# Patient Record
Sex: Female | Born: 1985 | Race: Black or African American | Hispanic: No | Marital: Married | Smoking: Former smoker
Health system: Southern US, Community
[De-identification: ages and names within clinical notes are randomized; demographics above are authoritative.]

## PROBLEM LIST (undated history)

## (undated) DIAGNOSIS — F32A Depression, unspecified: Secondary | ICD-10-CM

## (undated) DIAGNOSIS — D75A Glucose-6-phosphate dehydrogenase (G6PD) deficiency without anemia: Secondary | ICD-10-CM

## (undated) DIAGNOSIS — R51 Headache: Secondary | ICD-10-CM

## (undated) DIAGNOSIS — F4310 Post-traumatic stress disorder, unspecified: Secondary | ICD-10-CM

## (undated) DIAGNOSIS — F419 Anxiety disorder, unspecified: Secondary | ICD-10-CM

## (undated) DIAGNOSIS — G4730 Sleep apnea, unspecified: Secondary | ICD-10-CM

## (undated) DIAGNOSIS — K5790 Diverticulosis of intestine, part unspecified, without perforation or abscess without bleeding: Secondary | ICD-10-CM

## (undated) DIAGNOSIS — J45909 Unspecified asthma, uncomplicated: Secondary | ICD-10-CM

## (undated) DIAGNOSIS — Z8042 Family history of malignant neoplasm of prostate: Secondary | ICD-10-CM

## (undated) DIAGNOSIS — F329 Major depressive disorder, single episode, unspecified: Secondary | ICD-10-CM

## (undated) DIAGNOSIS — K589 Irritable bowel syndrome without diarrhea: Secondary | ICD-10-CM

## (undated) DIAGNOSIS — J209 Acute bronchitis, unspecified: Secondary | ICD-10-CM

## (undated) DIAGNOSIS — D497 Neoplasm of unspecified behavior of endocrine glands and other parts of nervous system: Secondary | ICD-10-CM

## (undated) DIAGNOSIS — D573 Sickle-cell trait: Secondary | ICD-10-CM

## (undated) DIAGNOSIS — R0602 Shortness of breath: Secondary | ICD-10-CM

## (undated) DIAGNOSIS — D649 Anemia, unspecified: Secondary | ICD-10-CM

## (undated) DIAGNOSIS — Z8489 Family history of other specified conditions: Secondary | ICD-10-CM

## (undated) DIAGNOSIS — D571 Sickle-cell disease without crisis: Secondary | ICD-10-CM

## (undated) DIAGNOSIS — D759 Disease of blood and blood-forming organs, unspecified: Secondary | ICD-10-CM

## (undated) DIAGNOSIS — Z803 Family history of malignant neoplasm of breast: Secondary | ICD-10-CM

## (undated) HISTORY — DX: Family history of malignant neoplasm of breast: Z80.3

## (undated) HISTORY — PX: TUMOR REMOVAL: SHX12

## (undated) HISTORY — DX: Family history of malignant neoplasm of prostate: Z80.42

## (undated) HISTORY — DX: Irritable bowel syndrome, unspecified: K58.9

## (undated) HISTORY — PX: EYE SURGERY: SHX253

## (undated) HISTORY — PX: BREAST SURGERY: SHX581

## (undated) HISTORY — PX: REDUCTION MAMMAPLASTY: SUR839

---

## 2010-08-04 DIAGNOSIS — D75A Glucose-6-phosphate dehydrogenase (G6PD) deficiency without anemia: Secondary | ICD-10-CM | POA: Insufficient documentation

## 2013-10-01 ENCOUNTER — Encounter (HOSPITAL_COMMUNITY): Payer: Self-pay | Admitting: Pharmacy Technician

## 2013-10-02 ENCOUNTER — Encounter (HOSPITAL_COMMUNITY): Payer: Self-pay

## 2013-10-02 ENCOUNTER — Encounter (HOSPITAL_COMMUNITY)
Admission: RE | Admit: 2013-10-02 | Discharge: 2013-10-02 | Disposition: A | Discharge status: Home / Self Care | Payer: Medicaid Other | Source: Ambulatory Visit | Attending: Otolaryngology | Admitting: Otolaryngology

## 2013-10-02 ENCOUNTER — Ambulatory Visit (HOSPITAL_COMMUNITY)
Admission: RE | Admit: 2013-10-02 | Discharge: 2013-10-02 | Disposition: A | Discharge status: Home / Self Care | Payer: Medicaid Other | Source: Ambulatory Visit | Attending: Anesthesiology | Admitting: Anesthesiology

## 2013-10-02 DIAGNOSIS — F411 Generalized anxiety disorder: Secondary | ICD-10-CM | POA: Diagnosis not present

## 2013-10-02 DIAGNOSIS — F329 Major depressive disorder, single episode, unspecified: Secondary | ICD-10-CM | POA: Diagnosis not present

## 2013-10-02 DIAGNOSIS — Z87891 Personal history of nicotine dependence: Secondary | ICD-10-CM | POA: Diagnosis not present

## 2013-10-02 DIAGNOSIS — Z01818 Encounter for other preprocedural examination: Secondary | ICD-10-CM | POA: Diagnosis not present

## 2013-10-02 DIAGNOSIS — J45909 Unspecified asthma, uncomplicated: Secondary | ICD-10-CM | POA: Insufficient documentation

## 2013-10-02 DIAGNOSIS — F3289 Other specified depressive episodes: Secondary | ICD-10-CM | POA: Diagnosis not present

## 2013-10-02 DIAGNOSIS — D571 Sickle-cell disease without crisis: Secondary | ICD-10-CM | POA: Diagnosis not present

## 2013-10-02 DIAGNOSIS — F4310 Post-traumatic stress disorder, unspecified: Secondary | ICD-10-CM | POA: Insufficient documentation

## 2013-10-02 HISTORY — DX: Anxiety disorder, unspecified: F41.9

## 2013-10-02 HISTORY — DX: Depression, unspecified: F32.A

## 2013-10-02 HISTORY — DX: Acute bronchitis, unspecified: J20.9

## 2013-10-02 HISTORY — DX: Diverticulosis of intestine, part unspecified, without perforation or abscess without bleeding: K57.90

## 2013-10-02 HISTORY — DX: Unspecified asthma, uncomplicated: J45.909

## 2013-10-02 HISTORY — DX: Anemia, unspecified: D64.9

## 2013-10-02 HISTORY — DX: Family history of other specified conditions: Z84.89

## 2013-10-02 HISTORY — DX: Major depressive disorder, single episode, unspecified: F32.9

## 2013-10-02 HISTORY — DX: Headache: R51

## 2013-10-02 HISTORY — DX: Sleep apnea, unspecified: G47.30

## 2013-10-02 HISTORY — DX: Post-traumatic stress disorder, unspecified: F43.10

## 2013-10-02 HISTORY — DX: Shortness of breath: R06.02

## 2013-10-02 HISTORY — DX: Disease of blood and blood-forming organs, unspecified: D75.9

## 2013-10-02 LAB — CBC
HCT: 37.1 % (ref 36.0–46.0)
Hemoglobin: 12.7 g/dL (ref 12.0–15.0)
MCH: 29.5 pg (ref 26.0–34.0)
MCHC: 34.2 g/dL (ref 30.0–36.0)
MCV: 86.1 fL (ref 78.0–100.0)
PLATELETS: 278 10*3/uL (ref 150–400)
RBC: 4.31 MIL/uL (ref 3.87–5.11)
RDW: 12.6 % (ref 11.5–15.5)
WBC: 7.1 10*3/uL (ref 4.0–10.5)

## 2013-10-02 LAB — HCG, SERUM, QUALITATIVE: Preg, Serum: NEGATIVE

## 2013-10-02 LAB — BASIC METABOLIC PANEL
Anion gap: 15 (ref 5–15)
BUN: 11 mg/dL (ref 6–23)
CALCIUM: 9.7 mg/dL (ref 8.4–10.5)
CO2: 22 meq/L (ref 19–32)
CREATININE: 0.83 mg/dL (ref 0.50–1.10)
Chloride: 102 mEq/L (ref 96–112)
GFR calc Af Amer: >90 mL/min (ref >90)
GFR calc non Af Amer: >90 mL/min (ref >90)
Glucose, Bld: 94 mg/dL (ref 70–99)
Potassium: 4.2 mEq/L (ref 3.7–5.3)
Sodium: 139 mEq/L (ref 137–147)

## 2013-10-02 IMAGING — CR DG CHEST 2V
2 series · 2 of 2 positions shown · non-contrast
Comparison: None.

CLINICAL DATA: Pre-op for tonsillectomy. History of sickle cell
anemia and asthma.

EXAM:
CHEST  2 VIEW

[w chest pa]
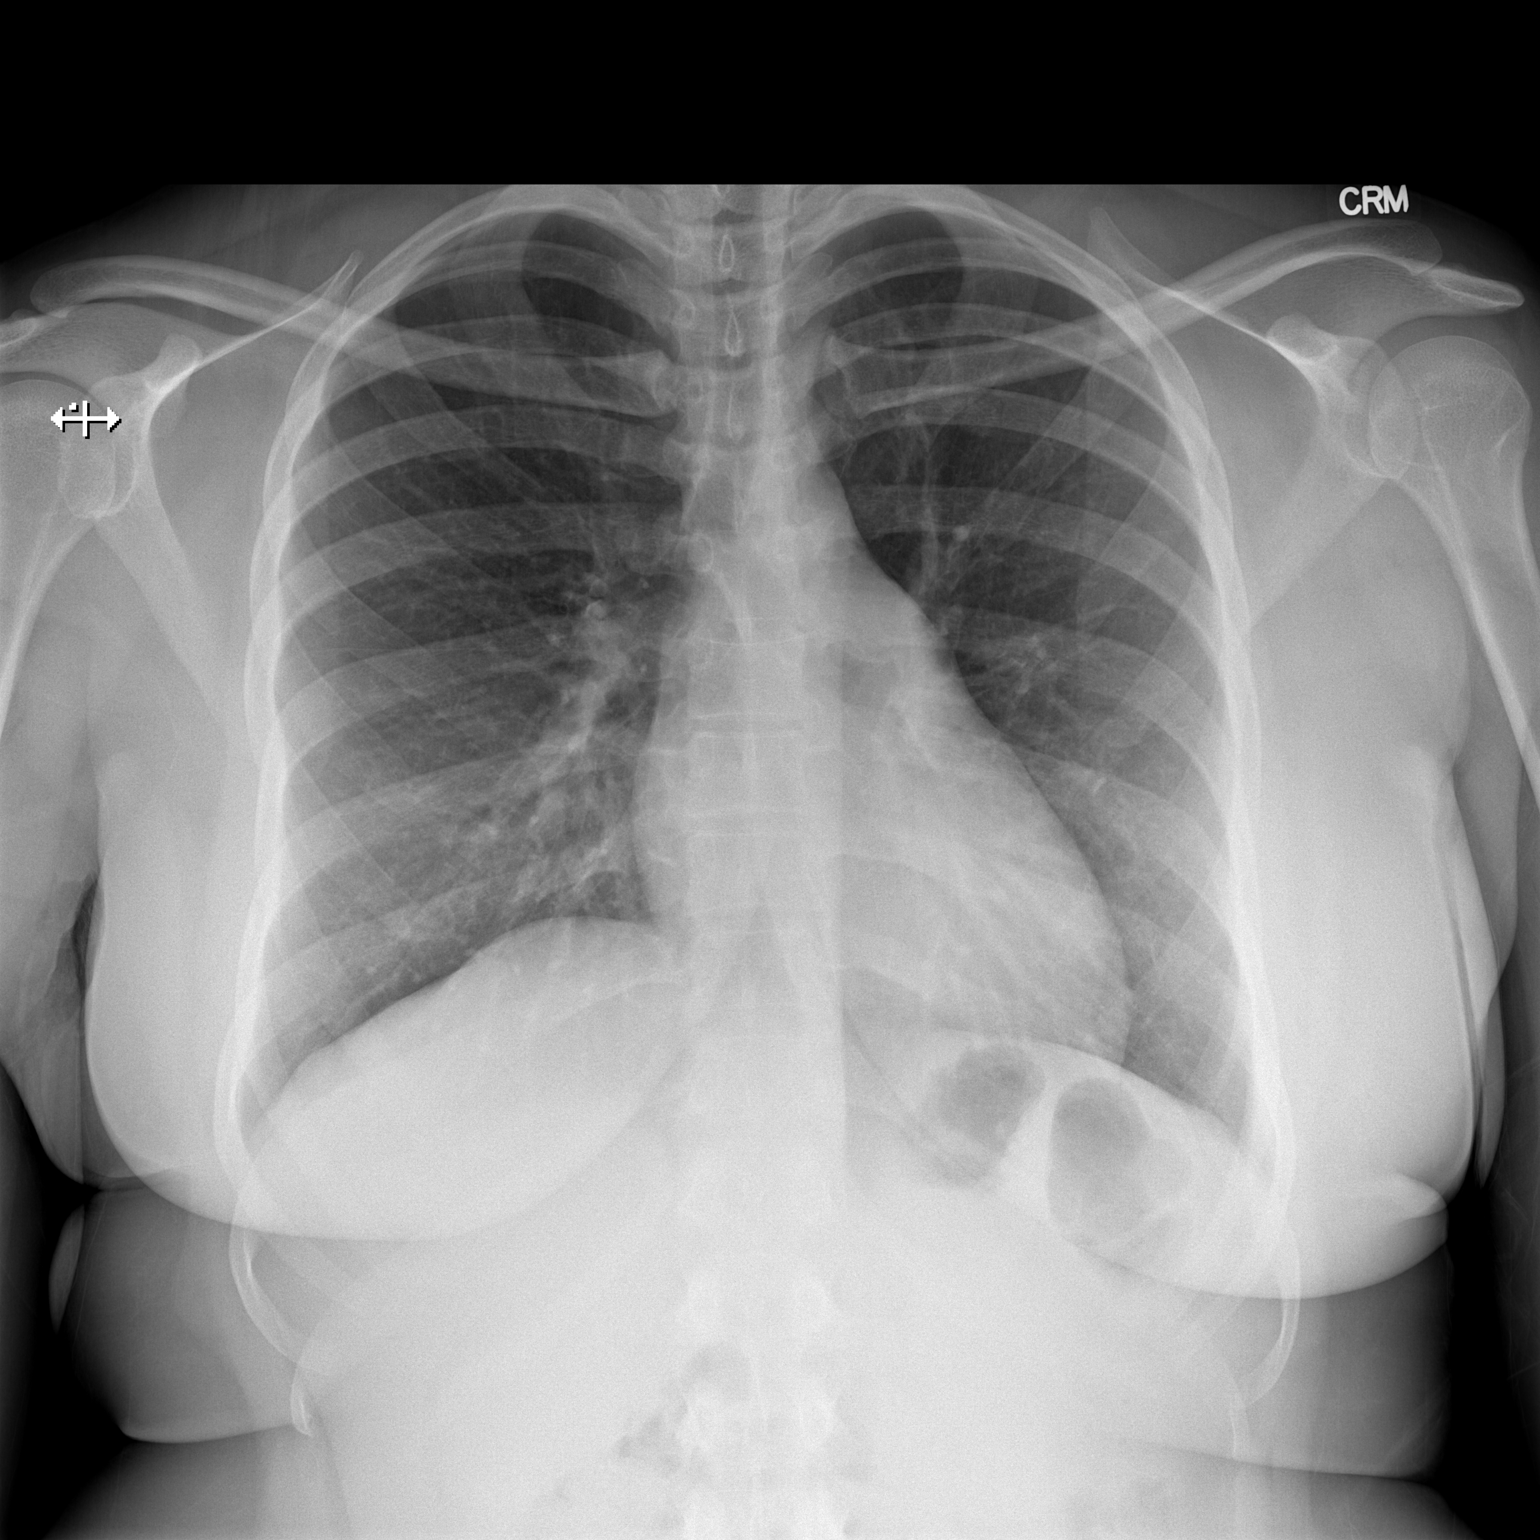

[w chest lat]
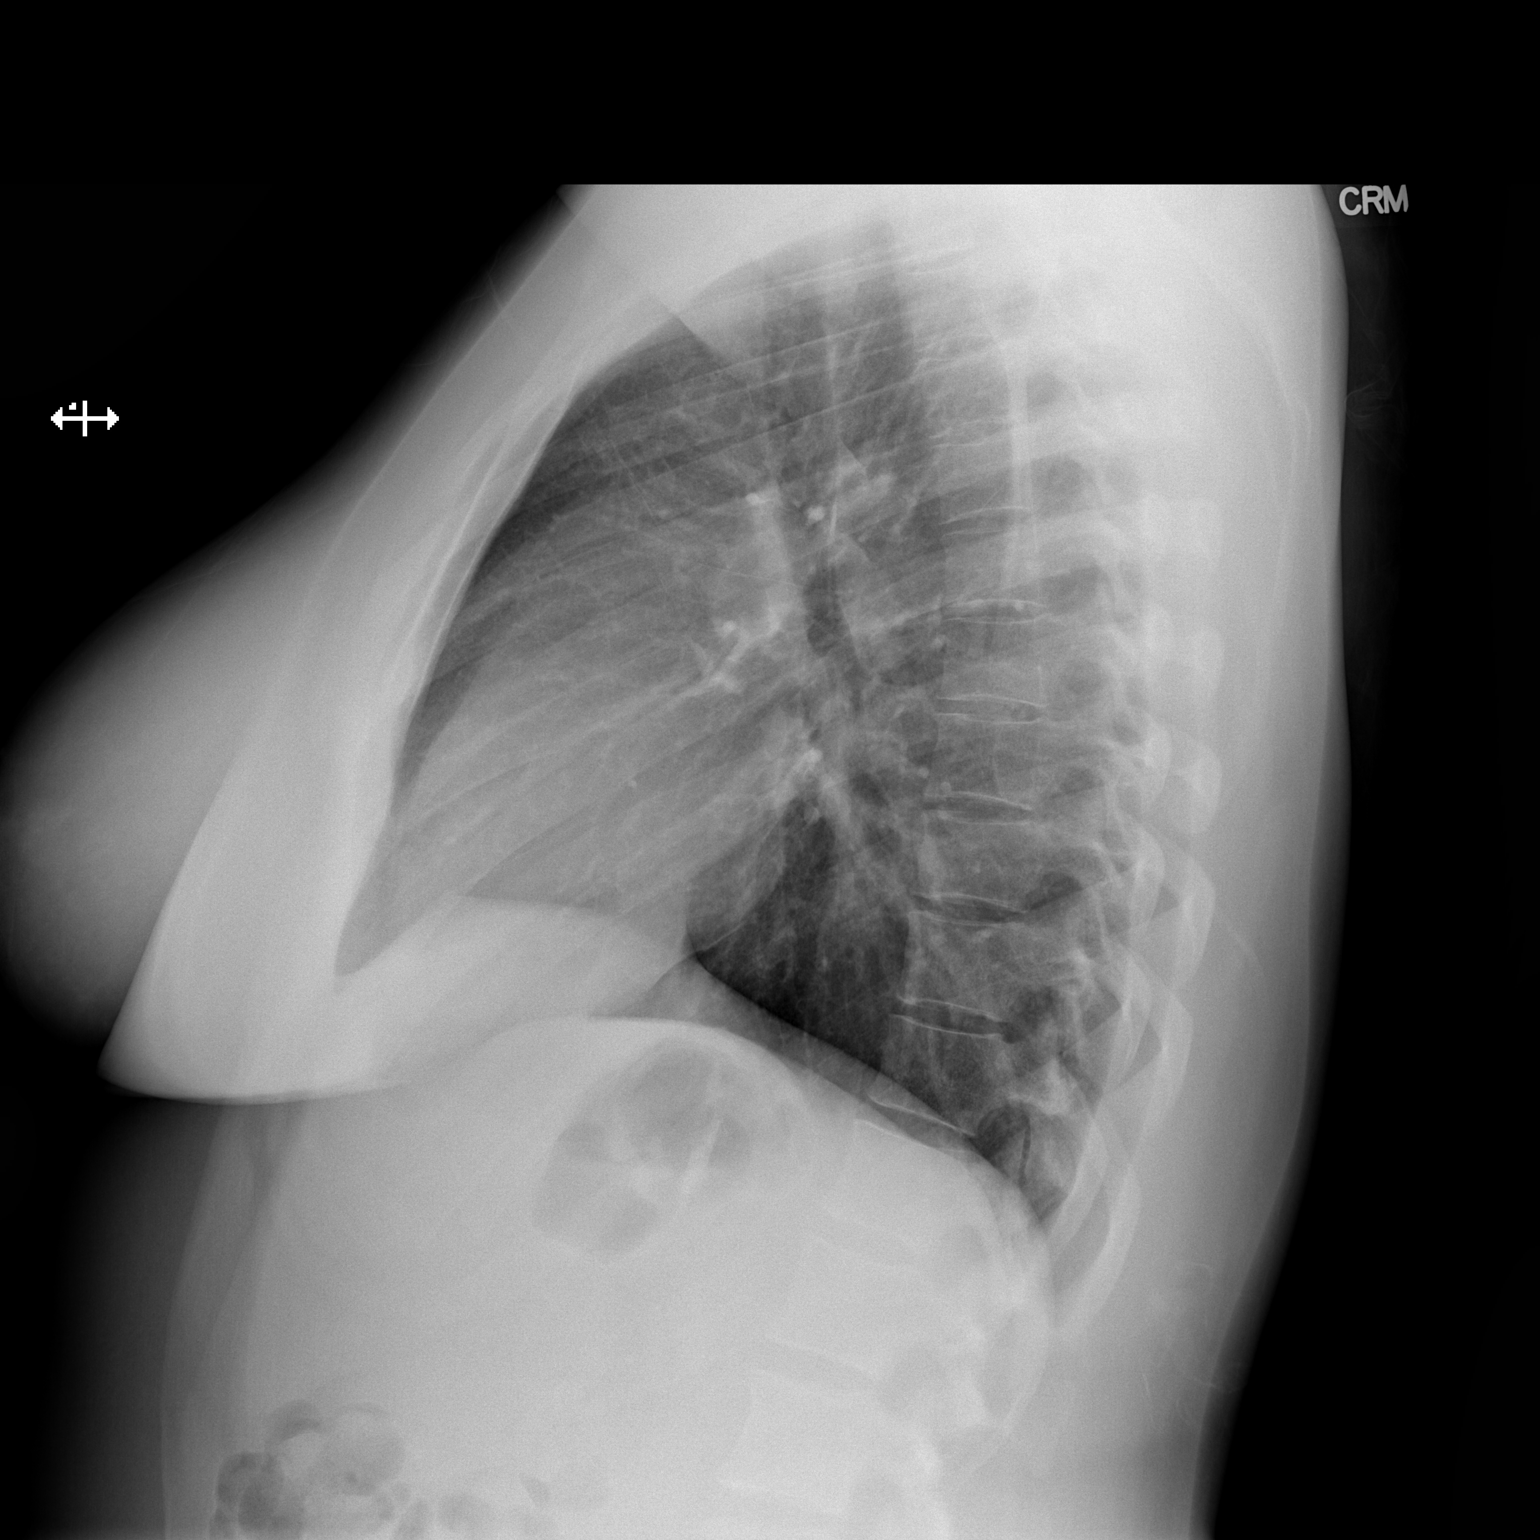

[2 of 2 positions shown; findings below may reference images not displayed]

FINDINGS: The heart size and mediastinal contours are normal. The lungs are
clear. There is no pleural effusion or pneumothorax. No acute
osseous findings are identified.
IMPRESSION: No active cardiopulmonary process.

## 2013-10-02 NOTE — Progress Notes (Signed)
Dr Jenne PaneBates office called for orders to be put in Epic for patient's PAT appointment.

## 2013-10-02 NOTE — Pre-Procedure Instructions (Signed)
Leah Shea  10/02/2013   Your procedure is scheduled on:  Monday August 24 th at 0730AM  Report to [facility] Admitting at 0530 AM.  Call this number if you have problems the morning of surgery: [phone]   Remember:   Do not eat food or drink liquids after midnight.   Take these medicines the morning of surgery with A SIP OF WATER:None. May use inhaler if needed and bring with you day of surgery.   Do not wear jewelry, make-up or nail polish.  Do not wear lotions, powders, or perfumes. You may wear deodorant.  Do not shave 48 hours prior to surgery.   Do not bring valuables to the hospital.  [facility] is not responsible for any belongings or valuables.               Contacts, dentures or bridgework may not be worn into surgery.  Leave suitcase in the car. After surgery it may be brought to your room.  For patients admitted to the hospital, discharge time is determined by your treatment team.               Patients discharged the day of surgery will not be allowed to drive home.    Special Instructions: [place] - Preparing for Surgery  Before surgery, you can play an important role.  Because skin is not sterile, your skin needs to be as free of germs as possible.  You can reduce the number of germs on you skin by washing with CHG (chlorahexidine gluconate) soap before surgery.  CHG is an antiseptic cleaner which kills germs and bonds with the skin to continue killing germs even after washing.  Please DO NOT use if you have an allergy to CHG or antibacterial soaps.  If your skin becomes reddened/irritated stop using the CHG and inform your nurse when you arrive at Short Stay.  Do not shave (including legs and underarms) for at least 48 hours prior to the first CHG shower.  You may shave your face.  Please follow these instructions carefully:   1.  Shower with CHG Soap the night before surgery and the                                morning of Surgery.  2.  If  you choose to wash your hair, wash your hair first as usual with your       normal shampoo.  3.  After you shampoo, rinse your hair and body thoroughly to remove the                      Shampoo.  4.  Use CHG as you would any other liquid soap.  You can apply chg directly       to the skin and wash gently with scrungie or a clean washcloth.  5.  Apply the CHG Soap to your body ONLY FROM THE NECK DOWN.        Do not use on open wounds or open sores.  Avoid contact with your eyes,       ears, mouth and genitals (private parts).  Wash genitals (private parts)       with your normal soap.  6.  Wash thoroughly, paying special attention to the area where your surgery        will be performed.  7.  Thoroughly rinse your  body with warm water from the neck down.  8.  DO NOT shower/wash with your normal soap after using and rinsing off       the CHG Soap.  9.  Pat yourself dry with a clean towel.            10.  Wear clean pajamas.            11.  Place clean sheets on your bed the night of your first shower and do not        sleep with pets.  Day of Surgery  Do not apply any lotions/deoderants the morning of surgery.  Please wear clean clothes to the hospital/surgery center.      Please read over the following fact sheets that you were given: Pain Booklet, Coughing and Deep Breathing and Surgical Site Infection Prevention

## 2013-10-03 NOTE — Progress Notes (Signed)
Anesthesia Chart Review:  Patient is a 28 year old female scheduled for tonsillectomy on 10/07/13 by Dr. Jenne PaneBates.  History includes former smoker (quit 07/02/13), asthma, suspected OSA (waiting to have sleep study [facility]), anxiety, depression, PTSD, Sickle cell trait, anemia, migraine headaches, bilateral breast reductions. She reported her son had anesthesia complications due to his OSA. BMI is consistent with obesity.  EKG on 10/02/13 showed: NSR, cannot rule out anterior infarct (age undetermined). Comparison EKG in care anywhere from 10/28/12 showed normal sinus rhythm with sinus arrhythmia, nonspecific T wave abnormality. Actual tracing requested, but is still pending. No CV symptoms documented from her PAT visit. No reported CAD, CHF, or diabetes history.  Echo on 10/27/12 (Care Everywhere) showed: Interpretation Summary: The left ventricle is normal in size, wall thickness and wall motion with ejection fraction of 55-60%. The left ventricular diastolic function is normal. The left atrium is mildly dilated. There is mild (1+) tricuspid regurgitation. Right ventricular systolic pressure is normal. There is mild (1+) pulmonic valvular regurgitation. There is no pericardial effusion.  Preoperative CXR and labs noted.   She will be further evaluated by her assigned anesthesiologist on the day of surgery, but no acute changes then I would anticipate that she could proceed as planned.  Velna Ochsllison Zelenak, PA-C [facility]/Anesthesiology Phone [phone] 10/03/2013 12:11 PM

## 2013-10-07 ENCOUNTER — Encounter (HOSPITAL_COMMUNITY)
Admission: RE | Disposition: A | Discharge status: Home / Self Care | Payer: Self-pay | Source: Ambulatory Visit | Attending: Otolaryngology

## 2013-10-07 ENCOUNTER — Observation Stay (HOSPITAL_COMMUNITY)
Admission: RE | Admit: 2013-10-07 | Discharge: 2013-10-09 | Disposition: A | Discharge status: Home / Self Care | Payer: Medicaid Other | Source: Ambulatory Visit | Attending: Otolaryngology | Admitting: Otolaryngology

## 2013-10-07 ENCOUNTER — Encounter (HOSPITAL_COMMUNITY): Payer: Medicaid Other | Admitting: Vascular Surgery

## 2013-10-07 ENCOUNTER — Encounter (HOSPITAL_COMMUNITY): Payer: Self-pay | Admitting: Surgery

## 2013-10-07 ENCOUNTER — Ambulatory Visit (HOSPITAL_COMMUNITY): Payer: Medicaid Other | Admitting: Anesthesiology

## 2013-10-07 DIAGNOSIS — F329 Major depressive disorder, single episode, unspecified: Secondary | ICD-10-CM | POA: Diagnosis not present

## 2013-10-07 DIAGNOSIS — Z91018 Allergy to other foods: Secondary | ICD-10-CM | POA: Insufficient documentation

## 2013-10-07 DIAGNOSIS — F3289 Other specified depressive episodes: Secondary | ICD-10-CM | POA: Insufficient documentation

## 2013-10-07 DIAGNOSIS — G4730 Sleep apnea, unspecified: Secondary | ICD-10-CM | POA: Diagnosis not present

## 2013-10-07 DIAGNOSIS — Z888 Allergy status to other drugs, medicaments and biological substances status: Secondary | ICD-10-CM | POA: Insufficient documentation

## 2013-10-07 DIAGNOSIS — J45909 Unspecified asthma, uncomplicated: Secondary | ICD-10-CM | POA: Diagnosis not present

## 2013-10-07 DIAGNOSIS — G43909 Migraine, unspecified, not intractable, without status migrainosus: Secondary | ICD-10-CM | POA: Insufficient documentation

## 2013-10-07 DIAGNOSIS — F411 Generalized anxiety disorder: Secondary | ICD-10-CM | POA: Insufficient documentation

## 2013-10-07 DIAGNOSIS — J3501 Chronic tonsillitis: Secondary | ICD-10-CM | POA: Diagnosis not present

## 2013-10-07 DIAGNOSIS — F4310 Post-traumatic stress disorder, unspecified: Secondary | ICD-10-CM | POA: Insufficient documentation

## 2013-10-07 DIAGNOSIS — J351 Hypertrophy of tonsils: Secondary | ICD-10-CM | POA: Diagnosis present

## 2013-10-07 DIAGNOSIS — D573 Sickle-cell trait: Secondary | ICD-10-CM | POA: Insufficient documentation

## 2013-10-07 HISTORY — PX: TONSILLECTOMY: SHX5217

## 2013-10-07 SURGERY — TONSILLECTOMY
Anesthesia: General | Site: Mouth

## 2013-10-07 MED ORDER — EPHEDRINE SULFATE 50 MG/ML IJ SOLN
INTRAMUSCULAR | Status: AC
  Filled 2013-10-07: qty 1

## 2013-10-07 MED ORDER — PHENOL 1.4 % MT LIQD
1.0000 | OROMUCOSAL | Status: DC | PRN
  Administered 2013-10-07: 1 via OROMUCOSAL
  Filled 2013-10-07: qty 177

## 2013-10-07 MED ORDER — OXYCODONE HCL 5 MG/5ML PO SOLN
ORAL | Status: AC
  Filled 2013-10-07: qty 5

## 2013-10-07 MED ORDER — HYDROCODONE-ACETAMINOPHEN 7.5-325 MG/15ML PO SOLN
ORAL | Status: AC
  Filled 2013-10-07: qty 15

## 2013-10-07 MED ORDER — PROPOFOL 10 MG/ML IV BOLUS
INTRAVENOUS | Status: AC
  Filled 2013-10-07: qty 20

## 2013-10-07 MED ORDER — KCL IN DEXTROSE-NACL 20-5-0.45 MEQ/L-%-% IV SOLN
INTRAVENOUS | Status: DC
  Administered 2013-10-07: 125 mL via INTRAVENOUS
  Administered 2013-10-07 – 2013-10-09 (×5): via INTRAVENOUS
  Filled 2013-10-07 (×10): qty 1000

## 2013-10-07 MED ORDER — ONDANSETRON HCL 4 MG/2ML IJ SOLN
INTRAMUSCULAR | Status: DC | PRN
  Administered 2013-10-07: 4 mg via INTRAVENOUS

## 2013-10-07 MED ORDER — PROMETHAZINE HCL 25 MG RE SUPP
12.5000 mg | Freq: Four times a day (QID) | RECTAL | Status: DC | PRN
  Administered 2013-10-07: 12.5 mg via RECTAL
  Filled 2013-10-07: qty 1

## 2013-10-07 MED ORDER — PROMETHAZINE HCL 25 MG/ML IJ SOLN
12.5000 mg | Freq: Four times a day (QID) | INTRAMUSCULAR | Status: DC | PRN
  Administered 2013-10-07 – 2013-10-09 (×3): 12.5 mg via INTRAVENOUS
  Filled 2013-10-07 (×3): qty 1

## 2013-10-07 MED ORDER — 0.9 % SODIUM CHLORIDE (POUR BTL) OPTIME
TOPICAL | Status: DC | PRN
  Administered 2013-10-07: 1000 mL

## 2013-10-07 MED ORDER — ALBUTEROL SULFATE (2.5 MG/3ML) 0.083% IN NEBU: 3.0000 mL | INHALATION_SOLUTION | Freq: Four times a day (QID) | RESPIRATORY_TRACT | Status: DC | PRN

## 2013-10-07 MED ORDER — SODIUM CHLORIDE 0.9 % IJ SOLN
INTRAMUSCULAR | Status: AC
  Filled 2013-10-07: qty 10

## 2013-10-07 MED ORDER — HYDROMORPHONE HCL PF 1 MG/ML IJ SOLN
INTRAMUSCULAR | Status: AC
  Filled 2013-10-07: qty 1

## 2013-10-07 MED ORDER — MEPERIDINE HCL 25 MG/ML IJ SOLN
INTRAMUSCULAR | Status: AC
  Administered 2013-10-07: 12.5 mg
  Filled 2013-10-07: qty 1

## 2013-10-07 MED ORDER — OXYCODONE HCL 5 MG PO TABS: 5.0000 mg | ORAL_TABLET | Freq: Once | ORAL | Status: AC | PRN

## 2013-10-07 MED ORDER — FENTANYL CITRATE 0.05 MG/ML IJ SOLN
INTRAMUSCULAR | Status: AC
  Filled 2013-10-07: qty 5

## 2013-10-07 MED ORDER — OXYCODONE HCL 5 MG/5ML PO SOLN
5.0000 mg | Freq: Once | ORAL | Status: AC | PRN
  Administered 2013-10-07: 5 mg via ORAL

## 2013-10-07 MED ORDER — LACTATED RINGERS IV SOLN
INTRAVENOUS | Status: DC | PRN
  Administered 2013-10-07: 07:00:00 via INTRAVENOUS

## 2013-10-07 MED ORDER — MORPHINE SULFATE 2 MG/ML IJ SOLN
INTRAMUSCULAR | Status: AC
  Filled 2013-10-07: qty 1

## 2013-10-07 MED ORDER — MORPHINE SULFATE 2 MG/ML IJ SOLN
2.0000 mg | INTRAMUSCULAR | Status: DC | PRN
  Administered 2013-10-07: 4 mg via INTRAVENOUS
  Administered 2013-10-07: 2 mg via INTRAVENOUS
  Administered 2013-10-07: 4 mg via INTRAVENOUS
  Administered 2013-10-07: 2 mg via INTRAVENOUS
  Administered 2013-10-07: 4 mg via INTRAVENOUS
  Administered 2013-10-08 (×3): 2 mg via INTRAVENOUS
  Administered 2013-10-08: 4 mg via INTRAVENOUS
  Administered 2013-10-09 (×3): 2 mg via INTRAVENOUS
  Filled 2013-10-07: qty 2
  Filled 2013-10-07 (×3): qty 1
  Filled 2013-10-07 (×2): qty 2
  Filled 2013-10-07 (×2): qty 1
  Filled 2013-10-07: qty 2
  Filled 2013-10-07 (×2): qty 1

## 2013-10-07 MED ORDER — KCL IN DEXTROSE-NACL 20-5-0.45 MEQ/L-%-% IV SOLN
INTRAVENOUS | Status: AC
  Filled 2013-10-07: qty 1000

## 2013-10-07 MED ORDER — SCOPOLAMINE 1 MG/3DAYS TD PT72
MEDICATED_PATCH | TRANSDERMAL | Status: DC | PRN
  Administered 2013-10-07: 1 via TRANSDERMAL

## 2013-10-07 MED ORDER — OXYCODONE HCL 5 MG/5ML PO SOLN
5.0000 mg | ORAL | Status: DC | PRN
  Administered 2013-10-07 – 2013-10-08 (×3): 5 mg via ORAL
  Filled 2013-10-07 (×3): qty 5

## 2013-10-07 MED ORDER — HYDROCODONE-ACETAMINOPHEN 7.5-325 MG/15ML PO SOLN
10.0000 mL | ORAL | Status: DC | PRN
  Administered 2013-10-07 (×3): 15 mL via ORAL
  Filled 2013-10-07 (×2): qty 15

## 2013-10-07 MED ORDER — MORPHINE SULFATE 2 MG/ML IJ SOLN
INTRAMUSCULAR | Status: AC
  Administered 2013-10-07: 2 mg via INTRAVENOUS
  Filled 2013-10-07: qty 1

## 2013-10-07 MED ORDER — MIDAZOLAM HCL 2 MG/2ML IJ SOLN
INTRAMUSCULAR | Status: AC
  Filled 2013-10-07: qty 2

## 2013-10-07 MED ORDER — PHENYLEPHRINE 40 MCG/ML (10ML) SYRINGE FOR IV PUSH (FOR BLOOD PRESSURE SUPPORT)
PREFILLED_SYRINGE | INTRAVENOUS | Status: AC
  Filled 2013-10-07: qty 10

## 2013-10-07 MED ORDER — DEXAMETHASONE SODIUM PHOSPHATE 10 MG/ML IJ SOLN
INTRAMUSCULAR | Status: DC | PRN
  Administered 2013-10-07: 10 mg via INTRAVENOUS

## 2013-10-07 MED ORDER — PROMETHAZINE HCL 25 MG/ML IJ SOLN
6.2500 mg | INTRAMUSCULAR | Status: DC | PRN
  Administered 2013-10-07: 6.25 mg via INTRAVENOUS

## 2013-10-07 MED ORDER — LORAZEPAM 2 MG/ML IJ SOLN
1.0000 mg | Freq: Four times a day (QID) | INTRAMUSCULAR | Status: DC | PRN
  Administered 2013-10-07 – 2013-10-09 (×7): 1 mg via INTRAVENOUS
  Filled 2013-10-07 (×7): qty 1

## 2013-10-07 MED ORDER — FENTANYL CITRATE 0.05 MG/ML IJ SOLN
INTRAMUSCULAR | Status: DC | PRN
  Administered 2013-10-07: 50 ug via INTRAVENOUS
  Administered 2013-10-07 (×3): 100 ug via INTRAVENOUS
  Administered 2013-10-07: 50 ug via INTRAVENOUS

## 2013-10-07 MED ORDER — LIDOCAINE HCL (CARDIAC) 20 MG/ML IV SOLN
INTRAVENOUS | Status: DC | PRN
  Administered 2013-10-07: 100 mg via INTRAVENOUS

## 2013-10-07 MED ORDER — LIDOCAINE HCL (CARDIAC) 20 MG/ML IV SOLN
INTRAVENOUS | Status: AC
  Filled 2013-10-07: qty 5

## 2013-10-07 MED ORDER — SUCCINYLCHOLINE CHLORIDE 20 MG/ML IJ SOLN
INTRAMUSCULAR | Status: AC
  Filled 2013-10-07: qty 1

## 2013-10-07 MED ORDER — ONDANSETRON HCL 4 MG/2ML IJ SOLN
INTRAMUSCULAR | Status: AC
  Filled 2013-10-07: qty 2

## 2013-10-07 MED ORDER — MIDAZOLAM HCL 5 MG/5ML IJ SOLN
INTRAMUSCULAR | Status: DC | PRN
  Administered 2013-10-07: 2 mg via INTRAVENOUS

## 2013-10-07 MED ORDER — LORAZEPAM 2 MG/ML IJ SOLN: 1.0000 mg | Freq: Four times a day (QID) | INTRAMUSCULAR | Status: DC

## 2013-10-07 MED ORDER — PROMETHAZINE HCL 25 MG/ML IJ SOLN
INTRAMUSCULAR | Status: AC
  Filled 2013-10-07: qty 1

## 2013-10-07 MED ORDER — SUCCINYLCHOLINE CHLORIDE 20 MG/ML IJ SOLN
INTRAMUSCULAR | Status: DC | PRN
  Administered 2013-10-07: 120 mg via INTRAVENOUS

## 2013-10-07 MED ORDER — PROMETHAZINE HCL 25 MG PO TABS
12.5000 mg | ORAL_TABLET | Freq: Four times a day (QID) | ORAL | Status: DC | PRN
  Filled 2013-10-07: qty 1

## 2013-10-07 MED ORDER — PROPOFOL 10 MG/ML IV BOLUS
INTRAVENOUS | Status: DC | PRN
  Administered 2013-10-07: 200 mg via INTRAVENOUS

## 2013-10-07 MED ORDER — HYDROMORPHONE HCL PF 1 MG/ML IJ SOLN
0.2500 mg | INTRAMUSCULAR | Status: DC | PRN
  Administered 2013-10-07 (×4): 0.5 mg via INTRAVENOUS

## 2013-10-07 MED ORDER — ROCURONIUM BROMIDE 50 MG/5ML IV SOLN
INTRAVENOUS | Status: AC
  Filled 2013-10-07: qty 1

## 2013-10-07 SURGICAL SUPPLY — 34 items
CANISTER SUCTION 2500CC (MISCELLANEOUS) ×3 IMPLANT
CATH ROBINSON RED A/P 10FR (CATHETERS) IMPLANT
CLEANER TIP ELECTROSURG 2X2 (MISCELLANEOUS) ×3 IMPLANT
COAGULATOR SUCT 6 FR SWTCH (ELECTROSURGICAL) ×1
COAGULATOR SUCT SWTCH 10FR 6 (ELECTROSURGICAL) ×2 IMPLANT
CRADLE DONUT ADULT HEAD (MISCELLANEOUS) ×3 IMPLANT
ELECT COATED BLADE 2.86 ST (ELECTRODE) ×3 IMPLANT
ELECT REM PT RETURN 9FT ADLT (ELECTROSURGICAL) ×3
ELECT REM PT RETURN 9FT PED (ELECTROSURGICAL)
ELECTRODE REM PT RETRN 9FT PED (ELECTROSURGICAL) IMPLANT
ELECTRODE REM PT RTRN 9FT ADLT (ELECTROSURGICAL) ×1 IMPLANT
GAUZE SPONGE 4X4 16PLY XRAY LF (GAUZE/BANDAGES/DRESSINGS) ×3 IMPLANT
GLOVE BIO SURGEON STRL SZ7.5 (GLOVE) ×3 IMPLANT
GLOVE SURG SS PI 7.0 STRL IVOR (GLOVE) ×3 IMPLANT
GOWN STRL REUS W/ TWL LRG LVL3 (GOWN DISPOSABLE) ×2 IMPLANT
GOWN STRL REUS W/TWL LRG LVL3 (GOWN DISPOSABLE) ×4
KIT BASIN OR (CUSTOM PROCEDURE TRAY) ×3 IMPLANT
KIT ROOM TURNOVER OR (KITS) ×3 IMPLANT
NS IRRIG 1000ML POUR BTL (IV SOLUTION) ×3 IMPLANT
PACK SURGICAL SETUP 50X90 (CUSTOM PROCEDURE TRAY) ×3 IMPLANT
PAD ARMBOARD 7.5X6 YLW CONV (MISCELLANEOUS) ×3 IMPLANT
PENCIL BUTTON HOLSTER BLD 10FT (ELECTRODE) ×3 IMPLANT
SPECIMEN JAR SMALL (MISCELLANEOUS) ×6 IMPLANT
SPONGE TONSIL 1.25 RF SGL STRG (GAUZE/BANDAGES/DRESSINGS) ×3 IMPLANT
SYR BULB 3OZ (MISCELLANEOUS) ×3 IMPLANT
TOWEL OR 17X24 6PK STRL BLUE (TOWEL DISPOSABLE) ×6 IMPLANT
TUBE CONNECTING 12'X1/4 (SUCTIONS) ×1
TUBE CONNECTING 12X1/4 (SUCTIONS) ×2 IMPLANT
TUBE SALEM SUMP 10F W/ARV (TUBING) IMPLANT
TUBE SALEM SUMP 12R W/ARV (TUBING) IMPLANT
TUBE SALEM SUMP 14F W/ARV (TUBING) ×3 IMPLANT
TUBE SALEM SUMP 16 FR W/ARV (TUBING) IMPLANT
WATER STERILE IRR 1000ML POUR (IV SOLUTION) IMPLANT
YANKAUER SUCT BULB TIP NO VENT (SUCTIONS) ×3 IMPLANT

## 2013-10-07 NOTE — H&P (Signed)
Leah Shea is an 28 y.o. female.   Chief Complaint: Tonsillar hypertrophy, chronic tonsillitis HPI: 28 year old female with enlarged tonsils, sleep apnea symptoms, and frequent tonsil infection.  Presents for tonsillectomy.  Past Medical History  Diagnosis Date  . Family history of anesthesia complication     son due sleep apnea  . Sleep apnea     awaiting a sleep study to be done at [place]  . Asthma   . Shortness of breath     with exertion or panic attack  . Bronchitis, acute   . PTSD (post-traumatic stress disorder)   . Anxiety   . Depression   . Diverticular disease   . Headache(784.0)     migraines  . Blood dyscrasia     sickle trait  . Anemia     Past Surgical History  Procedure Laterality Date  . Eye surgery      x 10 as a child  . 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 Cesarean section  2010, 2012,2014    x 3  . Breast surgery Bilateral     reductions    History reviewed. No pertinent family history. Social History:  reports that she quit smoking about 3 months ago. Her smoking use included Cigarettes. She has a .25 pack-year smoking history. She has never used smokeless tobacco. She reports that she drinks alcohol. She reports that she does not use illicit drugs.  Allergies:  Allergies  Allergen Reactions  . Other Anaphylaxis and Hives    Mushrooms  . Nsaids Hives, Swelling and Other (See Comments)    Body burns     Medications Prior to Admission  Medication Sig Dispense Refill  . albuterol (PROVENTIL HFA;VENTOLIN HFA) 108 (90 BASE) MCG/ACT inhaler Inhale 2 puffs into the lungs every 6 (six) hours as needed for wheezing or shortness of breath.        No results found for this or any previous visit (from the past 48 hour(s)). No results found.  Review of Systems  All other systems reviewed and are negative.   Blood pressure 132/65, pulse 81, temperature 98.2 F (36.8 C), temperature source Oral, resp. rate 18, height  (1.676 m), weight  93.781 kg (206 lb 12 oz), last menstrual period 09/21/2013, SpO2 99.00%. Physical Exam  Constitutional: She is oriented to person, place, and time. She appears well-developed and well-nourished. No distress.  HENT:  Head: Normocephalic and atraumatic.  Right Ear: External ear normal.  Left Ear: External ear normal.  Nose: Nose normal.  Mouth/Throat: Oropharynx is clear and moist.  Tonsils 2-3+.  Eyes: Conjunctivae and EOM are normal. Pupils are equal, round, and reactive to light.  Neck: Normal range of motion. Neck supple.  Respiratory: Effort normal.  Musculoskeletal: Normal range of motion.  Neurological: She is alert and oriented to person, place, and time. No cranial nerve deficit.  Skin: Skin is warm and dry.  Psychiatric: She has a normal mood and affect. Her behavior is normal. Judgment and thought content normal.     Assessment/Plan Chronic tonsillitis, tonsillar hypertrophy To OR for tonsillectomy.  Observe overnight due to sleep apnea symptoms and sickle cell anemia.  Leah Shea 10/07/2013, 7:39 AM

## 2013-10-07 NOTE — Progress Notes (Signed)
Taking ice chips well w/o nausea

## 2013-10-07 NOTE — Op Note (Signed)
NAME:  Leah Shea, Leah Shea                ACCOUNT NO.:  [id number]  MEDICAL RECORD NO.:  [id number]  LOCATION:                               FACILITY:  [facility]  PHYSICIAN:  Jaxten Brosh D Maranatha Grossi, MD     DATE OF BIRTH:  08/06/1985  DATE OF PROCEDURE:  10/07/2013 DATE OF DISCHARGE:  10/09/2013                              OPERATIVE REPORT   PREOPERATIVE DIAGNOSIS:  Tonsillar hypertrophy and chronic tonsillitis.  POSTOP DIAGNOSES:  Tonsillar hypertrophy and chronic tonsillitis.  PROCEDURE:  Tonsillectomy.  SURGEON:  Geanine Vandekamp D Electra Paladino, MD  ANESTHESIA:  General endotracheal anesthesia.  COMPLICATIONS:  None.  INDICATION:  The patient is a 27-year-old female with sickle cell anemia, who also has sleep apnea proven by sleep study.  She presents to the operating room for tonsillectomy due to frequent sore throat and sleep apnea symptoms with obstruction related to enlarged tonsils.  She presents to the operating room for surgical management.  FINDINGS:  Tonsils were 2 to 3+.  DESCRIPTION OF PROCEDURE:  The patient was identified in the holding room informed consent having been obtained discussion of risks, benefits, alternatives, the patient was brought to the operative suite, put on the operative table in supine position.  Anesthesia was induced. The patient was intubated by anesthesia team without difficulty.  The patient was given intravenous steroids during the case.  The eyes were taped and closed, and bed was turned 90 degrees from anesthesia.  Head wrap was placed around the patient's head and a Crowe-Davis retractor was inserted in the mouth and opened via the oropharynx.  This was placed in suspension on Mayo stand.  The right tonsil was grasped with a curved Allis and retracted medially while a curvilinear incision was made along the anterior tonsillar pillar using Bovie cautery on a setting of 20.  Dissection continued subcapsular plane until the tonsil was removed.  The same procedure  was then carried out on the opposite side.  Tonsils were passed separately for pathology.  Bleeding was controlled on both sides using suction cautery on a setting of 30. After this was completed, the nose and throat were copiously irrigated with saline and a flexible catheter was passed down the esophagus to suck out the stomach and esophagus. Retractor was taken out of suspension and removed from the patient's mouth.  She was turned back to Anesthesia for wake up, and was extubated, moved to recovery room in stable condition.     Mekel Haverstock D Traves Majchrzak, MD     DDB/MEDQ  D:  10/07/2013  T:  10/07/2013  Job:  [id number] 

## 2013-10-07 NOTE — Transfer of Care (Signed)
Immediate Anesthesia Transfer of Care Note  Patient: Leah Shea  Procedure(s) Performed: Procedure(s): TONSILLECTOMY (N/A)  Patient Location: PACU  Anesthesia Type:General  Level of Consciousness: awake, alert , oriented and patient cooperative  Airway & Oxygen Therapy: Patient Spontanous Breathing  Post-op Assessment: Report given to PACU RN, Post -op Vital signs reviewed and stable and Patient moving all extremities  Post vital signs: Reviewed and stable  Complications: No apparent anesthesia complications

## 2013-10-07 NOTE — Progress Notes (Signed)
Shivering resolved after demerol given

## 2013-10-07 NOTE — Anesthesia Preprocedure Evaluation (Signed)
Anesthesia Evaluation  Patient identified by MRN, date of birth, ID band Patient awake    Reviewed: Allergy & Precautions, H&P , NPO status , Patient's Chart, lab work & pertinent test results  History of Anesthesia Complications (+) Family history of anesthesia reaction  Airway       Dental   Pulmonary shortness of breath and with exertion, asthma , sleep apnea , former smoker,          Cardiovascular negative cardio ROS      Neuro/Psych  Headaches, PSYCHIATRIC DISORDERS Anxiety Depression ptsd    Loud noises a trigger    GI/Hepatic negative GI ROS, Neg liver ROS,   Endo/Other    Renal/GU negative Renal ROS     Musculoskeletal   Abdominal   Peds  Hematology  (+) Sickle cell anemia and anemia ,   Anesthesia Other Findings   Reproductive/Obstetrics                           Anesthesia Physical Anesthesia Plan  ASA: II  Anesthesia Plan: General   Post-op Pain Management:    Induction: Intravenous  Airway Management Planned: Oral ETT  Additional Equipment:   Intra-op Plan:   Post-operative Plan: Extubation in OR  Informed Consent:   Dental advisory given  Plan Discussed with: CRNA, Surgeon and Anesthesiologist  Anesthesia Plan Comments:         Anesthesia Quick Evaluation

## 2013-10-07 NOTE — Progress Notes (Signed)
10/07/2013 6:10 PM  Thressa Sheller, [place] [id number]  Post-Op Check   Temp:  [98.2 F (36.8 C)-98.3 F (36.8 C)] 98.2 F (36.8 C) (08/24 1213) Pulse Rate:  [53-89] 60 (08/24 1213) Resp:  [11-26] 15 (08/24 1213) BP: (119-138)/(65-84) 135/84 mmHg (08/24 1213) SpO2:  [95 %-99 %] 99 % (08/24 1213) Weight:  [206 lb 12 oz (93.781 kg)-210 lb (95.255 kg)] 210 lb (95.255 kg) (08/24 1213),     Intake/Output Summary (Last 24 hours) at 10/07/13 1810 Last data filed at 10/07/13 8295  Gross per 24 hour  Intake    900 ml  Output      0 ml  Net    900 ml   Some early po fluids.  Spont void  No results found for this or any previous visit (from the past 24 hour(s)).  SUBJECTIVE:  Pain medication not working too well.  Breathing OK.  No bleeding.  OBJECTIVE:  Hoarse.  Breathing well.  Fossae large but clean  IMPRESSION:  Satisfactory check  PLAN:  Add back small amt of Oxycodone for pain relief.  Unable to add NSAIDS due to allergy.  Advance po diet and activity.  Anticipate home in AM.  [place], [place]

## 2013-10-07 NOTE — Brief Op Note (Signed)
10/07/2013  8:16 AM  PATIENT:  Leah Shea  28 y.o. female  PRE-OPERATIVE DIAGNOSIS:  tonsillar hypertrophy/chronic tonsillitis  POST-OPERATIVE DIAGNOSIS:  tonsillar hypertrophy/chronic tonsillitis  PROCEDURE:  Procedure(s): TONSILLECTOMY (N/A)  SURGEON:  Surgeon(s) and Role:    * Christia Reading, MD - Primary  PHYSICIAN ASSISTANT:   ASSISTANTS: none   ANESTHESIA:   general  EBL:     BLOOD ADMINISTERED:none  DRAINS: none   LOCAL MEDICATIONS USED:  NONE  SPECIMEN:  Source of Specimen:  right and left tonsils  DISPOSITION OF SPECIMEN:  PATHOLOGY  COUNTS:  YES  TOURNIQUET:  * No tourniquets in log *  DICTATION: .Other Dictation: Dictation Number [id number]  PLAN OF CARE: Admit for overnight observation  PATIENT DISPOSITION:  PACU - hemodynamically stable.   Delay start of Pharmacological VTE agent (>24hrs) due to surgical blood loss or risk of bleeding: yes

## 2013-10-07 NOTE — Anesthesia Postprocedure Evaluation (Signed)
Anesthesia Post Note  Patient: Leah Shea  Procedure(s) Performed: Procedure(s) (LRB): TONSILLECTOMY (N/A)  Anesthesia type: general  Patient location: PACU  Post pain: Pain level controlled  Post assessment: Patient's Cardiovascular Status Stable  Last Vitals:  Filed Vitals:   10/07/13 0900  BP: 126/84  Pulse: 53  Temp:   Resp: 13    Post vital signs: Reviewed and stable  Level of consciousness: sedated  Complications: No apparent anesthesia complications

## 2013-10-07 NOTE — Progress Notes (Signed)
Pt shiving consulted with dr singer med ordered and given

## 2013-10-07 NOTE — Op Note (Deleted)
Leah Shea, Leah Shea NO.:  [id number]  MEDICAL RECORD NO.:  [id number]  LOCATION:                               FACILITY:  [facility]  PHYSICIAN:  Antony Contras, MD     DATE OF BIRTH:  1985-04-17  DATE OF PROCEDURE:  10/07/2013 DATE OF DISCHARGE:  10/09/2013                              OPERATIVE REPORT   PREOPERATIVE DIAGNOSIS:  Tonsillar hypertrophy and chronic tonsillitis.  POSTOP DIAGNOSES:  Tonsillar hypertrophy and chronic tonsillitis.  PROCEDURE:  Tonsillectomy.  SURGEON:  Antony Contras, MD  ANESTHESIA:  General endotracheal anesthesia.  COMPLICATIONS:  None.  INDICATION:  The patient is a 28 year old female with sickle cell anemia, who also has sleep apnea proven by sleep study.  She presents to the operating room for tonsillectomy due to frequent sore throat and sleep apnea symptoms with obstruction related to enlarged tonsils.  She presents to the operating room for surgical management.  FINDINGS:  Tonsils were 2 to 3+.  DESCRIPTION OF PROCEDURE:  The patient was identified in the holding room informed consent having been obtained discussion of risks, benefits, alternatives, the patient was brought to the operative suite, put on the operative table in supine position.  Anesthesia was induced. The patient was intubated by anesthesia team without difficulty.  The patient was given intravenous steroids during the case.  The eyes were taped and closed, and bed was turned 90 degrees from anesthesia.  Head wrap was placed around the patient's head and a Crowe-Davis retractor was inserted in the mouth and opened via the oropharynx.  This was placed in suspension on Mayo stand.  The right tonsil was grasped with a curved Allis and retracted medially while a curvilinear incision was made along the anterior tonsillar pillar using Bovie cautery on a setting of 20.  Dissection continued subcapsular plane until the tonsil was removed.  The same procedure  was then carried out on the opposite side.  Tonsils were passed separately for pathology.  Bleeding was controlled on both sides using suction cautery on a setting of 30. After this was completed, the nose and throat were copiously irrigated with saline and a flexible catheter was passed down the esophagus to suck out the stomach and esophagus. Retractor was taken out of suspension and removed from the patient's mouth.  She was turned back to Anesthesia for wake up, and was extubated, moved to recovery room in stable condition.     Antony Contras, MD     DDB/MEDQ  D:  10/07/2013  T:  10/07/2013  Job:  [id number]

## 2013-10-08 ENCOUNTER — Encounter (HOSPITAL_COMMUNITY): Payer: Self-pay | Admitting: Otolaryngology

## 2013-10-08 DIAGNOSIS — J3501 Chronic tonsillitis: Secondary | ICD-10-CM | POA: Diagnosis not present

## 2013-10-08 MED ORDER — OXYCODONE HCL 5 MG/5ML PO SOLN: 10.0000 mg | ORAL | Status: DC | PRN

## 2013-10-08 MED ORDER — OXYCODONE HCL 5 MG/5ML PO SOLN
10.0000 mg | ORAL | Status: DC | PRN
  Administered 2013-10-08 – 2013-10-09 (×3): 10 mg via ORAL
  Filled 2013-10-08 (×3): qty 10

## 2013-10-08 NOTE — Progress Notes (Signed)
1 Day Post-Op  Subjective: Struggling with throat pain.  Limited po intake.  Objective: Vital signs in last 24 hours: Temp:  [97.9 F (36.6 C)-98.3 F (36.8 C)] 97.9 F (36.6 C) (08/25 0655) Pulse Rate:  [53-89] 88 (08/25 0655) Resp:  [11-26] 16 (08/25 0655) BP: (101-135)/(66-84) 101/73 mmHg (08/25 0655) SpO2:  [95 %-100 %] 100 % (08/25 0655) Weight:  [95.255 kg (210 lb)] 95.255 kg (210 lb) (08/24 1213) Last BM Date: 10/06/13  Intake/Output from previous day: 08/24 0701 - 08/25 0700 In: 900 [I.V.:900] Out: 1200 [Urine:1200] Intake/Output this shift:    General appearance: alert, cooperative and no distress Throat: no bleeding  Lab Results:  No results found for this basename: WBC, HGB, HCT, PLT,  in the last 72 hours BMET No results found for this basename: NA, K, CL, CO2, GLUCOSE, BUN, CREATININE, CALCIUM,  in the last 72 hours PT/INR No results found for this basename: LABPROT, INR,  in the last 72 hours ABG No results found for this basename: PHART, PCO2, PO2, HCO3,  in the last 72 hours  Studies/Results: No results found.  Anti-infectives: Anti-infectives   None      Assessment/Plan: s/p Procedure(s): TONSILLECTOMY (N/A) Not drinking enough yet to be able to go home.  Continue pain control.  She has to be able to maintain hydration by mouth before she can go home, especially in light of her sickle cell anemia.  LOS: 1 day    Leah Shea 10/08/2013

## 2013-10-09 DIAGNOSIS — J3501 Chronic tonsillitis: Secondary | ICD-10-CM | POA: Diagnosis not present

## 2013-10-09 MED ORDER — PROMETHAZINE HCL 12.5 MG RE SUPP: 12.5000 mg | Freq: Four times a day (QID) | RECTAL | Status: DC | PRN

## 2013-10-09 MED ORDER — ALPRAZOLAM 0.5 MG PO TABS: 0.5000 mg | ORAL_TABLET | Freq: Three times a day (TID) | ORAL | Status: DC | PRN

## 2013-10-09 NOTE — Discharge Summary (Signed)
Physician Discharge Summary  Patient ID: Leah Shea MRN: [id number] DOB/AGE: 1985/09/22 28 y.o.  Admit date: 10/07/2013 Discharge date: 10/09/2013  Admission Diagnoses: Tonsillar hypertrophy, chronic tonsillitis  Discharge Diagnoses:  Active Problems:   Tonsillar hypertrophy   Discharged Condition: fair  Hospital Course: 28 year old female with sleep apnea and sickle cell anemia underwent tonsillectomy for obstructive symptoms and chronic tonsillitis.  She was observed overnight after surgery due to sleep apnea and had no breathing problems.  Pain was difficult to control, however, and her pain medicine was increased.  She was observed another night due to poor oral intake.  On POD 2, she feels ready to go home still feeling quite a lot of throat pain that is stimulating anxiety attacks.  She will be discharged with pain, anxiety, and nausea medication.  Consults: None  Significant Diagnostic Studies: None  Treatments: surgery: Tonsillectomy  Discharge Exam: Blood pressure 132/82, pulse 75, temperature 98.8 F (37.1 C), temperature source Oral, resp. rate 14, height  (1.676 m), weight 95.255 kg (210 lb), last menstrual period 09/21/2013, SpO2 99.00%. General appearance: alert, cooperative and no distress Throat: no throat bleeding  Disposition: Final discharge disposition not confirmed  Discharge Instructions   Diet - low sodium heart healthy    Complete by:  As directed      Discharge instructions    Complete by:  As directed   Drink plenty of fluids.  Use pain medicine regularly.  Call if unable to swallow liquids, throat starts bleeding, or high fever.     Increase activity slowly    Complete by:  As directed             Medication List         albuterol 108 (90 BASE) MCG/ACT inhaler  Commonly known as:  PROVENTIL HFA;VENTOLIN HFA  Inhale 2 puffs into the lungs every 6 (six) hours as needed for wheezing or shortness of breath.     ALPRAZolam 0.5 MG tablet   Commonly known as:  XANAX  Take 1 tablet (0.5 mg total) by mouth 3 (three) times daily as needed for anxiety.     oxyCODONE 5 MG/5ML solution  Commonly known as:  ROXICODONE  Take 10 mLs (10 mg total) by mouth every 4 (four) hours as needed for severe pain.     promethazine 12.5 MG suppository  Commonly known as:  PHENERGAN  Place 1 suppository (12.5 mg total) rectally every 6 (six) hours as needed for nausea or vomiting.           Follow-up Information   Follow up with Sophie Tamez, MD. Schedule an appointment as soon as possible for a visit in 4 weeks.   Specialty:  Otolaryngology   Contact information:   [address] [place] [address] [phone]       Signed: Christia Reading 10/09/2013, 8:59 AM

## 2013-10-09 NOTE — Progress Notes (Signed)
Discharge instructions gone over with patient. Home medications gone over. Prescriptions given. Follow up appointment to be made. Signs and symptoms of worsening condition gone over. Diet and activity discussed. Patient verbalized understanding of instructions.

## 2014-03-06 ENCOUNTER — Emergency Department (HOSPITAL_COMMUNITY): Payer: Medicaid Other

## 2014-03-06 ENCOUNTER — Emergency Department (HOSPITAL_COMMUNITY)
Admission: EM | Admit: 2014-03-06 | Discharge: 2014-03-06 | Disposition: A | Discharge status: Home / Self Care | Payer: Medicaid Other | Attending: Emergency Medicine | Admitting: Emergency Medicine

## 2014-03-06 ENCOUNTER — Encounter (HOSPITAL_COMMUNITY): Payer: Self-pay | Admitting: *Deleted

## 2014-03-06 DIAGNOSIS — F419 Anxiety disorder, unspecified: Secondary | ICD-10-CM | POA: Diagnosis not present

## 2014-03-06 DIAGNOSIS — R1084 Generalized abdominal pain: Secondary | ICD-10-CM

## 2014-03-06 DIAGNOSIS — R112 Nausea with vomiting, unspecified: Secondary | ICD-10-CM

## 2014-03-06 DIAGNOSIS — F329 Major depressive disorder, single episode, unspecified: Secondary | ICD-10-CM | POA: Insufficient documentation

## 2014-03-06 DIAGNOSIS — D5700 Hb-SS disease with crisis, unspecified: Secondary | ICD-10-CM

## 2014-03-06 DIAGNOSIS — Z3202 Encounter for pregnancy test, result negative: Secondary | ICD-10-CM | POA: Diagnosis not present

## 2014-03-06 DIAGNOSIS — Z87891 Personal history of nicotine dependence: Secondary | ICD-10-CM | POA: Insufficient documentation

## 2014-03-06 DIAGNOSIS — Z79899 Other long term (current) drug therapy: Secondary | ICD-10-CM | POA: Insufficient documentation

## 2014-03-06 DIAGNOSIS — R079 Chest pain, unspecified: Secondary | ICD-10-CM

## 2014-03-06 DIAGNOSIS — Z8669 Personal history of other diseases of the nervous system and sense organs: Secondary | ICD-10-CM | POA: Insufficient documentation

## 2014-03-06 DIAGNOSIS — Z8719 Personal history of other diseases of the digestive system: Secondary | ICD-10-CM | POA: Diagnosis not present

## 2014-03-06 DIAGNOSIS — M791 Myalgia, unspecified site: Secondary | ICD-10-CM

## 2014-03-06 DIAGNOSIS — J45909 Unspecified asthma, uncomplicated: Secondary | ICD-10-CM | POA: Insufficient documentation

## 2014-03-06 LAB — COMPREHENSIVE METABOLIC PANEL
ALBUMIN: 3.9 g/dL (ref 3.5–5.2)
ALT: 12 U/L (ref 0–35)
AST: 15 U/L (ref 0–37)
Alkaline Phosphatase: 94 U/L (ref 39–117)
Anion gap: 9 (ref 5–15)
BUN: 12 mg/dL (ref 6–23)
CALCIUM: 9.3 mg/dL (ref 8.4–10.5)
CHLORIDE: 107 meq/L (ref 96–112)
CO2: 25 mmol/L (ref 19–32)
Creatinine, Ser: 0.95 mg/dL (ref 0.50–1.10)
GFR, EST NON AFRICAN AMERICAN: 81 mL/min — AB (ref >90)
Glucose, Bld: 100 mg/dL — ABNORMAL HIGH (ref 70–99)
Potassium: 3.8 mmol/L (ref 3.5–5.1)
Sodium: 141 mmol/L (ref 135–145)
Total Bilirubin: 0.4 mg/dL (ref 0.3–1.2)
Total Protein: 7.6 g/dL (ref 6.0–8.3)

## 2014-03-06 LAB — RETICULOCYTES
RBC.: 4.36 MIL/uL (ref 3.87–5.11)
Retic Count, Absolute: 87.2 10*3/uL (ref 19.0–186.0)
Retic Ct Pct: 2 % (ref 0.4–3.1)

## 2014-03-06 LAB — URINALYSIS, ROUTINE W REFLEX MICROSCOPIC
BILIRUBIN URINE: NEGATIVE
GLUCOSE, UA: NEGATIVE mg/dL
Hgb urine dipstick: NEGATIVE
Ketones, ur: NEGATIVE mg/dL
LEUKOCYTES UA: NEGATIVE
Nitrite: NEGATIVE
Protein, ur: NEGATIVE mg/dL
SPECIFIC GRAVITY, URINE: 1.015 (ref 1.005–1.030)
Urobilinogen, UA: 0.2 mg/dL (ref 0.0–1.0)
pH: 5.5 (ref 5.0–8.0)

## 2014-03-06 LAB — CBC WITH DIFFERENTIAL/PLATELET
Basophils Absolute: 0 10*3/uL (ref 0.0–0.1)
Basophils Relative: 1 % (ref 0–1)
Eosinophils Absolute: 0.3 10*3/uL (ref 0.0–0.7)
Eosinophils Relative: 4 % (ref 0–5)
HCT: 36.2 % (ref 36.0–46.0)
Hemoglobin: 12.6 g/dL (ref 12.0–15.0)
Lymphocytes Relative: 42 % (ref 12–46)
Lymphs Abs: 3.2 10*3/uL (ref 0.7–4.0)
MCH: 28.9 pg (ref 26.0–34.0)
MCHC: 34.8 g/dL (ref 30.0–36.0)
MCV: 83 fL (ref 78.0–100.0)
MONO ABS: 0.4 10*3/uL (ref 0.1–1.0)
Monocytes Relative: 5 % (ref 3–12)
Neutro Abs: 3.7 10*3/uL (ref 1.7–7.7)
Neutrophils Relative %: 48 % (ref 43–77)
PLATELETS: 266 10*3/uL (ref 150–400)
RBC: 4.36 MIL/uL (ref 3.87–5.11)
RDW: 12.7 % (ref 11.5–15.5)
WBC: 7.6 10*3/uL (ref 4.0–10.5)

## 2014-03-06 LAB — TROPONIN I: Troponin I: <0.03 ng/mL (ref <0.031)

## 2014-03-06 LAB — PREGNANCY, URINE: Preg Test, Ur: NEGATIVE

## 2014-03-06 LAB — LIPASE, BLOOD: Lipase: 21 U/L (ref 11–59)

## 2014-03-06 IMAGING — CR DG CHEST 2V
2 series · 2 of 2 positions shown · non-contrast
Comparison: [DATE]

CLINICAL DATA: Two day history of chest pain. History of sickle
cell disease

EXAM:
CHEST  2 VIEW

[chest pa]
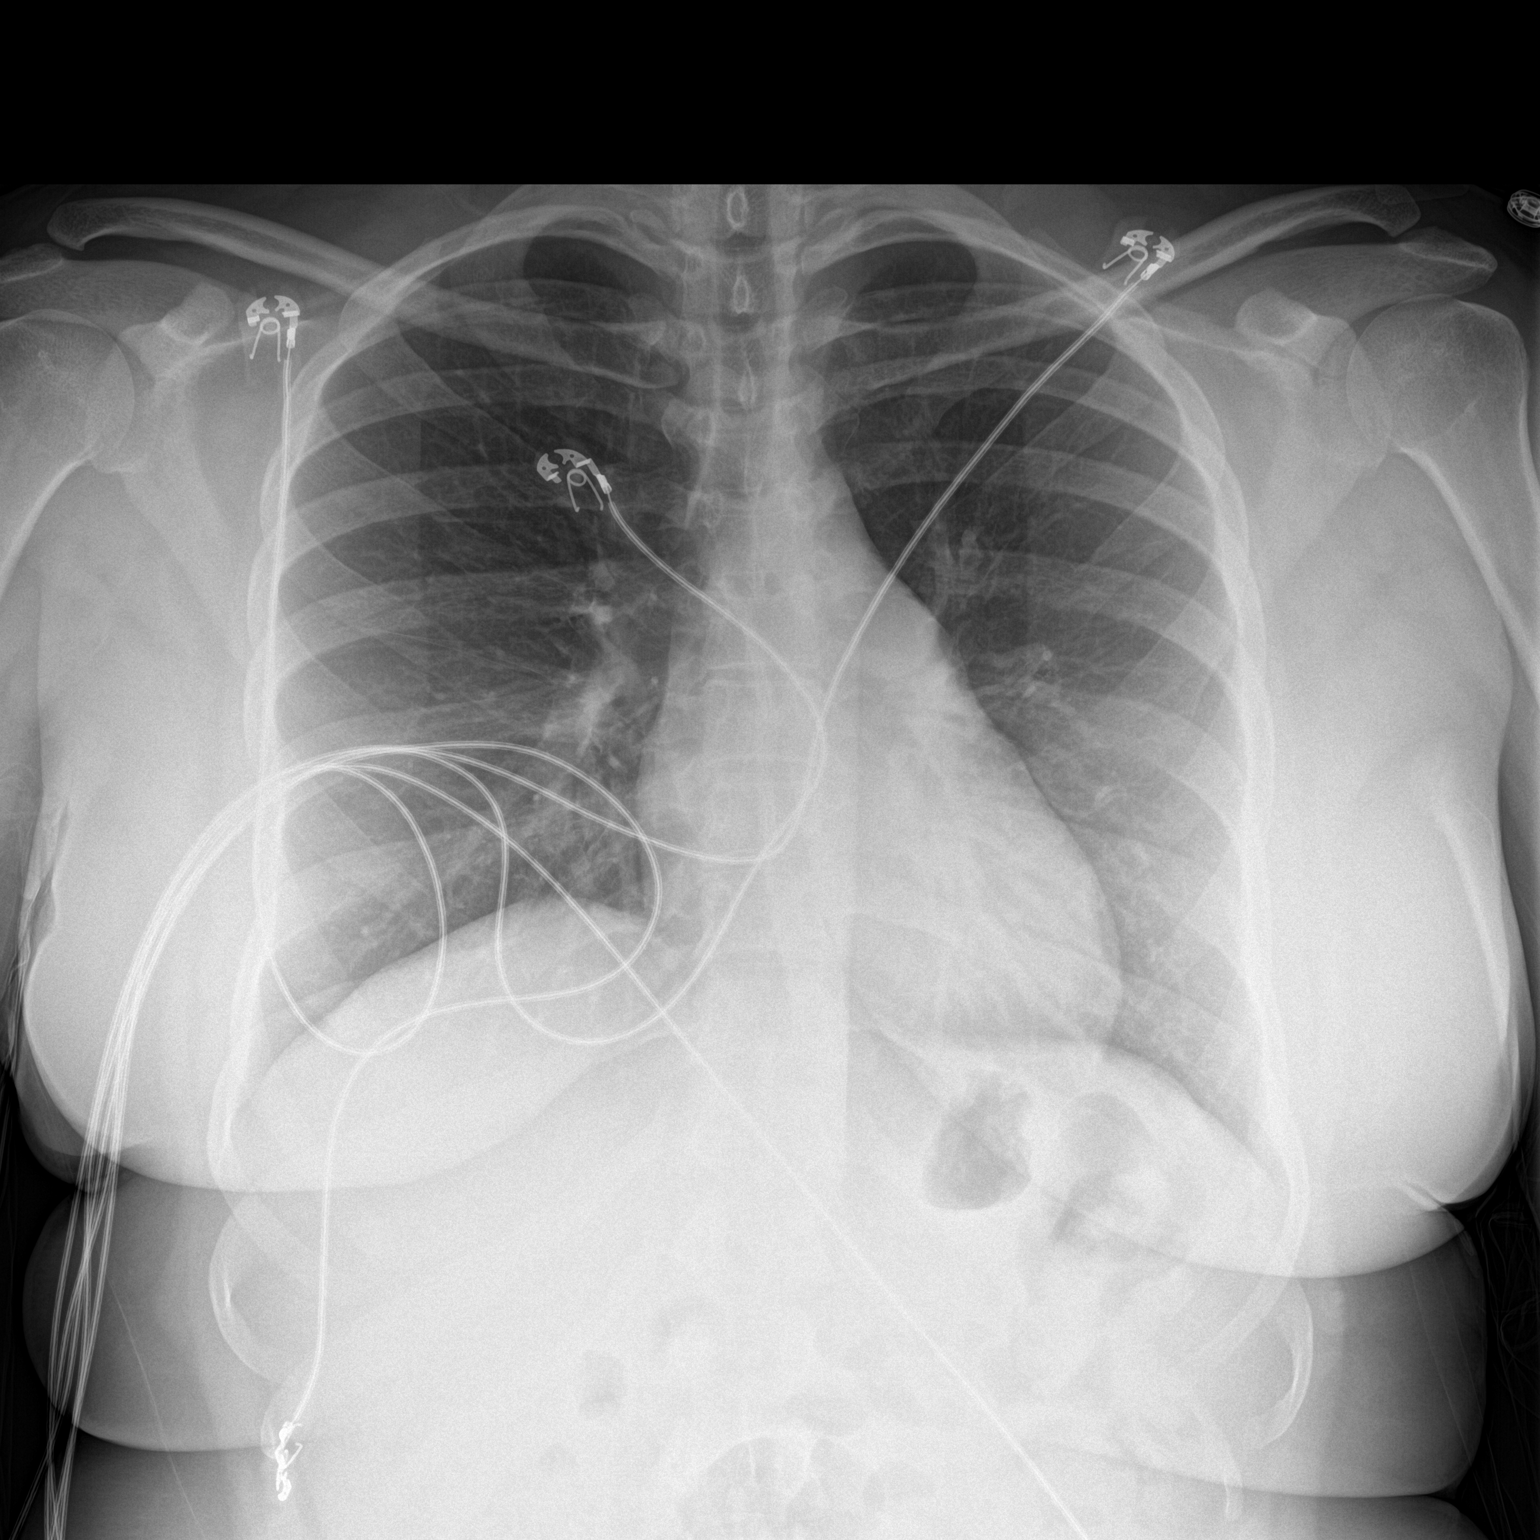

[chest lat]
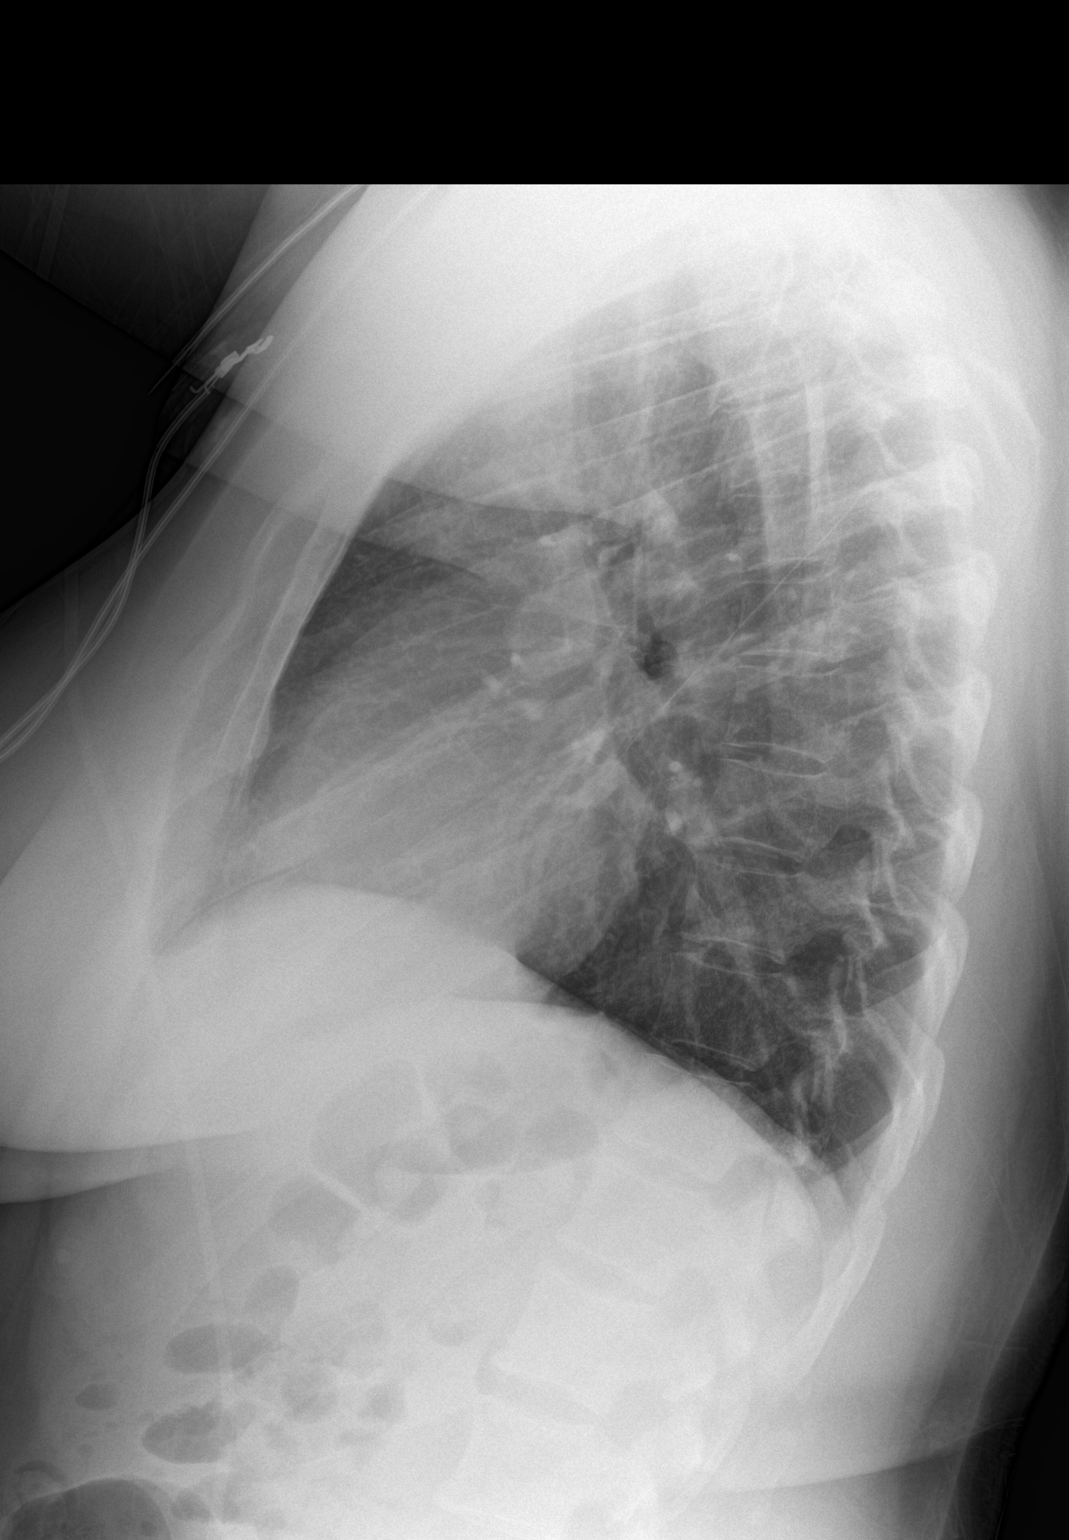

[2 of 2 positions shown; findings below may reference images not displayed]

FINDINGS: Lungs are clear. Heart size and pulmonary vascularity are normal. No
adenopathy. No bone lesions. There are rudimentary cervical ribs
bilaterally.
IMPRESSION: No edema or consolidation.

## 2014-03-06 MED ORDER — PROMETHAZINE HCL 25 MG RE SUPP: 25.0000 mg | Freq: Four times a day (QID) | RECTAL | Status: DC | PRN

## 2014-03-06 MED ORDER — HYDROMORPHONE HCL 1 MG/ML IJ SOLN
1.0000 mg | Freq: Once | INTRAMUSCULAR | Status: AC
  Administered 2014-03-06: 1 mg via INTRAVENOUS
  Filled 2014-03-06: qty 1

## 2014-03-06 MED ORDER — MORPHINE SULFATE 4 MG/ML IJ SOLN
4.0000 mg | Freq: Once | INTRAMUSCULAR | Status: AC
  Administered 2014-03-06: 4 mg via INTRAVENOUS
  Filled 2014-03-06: qty 1

## 2014-03-06 MED ORDER — HYDROMORPHONE HCL 1 MG/ML IJ SOLN
2.0000 mg | Freq: Once | INTRAMUSCULAR | Status: AC
  Administered 2014-03-06: 2 mg via INTRAVENOUS
  Filled 2014-03-06: qty 2

## 2014-03-06 MED ORDER — HYDROCODONE-ACETAMINOPHEN 5-325 MG PO TABS: 2.0000 | ORAL_TABLET | ORAL | Status: DC | PRN

## 2014-03-06 MED ORDER — PROMETHAZINE HCL 25 MG/ML IJ SOLN
25.0000 mg | Freq: Once | INTRAMUSCULAR | Status: AC
  Administered 2014-03-06: 25 mg via INTRAVENOUS
  Filled 2014-03-06: qty 1

## 2014-03-06 MED ORDER — GI COCKTAIL ~~LOC~~
30.0000 mL | Freq: Once | ORAL | Status: AC
  Administered 2014-03-06: 30 mL via ORAL
  Filled 2014-03-06: qty 30

## 2014-03-06 MED ORDER — ONDANSETRON HCL 4 MG/2ML IJ SOLN
4.0000 mg | Freq: Once | INTRAMUSCULAR | Status: AC
  Administered 2014-03-06: 4 mg via INTRAVENOUS
  Filled 2014-03-06: qty 2

## 2014-03-06 MED ORDER — SODIUM CHLORIDE 0.9 % IV BOLUS (SEPSIS)
1000.0000 mL | Freq: Once | INTRAVENOUS | Status: AC
  Administered 2014-03-06: 1000 mL via INTRAVENOUS

## 2014-03-06 NOTE — ED Notes (Signed)
Pt in c/o sickle cell pain crisis x2 days, also n/v, c/o pain to chest, back, abdomen, and bilateral hips and legs, pt tearful during triage

## 2014-03-06 NOTE — ED Provider Notes (Signed)
Medical screening examination/treatment/procedure(s) were conducted as a shared visit with non-physician practitioner(s) and myself.  I personally evaluated the patient during the encounter.   EKG Interpretation   Date/Time:  Thursday March 06 2014 14:16:24 EST Ventricular Rate:  70 PR Interval:  146 QRS Duration: 72 QT Interval:  380 QTC Calculation: 410 R Axis:   59 Text Interpretation:  Sinus rhythm with marked sinus arrhythmia  Nonspecific T wave abnormality Abnormal ECG When compared with ECG of  10/02/2013 No significant change was found Confirmed by [facility]  MD,  Nicholos JohnsKATHLEEN [phone]([id number]) on 03/06/2014 4:20:17 PM      Results for orders placed or performed during the hospital encounter of 03/06/14  CBC WITH DIFFERENTIAL  Result Value Ref Range   WBC 7.6 4.0 - 10.5 K/uL   RBC 4.36 3.87 - 5.11 MIL/uL   Hemoglobin 12.6 12.0 - 15.0 g/dL   HCT 60.436.2 54.036.0 - 98.146.0 %   MCV 83.0 78.0 - 100.0 fL   MCH 28.9 26.0 - 34.0 pg   MCHC 34.8 30.0 - 36.0 g/dL   RDW 19.112.7 47.811.5 - 29.515.5 %   Platelets 266 150 - 400 K/uL   Neutrophils Relative % 48 43 - 77 %   Neutro Abs 3.7 1.7 - 7.7 K/uL   Lymphocytes Relative 42 12 - 46 %   Lymphs Abs 3.2 0.7 - 4.0 K/uL   Monocytes Relative 5 3 - 12 %   Monocytes Absolute 0.4 0.1 - 1.0 K/uL   Eosinophils Relative 4 0 - 5 %   Eosinophils Absolute 0.3 0.0 - 0.7 K/uL   Basophils Relative 1 0 - 1 %   Basophils Absolute 0.0 0.0 - 0.1 K/uL  Comprehensive metabolic panel  Result Value Ref Range   Sodium 141 135 - 145 mmol/L   Potassium 3.8 3.5 - 5.1 mmol/L   Chloride 107 96 - 112 mEq/L   CO2 25 19 - 32 mmol/L   Glucose, Bld 100 (H) 70 - 99 mg/dL   BUN 12 6 - 23 mg/dL   Creatinine, Ser 6.210.95 0.50 - 1.10 mg/dL   Calcium 9.3 8.4 - 30.810.5 mg/dL   Total Protein 7.6 6.0 - 8.3 g/dL   Albumin 3.9 3.5 - 5.2 g/dL   AST 15 0 - 37 U/L   ALT 12 0 - 35 U/L   Alkaline Phosphatase 94 39 - 117 U/L   Total Bilirubin 0.4 0.3 - 1.2 mg/dL   GFR calc non Af Amer 81 (L) >90 mL/min   GFR calc Af Amer >90 >90 mL/min   Anion gap 9 5 - 15  Reticulocytes  Result Value Ref Range   Retic Ct Pct 2.0 0.4 - 3.1 %   RBC. 4.36 3.87 - 5.11 MIL/uL   Retic Count, Manual 87.2 19.0 - 186.0 K/uL  Pregnancy, urine  Result Value Ref Range   Preg Test, Ur NEGATIVE NEGATIVE  Urinalysis, Routine w reflex microscopic  Result Value Ref Range   Color, Urine YELLOW YELLOW   APPearance CLEAR CLEAR   Specific Gravity, Urine 1.015 1.005 - 1.030   pH 5.5 5.0 - 8.0   Glucose, UA NEGATIVE NEGATIVE mg/dL   Hgb urine dipstick NEGATIVE NEGATIVE   Bilirubin Urine NEGATIVE NEGATIVE   Ketones, ur NEGATIVE NEGATIVE mg/dL   Protein, ur NEGATIVE NEGATIVE mg/dL   Urobilinogen, UA 0.2 0.0 - 1.0 mg/dL   Nitrite NEGATIVE NEGATIVE   Leukocytes, UA NEGATIVE NEGATIVE  Lipase, blood  Result Value Ref Range   Lipase 21 11 -  59 U/L  Troponin I  Result Value Ref Range   Troponin I <0.03 <0.031 ng/mL   Dg Chest 2 View  03/06/2014   CLINICAL DATA:  Two day history of chest pain. History of sickle cell disease  EXAM: CHEST  2 VIEW  COMPARISON:  October 02, 2013  FINDINGS: Lungs are clear. Heart size and pulmonary vascularity are normal. No adenopathy. No bone lesions. There are rudimentary cervical ribs bilaterally.  IMPRESSION: No edema or consolidation.   Electronically Signed   By: Bretta Bang M.D.   On: 03/06/2014 16:38    The patient claims that she has sickle cell trait but does get crisis pain. That she's followed at the [facility] has been admitted to the [facility] in the past. Not followed by hematology here. Patient's symptoms started 2 days ago includes chest pain pain in the right hip and into both legs. Patient's lab workup here not suggestive of sickle cell anemia all also there is no reticulocyte cell count elevation. Other labs are normal. Patient's vital signs are not suggestive of severe pain. Patient's had a significant amount of pain medication here. Chest x-ray being negative for  pneumonia and no fevers patient is stable for discharge home we'll treat with hydrocodone at home and [facility] to [facility] locally. Patient does have a history of behavioral health problems which include posttraumatic stress disorder, and G6PD, but there've been no new medicines. Possible it could be behavioral explanation to the pain.  Vanetta Mulders, MD 03/06/14 2010

## 2014-03-06 NOTE — ED Provider Notes (Signed)
CSN: [id number]     Arrival date & time 03/06/14  1411 History   First MD Initiated Contact with Patient 03/06/14 1520     Chief Complaint  Patient presents with  . Sickle Cell Pain Crisis     (Consider location/radiation/quality/duration/timing/severity/associated sxs/prior Treatment) HPI Comments: Leah Shea is a 29 y.o. female with a PMHx of G6PD, sickle cell trait, chronic anemia, asthma, PTSD, anxiety, depression, and diverticular disease, who presents to the ED with complaints of sickle cell crisis 2 days which occurred with the weather change (cold exposure), reporting pain is located in bilateral hips and legs, generalized nonradiating abdomen, and last night she developed gradual onset substernal chest pain which she believes is due to vomiting. Associated symptoms include 5 episodes of nonbloody nonbilious emesis, shortness of breath only with vomiting, and dry cough without wheezing. Also states she has a chronic migraine, gradual onset and the same as her prior migraines. Her pain is all similar to prior sickle cell crises with the exception of her chest pain, and her crises typically occur with cold weather changes. Her leg/hip/abd pain is 9/10 cramping, constant, and nonradiating. Her CP is 5/10 pressure-like intermittent occurring with vomiting, located in the substernal area radiating into the back. Patient has attempted oxycodone, Tylenol, Mobic, and baking soda water with no relief of her symptoms. She denies any fevers, chills, wheezing, hemoptysis, sputum production, diarrhea, constipation, dysuria, hematuria, vaginal bleeding or discharge, vision changes, numbness, tingling, weakness, recent travel/immobilization/surgeries, recent sick contacts, IVDU, smoking, or estrogen use. She has an IUD placed. She has no hx of acute chest or DVT/PE.   Patient is a 29 y.o. female presenting with sickle cell pain. The history is provided by the patient. No language interpreter was used.    Sickle Cell Pain Crisis Location:  Chest, back, abdomen, lower extremity and hip Severity:  Moderate (9/10) Onset quality:  Gradual Duration:  2 days Similar to previous crisis episodes: yes   Timing:  Constant Progression:  Unchanged Chronicity:  Recurrent Sickle cell genotype:  [place] Frequency of attacks:  With weather changes History of pulmonary emboli: no   Context: cold exposure   Relieved by:  Nothing Worsened by:  Activity Ineffective treatments:  Prescription drugs Associated symptoms: chest pain, cough (dry nonproductive), headaches (chronic migraine, unchanged), nausea, shortness of breath (when vomiting) and vomiting (NBNB 5x/day)   Associated symptoms: no congestion, no fever, no leg ulcers, no sore throat, no swelling of legs, no vision change and no wheezing   Chest pain:    Quality:  Pressure   Severity:  Moderate (5/10)   Onset quality:  Gradual   Duration:  1 day   Timing:  Constant   Progression:  Unchanged   Chronicity:  New Risk factors: no history of acute chest syndrome, no recent air travel and not smoking     Past Medical History  Diagnosis Date  . Family history of anesthesia complication     son due sleep apnea  . Sleep apnea     awaiting a sleep study to be done at [place]  . Asthma   . Shortness of breath     with exertion or panic attack  . Bronchitis, acute   . PTSD (post-traumatic stress disorder)   . Anxiety   . Depression   . Diverticular disease   . Headache(784.0)     migraines  . Blood dyscrasia     sickle trait  . Anemia    Past Surgical History  Procedure Laterality  Date  . Eye surgery      x 10 as a child  . 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 Cesarean section  2010, 2012,2014    x 3  . Breast surgery Bilateral     reductions  . Tonsillectomy N/A 10/07/2013    Procedure: TONSILLECTOMY;  Surgeon: Christia Reading, MD;  [facility];  Service: ENT;  Laterality: N/A;   History reviewed. No pertinent family  history. History  Substance Use Topics  . Smoking status: Former Smoker -- 0.25 packs/day for 1 years    Types: Cigarettes    Quit date: 07/02/2013  . Smokeless tobacco: Never Used  . Alcohol Use: Yes     Comment: social   OB History    No data available     Review of Systems  Constitutional: Negative for fever, chills and diaphoresis.  HENT: Negative for congestion and sore throat.   Eyes: Negative for photophobia and visual disturbance.  Respiratory: Positive for cough (dry nonproductive) and shortness of breath (when vomiting). Negative for wheezing.   Cardiovascular: Positive for chest pain. Negative for palpitations and leg swelling.  Gastrointestinal: Positive for nausea, vomiting (NBNB 5x/day) and abdominal pain. Negative for diarrhea, constipation, blood in stool, anal bleeding and rectal pain.  Genitourinary: Negative for dysuria, hematuria, flank pain, vaginal bleeding and vaginal discharge.  Musculoskeletal: Positive for myalgias, back pain and arthralgias (b/l hips and legs). Negative for joint swelling and neck pain.  Skin: Negative for color change and rash.  Neurological: Positive for headaches (chronic migraine, unchanged). Negative for dizziness, syncope, weakness, light-headedness and numbness.  Psychiatric/Behavioral: Negative for confusion.   10 Systems reviewed and are negative for acute change except as noted in the HPI.    Allergies  Other and Nsaids  Home Medications   Prior to Admission medications   Medication Sig Start Date End Date Taking? Authorizing Provider  albuterol (PROVENTIL HFA;VENTOLIN HFA) 108 (90 BASE) MCG/ACT inhaler Inhale 2 puffs into the lungs every 6 (six) hours as needed for wheezing or shortness of breath.    Historical Provider, MD  ALPRAZolam Prudy Feeler) 0.5 MG tablet Take 1 tablet (0.5 mg total) by mouth 3 (three) times daily as needed for anxiety. 10/09/13   Christia Reading, MD  oxyCODONE (ROXICODONE) 5 MG/5ML solution Take 10 mLs (10  mg total) by mouth every 4 (four) hours as needed for severe pain. 10/08/13   Christia Reading, MD  promethazine (PHENERGAN) 12.5 MG suppository Place 1 suppository (12.5 mg total) rectally every 6 (six) hours as needed for nausea or vomiting. 10/09/13   Christia Reading, MD   BP 127/80 mmHg  Pulse 68  Temp(Src) 98.4 F (36.9 C) (Oral)  Resp 16  Ht  (1.676 m)  Wt 200 lb (90.719 kg)  BMI 32.30 kg/m2  SpO2 97% Physical Exam  Constitutional: She is oriented to person, place, and time. Vital signs are normal. She appears well-developed and well-nourished.  Non-toxic appearance. No distress.  Afebrile, nontoxic, NAD, speaking in full sentences, sitting in bed watching TV, VSS  HENT:  Head: Normocephalic and atraumatic.  Mouth/Throat: Oropharynx is clear and moist. Mucous membranes are dry.  Dry mucous membranes  Eyes: Conjunctivae and EOM are normal. Pupils are equal, round, and reactive to light. Right eye exhibits no discharge. Left eye exhibits no discharge.  PERRL, EOMI  Neck: Normal range of motion. Neck supple.  No meningismus  Cardiovascular: Normal rate, regular rhythm, normal heart sounds and intact  distal pulses.  Exam reveals no gallop and no friction rub.   No murmur heard. RRR, nl s1/s2, no m/r/g, distal pulses intact, no pedal edema   Pulmonary/Chest: Effort normal and breath sounds normal. No respiratory distress. She has no decreased breath sounds. She has no wheezes. She has no rhonchi. She has no rales. She exhibits tenderness. She exhibits no crepitus, no deformity and no retraction.    CTAB in all lung fields, no w/r/r Chest wall TTP diffusely anteriorly, no crepitus or deformity No hypoxia or increased WOB, speaking in full sentences with SpO2 97% on RA  Abdominal: Soft. Normal appearance and bowel sounds are normal. She exhibits no distension. There is generalized tenderness. There is no rigidity, no rebound, no guarding, no CVA tenderness, no tenderness at McBurney's  point and negative Murphy's sign.  Soft, ND, +BS throughout, with diffuse mild TTP generalized throughout, no r/g/r, neg murphy's, neg mcburney's, no CVA TTP   Musculoskeletal: Normal range of motion.  MAE x4 No pedal edema Distal pulses intact Strength 5/5 in all extremities, sensation grossly intact in all extremities Diffuse hip/leg TTP, neg homan's sign, gait steady and FROM intact in all joints, no joint line TTP or bony deformity/crepitus in BLEs, no erythema or warmth.  Neurological: She is alert and oriented to person, place, and time. She has normal strength. No cranial nerve deficit or sensory deficit. Gait normal.  CN 2-12 grossly intact A&O x4 GCS 15 Sensation and strength intact Gait nonataxic  No focal neuro deficits  Skin: Skin is warm, dry and intact. No rash noted.  Psychiatric: She has a normal mood and affect.  Nursing note and vitals reviewed.   ED Course  Procedures (including critical care time) Labs Review Labs Reviewed  COMPREHENSIVE METABOLIC PANEL - Abnormal; Notable for the following:    Glucose, Bld 100 (*)    GFR calc non Af Amer 81 (*)    All other components within normal limits  CBC WITH DIFFERENTIAL  RETICULOCYTES  PREGNANCY, URINE  URINALYSIS, ROUTINE W REFLEX MICROSCOPIC  LIPASE, BLOOD  I-STAT TROPOININ, ED    Imaging Review Dg Chest 2 View  03/06/2014   CLINICAL DATA:  Two day history of chest pain. History of sickle cell disease  EXAM: CHEST  2 VIEW  COMPARISON:  October 02, 2013  FINDINGS: Lungs are clear. Heart size and pulmonary vascularity are normal. No adenopathy. No bone lesions. There are rudimentary cervical ribs bilaterally.  IMPRESSION: No edema or consolidation.   Electronically Signed   By: Bretta Bang M.D.   On: 03/06/2014 16:38     EKG Interpretation   Date/Time:  Thursday March 06 2014 14:16:24 EST Ventricular Rate:  70 PR Interval:  146 QRS Duration: 72 QT Interval:  380 QTC Calculation: 410 R Axis:    59 Text Interpretation:  Sinus rhythm with marked sinus arrhythmia  Nonspecific T wave abnormality Abnormal ECG When compared with ECG of  10/02/2013 No significant change was found Confirmed by [facility]  MD,  Nicholos Johns [phone]) on 03/06/2014 4:20:17 PM        MDM   Final diagnoses:  Chest pain  Sickle cell crisis  Non-intractable vomiting with nausea, vomiting of unspecified type  Generalized abdominal pain  Myalgia    29 y.o. female with sickle cell trait here for crisis with b/l hip/leg pain, abd pain, n/v, and chest pain/SOB with vomiting. Also has chronic migraine, nonfocal neuro exam, doubt need for further imaging. Appears dehydrated with dry mucous membranes. No  hypoxia, speaking in full sentences, PERC neg, doubt DVT/PE. CP reproducible, likely musculoskeletal vs GI etiology. EKG showing sinus arrhythmia but unchanged from prior. CBC unremarkable, CMP unremarkable, Retics WNL. Given CP, will give GI cocktail and morphine but pt states she can't take NSAIDs therefore will hold ASA/toradol, and since BP 127/80 will hold on NTG to avoid any hypotension or worsening of HA. CP seems likely to be related to vomiting, but will get CXR to r/o Boerhaave's/mediastinitis/acute chest. Will obtain trop, U/A, Upreg, and lipase. Will give fluids and phenergan. Abd exam with no focal tenderness, nonperitoneal, doubt need for imaging. Will reassess shortly.   4:57 PM Upreg neg, still awaiting U/A and lipase and trop. CXR WNL, doubt mediastinitis or acute chest syndrome. At this time, care signed over [facility] PA-C at shift change, who will follow up with results. If pt's symptoms can be controlled, and she is able to tolerate PO with no ongoing CP, she is safe for discharge, but if pt symptoms unable to be controlled pt could be admitted for sickle cell pain/CP rule out. Please see his dictation for further documentation of care. Will write for phenergan suppositories for home, and instructions to  use home pain meds and see her PCP in the event that she is discharged, but again please see Carmon GinsbergBen Cartner's dictation for further documentation of her dispo/care.  BP 127/80 mmHg  Pulse 68  Temp(Src) 98.4 F (36.9 C) (Oral)  Resp 16  Ht 5\' 6"  (1.676 m)  Wt 200 lb (90.719 kg)  BMI 32.30 kg/m2  SpO2 97%  Meds ordered this encounter  Medications  . sodium chloride 0.9 % bolus 1,000 mL    Sig:   . gi cocktail (Maalox,Lidocaine,Donnatal)    Sig:   . morphine 4 MG/ML injection 4 mg    Sig:   . promethazine (PHENERGAN) injection 25 mg    Sig:   . promethazine (PHENERGAN) 25 MG suppository    Sig: Place 1 suppository (25 mg total) rectally every 6 (six) hours as needed for nausea or vomiting.    Dispense:  12 suppository    Refill:  1    Order Specific Question:  Supervising Provider    Answer:  Eber HongMILLER, BRIAN D [[id number]]     Donnita FallsMercedes Strupp Camprubi-Soms, PA-C 03/06/14 1703  Samuel JesterKathleen McManus, DO 03/09/14 1420

## 2014-03-06 NOTE — Discharge Instructions (Signed)
Use phenergan suppositories as needed for nausea/vomiting. Use maalox as needed for chest pain associated with vomiting. Stay well hydrated with small sips of fluids. Take your home pain medication as needed for pain. See your regular doctor or hematologist in 1 week for ongoing management and recheck of today's symptoms. Return to the ER for changes or worsening symptoms.   Abdominal Pain, Women Abdominal (stomach, pelvic, or belly) pain can be caused by many things. It is important to tell your doctor:  The location of the pain.  Does it come and go or is it present all the time?  Are there things that start the pain (eating certain foods, exercise)?  Are there other symptoms associated with the pain (fever, nausea, vomiting, diarrhea)? All of this is helpful to know when trying to find the cause of the pain. CAUSES   Stomach: virus or bacteria infection, or ulcer.  Intestine: appendicitis (inflamed appendix), regional ileitis (Crohn's disease), ulcerative colitis (inflamed colon), irritable bowel syndrome, diverticulitis (inflamed diverticulum of the colon), or cancer of the stomach or intestine.  Gallbladder disease or stones in the gallbladder.  Kidney disease, kidney stones, or infection.  Pancreas infection or cancer.  Fibromyalgia (pain disorder).  Diseases of the female organs:  Uterus: fibroid (non-cancerous) tumors or infection.  Fallopian tubes: infection or tubal pregnancy.  Ovary: cysts or tumors.  Pelvic adhesions (scar tissue).  Endometriosis (uterus lining tissue growing in the pelvis and on the pelvic organs).  Pelvic congestion syndrome (female organs filling up with blood just before the menstrual period).  Pain with the menstrual period.  Pain with ovulation (producing an egg).  Pain with an IUD (intrauterine device, birth control) in the uterus.  Cancer of the female organs.  Functional pain (pain not caused by a disease, may improve without  treatment).  Psychological pain.  Depression. DIAGNOSIS  Your doctor will decide the seriousness of your pain by doing an examination.  Blood tests.  X-rays.  Ultrasound.  CT scan (computed tomography, special type of X-ray).  MRI (magnetic resonance imaging).  Cultures, for infection.  Barium enema (dye inserted in the large intestine, to better view it with X-rays).  Colonoscopy (looking in intestine with a lighted tube).  Laparoscopy (minor surgery, looking in abdomen with a lighted tube).  Major abdominal exploratory surgery (looking in abdomen with a large incision). TREATMENT  The treatment will depend on the cause of the pain.   Many cases can be observed and treated at home.  Over-the-counter medicines recommended by your caregiver.  Prescription medicine.  Antibiotics, for infection.  Birth control pills, for painful periods or for ovulation pain.  Hormone treatment, for endometriosis.  Nerve blocking injections.  Physical therapy.  Antidepressants.  Counseling with a psychologist or psychiatrist.  Minor or major surgery. HOME CARE INSTRUCTIONS   Do not take laxatives, unless directed by your caregiver.  Take over-the-counter pain medicine only if ordered by your caregiver. Do not take aspirin because it can cause an upset stomach or bleeding.  Try a clear liquid diet (broth or water) as ordered by your caregiver. Slowly move to a bland diet, as tolerated, if the pain is related to the stomach or intestine.  Have a thermometer and take your temperature several times a day, and record it.  Bed rest and sleep, if it helps the pain.  Avoid sexual intercourse, if it causes pain.  Avoid stressful situations.  Keep your follow-up appointments and tests, as your caregiver orders.  If the pain does  not go away with medicine or surgery, you may try:  Acupuncture.  Relaxation exercises (yoga, meditation).  Group therapy.  Counseling. SEEK  MEDICAL CARE IF:   You notice certain foods cause stomach pain.  Your home care treatment is not helping your pain.  You need stronger pain medicine.  You want your IUD removed.  You feel faint or lightheaded.  You develop nausea and vomiting.  You develop a rash.  You are having side effects or an allergy to your medicine. SEEK IMMEDIATE MEDICAL CARE IF:   Your pain does not go away or gets worse.  You have a fever.  Your pain is felt only in portions of the abdomen. The right side could possibly be appendicitis. The left lower portion of the abdomen could be colitis or diverticulitis.  You are passing blood in your stools (bright red or black tarry stools, with or without vomiting).  You have blood in your urine.  You develop chills, with or without a fever.  You pass out. MAKE SURE YOU:   Understand these instructions.  Will watch your condition.  Will get help right away if you are not doing well or get worse. Document Released: 11/28/2006 Document Revised: 06/17/2013 Document Reviewed: 12/18/2008 [facility] Patient Information 2015 [place], [place]. This information is not intended to replace advice given to you by your health care provider. Make sure you discuss any questions you have with your health care provider.  Nausea and Vomiting Nausea is a sick feeling that often comes before throwing up (vomiting). Vomiting is a reflex where stomach contents come out of your mouth. Vomiting can cause severe loss of body fluids (dehydration). Children and elderly adults can become dehydrated quickly, especially if they also have diarrhea. Nausea and vomiting are symptoms of a condition or disease. It is important to find the cause of your symptoms. CAUSES   Direct irritation of the stomach lining. This irritation can result from increased acid production (gastroesophageal reflux disease), infection, food poisoning, taking certain medicines (such as nonsteroidal  anti-inflammatory drugs), alcohol use, or tobacco use.  Signals from the brain.These signals could be caused by a headache, heat exposure, an inner ear disturbance, increased pressure in the brain from injury, infection, a tumor, or a concussion, pain, emotional stimulus, or metabolic problems.  An obstruction in the gastrointestinal tract (bowel obstruction).  Illnesses such as diabetes, hepatitis, gallbladder problems, appendicitis, kidney problems, cancer, sepsis, atypical symptoms of a heart attack, or eating disorders.  Medical treatments such as chemotherapy and radiation.  Receiving medicine that makes you sleep (general anesthetic) during surgery. DIAGNOSIS Your caregiver may ask for tests to be done if the problems do not improve after a few days. Tests may also be done if symptoms are severe or if the reason for the nausea and vomiting is not clear. Tests may include:  Urine tests.  Blood tests.  Stool tests.  Cultures (to look for evidence of infection).  X-rays or other imaging studies. Test results can help your caregiver make decisions about treatment or the need for additional tests. TREATMENT You need to stay well hydrated. Drink frequently but in small amounts.You may wish to drink water, sports drinks, clear broth, or eat frozen ice pops or gelatin dessert to help stay hydrated.When you eat, eating slowly may help prevent nausea.There are also some antinausea medicines that may help prevent nausea. HOME CARE INSTRUCTIONS   Take all medicine as directed by your caregiver.  If you do not have an appetite, do  not force yourself to eat. However, you must continue to drink fluids.  If you have an appetite, eat a normal diet unless your caregiver tells you differently.  Eat a variety of complex carbohydrates (rice, wheat, potatoes, bread), lean meats, yogurt, fruits, and vegetables.  Avoid high-fat foods because they are more difficult to digest.  Drink enough  water and fluids to keep your urine clear or pale yellow.  If you are dehydrated, ask your caregiver for specific rehydration instructions. Signs of dehydration may include:  Severe thirst.  Dry lips and mouth.  Dizziness.  Dark urine.  Decreasing urine frequency and amount.  Confusion.  Rapid breathing or pulse. SEEK IMMEDIATE MEDICAL CARE IF:   You have blood or brown flecks (like coffee grounds) in your vomit.  You have black or bloody stools.  You have a severe headache or stiff neck.  You are confused.  You have severe abdominal pain.  You have chest pain or trouble breathing.  You do not urinate at least once every 8 hours.  You develop cold or clammy skin.  You continue to vomit for longer than 24 to 48 hours.  You have a fever. MAKE SURE YOU:   Understand these instructions.  Will watch your condition.  Will get help right away if you are not doing well or get worse. Document Released: 01/31/2005 Document Revised: 04/25/2011 Document Reviewed: 06/30/2010 [facility] Patient Information 2015 [place], [place]. This information is not intended to replace advice given to you by your health care provider. Make sure you discuss any questions you have with your health care provider.

## 2014-03-06 NOTE — ED Provider Notes (Signed)
Pt signed out to me by France RavensMercedes Camprubi-Soms, PA-C, Pt with sickle cell trait presents with sickle cell pain crisis. Reports chest pain now that is typical of her pain crisis. Denies fever, cough, SOB. Plan is to control pain and if no new objective findings, DC home to f/u with PCP.  States that [place] [facility] [place] and is typically [facility]. There are no objective findings to suggest a sickling crisis. There is no elevated reticulocyte count there is no anemia. Chest x-ray is negative. Vital signs are stable and within normal limits. Discussed patient will need to follow-up with her primary care Discharged with short course pain medicines.  Prior to patient discharge, I discussed and reviewed this case with Dr.Zackowski, who also saw and evaluated the patient.    Meds given in ED:  Medications  sodium chloride 0.9 % bolus 1,000 mL (0 mLs Intravenous Stopped 03/06/14 1821)  gi cocktail (Maalox,Lidocaine,Donnatal) (30 mLs Oral Given 03/06/14 1618)  morphine 4 MG/ML injection 4 mg (4 mg Intravenous Given 03/06/14 1619)  promethazine (PHENERGAN) injection 25 mg (25 mg Intravenous Given 03/06/14 1618)  morphine 4 MG/ML injection 4 mg (4 mg Intravenous Given 03/06/14 1746)  HYDROmorphone (DILAUDID) injection 1 mg (1 mg Intravenous Given 03/06/14 1814)  ondansetron (ZOFRAN) injection 4 mg (4 mg Intravenous Given 03/06/14 1814)  HYDROmorphone (DILAUDID) injection 2 mg (2 mg Intravenous Given 03/06/14 1857)    New Prescriptions   No medications on file   Filed Vitals:   03/06/14 1418 03/06/14 1513  BP: 115/73 127/80  Pulse: 74 68  Temp: 98 F (36.7 C) 98.4 F (36.9 C)  TempSrc: Oral Oral  Resp: 20 16  Height: 5\' 6"  (1.676 m)   Weight: 200 lb (90.719 kg)   SpO2: 100% 97%      Sharlene MottsBenjamin W Reagyn Facemire, PA-C 03/07/14 0022

## 2014-08-01 ENCOUNTER — Encounter (HOSPITAL_COMMUNITY): Payer: Self-pay | Admitting: Vascular Surgery

## 2014-08-01 ENCOUNTER — Emergency Department (HOSPITAL_COMMUNITY)
Admission: EM | Admit: 2014-08-01 | Discharge: 2014-08-01 | Disposition: A | Discharge status: Home / Self Care | Payer: Medicaid Other | Attending: Emergency Medicine | Admitting: Emergency Medicine

## 2014-08-01 DIAGNOSIS — R590 Localized enlarged lymph nodes: Secondary | ICD-10-CM | POA: Diagnosis not present

## 2014-08-01 DIAGNOSIS — R1032 Left lower quadrant pain: Secondary | ICD-10-CM | POA: Insufficient documentation

## 2014-08-01 DIAGNOSIS — Z8659 Personal history of other mental and behavioral disorders: Secondary | ICD-10-CM | POA: Diagnosis not present

## 2014-08-01 DIAGNOSIS — Z3202 Encounter for pregnancy test, result negative: Secondary | ICD-10-CM | POA: Insufficient documentation

## 2014-08-01 DIAGNOSIS — Z862 Personal history of diseases of the blood and blood-forming organs and certain disorders involving the immune mechanism: Secondary | ICD-10-CM | POA: Diagnosis not present

## 2014-08-01 DIAGNOSIS — J45909 Unspecified asthma, uncomplicated: Secondary | ICD-10-CM | POA: Diagnosis not present

## 2014-08-01 DIAGNOSIS — Z8669 Personal history of other diseases of the nervous system and sense organs: Secondary | ICD-10-CM | POA: Diagnosis not present

## 2014-08-01 DIAGNOSIS — J029 Acute pharyngitis, unspecified: Secondary | ICD-10-CM | POA: Diagnosis not present

## 2014-08-01 DIAGNOSIS — M25571 Pain in right ankle and joints of right foot: Secondary | ICD-10-CM | POA: Insufficient documentation

## 2014-08-01 DIAGNOSIS — Z7951 Long term (current) use of inhaled steroids: Secondary | ICD-10-CM | POA: Diagnosis not present

## 2014-08-01 DIAGNOSIS — Z8719 Personal history of other diseases of the digestive system: Secondary | ICD-10-CM | POA: Diagnosis not present

## 2014-08-01 DIAGNOSIS — Z87891 Personal history of nicotine dependence: Secondary | ICD-10-CM | POA: Insufficient documentation

## 2014-08-01 LAB — URINALYSIS, ROUTINE W REFLEX MICROSCOPIC
Bilirubin Urine: NEGATIVE
Glucose, UA: NEGATIVE mg/dL
Hgb urine dipstick: NEGATIVE
KETONES UR: NEGATIVE mg/dL
LEUKOCYTES UA: NEGATIVE
Nitrite: NEGATIVE
PH: 6 (ref 5.0–8.0)
PROTEIN: NEGATIVE mg/dL
Specific Gravity, Urine: 1.015 (ref 1.005–1.030)
Urobilinogen, UA: 1 mg/dL (ref 0.0–1.0)

## 2014-08-01 LAB — POC URINE PREG, ED: PREG TEST UR: NEGATIVE

## 2014-08-01 MED ORDER — OXYCODONE-ACETAMINOPHEN 5-325 MG PO TABS
2.0000 | ORAL_TABLET | Freq: Once | ORAL | Status: AC
  Administered 2014-08-01: 2 via ORAL
  Filled 2014-08-01 (×2): qty 2

## 2014-08-01 MED ORDER — AMOXICILLIN-POT CLAVULANATE 875-125 MG PO TABS: 1.0000 | ORAL_TABLET | Freq: Two times a day (BID) | ORAL | Status: DC

## 2014-08-01 MED ORDER — ONDANSETRON HCL 4 MG/2ML IJ SOLN: 4.0000 mg | Freq: Once | INTRAMUSCULAR | Status: DC

## 2014-08-01 MED ORDER — HYDROCODONE-ACETAMINOPHEN 5-325 MG PO TABS: 2.0000 | ORAL_TABLET | ORAL | Status: DC | PRN

## 2014-08-01 MED ORDER — AMOXICILLIN-POT CLAVULANATE 875-125 MG PO TABS
1.0000 | ORAL_TABLET | Freq: Once | ORAL | Status: AC
Start: 2014-08-01 — End: 2014-08-01
  Administered 2014-08-01: 1 via ORAL
  Filled 2014-08-01: qty 1

## 2014-08-01 MED ORDER — ONDANSETRON 4 MG PO TBDP: 4.0000 mg | ORAL_TABLET | Freq: Three times a day (TID) | ORAL | Status: DC | PRN

## 2014-08-01 MED ORDER — ONDANSETRON 4 MG PO TBDP
4.0000 mg | ORAL_TABLET | Freq: Once | ORAL | Status: AC
  Administered 2014-08-01: 4 mg via ORAL
  Filled 2014-08-01: qty 1

## 2014-08-01 NOTE — ED Provider Notes (Signed)
CSN: [id number]     Arrival date & time 08/01/14  1093 History   First MD Initiated Contact with Patient 08/01/14 [phone]     Chief Complaint  Patient presents with  . Abdominal Pain  . Sore Throat  . Foot Pain     (Consider location/radiation/quality/duration/timing/severity/associated sxs/prior Treatment) HPI Comments: 29 y/o female with hx of asthma, PTSD, diverticulitis, recurrent strep infections of the throat requiring tonsillectomy last year. She presents with 24 hours of worsening sore throat, hoarseness of the voice associated with additional left lower quadrant tenderness and some pain in her right ankle. She did have a visit approximately 6 months ago with multiple pain complaints, no etiology was found, she denies having sickle cell disease though she does state that she has sickle cell trait. Her sore throat is persistent, not associated with difficulty swallowing or speaking other than the change in the quality of her voice. She has no fevers, no vomiting though she does have nausea, she does have some diarrhea and left lower quadrant pain. She denies dysuria, denies vaginal discharge bleeding or pregnancy. No medications given prior to arrival.  Patient is a 29 y.o. female presenting with abdominal pain, pharyngitis, and lower extremity pain. The history is provided by the patient.  Abdominal Pain Sore Throat Associated symptoms include abdominal pain.  Foot Pain Associated symptoms include abdominal pain.    Past Medical History  Diagnosis Date  . Family history of anesthesia complication     son due sleep apnea  . Sleep apnea     awaiting a sleep study to be done at [place]  . Asthma   . Shortness of breath     with exertion or panic attack  . Bronchitis, acute   . PTSD (post-traumatic stress disorder)   . Anxiety   . Depression   . Diverticular disease   . Headache(784.0)     migraines  . Blood dyscrasia     sickle trait  . Anemia    Past Surgical History   Procedure Laterality Date  . Eye surgery      x 10 as a child  . 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 Cesarean section  2010, 2012,2014    x 3  . Breast surgery Bilateral     reductions  . Tonsillectomy N/A 10/07/2013    Procedure: TONSILLECTOMY;  Surgeon: Christia Reading, MD;  Location: [facility] OR;  Service: ENT;  Laterality: N/A;   No family history on file. History  Substance Use Topics  . Smoking status: Former Smoker -- 0.25 packs/day for 1 years    Types: Cigarettes    Quit date: 07/02/2013  . Smokeless tobacco: Never Used  . Alcohol Use: Yes     Comment: social   OB History    No data available     Review of Systems  Gastrointestinal: Positive for abdominal pain.  All other systems reviewed and are negative.     Allergies  Other; Nsaids; and Sulfa antibiotics  Home Medications   Prior to Admission medications   Medication Sig Start Date End Date Taking? Authorizing Provider  albuterol (PROVENTIL HFA;VENTOLIN HFA) 108 (90 BASE) MCG/ACT inhaler Inhale 2 puffs into the lungs every 6 (six) hours as needed for wheezing or shortness of breath.   Yes Historical Provider, MD  amoxicillin-clavulanate (AUGMENTIN) 875-125 MG per tablet Take 1 tablet by mouth every 12 (twelve) hours. 08/01/14   Eber Hong, MD  HYDROcodone-acetaminophen (NORCO/VICODIN) 5-325 MG per  tablet Take 2 tablets by mouth every 4 (four) hours as needed. 08/01/14   Eber Hong, MD  ondansetron (ZOFRAN ODT) 4 MG disintegrating tablet Take 1 tablet (4 mg total) by mouth every 8 (eight) hours as needed for nausea. 08/01/14   Eber Hong, MD   BP 113/71 mmHg  Pulse 88  Temp(Src) 99.1 F (37.3 C) (Oral)  Resp 16  SpO2 98% Physical Exam  Constitutional: She appears well-developed and well-nourished. No distress.  HENT:  Head: Normocephalic and atraumatic.  Mouth/Throat: Oropharynx is clear and moist. No oropharyngeal exudate.  Oropharynx is clear, moist mucous membranes, erythema without exudate  asymmetry or hypertrophy, uvula midline  Eyes: Conjunctivae and EOM are normal. Pupils are equal, round, and reactive to light. Right eye exhibits no discharge. Left eye exhibits no discharge. No scleral icterus.  Neck: Normal range of motion. Neck supple. No JVD present. No thyromegaly present.  Shotty anterior cervical lymphadenopathy on the left  Cardiovascular: Regular rhythm, normal heart sounds and intact distal pulses.  Exam reveals no gallop and no friction rub.   No murmur heard. Pulse of 105, strong pulses at the radial arteries  Pulmonary/Chest: Effort normal and breath sounds normal. No respiratory distress. She has no wheezes. She has no rales.  Abdominal: Soft. Bowel sounds are normal. She exhibits no distension and no mass. There is tenderness ( No right lower quadrant tenderness, no pain in McBurney's point, no Murphy sign, tenderness in the left lower quadrant without guarding).  Musculoskeletal: Normal range of motion. She exhibits tenderness. She exhibits no edema.  Mild tenderness with range of motion of the right ankle, no swelling redness or warmth, normal strength at all major muscle groups of the right and left lower extremities  Lymphadenopathy:    She has no cervical adenopathy.  Neurological: She is alert. Coordination normal.  Skin: Skin is warm and dry. No rash noted. No erythema.  Psychiatric: She has a normal mood and affect. Her behavior is normal.  Nursing note and vitals reviewed.   ED Course  Procedures (including critical care time) Labs Review Labs Reviewed  URINALYSIS, ROUTINE W REFLEX MICROSCOPIC (NOT AT [facility])  POC URINE PREG, ED    Imaging Review No results found.   MDM   Final diagnoses:  Pharyngitis  Left lower quadrant pain    Sore throat, possibly strep, in combination with left lower quadrant and history of diverticulitis will treat with Augmentin, avoid testing, doubt abscess or perforation given overall benign abdominal exam, check  urine for pregnancy, urinary tract infection, anticipate discharge, the patient is in agreement with the plan, especially to avoid imaging and radiation.  Labs normal - not preg, UA without infction - VS normal,   Meds given in ED:  Medications  amoxicillin-clavulanate (AUGMENTIN) 875-125 MG per tablet 1 tablet (1 tablet Oral Given 08/01/14 0921)  ondansetron (ZOFRAN-ODT) disintegrating tablet 4 mg (4 mg Oral Given 08/01/14 0920)  oxyCODONE-acetaminophen (PERCOCET/ROXICET) 5-325 MG per tablet 2 tablet (2 tablets Oral Given 08/01/14 0921)    New Prescriptions   AMOXICILLIN-CLAVULANATE (AUGMENTIN) 875-125 MG PER TABLET    Take 1 tablet by mouth every 12 (twelve) hours.   HYDROCODONE-ACETAMINOPHEN (NORCO/VICODIN) 5-325 MG PER TABLET    Take 2 tablets by mouth every 4 (four) hours as needed.   ONDANSETRON (ZOFRAN ODT) 4 MG DISINTEGRATING TABLET    Take 1 tablet (4 mg total) by mouth every 8 (eight) hours as needed for nausea.      Eber Hong, MD 08/01/14 1101

## 2014-08-01 NOTE — Discharge Instructions (Signed)

## 2014-08-01 NOTE — ED Notes (Signed)
Pt reports to the ED for eval of sore throat, left flank and LLQ abd pain, and right foot pain. She reports she has had the abd pain x 2 weeks but it has gotten worse in the past 3 days. Pt also reports N/V/D. The sore throat started after several episodes of emesis. Throat erythematous. Airway intact. She reports pain in her foot started swelling and is hurting when she is walking. Pt reports hx of C- section. Pt A&Ox4, resp e/u, and skin warm and dry.

## 2014-10-16 ENCOUNTER — Encounter (HOSPITAL_COMMUNITY): Payer: Self-pay

## 2014-10-16 ENCOUNTER — Encounter (HOSPITAL_COMMUNITY): Payer: Self-pay | Admitting: Emergency Medicine

## 2014-10-16 ENCOUNTER — Emergency Department (HOSPITAL_COMMUNITY): Payer: Medicaid Other

## 2014-10-16 ENCOUNTER — Emergency Department (HOSPITAL_COMMUNITY)
Admission: EM | Admit: 2014-10-16 | Discharge: 2014-10-16 | Disposition: A | Discharge status: Home / Self Care | Payer: Medicaid Other | Attending: Emergency Medicine | Admitting: Emergency Medicine

## 2014-10-16 ENCOUNTER — Emergency Department (HOSPITAL_COMMUNITY)
Admission: EM | Admit: 2014-10-16 | Discharge: 2014-10-16 | Disposition: A | Discharge status: Nursing Home (Includes ICF) | Payer: Medicaid Other | Source: Home / Self Care | Attending: Family Medicine | Admitting: Family Medicine

## 2014-10-16 DIAGNOSIS — Z79899 Other long term (current) drug therapy: Secondary | ICD-10-CM | POA: Insufficient documentation

## 2014-10-16 DIAGNOSIS — R1031 Right lower quadrant pain: Secondary | ICD-10-CM | POA: Insufficient documentation

## 2014-10-16 DIAGNOSIS — Z8659 Personal history of other mental and behavioral disorders: Secondary | ICD-10-CM | POA: Diagnosis not present

## 2014-10-16 DIAGNOSIS — J45901 Unspecified asthma with (acute) exacerbation: Secondary | ICD-10-CM | POA: Insufficient documentation

## 2014-10-16 DIAGNOSIS — Z87891 Personal history of nicotine dependence: Secondary | ICD-10-CM | POA: Diagnosis not present

## 2014-10-16 DIAGNOSIS — R112 Nausea with vomiting, unspecified: Secondary | ICD-10-CM

## 2014-10-16 DIAGNOSIS — R1110 Vomiting, unspecified: Secondary | ICD-10-CM

## 2014-10-16 DIAGNOSIS — R109 Unspecified abdominal pain: Secondary | ICD-10-CM

## 2014-10-16 DIAGNOSIS — Z8669 Personal history of other diseases of the nervous system and sense organs: Secondary | ICD-10-CM | POA: Diagnosis not present

## 2014-10-16 DIAGNOSIS — Z8719 Personal history of other diseases of the digestive system: Secondary | ICD-10-CM | POA: Insufficient documentation

## 2014-10-16 DIAGNOSIS — Z862 Personal history of diseases of the blood and blood-forming organs and certain disorders involving the immune mechanism: Secondary | ICD-10-CM | POA: Diagnosis not present

## 2014-10-16 DIAGNOSIS — R197 Diarrhea, unspecified: Secondary | ICD-10-CM | POA: Diagnosis not present

## 2014-10-16 HISTORY — DX: Sickle-cell disease without crisis: D57.1

## 2014-10-16 LAB — COMPREHENSIVE METABOLIC PANEL
ALBUMIN: 3.5 g/dL (ref 3.5–5.0)
ALK PHOS: 89 U/L (ref 38–126)
ALT: 13 U/L — AB (ref 14–54)
ANION GAP: 9 (ref 5–15)
AST: 13 U/L — ABNORMAL LOW (ref 15–41)
BUN: 11 mg/dL (ref 6–20)
CALCIUM: 9.2 mg/dL (ref 8.9–10.3)
CO2: 19 mmol/L — ABNORMAL LOW (ref 22–32)
Chloride: 112 mmol/L — ABNORMAL HIGH (ref 101–111)
Creatinine, Ser: 0.87 mg/dL (ref 0.44–1.00)
GFR calc Af Amer: >60 mL/min (ref >60)
GFR calc non Af Amer: >60 mL/min (ref >60)
GLUCOSE: 89 mg/dL (ref 65–99)
Potassium: 3.4 mmol/L — ABNORMAL LOW (ref 3.5–5.1)
Sodium: 140 mmol/L (ref 135–145)
Total Bilirubin: 0.4 mg/dL (ref 0.3–1.2)
Total Protein: 6.4 g/dL — ABNORMAL LOW (ref 6.5–8.1)

## 2014-10-16 LAB — URINALYSIS, ROUTINE W REFLEX MICROSCOPIC
BILIRUBIN URINE: NEGATIVE
Glucose, UA: NEGATIVE mg/dL
HGB URINE DIPSTICK: NEGATIVE
KETONES UR: 15 mg/dL — AB
Leukocytes, UA: NEGATIVE
NITRITE: NEGATIVE
Protein, ur: NEGATIVE mg/dL
Specific Gravity, Urine: 1.014 (ref 1.005–1.030)
UROBILINOGEN UA: 0.2 mg/dL (ref 0.0–1.0)
pH: 5.5 (ref 5.0–8.0)

## 2014-10-16 LAB — CBC WITH DIFFERENTIAL/PLATELET
BASOS PCT: 0 % (ref 0–1)
Basophils Absolute: 0 10*3/uL (ref 0.0–0.1)
EOS ABS: 0.1 10*3/uL (ref 0.0–0.7)
Eosinophils Relative: 2 % (ref 0–5)
HEMATOCRIT: 32.7 % — AB (ref 36.0–46.0)
HEMOGLOBIN: 11 g/dL — AB (ref 12.0–15.0)
Lymphocytes Relative: 43 % (ref 12–46)
Lymphs Abs: 3.7 10*3/uL (ref 0.7–4.0)
MCH: 27.6 pg (ref 26.0–34.0)
MCHC: 33.6 g/dL (ref 30.0–36.0)
MCV: 82.2 fL (ref 78.0–100.0)
MONOS PCT: 5 % (ref 3–12)
Monocytes Absolute: 0.5 10*3/uL (ref 0.1–1.0)
NEUTROS ABS: 4.1 10*3/uL (ref 1.7–7.7)
NEUTROS PCT: 50 % (ref 43–77)
Platelets: 246 10*3/uL (ref 150–400)
RBC: 3.98 MIL/uL (ref 3.87–5.11)
RDW: 13.3 % (ref 11.5–15.5)
WBC: 8.4 10*3/uL (ref 4.0–10.5)

## 2014-10-16 LAB — POC URINE PREG, ED: PREG TEST UR: NEGATIVE

## 2014-10-16 LAB — LIPASE, BLOOD: Lipase: 12 U/L — ABNORMAL LOW (ref 22–51)

## 2014-10-16 IMAGING — CT CT ABD-PELV W/ CM
2 of 4 series · 17 of 46 positions shown, 19 images · IV contrast (Omni 300)
Comparison: None.

CLINICAL DATA: Right upper and right lower quadrant pain beginning
the morning of [DATE].

EXAM:
CT ABDOMEN AND PELVIS WITH CONTRAST
TECHNIQUE: Multidetector CT imaging of the abdomen and pelvis was performed
using the standard protocol following bolus administration of
intravenous contrast.
CONTRAST:  100 mL OMNIPAQUE IOHEXOL 300 MG/ML  SOLN

[Series 2: abd/ pelvis 5.0 i30f 1 · axial · 0.78mm/px · z∈[+1005,+1425]mm · 14 of 92 slices shown, 16 images]
[im 4/92  soft-tissue]
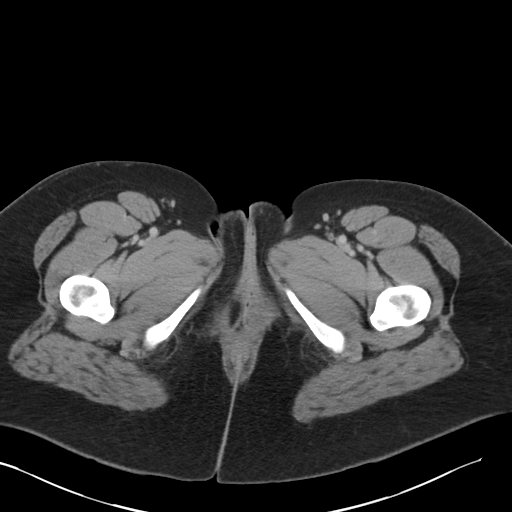
[im 4/92  bone]
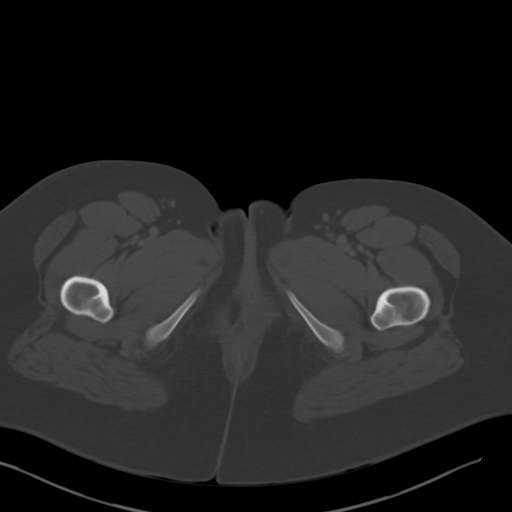
[im 12/92  soft-tissue]
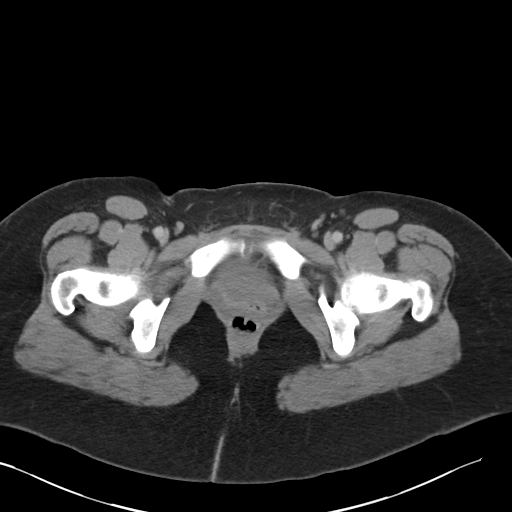
[im 19/92  soft-tissue]
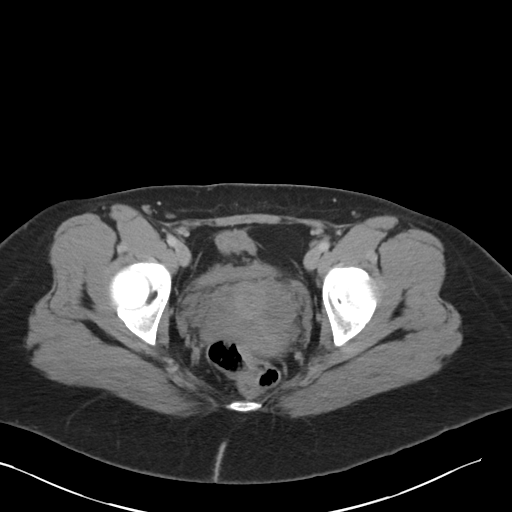
[im 23/92  soft-tissue]
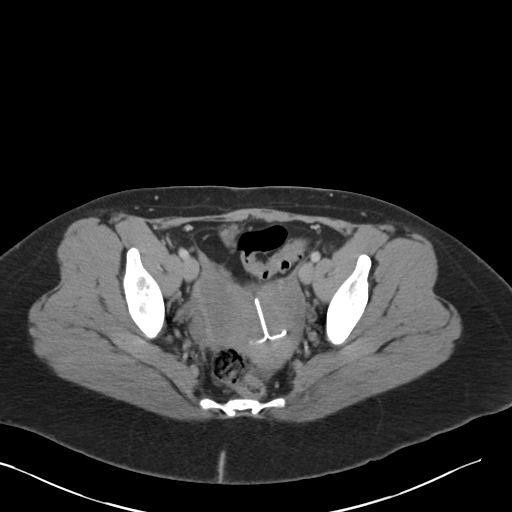
[im 31/92  soft-tissue]
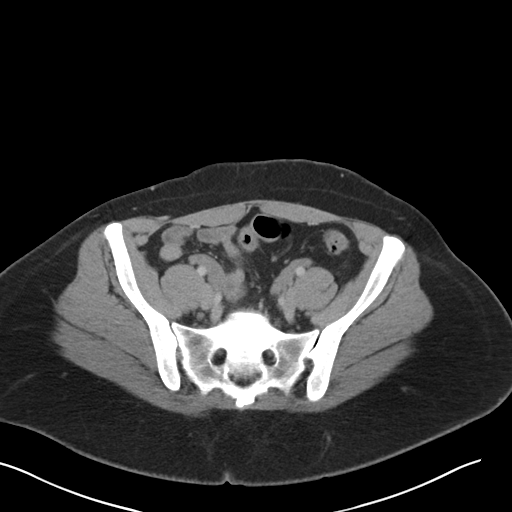
[im 38/92  soft-tissue]
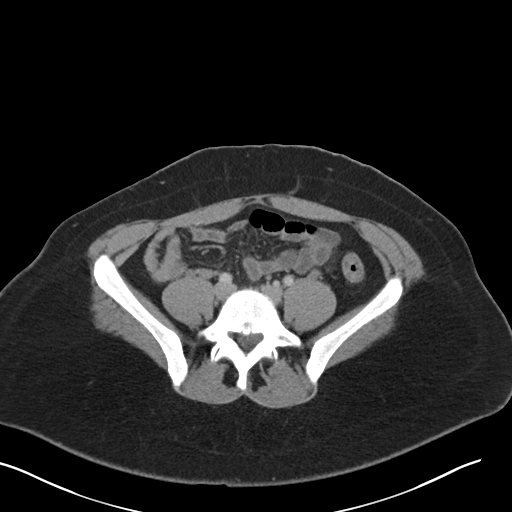
[im 42/92  soft-tissue]
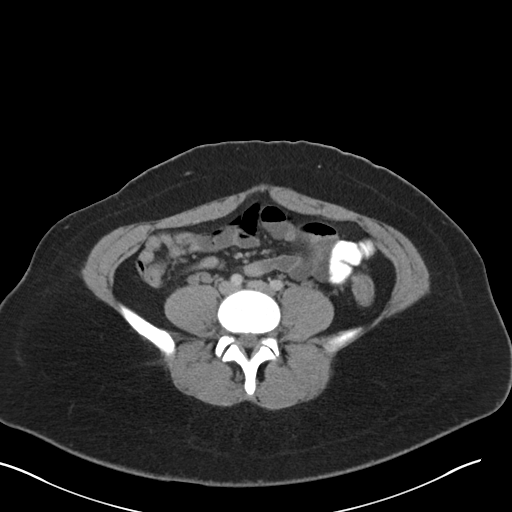
[im 50/92  soft-tissue]
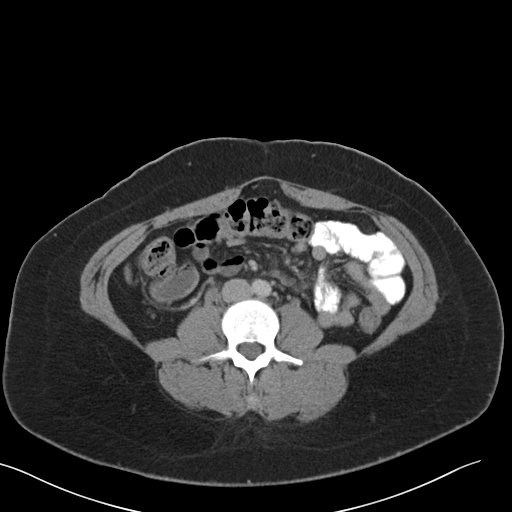
[im 54/92  soft-tissue]
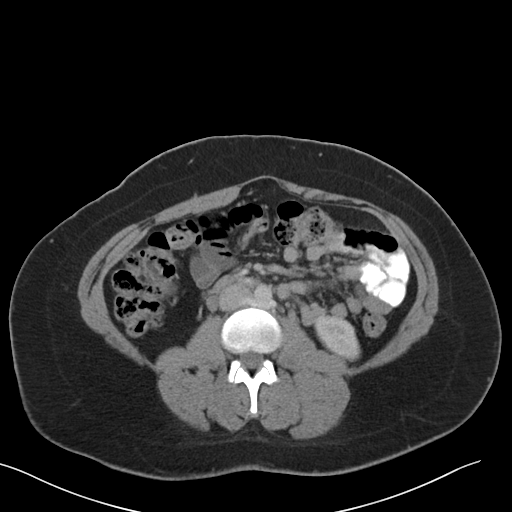
[im 54/92  bone]
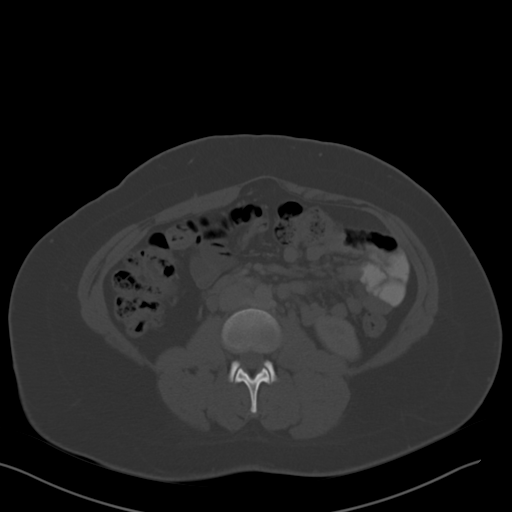
[im 61/92  soft-tissue]
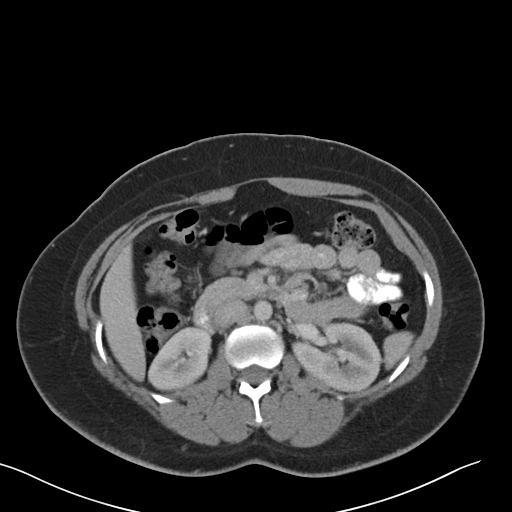
[im 69/92  soft-tissue]
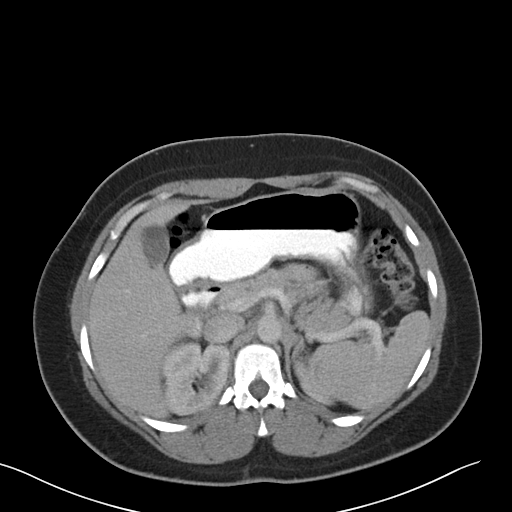
[im 73/92  soft-tissue]
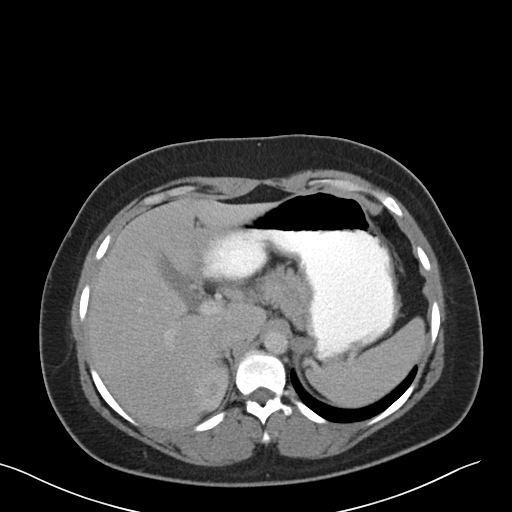
[im 80/92  soft-tissue]
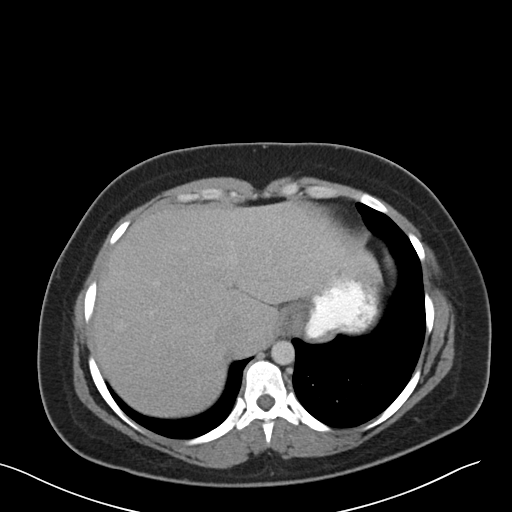
[im 88/92  soft-tissue]
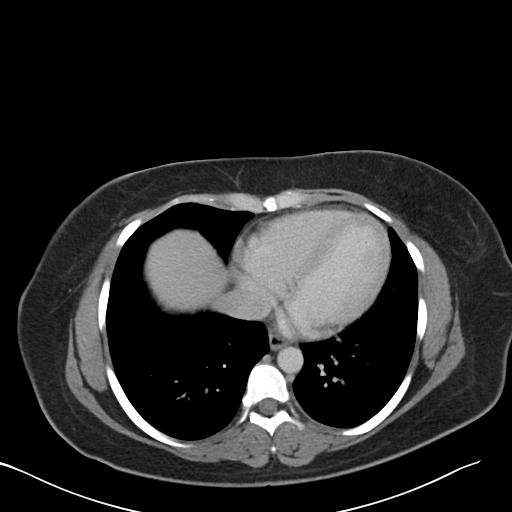

[Series 5: coronals · coronal · 0.76mm/px · 3 of 132 slices shown]
[im 44/132  soft-tissue]
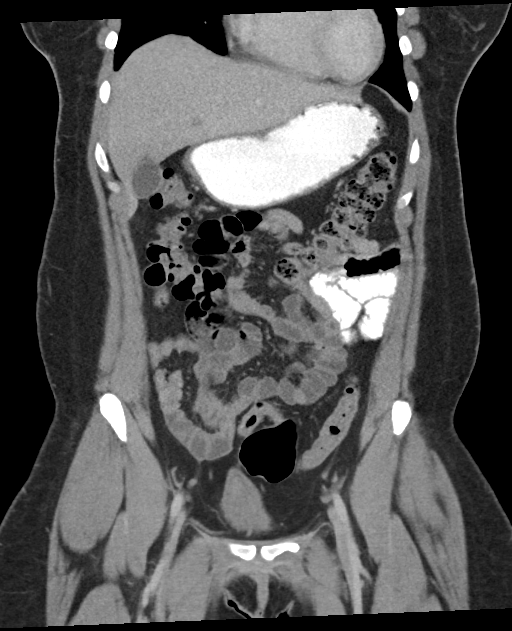
[im 59/132  soft-tissue]
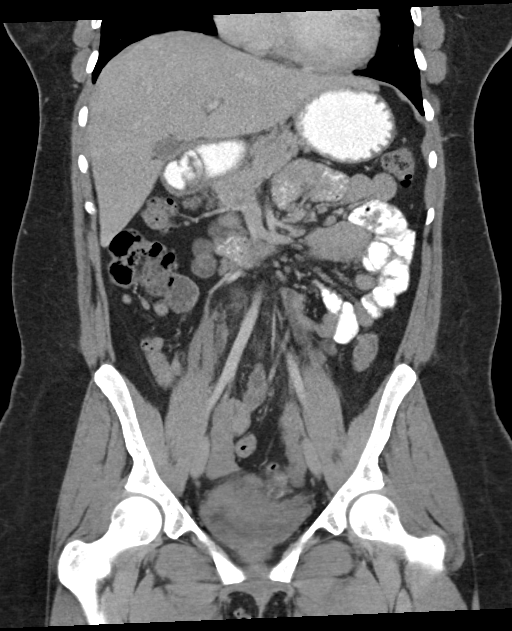
[im 73/132  soft-tissue]
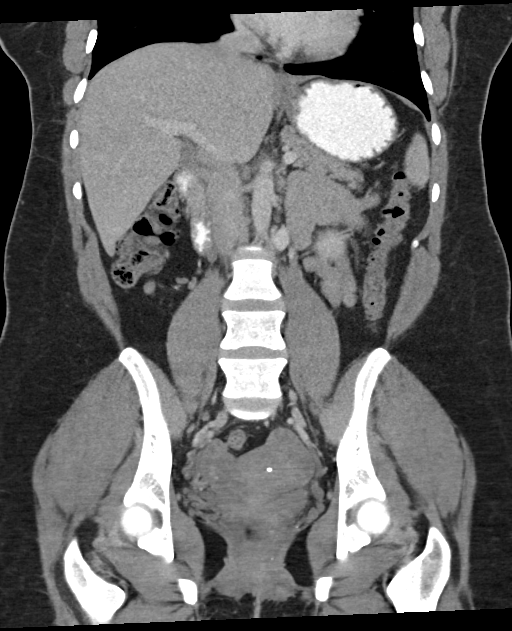

[17 of 46 positions shown; findings below may reference images not displayed]

FINDINGS: The lung bases are clear.  No pleural or pericardial effusion.

The gallbladder, liver, spleen, adrenal glands, pancreas and kidneys
are unremarkable.

IUD is in place in the uterus. Adnexa and urinary bladder are
unremarkable. The stomach, small and large bowel and appendix all
appear normal. Retroaortic left renal vein is incidentally noted.
The patient has a small fat containing umbilical hernia.

No bony abnormality is seen.
IMPRESSION: Negative for appendicitis. No acute abnormality or finding to
explain the patient's symptoms.

Small fat containing umbilical hernia.

## 2014-10-16 MED ORDER — HYDROMORPHONE HCL 1 MG/ML IJ SOLN
1.0000 mg | Freq: Once | INTRAMUSCULAR | Status: AC
  Administered 2014-10-16: 1 mg via INTRAVENOUS
  Filled 2014-10-16: qty 1

## 2014-10-16 MED ORDER — ONDANSETRON HCL 4 MG/2ML IJ SOLN
4.0000 mg | Freq: Once | INTRAMUSCULAR | Status: AC
  Administered 2014-10-16: 4 mg via INTRAVENOUS

## 2014-10-16 MED ORDER — ONDANSETRON HCL 4 MG/2ML IJ SOLN
4.0000 mg | Freq: Once | INTRAMUSCULAR | Status: AC
  Administered 2014-10-16: 4 mg via INTRAVENOUS
  Filled 2014-10-16: qty 2

## 2014-10-16 MED ORDER — ONDANSETRON 8 MG PO TBDP: 8.0000 mg | ORAL_TABLET | Freq: Three times a day (TID) | ORAL | Status: DC | PRN

## 2014-10-16 MED ORDER — IOHEXOL 300 MG/ML  SOLN
25.0000 mL | Freq: Once | INTRAMUSCULAR | Status: AC | PRN
  Administered 2014-10-16: 25 mL via ORAL

## 2014-10-16 MED ORDER — SODIUM CHLORIDE 0.9 % IV SOLN
Freq: Once | INTRAVENOUS | Status: AC
  Administered 2014-10-16: 17:00:00 via INTRAVENOUS

## 2014-10-16 MED ORDER — IOHEXOL 300 MG/ML  SOLN
100.0000 mL | Freq: Once | INTRAMUSCULAR | Status: AC | PRN
  Administered 2014-10-16: 100 mL via INTRAVENOUS

## 2014-10-16 MED ORDER — HYDROCODONE-ACETAMINOPHEN 5-325 MG PO TABS: 1.0000 | ORAL_TABLET | Freq: Four times a day (QID) | ORAL | Status: DC | PRN

## 2014-10-16 MED ORDER — ONDANSETRON HCL 4 MG/2ML IJ SOLN
INTRAMUSCULAR | Status: AC
  Filled 2014-10-16: qty 2

## 2014-10-16 NOTE — ED Provider Notes (Signed)
CSN: [id number]     Arrival date & time 10/16/14  1608 History   First MD Initiated Contact with Patient 10/16/14 1628     Chief Complaint  Patient presents with  . Abdominal Pain  . Emesis   (Consider location/radiation/quality/duration/timing/severity/associated sxs/prior Treatment) HPI Comments: 29 year old obese female complaining of abdominal pain that started yesterday. It radiated into her back. This morning she awoke at 6 AM and experienced nausea vomiting and diarrhea. She had vomiting 3-4 times. And just a few episodes of diarrhea. No vomiting in the past 6 to 7 hours. Denies seeing any blood in stool. No dark stools. She also has a headache. Denies fever, chest pain or shortness of breath. She does have pain in the abdomen upon taking a deep breath. She is also complaining of pain in the right leg that started at the same time her abdominal pain did yesterday. Pain is worse in the right leg with movement, standing and ambulation. Denies any known food or other causes. Her husband is here with similar sx's  The history she gave to me was read back to her as well as the response to exam of the abdomen,  And the pt confirmed as correct.  Patient is a 29 y.o. female presenting with abdominal pain.  Abdominal Pain Pain location:  Epigastric, RUQ and RLQ Pain quality: cramping, fullness and squeezing   Pain radiates to:  Back Pain severity:  Severe Onset quality:  Gradual Duration:  1 day Timing:  Constant Progression:  Worsening Chronicity:  New Relieved by:  Nothing Worsened by:  Coughing, movement, palpation and deep breathing Associated symptoms: diarrhea, nausea and vomiting   Associated symptoms: no chest pain, no constipation, no cough, no dysuria, no fever, no shortness of breath and no sore throat   Risk factors: obesity     Past Medical History  Diagnosis Date  . Family history of anesthesia complication     son due sleep apnea  . Sleep apnea     awaiting a sleep  study to be done at [place]  . Asthma   . Shortness of breath     with exertion or panic attack  . Bronchitis, acute   . PTSD (post-traumatic stress disorder)   . Anxiety   . Depression   . Diverticular disease   . Headache(784.0)     migraines  . Blood dyscrasia     sickle trait  . Anemia    Past Surgical History  Procedure Laterality Date  . Eye surgery      x 10 as a child  . 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 Cesarean section  2010, 2012,2014    x 3  . Breast surgery Bilateral     reductions  . Tonsillectomy N/A 10/07/2013    Procedure: TONSILLECTOMY;  Surgeon: Christia Reading, MD;  Location: [facility] OR;  Service: ENT;  Laterality: N/A;   History reviewed. No pertinent family history. Social History  Substance Use Topics  . Smoking status: Former Smoker -- 0.25 packs/day for 1 years    Types: Cigarettes    Quit date: 07/02/2013  . Smokeless tobacco: Never Used  . Alcohol Use: Yes     Comment: social   OB History    No data available     Review of Systems  Constitutional: Positive for activity change and appetite change. Negative for fever.  HENT: Negative for congestion and sore throat.   Eyes: Negative for visual disturbance.  Respiratory: Negative for cough, chest tightness and shortness of breath.   Cardiovascular: Negative for chest pain and leg swelling.  Gastrointestinal: Positive for nausea, vomiting, abdominal pain and diarrhea. Negative for constipation, blood in stool and abdominal distention.  Genitourinary: Negative.  Negative for dysuria and frequency.  Musculoskeletal: Positive for myalgias, back pain and gait problem. Negative for neck pain and neck stiffness.  Skin: Negative for rash.  Neurological: Positive for weakness and headaches. Negative for facial asymmetry and numbness.  Psychiatric/Behavioral: Negative for behavioral problems and confusion.    Allergies  Other; Nsaids; and Sulfa antibiotics  Home Medications   Prior to Admission  medications   Medication Sig Start Date End Date Taking? Authorizing Provider  ondansetron (ZOFRAN ODT) 4 MG disintegrating tablet Take 1 tablet (4 mg total) by mouth every 8 (eight) hours as needed for nausea. 08/01/14  Yes Eber Hong, MD  albuterol (PROVENTIL HFA;VENTOLIN HFA) 108 (90 BASE) MCG/ACT inhaler Inhale 2 puffs into the lungs every 6 (six) hours as needed for wheezing or shortness of breath.    Historical Provider, MD  amoxicillin-clavulanate (AUGMENTIN) 875-125 MG per tablet Take 1 tablet by mouth every 12 (twelve) hours. 08/01/14   Eber Hong, MD  HYDROcodone-acetaminophen (NORCO/VICODIN) 5-325 MG per tablet Take 2 tablets by mouth every 4 (four) hours as needed. 08/01/14   Eber Hong, MD   Meds Ordered and Administered this Visit   Medications  0.9 %  sodium chloride infusion ( Intravenous New Bag/Given 10/16/14 1726)  ondansetron (ZOFRAN) injection 4 mg (4 mg Intravenous Given 10/16/14 1727)    BP 118/79 mmHg  Pulse 95  Temp(Src) 98.4 F (36.9 C) (Oral)  Resp 20  SpO2 97% No data found.   Physical Exam  Constitutional: She appears well-developed and well-nourished.  Eyes: EOM are normal.  Neck: Normal range of motion.  Cardiovascular: Normal rate, regular rhythm and normal heart sounds.   Pulmonary/Chest: Effort normal and breath sounds normal. No respiratory distress. She has no wheezes. She has no rales.  Patient cries when taking a deep breath due to it exacerbating abdominal pain.  Abdominal: Bowel sounds are normal. She exhibits no distension. There is tenderness. There is guarding.  Percussion to epigastrium and RUQ and RLQ produces "severe pain". Light to moderate palpation reproduces the same pain.  Musculoskeletal: She exhibits no edema.  Lymphadenopathy:    She has no cervical adenopathy.  Neurological: She is alert. No cranial nerve deficit. She exhibits normal muscle tone.  Skin: Skin is warm and dry.  Psychiatric: She has a normal mood and affect.   Nursing note and vitals reviewed.   ED Course  Procedures (including critical care time)  Labs Review Labs Reviewed - No data to display  Imaging Review No results found.   Visual Acuity Review  Right Eye Distance:   Left Eye Distance:   Bilateral Distance:    Right Eye Near:   Left Eye Near:    Bilateral Near:         MDM   1. Non-intractable vomiting with nausea, vomiting of unspecified type   2. Diarrhea   3. Continuous severe abdominal pain    Patient is crying due to abdominal pain. Percussion to the epigastrium and right hemiabdomen produces right hemi-abdominal pain and tenderness. Light palpation produces pain in the epigastrium right upper quadrant right lower quadrant. She is crying with pain associated with the examination. She was advised to be transferred to the emergency department for evaluation of what she terms as  severe pain in the abdomen. She emphatically refuses transfer. Patient received an IV of normal saline as well as Zofran 4 mg IV. Although she is not currently having vomiting or abdominal pain is unchanged. She has decided to be transferred to the emergency department and will go by [facility].  Hayden Rasmussen, NP 10/16/14 1733  Hayden Rasmussen, NP 10/16/14 [phone]

## 2014-10-16 NOTE — ED Notes (Signed)
MD made aware.

## 2014-10-16 NOTE — ED Notes (Signed)
EMS here to pick pt up for transport 

## 2014-10-16 NOTE — Discharge Instructions (Signed)

## 2014-10-16 NOTE — ED Notes (Addendum)
[facility] arrives [facility]. Pt has c/o n/v and RUQ adb pain since yesterday morning. [facility] transferred pt to ED rt concerns of appendicitis. Received 1L of NS

## 2014-10-16 NOTE — ED Provider Notes (Addendum)
CSN: [id number]     Arrival date & time    History   First MD Initiated Contact with Patient 10/16/14 1758     Chief Complaint  Patient presents with  . Abdominal Pain  . Emesis     (Consider location/radiation/quality/duration/timing/severity/associated sxs/prior Treatment) Patient is a 29 y.o. female presenting with abdominal pain and vomiting. The history is provided by the patient.  Abdominal Pain Pain location:  RUQ and RLQ Pain quality: sharp, shooting and stabbing   Pain radiates to:  R flank Pain severity:  Severe Onset quality:  Gradual Duration:  24 hours Timing:  Constant Progression:  Worsening Chronicity:  New Context: sick contacts   Context: not eating and not recent travel   Relieved by:  Nothing Worsened by:  Not moving (walking) Associated symptoms: anorexia, chills, diarrhea, nausea, shortness of breath and vomiting   Associated symptoms: no cough, no dysuria, no vaginal bleeding and no vaginal discharge   Associated symptoms comment:  Pain is worse with walking and taking deep breaths Risk factors: has not had multiple surgeries, no NSAID use and not obese   Emesis Associated symptoms: abdominal pain, chills and diarrhea     Past Medical History  Diagnosis Date  . Family history of anesthesia complication     son due sleep apnea  . Sleep apnea     awaiting a sleep study to be done at [place]  . Asthma   . Shortness of breath     with exertion or panic attack  . Bronchitis, acute   . PTSD (post-traumatic stress disorder)   . Anxiety   . Depression   . Diverticular disease   . Headache(784.0)     migraines  . Blood dyscrasia     sickle trait  . Anemia    Past Surgical History  Procedure Laterality Date  . Eye surgery      x 10 as a child  . 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 Cesarean section  2010, 2012,2014    x 3  . Breast surgery Bilateral     reductions  . Tonsillectomy N/A 10/07/2013    Procedure: TONSILLECTOMY;  Surgeon:  Christia Reading, MD;  Location: [facility] OR;  Service: ENT;  Laterality: N/A;   No family history on file. Social History  Substance Use Topics  . Smoking status: Former Smoker -- 0.25 packs/day for 1 years    Types: Cigarettes    Quit date: 07/02/2013  . Smokeless tobacco: Never Used  . Alcohol Use: Yes     Comment: social   OB History    No data available     Review of Systems  Constitutional: Positive for chills.  Respiratory: Positive for shortness of breath. Negative for cough.   Gastrointestinal: Positive for nausea, vomiting, abdominal pain, diarrhea and anorexia.  Genitourinary: Negative for dysuria, vaginal bleeding and vaginal discharge.  All other systems reviewed and are negative.     Allergies  Other; Nsaids; and Sulfa antibiotics  Home Medications   Prior to Admission medications   Medication Sig Start Date End Date Taking? Authorizing Provider  albuterol (PROVENTIL HFA;VENTOLIN HFA) 108 (90 BASE) MCG/ACT inhaler Inhale 2 puffs into the lungs every 6 (six) hours as needed for wheezing or shortness of breath.   Yes Historical Provider, MD  amoxicillin-clavulanate (AUGMENTIN) 875-125 MG per tablet Take 1 tablet by mouth every 12 (twelve) hours. Patient not taking: Reported on 10/16/2014 08/01/14   Eber Hong, MD  HYDROcodone-acetaminophen (NORCO/VICODIN) 5-325 MG per tablet Take 2 tablets by mouth every 4 (four) hours as needed. Patient not taking: Reported on 10/16/2014 08/01/14   Eber Hong, MD  ondansetron (ZOFRAN ODT) 4 MG disintegrating tablet Take 1 tablet (4 mg total) by mouth every 8 (eight) hours as needed for nausea. Patient not taking: Reported on 10/16/2014 08/01/14   Eber Hong, MD   BP 120/79 mmHg  Pulse 80  Temp(Src) 98.2 F (36.8 C) (Oral)  Resp 20  SpO2 99% Physical Exam  Constitutional: She is oriented to person, place, and time. She appears well-developed and well-nourished. She appears distressed.  Appears in pain  HENT:  Head: Normocephalic and  atraumatic.  Mouth/Throat: Oropharynx is clear and moist.  Eyes: Conjunctivae and EOM are normal. Pupils are equal, round, and reactive to light.  Neck: Normal range of motion. Neck supple.  Cardiovascular: Normal rate, regular rhythm and intact distal pulses.   No murmur heard. Pulmonary/Chest: Effort normal and breath sounds normal. No respiratory distress. She has no wheezes. She has no rales.  Abdominal: Soft. Normal appearance. She exhibits no distension. There is tenderness in the right upper quadrant and right lower quadrant. There is rebound and guarding. There is no CVA tenderness. No hernia. Hernia confirmed negative in the ventral area.  Musculoskeletal: Normal range of motion. She exhibits no edema or tenderness.  Neurological: She is alert and oriented to person, place, and time.  Skin: Skin is warm and dry. No rash noted. No erythema.  Psychiatric: She has a normal mood and affect. Her behavior is normal.  Nursing note and vitals reviewed.   ED Course  Procedures (including critical care time) Labs Review Labs Reviewed  CBC WITH DIFFERENTIAL/PLATELET - Abnormal; Notable for the following:    Hemoglobin 11.0 (*)    HCT 32.7 (*)    All other components within normal limits  COMPREHENSIVE METABOLIC PANEL - Abnormal; Notable for the following:    Potassium 3.4 (*)    Chloride 112 (*)    CO2 19 (*)    Total Protein 6.4 (*)    AST 13 (*)    ALT 13 (*)    All other components within normal limits  LIPASE, BLOOD - Abnormal; Notable for the following:    Lipase 12 (*)    All other components within normal limits  URINALYSIS, ROUTINE W REFLEX MICROSCOPIC (NOT AT [facility]) - Abnormal; Notable for the following:    Ketones, ur 15 (*)    All other components within normal limits  POC URINE PREG, ED    Imaging Review Ct Abdomen Pelvis W Contrast  10/16/2014   CLINICAL DATA:  Right upper and right lower quadrant pain beginning the morning of 10/15/2014.  EXAM: CT ABDOMEN AND  PELVIS WITH CONTRAST  TECHNIQUE: Multidetector CT imaging of the abdomen and pelvis was performed using the standard protocol following bolus administration of intravenous contrast.  CONTRAST:  100 mL OMNIPAQUE IOHEXOL 300 MG/ML  SOLN  COMPARISON:  None.  FINDINGS: The lung bases are clear.  No pleural or pericardial effusion.  The gallbladder, liver, spleen, adrenal glands, pancreas and kidneys are unremarkable.  IUD is in place in the uterus. Adnexa and urinary bladder are unremarkable. The stomach, small and large bowel and appendix all appear normal. Retroaortic left renal vein is incidentally noted. The patient has a small fat containing umbilical hernia.  No bony abnormality is seen.  IMPRESSION: Negative for appendicitis. No acute abnormality or finding to explain the patient's symptoms.  Small fat containing umbilical hernia.   Electronically Signed   By: Drusilla Kanner M.D.   On: 10/16/2014 20:08   I have personally reviewed and evaluated these images and lab results as part of my medical decision-making.   EKG Interpretation None      MDM   Final diagnoses:  Right lower quadrant abdominal pain  Vomiting and diarrhea    Patient is a 29 year old female being transferred for urgent care for severe abdominal pain. Yesterday she developed symptoms that sounded more like a gastroenteritis however she's had severe right-sided upper, lower quadrant pain as well as back pain that is persisting. Afebrile and normal vital signs. Patient is exquisitely tender most notably in the right lower quadrant. UA done at urgent care was within normal limits without any evidence of blood. UPT was negative and patient has an IUD. Concern for possible gallbladder versus appendicitis. CBC, CMP, lipase, CT abdomen and pelvis pending. Patient given pain control.  8:04 PM Labs without acute findings.  CT pending.  8:47 PM CT without acute findings. Labs are all within normal limits. Patient states she has  sickle cell trait but often gets admitted for sickle cell crises. That's most likely the reason for her persistent pain. She was given pain medication and she was discharged home.   Gwyneth Sprout, MD 10/16/14 1610  Gwyneth Sprout, MD 10/16/14 2051

## 2014-10-16 NOTE — ED Notes (Signed)
[facility] called to transport pt to ED for further evaluation for RLQ pain and vomiting.  Report was called to the Charge RN, Todd at the ED.

## 2014-10-16 NOTE — ED Notes (Signed)
Pt has been suffering from abdominal cramping and N&V since yesterday morning.  Her b/f is here with same symptoms that started this morning.

## 2014-10-16 NOTE — ED Notes (Signed)
MD Pickering at the bedside 

## 2015-01-27 ENCOUNTER — Emergency Department (HOSPITAL_COMMUNITY)
Admission: EM | Admit: 2015-01-27 | Discharge: 2015-01-27 | Disposition: A | Discharge status: Home / Self Care | Payer: Medicaid Other | Attending: Emergency Medicine | Admitting: Emergency Medicine

## 2015-01-27 ENCOUNTER — Encounter (HOSPITAL_COMMUNITY): Payer: Self-pay | Admitting: *Deleted

## 2015-01-27 DIAGNOSIS — Z8719 Personal history of other diseases of the digestive system: Secondary | ICD-10-CM | POA: Diagnosis not present

## 2015-01-27 DIAGNOSIS — Z79899 Other long term (current) drug therapy: Secondary | ICD-10-CM | POA: Insufficient documentation

## 2015-01-27 DIAGNOSIS — H5713 Ocular pain, bilateral: Secondary | ICD-10-CM | POA: Diagnosis not present

## 2015-01-27 DIAGNOSIS — Z87891 Personal history of nicotine dependence: Secondary | ICD-10-CM | POA: Insufficient documentation

## 2015-01-27 DIAGNOSIS — Z8659 Personal history of other mental and behavioral disorders: Secondary | ICD-10-CM | POA: Diagnosis not present

## 2015-01-27 DIAGNOSIS — J45909 Unspecified asthma, uncomplicated: Secondary | ICD-10-CM | POA: Insufficient documentation

## 2015-01-27 DIAGNOSIS — H578 Other specified disorders of eye and adnexa: Secondary | ICD-10-CM | POA: Diagnosis present

## 2015-01-27 DIAGNOSIS — Z862 Personal history of diseases of the blood and blood-forming organs and certain disorders involving the immune mechanism: Secondary | ICD-10-CM | POA: Diagnosis not present

## 2015-01-27 DIAGNOSIS — Z8679 Personal history of other diseases of the circulatory system: Secondary | ICD-10-CM | POA: Insufficient documentation

## 2015-01-27 MED ORDER — PROPARACAINE HCL 0.5 % OP SOLN: 1.0000 [drp] | Freq: Once | OPHTHALMIC | Status: DC

## 2015-01-27 MED ORDER — POLYMYXIN B-TRIMETHOPRIM 10000-0.1 UNIT/ML-% OP SOLN: 1.0000 [drp] | OPHTHALMIC | Status: DC

## 2015-01-27 MED ORDER — FLUORESCEIN SODIUM 1 MG OP STRP
2.0000 | ORAL_STRIP | Freq: Once | OPHTHALMIC | Status: DC
  Filled 2015-01-27: qty 2

## 2015-01-27 MED ORDER — TRAMADOL HCL 50 MG PO TABS: 50.0000 mg | ORAL_TABLET | Freq: Four times a day (QID) | ORAL | Status: DC | PRN

## 2015-01-27 NOTE — ED Provider Notes (Signed)
CSN: [id number]     Arrival date & time 01/27/15  1050 History   First MD Initiated Contact with Patient 01/27/15 1117     Chief Complaint  Patient presents with  . Eye Pain  . Eye Drainage     (Consider location/radiation/quality/duration/timing/severity/associated sxs/prior Treatment) HPI Comments: Patient presents with bilateral eye pain, crusting starting in her right eye now spreading to her left eye occurring over the past 2 days. + photophobia. She had a history of eye surgery (unclear details) as a child. She describes the pain is in the globes. She had to take a Vicodin that she had left over from a bout of diverticulitis to help with the pain. She describes blurry vision but no loss of vision. No eye injuries or foreign bodies/exposures. Patient reports fever to nursing, however does not to me. The onset of this condition was acute. The course is constant.   The history is provided by the patient.    Past Medical History  Diagnosis Date  . Family history of anesthesia complication     son due sleep apnea  . Sleep apnea     [place]  . Asthma   . Shortness of breath     with exertion or panic attack  . Bronchitis, acute   . PTSD (post-traumatic stress disorder)   . Anxiety   . Depression   . Diverticular disease   . Headache(784.0)     migraines  . Blood dyscrasia     sickle trait  . Anemia   . Sickle cell anemia (HCC) trait    patient claims asymptomatic   Past Surgical History  Procedure Laterality Date  . Eye surgery      x 10 as a child  . 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 Cesarean section  2010, 2012,2014    x 3  . Breast surgery Bilateral     reductions  . Tonsillectomy N/A 10/07/2013    Procedure: TONSILLECTOMY;  Surgeon: Christia Readingwight Bates, MD;  [facility];  Service: ENT;  Laterality: N/A;   No family history on file. Social History  Substance Use Topics  . Smoking status: Former Smoker -- 0.25 packs/day  for 1 years    Types: Cigarettes    Quit date: 07/02/2013  . Smokeless tobacco: Never Used  . Alcohol Use: Yes     Comment: social   OB History    No data available     Review of Systems  Constitutional: Negative for fever.  HENT: Negative for rhinorrhea and sore throat.   Eyes: Positive for photophobia, pain, discharge, itching and visual disturbance. Negative for redness.  Respiratory: Negative for cough.   Cardiovascular: Negative for chest pain.  Gastrointestinal: Negative for nausea, vomiting, abdominal pain and diarrhea.  Genitourinary: Negative for dysuria.  Musculoskeletal: Negative for myalgias.  Skin: Negative for rash.  Neurological: Negative for headaches.      Allergies  Other; Nsaids; and Sulfa antibiotics  Home Medications   Prior to Admission medications   Medication Sig Start Date End Date Taking? Authorizing Provider  albuterol (PROVENTIL HFA;VENTOLIN HFA) 108 (90 BASE) MCG/ACT inhaler Inhale 2 puffs into the lungs every 6 (six) hours as needed for wheezing or shortness of breath.    Historical Provider, MD  amoxicillin-clavulanate (AUGMENTIN) 875-125 MG per tablet Take 1 tablet by mouth every 12 (twelve) hours. Patient not taking: Reported on 10/16/2014 08/01/14   Eber HongBrian Miller,  MD  HYDROcodone-acetaminophen (NORCO/VICODIN) 5-325 MG per tablet Take 1-2 tablets by mouth every 6 (six) hours as needed. 10/16/14   Gwyneth Sprout, MD  ondansetron (ZOFRAN ODT) 8 MG disintegrating tablet Take 1 tablet (8 mg total) by mouth every 8 (eight) hours as needed for nausea or vomiting. 10/16/14   Gwyneth Sprout, MD   BP 126/90 mmHg  Pulse 78  Temp(Src) 98.6 F (37 C) (Oral)  Resp 18  Wt 95.936 kg  SpO2 100% Physical Exam  Constitutional: She appears well-developed and well-nourished.  HENT:  Head: Normocephalic and atraumatic.  Right Ear: Tympanic membrane, external ear and ear canal normal.  Left Ear: Tympanic membrane, external ear and ear canal normal.  Nose:  Nose normal. No mucosal edema or rhinorrhea.  Mouth/Throat: Uvula is midline, oropharynx is clear and moist and mucous membranes are normal. Mucous membranes are not dry. No oral lesions. No trismus in the jaw. No uvula swelling. No oropharyngeal exudate, posterior oropharyngeal edema, posterior oropharyngeal erythema or tonsillar abscesses.  Eyes: Pupils are equal, round, and reactive to light. Right eye exhibits discharge (slight). Left eye exhibits discharge (slight). Right conjunctiva is injected (mild). Left conjunctiva is injected (mild).  Slit lamp exam:      The right eye shows no corneal abrasion, no corneal flare, no corneal ulcer, no foreign body, no fluorescein uptake and no anterior chamber bulge.       The left eye shows no corneal abrasion, no corneal flare, no corneal ulcer, no foreign body, no fluorescein uptake and no anterior chamber bulge.  Full ROM eyes bilaterally with minimal pain.  Neck: Normal range of motion. Neck supple.  Cardiovascular: Normal rate, regular rhythm and normal heart sounds.   Pulmonary/Chest: Effort normal and breath sounds normal. No respiratory distress. She has no wheezes. She has no rales.  Abdominal: Soft. There is no tenderness.  Lymphadenopathy:    She has no cervical adenopathy.  Neurological: She is alert.  Skin: Skin is warm and dry.  Psychiatric: She has a normal mood and affect.  Nursing note and vitals reviewed.   ED Course  Procedures (including critical care time) Labs Review Labs Reviewed - No data to display  Imaging Review No results found. I have personally reviewed and evaluated these images and lab results as part of my medical decision-making.   EKG Interpretation None       11:57 AM Patient seen and examined. Medications ordered.   Vital signs reviewed and are as follows: BP 126/90 mmHg  Pulse 78  Temp(Src) 98.6 F (37 C) (Oral)  Resp 18  Wt 95.936 kg  SpO2 100%  Two drops of tetracaine instilled into eyes.    Fluorescein strip applied to affected eye. Wood's lamp used to assess for corneal abrasion. No corneal abrasion identified. No foreign bodies noted. No visible hyphema.   Tonometry performed. Right eye pressure: 20 Left eye pressure: 18  Patient tolerated procedure well without immediate complication.     Visual Acuity  Right Eye Distance: 20/50 Left Eye Distance: 20/40 Bilateral Distance: 20/50  Right Eye Near:   Left Eye Near:    Bilateral Near:      Tramadol for pain. Polytrim to cover for infection. Patient will need ophthalmologic referral for further eval. Encouraged to return to the emergency department with worsening symptoms, loss of vision. Urged call ophthalmologist to follow-up in the next 1 day. Patient verbalizes understanding and agrees with plan.    MDM   Final diagnoses:  Eye pain, bilateral  Patient with bilateral eye pain or photophobia. No obvious infection. No foreign bodies noted. No surrounding erythema, swelling, vision changes/loss suspicious for orbital or periorbital cellulitis. No clinical signs or history suggesting iritis. No signs of glaucoma, intraocular pressures normal. No symptoms of retinal detachment. No ophthalmologic emergency suspected. Outpatient referral given.     Renne Crigler, PA-C 01/27/15 1252  Tilden Fossa, MD 01/28/15 [phone]

## 2015-01-27 NOTE — ED Notes (Signed)
Pt was comes in with c/o swelling and redness to right eye with yellow drainage in the morning that is crusted over x 2 days.  Pt says she has had intermittent fever and has blurry vision from eyes.  Pt says she has not had any injury to eye.  Pt with history of eye surgeries as a child.  Pt ambulatory.

## 2015-01-27 NOTE — Discharge Instructions (Signed)
Please read and follow all provided instructions.  Your diagnoses today include:  1. Eye pain, bilateral    Tests performed today include:  Visual acuity testing to check your vision  Fluorescein dye examination to look for scratches on your eye  Tonometry to check the pressure inside of your eye  Vital signs. See below for your results today.   Medications prescribed:   Polytrim (polymyxin B/trimethoprim) - antibiotic eye drops  Use this medication as follows:  Use 1 drop in affected eye every 4 hours while awake for 10 days. Do not exceed 6 doses in 24 hours.   Tramadol - narcotic-like pain medication  DO NOT drive or perform any activities that require you to be awake and alert because this medicine can make you drowsy.   Take any prescribed medications only as directed.  Home care instructions:  Follow any educational materials contained in this packet. If you wear contact lenses, do not use them until your eye caregiver approves. Follow-up care is necessary to be sure the infection is healing if not completely resolved in 2-3 days. See your caregiver or eye specialist as suggested for followup.   If you have an eye infection, wash your hands often as this is very contagious and is easily spread from person to person.   Follow-up instructions: Please follow-up with the opthalmologist listed in the next 1-2 days for further evaluation of your symptoms.  Return instructions:   Please return to the Emergency Department if you experience worsening symptoms.   Please return immediately if you develop severe pain, pus drainage, new change in vision, or fever.  Please return if you have any other emergent concerns.  Additional Information:  Your vital signs today were: BP 126/90 mmHg   Pulse 78   Temp(Src) 98.6 F (37 C) (Oral)   Resp 18   Wt 95.936 kg   SpO2 100% If your blood pressure (BP) was elevated above 135/85 this visit, please have this repeated by your doctor  within one month. ---------------

## 2015-04-05 ENCOUNTER — Encounter (HOSPITAL_COMMUNITY): Payer: Self-pay | Admitting: *Deleted

## 2015-04-05 ENCOUNTER — Emergency Department (HOSPITAL_COMMUNITY)
Admission: EM | Admit: 2015-04-05 | Discharge: 2015-04-05 | Disposition: A | Discharge status: Home / Self Care | Payer: Medicaid Other | Attending: Emergency Medicine | Admitting: Emergency Medicine

## 2015-04-05 ENCOUNTER — Emergency Department (HOSPITAL_COMMUNITY): Payer: Medicaid Other

## 2015-04-05 DIAGNOSIS — R059 Cough, unspecified: Secondary | ICD-10-CM

## 2015-04-05 DIAGNOSIS — Z87891 Personal history of nicotine dependence: Secondary | ICD-10-CM | POA: Diagnosis not present

## 2015-04-05 DIAGNOSIS — J45909 Unspecified asthma, uncomplicated: Secondary | ICD-10-CM | POA: Diagnosis not present

## 2015-04-05 DIAGNOSIS — Z8659 Personal history of other mental and behavioral disorders: Secondary | ICD-10-CM | POA: Diagnosis not present

## 2015-04-05 DIAGNOSIS — Z79899 Other long term (current) drug therapy: Secondary | ICD-10-CM | POA: Insufficient documentation

## 2015-04-05 DIAGNOSIS — R05 Cough: Secondary | ICD-10-CM | POA: Diagnosis not present

## 2015-04-05 DIAGNOSIS — Z8679 Personal history of other diseases of the circulatory system: Secondary | ICD-10-CM | POA: Diagnosis not present

## 2015-04-05 DIAGNOSIS — Z8719 Personal history of other diseases of the digestive system: Secondary | ICD-10-CM | POA: Diagnosis not present

## 2015-04-05 DIAGNOSIS — M791 Myalgia: Secondary | ICD-10-CM | POA: Diagnosis not present

## 2015-04-05 DIAGNOSIS — R197 Diarrhea, unspecified: Secondary | ICD-10-CM | POA: Insufficient documentation

## 2015-04-05 DIAGNOSIS — R112 Nausea with vomiting, unspecified: Secondary | ICD-10-CM | POA: Diagnosis not present

## 2015-04-05 DIAGNOSIS — Z862 Personal history of diseases of the blood and blood-forming organs and certain disorders involving the immune mechanism: Secondary | ICD-10-CM | POA: Insufficient documentation

## 2015-04-05 DIAGNOSIS — R52 Pain, unspecified: Secondary | ICD-10-CM

## 2015-04-05 DIAGNOSIS — Z8669 Personal history of other diseases of the nervous system and sense organs: Secondary | ICD-10-CM | POA: Diagnosis not present

## 2015-04-05 DIAGNOSIS — Z3202 Encounter for pregnancy test, result negative: Secondary | ICD-10-CM | POA: Diagnosis not present

## 2015-04-05 LAB — CBC WITH DIFFERENTIAL/PLATELET
BASOS ABS: 0 10*3/uL (ref 0.0–0.1)
Basophils Relative: 0 %
EOS PCT: 0 %
Eosinophils Absolute: 0 10*3/uL (ref 0.0–0.7)
HEMATOCRIT: 37.1 % (ref 36.0–46.0)
Hemoglobin: 12.4 g/dL (ref 12.0–15.0)
LYMPHS ABS: 1.5 10*3/uL (ref 0.7–4.0)
LYMPHS PCT: 17 %
MCH: 27.7 pg (ref 26.0–34.0)
MCHC: 33.4 g/dL (ref 30.0–36.0)
MCV: 83 fL (ref 78.0–100.0)
MONO ABS: 0.6 10*3/uL (ref 0.1–1.0)
Monocytes Relative: 7 %
NEUTROS ABS: 6.6 10*3/uL (ref 1.7–7.7)
Neutrophils Relative %: 76 %
PLATELETS: 249 10*3/uL (ref 150–400)
RBC: 4.47 MIL/uL (ref 3.87–5.11)
RDW: 13.3 % (ref 11.5–15.5)
WBC: 8.8 10*3/uL (ref 4.0–10.5)

## 2015-04-05 LAB — URINALYSIS, ROUTINE W REFLEX MICROSCOPIC
BILIRUBIN URINE: NEGATIVE
GLUCOSE, UA: NEGATIVE mg/dL
HGB URINE DIPSTICK: NEGATIVE
KETONES UR: NEGATIVE mg/dL
Leukocytes, UA: NEGATIVE
Nitrite: NEGATIVE
PROTEIN: NEGATIVE mg/dL
Specific Gravity, Urine: 1.017 (ref 1.005–1.030)
pH: 6 (ref 5.0–8.0)

## 2015-04-05 LAB — COMPREHENSIVE METABOLIC PANEL
ALT: 15 U/L (ref 14–54)
AST: 15 U/L (ref 15–41)
Albumin: 3.6 g/dL (ref 3.5–5.0)
Alkaline Phosphatase: 89 U/L (ref 38–126)
Anion gap: 14 (ref 5–15)
BILIRUBIN TOTAL: 0.6 mg/dL (ref 0.3–1.2)
BUN: 10 mg/dL (ref 6–20)
CALCIUM: 9.5 mg/dL (ref 8.9–10.3)
CHLORIDE: 104 mmol/L (ref 101–111)
CO2: 22 mmol/L (ref 22–32)
CREATININE: 0.96 mg/dL (ref 0.44–1.00)
Glucose, Bld: 119 mg/dL — ABNORMAL HIGH (ref 65–99)
Potassium: 3.5 mmol/L (ref 3.5–5.1)
Sodium: 140 mmol/L (ref 135–145)
TOTAL PROTEIN: 7.3 g/dL (ref 6.5–8.1)

## 2015-04-05 LAB — I-STAT BETA HCG BLOOD, ED (MC, WL, AP ONLY)

## 2015-04-05 LAB — LIPASE, BLOOD: LIPASE: 17 U/L (ref 11–51)

## 2015-04-05 IMAGING — DX DG CHEST 2V
2 series · 2 of 2 positions shown · non-contrast
Comparison: [DATE]

CLINICAL DATA: Productive cough with low-grade fever since last
night. Shortness of breath with nausea and vomiting.

EXAM:
CHEST  2 VIEW

[chest pa]
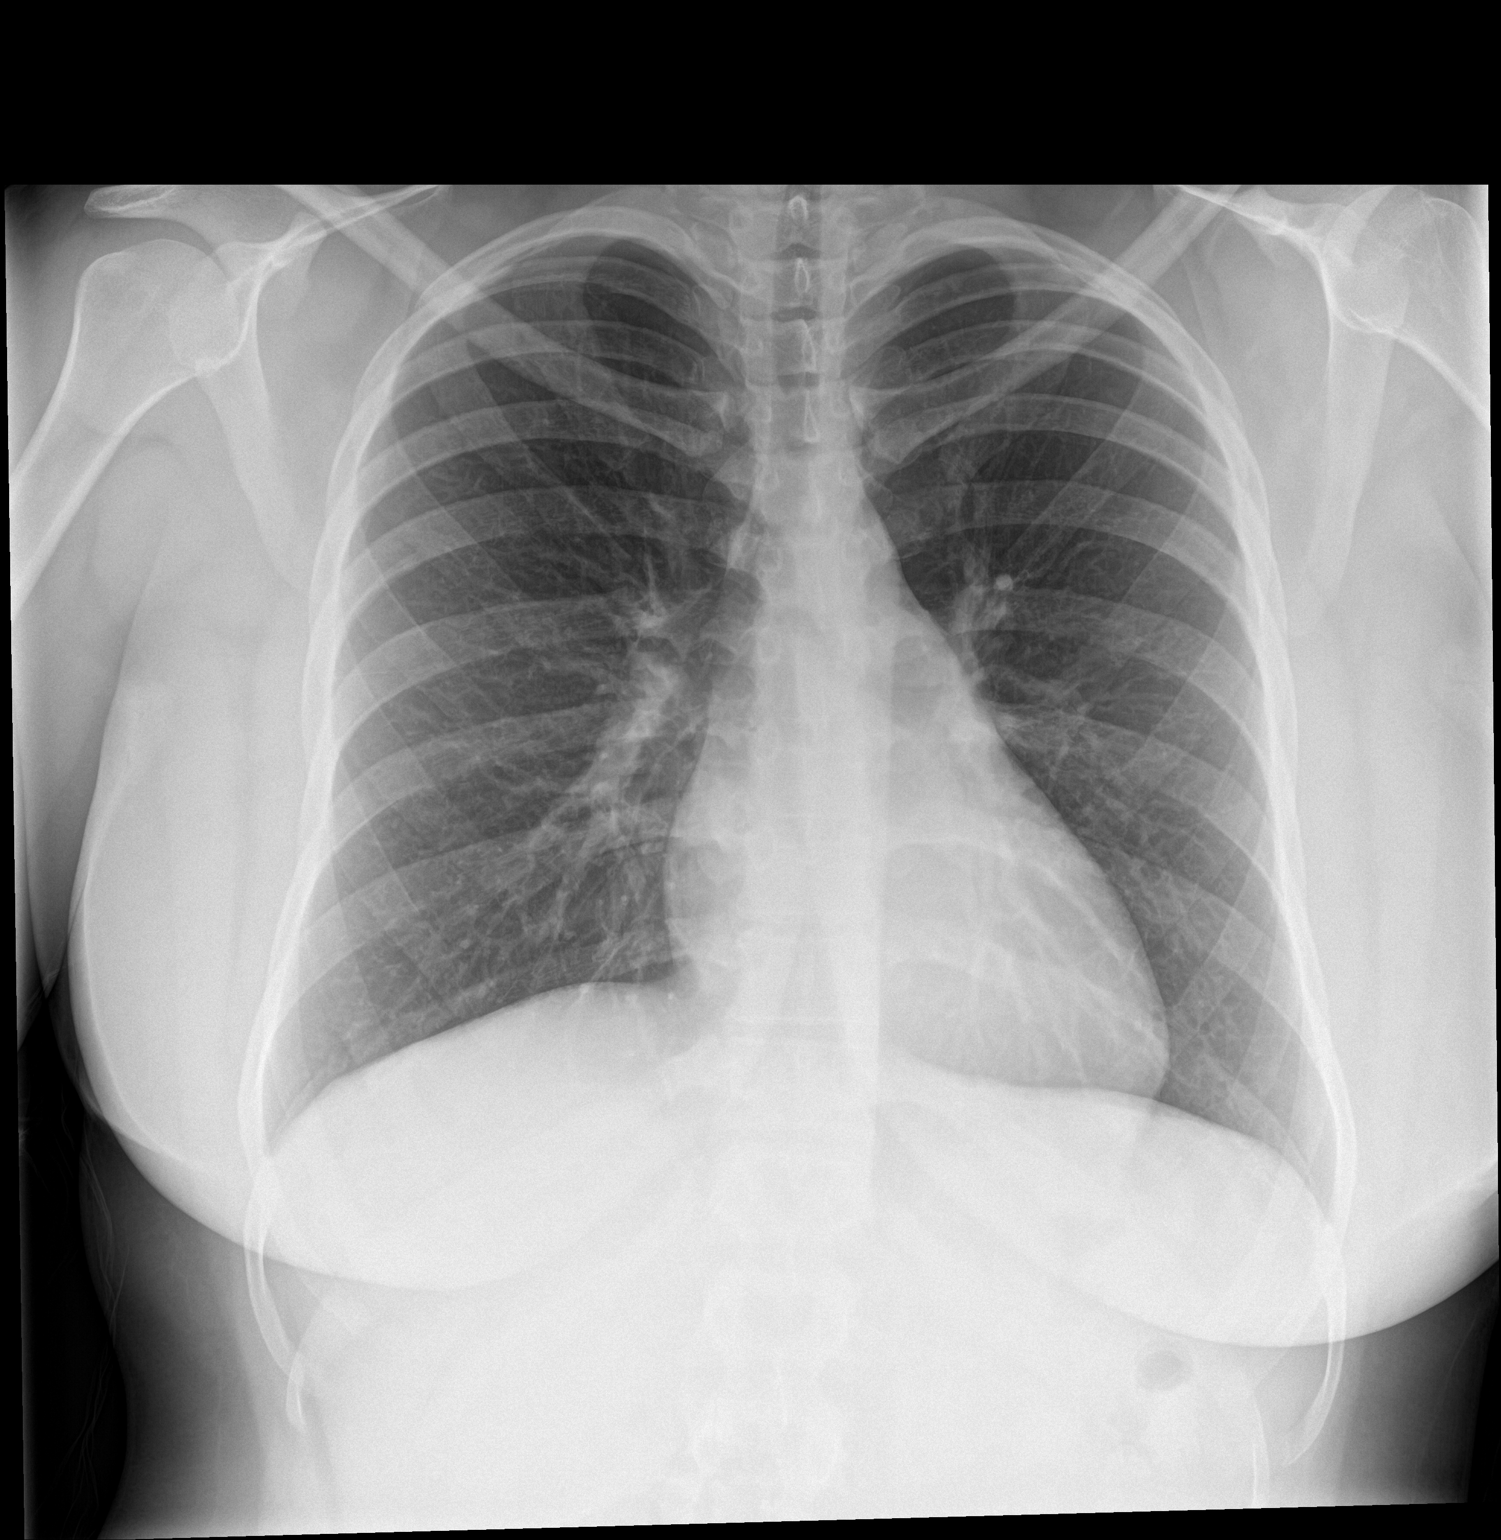

[chest lat]
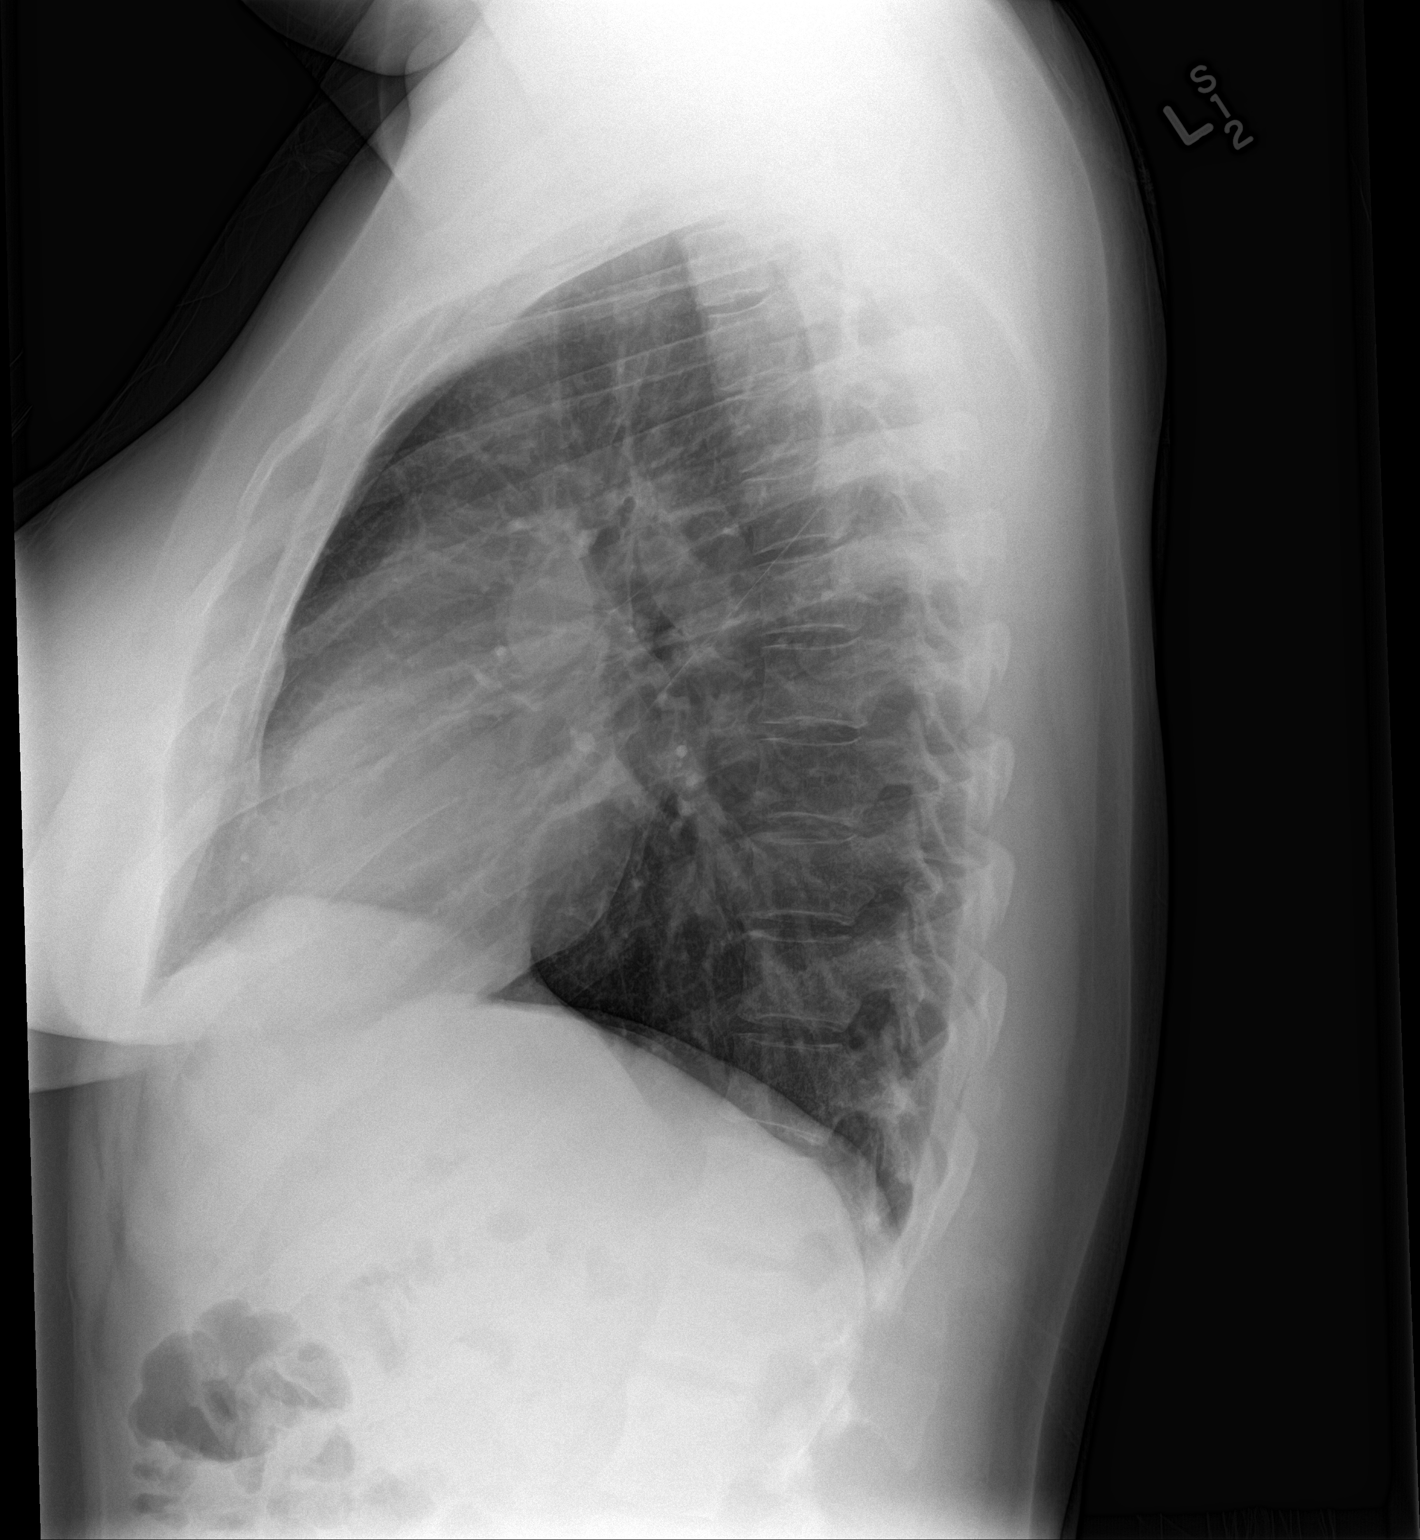

[2 of 2 positions shown; findings below may reference images not displayed]

FINDINGS: Lungs are well inflated without consolidation or effusion.
Cardiomediastinal silhouette, bones and soft tissues are within
normal.
IMPRESSION: No active cardiopulmonary disease.

## 2015-04-05 MED ORDER — ONDANSETRON 4 MG PO TBDP: 4.0000 mg | ORAL_TABLET | Freq: Three times a day (TID) | ORAL | Status: DC | PRN

## 2015-04-05 MED ORDER — ONDANSETRON HCL 4 MG/2ML IJ SOLN
4.0000 mg | Freq: Once | INTRAMUSCULAR | Status: AC
  Administered 2015-04-05: 4 mg via INTRAVENOUS
  Filled 2015-04-05: qty 2

## 2015-04-05 MED ORDER — SODIUM CHLORIDE 0.9 % IV BOLUS (SEPSIS)
1000.0000 mL | Freq: Once | INTRAVENOUS | Status: AC
Start: 2015-04-05 — End: 2015-04-05
  Administered 2015-04-05: 1000 mL via INTRAVENOUS

## 2015-04-05 MED ORDER — DIPHENHYDRAMINE HCL 50 MG/ML IJ SOLN
25.0000 mg | Freq: Once | INTRAMUSCULAR | Status: AC
  Administered 2015-04-05: 25 mg via INTRAVENOUS
  Filled 2015-04-05: qty 1

## 2015-04-05 MED ORDER — METOCLOPRAMIDE HCL 5 MG/ML IJ SOLN
10.0000 mg | Freq: Once | INTRAMUSCULAR | Status: AC
  Administered 2015-04-05: 10 mg via INTRAVENOUS
  Filled 2015-04-05: qty 2

## 2015-04-05 NOTE — ED Provider Notes (Signed)
CSN: [id number]     Arrival date & time 04/05/15  1109 History   First MD Initiated Contact with Patient 04/05/15 1157     Chief Complaint  Patient presents with  . Emesis     (Consider location/radiation/quality/duration/timing/severity/associated sxs/prior Treatment) Patient is a 30 y.o. female presenting with vomiting. The history is provided by the patient and medical records.  Emesis Associated symptoms: abdominal pain, diarrhea and myalgias     30 year old female with history of sleep apnea, asthma, PTSD, anxiety, depression, diverticular disease,  G6PD deficiency, sickle cell trait, presenting to the ED for  Nausea, vomiting, diarrhea, and generalized abdominal cramping which began yesterday.  She states she began having a generalized headache and dry cough this morning at 0600.  States her son has been sick with a cold as well, no GI symptoms.  She denies melena or hematochezia.  No chest pain or SOB.  States she feels absolutely awful and her "whole body hurts".  She did receive a flu vaccination here.   Prior abdominal surgeries include cesarean section 3 .  Vital signs stable. No intervention tried prior to arrival.  Past Medical History  Diagnosis Date  . Family history of anesthesia complication     son due sleep apnea  . Sleep apnea     awaiting a sleep study to be done at [place]  . Asthma   . Shortness of breath     with exertion or panic attack  . Bronchitis, acute   . PTSD (post-traumatic stress disorder)   . Anxiety   . Depression   . Diverticular disease   . Headache(784.0)     migraines  . Blood dyscrasia     sickle trait  . Anemia   . Sickle cell anemia (HCC) trait    patient claims asymptomatic   Past Surgical History  Procedure Laterality Date  . Eye surgery      x 10 as a child  . 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 Cesarean section  2010, 2012,2014    x 3  . Breast surgery Bilateral     reductions  . Tonsillectomy N/A 10/07/2013   Procedure: TONSILLECTOMY;  Surgeon: Christia Reading, MD;  [facility];  Service: ENT;  Laterality: N/A;   No family history on file. Social History  Substance Use Topics  . Smoking status: Former Smoker -- 0.25 packs/day for 1 years    Types: Cigarettes    Quit date: 07/02/2013  . Smokeless tobacco: Never Used  . Alcohol Use: Yes     Comment: social   OB History    No data available     Review of Systems  Respiratory: Positive for cough.   Gastrointestinal: Positive for nausea, vomiting, abdominal pain and diarrhea.  Musculoskeletal: Positive for myalgias.  All other systems reviewed and are negative.     Allergies  Other; Nsaids; and Sulfa antibiotics  Home Medications   Prior to Admission medications   Medication Sig Start Date End Date Taking? Authorizing Provider  albuterol (PROVENTIL HFA;VENTOLIN HFA) 108 (90 BASE) MCG/ACT inhaler Inhale 2 puffs into the lungs every 6 (six) hours as needed for wheezing or shortness of breath.   Yes Historical Provider, MD   BP 137/83 mmHg  Pulse 101  Temp(Src) 98.5 F (36.9 C) (Oral)  Resp 22  Ht  (1.676 m)  Wt 95.255 kg  BMI 33.91 kg/m2  SpO2 100%   Physical Exam  Constitutional: She is  oriented to person, place, and time. She appears well-developed and well-nourished. No distress.  HENT:  Head: Normocephalic and atraumatic.  Mouth/Throat: Uvula is midline, oropharynx is clear and moist and mucous membranes are normal. No oropharyngeal exudate, posterior oropharyngeal edema, posterior oropharyngeal erythema or tonsillar abscesses.   Mildly dry mucous membranes  Eyes: Conjunctivae and EOM are normal. Pupils are equal, round, and reactive to light.  Neck: Normal range of motion. Neck supple.  Cardiovascular: Normal rate, regular rhythm and normal heart sounds.   Pulmonary/Chest: Effort normal and breath sounds normal. No respiratory distress. She has no wheezes. She has no rhonchi. She has no rales.  Abdominal: Soft.  Bowel sounds are normal. There is no tenderness. There is no guarding and no CVA tenderness.  Musculoskeletal: Normal range of motion.  Neurological: She is alert and oriented to person, place, and time.  Skin: Skin is warm and dry. She is not diaphoretic.  Psychiatric: She has a normal mood and affect.  Nursing note and vitals reviewed.   ED Course  Procedures (including critical care time) Labs Review Labs Reviewed  COMPREHENSIVE METABOLIC PANEL - Abnormal; Notable for the following:    Glucose, Bld 119 (*)    All other components within normal limits  CBC WITH DIFFERENTIAL/PLATELET  LIPASE, BLOOD  URINALYSIS, ROUTINE W REFLEX MICROSCOPIC (NOT AT [facility])  I-STAT BETA HCG BLOOD, ED ([facility], [facility], [facility] ONLY)    Imaging Review Dg Chest 2 View  04/05/2015  CLINICAL DATA:  Productive cough with low-grade fever since last night. Shortness of breath with nausea and vomiting. EXAM: CHEST  2 VIEW COMPARISON:  03/06/2014 FINDINGS: Lungs are well inflated without consolidation or effusion. Cardiomediastinal silhouette, bones and soft tissues are within normal. IMPRESSION: No active cardiopulmonary disease. Electronically Signed   By: Elberta Fortis M.D.   On: 04/05/2015 13:57   I have personally reviewed and evaluated these images and lab results as part of my medical decision-making.   EKG Interpretation None      MDM   Final diagnoses:  Nausea vomiting and diarrhea  Cough  Body aches    30 year old female here with nausea, vomiting, diarrhea, headache, and cough. Patient's 31-year-old son is currently being seen for similar symptoms. Patient is afebrile, nontoxic. She is in no acute distress. Her abdominal exam is benign. She does have mildly dry mucous membranes. Exam is overall noninfectious. Given her history of G6PD deficiency , labs were obtained which are overall reassuring-- no leukocytosis, anemia, evidence of AKI or significant dehydration.  U/a without noted blood.  CXR is clear.   Remains without chest pain or SOB.  Suspect this is likely a viral process.  Patient was treated with IVF, reglan, benadaryl, and zofran with improvement of symptoms.  She is tolerating oral fluids well.  She has not had any active vomiting or diarrhea here in the ED.  Will d/c home with supportive care.  Discussed plan with patient, he/she acknowledged understanding and agreed with plan of care.  Return precautions given for new or worsening symptoms.  Garlon Hatchet, PA-C 04/05/15 1517  Laurence Spates, MD 04/06/15 [phone]

## 2015-04-05 NOTE — Discharge Instructions (Signed)
Take the prescribed medication as directed.  You may  Use imodium if needed for any further diarrhea. Recommend gentle diet to start, progress back to normal as tolerated.  Make sure to drink fluids to stay hydrated. Follow-up with your primary care doctor. Return to the ED for new or worsening symptoms.

## 2015-04-05 NOTE — ED Notes (Addendum)
Pt reports N/V/D headache and cough since 6am.

## 2015-04-10 ENCOUNTER — Encounter (HOSPITAL_COMMUNITY): Payer: Self-pay | Admitting: Emergency Medicine

## 2015-04-10 ENCOUNTER — Emergency Department (HOSPITAL_COMMUNITY): Payer: Medicaid Other

## 2015-04-10 ENCOUNTER — Emergency Department (HOSPITAL_COMMUNITY)
Admission: EM | Admit: 2015-04-10 | Discharge: 2015-04-10 | Disposition: A | Discharge status: Home / Self Care | Payer: Medicaid Other | Attending: Emergency Medicine | Admitting: Emergency Medicine

## 2015-04-10 DIAGNOSIS — Z862 Personal history of diseases of the blood and blood-forming organs and certain disorders involving the immune mechanism: Secondary | ICD-10-CM | POA: Diagnosis not present

## 2015-04-10 DIAGNOSIS — F419 Anxiety disorder, unspecified: Secondary | ICD-10-CM | POA: Diagnosis not present

## 2015-04-10 DIAGNOSIS — J45901 Unspecified asthma with (acute) exacerbation: Secondary | ICD-10-CM | POA: Insufficient documentation

## 2015-04-10 DIAGNOSIS — Z79899 Other long term (current) drug therapy: Secondary | ICD-10-CM | POA: Insufficient documentation

## 2015-04-10 DIAGNOSIS — F329 Major depressive disorder, single episode, unspecified: Secondary | ICD-10-CM | POA: Insufficient documentation

## 2015-04-10 DIAGNOSIS — Z8669 Personal history of other diseases of the nervous system and sense organs: Secondary | ICD-10-CM | POA: Diagnosis not present

## 2015-04-10 DIAGNOSIS — Z87891 Personal history of nicotine dependence: Secondary | ICD-10-CM | POA: Diagnosis not present

## 2015-04-10 DIAGNOSIS — R05 Cough: Secondary | ICD-10-CM | POA: Diagnosis present

## 2015-04-10 DIAGNOSIS — J40 Bronchitis, not specified as acute or chronic: Secondary | ICD-10-CM

## 2015-04-10 IMAGING — CR DG CHEST 2V
2 series · 2 of 2 positions shown · non-contrast
Comparison: [DATE]

CLINICAL DATA: Cough and shortness of breath for 2 days, asthma,
former smoker, sickle cell disease

EXAM:
CHEST  2 VIEW

[chest pa]
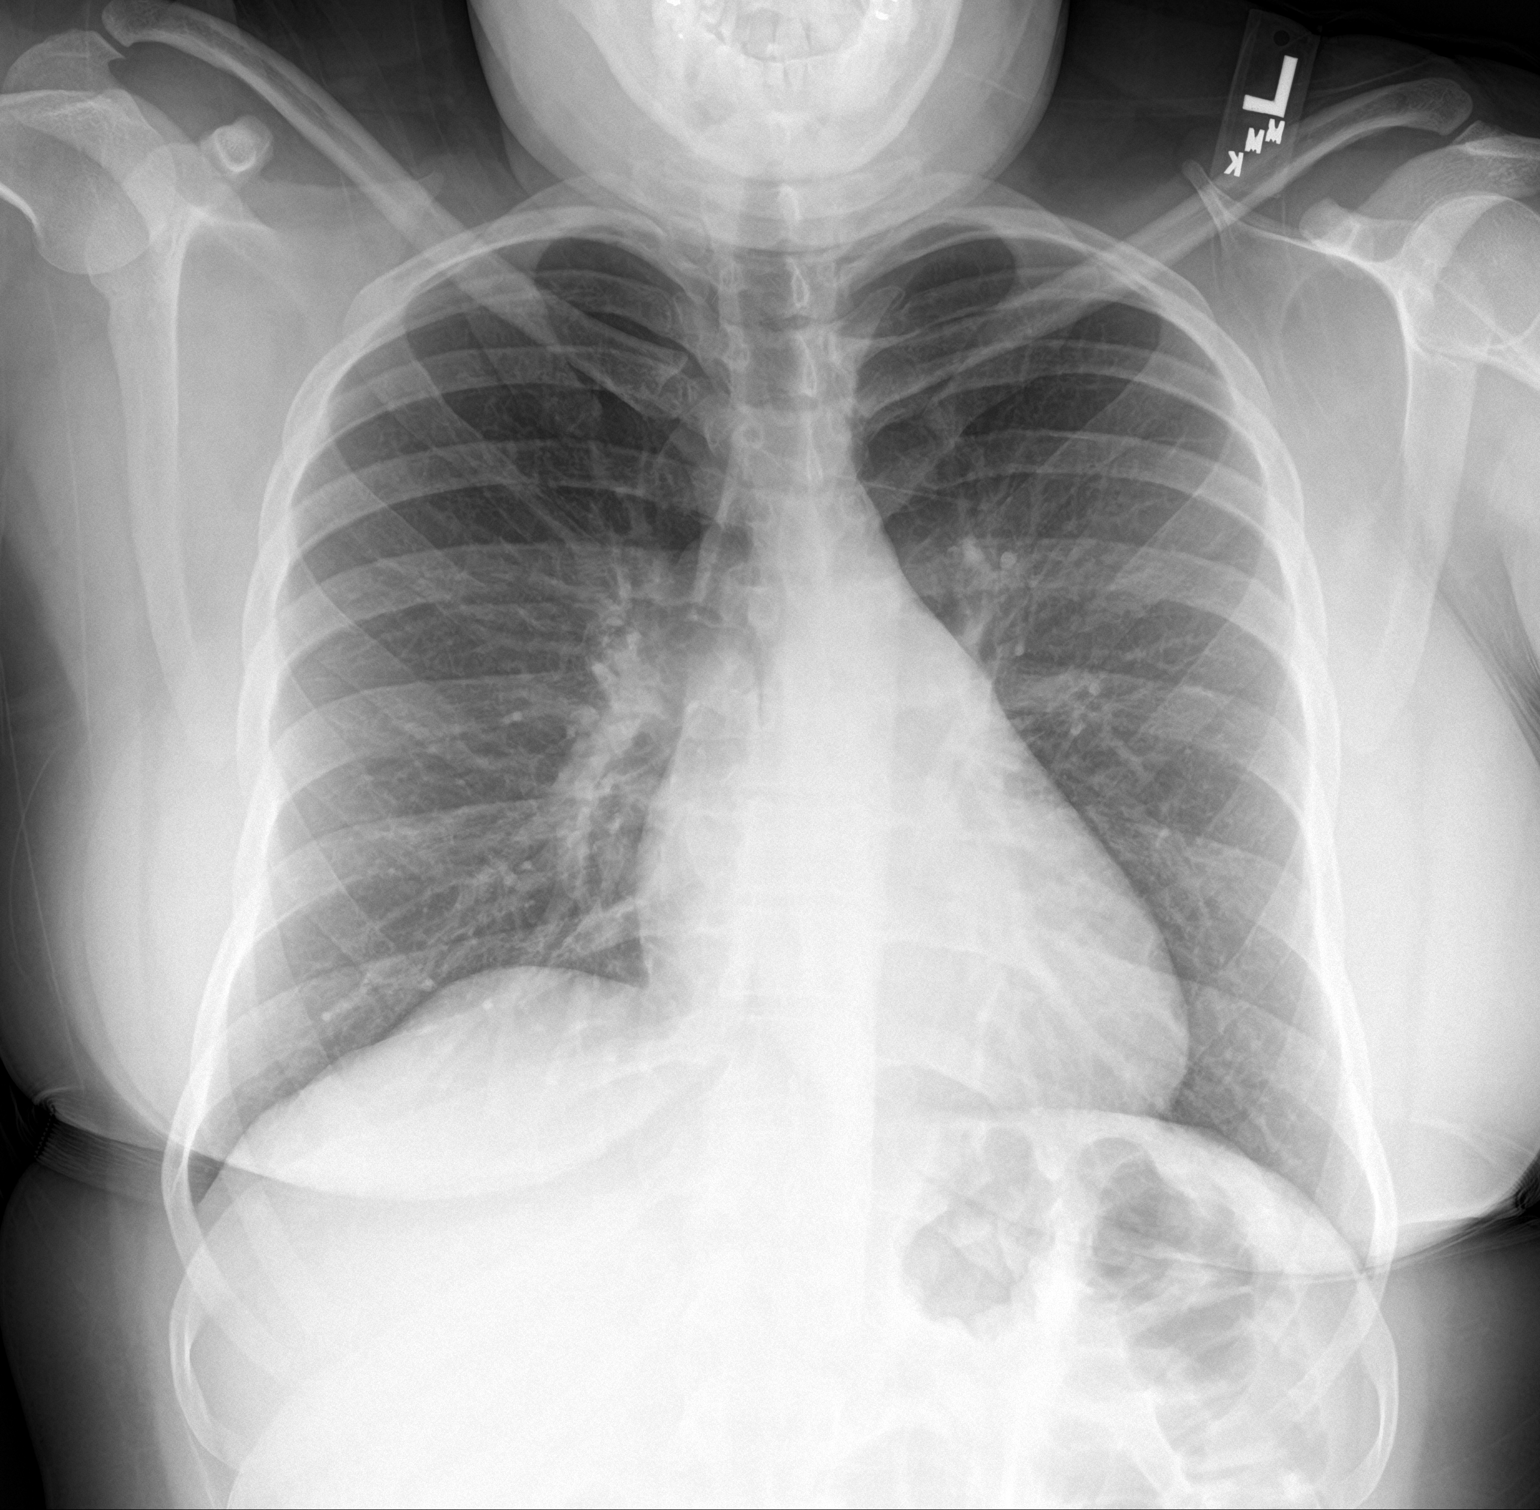

[chest lat]
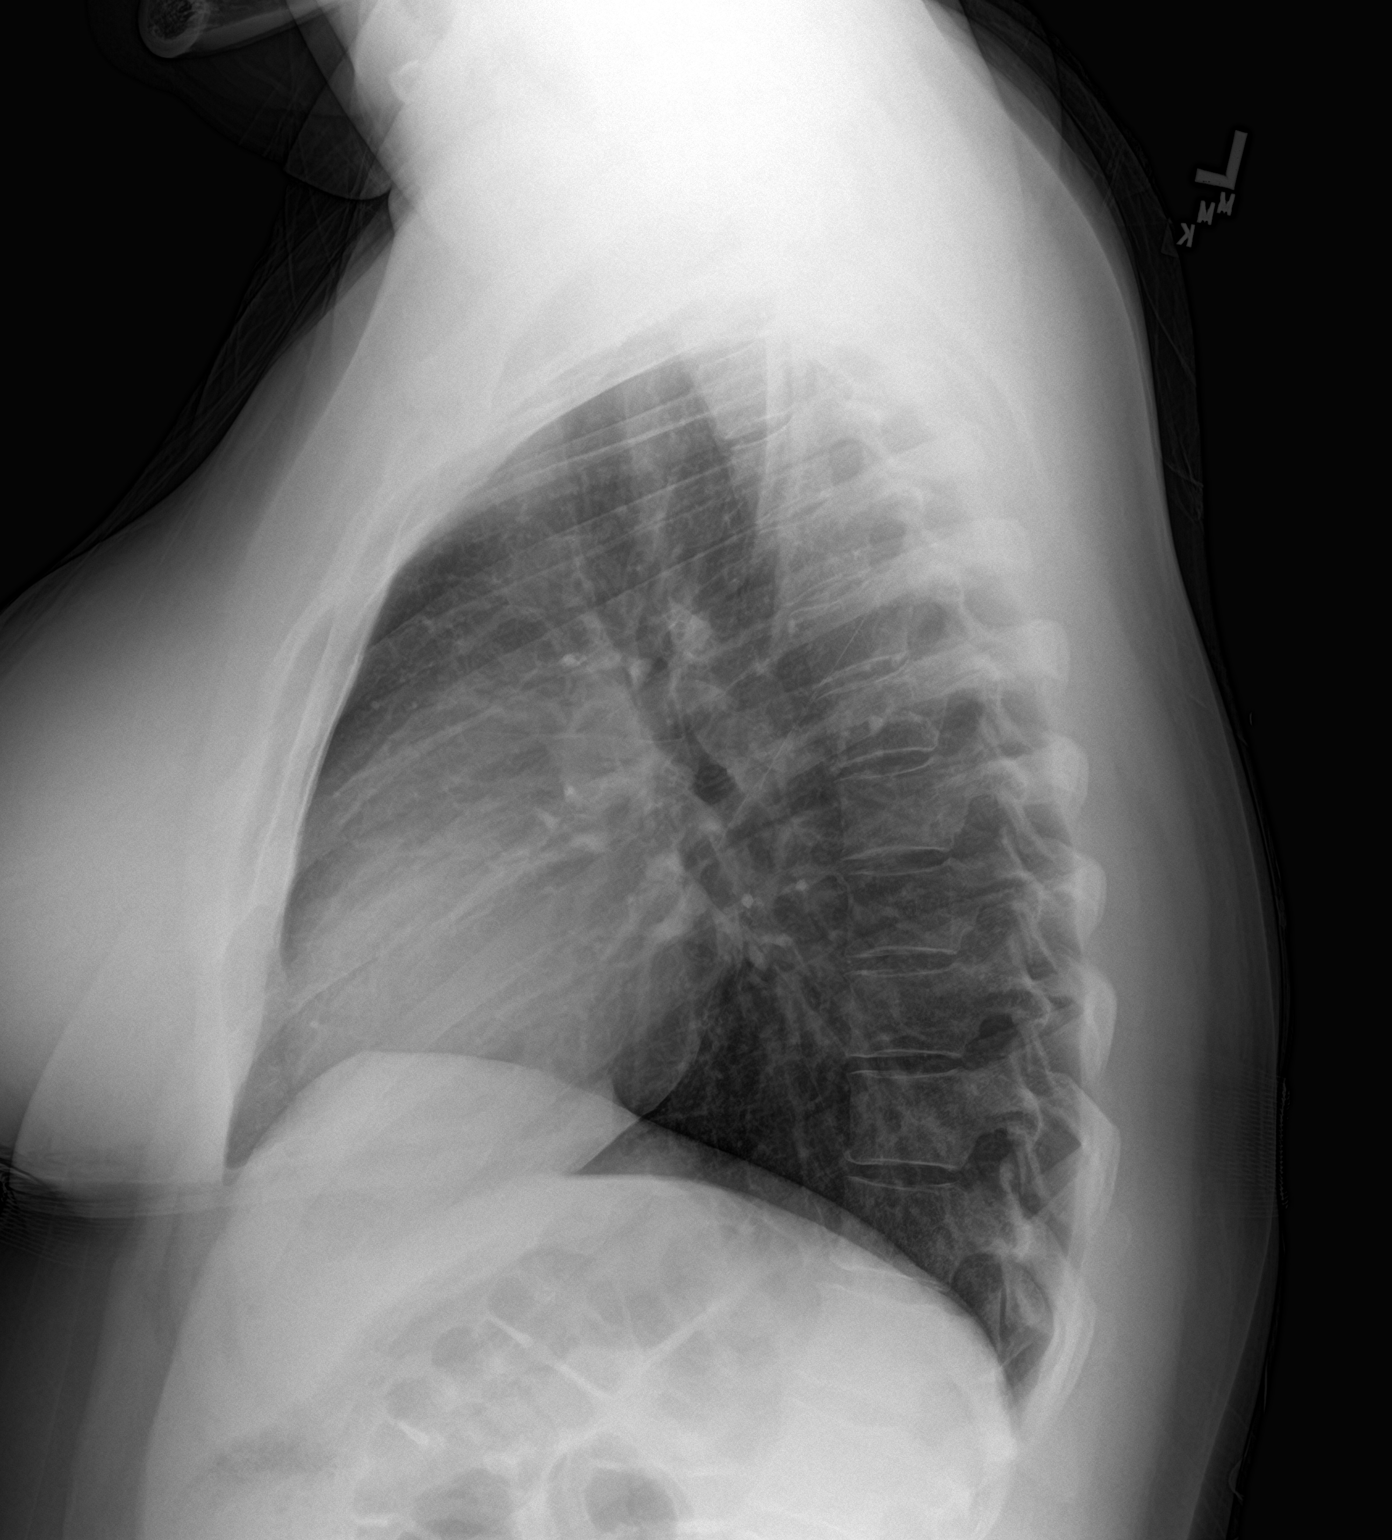

[2 of 2 positions shown; findings below may reference images not displayed]

FINDINGS: Borderline enlargement of cardiac silhouette.

Mediastinal contours and pulmonary vascularity normal.

Lungs clear.

No pleural effusion or pneumothorax.

No acute osseous lesions.
IMPRESSION: No acute abnormalities.

## 2015-04-10 MED ORDER — HYDROCOD POLST-CPM POLST ER 10-8 MG/5ML PO SUER
5.0000 mL | Freq: Every evening | ORAL | Status: DC | PRN
Start: 2015-04-10 — End: 2016-10-24

## 2015-04-10 MED ORDER — LIDOCAINE VISCOUS 2 % MT SOLN: 20.0000 mL | OROMUCOSAL | Status: DC | PRN

## 2015-04-10 MED ORDER — ACETAMINOPHEN 500 MG PO TABS: 500.0000 mg | ORAL_TABLET | Freq: Four times a day (QID) | ORAL | Status: DC | PRN

## 2015-04-10 MED ORDER — BENZONATATE 100 MG PO CAPS
100.0000 mg | ORAL_CAPSULE | Freq: Once | ORAL | Status: AC
  Administered 2015-04-10: 100 mg via ORAL
  Filled 2015-04-10: qty 1

## 2015-04-10 MED ORDER — ALBUTEROL SULFATE HFA 108 (90 BASE) MCG/ACT IN AERS: 2.0000 | INHALATION_SPRAY | RESPIRATORY_TRACT | Status: DC | PRN

## 2015-04-10 MED ORDER — ACETAMINOPHEN 325 MG PO TABS
650.0000 mg | ORAL_TABLET | Freq: Once | ORAL | Status: AC
  Administered 2015-04-10: 650 mg via ORAL
  Filled 2015-04-10: qty 2

## 2015-04-10 MED ORDER — LIDOCAINE HCL (PF) 1 % IJ SOLN: 5.0000 mL | Freq: Once | INTRAMUSCULAR | Status: DC

## 2015-04-10 MED ORDER — LIDOCAINE VISCOUS 2 % MT SOLN
15.0000 mL | Freq: Once | OROMUCOSAL | Status: AC
  Administered 2015-04-10: 15 mL via OROMUCOSAL

## 2015-04-10 NOTE — ED Notes (Signed)
Pt sts cough and congestion x 3 days 

## 2015-04-10 NOTE — ED Notes (Signed)
Pt in peds waiting with son

## 2015-04-10 NOTE — Discharge Instructions (Signed)
Upper Respiratory Infection, Adult Most upper respiratory infections (URIs) are a viral infection of the air passages leading to the lungs. A URI affects the nose, throat, and upper air passages. The most common type of URI is nasopharyngitis and is typically referred to as "the common cold." URIs run their course and usually go away on their own. Most of the time, a URI does not require medical attention, but sometimes a bacterial infection in the upper airways can follow a viral infection. This is called a secondary infection. Sinus and middle ear infections are common types of secondary upper respiratory infections. Bacterial pneumonia can also complicate a URI. A URI can worsen asthma and chronic obstructive pulmonary disease (COPD). Sometimes, these complications can require emergency medical care and may be life threatening.  CAUSES Almost all URIs are caused by viruses. A virus is a type of germ and can spread from one person to another.  RISKS FACTORS You may be at risk for a URI if:   You smoke.   You have chronic heart or lung disease.  You have a weakened defense (immune) system.   You are very young or very old.   You have nasal allergies or asthma.  You work in crowded or poorly ventilated areas.  You work in health care facilities or schools. SIGNS AND SYMPTOMS  Symptoms typically develop 2-3 days after you come in contact with a cold virus. Most viral URIs last 7-10 days. However, viral URIs from the influenza virus (flu virus) can last 14-18 days and are typically more severe. Symptoms may include:   Runny or stuffy (congested) nose.   Sneezing.   Cough.   Sore throat.   Headache.   Fatigue.   Fever.   Loss of appetite.   Pain in your forehead, behind your eyes, and over your cheekbones (sinus pain).  Muscle aches.  DIAGNOSIS  Your health care provider may diagnose a URI by:  Physical exam.  Tests to check that your symptoms are not due to  another condition such as:  Strep throat.  Sinusitis.  Pneumonia.  Asthma. TREATMENT  A URI goes away on its own with time. It cannot be cured with medicines, but medicines may be prescribed or recommended to relieve symptoms. Medicines may help:  Reduce your fever.  Reduce your cough.  Relieve nasal congestion. HOME CARE INSTRUCTIONS   Take medicines only as directed by your health care provider.   Gargle warm saltwater or take cough drops to comfort your throat as directed by your health care provider.  Use a warm mist humidifier or inhale steam from a shower to increase air moisture. This may make it easier to breathe.  Drink enough fluid to keep your urine clear or pale yellow.   Eat soups and other clear broths and maintain good nutrition.   Rest as needed.   Return to work when your temperature has returned to normal or as your health care provider advises. You may need to stay home longer to avoid infecting others. You can also use a face mask and careful hand washing to prevent spread of the virus.  Increase the usage of your inhaler if you have asthma.   Do not use any tobacco products, including cigarettes, chewing tobacco, or electronic cigarettes. If you need help quitting, ask your health care provider. PREVENTION  The best way to protect yourself from getting a cold is to practice good hygiene.   Avoid oral or hand contact with people with cold   symptoms.   Wash your hands often if contact occurs.  There is no clear evidence that vitamin C, vitamin E, echinacea, or exercise reduces the chance of developing a cold. However, it is always recommended to get plenty of rest, exercise, and practice good nutrition.  SEEK MEDICAL CARE IF:   You are getting worse rather than better.   Your symptoms are not controlled by medicine.   You have chills.  You have worsening shortness of breath.  You have brown or red mucus.  You have yellow or brown nasal  discharge.  You have pain in your face, especially when you bend forward.  You have a fever.  You have swollen neck glands.  You have pain while swallowing.  You have white areas in the back of your throat. SEEK IMMEDIATE MEDICAL CARE IF:   You have severe or persistent:  Headache.  Ear pain.  Sinus pain.  Chest pain.  You have chronic lung disease and any of the following:  Wheezing.  Prolonged cough.  Coughing up blood.  A change in your usual mucus.  You have a stiff neck.  You have changes in your:  Vision.  Hearing.  Thinking.  Mood. MAKE SURE YOU:   Understand these instructions.  Will watch your condition.  Will get help right away if you are not doing well or get worse.   This information is not intended to replace advice given to you by your health care provider. Make sure you discuss any questions you have with your health care provider.   Document Released: 07/27/2000 Document Revised: 06/17/2014 Document Reviewed: 05/08/2013 Elsevier Interactive Patient Education 2016 [employer]  

## 2015-04-10 NOTE — ED Provider Notes (Signed)
CSN: [id number]     Arrival date & time 04/10/15  1020 History  By signing my name below, I, Essence Howell, attest that this documentation has been prepared under the direction and in the presence of Cheri Fowler, PA-C Electronically Signed: Charline Bills, ED Scribe 04/10/2015 at 12:34 PM.   Chief Complaint  Patient presents with  . Cough   The history is provided by the patient. No language interpreter was used.   HPI Comments: Leah Shea is a 30 y.o. female, with a h/o asthma, who presents to the Emergency Department complaining of dry cough onset yesterday morning, worsened since last night. Pt reports associated sore throat, mild chest tightness, mild wheezing, mild ear pain. Pain is worsened with lying down. She has tried her albuterol inhaler, Robitussin Dm, Tylenol and tramadol with mild significant relief. She denies fever, rhinorrhea, nausea, vomiting, CP. Pt is a nonsmoker.   Past Medical History  Diagnosis Date  . Family history of anesthesia complication     son due sleep apnea  . Sleep apnea     awaiting a sleep study to be done at [place]  . Asthma   . Shortness of breath     with exertion or panic attack  . Bronchitis, acute   . PTSD (post-traumatic stress disorder)   . Anxiety   . Depression   . Diverticular disease   . Headache(784.0)     migraines  . Blood dyscrasia     sickle trait  . Anemia   . Sickle cell anemia (HCC) trait    patient claims asymptomatic   Past Surgical History  Procedure Laterality Date  . Eye surgery      x 10 as a child  . 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 Cesarean section  2010, 2012,2014    x 3  . Breast surgery Bilateral     reductions  . Tonsillectomy N/A 10/07/2013    Procedure: TONSILLECTOMY;  Surgeon: Christia Reading, MD;  Location: [facility] OR;  Service: ENT;  Laterality: N/A;   History reviewed. No pertinent family history. Social History  Substance Use Topics  . Smoking status: Former Smoker -- 0.25 packs/day for 1  years    Types: Cigarettes    Quit date: 07/02/2013  . Smokeless tobacco: Never Used  . Alcohol Use: Yes     Comment: social   OB History    No data available     Review of Systems  Constitutional: Negative for fever.  HENT: Positive for ear pain and sore throat. Negative for rhinorrhea.   Respiratory: Positive for cough, chest tightness and wheezing.   Gastrointestinal: Negative for nausea and vomiting.  All other systems reviewed and are negative.  Allergies  Other; Nsaids; and Sulfa antibiotics  Home Medications   Prior to Admission medications   Medication Sig Start Date End Date Taking? Authorizing Provider  acetaminophen (TYLENOL) 500 MG tablet Take 1 tablet (500 mg total) by mouth every 6 (six) hours as needed. 04/10/15   Cheri Fowler, PA-C  albuterol (PROVENTIL HFA;VENTOLIN HFA) 108 (90 Base) MCG/ACT inhaler Inhale 2 puffs into the lungs every 4 (four) hours as needed for wheezing or shortness of breath. 04/10/15   Cheri Fowler, PA-C  chlorpheniramine-HYDROcodone (TUSSIONEX PENNKINETIC ER) 10-8 MG/5ML SUER Take 5 mLs by mouth at bedtime as needed for cough. 04/10/15   Cheri Fowler, PA-C  lidocaine (XYLOCAINE) 2 % solution Use as directed 20 mLs in the mouth or throat as needed  for mouth pain. 04/10/15   Jacobb Alen, PA-C  ondansetron (ZOFRAN ODT) 4 MG disintegrating tablet Take 1 tablet (4 mg total) by mouth every 8 (eight) hours as needed for nausea. 04/05/15   Garlon Hatchet, PA-C   BP 145/84 mmHg  Pulse 81  Temp(Src) 98.1 F (36.7 C) (Oral)  Resp 18  SpO2 100% Physical Exam  Constitutional: She is oriented to person, place, and time. She appears well-developed and well-nourished.  Non-toxic appearance. She does not have a sickly appearance. She does not appear ill.  HENT:  Head: Normocephalic and atraumatic.  Right Ear: Tympanic membrane normal.  Left Ear: Tympanic membrane normal.  Nose: Nose normal.  Mouth/Throat: Uvula is midline, oropharynx is clear and moist and mucous  membranes are normal. No oropharyngeal exudate, posterior oropharyngeal edema, posterior oropharyngeal erythema or tonsillar abscesses.  Eyes: Conjunctivae are normal. Pupils are equal, round, and reactive to light.  Neck: Normal range of motion. Neck supple.  Cardiovascular: Normal rate, regular rhythm and normal heart sounds.   No murmur heard. Pulmonary/Chest: Effort normal and breath sounds normal. No accessory muscle usage or stridor. No respiratory distress. She has no wheezes. She has no rhonchi. She has no rales.  Abdominal: Soft. Bowel sounds are normal. She exhibits no distension. There is no tenderness.  Musculoskeletal: Normal range of motion.  Lymphadenopathy:    She has no cervical adenopathy.  Neurological: She is alert and oriented to person, place, and time.  Speech clear without dysarthria.  Skin: Skin is warm and dry.  Psychiatric: She has a normal mood and affect. Her behavior is normal.   ED Course  Procedures (including critical care time) DIAGNOSTIC STUDIES: Oxygen Saturation is 100% on RA, normal by my interpretation.    COORDINATION OF CARE: 11:47 AM-Discussed treatment plan which includes CXR, Tylenol and Tessalon with pt at bedside and pt agreed to plan.   Labs Review Labs Reviewed - No data to display  Imaging Review Dg Chest 2 View  04/10/2015  CLINICAL DATA:  Cough and shortness of breath for 2 days, asthma, former smoker, sickle cell disease EXAM: CHEST  2 VIEW COMPARISON:  04/05/2015 FINDINGS: Borderline enlargement of cardiac silhouette. Mediastinal contours and pulmonary vascularity normal. Lungs clear. No pleural effusion or pneumothorax. No acute osseous lesions. IMPRESSION: No acute abnormalities. Electronically Signed   By: Ulyses Southward M.D.   On: 04/10/2015 12:29   I have personally reviewed and evaluated these images and lab results as part of my medical decision-making.   EKG Interpretation None      MDM   Final diagnoses:  Bronchitis    Likely viral etiology.  VSS, NAD.  Lungs CTAB.  CXR negative.  Plan to discharge home with viscous lidocaine, tussionex, tylenol, and albuterol.  Follow up PCP.  Discussed return precautions.  Patient agrees and acknowledges the above plan for discharge.   I personally performed the services described in this documentation, which was scribed in my presence. The recorded information has been reviewed and is accurate.    Cheri Fowler, PA-C 04/10/15 1304  Pricilla Loveless, MD 04/11/15 1101

## 2015-06-11 ENCOUNTER — Emergency Department (HOSPITAL_COMMUNITY): Payer: Medicaid Other

## 2015-06-11 ENCOUNTER — Encounter (HOSPITAL_COMMUNITY): Payer: Self-pay | Admitting: Emergency Medicine

## 2015-06-11 ENCOUNTER — Other Ambulatory Visit: Payer: Self-pay

## 2015-06-11 DIAGNOSIS — R21 Rash and other nonspecific skin eruption: Secondary | ICD-10-CM | POA: Insufficient documentation

## 2015-06-11 DIAGNOSIS — M545 Low back pain: Secondary | ICD-10-CM | POA: Diagnosis not present

## 2015-06-11 DIAGNOSIS — R079 Chest pain, unspecified: Secondary | ICD-10-CM | POA: Insufficient documentation

## 2015-06-11 DIAGNOSIS — J45909 Unspecified asthma, uncomplicated: Secondary | ICD-10-CM | POA: Diagnosis not present

## 2015-06-11 DIAGNOSIS — F419 Anxiety disorder, unspecified: Secondary | ICD-10-CM | POA: Insufficient documentation

## 2015-06-11 DIAGNOSIS — M25552 Pain in left hip: Secondary | ICD-10-CM | POA: Insufficient documentation

## 2015-06-11 LAB — CBC
HCT: 35.1 % — ABNORMAL LOW (ref 36.0–46.0)
Hemoglobin: 12 g/dL (ref 12.0–15.0)
MCH: 28.4 pg (ref 26.0–34.0)
MCHC: 34.2 g/dL (ref 30.0–36.0)
MCV: 83 fL (ref 78.0–100.0)
PLATELETS: 276 10*3/uL (ref 150–400)
RBC: 4.23 MIL/uL (ref 3.87–5.11)
RDW: 13.4 % (ref 11.5–15.5)
WBC: 9.1 10*3/uL (ref 4.0–10.5)

## 2015-06-11 LAB — BASIC METABOLIC PANEL
Anion gap: 11 (ref 5–15)
BUN: 15 mg/dL (ref 6–20)
CHLORIDE: 108 mmol/L (ref 101–111)
CO2: 20 mmol/L — AB (ref 22–32)
CREATININE: 1.13 mg/dL — AB (ref 0.44–1.00)
Calcium: 9.7 mg/dL (ref 8.9–10.3)
GFR calc non Af Amer: >60 mL/min (ref >60)
Glucose, Bld: 100 mg/dL — ABNORMAL HIGH (ref 65–99)
Potassium: 3.9 mmol/L (ref 3.5–5.1)
Sodium: 139 mmol/L (ref 135–145)

## 2015-06-11 LAB — URINALYSIS, ROUTINE W REFLEX MICROSCOPIC
BILIRUBIN URINE: NEGATIVE
GLUCOSE, UA: NEGATIVE mg/dL
HGB URINE DIPSTICK: NEGATIVE
Ketones, ur: NEGATIVE mg/dL
NITRITE: NEGATIVE
PH: 7.5 (ref 5.0–8.0)
Protein, ur: NEGATIVE mg/dL
SPECIFIC GRAVITY, URINE: 1.011 (ref 1.005–1.030)

## 2015-06-11 LAB — URINE MICROSCOPIC-ADD ON: RBC / HPF: NONE SEEN RBC/hpf (ref 0–5)

## 2015-06-11 LAB — I-STAT TROPONIN, ED: Troponin i, poc: 0 ng/mL (ref 0.00–0.08)

## 2015-06-11 LAB — POC URINE PREG, ED: Preg Test, Ur: NEGATIVE

## 2015-06-11 IMAGING — DX DG CHEST 2V
2 series · 2 of 2 positions shown · non-contrast
Comparison: Chest radiograph [DATE]

CLINICAL DATA: Patient with centralized chest pain and shortness of
breath radiating to the left rib cage, worse with deep inspiration.

EXAM:
CHEST  2 VIEW

[chest pa]
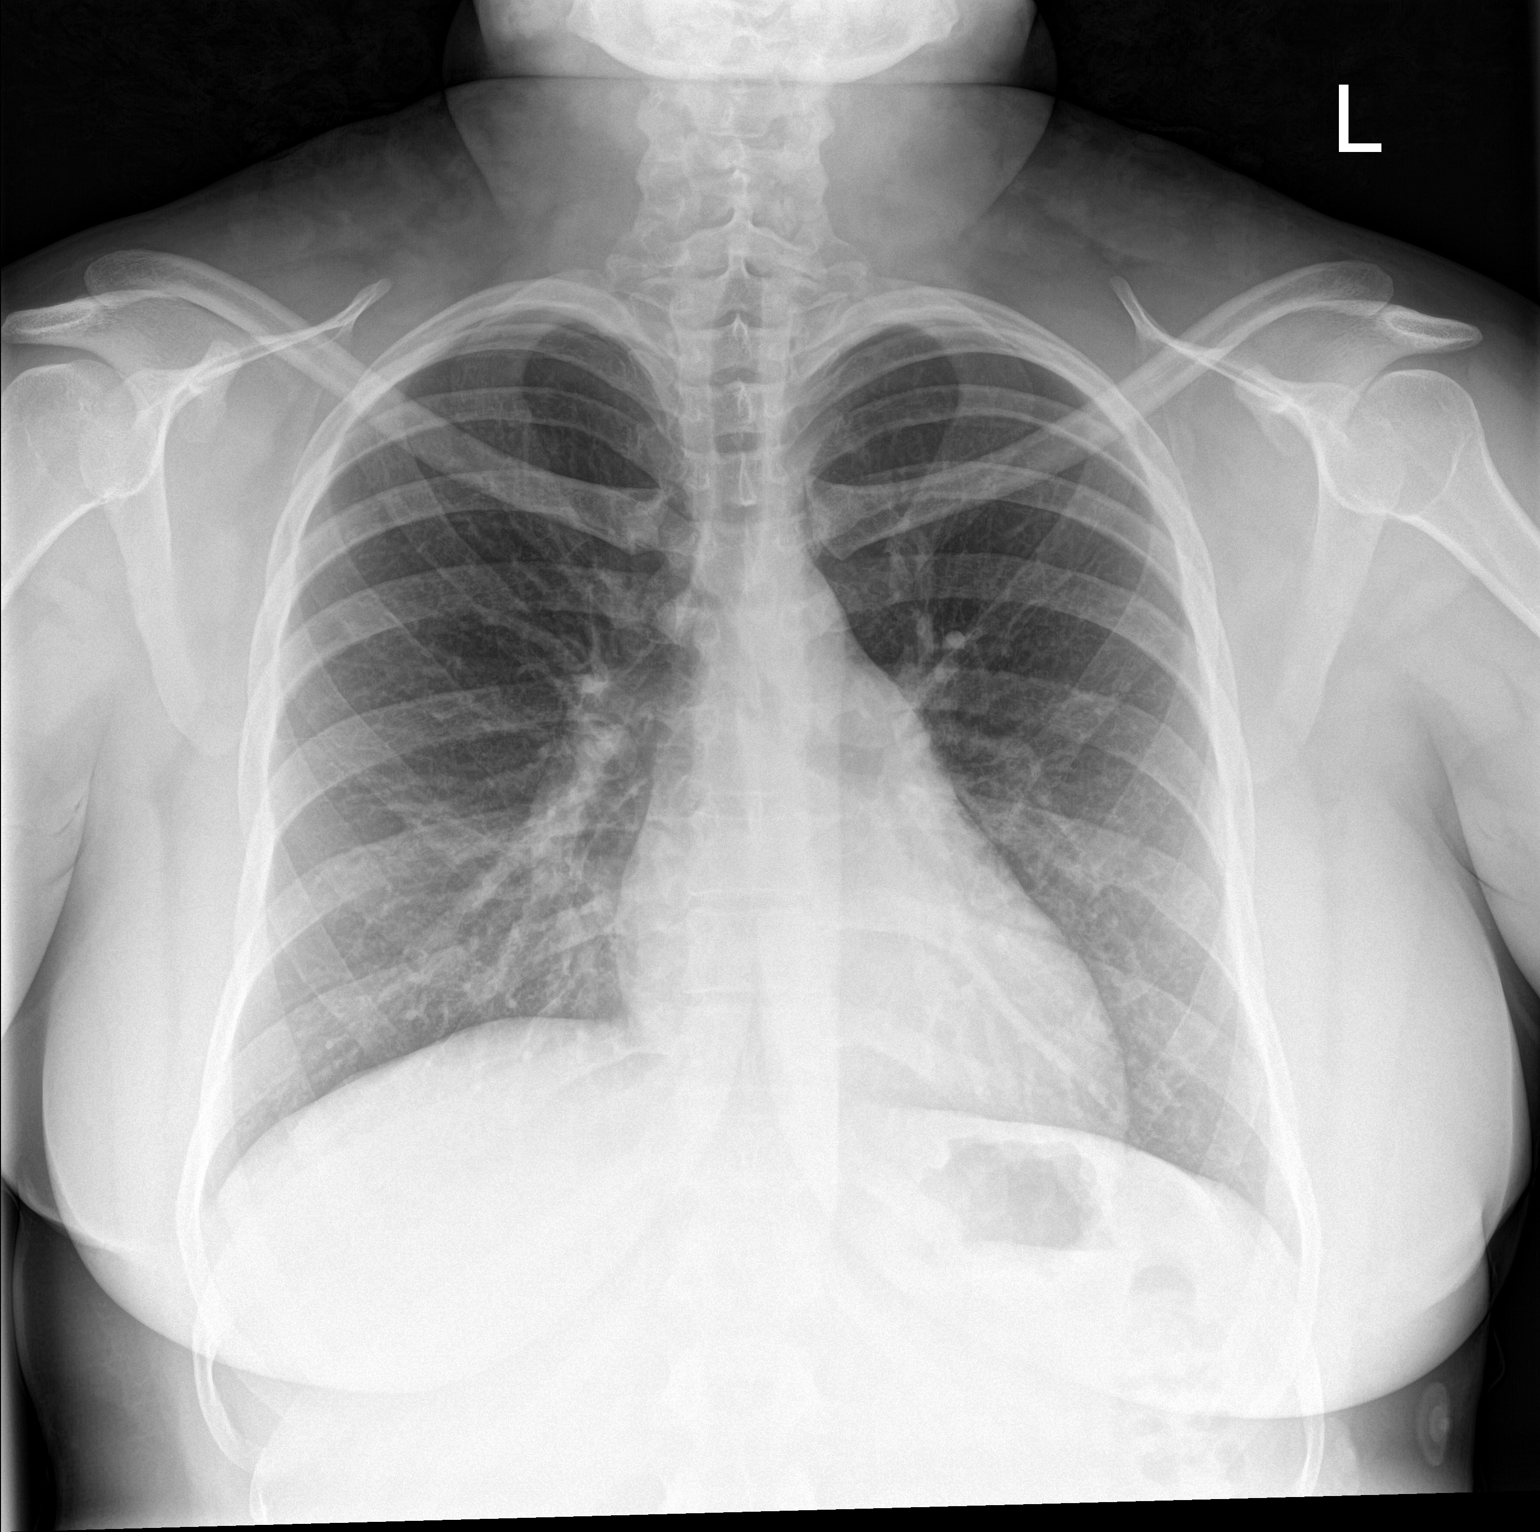

[chest lat]
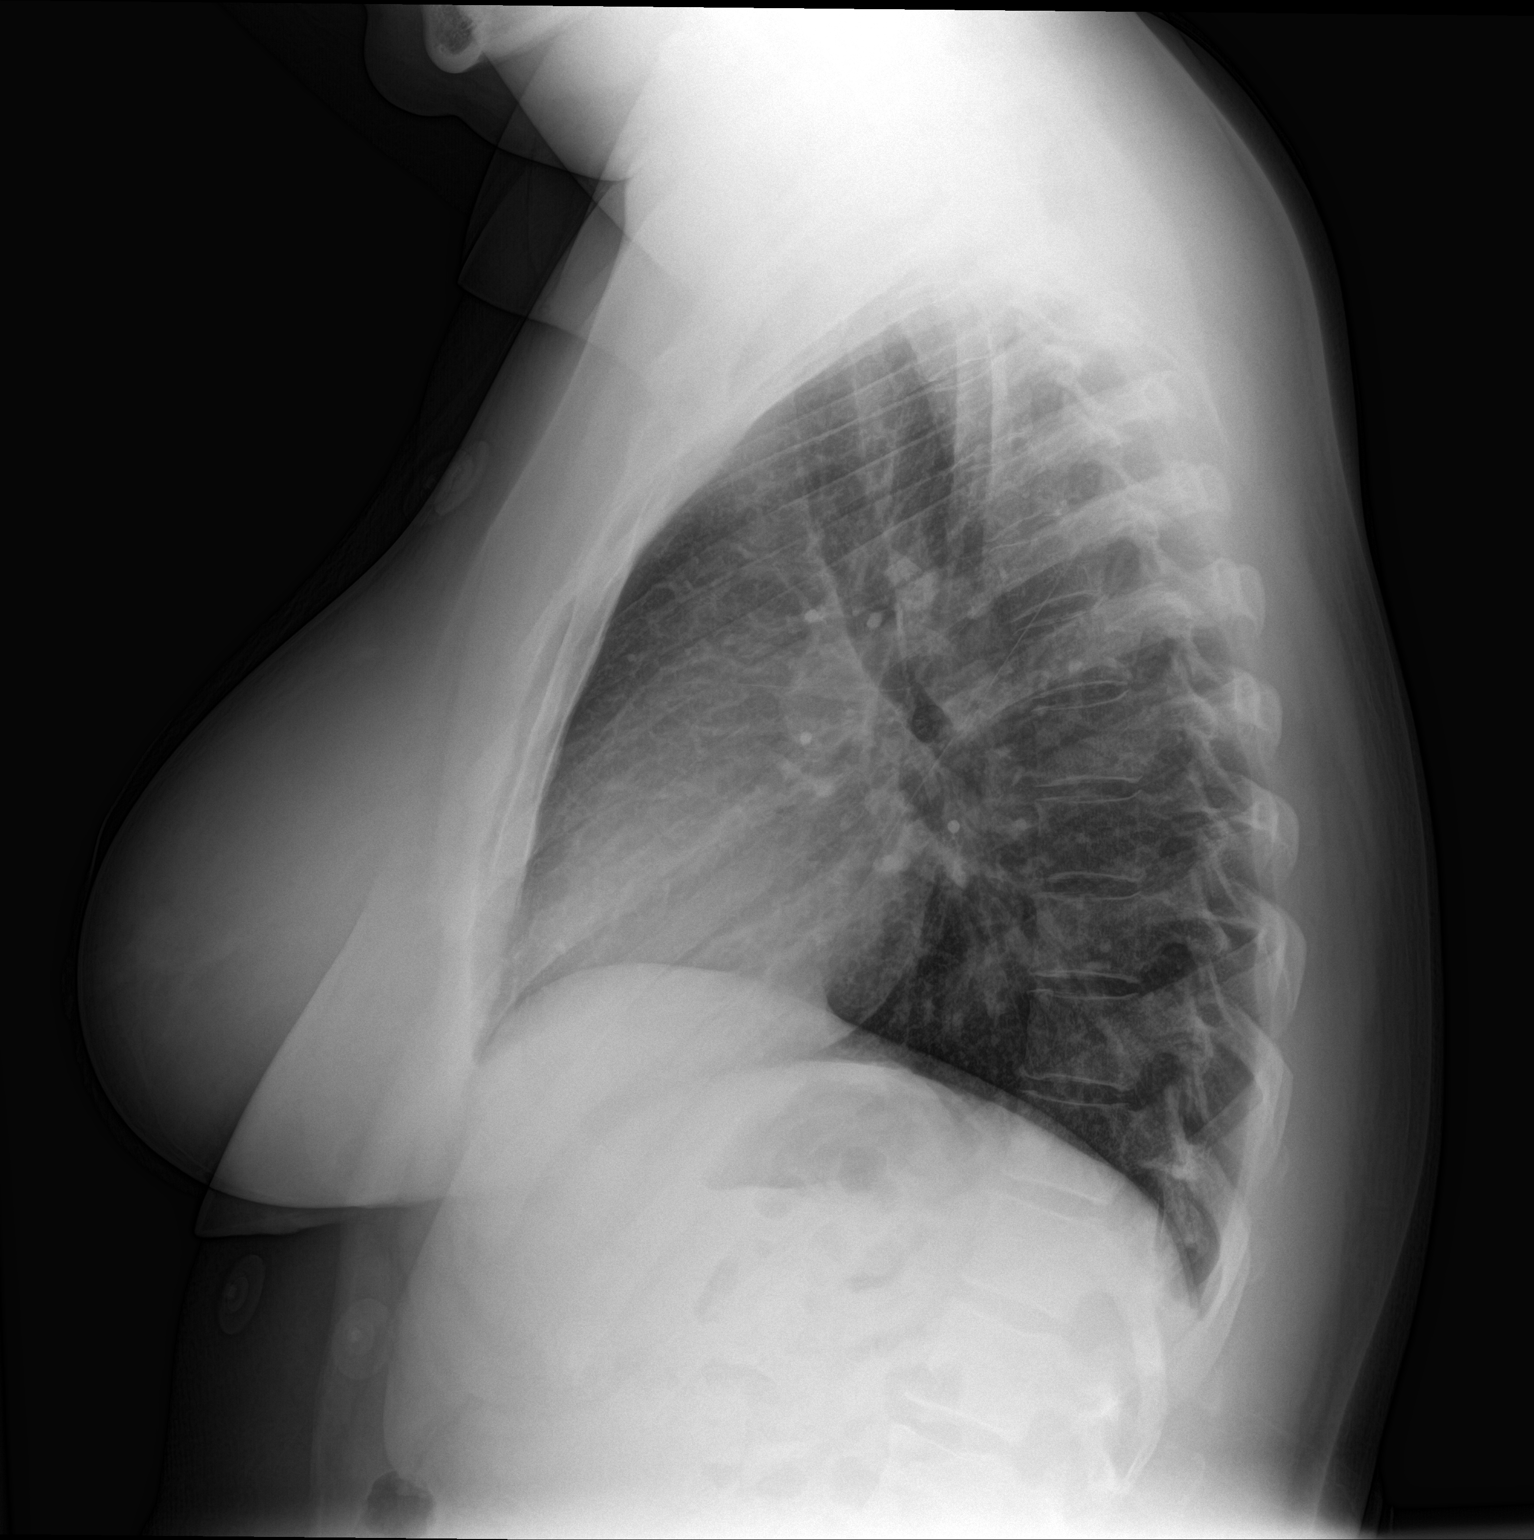

[2 of 2 positions shown; findings below may reference images not displayed]

FINDINGS: The heart size and mediastinal contours are within normal limits.
Both lungs are clear. The visualized skeletal structures are
unremarkable.
IMPRESSION: No active cardiopulmonary disease.

## 2015-06-11 NOTE — ED Notes (Signed)
Pt. presents with multiple complaints : Central chest pain with mild SOB radiating to left ribcage worse with deep inspiration /dry cough , itchy skin rashes at arms /body  For 2 weeks , left hip and low back pain onset last week denies injury  and anxiety attack this evening .

## 2015-06-12 ENCOUNTER — Emergency Department (HOSPITAL_COMMUNITY)
Admission: EM | Admit: 2015-06-12 | Discharge: 2015-06-12 | Disposition: A | Discharge status: Home / Self Care | Payer: Medicaid Other | Attending: Emergency Medicine | Admitting: Emergency Medicine

## 2015-06-12 MED ORDER — ALBUTEROL SULFATE (2.5 MG/3ML) 0.083% IN NEBU
INHALATION_SOLUTION | RESPIRATORY_TRACT | Status: AC
  Filled 2015-06-12: qty 6

## 2015-06-12 MED ORDER — ALBUTEROL SULFATE (2.5 MG/3ML) 0.083% IN NEBU
5.0000 mg | INHALATION_SOLUTION | Freq: Once | RESPIRATORY_TRACT | Status: AC
  Administered 2015-06-12: 5 mg via RESPIRATORY_TRACT

## 2015-06-12 NOTE — ED Notes (Signed)
Pt stated that she was going to urgent care in the morning. Return precautions given. Pt ambulated out of the department

## 2015-06-12 NOTE — ED Notes (Signed)
Nebulizer treatment administered for pt.'s SOB , O2 sat= 98% room air .

## 2015-06-15 ENCOUNTER — Encounter (HOSPITAL_COMMUNITY): Payer: Self-pay | Admitting: *Deleted

## 2015-06-15 ENCOUNTER — Emergency Department (HOSPITAL_COMMUNITY): Payer: Medicaid Other

## 2015-06-15 ENCOUNTER — Emergency Department (HOSPITAL_COMMUNITY)
Admission: EM | Admit: 2015-06-15 | Discharge: 2015-06-15 | Disposition: A | Discharge status: Home / Self Care | Payer: Medicaid Other | Attending: Emergency Medicine | Admitting: Emergency Medicine

## 2015-06-15 DIAGNOSIS — L259 Unspecified contact dermatitis, unspecified cause: Secondary | ICD-10-CM | POA: Diagnosis not present

## 2015-06-15 DIAGNOSIS — Z8659 Personal history of other mental and behavioral disorders: Secondary | ICD-10-CM | POA: Diagnosis not present

## 2015-06-15 DIAGNOSIS — Z3202 Encounter for pregnancy test, result negative: Secondary | ICD-10-CM | POA: Insufficient documentation

## 2015-06-15 DIAGNOSIS — M25552 Pain in left hip: Secondary | ICD-10-CM | POA: Diagnosis not present

## 2015-06-15 DIAGNOSIS — R079 Chest pain, unspecified: Secondary | ICD-10-CM | POA: Insufficient documentation

## 2015-06-15 DIAGNOSIS — R109 Unspecified abdominal pain: Secondary | ICD-10-CM | POA: Insufficient documentation

## 2015-06-15 DIAGNOSIS — Z862 Personal history of diseases of the blood and blood-forming organs and certain disorders involving the immune mechanism: Secondary | ICD-10-CM | POA: Diagnosis not present

## 2015-06-15 DIAGNOSIS — J45909 Unspecified asthma, uncomplicated: Secondary | ICD-10-CM | POA: Insufficient documentation

## 2015-06-15 DIAGNOSIS — R42 Dizziness and giddiness: Secondary | ICD-10-CM | POA: Diagnosis present

## 2015-06-15 DIAGNOSIS — Z87891 Personal history of nicotine dependence: Secondary | ICD-10-CM | POA: Diagnosis not present

## 2015-06-15 DIAGNOSIS — Z79899 Other long term (current) drug therapy: Secondary | ICD-10-CM | POA: Insufficient documentation

## 2015-06-15 DIAGNOSIS — R5383 Other fatigue: Secondary | ICD-10-CM | POA: Insufficient documentation

## 2015-06-15 DIAGNOSIS — M545 Low back pain: Secondary | ICD-10-CM | POA: Insufficient documentation

## 2015-06-15 DIAGNOSIS — L309 Dermatitis, unspecified: Secondary | ICD-10-CM

## 2015-06-15 LAB — BASIC METABOLIC PANEL
ANION GAP: 11 (ref 5–15)
BUN: 11 mg/dL (ref 6–20)
CO2: 20 mmol/L — ABNORMAL LOW (ref 22–32)
Calcium: 9.6 mg/dL (ref 8.9–10.3)
Chloride: 109 mmol/L (ref 101–111)
Creatinine, Ser: 0.86 mg/dL (ref 0.44–1.00)
GFR calc Af Amer: >60 mL/min (ref >60)
Glucose, Bld: 114 mg/dL — ABNORMAL HIGH (ref 65–99)
POTASSIUM: 4 mmol/L (ref 3.5–5.1)
SODIUM: 140 mmol/L (ref 135–145)

## 2015-06-15 LAB — HEPATIC FUNCTION PANEL
ALK PHOS: 77 U/L (ref 38–126)
ALT: 12 U/L — AB (ref 14–54)
AST: 13 U/L — ABNORMAL LOW (ref 15–41)
Albumin: 3.6 g/dL (ref 3.5–5.0)
BILIRUBIN TOTAL: 0.3 mg/dL (ref 0.3–1.2)
Bilirubin, Direct: <0.1 mg/dL — ABNORMAL LOW (ref 0.1–0.5)
TOTAL PROTEIN: 7.3 g/dL (ref 6.5–8.1)

## 2015-06-15 LAB — CBC
HEMATOCRIT: 36.3 % (ref 36.0–46.0)
HEMOGLOBIN: 12.2 g/dL (ref 12.0–15.0)
MCH: 28.3 pg (ref 26.0–34.0)
MCHC: 33.6 g/dL (ref 30.0–36.0)
MCV: 84.2 fL (ref 78.0–100.0)
Platelets: 257 10*3/uL (ref 150–400)
RBC: 4.31 MIL/uL (ref 3.87–5.11)
RDW: 13.5 % (ref 11.5–15.5)
WBC: 6.9 10*3/uL (ref 4.0–10.5)

## 2015-06-15 LAB — I-STAT BETA HCG BLOOD, ED (MC, WL, AP ONLY)

## 2015-06-15 LAB — I-STAT TROPONIN, ED: Troponin i, poc: 0 ng/mL (ref 0.00–0.08)

## 2015-06-15 LAB — SEDIMENTATION RATE: SED RATE: 46 mm/h — AB (ref 0–22)

## 2015-06-15 IMAGING — DX DG HIP (WITH OR WITHOUT PELVIS) 2-3V*L*
3 series · 3 of 3 positions shown · non-contrast
Comparison: CT scan of the abdomen and pelvis dated [DATE]

CLINICAL DATA: Left hip pain since her hip popped 1 week ago.

EXAM:
DG HIP (WITH OR WITHOUT PELVIS) 2-3V LEFT

[pelvis ap]
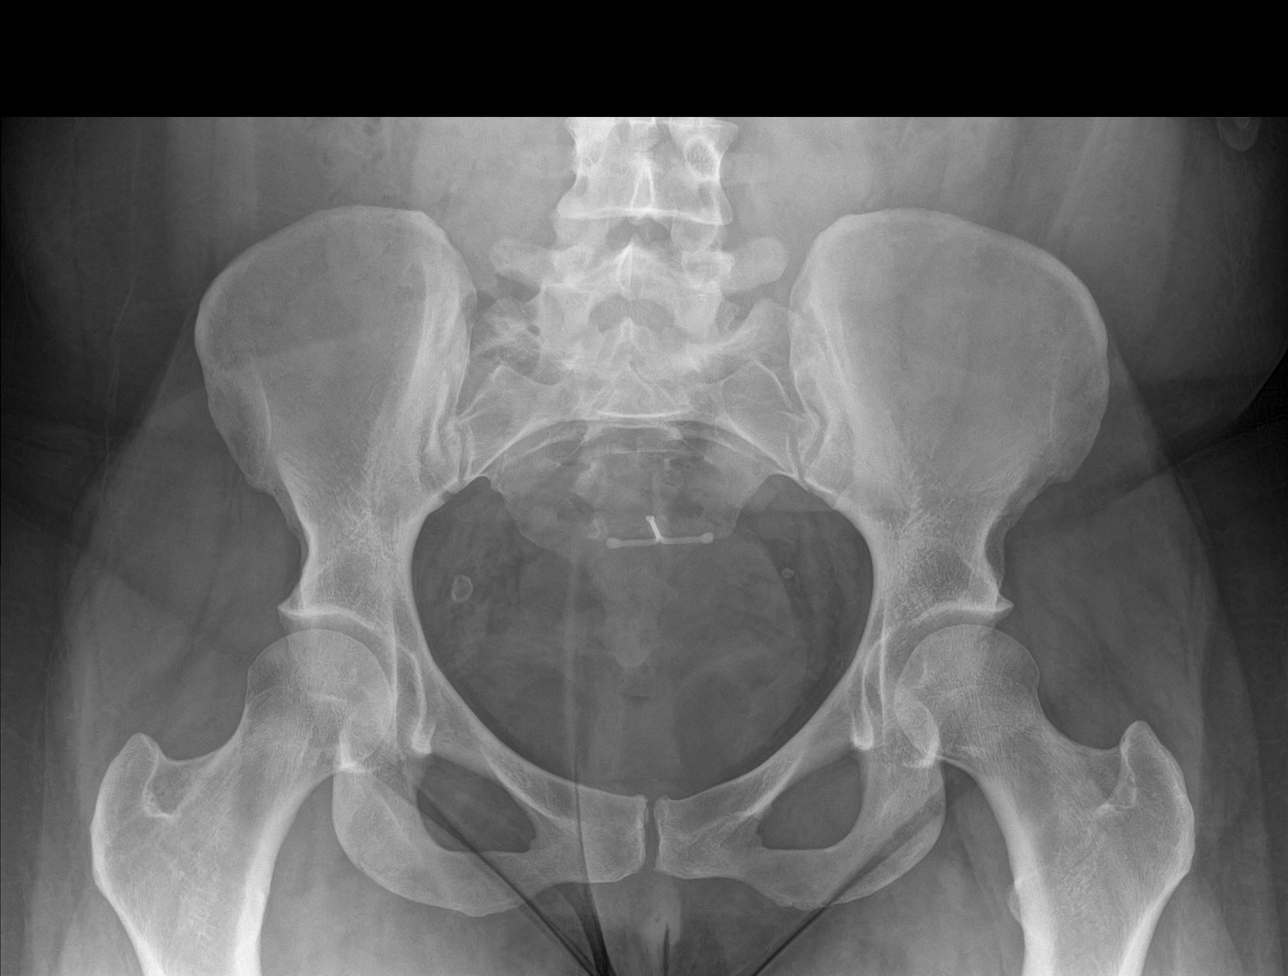

[hip ap]
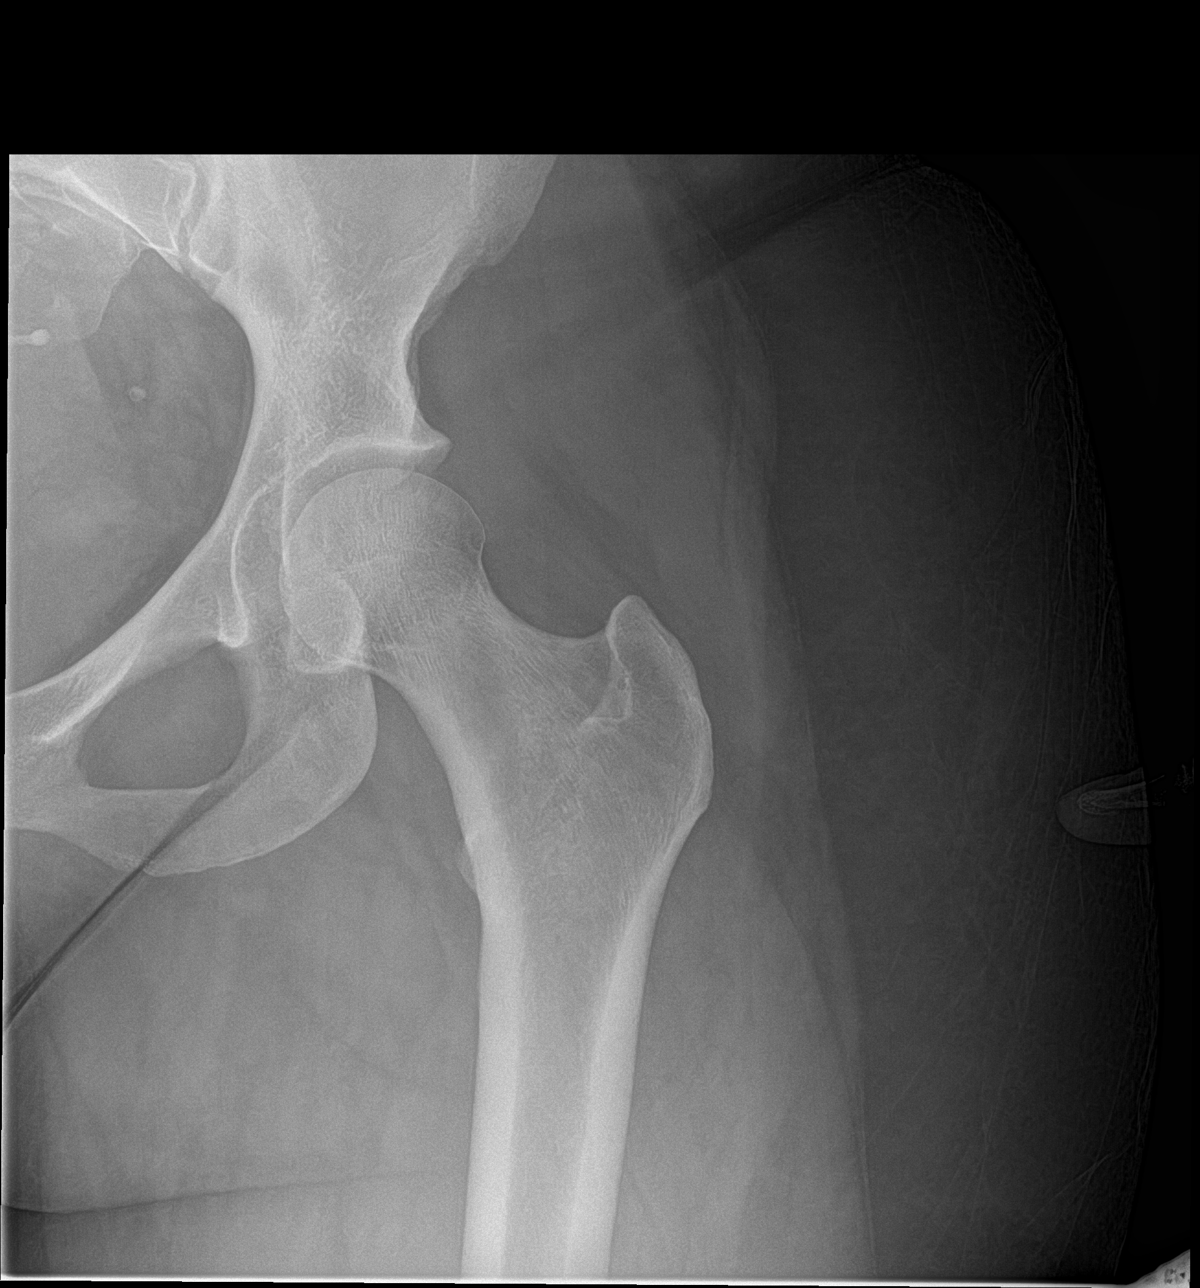

[hip lat]
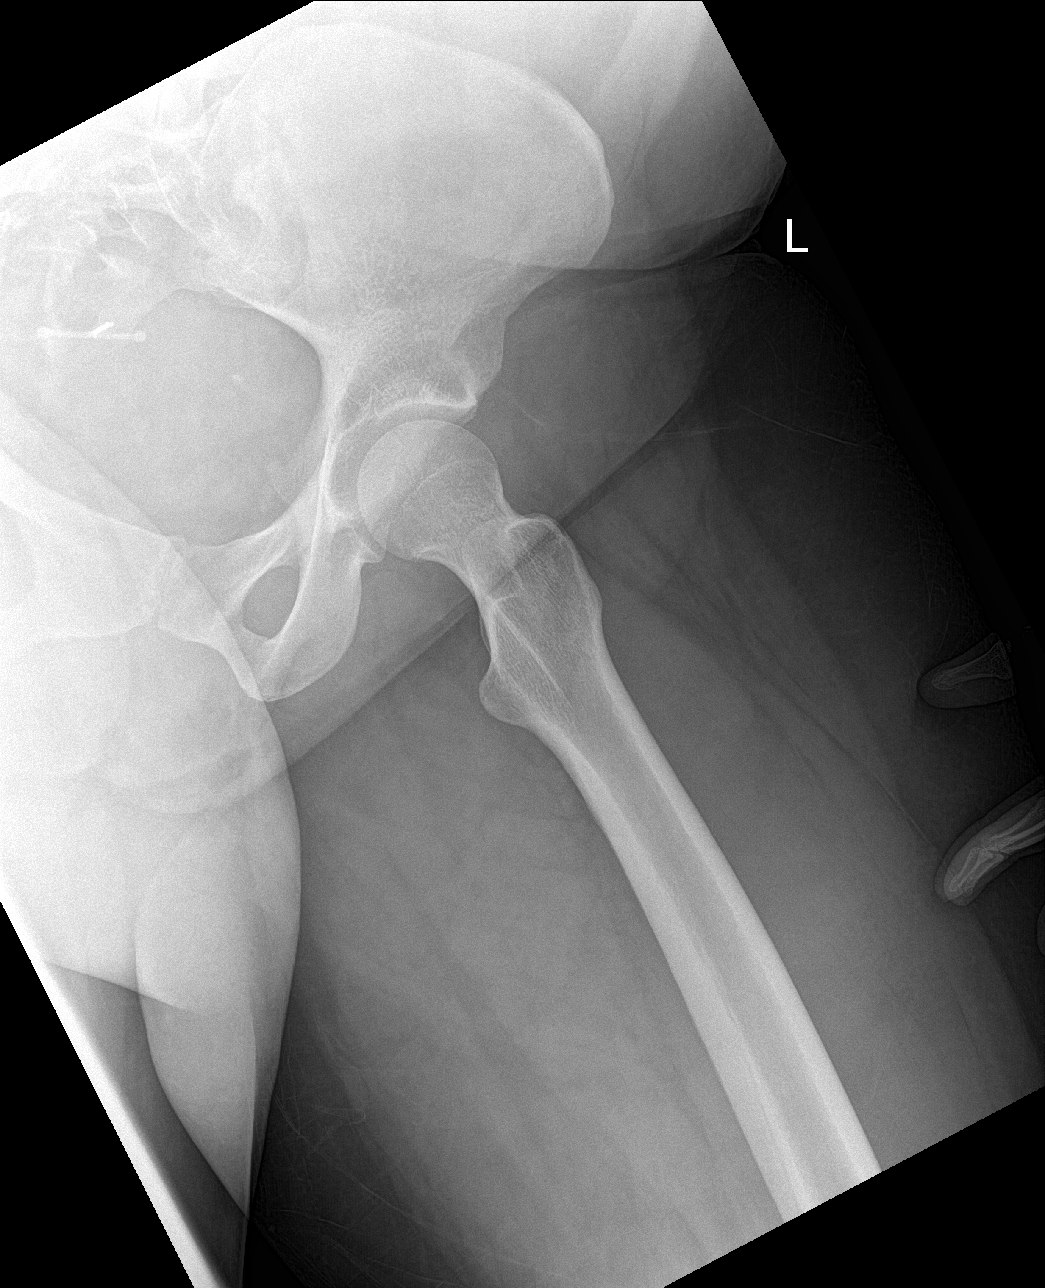

[3 of 3 positions shown; findings below may reference images not displayed]

FINDINGS: There is no evidence of hip fracture or dislocation. There is no
evidence of arthropathy or other focal bone abnormality.
IMPRESSION: Negative.

## 2015-06-15 MED ORDER — ONDANSETRON HCL 4 MG PO TABS: 4.0000 mg | ORAL_TABLET | Freq: Four times a day (QID) | ORAL | Status: DC

## 2015-06-15 MED ORDER — TRIAMCINOLONE ACETONIDE 0.1 % EX CREA: 1.0000 "application " | TOPICAL_CREAM | Freq: Four times a day (QID) | CUTANEOUS | Status: DC | PRN

## 2015-06-15 MED ORDER — ONDANSETRON HCL 4 MG/2ML IJ SOLN
4.0000 mg | Freq: Once | INTRAMUSCULAR | Status: AC
  Administered 2015-06-15: 4 mg via INTRAVENOUS
  Filled 2015-06-15: qty 2

## 2015-06-15 MED ORDER — OXYCODONE HCL 15 MG PO TABS: 15.0000 mg | ORAL_TABLET | ORAL | Status: DC | PRN

## 2015-06-15 MED ORDER — HYDROMORPHONE HCL 1 MG/ML IJ SOLN
1.0000 mg | Freq: Once | INTRAMUSCULAR | Status: AC
  Administered 2015-06-15: 1 mg via INTRAVENOUS
  Filled 2015-06-15: qty 1

## 2015-06-15 MED ORDER — OXYCODONE-ACETAMINOPHEN 5-325 MG PO TABS
2.0000 | ORAL_TABLET | Freq: Once | ORAL | Status: AC
  Administered 2015-06-15: 2 via ORAL
  Filled 2015-06-15: qty 2

## 2015-06-15 MED ORDER — SODIUM CHLORIDE 0.9 % IV BOLUS (SEPSIS)
1000.0000 mL | Freq: Once | INTRAVENOUS | Status: AC
  Administered 2015-06-15: 1000 mL via INTRAVENOUS

## 2015-06-15 NOTE — ED Notes (Signed)
MD at bedside. 

## 2015-06-15 NOTE — Discharge Instructions (Signed)
Eczema Eczema, also called atopic dermatitis, is a skin disorder that causes inflammation of the skin. It causes a red rash and dry, scaly skin. The skin becomes very itchy. Eczema is generally worse during the cooler winter months and often improves with the warmth of summer. Eczema usually starts showing signs in infancy. Some children outgrow eczema, but it may last through adulthood.  CAUSES  The exact cause of eczema is not known, but it appears to run in families. People with eczema often have a family history of eczema, allergies, asthma, or hay fever. Eczema is not contagious. Flare-ups of the condition may be caused by:   Contact with something you are sensitive or allergic to.   Stress. SIGNS AND SYMPTOMS  Dry, scaly skin.   Red, itchy rash.   Itchiness. This may occur before the skin rash and may be very intense.  DIAGNOSIS  The diagnosis of eczema is usually made based on symptoms and medical history. TREATMENT  Eczema cannot be cured, but symptoms usually can be controlled with treatment and other strategies. A treatment plan might include:  Controlling the itching and scratching.   Use over-the-counter antihistamines as directed for itching. This is especially useful at night when the itching tends to be worse.   Use over-the-counter steroid creams as directed for itching.   Avoid scratching. Scratching makes the rash and itching worse. It may also result in a skin infection (impetigo) due to a break in the skin caused by scratching.   Keeping the skin well moisturized with creams every day. This will seal in moisture and help prevent dryness. Lotions that contain alcohol and water should be avoided because they can dry the skin.   Limiting exposure to things that you are sensitive or allergic to (allergens).   Recognizing situations that cause stress.   Developing a plan to manage stress.  HOME CARE INSTRUCTIONS   Only take over-the-counter or  prescription medicines as directed by your health care provider.   Do not use anything on the skin without checking with your health care provider.   Keep baths or showers short (5 minutes) in warm (not hot) water. Use mild cleansers for bathing. These should be unscented. You may add nonperfumed bath oil to the bath water. It is best to avoid soap and bubble bath.   Immediately after a bath or shower, when the skin is still damp, apply a moisturizing ointment to the entire body. This ointment should be a petroleum ointment. This will seal in moisture and help prevent dryness. The thicker the ointment, the better. These should be unscented.   Keep fingernails cut short. Children with eczema may need to wear soft gloves or mittens at night after applying an ointment.   Dress in clothes made of cotton or cotton blends. Dress lightly, because heat increases itching.   A child with eczema should stay away from anyone with fever blisters or cold sores. The virus that causes fever blisters (herpes simplex) can cause a serious skin infection in children with eczema. SEEK MEDICAL CARE IF:   Your itching interferes with sleep.   Your rash gets worse or is not better within 1 week after starting treatment.   You see pus or soft yellow scabs in the rash area.   You have a fever.   You have a rash flare-up after contact with someone who has fever blisters.    This information is not intended to replace advice given to you by your health care   provider. Make sure you discuss any questions you have with your health care provider.   Document Released: 01/29/2000 Document Revised: 11/21/2012 Document Reviewed: 09/03/2012 Elsevier Interactive Patient Education 2016 [employer]  

## 2015-06-15 NOTE — ED Notes (Signed)
Pt is here with chest tightness, left hip pain from where it has gone out, then lower back pain and left breast pain.  Pt has scattered rashed areas that burn and itch

## 2015-06-15 NOTE — ED Provider Notes (Signed)
CSN: [id number]     Arrival date & time 06/15/15  4098 History   First MD Initiated Contact with Patient 06/15/15 1010     Chief Complaint  Patient presents with  . Chest Pain  . Dizziness  . Hip Pain  . Abdominal Pain     (Consider location/radiation/quality/duration/timing/severity/associated sxs/prior Treatment) HPI Patient with history of PTSD, sickle cell trait, anxiety, presents with multiple complaints. She primarily complains of left hip pain that has been ongoing for about a month. She says that it occasionally causes her to walk with a limp. She says it seems to begun after walking down some steps, she felt it "give out" and has been intermittently painful since. She denies any unilateral leg swelling. She says the pain is sharp. She also complains of left breast pain that has been ongoing for 3 days. She denies feeling any lumps. She has no history of breast cancer in her family. She has had no fevers or constitutional symptoms of infection. She also complains of a rash that has been scattered across her body, does have a history of eczema, and has a similar appearance. She reports that she has had abdominal pain intermittently for several weeks, no appetite, nausea, but no vomiting. She has had loose stools, but denies any dark, tarry, or bloody stools.  Past Medical History  Diagnosis Date  . Family history of anesthesia complication     son due sleep apnea  . Sleep apnea     awaiting a sleep study to be done at [place]  . Asthma   . Shortness of breath     with exertion or panic attack  . Bronchitis, acute   . PTSD (post-traumatic stress disorder)   . Anxiety   . Depression   . Diverticular disease   . Headache(784.0)     migraines  . Blood dyscrasia     sickle trait  . Anemia   . Sickle cell anemia (HCC) trait    patient claims asymptomatic  . Anxiety    Past Surgical History  Procedure Laterality Date  . Eye surgery      x 10 as a child  . 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 Cesarean section  2010, 2012,2014    x 3  . Breast surgery Bilateral     reductions  . Tonsillectomy N/A 10/07/2013    Procedure: TONSILLECTOMY;  Surgeon: Christia Reading, MD;  Location: [facility] OR;  Service: ENT;  Laterality: N/A;   No family history on file. Social History  Substance Use Topics  . Smoking status: Former Smoker -- 0.25 packs/day for 1 years    Types: Cigarettes    Quit date: 07/02/2013  . Smokeless tobacco: Never Used  . Alcohol Use: Yes     Comment: social   OB History    No data available     Review of Systems  Constitutional: Positive for activity change, appetite change and fatigue. Negative for fever and chills.  Eyes: Negative for visual disturbance.  Respiratory: Positive for chest tightness.   Cardiovascular: Positive for chest pain.  Gastrointestinal: Positive for nausea and abdominal pain. Negative for anal bleeding.  Genitourinary: Negative for dysuria.  Musculoskeletal: Positive for back pain and arthralgias. Negative for joint swelling.  Psychiatric/Behavioral: Positive for dysphoric mood.  All other systems reviewed and are negative.     Allergies  Other; Nsaids; and Sulfa antibiotics  Home Medications   Prior to Admission medications   Medication Sig  Start Date End Date Taking? Authorizing Provider  acetaminophen (TYLENOL) 500 MG tablet Take 1 tablet (500 mg total) by mouth every 6 (six) hours as needed. 04/10/15  Yes Cheri FowlerKayla Rose, PA-C  albuterol (PROVENTIL HFA;VENTOLIN HFA) 108 (90 Base) MCG/ACT inhaler Inhale 2 puffs into the lungs every 4 (four) hours as needed for wheezing or shortness of breath. 04/10/15   Cheri FowlerKayla Rose, PA-C  chlorpheniramine-HYDROcodone (TUSSIONEX PENNKINETIC ER) 10-8 MG/5ML SUER Take 5 mLs by mouth at bedtime as needed for cough. 04/10/15   Cheri FowlerKayla Rose, PA-C  lidocaine (XYLOCAINE) 2 % solution Use as directed 20 mLs in the mouth or throat as needed for mouth pain. 04/10/15   Kayla Rose, PA-C  ondansetron  (ZOFRAN ODT) 4 MG disintegrating tablet Take 1 tablet (4 mg total) by mouth every 8 (eight) hours as needed for nausea. 04/05/15   Garlon HatchetLisa M Sanders, PA-C  ondansetron (ZOFRAN) 4 MG tablet Take 1 tablet (4 mg total) by mouth every 6 (six) hours. 06/15/15   Erskine Emeryhris Marlaine Arey, MD  oxyCODONE (ROXICODONE) 15 MG immediate release tablet Take 1 tablet (15 mg total) by mouth every 4 (four) hours as needed for pain. 06/15/15   Erskine Emeryhris Dyshon Philbin, MD  triamcinolone cream (KENALOG) 0.1 % Apply 1 application topically 4 (four) times daily as needed. For 5 days 06/15/15   Erskine Emeryhris Dorotha Hirschi, MD   BP 121/68 mmHg  Pulse 69  Temp(Src) 98.1 F (36.7 C) (Oral)  Resp 18  Ht 5\' 6"  (1.676 m)  Wt 92.987 kg  BMI 33.10 kg/m2  SpO2 100% Physical Exam  Constitutional: She is oriented to person, place, and time. She appears well-developed and well-nourished. No distress.  HENT:  Head: Normocephalic and atraumatic.  Cardiovascular: Normal rate, regular rhythm and intact distal pulses.   Pulmonary/Chest: Effort normal and breath sounds normal. No respiratory distress.  Abdominal: There is tenderness. There is no rebound (diffusely tender) and no guarding.  Musculoskeletal: Normal range of motion. She exhibits no edema.       Left hip: She exhibits tenderness. She exhibits no crepitus and no deformity.  Neurological: She is alert and oriented to person, place, and time.  Skin: Skin is warm and dry. No erythema.  Scattered, excematous plaques over the right upper extremity, left lower extremity, and lower back  Vitals reviewed.   ED Course  Procedures (including critical care time) Labs Review Labs Reviewed  BASIC METABOLIC PANEL - Abnormal; Notable for the following:    CO2 20 (*)    Glucose, Bld 114 (*)    All other components within normal limits  SEDIMENTATION RATE - Abnormal; Notable for the following:    Sed Rate 46 (*)    All other components within normal limits  HEPATIC FUNCTION PANEL - Abnormal; Notable for the following:     AST 13 (*)    ALT 12 (*)    Bilirubin, Direct <0.1 (*)    All other components within normal limits  CBC  I-STAT TROPOININ, ED  I-STAT BETA HCG BLOOD, ED ([facility], [facility], [facility] ONLY)    Imaging Review Dg Hip Unilat With Pelvis 2-3 Views Left  06/15/2015  CLINICAL DATA:  Left hip pain since her hip popped 1 week ago. EXAM: DG HIP (WITH OR WITHOUT PELVIS) 2-3V LEFT COMPARISON:  CT scan of the abdomen and pelvis dated 10/16/2014 FINDINGS: There is no evidence of hip fracture or dislocation. There is no evidence of arthropathy or other focal bone abnormality. IMPRESSION: Negative. Electronically Signed   By: Francene BoyersJames  Maxwell  M.D.   On: 06/15/2015 11:31   I have personally reviewed and evaluated these images and lab results as part of my medical decision-making.   EKG Interpretation None      MDM   Final diagnoses:  Eczema  Other fatigue   Patient presenting with multiple complaints. The patient has diffuse excoriated eczematous plaques on on her right upper arm, lower back, and chest consistent consistent with eczema. She has not tried any steroid creams, no mucocutaneous involvement, no systemic signs of infection, will start her on steroid cream.  Planes of abdominal pain, and on review of her records this appears to be a chronic complaint. She has no evidence of peritonitis, no abnormal vital signs, no leukocytosis and unremarkable labs. Doubt appendicitis, cholecystitis, peritonitis.  She also complains of hip pain, x-ray negative for effusion or fracture.  No evidence of acute emergent condition seeding, patient is very tearful, and I believe a significant amount of these complaints are being driven by anxiety and stress. I have urged the importance of PCP follow-up. Return regards discussed.  Erskine Emery, MD 06/16/15 1610  Linwood Dibbles, MD 06/17/15 [phone]

## 2015-06-15 NOTE — ED Notes (Signed)
Patient transported to X-ray 

## 2016-01-26 ENCOUNTER — Ambulatory Visit (HOSPITAL_COMMUNITY)
Admission: EM | Admit: 2016-01-26 | Discharge: 2016-01-26 | Disposition: A | Discharge status: Home / Self Care | Payer: Medicaid Other | Attending: Emergency Medicine | Admitting: Emergency Medicine

## 2016-01-26 ENCOUNTER — Encounter (HOSPITAL_COMMUNITY): Payer: Self-pay | Admitting: Family Medicine

## 2016-01-26 ENCOUNTER — Emergency Department (HOSPITAL_COMMUNITY)
Admission: EM | Admit: 2016-01-26 | Discharge: 2016-01-26 | Disposition: A | Discharge status: Home / Self Care | Payer: Medicaid Other | Attending: Emergency Medicine | Admitting: Emergency Medicine

## 2016-01-26 ENCOUNTER — Emergency Department (HOSPITAL_COMMUNITY): Payer: Medicaid Other

## 2016-01-26 ENCOUNTER — Encounter (HOSPITAL_COMMUNITY): Payer: Self-pay | Admitting: *Deleted

## 2016-01-26 DIAGNOSIS — J45909 Unspecified asthma, uncomplicated: Secondary | ICD-10-CM | POA: Diagnosis not present

## 2016-01-26 DIAGNOSIS — R102 Pelvic and perineal pain: Secondary | ICD-10-CM | POA: Insufficient documentation

## 2016-01-26 DIAGNOSIS — R112 Nausea with vomiting, unspecified: Secondary | ICD-10-CM

## 2016-01-26 DIAGNOSIS — Z79899 Other long term (current) drug therapy: Secondary | ICD-10-CM | POA: Insufficient documentation

## 2016-01-26 DIAGNOSIS — Z87891 Personal history of nicotine dependence: Secondary | ICD-10-CM | POA: Insufficient documentation

## 2016-01-26 DIAGNOSIS — R1031 Right lower quadrant pain: Secondary | ICD-10-CM | POA: Diagnosis not present

## 2016-01-26 DIAGNOSIS — A599 Trichomoniasis, unspecified: Secondary | ICD-10-CM

## 2016-01-26 LAB — CBC
HEMATOCRIT: 34.7 % — AB (ref 36.0–46.0)
HEMOGLOBIN: 12 g/dL (ref 12.0–15.0)
MCH: 28.8 pg (ref 26.0–34.0)
MCHC: 34.6 g/dL (ref 30.0–36.0)
MCV: 83.2 fL (ref 78.0–100.0)
Platelets: 257 10*3/uL (ref 150–400)
RBC: 4.17 MIL/uL (ref 3.87–5.11)
RDW: 13 % (ref 11.5–15.5)
WBC: 7.7 10*3/uL (ref 4.0–10.5)

## 2016-01-26 LAB — LIPASE, BLOOD: LIPASE: 13 U/L (ref 11–51)

## 2016-01-26 LAB — POCT I-STAT, CHEM 8
BUN: 14 mg/dL (ref 6–20)
CREATININE: 0.8 mg/dL (ref 0.44–1.00)
Calcium, Ion: 1.17 mmol/L (ref 1.15–1.40)
Chloride: 105 mmol/L (ref 101–111)
Glucose, Bld: 94 mg/dL (ref 65–99)
HCT: 39 % (ref 36.0–46.0)
HEMOGLOBIN: 13.3 g/dL (ref 12.0–15.0)
Potassium: 4 mmol/L (ref 3.5–5.1)
SODIUM: 140 mmol/L (ref 135–145)
TCO2: 24 mmol/L (ref 0–100)

## 2016-01-26 LAB — COMPREHENSIVE METABOLIC PANEL
ALBUMIN: 3.5 g/dL (ref 3.5–5.0)
ALT: 15 U/L (ref 14–54)
ANION GAP: 6 (ref 5–15)
AST: 13 U/L — ABNORMAL LOW (ref 15–41)
Alkaline Phosphatase: 81 U/L (ref 38–126)
BILIRUBIN TOTAL: 0.5 mg/dL (ref 0.3–1.2)
BUN: 10 mg/dL (ref 6–20)
CHLORIDE: 109 mmol/L (ref 101–111)
CO2: 24 mmol/L (ref 22–32)
Calcium: 9 mg/dL (ref 8.9–10.3)
Creatinine, Ser: 0.82 mg/dL (ref 0.44–1.00)
GFR calc Af Amer: >60 mL/min (ref >60)
GFR calc non Af Amer: >60 mL/min (ref >60)
GLUCOSE: 90 mg/dL (ref 65–99)
POTASSIUM: 3.6 mmol/L (ref 3.5–5.1)
SODIUM: 139 mmol/L (ref 135–145)
TOTAL PROTEIN: 7 g/dL (ref 6.5–8.1)

## 2016-01-26 LAB — WET PREP, GENITAL
CLUE CELLS WET PREP: NONE SEEN
SPERM: NONE SEEN
Yeast Wet Prep HPF POC: NONE SEEN

## 2016-01-26 IMAGING — CT CT ABD-PELV W/ CM
2 of 4 series · 17 of 46 positions shown, 19 images · IV contrast (Omni 300)
Comparison: CT abdomen pelvis [DATE]

CLINICAL DATA: Right lower quadrant abdominal pain.

EXAM:
CT ABDOMEN AND PELVIS WITH CONTRAST
TECHNIQUE: Multidetector CT imaging of the abdomen and pelvis was performed
using the standard protocol following bolus administration of
intravenous contrast.
CONTRAST:  100mL [NU] IOPAMIDOL ([NU]) INJECTION 61%

[Series 2: a/p w/ 5mm · axial · 0.98mm/px · z∈[-641,-236]mm · 14 of 91 slices shown, 16 images]
[im 5/91  soft-tissue]
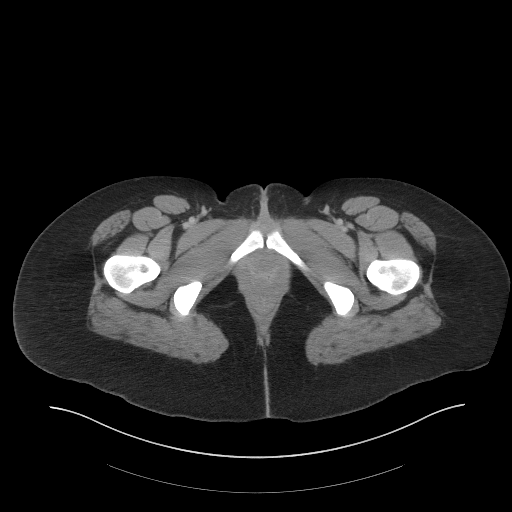
[im 5/91  bone]
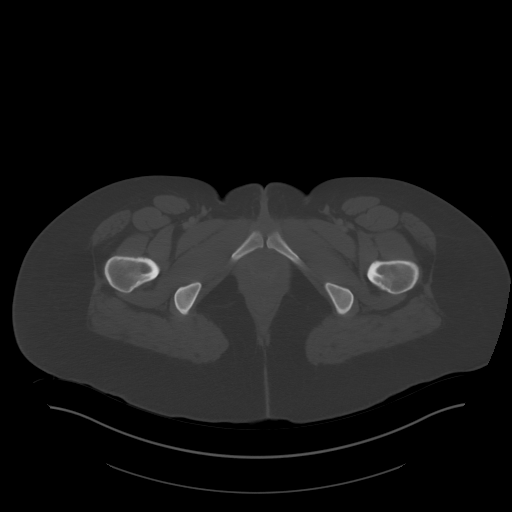
[im 14/91  soft-tissue]
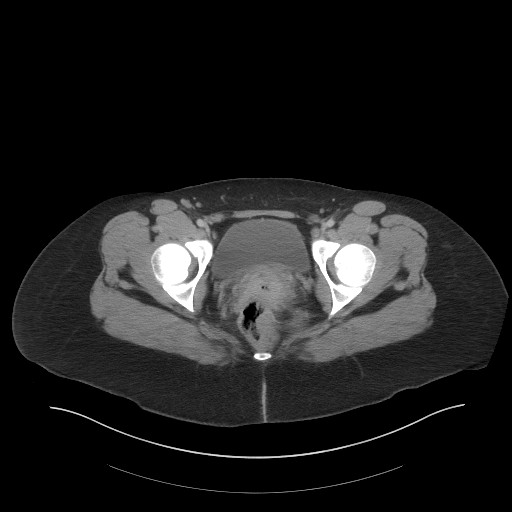
[im 19/91  soft-tissue]
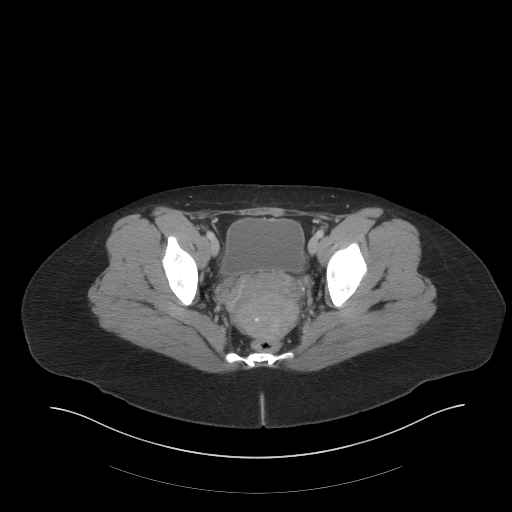
[im 23/91  soft-tissue]
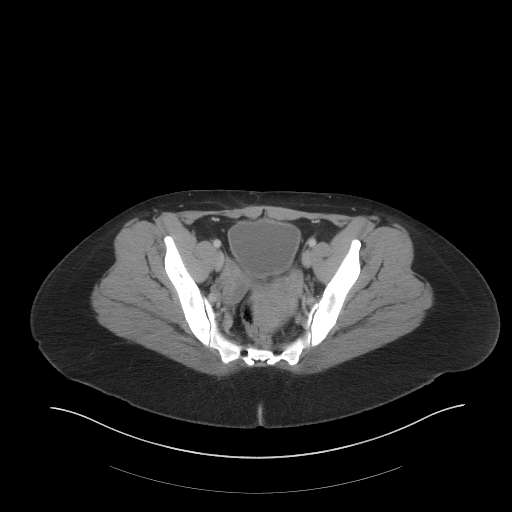
[im 32/91  soft-tissue]
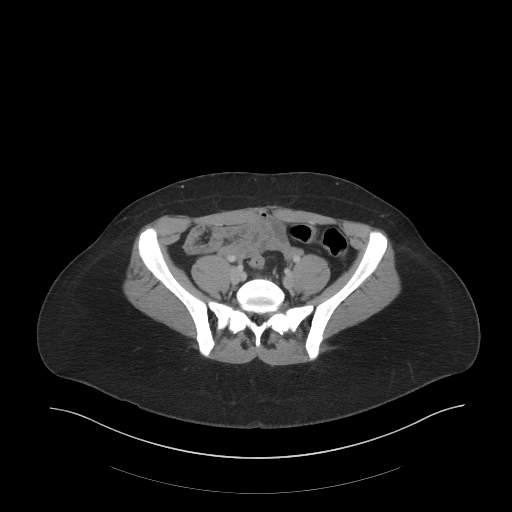
[im 37/91  soft-tissue]
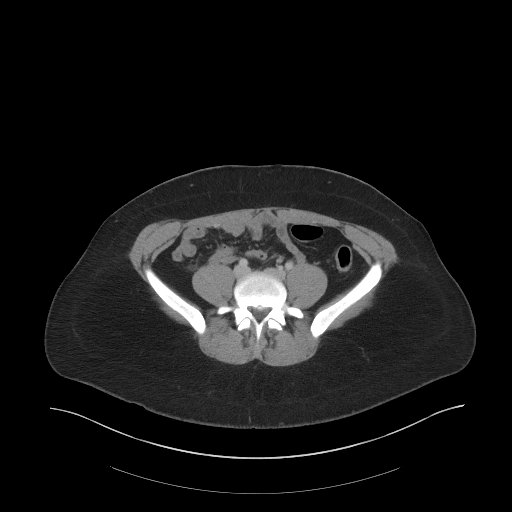
[im 41/91  soft-tissue]
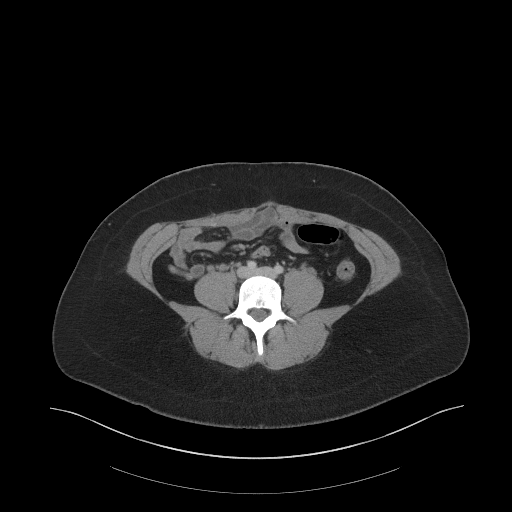
[im 50/91  soft-tissue]
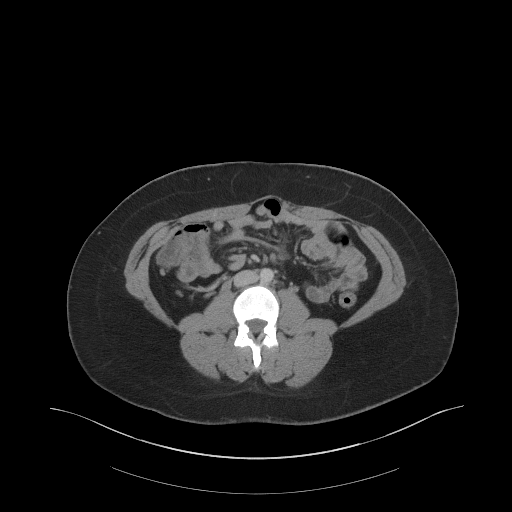
[im 55/91  soft-tissue]
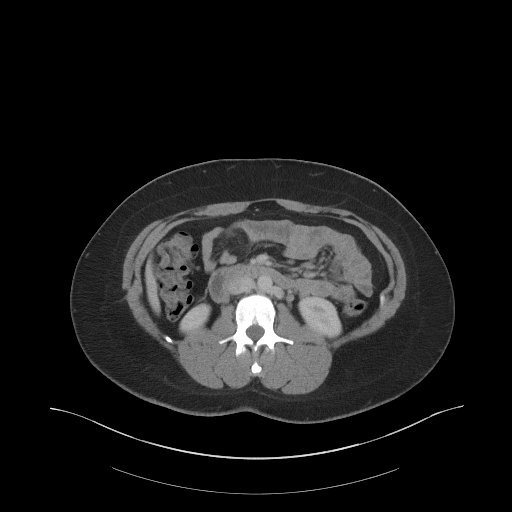
[im 55/91  bone]
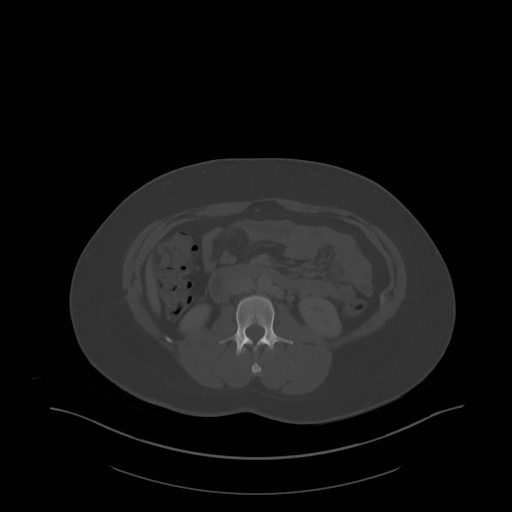
[im 59/91  soft-tissue]
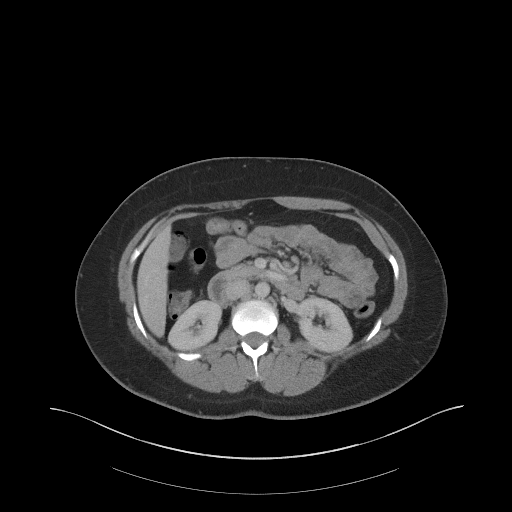
[im 68/91  soft-tissue]
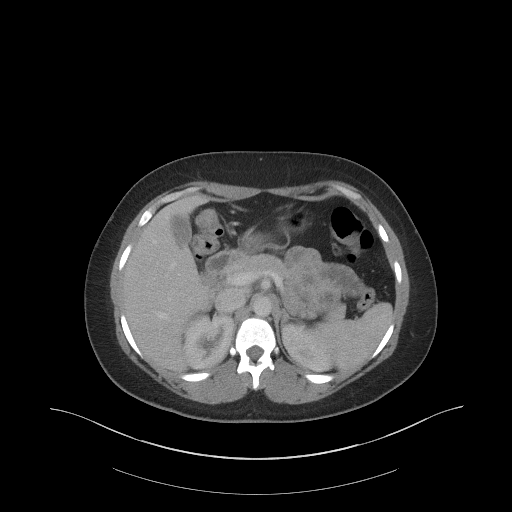
[im 73/91  soft-tissue]
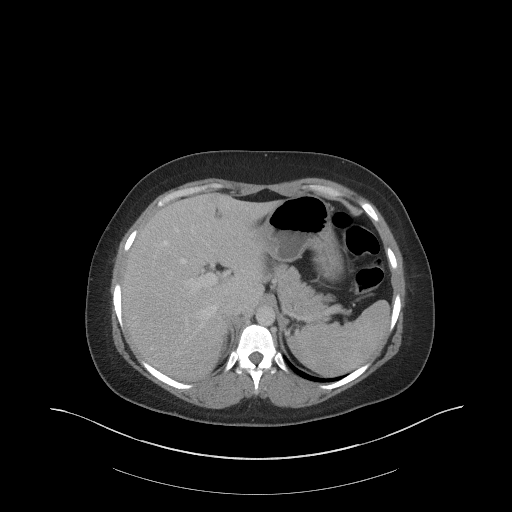
[im 77/91  soft-tissue]
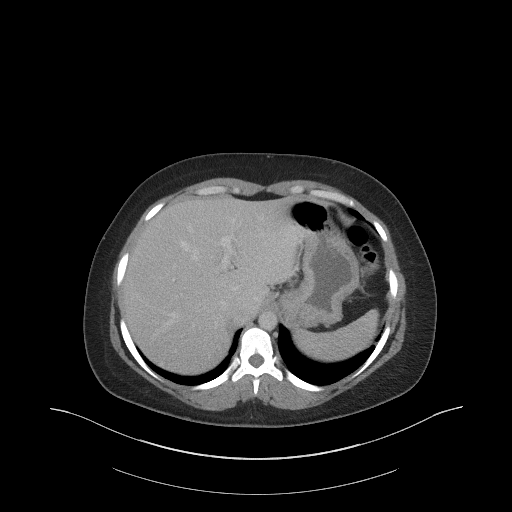
[im 86/91  soft-tissue]
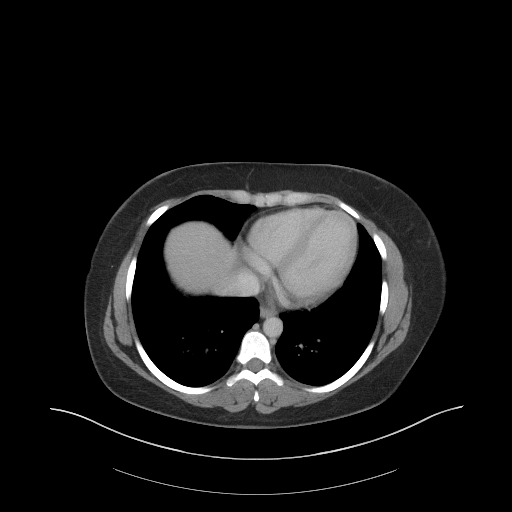

[Series 5: a/p w/ cor · coronal · 0.88mm/px · 3 of 125 slices shown]
[im 42/125  soft-tissue]
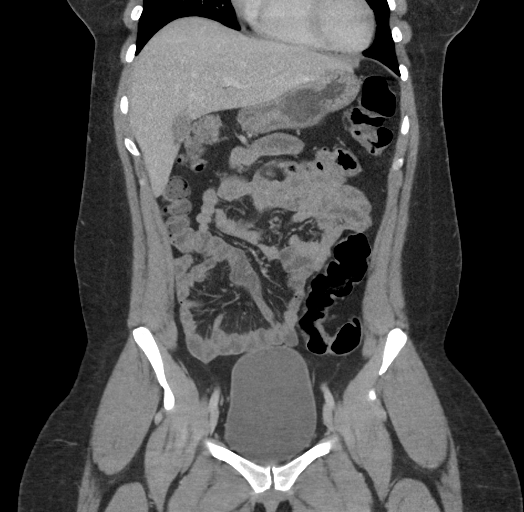
[im 56/125  soft-tissue]
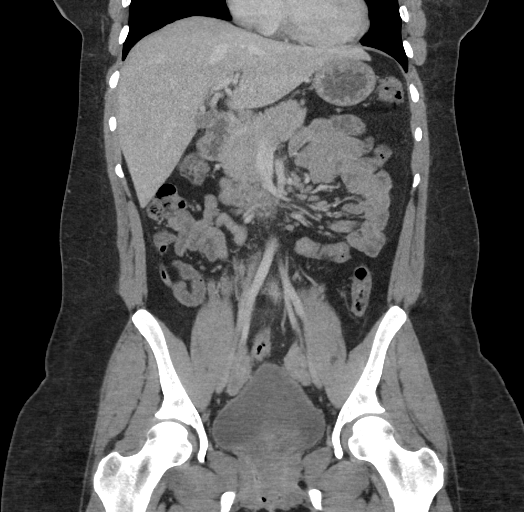
[im 69/125  soft-tissue]
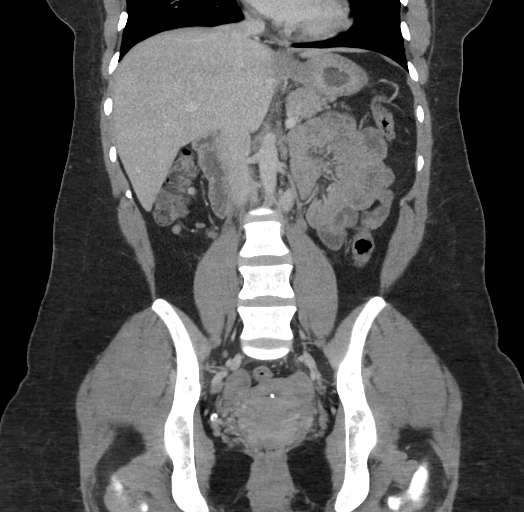

[17 of 46 positions shown; findings below may reference images not displayed]

FINDINGS: Lower chest: No pulmonary nodules. No visible pleural or pericardial
effusion.

Hepatobiliary: Normal hepatic size and contours without focal liver
lesion. No perihepatic ascites. No intra- or extrahepatic biliary
dilatation. Normal gallbladder.

Pancreas: Normal pancreatic contours and enhancement. No
peripancreatic fluid collection or pancreatic ductal dilatation.

Spleen: Normal.

Adrenals/Urinary Tract: Normal adrenal glands. No hydronephrosis or
solid renal mass.

Stomach/Bowel: No abnormal bowel dilatation. No bowel wall
thickening or adjacent fat stranding to indicate acute inflammation.
No abdominal fluid collection. Normal appendix.

Vascular/Lymphatic: Normal course and caliber of the major abdominal
vessels. There is a retroaortic left renal vein. No abdominal or
pelvic adenopathy.

Reproductive: There is a T-shaped contraceptive device within the
retroverted uterus. No adnexal or ovarian mass.

Musculoskeletal: No lytic or blastic osseous lesion. Normal
visualized extrathoracic and extraperitoneal soft tissues.

Other: No contributory non-categorized findings.
IMPRESSION: Normal appendix.  No acute abnormality of the abdomen or pelvis.

## 2016-01-26 MED ORDER — ONDANSETRON HCL 4 MG/2ML IJ SOLN
4.0000 mg | Freq: Once | INTRAMUSCULAR | Status: AC
  Administered 2016-01-26: 4 mg via INTRAVENOUS

## 2016-01-26 MED ORDER — DOXYCYCLINE HYCLATE 100 MG PO CAPS: 100.0000 mg | ORAL_CAPSULE | Freq: Two times a day (BID) | ORAL | 0 refills | Status: DC

## 2016-01-26 MED ORDER — ONDANSETRON HCL 4 MG/2ML IJ SOLN
INTRAMUSCULAR | Status: AC
  Filled 2016-01-26: qty 2

## 2016-01-26 MED ORDER — ONDANSETRON HCL 4 MG/2ML IJ SOLN
4.0000 mg | Freq: Once | INTRAMUSCULAR | Status: AC
  Administered 2016-01-26: 4 mg via INTRAVENOUS
  Filled 2016-01-26: qty 2

## 2016-01-26 MED ORDER — DEXTROSE 5 % IV SOLN
250.0000 mg | Freq: Once | INTRAVENOUS | Status: AC
  Administered 2016-01-26: 250 mg via INTRAVENOUS
  Filled 2016-01-26: qty 250

## 2016-01-26 MED ORDER — AZITHROMYCIN 250 MG PO TABS
1000.0000 mg | ORAL_TABLET | Freq: Once | ORAL | Status: AC
  Administered 2016-01-26: 1000 mg via ORAL
  Filled 2016-01-26: qty 4

## 2016-01-26 MED ORDER — IOPAMIDOL (ISOVUE-300) INJECTION 61%
INTRAVENOUS | Status: AC
  Administered 2016-01-26: 100 mL
  Filled 2016-01-26: qty 100

## 2016-01-26 MED ORDER — METRONIDAZOLE 500 MG PO TABS: 500.0000 mg | ORAL_TABLET | Freq: Two times a day (BID) | ORAL | 0 refills | Status: DC

## 2016-01-26 MED ORDER — MORPHINE SULFATE (PF) 4 MG/ML IV SOLN
4.0000 mg | Freq: Once | INTRAVENOUS | Status: AC
  Administered 2016-01-26: 4 mg via INTRAVENOUS
  Filled 2016-01-26: qty 1

## 2016-01-26 MED ORDER — SODIUM CHLORIDE 0.9 % IV BOLUS (SEPSIS)
1000.0000 mL | Freq: Once | INTRAVENOUS | Status: AC
  Administered 2016-01-26: 1000 mL via INTRAVENOUS

## 2016-01-26 NOTE — ED Notes (Signed)
Pt called out to nurse's station. Pt tearful upon entering room. States pain no any better & rates it 8/10.

## 2016-01-26 NOTE — ED Provider Notes (Signed)
[facility]    CSN: [id number] Arrival date & time: 01/26/16  1409     History   Chief Complaint Chief Complaint  Patient presents with  . Emesis  . Nausea    HPI Leah Shea is a 30 y.o. female.   HPI She is a 30 year old woman here for vomiting and abdominal pain. Her symptoms started last night with vomiting, diarrhea, and abdominal pain. She reports subjective fever with chills and sweats last night. Today, she states she feels very clammy. She has been unable to keep anything down since yesterday evening. She has also been unable to pass gas or have a bowel movement since about 8 PM yesterday. She reports diffuse abdominal pain, but worse in the right side and right lower quadrant. She also reports pain in the right chest and chest tightness. Pain is worse with standing upright. Her kids were sick with the stomach flu last week.   Past Medical History:  Diagnosis Date  . Anemia   . Anxiety   . Anxiety   . Asthma   . Blood dyscrasia    sickle trait  . Bronchitis, acute   . Depression   . Diverticular disease   . Family history of anesthesia complication    son due sleep apnea  . Headache(784.0)    migraines  . PTSD (post-traumatic stress disorder)   . Shortness of breath    with exertion or panic attack  . Sickle cell anemia (HCC) trait   patient claims asymptomatic  . Sleep apnea    [facility]    Patient Active Problem List   Diagnosis Date Noted  . Tonsillar hypertrophy 10/07/2013    Past Surgical History:  Procedure Laterality Date  . BREAST SURGERY Bilateral    reductions  . CESAREAN SECTION  2010, [id number]   x 3  . EYE SURGERY     x 10 as a child  . TONSILLECTOMY N/A 10/07/2013   Procedure: TONSILLECTOMY;  Surgeon: Christia Readingwight Bates, MD;  [facility];  Service: ENT;  Laterality: N/A;  . TUMOR REMOVAL     tumor removed from back    OB History    No data available       Home Medications    Prior  to Admission medications   Medication Sig Start Date End Date Taking? Authorizing Provider  acetaminophen (TYLENOL) 500 MG tablet Take 1 tablet (500 mg total) by mouth every 6 (six) hours as needed. 04/10/15   Cheri FowlerKayla Rose, PA-C  albuterol (PROVENTIL HFA;VENTOLIN HFA) 108 (90 Base) MCG/ACT inhaler Inhale 2 puffs into the lungs every 4 (four) hours as needed for wheezing or shortness of breath. 04/10/15   Cheri FowlerKayla Rose, PA-C  chlorpheniramine-HYDROcodone (TUSSIONEX PENNKINETIC ER) 10-8 MG/5ML SUER Take 5 mLs by mouth at bedtime as needed for cough. 04/10/15   Cheri FowlerKayla Rose, PA-C  lidocaine (XYLOCAINE) 2 % solution Use as directed 20 mLs in the mouth or throat as needed for mouth pain. 04/10/15   Kayla Rose, PA-C  ondansetron (ZOFRAN ODT) 4 MG disintegrating tablet Take 1 tablet (4 mg total) by mouth every 8 (eight) hours as needed for nausea. 04/05/15   Garlon HatchetLisa M Sanders, PA-C  ondansetron (ZOFRAN) 4 MG tablet Take 1 tablet (4 mg total) by mouth every 6 (six) hours. 06/15/15   Erskine Emeryhris Davis, MD  oxyCODONE (ROXICODONE) 15 MG immediate release tablet Take 1 tablet (15 mg total) by mouth every 4 (four) hours as needed for  pain. 06/15/15   Erskine Emeryhris Davis, MD  triamcinolone cream (KENALOG) 0.1 % Apply 1 application topically 4 (four) times daily as needed. For 5 days 06/15/15   Erskine Emeryhris Davis, MD    Family History History reviewed. No pertinent family history.  Social History Social History  Substance Use Topics  . Smoking status: Former Smoker    Packs/day: 0.25    Years: 1.00    Types: Cigarettes    Quit date: 07/02/2013  . Smokeless tobacco: Never Used  . Alcohol use Yes     Comment: social     Allergies   Other; Nsaids; and Sulfa antibiotics   Review of Systems Review of Systems As in history of present illness  Physical Exam Triage Vital Signs ED Triage Vitals  Enc Vitals Group     BP 01/26/16 1439 126/95     Pulse Rate 01/26/16 1439 102     Resp 01/26/16 1439 18     Temp 01/26/16 1439 98.4 F (36.9 C)       Temp src --      SpO2 01/26/16 1439 99 %     Weight --      Height --      Head Circumference --      Peak Flow --      Pain Score 01/26/16 1441 7     Pain Loc --      Pain Edu? --      Excl. in GC? --    No data found.   Updated Vital Signs BP 126/95   Pulse 102   Temp 98.4 F (36.9 C)   Resp 18   SpO2 99%   Visual Acuity Right Eye Distance:   Left Eye Distance:   Bilateral Distance:    Right Eye Near:   Left Eye Near:    Bilateral Near:     Physical Exam  Constitutional: She is oriented to person, place, and time. She appears well-developed and well-nourished. She appears distressed (hunched over in wheelchair).  Neck: Neck supple.  Cardiovascular: Regular rhythm and normal heart sounds.   No murmur heard. Mild tachycardia  Pulmonary/Chest: Effort normal.  Abdominal: Soft. She exhibits no distension. There is tenderness (diffuse but worse in RLQ). There is no rebound and no guarding.  Neurological: She is alert and oriented to person, place, and time.     UC Treatments / Results  Labs (all labs ordered are listed, but only abnormal results are displayed) Labs Reviewed  POCT I-STAT, CHEM 8    EKG  EKG Interpretation None       Radiology No results found.  Procedures Procedures (including critical care time)  Medications Ordered in UC Medications  ondansetron (ZOFRAN) injection 4 mg (not administered)  sodium chloride 0.9 % bolus 1,000 mL (not administered)     Initial Impression / Assessment and Plan / UC Course  I have reviewed the triage vital signs and the nursing notes.  Pertinent labs & imaging results that were available during my care of the patient were reviewed by me and considered in my medical decision making (see chart for details).  Clinical Course     Halfway through saline bolus, patient reports feeling a little bit worse. She continues to be nauseated, despite receiving 4 mg of IV Zofran. We'll finish bolus here,  saline lock IV, and transfer to ED via shuttle for additional workup and evaluation. Right lower quadrant tenderness is concerning for possible appendicitis.  Final Clinical Impressions(s) / UC Diagnoses  Final diagnoses:  Non-intractable vomiting with nausea, unspecified vomiting type  Right lower quadrant abdominal pain    New Prescriptions New Prescriptions   No medications on file     Charm Rings, MD 01/26/16 1526

## 2016-01-26 NOTE — ED Triage Notes (Signed)
Pt here for N,V,D since yesterday. Not keeping any fluids down. sts her kids recently had the stomach virus. sts body aches.

## 2016-01-26 NOTE — Discharge Instructions (Signed)
We will take it to the emergency room for additional evaluation and management. While this is an unusual presentation, appendicitis is possible.

## 2016-01-26 NOTE — ED Notes (Signed)
Mini lab states preg test negative, has not crossed over into epic. CT made aware.

## 2016-01-26 NOTE — ED Triage Notes (Signed)
Pt reports abd pain and n/v/d. Went to [facility] and had iv started and given zofran. Pt reports increase in n/v since being given zofran. Pt reports now feels like she needs to have bm but is unable.

## 2016-01-26 NOTE — ED Provider Notes (Signed)
[facility] Provider Note   CSN: [id number] Arrival date & time: 01/26/16  1600     History   Chief Complaint Chief Complaint  Patient presents with  . Abdominal Pain  . Emesis    HPI Leah Shea is a 30 y.o. female.  30 yo F with a chief complaint of lower abdominal pain. This been going on for the past day. Started yesterday and now has gotten worse. Migrated to the right lower quadrant. Having some nausea and vomiting as well. Denies fevers or chills. Initially started with diarrhea now constipation. Having some subjective fevers and chills.   The history is provided by the patient.  Abdominal Pain   This is a new problem. The current episode started yesterday. The problem occurs constantly. The problem has been gradually worsening. The pain is associated with an unknown factor. The pain is located in the RLQ. The quality of the pain is sharp and shooting. The pain is at a severity of 10/10. The pain is severe. Associated symptoms include nausea and vomiting. Pertinent negatives include fever, dysuria, headaches, arthralgias and myalgias. The symptoms are aggravated by certain positions, activity and palpation. Nothing relieves the symptoms.  Emesis   Associated symptoms include abdominal pain. Pertinent negatives include no arthralgias, no chills, no fever, no headaches and no myalgias.    Past Medical History:  Diagnosis Date  . Anemia   . Anxiety   . Anxiety   . Asthma   . Blood dyscrasia    sickle trait  . Bronchitis, acute   . Depression   . Diverticular disease   . Family history of anesthesia complication    son due sleep apnea  . Headache(784.0)    migraines  . PTSD (post-traumatic stress disorder)   . Shortness of breath    with exertion or panic attack  . Sickle cell anemia (HCC) trait   patient claims asymptomatic  . Sleep apnea    awaiting a sleep study to be done at [facility]    Patient Active Problem List   Diagnosis Date Noted  . Tonsillar  hypertrophy 10/07/2013    Past Surgical History:  Procedure Laterality Date  . BREAST SURGERY Bilateral    reductions  . CESAREAN SECTION  2010, [id number]   x 3  . EYE SURGERY     x 10 as a child  . TONSILLECTOMY N/A 10/07/2013   Procedure: TONSILLECTOMY;  Surgeon: Christia Reading, MD;  Location: [facility];  Service: ENT;  Laterality: N/A;  . TUMOR REMOVAL     tumor removed from back    OB History    No data available       Home Medications    Prior to Admission medications   Medication Sig Start Date End Date Taking? Authorizing Provider  albuterol (PROVENTIL HFA;VENTOLIN HFA) 108 (90 Base) MCG/ACT inhaler Inhale 2 puffs into the lungs every 4 (four) hours as needed for wheezing or shortness of breath. 04/10/15  Yes Kayla Rose, PA-C  ondansetron (ZOFRAN ODT) 4 MG disintegrating tablet Take 1 tablet (4 mg total) by mouth every 8 (eight) hours as needed for nausea. 04/05/15  Yes Garlon Hatchet, PA-C  acetaminophen (TYLENOL) 500 MG tablet Take 1 tablet (500 mg total) by mouth every 6 (six) hours as needed. Patient not taking: Reported on 01/26/2016 04/10/15   Cheri Fowler, PA-C  chlorpheniramine-HYDROcodone [facility] PENNKINETIC ER) 10-8 MG/5ML SUER Take 5 mLs by mouth at bedtime as needed for cough. Patient not taking: Reported on 01/26/2016 04/10/15  Cheri FowlerKayla Rose, PA-C  doxycycline (VIBRAMYCIN) 100 MG capsule Take 1 capsule (100 mg total) by mouth 2 (two) times daily. 01/26/16   Melene Planan Guillermina Shaft, DO  lidocaine (XYLOCAINE) 2 % solution Use as directed 20 mLs in the mouth or throat as needed for mouth pain. Patient not taking: Reported on 01/26/2016 04/10/15   Cheri FowlerKayla Rose, PA-C  metroNIDAZOLE (FLAGYL) 500 MG tablet Take 1 tablet (500 mg total) by mouth 2 (two) times daily. 01/26/16   Melene Planan Kamali Sakata, DO  ondansetron (ZOFRAN) 4 MG tablet Take 1 tablet (4 mg total) by mouth every 6 (six) hours. Patient not taking: Reported on 01/26/2016 06/15/15   Erskine Emeryhris Davis, MD  oxyCODONE (ROXICODONE) 15 MG immediate release  tablet Take 1 tablet (15 mg total) by mouth every 4 (four) hours as needed for pain. Patient not taking: Reported on 01/26/2016 06/15/15   Erskine Emeryhris Davis, MD  triamcinolone cream (KENALOG) 0.1 % Apply 1 application topically 4 (four) times daily as needed. For 5 days Patient not taking: Reported on 01/26/2016 06/15/15   Erskine Emeryhris Davis, MD    Family History History reviewed. No pertinent family history.  Social History Social History  Substance Use Topics  . Smoking status: Former Smoker    Packs/day: 0.25    Years: 1.00    Types: Cigarettes    Quit date: 07/02/2013  . Smokeless tobacco: Never Used  . Alcohol use Yes     Comment: social     Allergies   Other; Nsaids; and Sulfa antibiotics   Review of Systems Review of Systems  Constitutional: Negative for chills and fever.  HENT: Negative for congestion and rhinorrhea.   Eyes: Negative for redness and visual disturbance.  Respiratory: Negative for shortness of breath and wheezing.   Cardiovascular: Negative for chest pain and palpitations.  Gastrointestinal: Positive for abdominal pain, nausea and vomiting.  Genitourinary: Negative for dysuria and urgency.  Musculoskeletal: Negative for arthralgias and myalgias.  Skin: Negative for pallor and wound.  Neurological: Negative for dizziness and headaches.     Physical Exam Updated Vital Signs BP 131/81   Pulse 88   Temp 98.7 F (37.1 C) (Oral)   Resp 15   SpO2 99%   Physical Exam  Constitutional: She is oriented to person, place, and time. She appears well-developed and well-nourished. No distress.  HENT:  Head: Normocephalic and atraumatic.  Eyes: EOM are normal. Pupils are equal, round, and reactive to light.  Neck: Normal range of motion. Neck supple.  Cardiovascular: Normal rate and regular rhythm.  Exam reveals no gallop and no friction rub.   No murmur heard. Pulmonary/Chest: Effort normal. She has no wheezes. She has no rales.  Abdominal: Soft. She exhibits no  distension and no mass. There is tenderness (diffuse, but worst to the RLQ). There is no guarding.  Genitourinary: Cervix exhibits motion tenderness, discharge (purulent) and friability. Right adnexum displays no mass and no tenderness. Left adnexum displays no mass and no tenderness.  Musculoskeletal: She exhibits no edema or tenderness.  Neurological: She is alert and oriented to person, place, and time.  Skin: Skin is warm and dry. She is not diaphoretic.  Psychiatric: She has a normal mood and affect. Her behavior is normal.  Nursing note and vitals reviewed.    ED Treatments / Results  Labs (all labs ordered are listed, but only abnormal results are displayed) Labs Reviewed  WET PREP, GENITAL - Abnormal; Notable for the following:       Result Value   Trich, Wet  Prep PRESENT (*)    WBC, Wet Prep HPF POC MANY (*)    All other components within normal limits  COMPREHENSIVE METABOLIC PANEL - Abnormal; Notable for the following:    AST 13 (*)    All other components within normal limits  CBC - Abnormal; Notable for the following:    HCT 34.7 (*)    All other components within normal limits  LIPASE, BLOOD  URINALYSIS, ROUTINE W REFLEX MICROSCOPIC  RPR  HIV ANTIBODY (ROUTINE TESTING)  I-STAT BETA HCG BLOOD, ED ([facility], [facility], [facility] ONLY)  GC/CHLAMYDIA PROBE AMP ([place]) NOT AT [facility]    EKG  EKG Interpretation None       Radiology Ct Abdomen Pelvis W Contrast  Result Date: 01/26/2016 CLINICAL DATA:  Right lower quadrant abdominal pain. EXAM: CT ABDOMEN AND PELVIS WITH CONTRAST TECHNIQUE: Multidetector CT imaging of the abdomen and pelvis was performed using the standard protocol following bolus administration of intravenous contrast. CONTRAST:  ISOVUE-300 IOPAMIDOL (ISOVUE-300) INJECTION 61% COMPARISON:  CT abdomen pelvis 10/16/2014 FINDINGS: Lower chest: No pulmonary nodules. No visible pleural or pericardial effusion. Hepatobiliary: Normal hepatic size and contours without  focal liver lesion. No perihepatic ascites. No intra- or extrahepatic biliary dilatation. Normal gallbladder. Pancreas: Normal pancreatic contours and enhancement. No peripancreatic fluid collection or pancreatic ductal dilatation. Spleen: Normal. Adrenals/Urinary Tract: Normal adrenal glands. No hydronephrosis or solid renal mass. Stomach/Bowel: No abnormal bowel dilatation. No bowel wall thickening or adjacent fat stranding to indicate acute inflammation. No abdominal fluid collection. Normal appendix. Vascular/Lymphatic: Normal course and caliber of the major abdominal vessels. There is a retroaortic left renal vein. No abdominal or pelvic adenopathy. Reproductive: There is a T-shaped contraceptive device within the retroverted uterus. No adnexal or ovarian mass. Musculoskeletal: No lytic or blastic osseous lesion. Normal visualized extrathoracic and extraperitoneal soft tissues. Other: No contributory non-categorized findings. IMPRESSION: Normal appendix.  No acute abnormality of the abdomen or pelvis. Electronically Signed   By: Deatra Robinson M.D.   On: 01/26/2016 19:57    Procedures Procedures (including critical care time)  Medications Ordered in ED Medications  iopamidol (ISOVUE-300) 61 % injection (100 mLs  Contrast Given 01/26/16 1916)  morphine 4 MG/ML injection 4 mg (4 mg Intravenous Given 01/26/16 2101)  ondansetron (ZOFRAN) injection 4 mg (4 mg Intravenous Given 01/26/16 2101)  sodium chloride 0.9 % bolus 1,000 mL (0 mLs Intravenous Stopped 01/26/16 2255)  cefTRIAXone (ROCEPHIN) 250 mg in dextrose 5 % 50 mL IVPB (0 mg Intravenous Stopped 01/26/16 2236)  azithromycin (ZITHROMAX) tablet 1,000 mg (1,000 mg Oral Given 01/26/16 2205)     Initial Impression / Assessment and Plan / ED Course  I have reviewed the triage vital signs and the nursing notes.  Pertinent labs & imaging results that were available during my care of the patient were reviewed by me and considered in my medical  decision making (see chart for details).  Clinical Course     30 yo With a chief complaint of lower abdominal pain. Patient was seen in triage and had labs and a CT ordered. These were all unremarkable. On my exam patient with significant right lower quadrant tenderness. Pelvic exam concerning for profuse purulent discharge. Cervical motion tenderness. I will treat presumptively for an STD.  Patient is found to have Trichomonas on wet prep. Will treat for PID. PCP follow-up.  11:16 PM:  I have discussed the diagnosis/risks/treatment options with the patient and family and believe the pt to be eligible for discharge home  to follow-up with PCP. We also discussed returning to the ED immediately if new or worsening sx occur. We discussed the sx which are most concerning (e.g., sudden worsening pain, fever, inability to tolerate by mouth) that necessitate immediate return. Medications administered to the patient during their visit and any new prescriptions provided to the patient are listed below.  Medications given during this visit Medications  iopamidol (ISOVUE-300) 61 % injection (100 mLs  Contrast Given 01/26/16 1916)  morphine 4 MG/ML injection 4 mg (4 mg Intravenous Given 01/26/16 2101)  ondansetron (ZOFRAN) injection 4 mg (4 mg Intravenous Given 01/26/16 2101)  sodium chloride 0.9 % bolus 1,000 mL (0 mLs Intravenous Stopped 01/26/16 2255)  cefTRIAXone (ROCEPHIN) 250 mg in dextrose 5 % 50 mL IVPB (0 mg Intravenous Stopped 01/26/16 2236)  azithromycin (ZITHROMAX) tablet 1,000 mg (1,000 mg Oral Given 01/26/16 2205)     The patient appears reasonably screen and/or stabilized for discharge and I doubt any other medical condition or other [facility] requiring further screening, evaluation, or treatment in the ED at this time prior to discharge.    Final Clinical Impressions(s) / ED Diagnoses   Final diagnoses:  Pelvic pain in female  Trichomoniasis    New Prescriptions Discharge Medication  List as of 01/26/2016 10:13 PM    START taking these medications   Details  doxycycline (VIBRAMYCIN) 100 MG capsule Take 1 capsule (100 mg total) by mouth 2 (two) times daily., Starting Tue 01/26/2016, Print    metroNIDAZOLE (FLAGYL) 500 MG tablet Take 1 tablet (500 mg total) by mouth 2 (two) times daily., Starting Tue 01/26/2016, Print         Melene Planan Nattaly Yebra, DO 01/26/16 2316

## 2016-01-27 LAB — RPR: RPR Ser Ql: NONREACTIVE

## 2016-01-27 LAB — I-STAT BETA HCG BLOOD, ED (MC, WL, AP ONLY): I-stat hCG, quantitative: <5 m[IU]/mL (ref <5)

## 2016-01-27 LAB — GC/CHLAMYDIA PROBE AMP (~~LOC~~) NOT AT ARMC
CHLAMYDIA, DNA PROBE: NEGATIVE
NEISSERIA GONORRHEA: NEGATIVE

## 2016-01-27 LAB — HIV ANTIBODY (ROUTINE TESTING W REFLEX): HIV SCREEN 4TH GENERATION: NONREACTIVE

## 2016-03-09 ENCOUNTER — Encounter (HOSPITAL_COMMUNITY): Payer: Self-pay | Admitting: Family Medicine

## 2016-03-09 ENCOUNTER — Emergency Department (HOSPITAL_COMMUNITY)
Admission: EM | Admit: 2016-03-09 | Discharge: 2016-03-09 | Disposition: A | Discharge status: Home / Self Care | Payer: Medicaid Other | Attending: Dermatology | Admitting: Dermatology

## 2016-03-09 DIAGNOSIS — E221 Hyperprolactinemia: Secondary | ICD-10-CM | POA: Diagnosis present

## 2016-03-09 DIAGNOSIS — Z87891 Personal history of nicotine dependence: Secondary | ICD-10-CM | POA: Insufficient documentation

## 2016-03-09 DIAGNOSIS — Z5321 Procedure and treatment not carried out due to patient leaving prior to being seen by health care provider: Secondary | ICD-10-CM | POA: Diagnosis not present

## 2016-03-09 DIAGNOSIS — J45909 Unspecified asthma, uncomplicated: Secondary | ICD-10-CM | POA: Diagnosis not present

## 2016-03-09 NOTE — ED Triage Notes (Signed)
Patient reports in the beginning of January she noticed her left breast swelling, then the right breast swelled a day after. [facility] [place] [facility] [place] Labs were drawn, Amoxacillin and Clindamycin were prescribed. Today, her PROLACTIN came [facility] elevated. [facility] needed [facility]. Also, reports milk production, itching, hot, burning, and skin scabs over both breast. Furthermore, complains of dizziness, blurry vision, and headaches.

## 2016-03-09 NOTE — ED Notes (Signed)
Pt returned her labels to registration. LMBS.

## 2016-10-05 IMAGING — US US ART/VEN ABD/PELV/SCROTUM DOPPLER LTD
1 series · 13 of 25 positions shown · non-contrast
Comparison: CT [DATE]

CLINICAL DATA: Right lower quadrant pain

EXAM:
TRANSABDOMINAL AND TRANSVAGINAL ULTRASOUND OF PELVIS
DOPPLER ULTRASOUND OF OVARIES
TECHNIQUE: Both transabdominal and transvaginal ultrasound examinations of the
pelvis were performed. Transabdominal technique was performed for
global imaging of the pelvis including uterus, ovaries, adnexal
regions, and pelvic cul-de-sac.
It was necessary to proceed with endovaginal exam following the
transabdominal exam to visualize the uterus, endometrium, ovaries
and adnexa . Color and duplex Doppler ultrasound was utilized to
evaluate blood flow to the ovaries.

[Series 1: us art/ven abd/pelv/scrotum doppler ltd · 0.24mm/px · 13 of 64 slices shown]
[im 1/64]
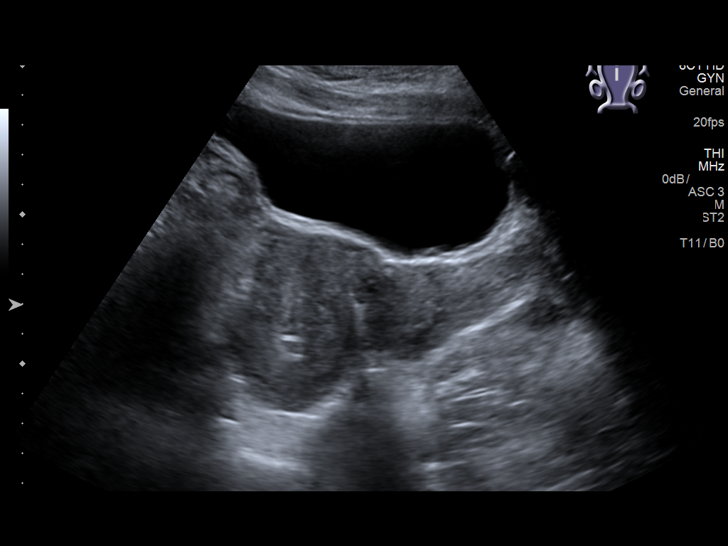
[im 6/64]
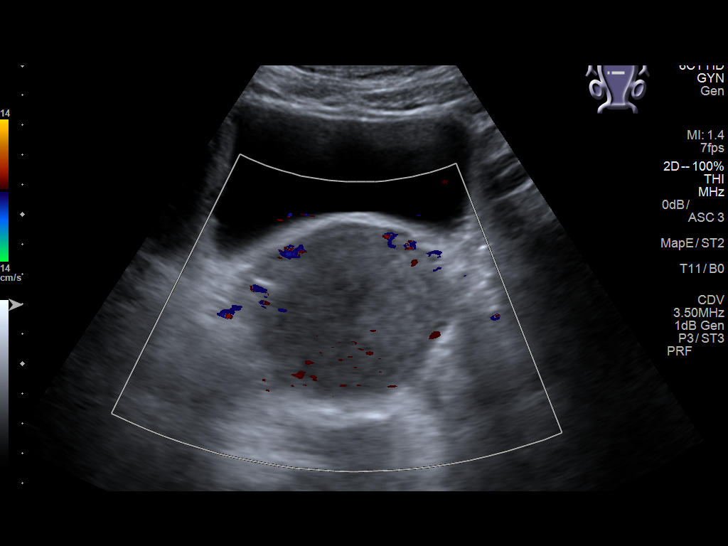
[im 11/64]
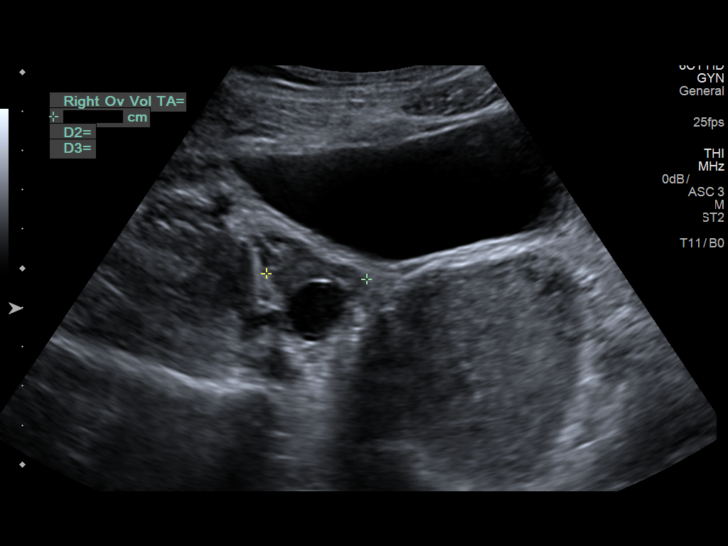
[im 16/64]
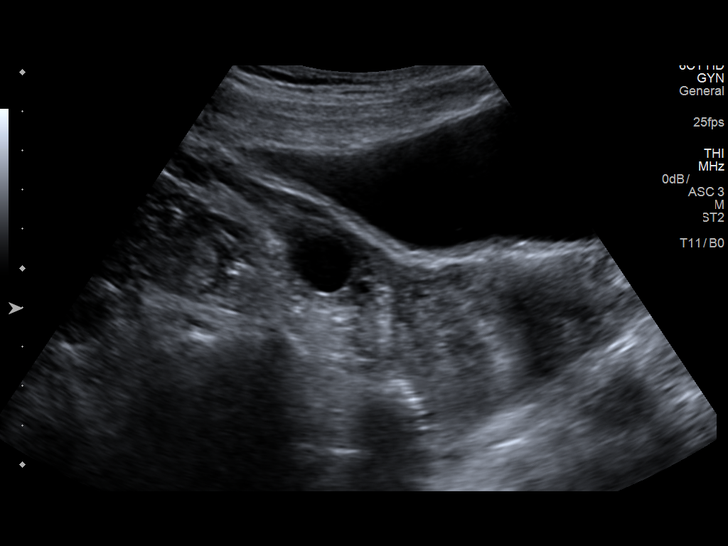
[im 22/64]
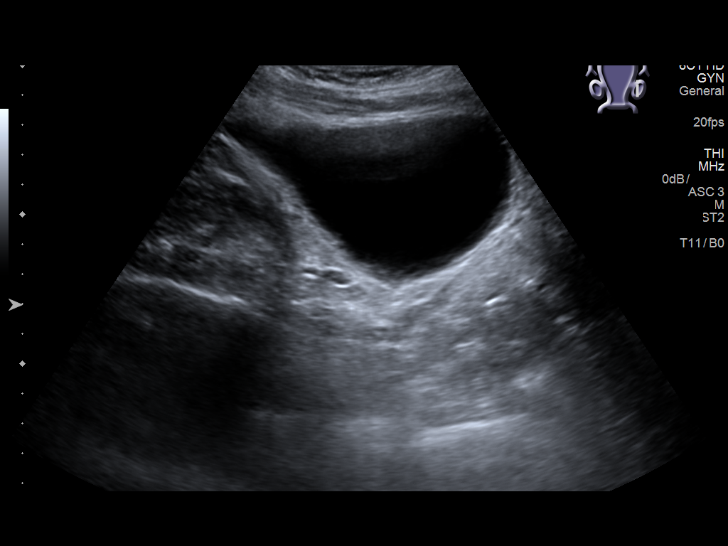
[im 27/64]
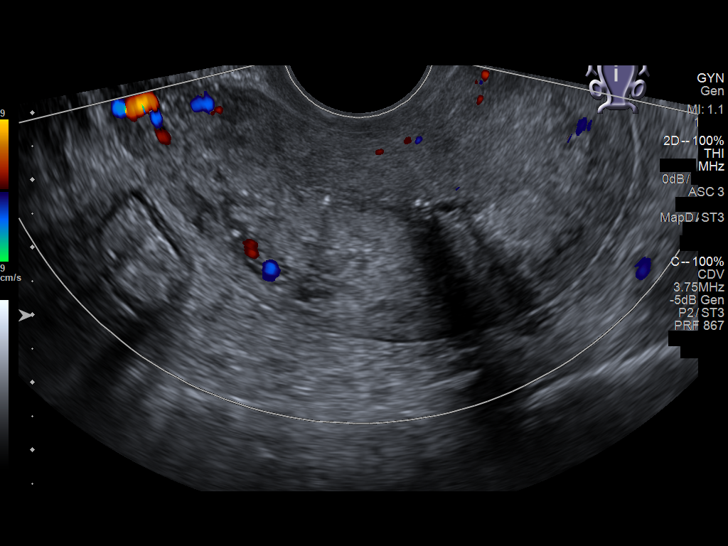
[im 32/64]
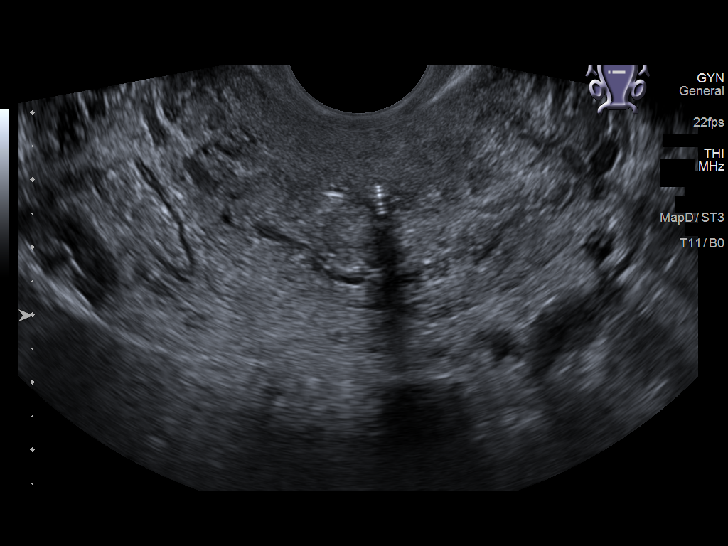
[im 37/64]
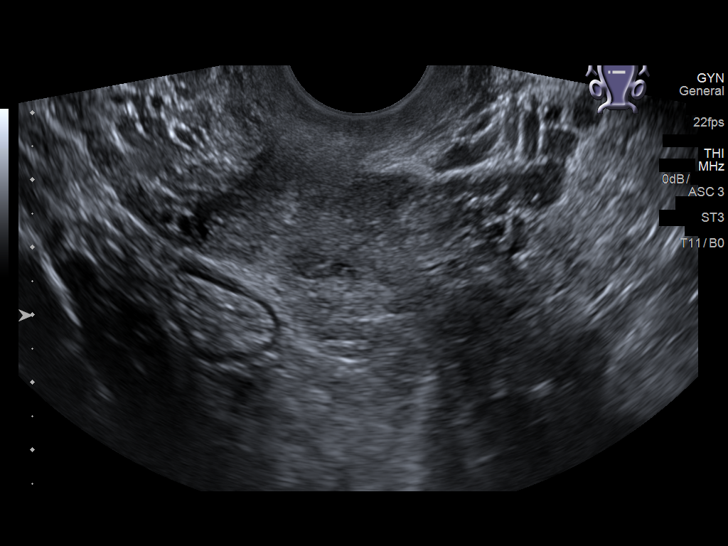
[im 43/64]
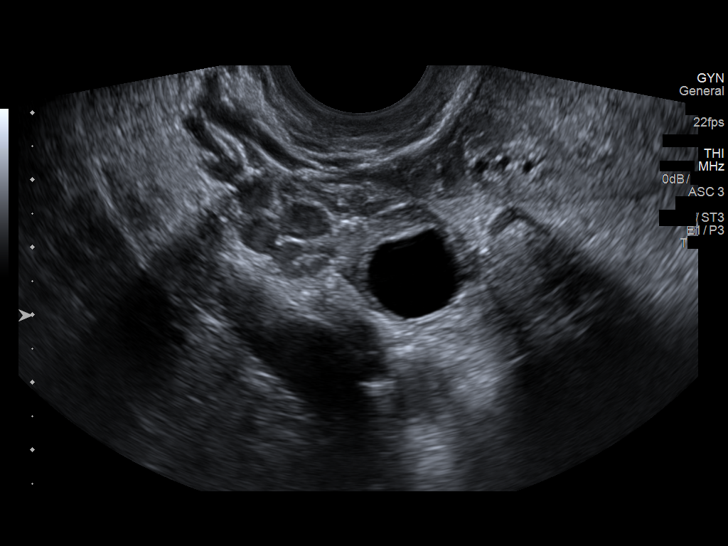
[im 48/64]
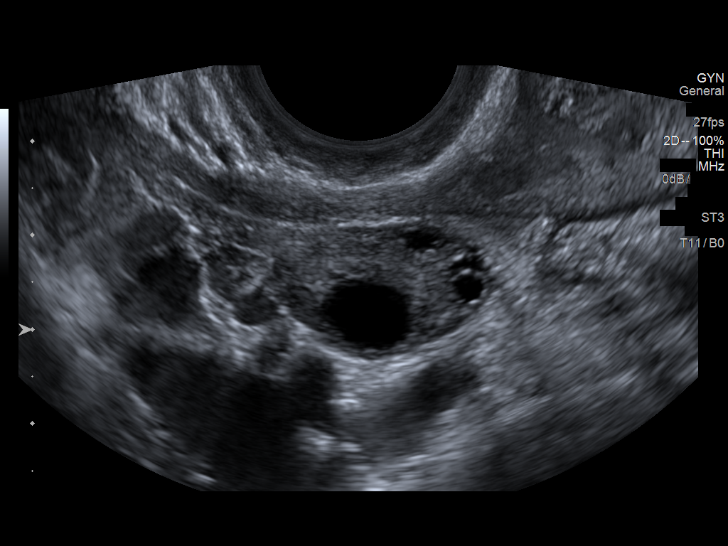
[im 53/64]
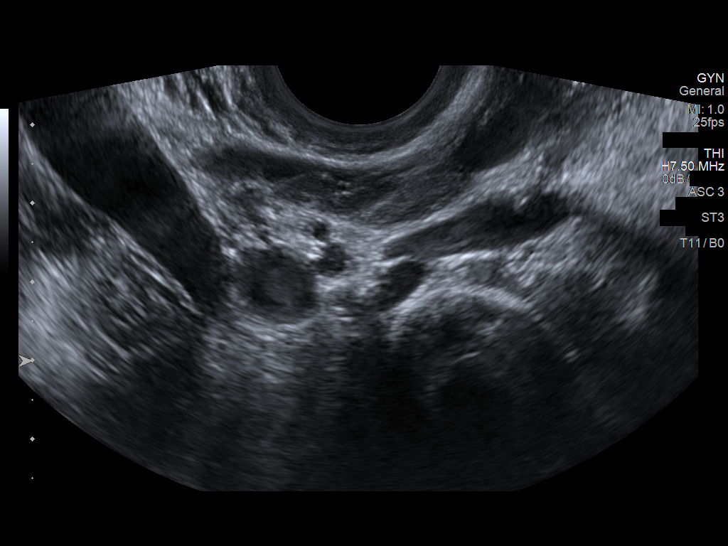
[im 58/64]
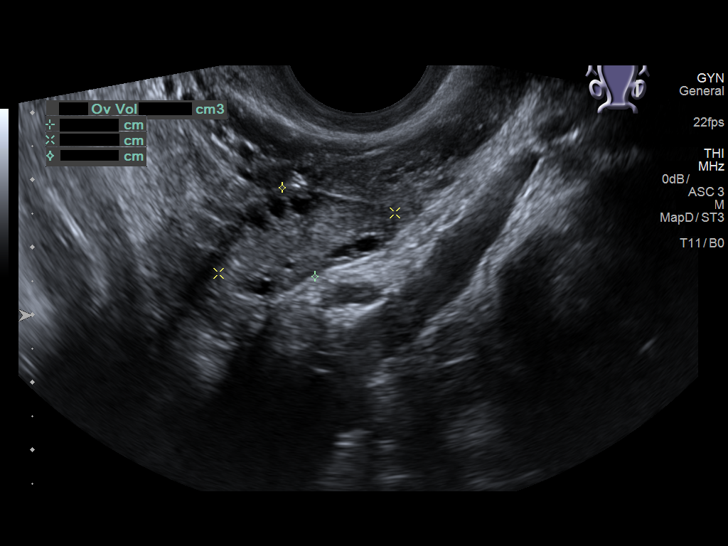
[im 64/64]
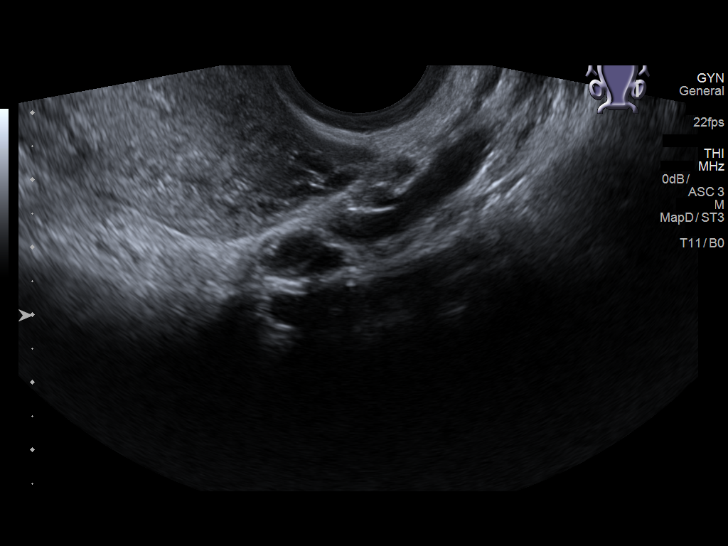

[13 of 25 positions shown; findings below may reference images not displayed]

FINDINGS: Uterus

Measurements: 7.2 x 3.8 x 6.7 cm, retroverted. No fibroids or other
mass visualized.

Endometrium

Thickness: 3 mm in thickness. IUD noted in place in expected
position. No focal abnormality visualized.

Right ovary

Measurements: 2.1 x 2.3 x 1.4 cm. Dominant 1.4 cm follicle.. Normal
appearance/no adnexal mass.

Left ovary

Measurements: 2.8 x 1.4 x 1.9 cm. Normal appearance/no adnexal mass.

Pulsed Doppler evaluation of both ovaries demonstrates normal
low-resistance arterial and venous waveforms.

Other findings

No abnormal free fluid.
IMPRESSION: Unremarkable pelvic ultrasound.  IUD in expected position.

## 2016-10-24 ENCOUNTER — Encounter (HOSPITAL_COMMUNITY): Payer: Self-pay | Admitting: Emergency Medicine

## 2016-10-24 ENCOUNTER — Ambulatory Visit (HOSPITAL_COMMUNITY)
Admission: EM | Admit: 2016-10-24 | Discharge: 2016-10-24 | Disposition: A | Discharge status: Home / Self Care | Payer: Self-pay | Attending: Family Medicine | Admitting: Family Medicine

## 2016-10-24 DIAGNOSIS — R6889 Other general symptoms and signs: Secondary | ICD-10-CM

## 2016-10-24 DIAGNOSIS — R51 Headache: Secondary | ICD-10-CM | POA: Insufficient documentation

## 2016-10-24 DIAGNOSIS — R05 Cough: Secondary | ICD-10-CM | POA: Insufficient documentation

## 2016-10-24 DIAGNOSIS — Z87891 Personal history of nicotine dependence: Secondary | ICD-10-CM | POA: Insufficient documentation

## 2016-10-24 DIAGNOSIS — R6883 Chills (without fever): Secondary | ICD-10-CM | POA: Insufficient documentation

## 2016-10-24 LAB — POCT RAPID STREP A: Streptococcus, Group A Screen (Direct): NEGATIVE

## 2016-10-24 MED ORDER — HYDROCOD POLST-CPM POLST ER 10-8 MG/5ML PO SUER: 5.0000 mL | Freq: Every evening | ORAL | 0 refills | Status: DC | PRN

## 2016-10-24 MED ORDER — FLUTICASONE PROPIONATE 50 MCG/ACT NA SUSP: 2.0000 | Freq: Every day | NASAL | 0 refills | Status: DC

## 2016-10-24 MED ORDER — BENZONATATE 100 MG PO CAPS: 100.0000 mg | ORAL_CAPSULE | Freq: Three times a day (TID) | ORAL | 0 refills | Status: DC

## 2016-10-24 MED ORDER — CETIRIZINE-PSEUDOEPHEDRINE ER 5-120 MG PO TB12: 1.0000 | ORAL_TABLET | Freq: Every day | ORAL | 0 refills | Status: DC

## 2016-10-24 MED ORDER — ONDANSETRON HCL 4 MG PO TABS: 4.0000 mg | ORAL_TABLET | Freq: Four times a day (QID) | ORAL | 0 refills | Status: DC

## 2016-10-24 NOTE — Discharge Instructions (Addendum)
Tessalon for cough and tussionex at night for cough. Start flonase, zyrtec-D for nasal congestion. You can use over the counter nasal saline rinse such as neti pot for nasal congestion. Zofran for nausea and vomiting. Keep hydrated, your urine should be clear to pale yellow in color. Tylenol for fever and pain. Monitor for any worsening of symptoms, chest pain, shortness of breath, wheezing, swelling of the throat, follow up for reevaluation.  If abdominal pain worsens, go to the emergency department for further evaluation.

## 2016-10-24 NOTE — ED Provider Notes (Signed)
[facility]    CSN: [id number] Arrival date & time: 10/24/16  1105     History   Chief Complaint Chief Complaint  Patient presents with  . URI    HPI Leah Shea is a 31 y.o. female.   31 year old female with history of asthma, anxiety, comes in for 2 day history of congestion, cough, migraine, chills. Patient states that she cannot get warm but has chills. She has associated body aches, with chest soreness, exacerbated with cough. 3 episodes of diarrhea and 2 episodes of vomit. She has also had sore throat. She has generalized abdominal pain that is intermittent. Patient with fever with Tmax 102. States she has had some shortness of breath and wheezing, which she used her nebulizer with good relief. She states she has a history of migraine and states that when she has them regularly and is treated through the [place]. She states that when she gets a small headache, it can turn into a migraine, which is what happened this time. She states she is on medications for migraines through the [place], and does not know what it is called. But she has not taken anything for the migraine or for cold symptoms. Positive sick contact.       Past Medical History:  Diagnosis Date  . Anemia   . Anxiety   . Anxiety   . Asthma   . Blood dyscrasia    sickle trait  . Bronchitis, acute   . Depression   . Diverticular disease   . Family history of anesthesia complication    son due sleep apnea  . Headache(784.0)    migraines  . PTSD (post-traumatic stress disorder)   . Shortness of breath    with exertion or panic attack  . Sickle cell anemia (HCC) trait   patient claims asymptomatic  . Sleep apnea    awaiting a sleep study to be done at [facility]    Patient Active Problem List   Diagnosis Date Noted  . Tonsillar hypertrophy 10/07/2013    Past Surgical History:  Procedure Laterality Date  . BREAST SURGERY Bilateral    reductions  . CESAREAN SECTION  2010, [id number]   x 3  . EYE  SURGERY     x 10 as a child  . TONSILLECTOMY N/A 10/07/2013   Procedure: TONSILLECTOMY;  Surgeon: Christia Reading, MD;  Location: [facility] OR;  Service: ENT;  Laterality: N/A;  . TUMOR REMOVAL     tumor removed from back    OB History    No data available       Home Medications    Prior to Admission medications   Medication Sig Start Date End Date Taking? Authorizing Provider  albuterol (PROVENTIL HFA;VENTOLIN HFA) 108 (90 Base) MCG/ACT inhaler Inhale 2 puffs into the lungs every 4 (four) hours as needed for wheezing or shortness of breath. 04/10/15  Yes Cheri Fowler, PA-C  acetaminophen (TYLENOL) 500 MG tablet Take 1 tablet (500 mg total) by mouth every 6 (six) hours as needed. Patient not taking: Reported on 01/26/2016 04/10/15   Cheri Fowler, PA-C  benzonatate (TESSALON) 100 MG capsule Take 1 capsule (100 mg total) by mouth every 8 (eight) hours. 10/24/16   Cathie Hoops, Amy V, PA-C  cetirizine-pseudoephedrine (ZYRTEC-D) 5-120 MG tablet Take 1 tablet by mouth daily. 10/24/16   Cathie Hoops, Amy V, PA-C  chlorpheniramine-HYDROcodone (TUSSIONEX PENNKINETIC ER) 10-8 MG/5ML SUER Take 5 mLs by mouth at bedtime as needed for cough. 10/24/16   Linward Headland  V, PA-C  fluticasone (FLONASE) 50 MCG/ACT nasal spray Place 2 sprays into both nostrils daily. 10/24/16   Cathie HoopsYu, Amy V, PA-C  ondansetron (ZOFRAN) 4 MG tablet Take 1 tablet (4 mg total) by mouth every 6 (six) hours. 10/24/16   Belinda FisherYu, Amy V, PA-C    Family History No family history on file.  Social History Social History  Substance Use Topics  . Smoking status: Former Smoker    Packs/day: 0.25    Years: 1.00    Types: Cigarettes    Quit date: 07/02/2013  . Smokeless tobacco: Never Used  . Alcohol use No     Allergies   Other; Nsaids; and Sulfa antibiotics   Review of Systems Review of Systems  Reason unable to perform ROS: See HPI as above.     Physical Exam Triage Vital Signs ED Triage Vitals  Enc Vitals Group     BP 10/24/16 1123 134/74     Pulse Rate  10/24/16 1123 94     Resp 10/24/16 1123 20     Temp 10/24/16 1123 98.9 F (37.2 C)     Temp Source 10/24/16 1123 Oral     SpO2 10/24/16 1123 99 %     Weight --      Height --      Head Circumference --      Peak Flow --      Pain Score 10/24/16 1120 7     Pain Loc --      Pain Edu? --      Excl. in GC? --    No data found.   Updated Vital Signs BP 134/74 (BP Location: Left Arm)   Pulse 94   Temp 98.9 F (37.2 C) (Oral)   Resp 20   SpO2 99%    Physical Exam  Constitutional: She is oriented to person, place, and time. She appears well-developed and well-nourished. No distress.  HENT:  Head: Normocephalic and atraumatic.  Right Ear: Tympanic membrane, external ear and ear canal normal. Tympanic membrane is not erythematous and not bulging.  Left Ear: Tympanic membrane, external ear and ear canal normal. Tympanic membrane is not erythematous and not bulging.  Nose: Mucosal edema and rhinorrhea present. Right sinus exhibits maxillary sinus tenderness and frontal sinus tenderness. Left sinus exhibits maxillary sinus tenderness and frontal sinus tenderness.  Mouth/Throat: Uvula is midline and mucous membranes are normal. Posterior oropharyngeal erythema present.  Eyes: Pupils are equal, round, and reactive to light. Conjunctivae are normal.  Neck: Normal range of motion. Neck supple.  Cardiovascular: Normal rate, regular rhythm and normal heart sounds.  Exam reveals no gallop and no friction rub.   No murmur heard. Pulmonary/Chest: Effort normal and breath sounds normal. She has no decreased breath sounds. She has no wheezes. She has no rhonchi. She has no rales.  Lymphadenopathy:    She has no cervical adenopathy.  Neurological: She is alert and oriented to person, place, and time.  Skin: Skin is warm and dry.  Psychiatric: She has a normal mood and affect. Her behavior is normal. Judgment normal.     UC Treatments / Results  Labs (all labs ordered are listed, but only  abnormal results are displayed) Labs Reviewed  CULTURE, GROUP A STREP [facility]  POCT RAPID STREP A    EKG  EKG Interpretation None       Radiology No results found.  Procedures Procedures (including critical care time)  Medications Ordered in UC Medications - No data to display  Initial Impression / Assessment and Plan / UC Course  I have reviewed the triage vital signs and the nursing notes.  Pertinent labs & imaging results that were available during my care of the patient were reviewed by me and considered in my medical decision making (see chart for details).    Rapid strep negative. Symptomatic treatment as needed. Return precautions given.    Final Clinical Impressions(s) / UC Diagnoses   Final diagnoses:  Flu-like symptoms    New Prescriptions Discharge Medication List as of 10/24/2016 12:44 PM    START taking these medications   Details  benzonatate (TESSALON) 100 MG capsule Take 1 capsule (100 mg total) by mouth every 8 (eight) hours., Starting Mon 10/24/2016, Normal    cetirizine-pseudoephedrine (ZYRTEC-D) 5-120 MG tablet Take 1 tablet by mouth daily., Starting Mon 10/24/2016, Normal    fluticasone (FLONASE) 50 MCG/ACT nasal spray Place 2 sprays into both nostrils daily., Starting Mon 10/24/2016, Normal           Linward Headland V, PA-C 10/24/16 2106

## 2016-10-24 NOTE — ED Notes (Signed)
Bilateral breath sounds are clear

## 2016-10-24 NOTE — ED Triage Notes (Addendum)
Woke yesterday feeling bad.  Having chills, sinus congestion and headache yesterday.  Woke with chest pain , pain hurts with deep inspiration and coughing.  Patient has diarrhea today-3 episodes today.  Patient reports nausea and vomiting-2 episodes today.    Patient has a child with her that is sniffling and is on antibiotics-unknown diagnosis

## 2016-10-26 ENCOUNTER — Encounter (HOSPITAL_COMMUNITY): Payer: Self-pay

## 2016-10-26 ENCOUNTER — Emergency Department (HOSPITAL_COMMUNITY): Payer: Medicaid Other

## 2016-10-26 ENCOUNTER — Emergency Department (HOSPITAL_COMMUNITY)
Admission: EM | Admit: 2016-10-26 | Discharge: 2016-10-26 | Disposition: A | Discharge status: Home / Self Care | Payer: Medicaid Other | Attending: Emergency Medicine | Admitting: Emergency Medicine

## 2016-10-26 DIAGNOSIS — D573 Sickle-cell trait: Secondary | ICD-10-CM | POA: Insufficient documentation

## 2016-10-26 DIAGNOSIS — Z87891 Personal history of nicotine dependence: Secondary | ICD-10-CM | POA: Insufficient documentation

## 2016-10-26 DIAGNOSIS — R1031 Right lower quadrant pain: Secondary | ICD-10-CM | POA: Diagnosis not present

## 2016-10-26 DIAGNOSIS — J45909 Unspecified asthma, uncomplicated: Secondary | ICD-10-CM | POA: Diagnosis not present

## 2016-10-26 DIAGNOSIS — R197 Diarrhea, unspecified: Secondary | ICD-10-CM

## 2016-10-26 DIAGNOSIS — A599 Trichomoniasis, unspecified: Secondary | ICD-10-CM | POA: Diagnosis not present

## 2016-10-26 DIAGNOSIS — R112 Nausea with vomiting, unspecified: Secondary | ICD-10-CM | POA: Diagnosis present

## 2016-10-26 LAB — CBC
HEMATOCRIT: 37.8 % (ref 36.0–46.0)
HEMOGLOBIN: 12.8 g/dL (ref 12.0–15.0)
MCH: 29.4 pg (ref 26.0–34.0)
MCHC: 33.9 g/dL (ref 30.0–36.0)
MCV: 86.7 fL (ref 78.0–100.0)
Platelets: 239 10*3/uL (ref 150–400)
RBC: 4.36 MIL/uL (ref 3.87–5.11)
RDW: 13.2 % (ref 11.5–15.5)
WBC: 6.7 10*3/uL (ref 4.0–10.5)

## 2016-10-26 LAB — COMPREHENSIVE METABOLIC PANEL
ALBUMIN: 3.8 g/dL (ref 3.5–5.0)
ALT: 12 U/L — ABNORMAL LOW (ref 14–54)
ANION GAP: 9 (ref 5–15)
AST: 13 U/L — ABNORMAL LOW (ref 15–41)
Alkaline Phosphatase: 71 U/L (ref 38–126)
BUN: 12 mg/dL (ref 6–20)
CHLORIDE: 109 mmol/L (ref 101–111)
CO2: 21 mmol/L — AB (ref 22–32)
Calcium: 9.4 mg/dL (ref 8.9–10.3)
Creatinine, Ser: 0.89 mg/dL (ref 0.44–1.00)
GFR calc non Af Amer: >60 mL/min (ref >60)
GLUCOSE: 105 mg/dL — AB (ref 65–99)
POTASSIUM: 3.9 mmol/L (ref 3.5–5.1)
Sodium: 139 mmol/L (ref 135–145)
Total Bilirubin: 0.5 mg/dL (ref 0.3–1.2)
Total Protein: 7.7 g/dL (ref 6.5–8.1)

## 2016-10-26 LAB — CULTURE, GROUP A STREP (THRC)

## 2016-10-26 LAB — LIPASE, BLOOD: LIPASE: 23 U/L (ref 11–51)

## 2016-10-26 LAB — WET PREP, GENITAL
Clue Cells Wet Prep HPF POC: NONE SEEN
Sperm: NONE SEEN
YEAST WET PREP: NONE SEEN

## 2016-10-26 LAB — I-STAT BETA HCG BLOOD, ED (MC, WL, AP ONLY): I-stat hCG, quantitative: <5 m[IU]/mL (ref <5)

## 2016-10-26 MED ORDER — HYDROMORPHONE HCL 1 MG/ML IJ SOLN
0.5000 mg | Freq: Once | INTRAMUSCULAR | Status: AC
  Administered 2016-10-26: 0.5 mg via INTRAVENOUS
  Filled 2016-10-26: qty 1

## 2016-10-26 MED ORDER — HYDROMORPHONE HCL 1 MG/ML IJ SOLN
1.0000 mg | Freq: Once | INTRAMUSCULAR | Status: AC
  Administered 2016-10-26: 1 mg via INTRAVENOUS
  Filled 2016-10-26: qty 1

## 2016-10-26 MED ORDER — METRONIDAZOLE 500 MG PO TABS: 500.0000 mg | ORAL_TABLET | Freq: Two times a day (BID) | ORAL | 0 refills | Status: DC

## 2016-10-26 MED ORDER — HALOPERIDOL LACTATE 5 MG/ML IJ SOLN
2.0000 mg | Freq: Once | INTRAMUSCULAR | Status: AC
  Administered 2016-10-26: 2 mg via INTRAVENOUS
  Filled 2016-10-26: qty 1

## 2016-10-26 MED ORDER — ONDANSETRON 4 MG PO TBDP: 4.0000 mg | ORAL_TABLET | Freq: Three times a day (TID) | ORAL | 0 refills | Status: DC | PRN

## 2016-10-26 NOTE — ED Provider Notes (Signed)
[facility] Provider Note   CSN: [id number] Arrival date & time: 10/26/16  1046  History   Chief Complaint Chief Complaint  Patient presents with  . Influenza   HPI  [name] is a 31yo female with PMH of anxiety, asthma, G6PD deficiency, and ?sickle cell trait who presents with N/V/D and severe RLQ pain that began today. She was seen in urgent care 2 days ago for flu-like symptoms. Given tessalon, zyrtec, and flonase (no antibiotics). She reports she felt better yesterday, but then developed recurrent N/V/D and RLQ pain today. She has not been able to tolerate much PO intake. Has had nonbloody emesis x4 today. Not feeling nauseous now. Multiple nonbloody diarrheal episodes. No sick contacts, no recent travel, no recent changes in diet. Has not tried anything for the abdominal pain. Pain is worse with lying down.  Positive for cough, congestion, chest tightness, shortness of breath, N/V/D, abdominal pain. Negative for dysuria, urinary frequency, urinary urgency, leg swelling.  Denies smoking or alcohol use. Uses marijuana, last used Saturday. States she is not sexually active. Has a mirena. Lives at home with husband and three kids.  Past Medical History:  Diagnosis Date  . Anemia   . Anxiety   . Anxiety   . Asthma   . Blood dyscrasia    sickle trait  . Bronchitis, acute   . Depression   . Diverticular disease   . Family history of anesthesia complication    son due sleep apnea  . Headache(784.0)    migraines  . PTSD (post-traumatic stress disorder)   . Shortness of breath    with exertion or panic attack  . Sickle cell anemia (HCC) trait   patient claims asymptomatic  . Sleep apnea    awaiting a sleep study to be done at [facility]    Patient Active Problem List   Diagnosis Date Noted  . Tonsillar hypertrophy 10/07/2013    Past Surgical History:  Procedure Laterality Date  . BREAST SURGERY Bilateral    reductions  . CESAREAN SECTION  2010, [id number]   x 3  . EYE  SURGERY     x 10 as a child  . TONSILLECTOMY N/A 10/07/2013   Procedure: TONSILLECTOMY;  Surgeon: Christia Reading, MD;  Location: [facility] OR;  Service: ENT;  Laterality: N/A;  . TUMOR REMOVAL     tumor removed from back    OB History    No data available       Home Medications    Prior to Admission medications   Medication Sig Start Date End Date Taking? Authorizing Provider  acetaminophen (TYLENOL) 500 MG tablet Take 1 tablet (500 mg total) by mouth every 6 (six) hours as needed. Patient not taking: Reported on 01/26/2016 04/10/15   Cheri Fowler, PA-C  albuterol (PROVENTIL HFA;VENTOLIN HFA) 108 [phone] Base) MCG/ACT inhaler Inhale 2 puffs into the lungs every 4 (four) hours as needed for wheezing or shortness of breath. 04/10/15   Cheri Fowler, PA-C  benzonatate (TESSALON) 100 MG capsule Take 1 capsule (100 mg total) by mouth every 8 (eight) hours. 10/24/16   Cathie Hoops, Amy V, PA-C  cetirizine-pseudoephedrine (ZYRTEC-D) 5-120 MG tablet Take 1 tablet by mouth daily. 10/24/16   Cathie Hoops, Amy V, PA-C  chlorpheniramine-HYDROcodone (TUSSIONEX PENNKINETIC ER) 10-8 MG/5ML SUER Take 5 mLs by mouth at bedtime as needed for cough. 10/24/16   Cathie Hoops, Amy V, PA-C  fluticasone (FLONASE) 50 MCG/ACT nasal spray Place 2 sprays into both nostrils daily. 10/24/16   Belinda Fisher,  PA-C  ondansetron (ZOFRAN) 4 MG tablet Take 1 tablet (4 mg total) by mouth every 6 (six) hours. 10/24/16   Belinda FisherYu, Amy V, PA-C    Family History No family history on file.  Social History Social History  Substance Use Topics  . Smoking status: Former Smoker    Packs/day: 0.25    Years: 1.00    Types: Cigarettes    Quit date: 07/02/2013  . Smokeless tobacco: Never Used  . Alcohol use No     Allergies   Other; Nsaids; and Sulfa antibiotics   Review of Systems Review of Systems  Unremarkable except as stated above in HPI.  Physical Exam Updated Vital Signs BP 95/68 (BP Location: Right Arm)   Pulse 97   Temp 98.1 F (36.7 C) (Oral)   Resp 20   Ht 5'  6" (1.676 m)   SpO2 99%   Physical Exam GEN: Tearful young female sitting in bed holding her side; has a mask on; appears uncomfortable although speaking in full sentences EYES: Injected conjunctivae RESP: Clear to auscultation bilaterally. No wheezes, rales, or rhonchi. CV: Normal rate and regular rhythm. No murmurs, gallops, or rubs. No LE edema.  ABD: Soft. Severely tender to palpation in suprapubic area and RLQ area. Non-distended. Diminished bowel sounds. Abdomen not hyperresonant. No guarding. EXT: No edema. Warm and well perfused. GU: Some vaginal discharge. Mild cervical motion tenderness. NEURO: Cranial nerves II-XII grossly intact. Able to lift all four extremities against gravity. Speech fluent and appropriate. PSYCH: Patient is very tearful on my interview, stating she has severe abdominal pain.  ED Treatments / Results  Labs (all labs ordered are listed, but only abnormal results are displayed) Labs Reviewed  WET PREP, GENITAL - Abnormal; Notable for the following:       Result Value   Trich, Wet Prep PRESENT (*)    WBC, Wet Prep HPF POC MANY (*)    All other components within normal limits  COMPREHENSIVE METABOLIC PANEL - Abnormal; Notable for the following:    CO2 21 (*)    Glucose, Bld 105 (*)    AST 13 (*)    ALT 12 (*)    All other components within normal limits  LIPASE, BLOOD  CBC  URINALYSIS, ROUTINE W REFLEX MICROSCOPIC  HCG, SERUM, QUALITATIVE  GC/CHLAMYDIA PROBE AMP ([place]) NOT AT [facility]    EKG  EKG Interpretation None       Radiology No results found.  Procedures Procedures (including critical care time)  Medications Ordered in ED Medications  HYDROmorphone (DILAUDID) injection 1 mg (not administered)     Initial Impression / Assessment and Plan / ED Course  I have reviewed the triage vital signs and the nursing notes.  Pertinent labs & imaging results that were available during my care of the patient were reviewed by me and  considered in my medical decision making (see chart for details).  [name] is a 31yo female with PMH of anxiety, asthma, G6PD deficiency, and ?sickle cell trait who presents with N/V/D and severe RLQ pain that started today after being symptomatically treated for flu-like symptoms. Felt better yesterday, but the symptoms returned today. Severe abdominal tenderness on exam. Has had ?sickle cell crises in the past, as well as trichomonas. CBC, CMP, lipase unremarkable and patient is afebrile. HDS. UA ordered. Will give dilaudid for pain control and get her moved into a room. Concern for ovarian torsion vs ruptured ovarian cyst vs PID/TOA vs less likely ectopic. Also less  likely appendicitis or diverticulitis.  4:23pm GC/wet prep ordered. UA and serum hCG pending. Will order pelvic U/S to r/o ovarian torsion.  Patient seen and discussed with Dr. Anitra Lauth. Patient signed out to Dr. Rubin Payor.  Final Clinical Impressions(s) / ED Diagnoses   Final diagnoses:  None    New Prescriptions New Prescriptions   No medications on file     Scherrie Gerlach, MD 10/26/16 1655    Gwyneth Sprout, MD 10/27/16 2127

## 2016-10-26 NOTE — ED Provider Notes (Signed)
  Physical Exam  BP 106/70   Pulse 90   Temp 98.1 F (36.7 C) (Oral)   Resp 18   Ht 5\' 6"  (1.676 m)   SpO2 100%   Physical Exam  ED Course  Procedures  MDM Abdominal pain improved. Ultrasound and lab work reassuring. Feels better after the Haldol although it made her feel weird initially. Has tolerated some orals. Will discharge home. Will also treat for trichomoniasis       Benjiman CorePickering, Elena Davia, MD 10/26/16 2138

## 2016-10-26 NOTE — ED Notes (Signed)
Pt tearful, crying, states pain is 10/10-- "i did not want to bother you because I know you are busy" stressed to patient to use call bell and to make sstaff aware of pain.

## 2016-10-26 NOTE — ED Triage Notes (Signed)
Per Pt, Pt is coming from home with complaints of N/V/D and body aches that started multiple days ago. Pt reports that she was seen at UC two days ago and sent home with medication and reported to have the flu. Denies any antibiotics. Felt better yesterday, but pt has had diarrhea and general body aches that came back significantly today along with right sided abdominal pain.

## 2016-10-26 NOTE — ED Notes (Signed)
Pt to ultrasound,

## 2016-10-27 LAB — GC/CHLAMYDIA PROBE AMP (~~LOC~~) NOT AT ARMC
CHLAMYDIA, DNA PROBE: NEGATIVE
NEISSERIA GONORRHEA: NEGATIVE

## 2017-01-10 ENCOUNTER — Encounter (HOSPITAL_COMMUNITY): Payer: Self-pay | Admitting: Family Medicine

## 2017-01-10 ENCOUNTER — Ambulatory Visit (HOSPITAL_COMMUNITY)
Admission: EM | Admit: 2017-01-10 | Discharge: 2017-01-10 | Disposition: A | Discharge status: Home / Self Care | Payer: Medicaid Other | Attending: Family Medicine | Admitting: Family Medicine

## 2017-01-10 DIAGNOSIS — Z888 Allergy status to other drugs, medicaments and biological substances status: Secondary | ICD-10-CM | POA: Insufficient documentation

## 2017-01-10 DIAGNOSIS — Z87891 Personal history of nicotine dependence: Secondary | ICD-10-CM | POA: Diagnosis not present

## 2017-01-10 DIAGNOSIS — Z882 Allergy status to sulfonamides status: Secondary | ICD-10-CM | POA: Insufficient documentation

## 2017-01-10 DIAGNOSIS — D573 Sickle-cell trait: Secondary | ICD-10-CM | POA: Diagnosis not present

## 2017-01-10 DIAGNOSIS — J029 Acute pharyngitis, unspecified: Secondary | ICD-10-CM | POA: Insufficient documentation

## 2017-01-10 DIAGNOSIS — J45909 Unspecified asthma, uncomplicated: Secondary | ICD-10-CM | POA: Insufficient documentation

## 2017-01-10 DIAGNOSIS — Z91018 Allergy to other foods: Secondary | ICD-10-CM | POA: Insufficient documentation

## 2017-01-10 LAB — POCT RAPID STREP A: Streptococcus, Group A Screen (Direct): NEGATIVE

## 2017-01-10 MED ORDER — AMOXICILLIN 875 MG PO TABS: 875.0000 mg | ORAL_TABLET | Freq: Two times a day (BID) | ORAL | 0 refills | Status: DC

## 2017-01-10 NOTE — ED Provider Notes (Signed)
[facility]   [id number] 01/10/17 Arrival Time: 1000   SUBJECTIVE:  Areyana Thressa ShellerRolle is a 31 y.o. female who presents to the urgent care with complaint of sore throat and tonsillar inflammation for two days.  Some sweats at night. No cough.     Past Medical History:  Diagnosis Date  . Anemia   . Anxiety   . Anxiety   . Asthma   . Blood dyscrasia    sickle trait  . Bronchitis, acute   . Depression   . Diverticular disease   . Family history of anesthesia complication    son due sleep apnea  . Headache(784.0)    migraines  . PTSD (post-traumatic stress disorder)   . Shortness of breath    with exertion or panic attack  . Sickle cell anemia (HCC) trait   patient claims asymptomatic  . Sleep apnea    [facility]   History reviewed. No pertinent family history. Social History   Socioeconomic History  . Marital status: Single    Spouse name: Not on file  . Number of children: Not on file  . Years of education: Not on file  . Highest education level: Not on file  Social Needs  . Financial resource strain: Not on file  . Food insecurity - worry: Not on file  . Food insecurity - inability: Not on file  . Transportation needs - medical: Not on file  . Transportation needs - non-medical: Not on file  Occupational History  . Not on file  Tobacco Use  . Smoking status: Former Smoker    Packs/day: 0.25    Years: 1.00    Pack years: 0.25    Types: Cigarettes    Last attempt to quit: 07/02/2013    Years since quitting: 3.5  . Smokeless tobacco: Never Used  Substance and Sexual Activity  . Alcohol use: No  . Drug use: No  . Sexual activity: Yes    Birth control/protection: IUD  Other Topics Concern  . Not on file  Social History Narrative  . Not on file   No outpatient medications have been marked as taking for the 01/10/17 encounter [facility](Hospital Encounter).   Allergies  Allergen Reactions  . Other Anaphylaxis and Hives   Mushrooms  . Nsaids Hives, Swelling and Other (See Comments)    Body burns   . Sulfa Antibiotics     burns      ROS: As per HPI, remainder of ROS negative.   OBJECTIVE:   Vitals:   01/10/17 1025  BP: 116/77  Pulse: 80  Resp: 18  Temp: 98.6 F (37 C)  SpO2: 99%     General appearance: alert; no distress Eyes: PERRL; EOMI; conjunctiva normal HENT: normocephalic; atraumatic; TMs normal, canal normal, external ears normal without trauma; nasal mucosa normal; oral mucosa shows marked uvular swelling and diffuse posterior pharyngeal erythema. Neck: supple, tender anterior cervical nodes. Lungs: clear to auscultation bilaterally Heart: regular rate and rhythm Abdomen: soft, non-tender; bowel sounds normal; no masses or organomegaly; no guarding or rebound tenderness Back: no CVA tenderness Extremities: no cyanosis or edema; symmetrical with no gross deformities Skin: warm and dry Neurologic: normal gait; grossly normal Psychological: alert and cooperative; normal mood and affect      Labs:  Results for orders placed or performed during the hospital encounter of 01/10/17  POCT rapid strep A [facility])  Result Value Ref Range   Streptococcus, Group A Screen (  Direct) NEGATIVE NEGATIVE    Labs Reviewed  CULTURE, GROUP A STREP [facility])  POCT RAPID STREP A    No results found.     ASSESSMENT & PLAN:  1. Pharyngitis, unspecified etiology     Meds ordered this encounter  Medications  . amoxicillin (AMOXIL) 875 MG tablet    Sig: Take 1 tablet (875 mg total) by mouth 2 (two) times daily.    Dispense:  20 tablet    Refill:  0    Reviewed expectations re: course of current medical issues. Questions answered. Outlined signs and symptoms indicating need for more acute intervention. Patient verbalized understanding. After Visit Summary given.      Elvina SidleLauenstein, Lakelyn Straus, MD 01/10/17 1036

## 2017-01-10 NOTE — Discharge Instructions (Signed)
The initial strep test is negative.  We are running a second test to confirm.  In the meantime, start the antibiotics.  We will call you when the second test is completed and this may take several days.

## 2017-01-10 NOTE — ED Triage Notes (Signed)
Pt here for sore throat and tonsillar inflammation.

## 2017-01-12 LAB — CULTURE, GROUP A STREP (THRC)

## 2017-06-22 ENCOUNTER — Encounter (HOSPITAL_COMMUNITY): Payer: Self-pay | Admitting: Emergency Medicine

## 2017-06-22 ENCOUNTER — Ambulatory Visit (HOSPITAL_COMMUNITY)
Admission: EM | Admit: 2017-06-22 | Discharge: 2017-06-22 | Disposition: A | Discharge status: Home / Self Care | Payer: No Typology Code available for payment source | Attending: Emergency Medicine | Admitting: Emergency Medicine

## 2017-06-22 DIAGNOSIS — R110 Nausea: Secondary | ICD-10-CM | POA: Diagnosis not present

## 2017-06-22 DIAGNOSIS — G43009 Migraine without aura, not intractable, without status migrainosus: Secondary | ICD-10-CM | POA: Diagnosis not present

## 2017-06-22 MED ORDER — DEXAMETHASONE SODIUM PHOSPHATE 10 MG/ML IJ SOLN
10.0000 mg | Freq: Once | INTRAMUSCULAR | Status: AC
  Administered 2017-06-22: 10 mg via INTRAMUSCULAR

## 2017-06-22 MED ORDER — DEXAMETHASONE SODIUM PHOSPHATE 10 MG/ML IJ SOLN
INTRAMUSCULAR | Status: AC
  Filled 2017-06-22: qty 1

## 2017-06-22 MED ORDER — ONDANSETRON 4 MG PO TBDP
ORAL_TABLET | ORAL | Status: AC
  Filled 2017-06-22: qty 1

## 2017-06-22 MED ORDER — METOCLOPRAMIDE HCL 5 MG/ML IJ SOLN
INTRAMUSCULAR | Status: AC
  Filled 2017-06-22: qty 2

## 2017-06-22 MED ORDER — ONDANSETRON 4 MG PO TBDP
4.0000 mg | ORAL_TABLET | Freq: Once | ORAL | Status: AC
  Administered 2017-06-22: 4 mg via ORAL

## 2017-06-22 MED ORDER — ONDANSETRON 4 MG PO TBDP: 4.0000 mg | ORAL_TABLET | Freq: Three times a day (TID) | ORAL | 0 refills | Status: DC | PRN

## 2017-06-22 MED ORDER — METOCLOPRAMIDE HCL 5 MG/ML IJ SOLN
5.0000 mg | Freq: Once | INTRAMUSCULAR | Status: AC
  Administered 2017-06-22: 5 mg via INTRAMUSCULAR

## 2017-06-22 NOTE — ED Triage Notes (Signed)
Pt c/o migraine since last night, has been taking caffeine pills to try and help, woke up with worsening pain.

## 2017-06-22 NOTE — Discharge Instructions (Addendum)
We gave you Decadron and Reglan today to help with your headache.  Please continue to use Tylenol at home as needed for further pain, Zofran for further nausea and vomiting.  Please go to emergency room if developing worsening headache, worsening weakness, changes in vision, headache that is not improving.

## 2017-06-23 NOTE — ED Provider Notes (Signed)
[facility]    CSN: [id number] Arrival date & time: 06/22/17  1754     History   Chief Complaint Chief Complaint  Patient presents with  . Headache    HPI Leah Shea is a 32 y.o. female history of G6PD, sickle cell, asthma presenting today for evaluation of a headache.  Patient states that she had a mild headache yesterday, but turned into a migraine later last night.  She it has been associated with nausea, vomiting, dots and blurring.  She endorses phonophobia and photophobia.  Feels like her voice is going slightly hoarse.  She has taken aspirin, states she cannot tolerate NSAIDs as she gets hives and swelling and because of her G6PD.  States that she is previously been hospitalized for her headaches.  She does endorse some weakness, notes this more on the right side.  Denies chest pain shortness of breath.  HPI  Past Medical History:  Diagnosis Date  . Anemia   . Anxiety   . Anxiety   . Asthma   . Blood dyscrasia    sickle trait  . Bronchitis, acute   . Depression   . Diverticular disease   . Family history of anesthesia complication    son due sleep apnea  . Headache(784.0)    migraines  . PTSD (post-traumatic stress disorder)   . Shortness of breath    with exertion or panic attack  . Sickle cell anemia (HCC) trait   patient claims asymptomatic  . Sleep apnea    awaiting a [facility] to be [facility] at [facility]    Patient Active Problem List   Diagnosis Date Noted  . Tonsillar hypertrophy 10/07/2013    Past Surgical History:  Procedure Laterality Date  . BREAST SURGERY Bilateral    reductions  . CESAREAN SECTION  2010, [id number]   x 3  . EYE SURGERY     x 10 as a child  . TONSILLECTOMY N/A 10/07/2013   Procedure: TONSILLECTOMY;  Surgeon: Christia Reading, MD;  Location: [facility] OR;  Service: ENT;  Laterality: N/A;  . TUMOR REMOVAL     tumor removed from back    OB History   None      Home Medications    Prior to Admission medications   Medication  Sig Start Date End Date Taking? Authorizing Provider  albuterol (PROVENTIL HFA;VENTOLIN HFA) 108 (90 Base) MCG/ACT inhaler Inhale 2 puffs into the lungs every 4 (four) hours as needed for wheezing or shortness of breath. 04/10/15   Cheri Fowler, PA-C  amoxicillin (AMOXIL) 875 MG tablet Take 1 tablet (875 mg total) by mouth 2 (two) times daily. Patient not taking: Reported on 06/22/2017 01/10/17   Elvina Sidle, MD  fluticasone [facility]) 50 MCG/ACT nasal spray Place 2 sprays into both nostrils daily. Patient not taking: Reported on 06/22/2017 10/24/16   Belinda Fisher, PA-C  ondansetron (ZOFRAN ODT) 4 MG disintegrating tablet Take 1 tablet (4 mg total) by mouth every 8 (eight) hours as needed for nausea or vomiting. 06/22/17   Wieters, Junius Creamer, PA-C    Family History No family history on file.  Social History Social History   Tobacco Use  . Smoking status: Former Smoker    Packs/day: 0.25    Years: 1.00    Pack years: 0.25    Types: Cigarettes    Last attempt to quit: 07/02/2013    Years since quitting: 3.9  . Smokeless tobacco: Never Used  Substance Use Topics  . Alcohol use:  No  . Drug use: No     Allergies   Other; Nsaids; and Sulfa antibiotics   Review of Systems Review of Systems  Constitutional: Negative for activity change, appetite change, fatigue and fever.  HENT: Negative for trouble swallowing.   Eyes: Positive for photophobia and visual disturbance. Negative for pain and discharge.  Respiratory: Negative for shortness of breath.   Cardiovascular: Negative for chest pain.  Gastrointestinal: Positive for nausea and vomiting. Negative for abdominal pain, constipation and diarrhea.  Musculoskeletal: Positive for neck pain. Negative for arthralgias, back pain, gait problem, myalgias and neck stiffness.  Skin: Negative for color change and wound.  Neurological: Positive for weakness and headaches. Negative for dizziness, seizures, syncope, light-headedness and numbness.      Physical Exam Triage Vital Signs ED Triage Vitals [06/22/17 1828]  Enc Vitals Group     BP 122/78     Pulse Rate 93     Resp 18     Temp 98.8 F (37.1 C)     Temp src      SpO2 100 %     Weight      Height      Head Circumference      Peak Flow      Pain Score      Pain Loc      Pain Edu?      Excl. in GC?    No data found.  Updated Vital Signs BP 122/78   Pulse 93   Temp 98.8 F (37.1 C)   Resp 18   SpO2 100%   Visual Acuity Right Eye Distance:   Left Eye Distance:   Bilateral Distance:    Right Eye Near:   Left Eye Near:    Bilateral Near:     Physical Exam  Constitutional: She appears well-developed and well-nourished. No distress.  HENT:  Head: Normocephalic and atraumatic.  Eyes: Pupils are equal, round, and reactive to light. Conjunctivae and EOM are normal.  Neck: Neck supple.  Full active range of motion of neck, nontender to palpation  Cardiovascular: Normal rate and regular rhythm.  No murmur heard. Pulmonary/Chest: Effort normal and breath sounds normal. No respiratory distress.  Abdominal: Soft. There is no tenderness.  Musculoskeletal: She exhibits no edema.  Neurological: She is alert.  Patient is alert and oriented, cranial nerves II through XII grossly intact, strength 5/5 and equal bilaterally at shoulders and hips.  Bilateral patellar reflexes 2+.  Normal coordination, no abnormalities with gait.    Skin: Skin is warm and dry.  Psychiatric: She has a normal mood and affect.  Nursing note and vitals reviewed.    UC Treatments / Results  Labs (all labs ordered are listed, but only abnormal results are displayed) Labs Reviewed - No data to display  EKG None  Radiology No results found.  Procedures Procedures (including critical care time)  Medications Ordered in UC Medications  metoCLOPramide (REGLAN) injection 5 mg (5 mg Intramuscular Given 06/22/17 1930)  dexamethasone (DECADRON) injection 10 mg (10 mg Intramuscular  Given 06/22/17 1929)  ondansetron (ZOFRAN-ODT) disintegrating tablet 4 mg (4 mg Oral Given 06/22/17 1930)    Initial Impression / Assessment and Plan / UC Course  I have reviewed the triage vital signs and the nursing notes.  Pertinent labs & imaging results that were available during my care of the patient were reviewed by me and considered in my medical decision making (see chart for details).     Patient with migraine headache,  no focal neuro deficits.  Will avoid Toradol given her allergy to NSAIDs.  Will provide Decadron and Reglan.  As well as Zofran given her nausea and vomiting.  Advised patient to continue to use Tylenol at home, go to emergency room if headache worsening, worsening vision changes, worsening weakness or symptoms not improving. Discussed strict return precautions. Patient verbalized understanding and is agreeable with plan.  Final Clinical Impressions(s) / UC Diagnoses   Final diagnoses:  Migraine without aura and without status migrainosus, not intractable     Discharge Instructions     We gave you Decadron and Reglan today to help with your headache.  Please continue to use Tylenol at home as needed for further pain, Zofran for further nausea and vomiting.  Please go to emergency room if developing worsening headache, worsening weakness, changes in vision, headache that is not improving.   ED Prescriptions    Medication Sig Dispense Auth. Provider   ondansetron (ZOFRAN ODT) 4 MG disintegrating tablet Take 1 tablet (4 mg total) by mouth every 8 (eight) hours as needed for nausea or vomiting. 20 tablet Wieters, [place] C, PA-C     Controlled Substance Prescriptions [place] Controlled Substance Registry consulted? Not Applicable   Lew Dawes, [place] 06/23/17 1610

## 2018-01-10 ENCOUNTER — Telehealth: Payer: Self-pay | Admitting: Licensed Clinical Social Worker

## 2018-01-10 NOTE — Telephone Encounter (Signed)
Lvm for patient appt on 12/30 - [facility] per [facility].

## 2018-02-12 ENCOUNTER — Inpatient Hospital Stay: Payer: No Typology Code available for payment source

## 2018-02-12 ENCOUNTER — Inpatient Hospital Stay
Payer: No Typology Code available for payment source | Attending: Genetic Counselor | Admitting: Licensed Clinical Social Worker

## 2018-02-12 ENCOUNTER — Encounter: Payer: Self-pay | Admitting: Licensed Clinical Social Worker

## 2018-02-12 DIAGNOSIS — Z803 Family history of malignant neoplasm of breast: Secondary | ICD-10-CM | POA: Diagnosis not present

## 2018-02-12 DIAGNOSIS — Z8042 Family history of malignant neoplasm of prostate: Secondary | ICD-10-CM | POA: Diagnosis not present

## 2018-02-12 DIAGNOSIS — Z122 Encounter for screening for malignant neoplasm of respiratory organs: Secondary | ICD-10-CM | POA: Diagnosis not present

## 2018-02-12 NOTE — Progress Notes (Signed)
REFERRING PROVIDER: Carmelia Roller, MD [address] [place], [place] [address]  PRIMARY REASON FOR VISIT:  1. Family history of breast cancer   2. Family history of prostate cancer      HISTORY OF PRESENT ILLNESS:   Leah Shea, a 32 y.o. female, was seen for a [facility] cancer genetics consultation at the request of Leah Shea due to a family history of cancer.  Leah Shea presents to clinic today to discuss the possibility of a hereditary predisposition to cancer, genetic testing, and to further clarify her future cancer risks, as well as potential cancer risks for family members.    Leah Shea is a 32 y.o. female with no personal history of cancer.  Of note, she may have a pituitary tumor. She reports that her prolactin levels have suggested this tumor and she has had several imaging studies but it is unclear whether or not she has a tumor. In addition, she has been having severe breast pain and discharge, and most recently her chest has developed a rash which is itchy.   HORMONAL RISK FACTORS:  Menarche was at age 61.  First live birth at age 37.  OCP use for approximately 2 years.  Ovaries intact: yes.  Hysterectomy: no.  Menopausal status: premenopausal.  HRT use: 0 years. Colonoscopy: yes; normal. Mammogram within the last year: yes. Had breast reduction in 2013.   Past Medical History:  Diagnosis Date  . Anemia   . Anxiety   . Anxiety   . Asthma   . Blood dyscrasia    sickle trait  . Bronchitis, acute   . Depression   . Diverticular disease   . Family history of anesthesia complication    son due sleep apnea  . Family history of breast cancer   . Family history of prostate cancer   . Headache(784.0)    migraines  . PTSD (post-traumatic stress disorder)   . Shortness of breath    with exertion or panic attack  . Sickle cell anemia (HCC) trait   patient claims asymptomatic  . Sleep apnea    awaiting a sleep study to be done at [facility]    Past Surgical History:    Procedure Laterality Date  . BREAST SURGERY Bilateral    reductions  . CESAREAN SECTION  2010, [id number]   x 3  . EYE SURGERY     x 10 as a child  . TONSILLECTOMY N/A 10/07/2013   Procedure: TONSILLECTOMY;  Surgeon: Leah Quitter, MD;  Location: [facility];  Service: ENT;  Laterality: N/A;  . 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 Marital status: Single    Spouse name: Not on file  . Number of children: Not on file  . Years of education: Not on file  . Highest education level: Not on file  Occupational History  . Not on file  Social Needs  . Financial resource strain: Not on file  . Food insecurity:    Worry: Not on file    Inability: Not on file  . Transportation needs:    Medical: Not on file    Non-medical: Not on file  Tobacco Use  . Smoking status: Former Smoker    Packs/day: 0.25    Years: 1.00    Pack years: 0.25    Types: Cigarettes    Last attempt to quit: 07/02/2013    Years since quitting: 4.6  . Smokeless  tobacco: Never Used  Substance and Sexual Activity  . Alcohol use: No  . Drug use: No  . Sexual activity: Yes    Birth control/protection: I.U.D.  Lifestyle  . Physical activity:    Days per week: Not on file    Minutes per session: Not on file  . Stress: Not on file  Relationships  . Social connections:    Talks on phone: Not on file    Gets together: Not on file    Attends religious service: Not on file    Active member of club or organization: Not on file    Attends meetings of clubs or organizations: Not on file    Relationship status: Not on file  Other Topics Concern  . Not on file  Social History Narrative  . Not on file     FAMILY HISTORY:  We obtained a detailed, 4-generation family history.  Significant diagnoses are listed below: Family History  Problem Relation Age of Onset  . Breast cancer Mother   . Breast cancer Maternal Aunt 36       metastatic  . Cancer Maternal Uncle         unk type  . Breast cancer Maternal Grandmother   . Prostate cancer Maternal Grandfather        metastatic  . Cancer Maternal Aunt        either breast or ovarian     Leah Shea has four children: 3 sons, ages 21, 56 and 71 and a daughter age 71. No childhood cancers. She has 6 half brothers, 2 half sisters all through her mom. One of her half brothers had a noncancerous brain tumor.   Leah Shea's mother had breast cancer, but she does not know the age. Leah Shea is not in communication with her mother and therefore has limited information about this side of the family. She knows she had a maternal aunt with breast cancer diagnosed at 1 and is in her 28s currently. Another aunt had either breast or ovarian cancer and had bilateral mastectomies and ovaries removed. An uncle had cancer, unknown type. Her mother had 71 total brothers and sisters. As far as she knows, no cancers in her maternal cousins. Her maternal grandmother had breast cancer and died in her 21s. Her maternal grandfather had metastatic prostate cancer and died at 50.   Leah Shea does not have any information about her father or his side of the family.   Leah Shea is unaware of previous family history of genetic testing for hereditary cancer risks. Patient's maternal ancestors are of [place] descent, and paternal ancestors are of [place] descent. There is no reported Ashkenazi Jewish ancestry. There is no known consanguinity.  GENETIC COUNSELING ASSESSMENT: Leah Shea is a 32 y.o. female with a family history which is somewhat suggestive of a Hereditary Cancer Predisposition Syndrome. We, therefore, discussed and recommended the following at today's visit.   DISCUSSION: We discussed that about 5-10% of breast cancer cases are hereditary with most cases due to BRCA mutations.  Other genes associated with hereditary breast cancer cases include ATM, CHEK2 and PALB2.  We reviewed the characteristics, features and inheritance patterns of  hereditary cancer syndromes. We also discussed genetic testing, including the appropriate family members to test, the process of testing, insurance coverage and turn-around-time for results. We discussed the implications of a negative, positive and/or variant of uncertain significant result. We recommended Leah Shea pursue genetic testing for the [facility] Multi-Cancer Panel.   The Multi-Cancer  Panel offered by [facility] includes sequencing and/or deletion duplication testing of the following 84 genes: AIP, ALK, APC, ATM, AXIN2,BAP1,  BARD1, BLM, BMPR1A, BRCA1, BRCA2, BRIP1, CASR, CDC73, CDH1, CDK4, CDKN1B, CDKN1C, CDKN2A (p14ARF), CDKN2A (p16INK4a), CEBPA, CHEK2, CTNNA1, DICER1, DIS3L2, EGFR (c.2369C>T, p.Thr790Met variant only), EPCAM (Deletion/duplication testing only), FH, FLCN, GATA2, GPC3, GREM1 (Promoter region deletion/duplication testing only), HOXB13 (c.251G>A, p.Gly84Glu), HRAS, KIT, MAX, MEN1, MET, MITF (c.952G>A, p.Glu318Lys variant only), MLH1, MSH2, MSH3, MSH6, MUTYH, NBN, NF1, NF2, NTHL1, PALB2, PDGFRA, PHOX2B, PMS2, POLD1, POLE, POT1, PRKAR1A, PTCH1, PTEN, RAD50, RAD51C, RAD51D, RB1, RECQL4, RET, RUNX1, SDHAF2, SDHA (sequence changes only), SDHB, SDHC, SDHD, SMAD4, SMARCA4, SMARCB1, SMARCE1, STK11, SUFU, TERC, TERT, TMEM127, TP53, TSC1, TSC2, VHL, WRN and WT1.   We discussed that if she is found to have a mutation in one of these genes, it may impact future medical management recommendations such as increased cancer screenings and consideration of risk reducing surgeries.  A positive result could also have implications for the patient's family members.  A Negative result would mean we were unable to identify a hereditary component to her family history of cancer but does not rule out the possibility of a hereditary basis for her family history of cancer.  There could be mutations that are undetectable by current technology, or in genes not yet tested or identified to increase cancer risk.    We  discussed the potential to find a Variant of Uncertain Significance or VUS.  These are variants that have not yet been identified as pathogenic or benign, and it is unknown if this variant is associated with increased cancer risk or if this is a normal finding.  Most VUS's are reclassified to benign or likely benign.   It should not be used to make medical management decisions. With time, we suspect the lab will determine the significance of any VUS's identified if any.   Based on Leah Shea's family history of cancer, she meets NCCN medical criteria for genetic testing. Despite that she meets criteria, she may still have an out of pocket cost. The lab will notify her of an OOP if any.  Based on the patient's personal and family history, the statistical model Harriett Shea was used to estimate her risk of developing breast cancer. This estimates her lifetime risk of developing breast cancer to be approximately 15.2%. This estimation is in the setting of negative test results and may change if she has a positive genetic test result.  The patient's lifetime breast cancer risk is a preliminary estimate based on available information using one of several models endorsed by the [place] (ACS). The ACS recommends consideration of breast MRI screening as an adjunct to mammography for patients at high risk (defined as 20% or greater lifetime risk). A more detailed breast cancer risk assessment can be considered, if clinically indicated.     PLAN: After considering the risks, benefits, and limitations, Leah Shea  provided informed consent to pursue genetic testing and the blood sample was sent to [facility] for analysis of the Multi-Cancer Panel. Results should be available within approximately 2-3 weeks' time, at which point they will be disclosed by telephone to Leah Shea, as will any additional recommendations warranted by these results. Leah Shea will receive a summary of her genetic  counseling visit and a copy of her results once available. This information will also be available in Epic.  Based on Leah Shea's family history, we recommended her maternal relatives have genetic counseling and testing. Ms.  Shea will let us know if we can be of any assistance in coordinating genetic counseling and/or testing for this family member.   Lastly, we encouraged Ms. Lindner to remain in contact with cancer genetics annually so that we can continuously update the family history and inform her of any changes in cancer genetics and testing that may be of benefit for this family.   Ms.  Gebhard's questions were answered to her satisfaction today. Our contact information was provided should additional questions or concerns arise. Thank you for the referral and allowing [place] to share in the care of your patient.   Faith Rogue, MS Genetic Counselor [email or web address] Phone: [phone]  The patient was seen for a total of 35 minutes in face-to-face genetic counseling.

## 2018-02-14 DIAGNOSIS — G40209 Localization-related (focal) (partial) symptomatic epilepsy and epileptic syndromes with complex partial seizures, not intractable, without status epilepticus: Secondary | ICD-10-CM | POA: Insufficient documentation

## 2018-02-14 HISTORY — PX: PILONIDAL CYST EXCISION: SHX744

## 2018-02-14 HISTORY — DX: Localization-related (focal) (partial) symptomatic epilepsy and epileptic syndromes with complex partial seizures, not intractable, without status epilepticus: G40.209

## 2018-02-23 ENCOUNTER — Encounter: Payer: Self-pay | Admitting: Licensed Clinical Social Worker

## 2018-02-23 ENCOUNTER — Ambulatory Visit: Payer: Self-pay | Admitting: Licensed Clinical Social Worker

## 2018-02-23 ENCOUNTER — Telehealth: Payer: Self-pay | Admitting: Licensed Clinical Social Worker

## 2018-02-23 DIAGNOSIS — Z1379 Encounter for other screening for genetic and chromosomal anomalies: Secondary | ICD-10-CM | POA: Insufficient documentation

## 2018-02-23 NOTE — Progress Notes (Signed)
HPI:  Ms. Kubota was previously seen in the [facility] clinic on 02/12/2018 due to a family history of cancer and concerns regarding a hereditary predisposition to cancer. Please refer to our prior cancer genetics clinic note for more information regarding Ms. Elden's medical, social and family histories, and our assessment and recommendations, at the time. Ms. Nicolaisen recent genetic test results were disclosed to her, as well as recommendations warranted by these results. These results and recommendations are discussed in more detail below.   FAMILY HISTORY:  We obtained a detailed, 4-generation family history.  Significant diagnoses are listed below: Family History  Problem Relation Age of Onset  . Breast cancer Mother   . Breast cancer Maternal Aunt 36       metastatic  . Cancer Maternal Uncle        unk type  . Breast cancer Maternal Grandmother   . Prostate cancer Maternal Grandfather        metastatic  . Cancer Maternal Aunt        either breast or ovarian    Ms. Urschel has four children: 3 sons, ages 21, 19 and 84 and a daughter age 66. No childhood cancers. She has 6 half brothers, 2 half sisters all through her mom. One of her half brothers had a noncancerous brain tumor.   Ms. Cercone's mother had breast cancer, but she does not know the age. Ms. Follmer is not in communication with her mother and therefore has limited information about this side of the family. She knows she had a maternal aunt with breast cancer diagnosed at 33 and is in her 42s currently. Another aunt had either breast or ovarian cancer and had bilateral mastectomies and ovaries removed. An uncle had cancer, unknown type. Her mother had 4 total brothers and sisters. As far as she knows, no cancers in her maternal cousins. Her maternal grandmother had breast cancer and died in her 11s. Her maternal grandfather had metastatic prostate cancer and died at 18.   Ms. Jehle does not have any information about her  father or his side of the family.   Ms. Currie is unaware of previous family history of genetic testing for hereditary cancer risks. Patient's maternal ancestors are of [place] descent, and paternal ancestors are of [place] descent. There is no reported Ashkenazi Jewish ancestry. There is no known consanguinity.  GENETIC TEST RESULTS: Genetic testing performed through [facility]'s Multi-Cancer Panel reported out on 02/23/2018 showed no pathogenic mutations. The Multi-Cancer Panel offered by [facility] includes sequencing and/or deletion duplication testing of the following 84 genes: AIP, ALK, APC, ATM, AXIN2,BAP1,  BARD1, BLM, BMPR1A, BRCA1, BRCA2, BRIP1, CASR, CDC73, CDH1, CDK4, CDKN1B, CDKN1C, CDKN2A (p14ARF), CDKN2A (p16INK4a), CEBPA, CHEK2, CTNNA1, DICER1, DIS3L2, EGFR (c.2369C>T, p.Thr790Met variant only), EPCAM (Deletion/duplication testing only), FH, FLCN, GATA2, GPC3, GREM1 (Promoter region deletion/duplication testing only), HOXB13 (c.251G>A, p.Gly84Glu), HRAS, KIT, MAX, MEN1, MET, MITF (c.952G>A, p.Glu318Lys variant only), MLH1, MSH2, MSH3, MSH6, MUTYH, NBN, NF1, NF2, NTHL1, PALB2, PDGFRA, PHOX2B, PMS2, POLD1, POLE, POT1, PRKAR1A, PTCH1, PTEN, RAD50, RAD51C, RAD51D, RB1, RECQL4, RET, RUNX1, SDHAF2, SDHA (sequence changes only), SDHB, SDHC, SDHD, SMAD4, SMARCA4, SMARCB1, SMARCE1, STK11, SUFU, TERC, TERT, TMEM127, TP53, TSC1, TSC2, VHL, WRN and WT1.  A variant of uncertain significance (VUS) in a gene called BRIP1 was also noted.  A variant of uncertain significance (VUS) in a gene called KIT was also noted.  The test report will be scanned into EPIC and will be located under the Molecular Pathology section  of the Results Review tab. A portion of the result report is included below for reference.     We discussed with Ms. Wooden that because current genetic testing is not perfect, it is possible there may be a gene mutation in one of these genes that current testing cannot detect, but that chance is  small.  We also discussed, that there could be another gene that has not yet been discovered, or that we have not yet tested, that is responsible for the cancer diagnoses in the family. It is also possible there is a hereditary cause for the cancer in the family that Ms. Blacksher did not inherit and therefore was not identified in her testing.  Therefore, it is important to remain in touch with cancer genetics in the future so that we can continue to offer Ms. Hostetter the most up to date genetic testing.   Regarding the VUS's in BRIP1 and KIT: At this time, it is unknown if these variants are associated with increased cancer risk or if they are normal findings, but most variants such as these get reclassified to being inconsequential. They should not be used to make medical management decisions. With time, we suspect the lab will determine the significance of these variants, if any. If we do learn more about them, we will try to contact Ms. Maroney to discuss it further. However, it is important to stay in touch with [place] periodically and keep the address and phone number up to date.  ADDITIONAL GENETIC TESTING: We discussed with Ms. Byington that her genetic testing was fairly extensive.  If there are are genes identified to increase cancer risk that can be analyzed in the future, we would be happy to discuss and coordinate this testing at that time.    CANCER SCREENING RECOMMENDATIONS: Ms. Ayyad's test result is considered negative (normal).  This means that we have not identified a hereditary cause for her family history of cancer at this time.   This normal indicates that it is unlikely Ms. Methot has an increased risk of cancer due to a mutation in one of these genes. While reassuring, this does not definitively rule out a hereditary predisposition to cancer. It is still possible that there could be genetic mutations that are undetectable by current technology, or genetic mutations in genes that have not been tested  or identified to increase cancer risk.  Therefore, it is recommended she continue to follow the cancer management and screening guidelines provided by healthcare providers. An individual's cancer risk is not determined by genetic test results alone.  Overall cancer risk assessment includes additional factors such as personal medical history, family history, etc.  These should be used to make a personalized plan for cancer prevention and surveillance.    Based on the patient's personal and family history,the risk model Harriett Rush was used to estimate her risk of developing breast cancer. This estimates her lifetime risk of developing breast cancer to be approximately 15.2%. This estimationis in the setting of negative test results and may change if she has a positive genetic test result.  The patient's lifetime breast cancer risk is a preliminary estimate based on available information using one of several models endorsed by the [place] (ACS). The ACS recommends consideration of breast MRI screening as an adjunct to mammography for patients at high risk (defined as 20% or greater lifetime risk). A more detailed breast cancer risk assessment can be considered, if clinically indicated.  RECOMMENDATIONS FOR FAMILY MEMBERS:  Relatives in this family might be at some increased risk of developing cancer, over the general population risk, simply due to the family history of cancer.  We recommended women in this family have a yearly mammogram beginning at age 88, or 45 years younger than the earliest onset of cancer, an annual clinical breast exam, and perform monthly breast self-exams. Women in this family should also have a gynecological exam as recommended by their primary provider. All family members should have a colonoscopy as directed by their doctors.  All family members should inform their physicians about the family history of cancer so their doctors can make the most appropriate  screening recommendations for them.   It is also possible there is a hereditary cause for the cancer in Ms. Granholm's family that she did not inherit and therefore was not identified in her.  We recommended her maternal relatives have genetic counseling and testing. Ms. Reindel will let us know if we can be of any assistance in coordinating genetic counseling and/or testing for these family members.   FOLLOW-UP: Lastly, we discussed with Ms. Ardila that cancer genetics is a rapidly advancing field and it is possible that new genetic tests will be appropriate for her and/or her family members in the future. We encouraged her to remain in contact with cancer genetics on an annual basis so we can update her personal and family histories and let her know of advances in cancer genetics that may benefit this family.   Our contact number was provided. Ms. Gallo's questions were answered to her satisfaction, and she knows she is welcome to call us at anytime with additional questions or concerns.  Faith Rogue, MS Genetic Counselor [email or web address] Phone: [phone]

## 2018-02-23 NOTE — Telephone Encounter (Signed)
Revealed negative genetic testing.  Revealed that 2 VUS's were identified, one in one of her BRIP1 genes and another in one of her KIT genes. This normal result is reassuring.  It is unlikely that there is an increased risk of cancer due to a mutation in one of these genes.  However, genetic testing is not perfect, and cannot definitively rule out a hereditary cause.  It will be important for her to keep in contact with genetics to learn if any additional testing may be needed in the future.

## 2018-03-21 ENCOUNTER — Telehealth: Payer: Self-pay | Admitting: Licensed Clinical Social Worker

## 2018-03-27 NOTE — Telephone Encounter (Signed)
Received message from the genetic testing lab, [facility], asking for an updated insurance card. The patient believes we already have what is most up to date, but will call back with a card number to confirm.

## 2018-05-14 ENCOUNTER — Telehealth: Payer: Self-pay | Admitting: *Deleted

## 2018-05-14 NOTE — Telephone Encounter (Signed)
Medical records faxed to [facility]) [facility]; [place] [id number]

## 2018-08-10 ENCOUNTER — Ambulatory Visit (HOSPITAL_COMMUNITY)
Admission: EM | Admit: 2018-08-10 | Discharge: 2018-08-10 | Disposition: A | Discharge status: Home / Self Care | Payer: No Typology Code available for payment source | Attending: Internal Medicine | Admitting: Internal Medicine

## 2018-08-10 ENCOUNTER — Other Ambulatory Visit: Payer: Self-pay

## 2018-08-10 ENCOUNTER — Encounter (HOSPITAL_COMMUNITY): Payer: Self-pay

## 2018-08-10 DIAGNOSIS — L0501 Pilonidal cyst with abscess: Secondary | ICD-10-CM | POA: Diagnosis not present

## 2018-08-10 MED ORDER — ACETAMINOPHEN 325 MG PO TABS
975.0000 mg | ORAL_TABLET | Freq: Once | ORAL | Status: AC
  Administered 2018-08-10: 975 mg via ORAL

## 2018-08-10 MED ORDER — ACETAMINOPHEN 325 MG PO TABS
ORAL_TABLET | ORAL | Status: AC
  Filled 2018-08-10: qty 3

## 2018-08-10 MED ORDER — DOXYCYCLINE HYCLATE 100 MG PO CAPS: 100.0000 mg | ORAL_CAPSULE | Freq: Two times a day (BID) | ORAL | 0 refills | Status: DC

## 2018-08-10 MED ORDER — TRAMADOL HCL 50 MG PO TABS: 50.0000 mg | ORAL_TABLET | Freq: Three times a day (TID) | ORAL | 0 refills | Status: DC | PRN

## 2018-08-10 NOTE — ED Triage Notes (Signed)
Patient presents to Urgent Care with complaints of lower back pain near her tailbone since almost a week ago. Patient reports she can feel a knot at the base of her back. Swelling noted, skin WDL, pt endorses new pain that radiates up to her left shoulder from her low back.

## 2018-08-10 NOTE — Discharge Instructions (Signed)
Warm soaks/ compresses to promote further drainage.  Tylenol as needed for pain. Tramadol for breakthrough pain. May cause drowsiness. Please do not take if driving or drinking alcohol.  May cause constipation. Increase fluid intake.  Complete course of antibiotics.  May need to follow up with general surgery if recurrent for removal.  If worsening over the next few days may return for incision and drainage.

## 2018-08-10 NOTE — ED Provider Notes (Signed)
[facility]    CSN: [id number] Arrival date & time: 08/10/18  1720      History   Chief Complaint Chief Complaint  Patient presents with  . Back Pain    HPI Leah Shea is a 33 y.o. female.   Leah Shea presents with complaints of pain to her tailbone region which started approximately 6 days ago and has progressed in size and pain. Has tried ice and epsom salt which haven't helped. No fevers. Pain radiates up back and even to her left shoulder. No injury to the area. Denies any previous similar. No bowel movement in the past week due to pain. Feels a pressure sensation. No loss of bladder or bowel function. No saddle paresthesia. Hx of anxiety, anemia, sickle cell trait, migraines.     ROS per HPI, negative if not otherwise mentioned.      Past Medical History:  Diagnosis Date  . Anemia   . Anxiety   . Anxiety   . Asthma   . Blood dyscrasia    sickle trait  . Bronchitis, acute   . Depression   . Diverticular disease   . Family history of anesthesia complication    son due sleep apnea  . Family history of breast cancer   . Family history of prostate cancer   . Headache(784.0)    migraines  . PTSD (post-traumatic stress disorder)   . Shortness of breath    with exertion or panic attack  . Sickle cell anemia (HCC) trait   patient claims asymptomatic  . Sleep apnea    [facility]    Patient Active Problem List   Diagnosis Date Noted  . Genetic testing 02/23/2018  . Family history of breast cancer   . Family history of prostate cancer   . Tonsillar hypertrophy 10/07/2013    Past Surgical History:  Procedure Laterality Date  . BREAST SURGERY Bilateral    reductions  . CESAREAN SECTION  2010, [id number]   x 3  . EYE SURGERY     x 10 as a child  . TONSILLECTOMY N/A 10/07/2013   Procedure: TONSILLECTOMY;  Surgeon: Christia Readingwight Bates, MD;  [facility];  Service: ENT;  Laterality: N/A;  . TUMOR REMOVAL     tumor  removed from back    OB History   No obstetric history on file.      Home Medications    Prior to Admission medications   Medication Sig Start Date End Date Taking? Authorizing Provider  albuterol (PROVENTIL HFA;VENTOLIN HFA) 108 (90 Base) MCG/ACT inhaler Inhale 2 puffs into the lungs every 4 (four) hours as needed for wheezing or shortness of breath. 04/10/15   Cheri Fowlerose, Kayla, PA-C  doxycycline (VIBRAMYCIN) 100 MG capsule Take 1 capsule (100 mg total) by mouth 2 (two) times daily. 08/10/18   Georgetta HaberBurky, Kidada Ging B, NP  traMADol (ULTRAM) 50 MG tablet Take 1 tablet (50 mg total) by mouth every 8 (eight) hours as needed. 08/10/18   Georgetta HaberBurky, Brennen Gardiner B, NP  fluticasone (FLONASE) 50 MCG/ACT nasal spray Place 2 sprays into both nostrils daily. Patient not taking: Reported on 06/22/2017 10/24/16 08/10/18  Belinda FisherYu, Amy V, PA-C    Family History Family History  Problem Relation Age of Onset  . Breast cancer Mother   . Breast cancer Maternal Aunt 36       metastatic  . Cancer Maternal Uncle        unk type  . Breast cancer  Maternal Grandmother   . Prostate cancer Maternal Grandfather        metastatic  . Cancer Maternal Aunt        either breast or ovarian     Social History Social History   Tobacco Use  . Smoking status: Former Smoker    Packs/day: 0.25    Years: 1.00    Pack years: 0.25    Types: Cigarettes    Quit date: 07/02/2013    Years since quitting: 5.1  . Smokeless tobacco: Never Used  Substance Use Topics  . Alcohol use: No  . Drug use: No     Allergies   Other, Nsaids, and Sulfa antibiotics   Review of Systems Review of Systems   Physical Exam Triage Vital Signs ED Triage Vitals  Enc Vitals Group     BP 08/10/18 1744 (!) 144/104     Pulse Rate 08/10/18 1744 84     Resp 08/10/18 1744 18     Temp 08/10/18 1744 98.7 F (37.1 C)     Temp Source 08/10/18 1744 Oral     SpO2 08/10/18 1744 100 %     Weight --      Height --      Head Circumference --      Peak Flow --       Pain Score 08/10/18 1740 8     Pain Loc --      Pain Edu? --      Excl. in [place]? --    No data found.  Updated Vital Signs BP (!) 144/104 (BP Location: Right Arm)   Pulse 84   Temp 98.7 F (37.1 C) (Oral)   Resp 18   SpO2 100%      Physical Exam Constitutional:      General: She is not in acute distress.    Appearance: She is well-developed.  Cardiovascular:     Rate and Rhythm: Normal rate.  Pulmonary:     Effort: Pulmonary effort is normal.  Skin:    General: Skin is warm and dry.          Comments: Draining abscess to gluteal cleft with tenderness and surrounding redness; purulent drainage   Neurological:     Mental Status: She is alert and oriented to person, place, and time.      UC Treatments / Results  Labs (all labs ordered are listed, but only abnormal results are displayed) Labs Reviewed - No data to display  EKG None  Radiology No results found.  Procedures Procedures (including critical care time)  Medications Ordered in UC Medications  acetaminophen (TYLENOL) tablet 975 mg (975 mg Oral Given 08/10/18 1836)  acetaminophen (TYLENOL) 325 MG tablet (has no administration in time range)    Initial Impression / Assessment and Plan / UC Course  I have reviewed the triage vital signs and the nursing notes.  Pertinent labs & imaging results that were available during my care of the patient were reviewed by me and considered in my medical decision making (see chart for details).     Draining pilonidal abscess. Patient declines further incision and drainage, discussed that this could better aid in healing. Antibiotics and pain management discussed. Follow up with general surgery prn. Return precautions provided. Patient verbalized understanding and agreeable to plan.   Final Clinical Impressions(s) / UC Diagnoses   Final diagnoses:  Pilonidal abscess     Discharge Instructions     Warm soaks/ compresses to promote further drainage.  Tylenol  as needed for pain. Tramadol for breakthrough pain. May cause drowsiness. Please do not take if driving or drinking alcohol.  May cause constipation. Increase fluid intake.  Complete course of antibiotics.  May need to follow up with general surgery if recurrent for removal.  If worsening over the next few days may return for incision and drainage.    ED Prescriptions    Medication Sig Dispense Auth. Provider   doxycycline (VIBRAMYCIN) 100 MG capsule Take 1 capsule (100 mg total) by mouth 2 (two) times daily. 20 capsule Linus MakoBurky, Nevaen Tredway B, NP   traMADol (ULTRAM) 50 MG tablet Take 1 tablet (50 mg total) by mouth every 8 (eight) hours as needed. 10 tablet Georgetta HaberBurky, Jhada Risk B, NP     Controlled Substance Prescriptions Page Controlled Substance Registry consulted? Not Applicable   Georgetta HaberBurky, Ivis Henneman B, NP 08/10/18 [phone]

## 2018-08-20 ENCOUNTER — Ambulatory Visit: Payer: Self-pay | Admitting: Surgery

## 2018-08-20 NOTE — H&P (Signed)
Leah Sheliah MendsM Failla Documented: 08/20/2018 3:18 PM Location: [facility] Patient #: [id number] DOB: July 17, 1985 Married / Language: English / Race: Black or African American Female  History of Present Illness Leah Shea(Leah Dieu C. Amare Bail MD; 08/20/2018 3:43 PM) The patient is a 33 year old female who presents with a pilonidal cyst. Note for "Pilonidal cyst": ` ` ` Patient sent for surgical consultation at the request of Leah Shea Gosai  Chief Complaint: Recurrent pilonidal abscess. ` ` The patient is a pleasant young woman. History of smoking. She's got down a few cigarettes a day. Her mother recall an episode of pain and an abscess between her buttock cheeks on her tailbone when she was in high school. Not as many issues since. However she had another flare of pain and swelling. Very intense. [facility]. Abscess suspected. Placed on antibiotics. He followed up with our office. Had started to burst and spontaneously drained. Recommendation made for follow-up colorectal surgery Leah Shea comes in today for follow-up. She is almost no other antibiotics. The drainage is gone down but never fully gone away. She did get constipated with straining and hasn't irritated hemorrhoids but otherwise no major issues. Had any prior hemorrhoid flares. She is not diabetic. No history of MRSA. No history of hidradenitis. She used to be in [employer] but has since left the [employer].  No personal nor family history of GI/colon cancer, inflammatory bowel disease, irritable bowel syndrome, allergy such as Celiac Sprue, dietary/dairy problems, colitis, ulcers nor gastritis. No recent sick contacts/gastroenteritis. No travel outside the country. No changes in diet. No dysphagia to solids or liquids. No significant heartburn or reflux. No hematochezia, hematemesis, coffee ground emesis. No evidence of prior gastric/peptic ulceration.  (Review of systems as stated in this history (HPI) or in the review of  systems. Otherwise all other 12 point ROS are negative) ` ` `   Allergies Leah Shea(Leah She[place], [place]; 08/20/2018 3:19 PM) NSAIDs Sulfa Antibiotics Allergies Reconciled  Medication History Leah Shea(Leah Shea, CMA; 08/20/2018 3:19 PM) Doxycycline Hyclate (100MG  Capsule, Oral) Active. Medications Reconciled    Vitals Leah Shea(Leah Shea CMA; 08/20/2018 3:18 PM) 08/20/2018 3:18 PM Weight: 205 lb Height: 66.5in Body Surface Area: 2.03 m Body Mass Index: 32.59 kg/m  Temp.: 99.48F  Pulse: 107 (Regular)  BP: 124/80 (Sitting, Left Arm, Standard)        Physical Exam Leah Shea(Leah Toney C. Roshelle Traub MD; 08/20/2018 3:31 PM)  General Mental Status-Alert. General Appearance-Not in acute distress, Not Sickly. Orientation-Oriented X3. Hydration-Well hydrated. Voice-Normal.  Integumentary Global Assessment Upon inspection and palpation of skin surfaces of the - Axillae: non-tender, no inflammation or ulceration, no drainage. and Distribution of scalp and body hair is normal. General Characteristics Temperature - normal warmth is noted.  Head and Neck Head-normocephalic, atraumatic with no lesions or palpable masses. Face Global Assessment - atraumatic, no absence of expression. Neck Global Assessment - no abnormal movements, no bruit auscultated on the right, no bruit auscultated on the left, no decreased range of motion, non-tender. Trachea-midline. Thyroid Gland Characteristics - non-tender.  Eye Eyeball - Left-Extraocular movements intact, No Nystagmus - Left. Eyeball - Right-Extraocular movements intact, No Nystagmus - Right. Cornea - Left-No Hazy - Left. Cornea - Right-No Hazy - Right. Sclera/Conjunctiva - Left-No scleral icterus, No Discharge - Left. Sclera/Conjunctiva - Right-No scleral icterus, No Discharge - Right. Pupil - Left-Direct reaction to light normal. Pupil - Right-Direct reaction to light normal.  ENMT Ears Pinna - Left - no drainage  observed, no generalized tenderness observed. Pinna - Right - no  drainage observed, no generalized tenderness observed. Nose and Sinuses External Inspection of the Nose - no destructive lesion observed. Inspection of the nares - Left - quiet respiration. Inspection of the nares - Right - quiet respiration. Mouth and Throat Lips - Upper Lip - no fissures observed, no pallor noted. Lower Lip - no fissures observed, no pallor noted. Nasopharynx - no discharge present. Oral Cavity/Oropharynx - Tongue - no dryness observed. Oral Mucosa - no cyanosis observed. Hypopharynx - no evidence of airway distress observed.  Chest and Lung Exam Inspection Movements - Normal and Symmetrical. Accessory muscles - No use of accessory muscles in breathing. Palpation Palpation of the chest reveals - Non-tender. Auscultation Breath sounds - Normal and Clear.  Cardiovascular Auscultation Rhythm - Regular. Murmurs & Other Heart Sounds - Auscultation of the heart reveals - No Murmurs and No Systolic Clicks.  Abdomen Inspection Inspection of the abdomen reveals - No Visible peristalsis and No Abnormal pulsations. Umbilicus - No Bleeding, No Urine drainage. Palpation/Percussion Palpation and Percussion of the abdomen reveal - Soft, Non Tender, No Rebound tenderness, No Rigidity (guarding) and No Cutaneous hyperesthesia. Note: Abdomen soft. Not severely distended. No distasis recti. No umbilical or other anterior abdominal wall hernias  Female Genitourinary Sexual Maturity Tanner 5 - Adult hair pattern. Note: No vaginal bleeding nor discharge  Rectal Note: Deep intergluteal cleft with some central moisture 4 x 2 cm region. Some firm nodularity in the right upper intergluteal cleft suspicious for chronic pilonidal disease. No major cyst or pits.  Right posterior mildly inflamed partially prolapsed internal hemorrhoid. No definite abscess or bleeding. Normal sphincter tone. No fissure. No obvious fistula.  Held off on any digital or anoscopic exam.  Peripheral Vascular Upper Extremity Inspection - Left - No Cyanotic nailbeds - Left, Not Ischemic. Inspection - Right - No Cyanotic nailbeds - Right, Not Ischemic.  Neurologic Neurologic evaluation reveals -normal attention span and ability to concentrate, able to name objects and repeat phrases. Appropriate fund of knowledge , normal sensation and normal coordination. Mental Status Affect - not angry, not paranoid. Cranial Nerves-Normal Bilaterally. Gait-Normal.  Neuropsychiatric Mental status exam performed with findings of-able to articulate well with normal speech/language, rate, volume and coherence, thought content normal with ability to perform basic computations and apply abstract reasoning and no evidence of hallucinations, delusions, obsessions or homicidal/suicidal ideation.  Musculoskeletal Global Assessment Spine, Ribs and Pelvis - no instability, subluxation or laxity. Right Upper Extremity - no instability, subluxation or laxity.  Lymphatic Head & Neck  General Head & Neck Lymphatics: Bilateral - Description - No Localized lymphadenopathy. Axillary  General Axillary Region: Bilateral - Description - No Localized lymphadenopathy. Femoral & Inguinal  Generalized Femoral & Inguinal Lymphatics: Left - Description - No Localized lymphadenopathy. Right - Description - No Localized lymphadenopathy.    Assessment & Plan Leah Hector MD; 08/20/2018 3:42 PM)  PILONIDAL CYST WITHOUT INFECTION (L05.91) Impression: Pain and swelling in the left foot drainage suspicious for pilonidal abscess. Spontaneous injury to improve with antibiotics. Similar episodes starting in high school.  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 Trying closed in layers.  I think her operative risks are low, her persistent smoking cigarettes increases the risk of recurrence include infection much higher.  She is down to just a few cigarettes a day. I strongly encouraged her to totally quit.  She would like to get this done as soon as possible.  Current Plans The anatomy of the gluteal and sacral  region was discussed. Pathophysiology of pilonidal disease due to ingrown hairs was discussed. Importance of good hygiene discussed. Importance of keeping hairs trimmed in the intergluteal crease from anus to top of sacrum/tailbone spine noted. Need for good hygiene stressed. Use of electric razor or nose hair trimmers to keep the hairs trimmed discussed.  Risk of worsening progression needing surgical drainage of abscess or perhaps excision of chronic pilonidal disease with skin flap closure over drains discussed. Possible need for her to leave it open with packing to allow the wound close over several weeks/months discussed. Risks benefits alternatives to surgery discussed as well. Risk of recurrent disease discussed. Patient was expressed understanding. Literature given to patient. We will see if we can control this without an operation first.  Pt Education - [facility] Pilonidal Disease (AT) The anatomy of the intragluteal cleft was discussed. Pathophysiology of pilonidal disease was discussed. The importance of keeping hairs trimmed to avoid recurrence was discussed. Discussion of options such as curretage, excision with closure vs leaving open was discussed. Risks of infection with need for incision and drainage & antibiotics were discussed. I noted a good likelihood this will help address the problem.  At this point, I think the patient would best served with considering surgery to excise the diseased tissue. I will make an attempt to close but it may need to be left open to allow it to heal with secondary intention and wound packing. Possible recurrences need reoperation or different techniques were discussed as well. I noted that recurrence is higher with poor compliance on hair removal hygiene &  overall health. The patient's questions were answered. The patient agrees to proceed.  Pt Education - [facility] Pilonidal Surgery HCI - post op instructions: discussed with patient and provided information.  PROLAPSED INTERNAL HEMORRHOIDS, GRADE 3 (K64.2) Impression: Irritated swollen hemorrhoid partially prolapsing and right posterior aspect. No history of other major hemorrhoid issues. She thinks is related to increased straining given the narcotics that she needed to deal with her painful pilonidal abscess.  The anatomy & physiology of the anorectal region was discussed. The pathophysiology of hemorrhoids and differential diagnosis was discussed. Natural history progression was discussed. I stressed the importance of a bowel regimen to have daily soft bowel movements to minimize progression of disease. Goal of one BM / day ideal. Use of wet wipes, warm baths, avoiding straining, etc were emphasized.  Educational handouts further explaining the pathology, treatment options, and bowel regimen were given as well. The patient expressed understanding.  Current Plans Pt Education - [facility] Hemorrhoids (Leah Shea): discussed with patient and provided information.  TOBACCO ABUSE (Z72.0)  Current Plans Pt Education - [facility] STOP SMOKING!  Leah SportsmanSteven C. Nico Syme, MD, FACS, MASCRS Gastrointestinal and Minimally Invasive Surgery    [address] [place] [id number] [phone] Main / Paging [phone] Fax

## 2018-10-31 ENCOUNTER — Other Ambulatory Visit: Payer: Self-pay

## 2018-10-31 ENCOUNTER — Other Ambulatory Visit (HOSPITAL_COMMUNITY)
Admission: RE | Admit: 2018-10-31 | Discharge: 2018-10-31 | Disposition: A | Discharge status: Home / Self Care | Payer: No Typology Code available for payment source | Source: Ambulatory Visit | Attending: Surgery | Admitting: Surgery

## 2018-11-02 ENCOUNTER — Ambulatory Visit (HOSPITAL_BASED_OUTPATIENT_CLINIC_OR_DEPARTMENT_OTHER): Admit: 2018-11-02 | Payer: No Typology Code available for payment source | Admitting: Surgery

## 2018-11-02 ENCOUNTER — Other Ambulatory Visit: Payer: Self-pay | Admitting: Surgery

## 2018-11-02 ENCOUNTER — Encounter (HOSPITAL_BASED_OUTPATIENT_CLINIC_OR_DEPARTMENT_OTHER): Payer: Self-pay

## 2018-11-02 SURGERY — EXCISION, SIMPLE PILONIDAL CYST
Anesthesia: General

## 2018-12-20 ENCOUNTER — Emergency Department (HOSPITAL_COMMUNITY)
Admission: EM | Admit: 2018-12-20 | Discharge: 2018-12-20 | Disposition: A | Discharge status: Home / Self Care | Payer: No Typology Code available for payment source | Attending: Emergency Medicine | Admitting: Emergency Medicine

## 2018-12-20 ENCOUNTER — Encounter (HOSPITAL_COMMUNITY): Payer: Self-pay

## 2018-12-20 DIAGNOSIS — Z5321 Procedure and treatment not carried out due to patient leaving prior to being seen by health care provider: Secondary | ICD-10-CM | POA: Diagnosis not present

## 2018-12-20 DIAGNOSIS — M542 Cervicalgia: Secondary | ICD-10-CM | POA: Diagnosis not present

## 2018-12-20 DIAGNOSIS — M545 Low back pain: Secondary | ICD-10-CM | POA: Diagnosis present
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
## 2018-12-21 ENCOUNTER — Other Ambulatory Visit: Payer: Self-pay

## 2018-12-21 ENCOUNTER — Ambulatory Visit (HOSPITAL_COMMUNITY)
Admission: EM | Admit: 2018-12-21 | Discharge: 2018-12-21 | Disposition: A | Discharge status: Home / Self Care | Payer: Medicaid Other | Attending: Family Medicine | Admitting: Family Medicine

## 2018-12-21 ENCOUNTER — Encounter (HOSPITAL_COMMUNITY): Payer: Self-pay

## 2018-12-21 DIAGNOSIS — S46811A Strain of other muscles, fascia and tendons at shoulder and upper arm level, right arm, initial encounter: Secondary | ICD-10-CM | POA: Diagnosis not present

## 2018-12-21 DIAGNOSIS — M545 Low back pain, unspecified: Secondary | ICD-10-CM

## 2018-12-21 MED ORDER — TRAMADOL HCL 50 MG PO TABS: 50.0000 mg | ORAL_TABLET | Freq: Three times a day (TID) | ORAL | 0 refills | Status: DC | PRN

## 2018-12-21 MED ORDER — CYCLOBENZAPRINE HCL 10 MG PO TABS: 10.0000 mg | ORAL_TABLET | Freq: Three times a day (TID) | ORAL | 1 refills | Status: DC | PRN
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
## 2018-12-24 ENCOUNTER — Other Ambulatory Visit: Payer: Self-pay

## 2018-12-24 ENCOUNTER — Emergency Department (HOSPITAL_COMMUNITY): Payer: No Typology Code available for payment source

## 2018-12-24 ENCOUNTER — Encounter (HOSPITAL_COMMUNITY): Payer: Self-pay

## 2018-12-24 ENCOUNTER — Emergency Department (HOSPITAL_COMMUNITY)
Admission: EM | Admit: 2018-12-24 | Discharge: 2018-12-25 | Disposition: A | Discharge status: Home / Self Care | Payer: No Typology Code available for payment source | Attending: Emergency Medicine | Admitting: Emergency Medicine

## 2018-12-24 DIAGNOSIS — L0591 Pilonidal cyst without abscess: Secondary | ICD-10-CM | POA: Diagnosis not present

## 2018-12-24 DIAGNOSIS — R109 Unspecified abdominal pain: Secondary | ICD-10-CM | POA: Diagnosis not present

## 2018-12-24 DIAGNOSIS — Z87891 Personal history of nicotine dependence: Secondary | ICD-10-CM | POA: Diagnosis not present

## 2018-12-24 DIAGNOSIS — Z79899 Other long term (current) drug therapy: Secondary | ICD-10-CM | POA: Diagnosis not present

## 2018-12-24 DIAGNOSIS — Y92488 Other paved roadways as the place of occurrence of the external cause: Secondary | ICD-10-CM | POA: Diagnosis not present

## 2018-12-24 DIAGNOSIS — Y939 Activity, unspecified: Secondary | ICD-10-CM | POA: Diagnosis not present

## 2018-12-24 DIAGNOSIS — Y999 Unspecified external cause status: Secondary | ICD-10-CM | POA: Insufficient documentation

## 2018-12-24 DIAGNOSIS — M25511 Pain in right shoulder: Secondary | ICD-10-CM | POA: Diagnosis present

## 2018-12-24 LAB — I-STAT CHEM 8, ED
BUN: 18 mg/dL (ref 6–20)
Calcium, Ion: 1.24 mmol/L (ref 1.15–1.40)
Chloride: 107 mmol/L (ref 98–111)
Creatinine, Ser: 0.8 mg/dL (ref 0.44–1.00)
Glucose, Bld: 96 mg/dL (ref 70–99)
HCT: 37 % (ref 36.0–46.0)
Hemoglobin: 12.6 g/dL (ref 12.0–15.0)
Potassium: 4.3 mmol/L (ref 3.5–5.1)
Sodium: 141 mmol/L (ref 135–145)
TCO2: 26 mmol/L (ref 22–32)

## 2018-12-24 LAB — I-STAT BETA HCG BLOOD, ED (MC, WL, AP ONLY): I-stat hCG, quantitative: <5 m[IU]/mL (ref <5)
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
## 2018-12-24 MED ORDER — OXYCODONE-ACETAMINOPHEN 5-325 MG PO TABS
1.0000 | ORAL_TABLET | Freq: Once | ORAL | Status: AC
  Administered 2018-12-24: 16:00:00 1 via ORAL
  Filled 2018-12-24: qty 1

## 2018-12-24 MED ORDER — IOHEXOL 300 MG/ML  SOLN
100.0000 mL | Freq: Once | INTRAMUSCULAR | Status: AC | PRN
  Administered 2018-12-24: 100 mL via INTRAVENOUS

## 2018-12-24 MED ORDER — LIDOCAINE-EPINEPHRINE (PF) 2 %-1:200000 IJ SOLN
10.0000 mL | Freq: Once | INTRAMUSCULAR | Status: AC
  Administered 2018-12-24: 10 mL
  Filled 2018-12-24: qty 20

## 2018-12-24 MED ORDER — MORPHINE SULFATE (PF) 4 MG/ML IV SOLN
4.0000 mg | Freq: Once | INTRAVENOUS | Status: AC
  Administered 2018-12-24: 4 mg via INTRAVENOUS
  Filled 2018-12-24: qty 1

## 2018-12-24 MED ORDER — OXYCODONE-ACETAMINOPHEN 5-325 MG PO TABS: 1.0000 | ORAL_TABLET | Freq: Four times a day (QID) | ORAL | 0 refills | Status: DC | PRN

## 2018-12-24 MED ORDER — HYDROCODONE-ACETAMINOPHEN 5-325 MG PO TABS: 1.0000 | ORAL_TABLET | Freq: Once | ORAL | Status: DC

## 2018-12-24 MED ORDER — DOXYCYCLINE HYCLATE 100 MG PO CAPS: 100.0000 mg | ORAL_CAPSULE | Freq: Two times a day (BID) | ORAL | 0 refills | Status: DC
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
## 2018-12-24 NOTE — ED Notes (Signed)
Pt requesting pain meds.  PA notified. 

## 2019-02-08 ENCOUNTER — Emergency Department (HOSPITAL_COMMUNITY)
Admission: EM | Admit: 2019-02-08 | Discharge: 2019-02-08 | Disposition: A | Discharge status: Home / Self Care | Payer: No Typology Code available for payment source | Attending: Emergency Medicine | Admitting: Emergency Medicine

## 2019-02-08 ENCOUNTER — Other Ambulatory Visit: Payer: Self-pay

## 2019-02-08 ENCOUNTER — Emergency Department (HOSPITAL_COMMUNITY): Payer: No Typology Code available for payment source

## 2019-02-08 ENCOUNTER — Encounter (HOSPITAL_COMMUNITY): Payer: Self-pay | Admitting: Emergency Medicine

## 2019-02-08 DIAGNOSIS — R509 Fever, unspecified: Secondary | ICD-10-CM | POA: Insufficient documentation

## 2019-02-08 DIAGNOSIS — J45909 Unspecified asthma, uncomplicated: Secondary | ICD-10-CM | POA: Insufficient documentation

## 2019-02-08 DIAGNOSIS — D571 Sickle-cell disease without crisis: Secondary | ICD-10-CM | POA: Insufficient documentation

## 2019-02-08 DIAGNOSIS — Z20828 Contact with and (suspected) exposure to other viral communicable diseases: Secondary | ICD-10-CM | POA: Insufficient documentation

## 2019-02-08 DIAGNOSIS — R0789 Other chest pain: Secondary | ICD-10-CM | POA: Insufficient documentation

## 2019-02-08 DIAGNOSIS — R197 Diarrhea, unspecified: Secondary | ICD-10-CM | POA: Diagnosis not present

## 2019-02-08 DIAGNOSIS — Z87891 Personal history of nicotine dependence: Secondary | ICD-10-CM | POA: Insufficient documentation

## 2019-02-08 DIAGNOSIS — R0602 Shortness of breath: Secondary | ICD-10-CM | POA: Insufficient documentation

## 2019-02-08 DIAGNOSIS — B349 Viral infection, unspecified: Secondary | ICD-10-CM

## 2019-02-08 LAB — CBC
HCT: 37.8 % (ref 36.0–46.0)
Hemoglobin: 12.7 g/dL (ref 12.0–15.0)
MCH: 29.9 pg (ref 26.0–34.0)
MCHC: 33.6 g/dL (ref 30.0–36.0)
MCV: 88.9 fL (ref 80.0–100.0)
Platelets: 223 10*3/uL (ref 150–400)
RBC: 4.25 MIL/uL (ref 3.87–5.11)
RDW: 12.8 % (ref 11.5–15.5)
WBC: 6.2 10*3/uL (ref 4.0–10.5)
nRBC: 0 % (ref 0.0–0.2)

## 2019-02-08 LAB — LIPASE, BLOOD: Lipase: 23 U/L (ref 11–51)

## 2019-02-08 LAB — BASIC METABOLIC PANEL
Anion gap: 9 (ref 5–15)
BUN: 11 mg/dL (ref 6–20)
CO2: 21 mmol/L — ABNORMAL LOW (ref 22–32)
Calcium: 9.4 mg/dL (ref 8.9–10.3)
Chloride: 110 mmol/L (ref 98–111)
Creatinine, Ser: 0.91 mg/dL (ref 0.44–1.00)
GFR calc Af Amer: >60 mL/min (ref >60)
GFR calc non Af Amer: >60 mL/min (ref >60)
Glucose, Bld: 118 mg/dL — ABNORMAL HIGH (ref 70–99)
Potassium: 3.8 mmol/L (ref 3.5–5.1)
Sodium: 140 mmol/L (ref 135–145)

## 2019-02-08 LAB — I-STAT BETA HCG BLOOD, ED (MC, WL, AP ONLY): I-stat hCG, quantitative: <5 m[IU]/mL (ref <5)

## 2019-02-08 LAB — TROPONIN I (HIGH SENSITIVITY): Troponin I (High Sensitivity): <2 ng/L (ref <18)

## 2019-02-08 IMAGING — DX DG CHEST 1V PORT
1 series · 1 of 1 positions shown · non-contrast
Comparison: Radiographs [DATE] and [DATE].

CLINICAL DATA: Chest pain with shortness of breath, vomiting,
diarrhea and fever for 2 days.

EXAM:
PORTABLE CHEST 1 VIEW

[chest ap]
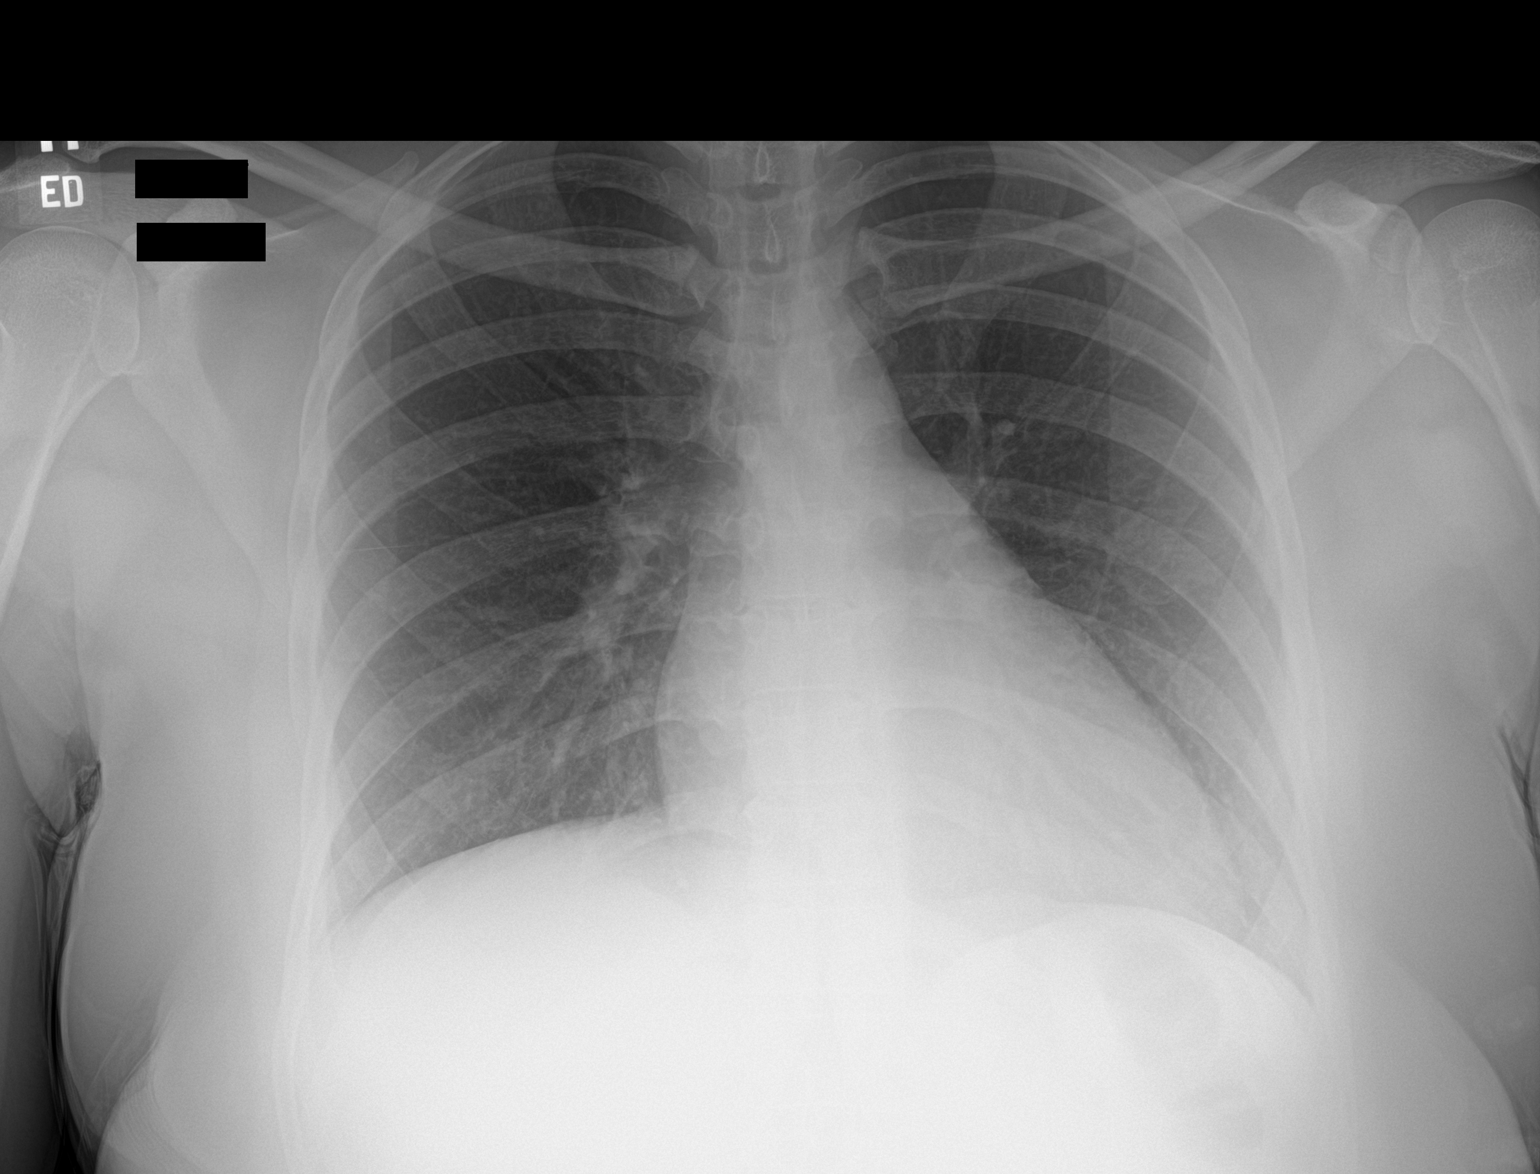

[1 of 1 positions shown; findings below may reference images not displayed]

FINDINGS: [C9] hours. The heart size and mediastinal contours are normal. The
lungs are clear. There is no pleural effusion or pneumothorax. No
acute osseous findings are identified.
IMPRESSION: No active cardiopulmonary process.

## 2019-02-08 MED ORDER — ACETAMINOPHEN 325 MG PO TABS
650.0000 mg | ORAL_TABLET | Freq: Once | ORAL | Status: AC
  Administered 2019-02-08: 650 mg via ORAL
  Filled 2019-02-08: qty 2

## 2019-02-08 MED ORDER — SODIUM CHLORIDE 0.9% FLUSH: 3.0000 mL | Freq: Once | INTRAVENOUS | Status: DC

## 2019-02-08 MED ORDER — ONDANSETRON 8 MG PO TBDP: 8.0000 mg | ORAL_TABLET | Freq: Three times a day (TID) | ORAL | 0 refills | Status: DC | PRN
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
## 2019-02-08 NOTE — ED Notes (Signed)
Patient verbalizes understanding of discharge instructions. Opportunity for questioning and answers were provided. Armband removed by staff, pt discharged from ED ambulatory.   

## 2019-02-08 NOTE — ED Provider Notes (Signed)
[facility] Provider Note   CSN: [id number] Arrival date & time: 02/08/19  1359     History Chief Complaint  Patient presents with  . Shortness of Breath  . Fever  . Emesis  . Diarrhea  . Chest Pain    Leah Shea is a 33 y.o. female.  33 year old female presents with multiple symptoms including sore throat, emesis x3, diarrhea, body aches.  Concerned about possible Covid exposures.  Denies any difference to her taste or smell sensations.  Mild cough without severe dyspnea.  Husband has similar symptoms.  No treatment used prior to arrival nothing makes her symptoms better or worse.  Patient has had an influenza vaccine this season        Past Medical History:  Diagnosis Date  . Anemia   . Anxiety   . Anxiety   . Asthma   . Blood dyscrasia    sickle trait  . Bronchitis, acute   . Depression   . Diverticular disease   . Family history of anesthesia complication    son due sleep apnea  . Family history of breast cancer   . Family history of prostate cancer   . Headache(784.0)    migraines  . PTSD (post-traumatic stress disorder)   . Shortness of breath    with exertion or panic attack  . Sickle cell anemia (HCC) trait   patient claims asymptomatic  . Sleep apnea    [facility]    Patient Active Problem List   Diagnosis Date Noted  . Genetic testing 02/23/2018  . Family history of breast cancer   . Family history of prostate cancer   . Tonsillar hypertrophy 10/07/2013    Past Surgical History:  Procedure Laterality Date  . BREAST SURGERY Bilateral    reductions  . CESAREAN SECTION  2010, [id number]   x 3  . EYE SURGERY     x 10 as a child  . TONSILLECTOMY N/A 10/07/2013   Procedure: TONSILLECTOMY;  Surgeon: Christia Readingwight Bates, MD;  [facility];  Service: ENT;  Laterality: N/A;  . TUMOR REMOVAL     tumor removed from back     OB History   No obstetric history on file.      Family History  Problem Relation Age of Onset  . Breast cancer Mother   . Breast cancer Maternal Aunt 36       metastatic  . Cancer Maternal Uncle        unk type  . Breast cancer Maternal Grandmother   . Prostate cancer Maternal Grandfather        metastatic  . Cancer Maternal Aunt        either breast or ovarian     Social History   Tobacco Use  . Smoking status: Former Smoker    Packs/day: 0.25    Years: 1.00    Pack years: 0.25    Types: Cigarettes    Quit date: 07/02/2013    Years since quitting: 5.6  . Smokeless tobacco: Never Used  Substance Use Topics  . Alcohol use: Yes  . Drug use: Yes    Types: Marijuana    Home Medications Prior to Admission medications   Medication Sig Start Date End Date Taking? Authorizing Provider  albuterol (PROVENTIL HFA;VENTOLIN HFA) 108 (90 Base) MCG/ACT inhaler Inhale 2 puffs into the lungs every 4 (four) hours as needed for wheezing or shortness of breath. 04/10/15  Cheri Fowler, PA-C  cyclobenzaprine (FLEXERIL) 10 MG tablet Take 1 tablet (10 mg total) by mouth 3 (three) times daily as needed for muscle spasms. 12/21/18   Elvina Sidle, MD  doxycycline (VIBRAMYCIN) 100 MG capsule Take 1 capsule (100 mg total) by mouth 2 (two) times daily. 12/24/18   Emi Holes, PA-C  levonorgestrel (MIRENA) 20 MCG/24HR IUD by Intrauterine route. 01/06/13   [provider]  oxyCODONE-acetaminophen (PERCOCET/ROXICET) 5-325 MG tablet Take 1-2 tablets by mouth every 6 (six) hours as needed for severe pain. 12/24/18   Law, Waylan Boga, PA-C  traMADol (ULTRAM) 50 MG tablet Take 1 tablet (50 mg total) by mouth every 8 (eight) hours as needed. 12/21/18   Elvina Sidle, MD  fluticasone (FLONASE) 50 MCG/ACT nasal spray Place 2 sprays into both nostrils daily. Patient not taking: Reported on 06/22/2017 10/24/16 08/10/18  Belinda Fisher, PA-C    Allergies    Other, Nsaids, and Sulfa antibiotics  Review of Systems   Review of Systems  All other  systems reviewed and are negative.   Physical Exam Updated Vital Signs BP (!) 133/95 (BP Location: Right Arm)   Pulse 79   Temp 98.2 F (36.8 C) (Oral)   Resp 16   Ht 1.689 m (5' 6.5")   Wt 95.3 kg   SpO2 95%   BMI 33.39 kg/m   Physical Exam Vitals and nursing note reviewed.  Constitutional:      General: She is not in acute distress.    Appearance: Normal appearance. She is well-developed. She is not toxic-appearing.  HENT:     Head: Normocephalic and atraumatic.  Eyes:     General: Lids are normal.     Conjunctiva/sclera: Conjunctivae normal.     Pupils: Pupils are equal, round, and reactive to light.  Neck:     Thyroid: No thyroid mass.     Trachea: No tracheal deviation.  Cardiovascular:     Rate and Rhythm: Normal rate and regular rhythm.     Heart sounds: Normal heart sounds. No murmur. No gallop.   Pulmonary:     Effort: Pulmonary effort is normal. No respiratory distress.     Breath sounds: Normal breath sounds. No stridor. No decreased breath sounds, wheezing, rhonchi or rales.  Abdominal:     General: Bowel sounds are normal. There is no distension.     Palpations: Abdomen is soft.     Tenderness: There is no abdominal tenderness. There is no rebound.  Musculoskeletal:        General: No tenderness. Normal range of motion.     Cervical back: Normal range of motion and neck supple.  Skin:    General: Skin is warm and dry.     Findings: No abrasion or rash.  Neurological:     Mental Status: She is alert and oriented to person, place, and time.     GCS: GCS eye subscore is 4. GCS verbal subscore is 5. GCS motor subscore is 6.     Cranial Nerves: No cranial nerve deficit.     Sensory: No sensory deficit.  Psychiatric:        Speech: Speech normal.        Behavior: Behavior normal.     ED Results / Procedures / Treatments   Labs (all labs ordered are listed, but only abnormal results are displayed) Labs Reviewed  BASIC METABOLIC PANEL - Abnormal;  Notable for the following components:      Result Value   CO2  21 (*)    Glucose, Bld 118 (*)    All other components within normal limits  SARS CORONAVIRUS 2 (TAT 6-24 HRS)  CBC  LIPASE, BLOOD  URINALYSIS, ROUTINE W REFLEX MICROSCOPIC  I-STAT BETA HCG BLOOD, ED ([facility], [facility], [facility] ONLY)  TROPONIN I (HIGH SENSITIVITY)  TROPONIN I (HIGH SENSITIVITY)    EKG EKG Interpretation  Date/Time:  Friday February 08 2019 14:03:55 EST Ventricular Rate:  94 PR Interval:  154 QRS Duration: 72 QT Interval:  354 QTC Calculation: 442 R Axis:   76 Text Interpretation: Normal sinus rhythm Nonspecific T wave abnormality Abnormal ECG Confirmed by Lacretia Leigh ([id number]) on 02/08/2019 7:58:06 PM   Radiology DG Chest Portable 1 View  Result Date: 02/08/2019 CLINICAL DATA:  Chest pain with shortness of breath, vomiting, diarrhea and fever for 2 days. EXAM: PORTABLE CHEST 1 VIEW COMPARISON:  Radiographs 12/24/2018 and 06/11/2015. FINDINGS: 1450 hours. The heart size and mediastinal contours are normal. The lungs are clear. There is no pleural effusion or pneumothorax. No acute osseous findings are identified. IMPRESSION: No active cardiopulmonary process. Electronically Signed   By: Richardean Sale M.D.   On: 02/08/2019 15:07    Procedures Procedures (including critical care time)  Medications Ordered in ED Medications  sodium chloride flush (NS) 0.9 % injection 3 mL (has no administration in time range)  acetaminophen (TYLENOL) tablet 650 mg (has no administration in time range)    ED Course  I have reviewed the triage vital signs and the nursing notes.  Pertinent labs & imaging results that were available during my care of the patient were reviewed by me and considered in my medical decision making (see chart for details).    MDM Rules/Calculators/A&P                     Patient offered IV fluids and has deferred at this time.  Will send Covid test.  Chest x-ray without acute findings.  Labs  reassuring.  Return precautions given.       Final Clinical Impression(s) / ED Diagnoses Final diagnoses:  None    Rx / DC Orders ED Discharge Orders    None       Lacretia Leigh, MD 02/08/19 2025

## 2019-02-08 NOTE — Discharge Instructions (Signed)
Person Under Monitoring Name: Leah Shea  Location: [place] [address]   Infection Prevention Recommendations for Individuals Confirmed to have, or Being Evaluated for, 2019 Novel Coronavirus (COVID-19) Infection Who Receive Care at Home  Individuals who are confirmed to have, or are being evaluated for, COVID-19 should follow the prevention steps below until a healthcare provider or local or state health department says they can return to normal activities.  Stay home except to get medical care You should restrict activities outside your home, except for getting medical care. Do not go to work, school, or public areas, and do not use public transportation or taxis.  Call ahead before visiting your doctor Before your medical appointment, call the healthcare provider and tell them that you have, or are being evaluated for, COVID-19 infection. This will help the healthcare provider's office take steps to keep other people from getting infected. Ask your healthcare provider to call the local or state health department.  Monitor your symptoms Seek prompt medical attention if your illness is worsening (e.g., difficulty breathing). Before going to your medical appointment, call the healthcare provider and tell them that you have, or are being evaluated for, COVID-19 infection. Ask your healthcare provider to call the local or state health department.  Wear a facemask You should wear a facemask that covers your nose and mouth when you are in the same room with other people and when you visit a healthcare provider. People who live with or visit you should also wear a facemask while they are in the same room with you.  Separate yourself from other people in your home As much as possible, you should stay in a different room from other people in your home. Also, you should use a separate bathroom, if available.  Avoid sharing household items You should not share  dishes, drinking glasses, cups, eating utensils, towels, bedding, or other items with other people in your home. After using these items, you should wash them thoroughly with soap and water.  Cover your coughs and sneezes Cover your mouth and nose with a tissue when you cough or sneeze, or you can cough or sneeze into your sleeve. Throw used tissues in a lined trash can, and immediately wash your hands with soap and water for at least 20 seconds or use an alcohol-based hand rub.  Wash your [facility] your hands often and thoroughly with soap and water for at least 20 seconds. You can use an alcohol-based hand sanitizer if soap and water are not available and if your hands are not visibly dirty. Avoid touching your eyes, nose, and mouth with unwashed hands.   Prevention Steps for Caregivers and Household Members of Individuals Confirmed to have, or Being Evaluated for, COVID-19 Infection Being Cared for in the Home  If you live with, or provide care at home for, a person confirmed to have, or being evaluated for, COVID-19 infection please follow these guidelines to prevent infection:  Follow healthcare provider's instructions Make sure that you understand and can help the patient follow any healthcare provider instructions for all care.  Provide for the patient's basic needs You should help the patient with basic needs in the home and provide support for getting groceries, prescriptions, and other personal needs.  Monitor the patient's symptoms If they are getting sicker, call his or her medical provider and tell them that the patient has, or is being evaluated for, COVID-19 infection. This will help the healthcare provider's office  take steps to keep other people from getting infected. Ask the healthcare provider to call the local or state health department.  Limit the number of people who have contact with the patient If possible, have only one caregiver for the patient. Other  household members should stay in another home or place of residence. If this is not possible, they should stay in another room, or be separated from the patient as much as possible. Use a separate bathroom, if available. Restrict visitors who do not have an essential need to be in the home.  Keep older adults, very young children, and other sick people away from the patient Keep older adults, very young children, and those who have compromised immune systems or chronic health conditions away from the patient. This includes people with chronic heart, lung, or kidney conditions, diabetes, and cancer.  Ensure good ventilation Make sure that shared spaces in the home have good air flow, such as from an air conditioner or an opened window, weather permitting.  Wash your hands often Wash your hands often and thoroughly with soap and water for at least 20 seconds. You can use an alcohol based hand sanitizer if soap and water are not available and if your hands are not visibly dirty. Avoid touching your eyes, nose, and mouth with unwashed hands. Use disposable paper towels to dry your hands. If not available, use dedicated cloth towels and replace them when they become wet.  Wear a facemask and gloves Wear a disposable facemask at all times in the room and gloves when you touch or have contact with the patient's blood, body fluids, and/or secretions or excretions, such as sweat, saliva, sputum, nasal mucus, vomit, urine, or feces.  Ensure the mask fits over your nose and mouth tightly, and do not touch it during use. Throw out disposable facemasks and gloves after using them. Do not reuse. Wash your hands immediately after removing your facemask and gloves. If your personal clothing becomes contaminated, carefully remove clothing and launder. Wash your hands after handling contaminated clothing. Place all used disposable facemasks, gloves, and other waste in a lined container before disposing them with  other household waste. Remove gloves and wash your hands immediately after handling these items.  Do not share dishes, glasses, or other household items with the patient Avoid sharing household items. You should not share dishes, drinking glasses, cups, eating utensils, towels, bedding, or other items with a patient who is confirmed to have, or being evaluated for, COVID-19 infection. After the person uses these items, you should wash them thoroughly with soap and water.  Wash laundry thoroughly Immediately remove and wash clothes or bedding that have blood, body fluids, and/or secretions or excretions, such as sweat, saliva, sputum, nasal mucus, vomit, urine, or feces, on them. Wear gloves when handling laundry from the patient. Read and follow directions on labels of laundry or clothing items and detergent. In general, wash and dry with the warmest temperatures recommended on the label.  Clean all areas the individual has used often Clean all touchable surfaces, such as counters, tabletops, doorknobs, bathroom fixtures, toilets, phones, keyboards, tablets, and bedside tables, every day. Also, clean any surfaces that may have blood, body fluids, and/or secretions or excretions on them. Wear gloves when cleaning surfaces the patient has come in contact with. Use a diluted bleach solution (e.g., dilute bleach with 1 part bleach and 10 parts water) or a household disinfectant with a label that says EPA-registered for coronaviruses. To make a bleach  solution at home, add 1 tablespoon of bleach to 1 quart (4 cups) of water. For a larger supply, add  cup of bleach to 1 gallon (16 cups) of water. Read labels of cleaning products and follow recommendations provided on product labels. Labels contain instructions for safe and effective use of the cleaning product including precautions you should take when applying the product, such as wearing gloves or eye protection and making sure you have good ventilation  during use of the product. Remove gloves and wash hands immediately after cleaning.  Monitor yourself for signs and symptoms of illness Caregivers and household members are considered close contacts, should monitor their health, and will be asked to limit movement outside of the home to the extent possible. Follow the monitoring steps for close contacts listed on the symptom monitoring form.   ? If you have additional questions, contact your local health department or call the epidemiologist on call at [phone] (available 24/7). ? This guidance is subject to change. For the most up-to-date guidance from [facility], please refer to their website: [email or web address]

## 2019-02-09 LAB — SARS CORONAVIRUS 2 (TAT 6-24 HRS): SARS Coronavirus 2: NEGATIVE

## 2019-08-17 ENCOUNTER — Encounter (HOSPITAL_COMMUNITY): Payer: Self-pay

## 2019-08-17 ENCOUNTER — Other Ambulatory Visit: Payer: Self-pay

## 2019-08-17 ENCOUNTER — Ambulatory Visit (HOSPITAL_COMMUNITY)
Admission: EM | Admit: 2019-08-17 | Discharge: 2019-08-17 | Disposition: A | Discharge status: Home / Self Care | Payer: Medicaid Other | Attending: Family Medicine | Admitting: Family Medicine

## 2019-08-17 DIAGNOSIS — T22291A Burn of second degree of multiple sites of right shoulder and upper limb, except wrist and hand, initial encounter: Secondary | ICD-10-CM | POA: Diagnosis not present

## 2019-08-17 MED ORDER — HYDROCODONE-ACETAMINOPHEN 5-325 MG PO TABS: 1.0000 | ORAL_TABLET | Freq: Four times a day (QID) | ORAL | 0 refills | Status: DC | PRN

## 2019-08-17 MED ORDER — CEFDINIR 300 MG PO CAPS: 300.0000 mg | ORAL_CAPSULE | Freq: Two times a day (BID) | ORAL | 0 refills | Status: DC
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
## 2019-08-17 NOTE — Discharge Instructions (Addendum)
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
## 2019-10-25 ENCOUNTER — Ambulatory Visit (HOSPITAL_COMMUNITY)
Admission: EM | Admit: 2019-10-25 | Discharge: 2019-10-25 | Disposition: A | Discharge status: Home / Self Care | Payer: No Typology Code available for payment source | Attending: Physician Assistant | Admitting: Physician Assistant

## 2019-10-25 ENCOUNTER — Other Ambulatory Visit: Payer: Self-pay

## 2019-10-25 ENCOUNTER — Encounter (HOSPITAL_COMMUNITY): Payer: Self-pay

## 2019-10-25 DIAGNOSIS — B349 Viral infection, unspecified: Secondary | ICD-10-CM | POA: Diagnosis not present

## 2019-10-25 DIAGNOSIS — R1110 Vomiting, unspecified: Secondary | ICD-10-CM | POA: Diagnosis not present

## 2019-10-25 DIAGNOSIS — R0602 Shortness of breath: Secondary | ICD-10-CM

## 2019-10-25 DIAGNOSIS — Z20822 Contact with and (suspected) exposure to covid-19: Secondary | ICD-10-CM | POA: Insufficient documentation

## 2019-10-25 LAB — POCT RAPID STREP A, ED / UC: Streptococcus, Group A Screen (Direct): NEGATIVE

## 2019-10-25 LAB — SARS CORONAVIRUS 2 (TAT 6-24 HRS): SARS Coronavirus 2: POSITIVE — AB

## 2019-10-25 MED ORDER — CEPACOL SORE THROAT 5.4 MG MT LOZG: 1.0000 | LOZENGE | OROMUCOSAL | 0 refills | Status: DC | PRN

## 2019-10-25 MED ORDER — ONDANSETRON 4 MG PO TBDP
4.0000 mg | ORAL_TABLET | Freq: Once | ORAL | Status: AC
  Administered 2019-10-25: 4 mg via ORAL

## 2019-10-25 MED ORDER — FLUTICASONE PROPIONATE 50 MCG/ACT NA SUSP: 1.0000 | Freq: Every day | NASAL | 2 refills | Status: DC

## 2019-10-25 MED ORDER — ONDANSETRON HCL 4 MG PO TABS: 4.0000 mg | ORAL_TABLET | Freq: Three times a day (TID) | ORAL | 0 refills | Status: DC | PRN

## 2019-10-25 MED ORDER — ONDANSETRON 4 MG PO TBDP
ORAL_TABLET | ORAL | Status: AC
  Filled 2019-10-25: qty 1

## 2019-10-25 MED ORDER — BENZONATATE 100 MG PO CAPS: 100.0000 mg | ORAL_CAPSULE | Freq: Three times a day (TID) | ORAL | 0 refills | Status: DC

## 2019-10-25 MED ORDER — ACETAMINOPHEN 500 MG PO TABS: 1000.0000 mg | ORAL_TABLET | Freq: Three times a day (TID) | ORAL | 0 refills | Status: DC | PRN

## 2019-10-25 NOTE — Discharge Instructions (Signed)
Take the medications as prescribed -Tylenol for headache, body ache and fever -Tessalon for cough -Flonase for congestion -Cepacol for sore throat -Use the Zofran for nausea and vomiting  Continue use your albuterol as needed  Encourage you to drink fluids to include electrolyte beverages, avoid reds  Consider over-the-counter Imodium if stool is not slowing, 1 to 2 tablets daily only.  If severe symptoms of shortness of breath, unable to tolerate fluids, high fevers, severe abdominal pain or other concerning symptoms go to emergency department  If your Covid-19 test is positive, you will receive a phone call from [facility] regarding your results. Negative test results are not called. Both positive and negative results area always visible on MyChart. If you do not have a MyChart account, sign up instructions are in your discharge papers.   Persons who are directed to care for themselves at home may discontinue isolation under the following conditions:   At least 10 days have passed since symptom onset and  At least 24 hours have passed without running a fever (this means without the use of fever-reducing medications) and  Other symptoms have improved.  Persons infected with COVID-19 who never develop symptoms may discontinue isolation and other precautions 10 days after the date of their first positive COVID-19 test.

## 2019-10-25 NOTE — ED Provider Notes (Signed)
[facility]    CSN: [id number] Arrival date & time: 10/25/19  1004      History   Chief Complaint Chief Complaint  Patient presents with  . Emesis  . Shortness of Breath    HPI Leah Shea is a 34 y.o. female.   Patient reports for Covid symptoms. She endorses cough, sore throat, runny nose, nausea, vomiting and diarrhea. Reports and generalized abdominal discomfort. She also endorses a headache. She has a history of migraines and tried her sumatriptan this morning without much improvement. She reports her husband tested positive for Covid 2 days ago. She reports a history of asthma. She states she has had some chest tightness but feels she is breathing okay. Cough has been nonproductive. She has tried Tylenol without much improvement overall. She has not been able to keep much down besides clear fluids. Denies painful urination, frequency or urgency.     Past Medical History:  Diagnosis Date  . Anemia   . Anxiety   . Anxiety   . Asthma   . Blood dyscrasia    sickle trait  . Bronchitis, acute   . Depression   . Diverticular disease   . Family history of anesthesia complication    son due sleep apnea  . Family history of breast cancer   . Family history of prostate cancer   . Headache(784.0)    migraines  . PTSD (post-traumatic stress disorder)   . Shortness of breath    with exertion or panic attack  . Sickle cell anemia (HCC) trait   patient claims asymptomatic  . Sleep apnea    awaiting a sleep study to be done at [facility]    Patient Active Problem List   Diagnosis Date Noted  . Genetic testing 02/23/2018  . Family history of breast cancer   . Family history of prostate cancer   . Tonsillar hypertrophy 10/07/2013    Past Surgical History:  Procedure Laterality Date  . BREAST SURGERY Bilateral    reductions  . CESAREAN SECTION  2010, [id number]   x 3  . EYE SURGERY     x 10 as a child  . TONSILLECTOMY N/A 10/07/2013   Procedure:  TONSILLECTOMY;  Surgeon: Christia Reading, MD;  Location: [facility] OR;  Service: ENT;  Laterality: N/A;  . TUMOR REMOVAL     tumor removed from back    OB History   No obstetric history on file.      Home Medications    Prior to Admission medications   Medication Sig Start Date End Date Taking? Authorizing Provider  albuterol (PROVENTIL HFA;VENTOLIN HFA) 108 (90 Base) MCG/ACT inhaler Inhale 2 puffs into the lungs every 4 (four) hours as needed for wheezing or shortness of breath. 04/10/15  Yes Cheri Fowler, PA-C  aspirin 81 MG chewable tablet Chew by mouth.   Yes [provider]  escitalopram (LEXAPRO) 20 MG tablet Take by mouth.   Yes [provider]  SUMAtriptan Succinate Refill 6 MG/0.5ML SOCT Inject into the skin.   Yes [provider]  acetaminophen (TYLENOL) 500 MG tablet Take 2 tablets (1,000 mg total) by mouth every 8 (eight) hours as needed for moderate pain or fever. 10/25/19   Shoshannah Faubert, Veryl Speak, PA-C  benzonatate (TESSALON) 100 MG capsule Take 1 capsule (100 mg total) by mouth every 8 (eight) hours. 10/25/19   Severn Goddard, Veryl Speak, PA-C  budesonide-formoterol (SYMBICORT) 160-4.5 MCG/ACT inhaler Inhale into the lungs.    [provider]  cefdinir (  OMNICEF) 300 MG capsule Take 1 capsule (300 mg total) by mouth 2 (two) times daily. 08/17/19   Mardella Layman, MD  fluticasone (FLONASE) 50 MCG/ACT nasal spray Place 1 spray into both nostrils daily. 10/25/19   Daron Breeding, Veryl Speak, PA-C  HYDROcodone-acetaminophen (NORCO/VICODIN) 5-325 MG tablet Take 1 tablet by mouth every 6 (six) hours as needed for moderate pain or severe pain. 08/17/19   Mardella Layman, MD  levonorgestrel (MIRENA) 20 MCG/24HR IUD by Intrauterine route. 01/06/13   [provider]  Menthol (CEPACOL SORE THROAT) 5.4 MG LOZG Use as directed 1 lozenge (5.4 mg total) in the mouth or throat every 2 (two) hours as needed. 10/25/19   Johnathyn Viscomi, Veryl Speak, PA-C  ondansetron (ZOFRAN) 4 MG tablet Take 1 tablet (4 mg total) by  mouth every 8 (eight) hours as needed for nausea or vomiting. 10/25/19   Jalin Erpelding, Veryl Speak, PA-C  SUMAtriptan (IMITREX) 100 MG tablet Take by mouth.    [provider]  SUMAtriptan (IMITREX) 20 MG/ACT nasal spray Place into the nose.    [provider]    Family History Family History  Problem Relation Age of Onset  . Breast cancer Mother   . Breast cancer Maternal Aunt 36       metastatic  . Cancer Maternal Uncle        unk type  . Breast cancer Maternal Grandmother   . Prostate cancer Maternal Grandfather        metastatic  . Cancer Maternal Aunt        either breast or ovarian     Social History Social History   Tobacco Use  . Smoking status: Former Smoker    Packs/day: 0.25    Years: 1.00    Pack years: 0.25    Types: Cigarettes    Quit date: 07/02/2013    Years since quitting: 6.3  . Smokeless tobacco: Never Used  Vaping Use  . Vaping Use: Never used  Substance Use Topics  . Alcohol use: Not Currently  . Drug use: Yes    Types: Marijuana     Allergies   Other, Nsaids, and Sulfa antibiotics   Review of Systems Review of Systems   Physical Exam Triage Vital Signs ED Triage Vitals  Enc Vitals Group     BP 10/25/19 1136 127/87     Pulse Rate 10/25/19 1136 64     Resp 10/25/19 1136 20     Temp 10/25/19 1136 98.1 F (36.7 C)     Temp Source 10/25/19 1136 Oral     SpO2 10/25/19 1136 100 %     Weight --      Height --      Head Circumference --      Peak Flow --      Pain Score 10/25/19 1134 8     Pain Loc --      Pain Edu? --      Excl. in GC? --    No data found.  Updated Vital Signs BP 127/87 (BP Location: Right Arm)   Pulse 64   Temp 98.1 F (36.7 C) (Oral)   Resp 20   SpO2 100%   Visual Acuity Right Eye Distance:   Left Eye Distance:   Bilateral Distance:    Right Eye Near:   Left Eye Near:    Bilateral Near:     Physical Exam Vitals and nursing note reviewed.  Constitutional:      General: She is not in acute  distress.    Appearance: She is well-developed. She is ill-appearing. She is not toxic-appearing.  HENT:     Head: Normocephalic and atraumatic.     Mouth/Throat:     Mouth: Mucous membranes are moist.     Pharynx: Oropharynx is clear. No oropharyngeal exudate.  Eyes:     Extraocular Movements: Extraocular movements intact.     Conjunctiva/sclera: Conjunctivae normal.     Pupils: Pupils are equal, round, and reactive to light.  Cardiovascular:     Rate and Rhythm: Normal rate and regular rhythm.     Heart sounds: No murmur heard.   Pulmonary:     Effort: Pulmonary effort is normal. No tachypnea or respiratory distress.     Breath sounds: Normal breath sounds. No decreased breath sounds, wheezing, rhonchi or rales.  Abdominal:     Palpations: Abdomen is soft.     Tenderness: There is no abdominal tenderness.  Musculoskeletal:     Cervical back: Normal range of motion and neck supple.  Lymphadenopathy:     Cervical: No cervical adenopathy.  Skin:    General: Skin is warm and dry.  Neurological:     Mental Status: She is alert.      UC Treatments / Results  Labs (all labs ordered are listed, but only abnormal results are displayed) Labs Reviewed  SARS CORONAVIRUS 2 (TAT 6-24 HRS) - Abnormal; Notable for the following components:      Result Value   SARS Coronavirus 2 POSITIVE (*)    All other components within normal limits  CULTURE, GROUP A STREP [facility])  POCT RAPID STREP A, ED / UC    EKG   Radiology No results found.  Procedures Procedures (including critical care time)  Medications Ordered in UC Medications  ondansetron (ZOFRAN-ODT) disintegrating tablet 4 mg (4 mg Oral Given 10/25/19 1146)    Initial Impression / Assessment and Plan / UC Course  I have reviewed the triage vital signs and the nursing notes.  Pertinent labs & imaging results that were available during my care of the patient were reviewed by me and considered in my medical decision making  (see chart for details).     #Viral illness #Close exposure to Covid Patient is 34 year old presenting with viral illness suspicious for Covid given close exposure.  She has normal vital signs, afebrile with good saturations.  Overall reassuring exam.  We will treat her symptomatically and encourage p.o. hydration.  Discussed return, follow-up and emergency department precautions.  Patient verbalized agreement understanding plan of care  Covid returned prior to note filing.  Patient was positive Final Clinical Impressions(s) / UC Diagnoses   Final diagnoses:  Viral illness  Close exposure to COVID-19 virus     Discharge Instructions     Take the medications as prescribed -Tylenol for headache, body ache and fever -Tessalon for cough -Flonase for congestion -Cepacol for sore throat -Use the Zofran for nausea and vomiting  Continue use your albuterol as needed  Encourage you to drink fluids to include electrolyte beverages, avoid reds  Consider over-the-counter Imodium if stool is not slowing, 1 to 2 tablets daily only.  If severe symptoms of shortness of breath, unable to tolerate fluids, high fevers, severe abdominal pain or other concerning symptoms go to emergency department  If your Covid-19 test is positive, you will receive a phone call from [facility] regarding your results. Negative test results are not called. Both positive and negative results area always visible on MyChart. If you do  not have a MyChart account, sign up instructions are in your discharge papers.   Persons who are directed to care for themselves at home may discontinue isolation under the following conditions:  . At least 10 days have passed since symptom onset and . At least 24 hours have passed without running a fever (this means without the use of fever-reducing medications) and . Other symptoms have improved.  Persons infected with COVID-19 who never develop symptoms may discontinue isolation  and other precautions 10 days after the date of their first positive COVID-19 test.        ED Prescriptions    Medication Sig Dispense Auth. Provider   ondansetron (ZOFRAN) 4 MG tablet Take 1 tablet (4 mg total) by mouth every 8 (eight) hours as needed for nausea or vomiting. 12 tablet Analisia Kingsford, Veryl SpeakJacob E, PA-C   benzonatate (TESSALON) 100 MG capsule Take 1 capsule (100 mg total) by mouth every 8 (eight) hours. 21 capsule Tanaka Gillen, Veryl SpeakJacob E, PA-C   acetaminophen (TYLENOL) 500 MG tablet Take 2 tablets (1,000 mg total) by mouth every 8 (eight) hours as needed for moderate pain or fever. 30 tablet Sherene Plancarte, Veryl SpeakJacob E, PA-C   fluticasone (FLONASE) 50 MCG/ACT nasal spray Place 1 spray into both nostrils daily. 15.8 mL Raymone Pembroke, Veryl SpeakJacob E, PA-C   Menthol (CEPACOL SORE THROAT) 5.4 MG LOZG Use as directed 1 lozenge (5.4 mg total) in the mouth or throat every 2 (two) hours as needed. 30 lozenge Manley Fason, Veryl SpeakJacob E, PA-C     PDMP not reviewed this encounter.   Hermelinda Medicusarr, Latima Hamza E, PA-C 10/25/19 2332

## 2019-10-25 NOTE — ED Triage Notes (Signed)
Pt c/o SOB, non-productive cough, congestion, runny nose, HA, sore throat x 3 days. N/v/d onset yesterday. Reports fever 101.3 this morning. Pt reports her husband tested positive for COVID on Wednesday. Used sumatriptan injection at 0100 for migraine, but no improvement. Took tylenol at 0930.

## 2019-10-27 LAB — CULTURE, GROUP A STREP (THRC)

## 2019-12-19 ENCOUNTER — Inpatient Hospital Stay (HOSPITAL_COMMUNITY)
Admission: EM | Admit: 2019-12-19 | Discharge: 2019-12-24 | DRG: 152 | Disposition: A | Discharge status: Home / Self Care | Payer: Medicaid Other | Attending: Student in an Organized Health Care Education/Training Program | Admitting: Student in an Organized Health Care Education/Training Program

## 2019-12-19 ENCOUNTER — Encounter (HOSPITAL_COMMUNITY): Payer: Self-pay | Admitting: Student in an Organized Health Care Education/Training Program

## 2019-12-19 ENCOUNTER — Other Ambulatory Visit: Payer: Self-pay

## 2019-12-19 ENCOUNTER — Emergency Department (HOSPITAL_COMMUNITY): Payer: Medicaid Other

## 2019-12-19 DIAGNOSIS — K1379 Other lesions of oral mucosa: Secondary | ICD-10-CM | POA: Diagnosis present

## 2019-12-19 DIAGNOSIS — G40209 Localization-related (focal) (partial) symptomatic epilepsy and epileptic syndromes with complex partial seizures, not intractable, without status epilepticus: Secondary | ICD-10-CM | POA: Diagnosis present

## 2019-12-19 DIAGNOSIS — R079 Chest pain, unspecified: Secondary | ICD-10-CM

## 2019-12-19 DIAGNOSIS — J45909 Unspecified asthma, uncomplicated: Secondary | ICD-10-CM | POA: Diagnosis present

## 2019-12-19 DIAGNOSIS — R091 Pleurisy: Secondary | ICD-10-CM | POA: Diagnosis not present

## 2019-12-19 DIAGNOSIS — R1310 Dysphagia, unspecified: Secondary | ICD-10-CM | POA: Diagnosis not present

## 2019-12-19 DIAGNOSIS — R202 Paresthesia of skin: Secondary | ICD-10-CM | POA: Diagnosis not present

## 2019-12-19 DIAGNOSIS — F419 Anxiety disorder, unspecified: Secondary | ICD-10-CM | POA: Diagnosis present

## 2019-12-19 DIAGNOSIS — J028 Acute pharyngitis due to other specified organisms: Secondary | ICD-10-CM | POA: Diagnosis not present

## 2019-12-19 DIAGNOSIS — Z87891 Personal history of nicotine dependence: Secondary | ICD-10-CM

## 2019-12-19 DIAGNOSIS — J129 Viral pneumonia, unspecified: Secondary | ICD-10-CM | POA: Diagnosis not present

## 2019-12-19 DIAGNOSIS — J029 Acute pharyngitis, unspecified: Secondary | ICD-10-CM

## 2019-12-19 DIAGNOSIS — E669 Obesity, unspecified: Secondary | ICD-10-CM | POA: Diagnosis present

## 2019-12-19 DIAGNOSIS — Z8616 Personal history of COVID-19: Secondary | ICD-10-CM

## 2019-12-19 DIAGNOSIS — Z6830 Body mass index (BMI) 30.0-30.9, adult: Secondary | ICD-10-CM | POA: Diagnosis not present

## 2019-12-19 DIAGNOSIS — R220 Localized swelling, mass and lump, head: Secondary | ICD-10-CM | POA: Diagnosis not present

## 2019-12-19 DIAGNOSIS — R070 Pain in throat: Secondary | ICD-10-CM | POA: Diagnosis present

## 2019-12-19 DIAGNOSIS — D573 Sickle-cell trait: Secondary | ICD-10-CM | POA: Diagnosis present

## 2019-12-19 DIAGNOSIS — M542 Cervicalgia: Secondary | ICD-10-CM | POA: Diagnosis not present

## 2019-12-19 DIAGNOSIS — F4310 Post-traumatic stress disorder, unspecified: Secondary | ICD-10-CM | POA: Diagnosis present

## 2019-12-19 LAB — BASIC METABOLIC PANEL
Anion gap: 11 (ref 5–15)
BUN: 12 mg/dL (ref 6–20)
CO2: 22 mmol/L (ref 22–32)
Calcium: 9.6 mg/dL (ref 8.9–10.3)
Chloride: 106 mmol/L (ref 98–111)
Creatinine, Ser: 1.04 mg/dL — ABNORMAL HIGH (ref 0.44–1.00)
GFR, Estimated: >60 mL/min (ref >60)
Glucose, Bld: 98 mg/dL (ref 70–99)
Potassium: 4 mmol/L (ref 3.5–5.1)
Sodium: 139 mmol/L (ref 135–145)

## 2019-12-19 LAB — CBC WITH DIFFERENTIAL/PLATELET
Abs Immature Granulocytes: 0.01 10*3/uL (ref 0.00–0.07)
Basophils Absolute: 0.1 10*3/uL (ref 0.0–0.1)
Basophils Relative: 1 %
Eosinophils Absolute: 0.4 10*3/uL (ref 0.0–0.5)
Eosinophils Relative: 7 %
HCT: 41.1 % (ref 36.0–46.0)
Hemoglobin: 13.8 g/dL (ref 12.0–15.0)
Immature Granulocytes: 0 %
Lymphocytes Relative: 42 %
Lymphs Abs: 2.6 10*3/uL (ref 0.7–4.0)
MCH: 30.4 pg (ref 26.0–34.0)
MCHC: 33.6 g/dL (ref 30.0–36.0)
MCV: 90.5 fL (ref 80.0–100.0)
Monocytes Absolute: 0.6 10*3/uL (ref 0.1–1.0)
Monocytes Relative: 10 %
Neutro Abs: 2.5 10*3/uL (ref 1.7–7.7)
Neutrophils Relative %: 40 %
Platelets: 236 10*3/uL (ref 150–400)
RBC: 4.54 MIL/uL (ref 3.87–5.11)
RDW: 12.6 % (ref 11.5–15.5)
WBC: 6.2 10*3/uL (ref 4.0–10.5)
nRBC: 0 % (ref 0.0–0.2)

## 2019-12-19 LAB — RESPIRATORY PANEL BY RT PCR (FLU A&B, COVID)
Influenza A by PCR: NEGATIVE
Influenza B by PCR: NEGATIVE
SARS Coronavirus 2 by RT PCR: NEGATIVE

## 2019-12-19 LAB — I-STAT BETA HCG BLOOD, ED (MC, WL, AP ONLY): I-stat hCG, quantitative: <5 m[IU]/mL (ref <5)

## 2019-12-19 LAB — GROUP A STREP BY PCR: Group A Strep by PCR: NOT DETECTED

## 2019-12-19 IMAGING — DX DG CHEST 1V PORT
1 series · 1 of 1 positions shown · non-contrast
Comparison: CT chest [DATE]

CLINICAL DATA: difficulty breathing, throat appears swollen on
exam. woke up like this today. pain in throat and chest. hx
asthma,sickle cell

EXAM:
PORTABLE CHEST 1 VIEW

[chest ap]
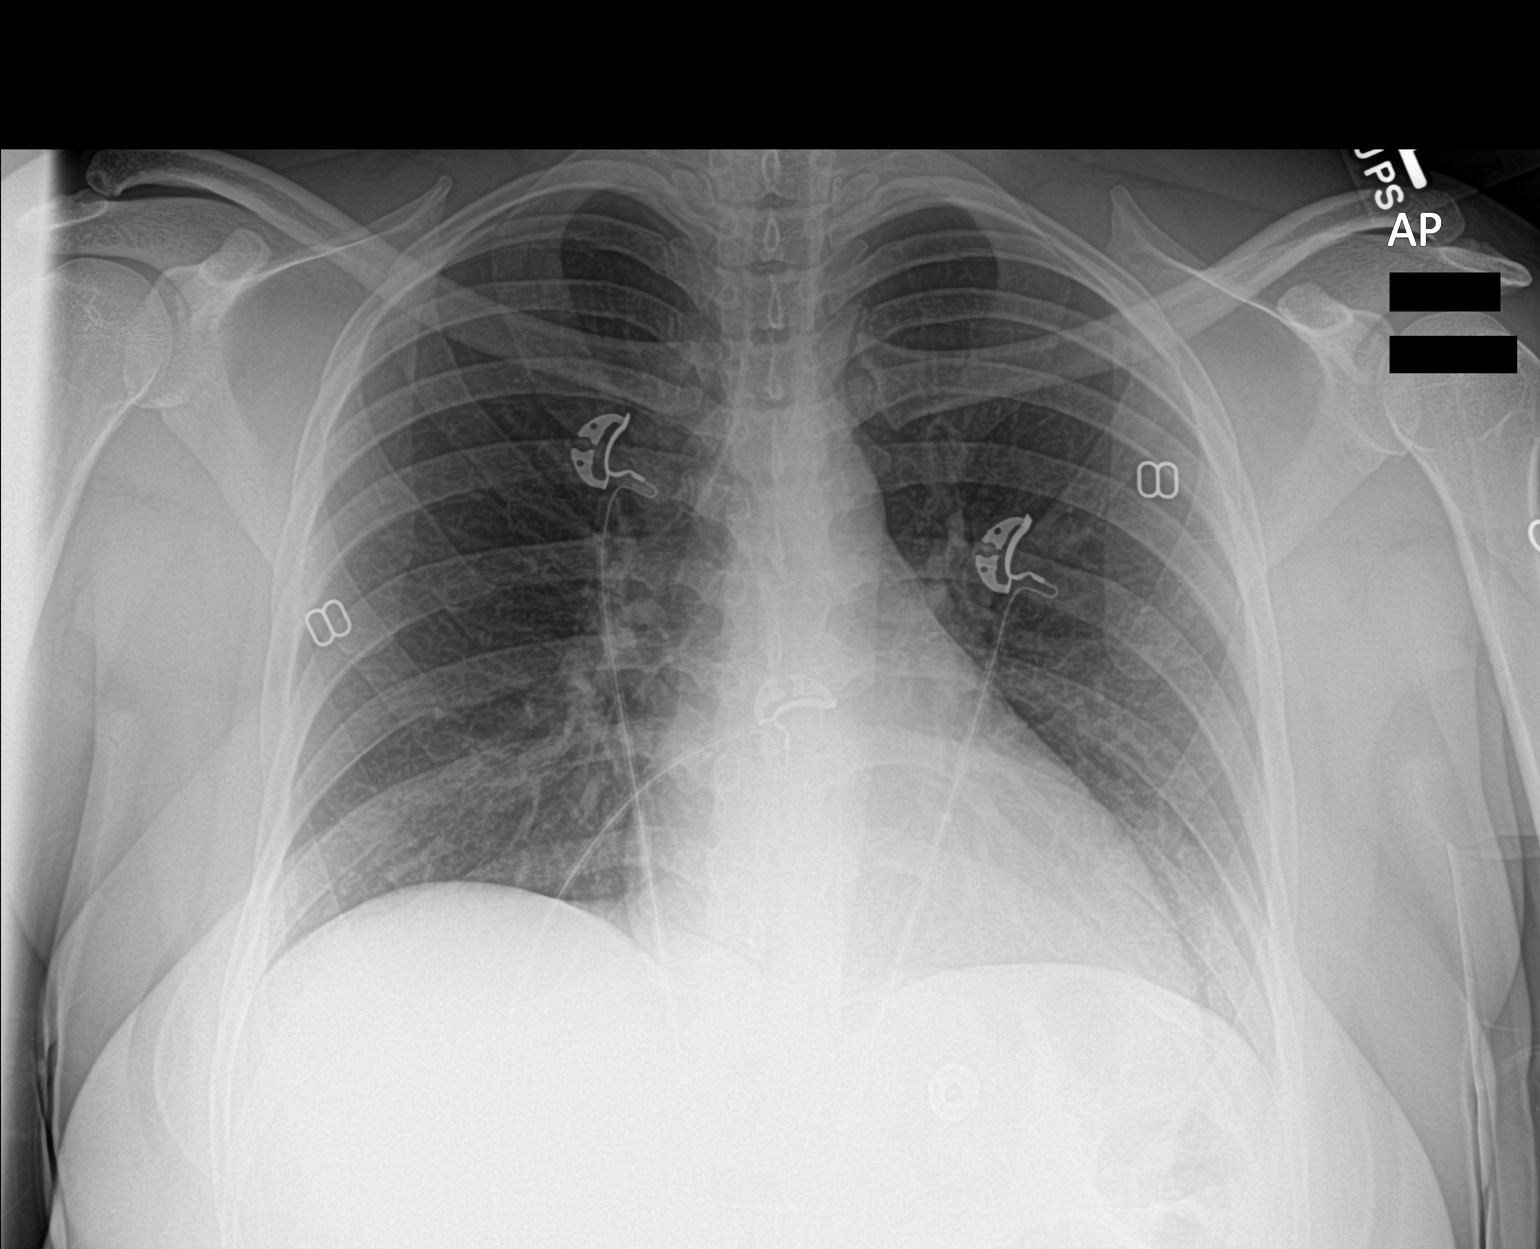

[1 of 1 positions shown; findings below may reference images not displayed]

FINDINGS: The cardiomediastinal contours are within normal limits. The lungs
are clear. No pneumothorax or significant pleural effusion. No acute
finding in the visualized skeleton.
IMPRESSION: No acute cardiopulmonary process.

## 2019-12-19 MED ORDER — MIRTAZAPINE 15 MG PO TABS
30.0000 mg | ORAL_TABLET | Freq: Every day | ORAL | Status: DC
  Administered 2019-12-19 – 2019-12-23 (×5): 30 mg via ORAL
  Filled 2019-12-19: qty 1
  Filled 2019-12-19 (×5): qty 2

## 2019-12-19 MED ORDER — LEVETIRACETAM 500 MG PO TABS
1000.0000 mg | ORAL_TABLET | Freq: Three times a day (TID) | ORAL | Status: DC
  Administered 2019-12-19: 1000 mg via ORAL
  Filled 2019-12-19 (×2): qty 2

## 2019-12-19 MED ORDER — METHYLPREDNISOLONE SODIUM SUCC 125 MG IJ SOLR
125.0000 mg | INTRAMUSCULAR | Status: DC
  Administered 2019-12-20 – 2019-12-21 (×2): 125 mg via INTRAVENOUS
  Filled 2019-12-19 (×2): qty 2

## 2019-12-19 MED ORDER — ONDANSETRON 4 MG PO TBDP
4.0000 mg | ORAL_TABLET | Freq: Three times a day (TID) | ORAL | Status: DC | PRN
  Administered 2019-12-19 – 2019-12-23 (×4): 4 mg via ORAL
  Filled 2019-12-19 (×4): qty 1

## 2019-12-19 MED ORDER — POLYETHYLENE GLYCOL 3350 17 G PO PACK: 17.0000 g | PACK | Freq: Every day | ORAL | Status: DC | PRN

## 2019-12-19 MED ORDER — DIPHENHYDRAMINE HCL 50 MG/ML IJ SOLN
INTRAMUSCULAR | Status: AC
  Administered 2019-12-19: 25 mg via INTRAVENOUS
  Filled 2019-12-19: qty 1

## 2019-12-19 MED ORDER — FAMOTIDINE 20 MG PO TABS
20.0000 mg | ORAL_TABLET | Freq: Once | ORAL | Status: DC
  Filled 2019-12-19: qty 1

## 2019-12-19 MED ORDER — LIDOCAINE VISCOUS HCL 2 % MT SOLN
15.0000 mL | OROMUCOSAL | Status: DC | PRN
  Administered 2019-12-20 – 2019-12-21 (×3): 15 mL via OROMUCOSAL
  Filled 2019-12-19 (×4): qty 15

## 2019-12-19 MED ORDER — CLINDAMYCIN PHOSPHATE 600 MG/50ML IV SOLN
600.0000 mg | Freq: Three times a day (TID) | INTRAVENOUS | Status: DC
  Administered 2019-12-19 – 2019-12-21 (×5): 600 mg via INTRAVENOUS
  Filled 2019-12-19 (×5): qty 50

## 2019-12-19 MED ORDER — EPINEPHRINE 0.3 MG/0.3ML IJ SOAJ
0.3000 mg | Freq: Once | INTRAMUSCULAR | Status: AC
Start: 2019-12-19 — End: 2019-12-19
  Administered 2019-12-19: 0.3 mg via INTRAMUSCULAR
  Filled 2019-12-19: qty 0.3

## 2019-12-19 MED ORDER — ESCITALOPRAM OXALATE 10 MG PO TABS
10.0000 mg | ORAL_TABLET | Freq: Every day | ORAL | Status: DC
  Administered 2019-12-19 – 2019-12-24 (×6): 10 mg via ORAL
  Filled 2019-12-19 (×6): qty 1

## 2019-12-19 MED ORDER — METHYLPREDNISOLONE SODIUM SUCC 125 MG IJ SOLR
125.0000 mg | Freq: Once | INTRAMUSCULAR | Status: AC
  Administered 2019-12-19: 125 mg via INTRAVENOUS
  Filled 2019-12-19: qty 2

## 2019-12-19 MED ORDER — LAMOTRIGINE 100 MG PO TABS
100.0000 mg | ORAL_TABLET | Freq: Every morning | ORAL | Status: DC
  Administered 2019-12-20 – 2019-12-24 (×5): 100 mg via ORAL
  Filled 2019-12-19 (×5): qty 1

## 2019-12-19 MED ORDER — PROCHLORPERAZINE EDISYLATE 10 MG/2ML IJ SOLN
10.0000 mg | Freq: Once | INTRAMUSCULAR | Status: AC
  Administered 2019-12-19: 10 mg via INTRAVENOUS
  Filled 2019-12-19: qty 2

## 2019-12-19 MED ORDER — FAMOTIDINE IN NACL 20-0.9 MG/50ML-% IV SOLN
20.0000 mg | Freq: Once | INTRAVENOUS | Status: AC
  Administered 2019-12-19: 20 mg via INTRAVENOUS
  Filled 2019-12-19: qty 50

## 2019-12-19 MED ORDER — LACTATED RINGERS IV BOLUS
1000.0000 mL | Freq: Once | INTRAVENOUS | Status: AC
  Administered 2019-12-19: 1000 mL via INTRAVENOUS

## 2019-12-19 MED ORDER — CLINDAMYCIN PHOSPHATE 900 MG/50ML IV SOLN
900.0000 mg | Freq: Once | INTRAVENOUS | Status: AC
  Administered 2019-12-19: 900 mg via INTRAVENOUS
  Filled 2019-12-19: qty 50

## 2019-12-19 MED ORDER — ENOXAPARIN SODIUM 40 MG/0.4ML ~~LOC~~ SOLN
40.0000 mg | SUBCUTANEOUS | Status: DC
  Administered 2019-12-19 – 2019-12-23 (×5): 40 mg via SUBCUTANEOUS
  Filled 2019-12-19 (×5): qty 0.4

## 2019-12-19 MED ORDER — ACETAMINOPHEN 650 MG RE SUPP: 650.0000 mg | Freq: Four times a day (QID) | RECTAL | Status: DC | PRN

## 2019-12-19 MED ORDER — LAMOTRIGINE 100 MG PO TABS
200.0000 mg | ORAL_TABLET | Freq: Every evening | ORAL | Status: DC
  Administered 2019-12-20 – 2019-12-23 (×3): 200 mg via ORAL
  Filled 2019-12-19 (×7): qty 2

## 2019-12-19 MED ORDER — DIPHENHYDRAMINE HCL 50 MG/ML IJ SOLN
25.0000 mg | Freq: Once | INTRAMUSCULAR | Status: AC
  Administered 2019-12-19: 25 mg via INTRAVENOUS
  Filled 2019-12-19: qty 1

## 2019-12-19 MED ORDER — DIPHENHYDRAMINE HCL 50 MG/ML IJ SOLN
25.0000 mg | Freq: Once | INTRAMUSCULAR | Status: AC
  Filled 2019-12-19: qty 1

## 2019-12-19 MED ORDER — HYDROXYZINE HCL 10 MG PO TABS
10.0000 mg | ORAL_TABLET | Freq: Two times a day (BID) | ORAL | Status: DC | PRN
  Administered 2019-12-19 – 2019-12-20 (×2): 10 mg via ORAL
  Filled 2019-12-19 (×2): qty 1

## 2019-12-19 MED ORDER — ACETAMINOPHEN 325 MG PO TABS
650.0000 mg | ORAL_TABLET | Freq: Four times a day (QID) | ORAL | Status: DC | PRN
  Administered 2019-12-20 – 2019-12-22 (×4): 650 mg via ORAL
  Filled 2019-12-19 (×4): qty 2

## 2019-12-19 MED ORDER — BUSPIRONE HCL 15 MG PO TABS
15.0000 mg | ORAL_TABLET | Freq: Two times a day (BID) | ORAL | Status: DC
  Administered 2019-12-19 – 2019-12-24 (×10): 15 mg via ORAL
  Filled 2019-12-19 (×11): qty 1

## 2019-12-19 MED ORDER — LEVETIRACETAM IN NACL 1000 MG/100ML IV SOLN
1000.0000 mg | Freq: Three times a day (TID) | INTRAVENOUS | Status: DC
  Administered 2019-12-19 – 2019-12-21 (×5): 1000 mg via INTRAVENOUS
  Filled 2019-12-19 (×5): qty 100

## 2019-12-19 NOTE — ED Provider Notes (Signed)
[facility] EMERGENCY DEPARTMENT Provider Note   CSN: [id number] Arrival date & time: 12/19/19  1148     History Chief Complaint  Patient presents with  . Sore Throat    Adrian Meshon Axelson is a 34 y.o. female.  Patient presents with shortness of breath, swelling in the back of her throat that started about 1 to 2 hours prior.  Positive for Covid about 2 months ago.  Denies any chest pain.  Does have allergies to mushrooms and NSAIDs.  The history is provided by the patient.  Sore Throat This is a new problem. The current episode started 1 to 2 hours ago. The problem occurs constantly. The problem has not changed since onset.Associated symptoms include shortness of breath. Pertinent negatives include no chest pain, no abdominal pain and no headaches. Nothing aggravates the symptoms. Nothing relieves the symptoms. She has tried nothing for the symptoms. The treatment provided no relief.       Past Medical History:  Diagnosis Date  . Anemia   . Anxiety   . Anxiety   . Asthma   . Blood dyscrasia    sickle trait  . Bronchitis, acute   . Depression   . Diverticular disease   . Family history of anesthesia complication    son due sleep apnea  . Family history of breast cancer   . Family history of prostate cancer   . Headache(784.0)    migraines  . PTSD (post-traumatic stress disorder)   . Shortness of breath    with exertion or panic attack  . Sickle cell anemia (HCC) trait   patient claims asymptomatic  . Sleep apnea    awaiting a sleep study to be done at [facility]    Patient Active Problem List   Diagnosis Date Noted  . Uvular swelling   . Genetic testing 02/23/2018  . Family history of breast cancer   . Family history of prostate cancer   . Tonsillar hypertrophy 10/07/2013    Past Surgical History:  Procedure Laterality Date  . BREAST SURGERY Bilateral    reductions  . CESAREAN SECTION  2010, [id number]   x 3  . EYE SURGERY     x 10 as a  child  . TONSILLECTOMY N/A 10/07/2013   Procedure: TONSILLECTOMY;  Surgeon: Christia Reading, MD;  Location: [facility] OR;  Service: ENT;  Laterality: N/A;  . TUMOR REMOVAL     tumor removed from back     OB History   No obstetric history on file.     Family History  Problem Relation Age of Onset  . Breast cancer Mother   . Breast cancer Maternal Aunt 36       metastatic  . Cancer Maternal Uncle        unk type  . Breast cancer Maternal Grandmother   . Prostate cancer Maternal Grandfather        metastatic  . Cancer Maternal Aunt        either breast or ovarian     Social History   Tobacco Use  . Smoking status: Former Smoker    Packs/day: 0.25    Years: 1.00    Pack years: 0.25    Types: Cigarettes    Quit date: 07/02/2013    Years since quitting: 6.4  . Smokeless tobacco: Never Used  Vaping Use  . Vaping Use: Never used  Substance Use Topics  . Alcohol use: Not Currently  . Drug use: Yes    Types:  Marijuana    Home Medications Prior to Admission medications   Medication Sig Start Date End Date Taking? Authorizing Provider  acetaminophen (TYLENOL) 500 MG tablet Take 2 tablets (1,000 mg total) by mouth every 8 (eight) hours as needed for moderate pain or fever. 10/25/19   Darr, Gerilyn Pilgrim, PA-C  albuterol (PROVENTIL HFA;VENTOLIN HFA) 108 (90 Base) MCG/ACT inhaler Inhale 2 puffs into the lungs every 4 (four) hours as needed for wheezing or shortness of breath. 04/10/15   Cheri Fowler, PA-C  aspirin 81 MG chewable tablet Chew by mouth.    [provider]  benzonatate (TESSALON) 100 MG capsule Take 1 capsule (100 mg total) by mouth every 8 (eight) hours. 10/25/19   Darr, Gerilyn Pilgrim, PA-C  budesonide-formoterol (SYMBICORT) 160-4.5 MCG/ACT inhaler Inhale into the lungs.    [provider]  cefdinir (OMNICEF) 300 MG capsule Take 1 capsule (300 mg total) by mouth 2 (two) times daily. 08/17/19   Mardella Layman, MD  escitalopram (LEXAPRO) 20 MG tablet Take by mouth.    [provider]  fluticasone (FLONASE) 50 MCG/ACT nasal spray Place 1 spray into both nostrils daily. 10/25/19   Darr, Gerilyn Pilgrim, PA-C  HYDROcodone-acetaminophen (NORCO/VICODIN) 5-325 MG tablet Take 1 tablet by mouth every 6 (six) hours as needed for moderate pain or severe pain. 08/17/19   Mardella Layman, MD  levonorgestrel (MIRENA) 20 MCG/24HR IUD by Intrauterine route. 01/06/13   [provider]  Menthol (CEPACOL SORE THROAT) 5.4 MG LOZG Use as directed 1 lozenge (5.4 mg total) in the mouth or throat every 2 (two) hours as needed. 10/25/19   Darr, Gerilyn Pilgrim, PA-C  ondansetron (ZOFRAN) 4 MG tablet Take 1 tablet (4 mg total) by mouth every 8 (eight) hours as needed for nausea or vomiting. 10/25/19   Darr, Gerilyn Pilgrim, PA-C  SUMAtriptan (IMITREX) 100 MG tablet Take by mouth.    [provider]  SUMAtriptan (IMITREX) 20 MG/ACT nasal spray Place into the nose.    [provider]  SUMAtriptan Succinate Refill 6 MG/0.5ML SOCT Inject into the skin.    [provider]    Allergies    Other, Nsaids, and Sulfa antibiotics  Review of Systems   Review of Systems  Constitutional: Negative for chills and fever.  HENT: Positive for sore throat, trouble swallowing and voice change. Negative for ear pain.   Eyes: Negative for pain and visual disturbance.  Respiratory: Positive for shortness of breath. Negative for cough.   Cardiovascular: Negative for chest pain and palpitations.  Gastrointestinal: Negative for abdominal pain and vomiting.  Genitourinary: Negative for dysuria and hematuria.  Musculoskeletal: Negative for arthralgias and back pain.  Skin: Negative for color change and rash.  Neurological: Negative for seizures, syncope and headaches.  All other systems reviewed and are negative.   Physical Exam Updated Vital Signs  ED Triage Vitals [12/19/19 1154]  Enc Vitals Group     BP (!) 139/102     Pulse Rate 82     Resp 18     Temp 98.6 F (37 C)     Temp Source Oral      SpO2 99 %     Weight 187 lb (84.8 kg)     Height 5\' 6"  (1.676 m)     Head Circumference      Peak Flow      Pain Score 6     Pain Loc      Pain Edu?      Excl. in GC?  Physical Exam Vitals and nursing note reviewed.  Constitutional:      General: She is not in acute distress.    Appearance: She is well-developed. She is not ill-appearing.  HENT:     Head: Normocephalic and atraumatic.     Right Ear: Tympanic membrane normal.     Left Ear: Tympanic membrane normal.     Mouth/Throat:     Pharynx: Uvula midline. Pharyngeal swelling, posterior oropharyngeal erythema and uvula swelling present.     Tonsils: No tonsillar exudate.     Comments: Swelling of uvula and soft palate, tonsils absent, tongue is on swollen as well as the floor the mouth is soft, lips with no swelling, patient does have some hoarse voice, no trismus, no drooling Eyes:     Conjunctiva/sclera: Conjunctivae normal.     Pupils: Pupils are equal, round, and reactive to light.  Cardiovascular:     Rate and Rhythm: Normal rate and regular rhythm.     Heart sounds: No murmur heard.   Pulmonary:     Effort: Pulmonary effort is normal. No respiratory distress.     Breath sounds: Normal breath sounds.  Abdominal:     Palpations: Abdomen is soft.     Tenderness: There is no abdominal tenderness.  Musculoskeletal:     Cervical back: Normal range of motion and neck supple.  Skin:    General: Skin is warm and dry.     Capillary Refill: Capillary refill takes less than 2 seconds.  Neurological:     General: No focal deficit present.     Mental Status: She is alert.     ED Results / Procedures / Treatments   Labs (all labs ordered are listed, but only abnormal results are displayed) Labs Reviewed  BASIC METABOLIC PANEL - Abnormal; Notable for the following components:      Result Value   Creatinine, Ser 1.04 (*)    All other components within normal limits  RESPIRATORY PANEL BY RT PCR (FLU A&B, COVID)  CBC  WITH DIFFERENTIAL/PLATELET  I-STAT BETA HCG BLOOD, ED ([facility], [facility], [facility] ONLY)    EKG None  Radiology DG Chest Port 1 View  Result Date: 12/19/2019 CLINICAL DATA:  difficulty breathing, throat appears swollen on exam. woke up like this today. pain in throat and chest. hx asthma,sickle cell EXAM: PORTABLE CHEST 1 VIEW COMPARISON:  CT chest 02/08/2019 FINDINGS: The cardiomediastinal contours are within normal limits. The lungs are clear. No pneumothorax or significant pleural effusion. No acute finding in the visualized skeleton. IMPRESSION: No acute cardiopulmonary process. Electronically Signed   By: Emmaline Kluver M.D.   On: 12/19/2019 12:53    Procedures Procedures (including critical care time)  Medications Ordered in ED Medications  clindamycin (CLEOCIN) IVPB 900 mg (has no administration in time range)  EPINEPHrine (EPI-PEN) injection 0.3 mg (0.3 mg Intramuscular Given 12/19/19 1227)  diphenhydrAMINE (BENADRYL) injection 25 mg (25 mg Intravenous Given 12/19/19 1235)  methylPREDNISolone sodium succinate (SOLU-MEDROL) 125 mg/2 mL injection 125 mg (125 mg Intravenous Given 12/19/19 1231)  famotidine (PEPCID) IVPB 20 mg premix (0 mg Intravenous Stopped 12/19/19 1315)    ED Course  I have reviewed the triage vital signs and the nursing notes.  Pertinent labs & imaging results that were available during my care of the patient were reviewed by me and considered in my medical decision making (see chart for details).    MDM Rules/Calculators/A&P  Voncile Meshon Thressa ShellerRolle is a 34 year old female with history of asthma who presents to the ED with trouble breathing.  Normal vitals.  No fever.  Has significant swelling to the uvula.  Has no tonsils.  No signs to suggest ludwigs.  There is no stridor or wheezing.  No hives.  Patient did have Covid diagnosed about 2 months ago.  Has not been vaccinated since.  Talked with Dr. Elijah Birkaldwell with ENT who came down to the ED to evaluate  the patient.  He did a laryngoscopy and overall swelling is focal to the uvula and soft palate.  There is no lower airway involvement and no concern for airway compromise.  Overall suspect infectious process and will start IV clindamycin.  However will continue IV steroids as well in case there is allergic component.  Overall lab work was unremarkable patient to be medicine for further care and evaluation.  Hemodynamically stable throughout my care.  This chart was dictated using voice recognition software.  Despite best efforts to proofread,  errors can occur which can change the documentation meaning.    Final Clinical Impression(s) / ED Diagnoses Final diagnoses:  Uvular swelling  Viral pharyngitis    Rx / DC Orders ED Discharge Orders    None       Virgina NorfolkCuratolo, Merrel Crabbe, DO 12/19/19 1335

## 2019-12-19 NOTE — H&P (Signed)
Date: 12/19/2019               Patient Name:  Leah Shea MRN: [id number]  DOB: [birth year] Age / Sex: 34 y.o., female   [facility]         Medical Service: Internal Medicine Teaching Service         Attending Physician: Dr. Oswaldo Done, Marquita Palms, *    First Contact: Dr. Evlyn Kanner Pager: [phone]  Second Contact: Dr. Eliezer Bottom Pager: [phone]       After Hours (After 5p/  First Contact Pager: [phone]  weekends / holidays): Second Contact Pager: [phone]   Chief Complaint: throat pain and swelling  History of Present Illness: Leah Shea is a 34 year old female with PMHx of PTSD, anxiety, partial complex seizure disorder, and sickle cell trait presenting with one day history of "throat pain and swelling". History obtained by patient and husband at bedside. Patient has had flu-like symptoms of sneezing, cough, congestion and subjective fevers/chills over the past week but noted that this morning, she had throat pain and swelling. She notes that it is difficult to swallow and as a result, she has not had much oral intake. She denies any recent medicine changes, dental procedures, or consumption of shellfish, mushrooms, NSAIDs or sulfa antibiotics. No prior history of similar events.  Of note, she was positive for COVID two months prior. However, did not require hospitalization and improved with symptomatic treatment at home.   ED Course:  Patient noted to have significant swelling to the uvula. Afebrile and without leukocytosis noted on exam. ENT was consulted and patient had laryngoscopy - noted to have focal uvula and soft palate swelling without lower airway involvement. Patient given one dose of IV clindamycin and solumedrol. She was admitted to internal medicine for further evaluation and management.   Meds:  Current Meds  Medication Sig  . acetaminophen (TYLENOL) 500 MG tablet Take 2 tablets (1,000 mg total) by mouth every 8 (eight) hours  as needed for moderate pain or fever.  . busPIRone (BUSPAR) 15 MG tablet Take 15 mg by mouth 2 (two) times daily.  . cyclobenzaprine (FLEXERIL) 10 MG tablet Take 10 mg by mouth at bedtime.  Marland Kitchen escitalopram (LEXAPRO) 10 MG tablet Take 10 mg by mouth daily.  . hydrOXYzine (ATARAX/VISTARIL) 10 MG tablet Take 10 mg by mouth 2 (two) times daily as needed for anxiety.  . lamoTRIgine (LAMICTAL) 200 MG tablet Take 100-200 mg by mouth See admin instructions. 100mg  in the morning 200mg  in the evening  . levETIRAcetam (KEPPRA) 1000 MG tablet Take 1,000 mg by mouth 3 (three) times daily.  . mirtazapine (REMERON) 30 MG tablet Take 30 mg by mouth at bedtime.  . nicotine polacrilex (NICORETTE) 4 MG gum Take 4 mg by mouth as needed for smoking cessation.  . norethindrone (MICRONOR) 0.35 MG tablet Take 1 tablet by mouth daily.  . Prenatal Vit-Fe Fumarate-FA (PRENATAL PO) Take 1 tablet by mouth daily.   Allergies: Allergies as of 12/19/2019 - Review Complete 12/19/2019  Allergen Reaction Noted  . Other Anaphylaxis and Hives 10/07/2013  . Nsaids Hives, Swelling, and Other (See Comments) 10/01/2013  . Sulfa antibiotics  03/06/2014   Past Medical History:  Diagnosis Date  . Anemia   . Anxiety   . Anxiety   . Asthma   . Blood dyscrasia    sickle trait  . Bronchitis, acute   . Depression   . Diverticular disease   .  Family history of anesthesia complication    son due sleep apnea  . Family history of breast cancer   . Family history of prostate cancer   . Headache(784.0)    migraines  . PTSD (post-traumatic stress disorder)   . Shortness of breath    with exertion or panic attack  . Sickle cell anemia (HCC) trait   patient claims asymptomatic  . Sleep apnea    awaiting a [facility] to be [facility]    Family History:  Family History  Problem Relation Age of Onset  . Breast cancer Mother   . Breast cancer Maternal Aunt 36       metastatic  . Cancer Maternal Uncle        unk type  .  Breast cancer Maternal Grandmother   . Prostate cancer Maternal Grandfather        metastatic  . Cancer Maternal Aunt        either breast or ovarian     Surgical History: Past Surgical History:  Procedure Laterality Date  . BREAST SURGERY Bilateral    reductions  . CESAREAN SECTION  2010, [id number]   x 3  . EYE SURGERY     x 10 as a child  . TONSILLECTOMY N/A 10/07/2013   Procedure: TONSILLECTOMY;  Surgeon: Christia Reading, MD;  Location: [facility] OR;  Service: ENT;  Laterality: N/A;  . TUMOR REMOVAL     tumor removed from back   Social History:  Patient is a Cytogeneticist. Lives at home with her husband She does have a tobacco use history but quit 6 ears ago. She reports currently smoking marijuana daily. She endorses occasional alcohol use.   Review of Systems: A complete ROS was negative except as per HPI.   Physical Exam: Blood pressure 134/87, pulse 67, temperature 98.6 F (37 C), temperature source Oral, resp. rate 19, height 5\' 6"  (1.676 m), weight 84.8 kg, SpO2 95 %. Physical Exam  Constitutional: Appears well-developed and well-nourished. No distress.  HENT: Normocephalic and atraumatic, EOMI, conjunctiva normal, moist mucous membranes; significant uvular edema on examination without pooling of saliva; significantly tender cervical and submandibular lymphadenopathy  Cardiovascular: Normal rate, regular rhythm, S1 and S2 present, no murmurs, rubs, gallops.  Distal pulses intact Respiratory: No respiratory distress, no accessory muscle use.  Effort is normal.  Lungs are clear to auscultation bilaterally. Musculoskeletal: Normal bulk and tone.  No peripheral edema noted. Neurological: Is alert and oriented x4, no apparent focal deficits noted. Skin: Warm and dry.  No rash, erythema, lesions noted. Psychiatric: Normal mood and affect. Behavior is normal. Judgment and thought content normal.    EKG: pending  CXR: personally reviewed my interpretation is no active cardiopulmonary  disease.  Assessment & Plan by Problem: Active Problems:   Acute pharyngitis  Leah Shea is a 34 year old female with PMHx of PTSD/anxiety and complex partial seizures presenting with acute pharyngitis and uvular edema.   Acute pharyngitis Uvular edema:  Patient is presenting with one day history of throat pain and swelling, concerning for airway compromise. She endorses a one week history of upper respiratory infection symptoms with recent COVID infection ~2 months ago. Due to concerns for possible airway compromise, ENT was consulted in the ED and patient had laryngoscopy which demonstrated significant lymphoid hyperplasia and erythematous changes of the nasopharynx, suggestive of infection. Soft palate noted to be symmetrically swollen and erythematous. She did have recent COVID infection; however, repeat COVID testing is negative. She is  afebrile and without leukocytosis on exam. Received one dose of IV clindamycin and steroids. She continues to have difficulty with swallowing. At this time, suspect likely infectious etiology such as possible cellulitis vs group A strep infection. No signs of Ludwig angina at this time.  - Group A strep PCR testing  - Continue IV clindamycin 600mg  q8h  - Continue IV solumedrol q24h - Continue to monitor for any signs of airway compromise   Hx of complex partial seizures Patient endorses history of complex partial seizures for which she is on lamictal and keppra.  - Continue lamictal 100mg  qAM and 200mg  PM - Continue Keppra 1000mg  tid   Hx of PTSD/Anxiety Patient is on multiple anxiolytic and anti-anxiety medications.  - Continue buspar 15mg  bid  - Continue lexapro 10mg  daily - Continue hydroxyzine 10mg  bid prn  - Continue remeron 30mg  qHS   Dispo: Admit patient to Inpatient with expected length of stay greater than 2 midnights.  Signed: , MD  [facility] PGY-2 12/19/2019, 5:48 PM  Pager: [phone] After 5pm on weekdays and 1pm on  weekends: On Call pager: [phone]

## 2019-12-19 NOTE — ED Triage Notes (Signed)
Pt arrives with difficulty breathing, throat appears swollen on exam. Speaking very softly. Reports she woke up like this today. Endorses pain in throat and chest.

## 2019-12-19 NOTE — Consult Note (Signed)
Reason for Consult: Throat swelling Referring Physician: ER attending  Leah Shea is an 34 y.o. female.  HPI: Patient is an unvaccinated 34 year old female who presents today with upper airway swelling, changes in voice, and overall malaise.  She was Covid positive just over 2 months ago.  Associated symptoms today include cough.  I was consulted to evaluate her larynx for any evidence of swelling or edema given the swelling noted on oropharyngeal exam involving the palate.  She just got she has pharyngitis  Past Medical History:  Diagnosis Date  . Anemia   . Anxiety   . Anxiety   . Asthma   . Blood dyscrasia    sickle trait  . Bronchitis, acute   . Depression   . Diverticular disease   . Family history of anesthesia complication    son due sleep apnea  . Family history of breast cancer   . Family history of prostate cancer   . Headache(784.0)    migraines  . PTSD (post-traumatic stress disorder)   . Shortness of breath    with exertion or panic attack  . Sickle cell anemia (HCC) trait   patient claims asymptomatic  . Sleep apnea    awaiting a sleep study to be done at [facility]    Past Surgical History:  Procedure Laterality Date  . BREAST SURGERY Bilateral    reductions  . CESAREAN SECTION  2010, [id number]   x 3  . EYE SURGERY     x 10 as a child  . TONSILLECTOMY N/A 10/07/2013   Procedure: TONSILLECTOMY;  Surgeon: Christia Reading, MD;  Location: [facility] OR;  Service: ENT;  Laterality: N/A;  . TUMOR REMOVAL     tumor removed from back    Family History  Problem Relation Age of Onset  . Breast cancer Mother   . Breast cancer Maternal Aunt 36       metastatic  . Cancer Maternal Uncle        unk type  . Breast cancer Maternal Grandmother   . Prostate cancer Maternal Grandfather        metastatic  . Cancer Maternal Aunt        either breast or ovarian     Social History:  reports that she quit smoking about 6 years ago. Her smoking use included cigarettes. She has a  0.25 pack-year smoking history. She has never used smokeless tobacco. She reports previous alcohol use. She reports current drug use. Drug: Marijuana.  Allergies:  Allergies  Allergen Reactions  . Other Anaphylaxis and Hives    Mushrooms  . Nsaids Hives, Swelling and Other (See Comments)    Body burns   . Sulfa Antibiotics     burns    Medications: I have reviewed the patient's current medications.  Results for orders placed or performed during the hospital encounter of 12/19/19 (from the past 48 hour(s))  CBC with Differential     Status: None   Collection Time: 12/19/19 12:29 PM  Result Value Ref Range   WBC 6.2 4.0 - 10.5 K/uL   RBC 4.54 3.87 - 5.11 MIL/uL   Hemoglobin 13.8 12.0 - 15.0 g/dL   HCT 66.0 36 - 46 %   MCV 90.5 80.0 - 100.0 fL   MCH 30.4 26.0 - 34.0 pg   MCHC 33.6 30.0 - 36.0 g/dL   RDW 63.0 16.0 - 10.9 %   Platelets 236 150 - 400 K/uL   nRBC 0.0 0.0 - 0.2 %  Neutrophils Relative % 40 %   Neutro Abs 2.5 1.7 - 7.7 K/uL   Lymphocytes Relative 42 %   Lymphs Abs 2.6 0.7 - 4.0 K/uL   Monocytes Relative 10 %   Monocytes Absolute 0.6 0.1 - 1.0 K/uL   Eosinophils Relative 7 %   Eosinophils Absolute 0.4 0.0 - 0.5 K/uL   Basophils Relative 1 %   Basophils Absolute 0.1 0.0 - 0.1 K/uL   Immature Granulocytes 0 %   Abs Immature Granulocytes 0.01 0.00 - 0.07 K/uL    Comment: Performed at [facility], [address], [place], [place] [address]  Basic metabolic panel     Status: Abnormal   Collection Time: 12/19/19 12:29 PM  Result Value Ref Range   Sodium 139 135 - 145 mmol/L   Potassium 4.0 3.5 - 5.1 mmol/L   Chloride 106 98 - 111 mmol/L   CO2 22 22 - 32 mmol/L   Glucose, Bld 98 70 - 99 mg/dL    Comment: Glucose reference range applies only to samples taken after fasting for at least 8 hours.   BUN 12 6 - 20 mg/dL   Creatinine, Ser 1.63 (H) 0.44 - 1.00 mg/dL   Calcium 9.6 8.9 - 84.6 mg/dL   GFR, Estimated >65 >99 mL/min    Comment: (NOTE) Calculated  using the CKD-EPI Creatinine Equation (2021)    Anion gap 11 5 - 15    Comment: Performed at [facility], [address], [place], [place] [address]    DG Chest Port 1 View  Result Date: 12/19/2019 CLINICAL DATA:  difficulty breathing, throat appears swollen on exam. woke up like this today. pain in throat and chest. hx asthma,sickle cell EXAM: PORTABLE CHEST 1 VIEW COMPARISON:  CT chest 02/08/2019 FINDINGS: The cardiomediastinal contours are within normal limits. The lungs are clear. No pneumothorax or significant pleural effusion. No acute finding in the visualized skeleton. IMPRESSION: No acute cardiopulmonary process. Electronically Signed   By: Emmaline Kluver M.D.   On: 12/19/2019 12:53    Review of Systems  See above  Blood pressure (!) 153/100, pulse 88, temperature 98.6 F (37 C), temperature source Oral, resp. rate 17, height 5\' 6"  (1.676 m), weight 84.8 kg, SpO2 96 %. Physical Exam  Alert and oriented x 3/difficulty carrying on a complete sentence due to shortness of breath Face atraumatic without bony step-offs or sinus tenderness Nose without mucosal change or lesions Pinna normal without mastoid tenderness No facial asymmetry/cranial nerves intact Pupils normal and reactive/gaze conjugate Skin without suspicious lesions or rashes Neck without cervical adenopathy/trachea midline Chest symmetric expansions bilaterally without use of accessory muscles OC/OP -patient exhibits normal tongue mobility in the absence of floor of mouth swelling.  Her lips are normal.  She has bilateral soft palate swelling and erythema consistent with an infectious process.  No exudates and tonsils are absent.  Normal tongue mobility  FFOL -scope was passed to the left naris into the nasopharynx without difficulty.  She was noted to have significant lymphoid hyperplasia and erythematous changes involving the entirety of the nasopharynx suggestive of an infectious process.  The soft palate  was symmetrically swollen and erythematous.  The lateral hypopharyngeal walls exhibited lymphoid hyperplasia.  The airway itself was uninvolved.  The true vocal cords move normally and there was no redness or edema seen.  Supraglottis was normal.  The epiglottis was crisp and normal in appearance.  The piriform sinuses were clear.  The base of tongue was  symmetric.  Assessment/Plan:  Acute pharyngitis with palatal and nasopharyngeal edema  This looks like an infectious process and not inflammatory.  I would treat her as if this is infectious but also cover her with steroids given the possibility of an allergic reaction.  My guess is that this represents a new Covid infection and should be treated accordingly.  There is no evidence of airway compromise and intubation is not necessary at this time.  Rejeana Brock 12/19/2019, 1:26 PM

## 2019-12-20 ENCOUNTER — Inpatient Hospital Stay (HOSPITAL_COMMUNITY): Payer: Medicaid Other

## 2019-12-20 DIAGNOSIS — G40209 Localization-related (focal) (partial) symptomatic epilepsy and epileptic syndromes with complex partial seizures, not intractable, without status epilepticus: Secondary | ICD-10-CM

## 2019-12-20 DIAGNOSIS — F4310 Post-traumatic stress disorder, unspecified: Secondary | ICD-10-CM

## 2019-12-20 DIAGNOSIS — M542 Cervicalgia: Secondary | ICD-10-CM

## 2019-12-20 DIAGNOSIS — R202 Paresthesia of skin: Secondary | ICD-10-CM

## 2019-12-20 DIAGNOSIS — F419 Anxiety disorder, unspecified: Secondary | ICD-10-CM

## 2019-12-20 DIAGNOSIS — R220 Localized swelling, mass and lump, head: Secondary | ICD-10-CM

## 2019-12-20 DIAGNOSIS — Z8616 Personal history of COVID-19: Secondary | ICD-10-CM

## 2019-12-20 DIAGNOSIS — J029 Acute pharyngitis, unspecified: Secondary | ICD-10-CM

## 2019-12-20 LAB — CBC
HCT: 38.4 % (ref 36.0–46.0)
Hemoglobin: 13 g/dL (ref 12.0–15.0)
MCH: 30.4 pg (ref 26.0–34.0)
MCHC: 33.9 g/dL (ref 30.0–36.0)
MCV: 89.9 fL (ref 80.0–100.0)
Platelets: 216 10*3/uL (ref 150–400)
RBC: 4.27 MIL/uL (ref 3.87–5.11)
RDW: 12.5 % (ref 11.5–15.5)
WBC: 7.9 10*3/uL (ref 4.0–10.5)
nRBC: 0 % (ref 0.0–0.2)

## 2019-12-20 LAB — COMPREHENSIVE METABOLIC PANEL
ALT: 11 U/L (ref 0–44)
AST: 11 U/L — ABNORMAL LOW (ref 15–41)
Albumin: 3.4 g/dL — ABNORMAL LOW (ref 3.5–5.0)
Alkaline Phosphatase: 65 U/L (ref 38–126)
Anion gap: 13 (ref 5–15)
BUN: 11 mg/dL (ref 6–20)
CO2: 20 mmol/L — ABNORMAL LOW (ref 22–32)
Calcium: 9.4 mg/dL (ref 8.9–10.3)
Chloride: 106 mmol/L (ref 98–111)
Creatinine, Ser: 0.87 mg/dL (ref 0.44–1.00)
GFR, Estimated: >60 mL/min (ref >60)
Glucose, Bld: 122 mg/dL — ABNORMAL HIGH (ref 70–99)
Potassium: 3.9 mmol/L (ref 3.5–5.1)
Sodium: 139 mmol/L (ref 135–145)
Total Bilirubin: 0.5 mg/dL (ref 0.3–1.2)
Total Protein: 6.4 g/dL — ABNORMAL LOW (ref 6.5–8.1)

## 2019-12-20 LAB — MONONUCLEOSIS SCREEN: Mono Screen: NEGATIVE

## 2019-12-20 LAB — HIV ANTIBODY (ROUTINE TESTING W REFLEX): HIV Screen 4th Generation wRfx: NONREACTIVE

## 2019-12-20 LAB — GLUCOSE, CAPILLARY: Glucose-Capillary: 214 mg/dL — ABNORMAL HIGH (ref 70–99)

## 2019-12-20 IMAGING — CT CT NECK W/ CM
3 of 6 series · 12 of 33 positions shown, 14 images · IV contrast (APPLIED)
Comparison: None.

CLINICAL DATA: Neck cellulitis. Acute pharyngitis with uvular
edema.

EXAM:
CT NECK WITH CONTRAST
TECHNIQUE: Multidetector CT imaging of the neck was performed using the
standard protocol following the bolus administration of intravenous
contrast.
CONTRAST:  75mL OMNIPAQUE IOHEXOL 350 MG/ML SOLN

[Series 5: 1.0 thins · axial · 0.34mm/px · z∈[-300,-168]mm · 4 of 222 slices shown, 5 images]
[im 45/222  soft-tissue]
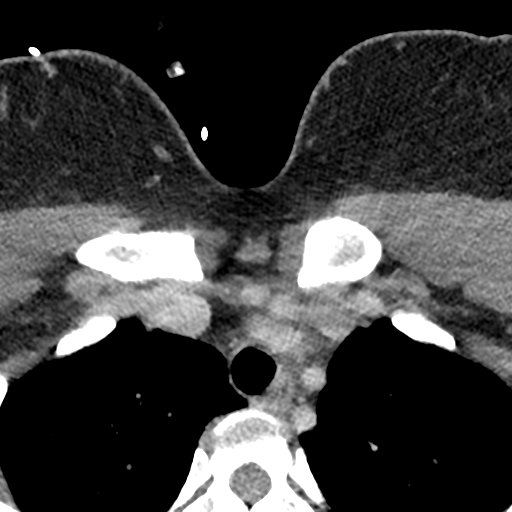
[im 45/222  bone]
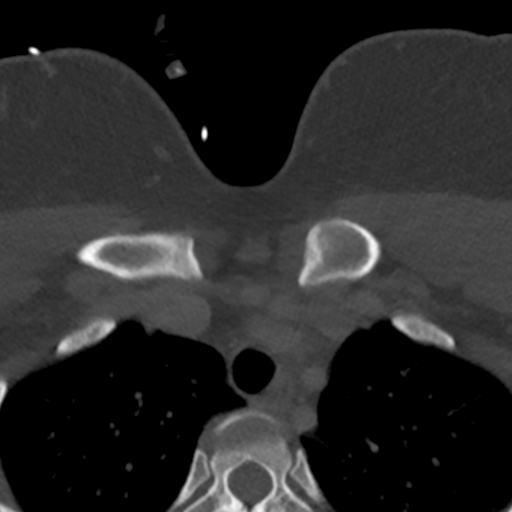
[im 89/222  bone]
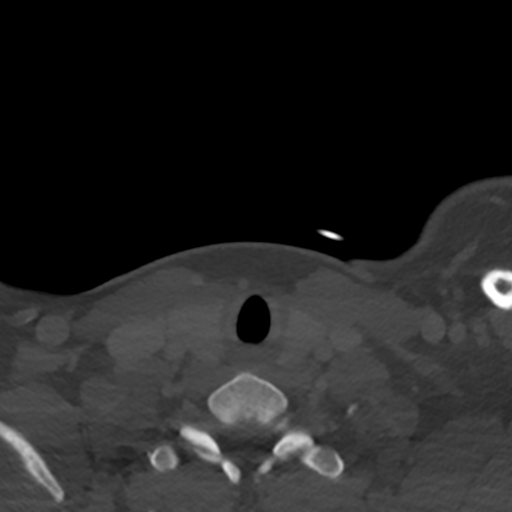
[im 133/222  bone]
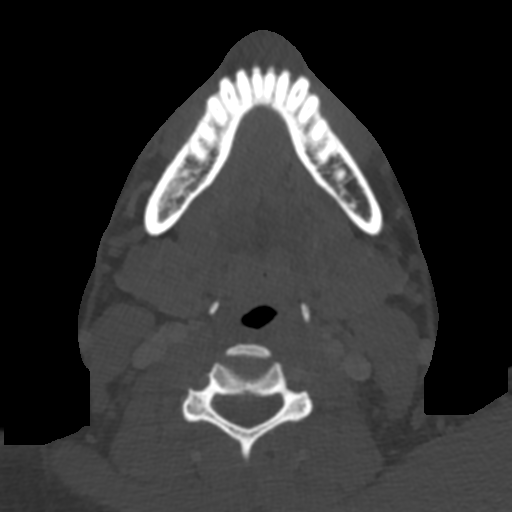
[im 177/222  bone]
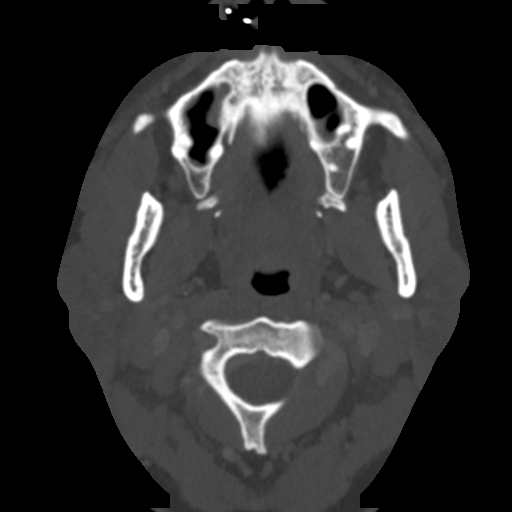

[Series 6: coronal st · coronal · 0.43mm/px · 3 of 101 slices shown]
[im 21/101  bone]
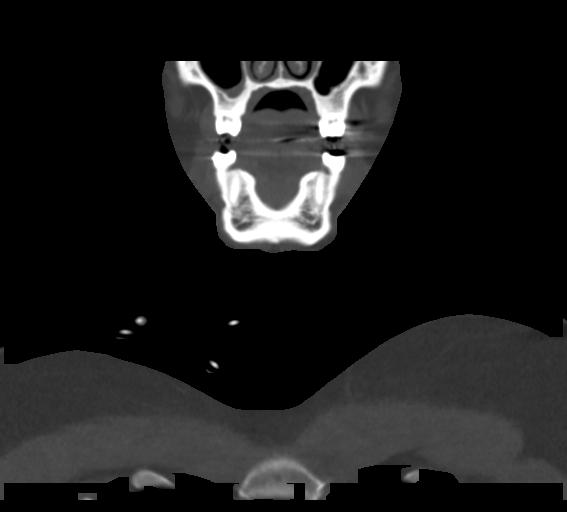
[im 41/101  bone]
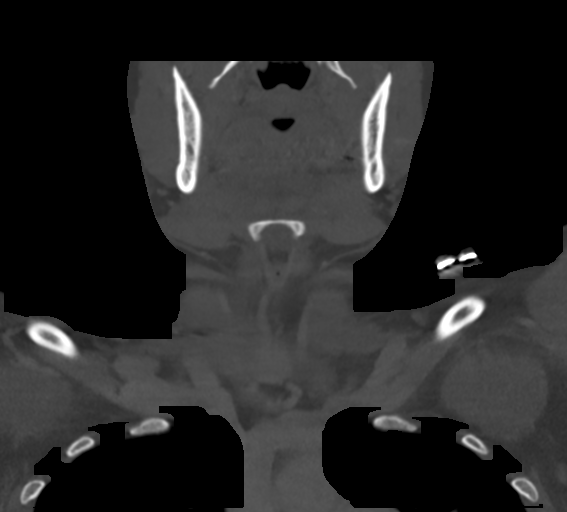
[im 61/101  bone]
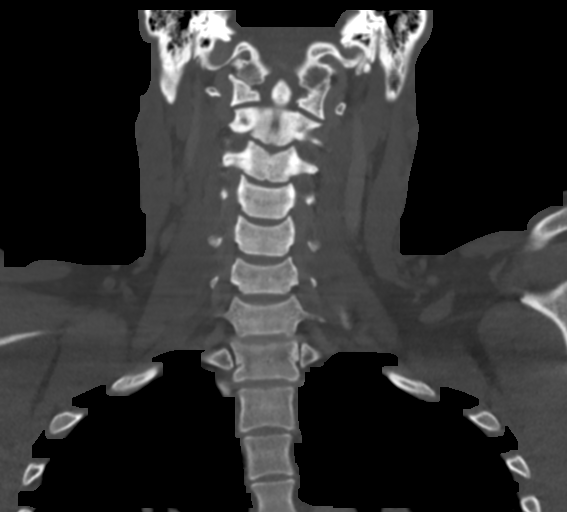

[Series 7: sagittal st · sagittal · 0.43mm/px · 5 of 101 slices shown, 6 images]
[im 34/101  bone]
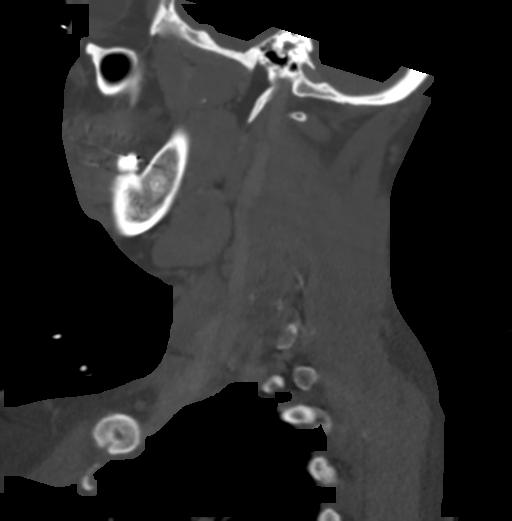
[im 42/101  bone]
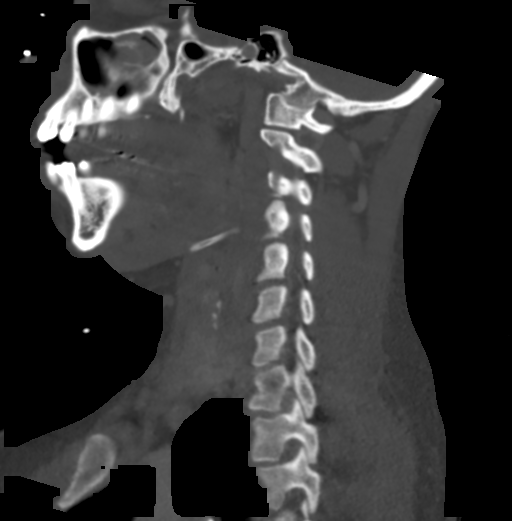
[im 51/101  soft-tissue]
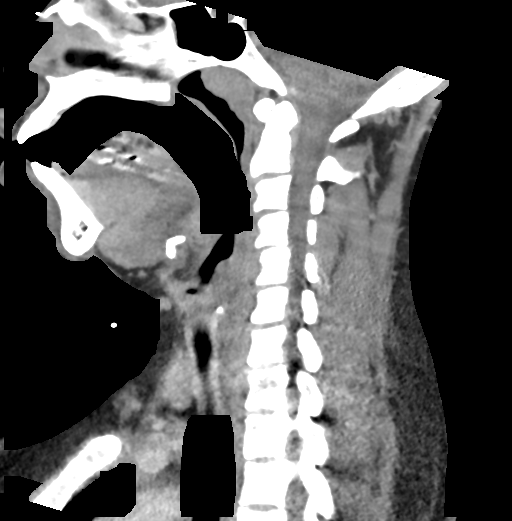
[im 51/101  bone]
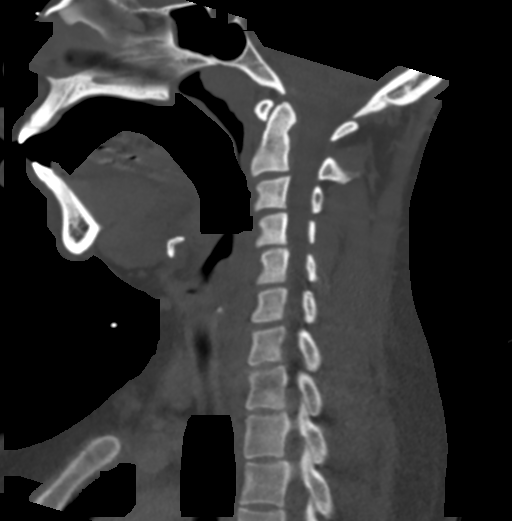
[im 59/101  bone]
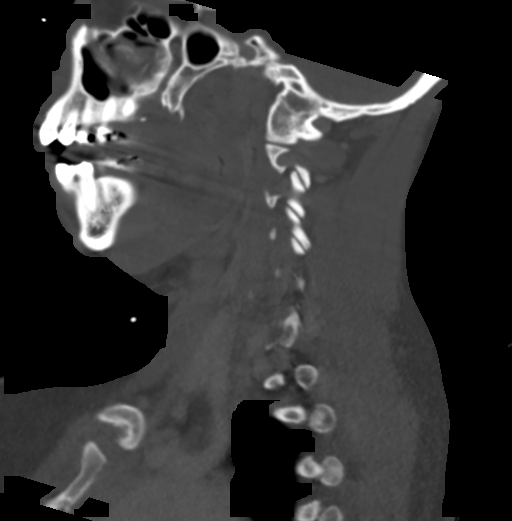
[im 67/101  bone]
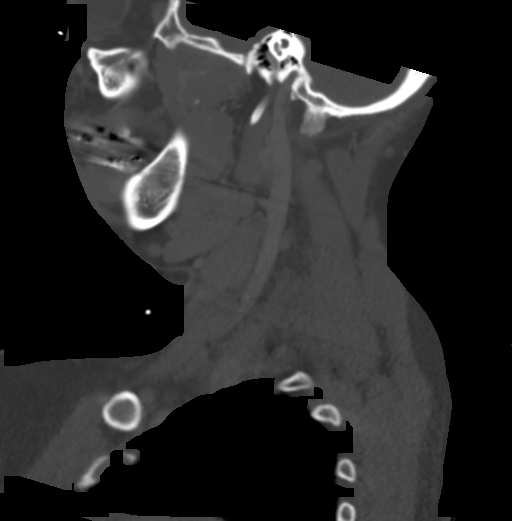

[12 of 33 positions shown; findings below may reference images not displayed]

FINDINGS: Pharynx and larynx: There is moderate symmetric enlargement of the
nasopharyngeal soft tissues, and there is also mild diffuse
prominence of the soft palate and lingual tonsils. The airway is
patent. No fluid collection or significant inflammatory changes are
seen in the parapharyngeal or retropharyngeal spaces.

Salivary glands: No inflammation, mass, or stone.

Thyroid: Unremarkable.

Lymph nodes: Borderline enlarged left level II a lymph node
measuring 10 mm in short axis, likely reactive.

Vascular: Unremarkable.

Limited intracranial: Unremarkable.

Visualized orbits: Unremarkable.

Mastoids and visualized paranasal sinuses: Minimal scattered mucosal
thickening in the paranasal sinuses without fluid. Clear mastoid air
cells.

Skeleton: No acute osseous abnormality or suspicious osseous lesion.

Upper chest: Clear lung apices.

Other: None.
IMPRESSION: Adenoid and lingual tonsillar hyperplasia with soft palate swelling.
No abscess. Patent airway.

## 2019-12-20 MED ORDER — HYDROXYZINE HCL 25 MG PO TABS
25.0000 mg | ORAL_TABLET | Freq: Two times a day (BID) | ORAL | Status: DC | PRN
  Administered 2019-12-21 – 2019-12-24 (×6): 25 mg via ORAL
  Filled 2019-12-20 (×6): qty 1

## 2019-12-20 MED ORDER — DEXTROSE-NACL 5-0.45 % IV SOLN: INTRAVENOUS | Status: DC

## 2019-12-20 MED ORDER — CYCLOBENZAPRINE HCL 10 MG PO TABS
10.0000 mg | ORAL_TABLET | Freq: Every day | ORAL | Status: DC
  Administered 2019-12-20 – 2019-12-23 (×4): 10 mg via ORAL
  Filled 2019-12-20 (×4): qty 1

## 2019-12-20 MED ORDER — IOHEXOL 350 MG/ML SOLN
75.0000 mL | Freq: Once | INTRAVENOUS | Status: AC | PRN
  Administered 2019-12-20: 75 mL via INTRAVENOUS

## 2019-12-20 MED ORDER — BOOST / RESOURCE BREEZE PO LIQD CUSTOM
1.0000 | Freq: Three times a day (TID) | ORAL | Status: DC
  Administered 2019-12-20 – 2019-12-24 (×7): 1 via ORAL
  Filled 2019-12-20 (×9): qty 1

## 2019-12-20 MED ORDER — BUTALBITAL-APAP-CAFFEINE 50-325-40 MG PO TABS
1.0000 | ORAL_TABLET | Freq: Once | ORAL | Status: AC
  Administered 2019-12-20: 1 via ORAL
  Filled 2019-12-20: qty 1

## 2019-12-20 NOTE — Plan of Care (Signed)
  Problem: Education: Goal: Knowledge of General Education information will improve Description: Including pain rating scale, medication(s)/side effects and non-pharmacologic comfort measures Outcome: Progressing   Problem: Pain Managment: Goal: General experience of comfort will improve Outcome: Progressing   

## 2019-12-20 NOTE — Progress Notes (Signed)
Initial Nutrition Assessment  DOCUMENTATION CODES:   Obesity unspecified  INTERVENTION:  Provide Boost Breeze po TID, each supplement provides 250 kcal and 9 grams of protein  Encourage adequate PO intake.    NUTRITION DIAGNOSIS:   Inadequate oral intake related to  (uvular swelling) as evidenced by meal completion < 25%.  GOAL:   Patient will meet greater than or equal to 90% of their needs  MONITOR:   PO intake, Supplement acceptance, Skin, Weight trends, Labs, I & O's, Diet advancement  REASON FOR ASSESSMENT:   Malnutrition Screening Tool    ASSESSMENT:   34 year old female with PMHx of PTSD, anxiety, partial complex seizure disorder, and sickle cell trait presenting with one day history of throat pain and swelling. Pt with likely due to a viral acute pharyngitis.  Pt is currently on a clear liquid diet. Pt only able to take sips of fluids thus far. Pt reports some pain when swallowing. She reports the throat pain and swelling worsened the day of admission. She reports prior to that, she was eating fine at meals. No weight loss per weight records. RD to order nutritional supplements to aid in caloric and protein needs.   NUTRITION - FOCUSED PHYSICAL EXAM:    Most Recent Value  Orbital Region No depletion  Upper Arm Region No depletion  Thoracic and Lumbar Region No depletion  Buccal Region No depletion  Temple Region No depletion  Clavicle Bone Region No depletion  Clavicle and Acromion Bone Region No depletion  Scapular Bone Region No depletion  Dorsal Hand No depletion  Patellar Region No depletion  Anterior Thigh Region No depletion  Posterior Calf Region No depletion  Edema (RD Assessment) None  Hair Reviewed  Eyes Reviewed  Mouth Reviewed  Skin Reviewed  Nails Reviewed     Labs and medications reviewed.   Diet Order:   Diet Order            Diet clear liquid Room service appropriate? Yes; Fluid consistency: Thin  Diet effective now                  EDUCATION NEEDS:   Not appropriate for education at this time  Skin:  Skin Assessment: Reviewed RN Assessment  Last BM:  Unknown  Height:   Ht Readings from Last 1 Encounters:  12/19/19 5\' 6"  (1.676 m)    Weight:   Wt Readings from Last 1 Encounters:  12/20/19 84.7 kg    BMI:  Body mass index is 30.14 kg/m.  Estimated Nutritional Needs:   Kcal:  1900-2100  Protein:  95-105 grams  Fluid:  >/= 1.9 L/day  13/05/21, MS, RD, LDN RD pager number/after hours weekend pager number on Amion.

## 2019-12-20 NOTE — Progress Notes (Addendum)
   Subjective:   Leah Shea was examined and evaluated at bedside this am. She mentions continuing to endorse neck pain and mentions feeling uncomfortable. She mention endorse odynophagia and mentions subjective dyspnea. Denies any dental procedures. Provides additional history with stating this was acute onset with chills and throat pain. Denies any prior hx of pharyngitis with recent COVID infection.   Objective:  Vital signs in last 24 hours: Vitals:   12/19/19 1839 12/19/19 2322 12/20/19 0507 12/20/19 0617  BP:  120/88  117/71  Pulse:  88  (!) 58  Resp:  18  18  Temp:  98.7 F (37.1 C)  98 F (36.7 C)  TempSrc:  Oral  Oral  SpO2: 97% 98%  98%  Weight:   84.7 kg   Height:       Physical Exam: General: Ill-appearing, no acute distress HENT: Anterior and lateral neck tender to palpation. Soft palate erythematous, airway appears patent. No pooling saliva. Dry mucous membranes CV: Regular rate, rhythm. No m/r/g Skin: No erythema or petechiae  Assessment/Plan: Leah Shea is 34yo female with complex partial seizure disorder, PTSD, anxiety, and recent COVID-19 infection admitted 11/4 with acute pharyngitis.  Active Problems:   Acute pharyngitis  #Acute pharyngitis #Uvular swelling Patient presented with one day history of odynophagia and dysphagia s/p one week of flu-like symptoms. ENT consulted in ED, who performed an laryngoscopy, which revealed no involvement of airway or vocal cords. She was started on IV clindamycin and steroids. Group A strep negative. Continues to be afebrile without leukocytosis present. This AM reports minimal improvement of symptoms and has not been able to tolerate po. Mentions her neck feels tight. CT neck obtained, no deep neck space infection. Will continue with antibiotics, steroids. Need to advance diet as tolerated, start with clears today. Likely presentation d/t viral etiology, will continue supportive care. - C/w IV clindamycin, soumedrol - HIV  negative - Checking Mononucleosis test - Will continue to monitor for signs of airway compromise - CBG monitoring q6  DIET: Clear IVF: D5-1/2NS DVT PPX: Lovenox BOWEL: Miralax CODE: FULL  Prior to Admission Living Arrangement: Home Anticipated Discharge Location: Home Barriers to Discharge: tolerating po, medical management Dispo: Anticipated discharge in approximately 1-2 day(s).   Evlyn Kanner, MD 12/20/2019, 6:59 AM Pager: [phone] After 5pm on weekdays and 1pm on weekends: On Call pager [phone]

## 2019-12-21 LAB — CBC
HCT: 36.7 % (ref 36.0–46.0)
Hemoglobin: 12.4 g/dL (ref 12.0–15.0)
MCH: 30.8 pg (ref 26.0–34.0)
MCHC: 33.8 g/dL (ref 30.0–36.0)
MCV: 91.1 fL (ref 80.0–100.0)
Platelets: 189 10*3/uL (ref 150–400)
RBC: 4.03 MIL/uL (ref 3.87–5.11)
RDW: 12.5 % (ref 11.5–15.5)
WBC: 5 10*3/uL (ref 4.0–10.5)
nRBC: 0 % (ref 0.0–0.2)

## 2019-12-21 LAB — BASIC METABOLIC PANEL
Anion gap: 11 (ref 5–15)
BUN: 9 mg/dL (ref 6–20)
CO2: 23 mmol/L (ref 22–32)
Calcium: 8.8 mg/dL — ABNORMAL LOW (ref 8.9–10.3)
Chloride: 105 mmol/L (ref 98–111)
Creatinine, Ser: 0.98 mg/dL (ref 0.44–1.00)
GFR, Estimated: >60 mL/min (ref >60)
Glucose, Bld: 130 mg/dL — ABNORMAL HIGH (ref 70–99)
Potassium: 3.7 mmol/L (ref 3.5–5.1)
Sodium: 139 mmol/L (ref 135–145)

## 2019-12-21 LAB — GLUCOSE, CAPILLARY
Glucose-Capillary: 106 mg/dL — ABNORMAL HIGH (ref 70–99)
Glucose-Capillary: 113 mg/dL — ABNORMAL HIGH (ref 70–99)
Glucose-Capillary: 142 mg/dL — ABNORMAL HIGH (ref 70–99)
Glucose-Capillary: 155 mg/dL — ABNORMAL HIGH (ref 70–99)

## 2019-12-21 MED ORDER — LEVETIRACETAM 100 MG/ML PO SOLN
1000.0000 mg | Freq: Three times a day (TID) | ORAL | Status: DC
  Administered 2019-12-21 – 2019-12-24 (×8): 1000 mg via ORAL
  Filled 2019-12-21 (×11): qty 10

## 2019-12-21 MED ORDER — INSULIN ASPART 100 UNIT/ML ~~LOC~~ SOLN
0.0000 [IU] | Freq: Three times a day (TID) | SUBCUTANEOUS | Status: DC
  Administered 2019-12-22: 3 [IU] via SUBCUTANEOUS
  Administered 2019-12-23: 1 [IU] via SUBCUTANEOUS
  Administered 2019-12-23: 2 [IU] via SUBCUTANEOUS

## 2019-12-21 MED ORDER — LEVETIRACETAM 500 MG PO TABS
1000.0000 mg | ORAL_TABLET | Freq: Three times a day (TID) | ORAL | Status: DC
  Administered 2019-12-21: 1000 mg via ORAL
  Filled 2019-12-21 (×2): qty 2

## 2019-12-21 MED ORDER — PHENOL 1.4 % MT LIQD: 1.0000 | OROMUCOSAL | Status: DC | PRN

## 2019-12-21 MED ORDER — LORAZEPAM 2 MG/ML IJ SOLN
0.5000 mg | Freq: Once | INTRAMUSCULAR | Status: AC
  Administered 2019-12-21: 0.5 mg via INTRAVENOUS
  Filled 2019-12-21: qty 1

## 2019-12-21 NOTE — Progress Notes (Addendum)
MD received page from RN that patient woke up stating that her throat was closing up. She was given the lidocaine viscous solution but patient reported that it did not help. MD went to the bedside to evaluate patient. On arrival, patient was tearful, stating that her throat felt tight and tender. Patient also reported feeling chills and ear pain. On exam, there was no increased uvula swelling, patient was moving air appropriately and there was no wheezing or rhonchi. MD asked nurse to get a set of vitals, which were normal with O2 sats of 98% on RA. Patient was reassured that her throat was patent and it was more likely due to her anxiety. Patient was given Atarax 25 mg po mixed into applesauce to help with anxiety. Patient was given a warm blanket. Chloraseptic mouth spray was ordered as needed for throat irritation/pain.

## 2019-12-21 NOTE — Plan of Care (Signed)
  Problem: Clinical Measurements: Goal: Diagnostic test results will improve Outcome: Progressing   Problem: Activity: Goal: Risk for activity intolerance will decrease Outcome: Progressing   Problem: Coping: Goal: Level of anxiety will decrease Outcome: Not Progressing

## 2019-12-21 NOTE — Progress Notes (Signed)
Staff reports to this rn pt reports cp. This rn at bedside at 1800, pt reports no cp. This rn returned to bedside at 1815, pt appears calm but tearful, reassurance given to pt, placed pt on oxygen 2L [place]. Notified md of pt cp, will get ekg per md. Bruising noted left ribcage.

## 2019-12-21 NOTE — Progress Notes (Signed)
Paged by RN for chest pain, left-sided tingling. Patient evaluated at bedside. She is tearful, describing sharp, mid-sternal chest pain that worsens with deep breaths. Also mentions tingling on her left leg, arm, and chest. States these symptoms started about 30 minutes ago. Says she has not experienced similar symptoms before. Also mentions she is ready to go home.   Vitals stable.  General: Tearful, mild distress CV: Regular rate, rhythm. No m/r/g. Neuro: CN intact. Weakness 5/5 all extremities. Patient says left side "feels different" extremities, face.  MSK: Bruise on left side of chest under breast.  EKG obtained, no ischemic changes.   Plan: -Patient extremely anxious in room, saying she just wants to go home. Symptoms most likely 2/2 anxiety.  -Chest pain is pleuritic, EKG normal.  -Left-sided tingling most likely anxiety-induced. No known risk factors for stroke.  -Will give IV Ativan, can give toradol for pain as needed. -Unclear etiology of PTSD. Can consider lateral XR for possible fractures, although pain acutely started 30 minutes ago. Will try to contact primary care physician for further information.

## 2019-12-21 NOTE — Progress Notes (Signed)
   Subjective:  Patient evaluated at bedside this AM. Patient endorses that she feels like her throat pain is worse today; she is having persistent odynophagia and dysphagia. However, was able to tolerate clear liquids yesterday. She is tearful during examination. Reassurance provided.  Objective:  Vital signs in last 24 hours: Vitals:   12/20/19 1248 12/20/19 1818 12/21/19 0010 12/21/19 0513  BP: 122/81 117/75 114/74 140/84  Pulse: 71 77 (!) 55 (!) 50  Resp: 17 20 16 19   Temp: 98.8 F (37.1 C) 98.6 F (37 C) 98 F (36.7 C) 97.7 F (36.5 C)  TempSrc: Oral Oral Oral Oral  SpO2: 96% 97% 95% 98%  Weight:      Height:       Physical Exam: General: Tearful, no acute distress HENT: Mildly erythematous soft palate, no petechiae, moist mucous membranes Neuro: Awake, alert, oriented x3  Assessment/Plan: Leah Shea is 34yo female with complex partial seizure disorder, PTSD, anxiety, and recent COVID-19 infection admitted 11/4 with acute pharyngitis with likely viral etiology.  Principal Problem:   Acute pharyngitis Active Problems:   Uvular swelling  #Acute viral pharyngitis #Uvular swelling Patient continues to endorse odynophagia, dysphagia. Mentions she feels like she hasn't made much improvement although she was able to tolerate clear liquids yesterday. On exam soft palate appears improved from admission, less erythematous and swollen. Continues to be afebrile without leukocytosis. Group A strep, HIV, mono all negative. Most likely viral etiology, d/c IV clindamycin today as no need for further antibiotics. Will continue with steroids and advance diet as tolerated. - D/c IV clindamycin - C/w IV solumedrol - Monitor for signs of airway compromise - CBG monitoring q6  #Anxiety Overnight patient increasingly anxious over symptoms. This morning patient very tearful, anxious regarding her symptoms and that she is bothering the RNs and MDs. Reassured patient she was not bothering the  staff and encouraged her to let 13/4 know if she needs anything or any change in symptoms. Patient takes hydroxyzine 10mg  BID at home. Will continue to monitor and reassure patient as needed. - Increase hydroxyzine 25mg  BID  DIET: Clears, advance as tolerated IVF: n/a DVT PPX: Lovenox BOWEL: Miaralax CODE: FULL  Prior to Admission Living Arrangement: Home Anticipated Discharge Location: Home Barriers to Discharge: tolerating po, medical mangement Dispo: Anticipated discharge in approximately 1-2 day(s).   [place], MD 12/21/2019, 6:54 AM Pager: [phone] After 5pm on weekdays and 1pm on weekends: On Call pager [phone]

## 2019-12-22 ENCOUNTER — Inpatient Hospital Stay (HOSPITAL_COMMUNITY): Payer: Medicaid Other

## 2019-12-22 LAB — GLUCOSE, CAPILLARY
Glucose-Capillary: 106 mg/dL — ABNORMAL HIGH (ref 70–99)
Glucose-Capillary: 119 mg/dL — ABNORMAL HIGH (ref 70–99)
Glucose-Capillary: 121 mg/dL — ABNORMAL HIGH (ref 70–99)
Glucose-Capillary: 233 mg/dL — ABNORMAL HIGH (ref 70–99)
Glucose-Capillary: 96 mg/dL (ref 70–99)

## 2019-12-22 LAB — C-REACTIVE PROTEIN: CRP: <0.5 mg/dL (ref <1.0)

## 2019-12-22 LAB — BASIC METABOLIC PANEL
Anion gap: 7 (ref 5–15)
BUN: 8 mg/dL (ref 6–20)
CO2: 27 mmol/L (ref 22–32)
Calcium: 8.7 mg/dL — ABNORMAL LOW (ref 8.9–10.3)
Chloride: 107 mmol/L (ref 98–111)
Creatinine, Ser: 0.89 mg/dL (ref 0.44–1.00)
GFR, Estimated: >60 mL/min (ref >60)
Glucose, Bld: 107 mg/dL — ABNORMAL HIGH (ref 70–99)
Potassium: 3.6 mmol/L (ref 3.5–5.1)
Sodium: 141 mmol/L (ref 135–145)

## 2019-12-22 LAB — SEDIMENTATION RATE: Sed Rate: 21 mm/hr (ref 0–22)

## 2019-12-22 LAB — CBC
HCT: 35.3 % — ABNORMAL LOW (ref 36.0–46.0)
Hemoglobin: 11.8 g/dL — ABNORMAL LOW (ref 12.0–15.0)
MCH: 30.4 pg (ref 26.0–34.0)
MCHC: 33.4 g/dL (ref 30.0–36.0)
MCV: 91 fL (ref 80.0–100.0)
Platelets: 174 10*3/uL (ref 150–400)
RBC: 3.88 MIL/uL (ref 3.87–5.11)
RDW: 12.4 % (ref 11.5–15.5)
WBC: 8.4 10*3/uL (ref 4.0–10.5)
nRBC: 0 % (ref 0.0–0.2)

## 2019-12-22 LAB — PREGNANCY, URINE: Preg Test, Ur: NEGATIVE

## 2019-12-22 IMAGING — DX DG HIP (WITH OR WITHOUT PELVIS) 2-3V*L*
3 series · 3 of 3 positions shown · non-contrast
Comparison: [DATE]

CLINICAL DATA: LEFT hip pain, no injury

EXAM:
DG HIP (WITH OR WITHOUT PELVIS) 2-3V LEFT

[pelvis ap]
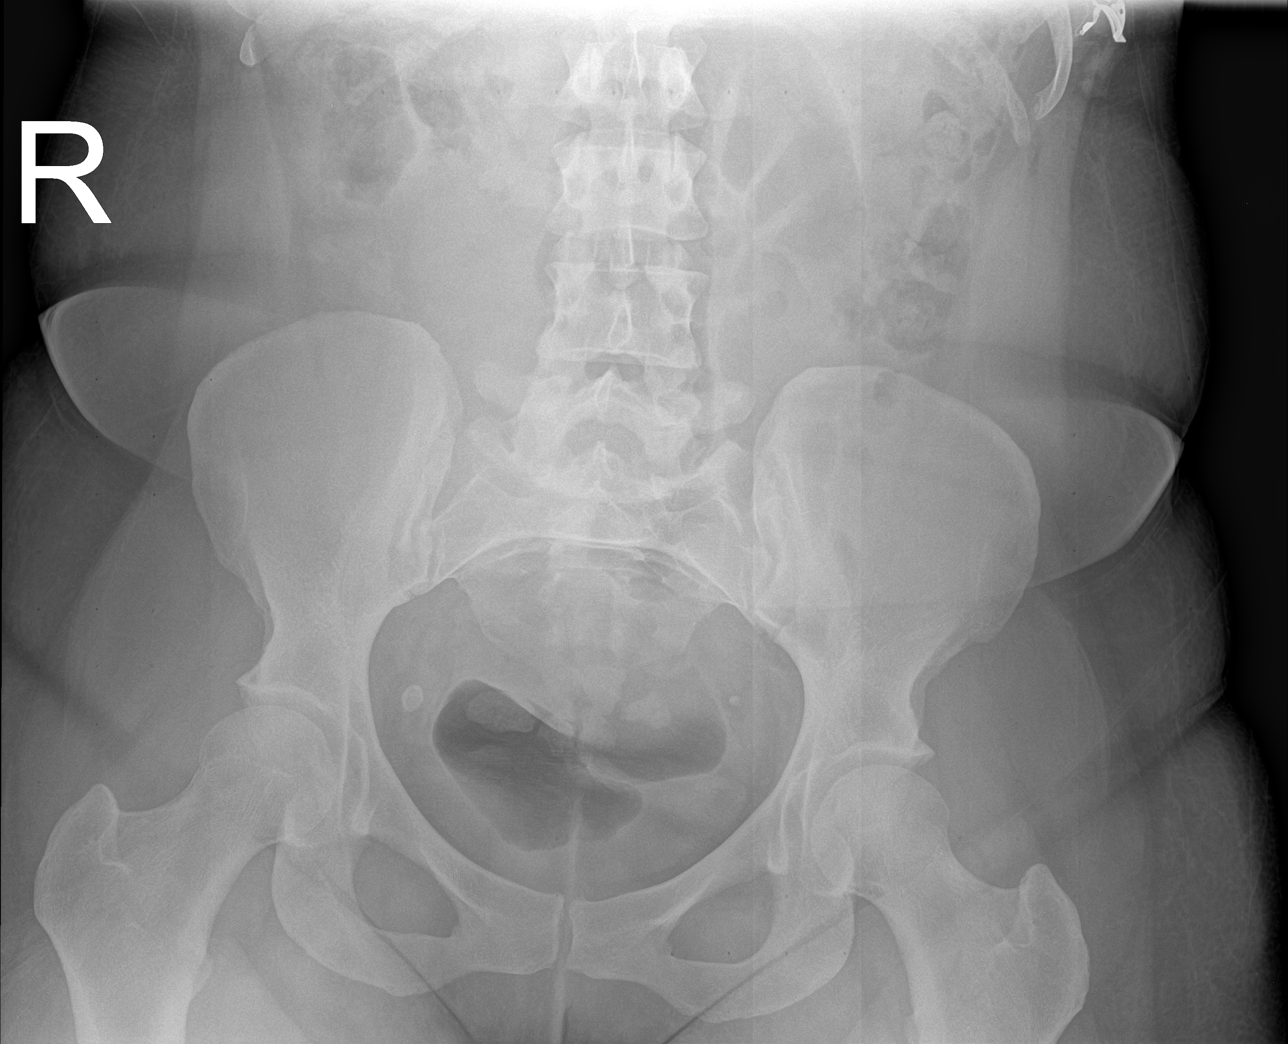

[hip ap]
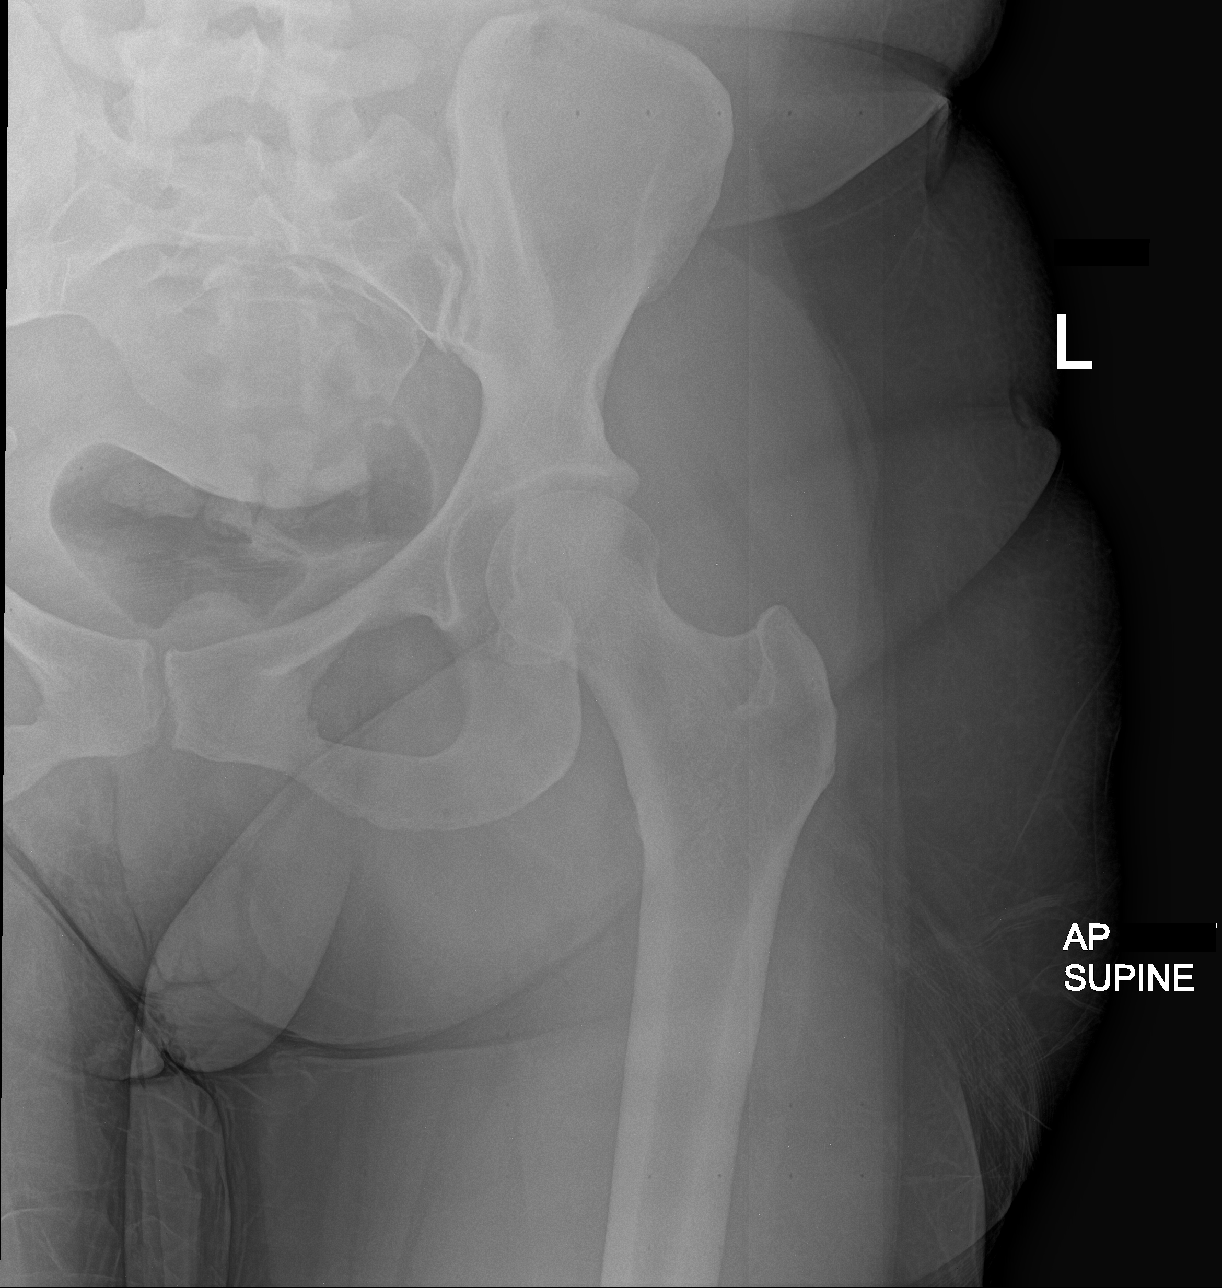

[hip lat]
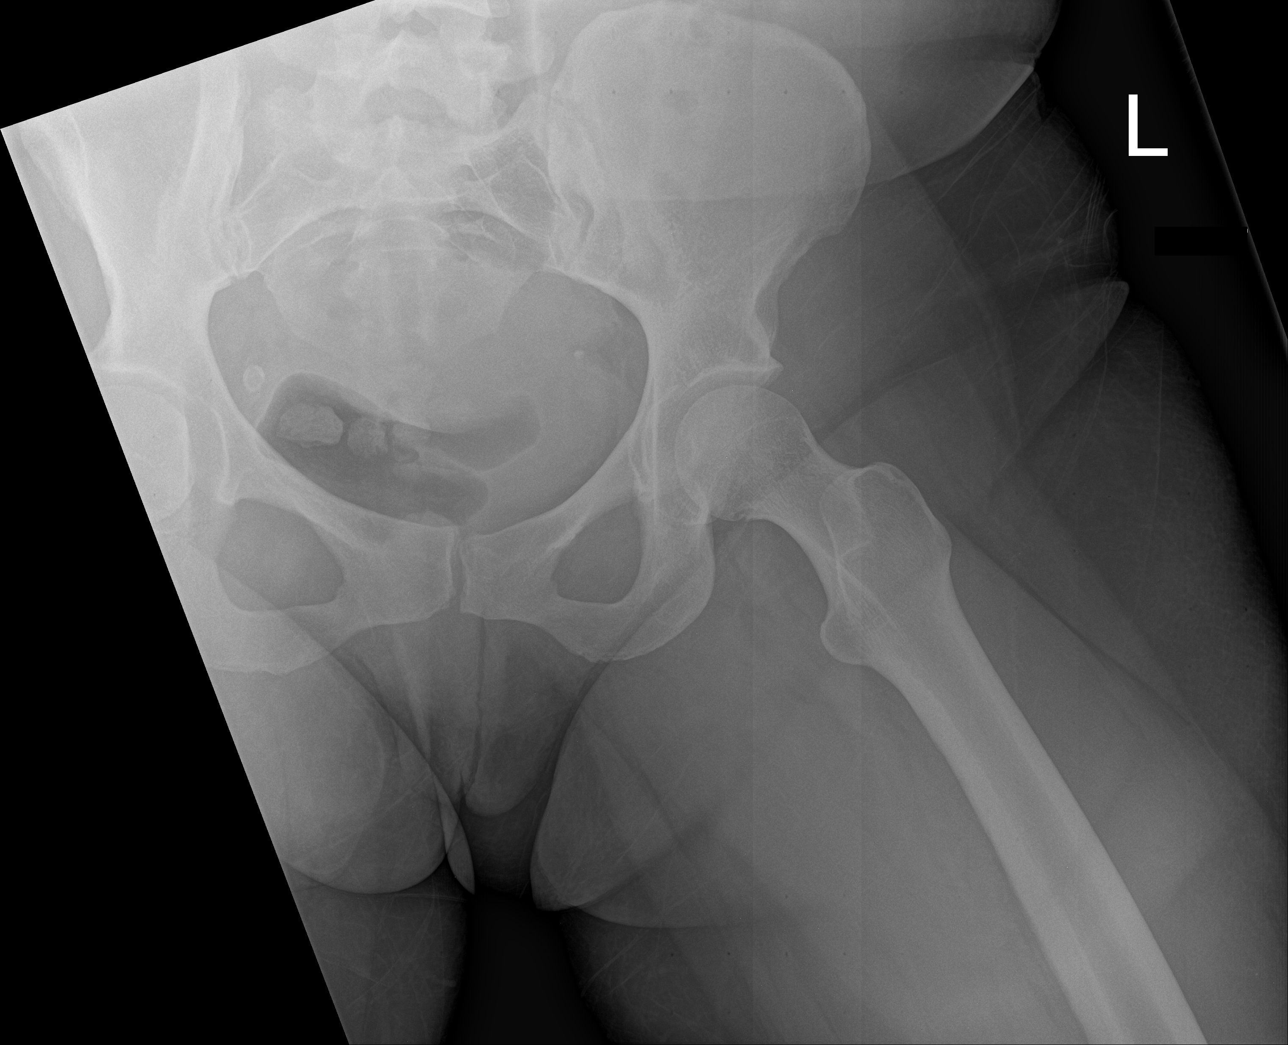

[3 of 3 positions shown; findings below may reference images not displayed]

FINDINGS: Osseous mineralization normal.

Hip and SI joint spaces preserved.

No acute fracture, dislocation, or bone destruction.

Pelvic phleboliths stable.

IUD no longer visualized.
IMPRESSION: Normal exam.

## 2019-12-22 IMAGING — CR DG CHEST 2V
2 series · 2 of 2 positions shown · non-contrast
Comparison: [DATE]

CLINICAL DATA: LEFT side chest pain worsened with deep breathing,
tingling in LEFT arm, leg, and chest, onset of symptoms today,
history anxiety, asthma, sickle cell anemia, former smoker

EXAM:
CHEST - 2 VIEW

[chest lat]
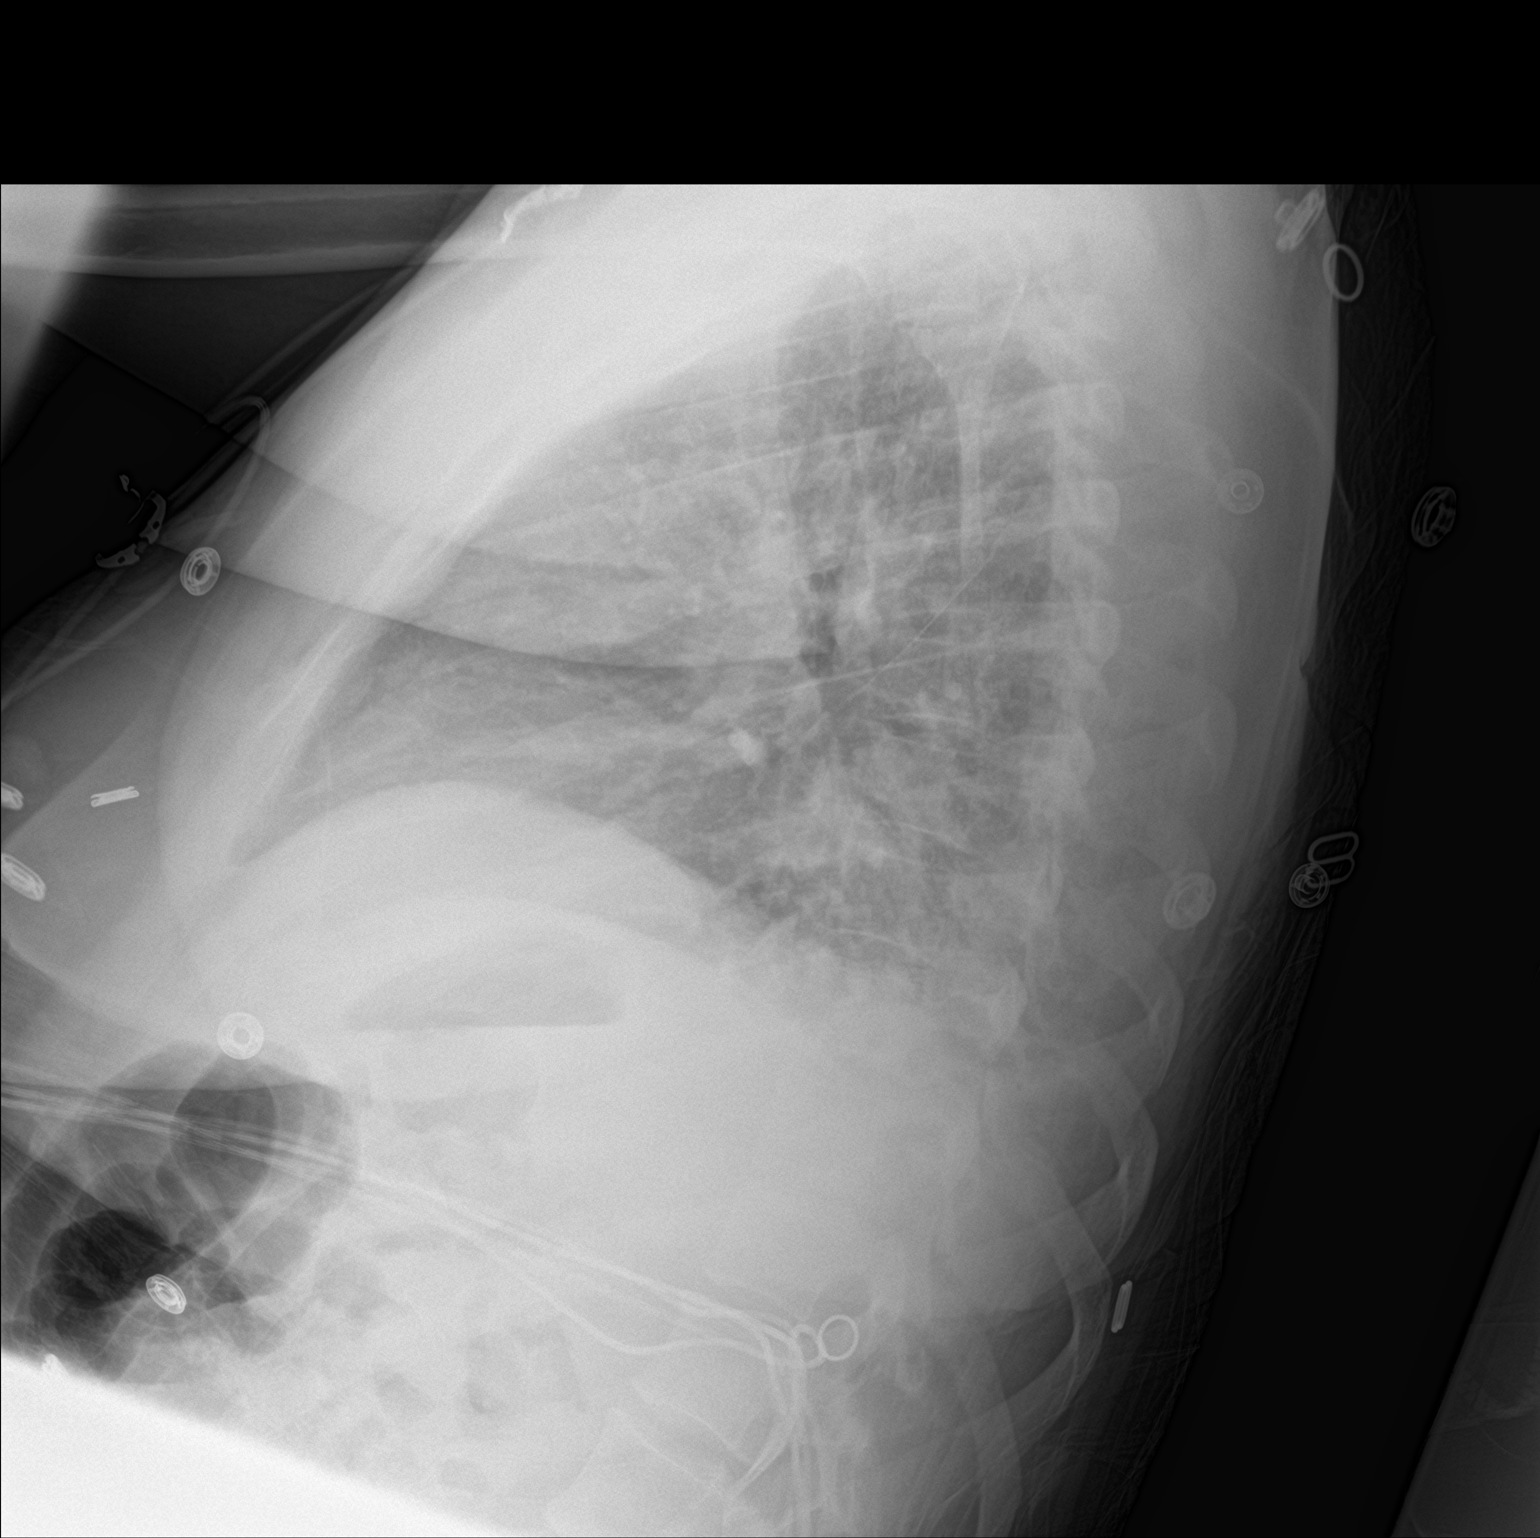

[chest ap]
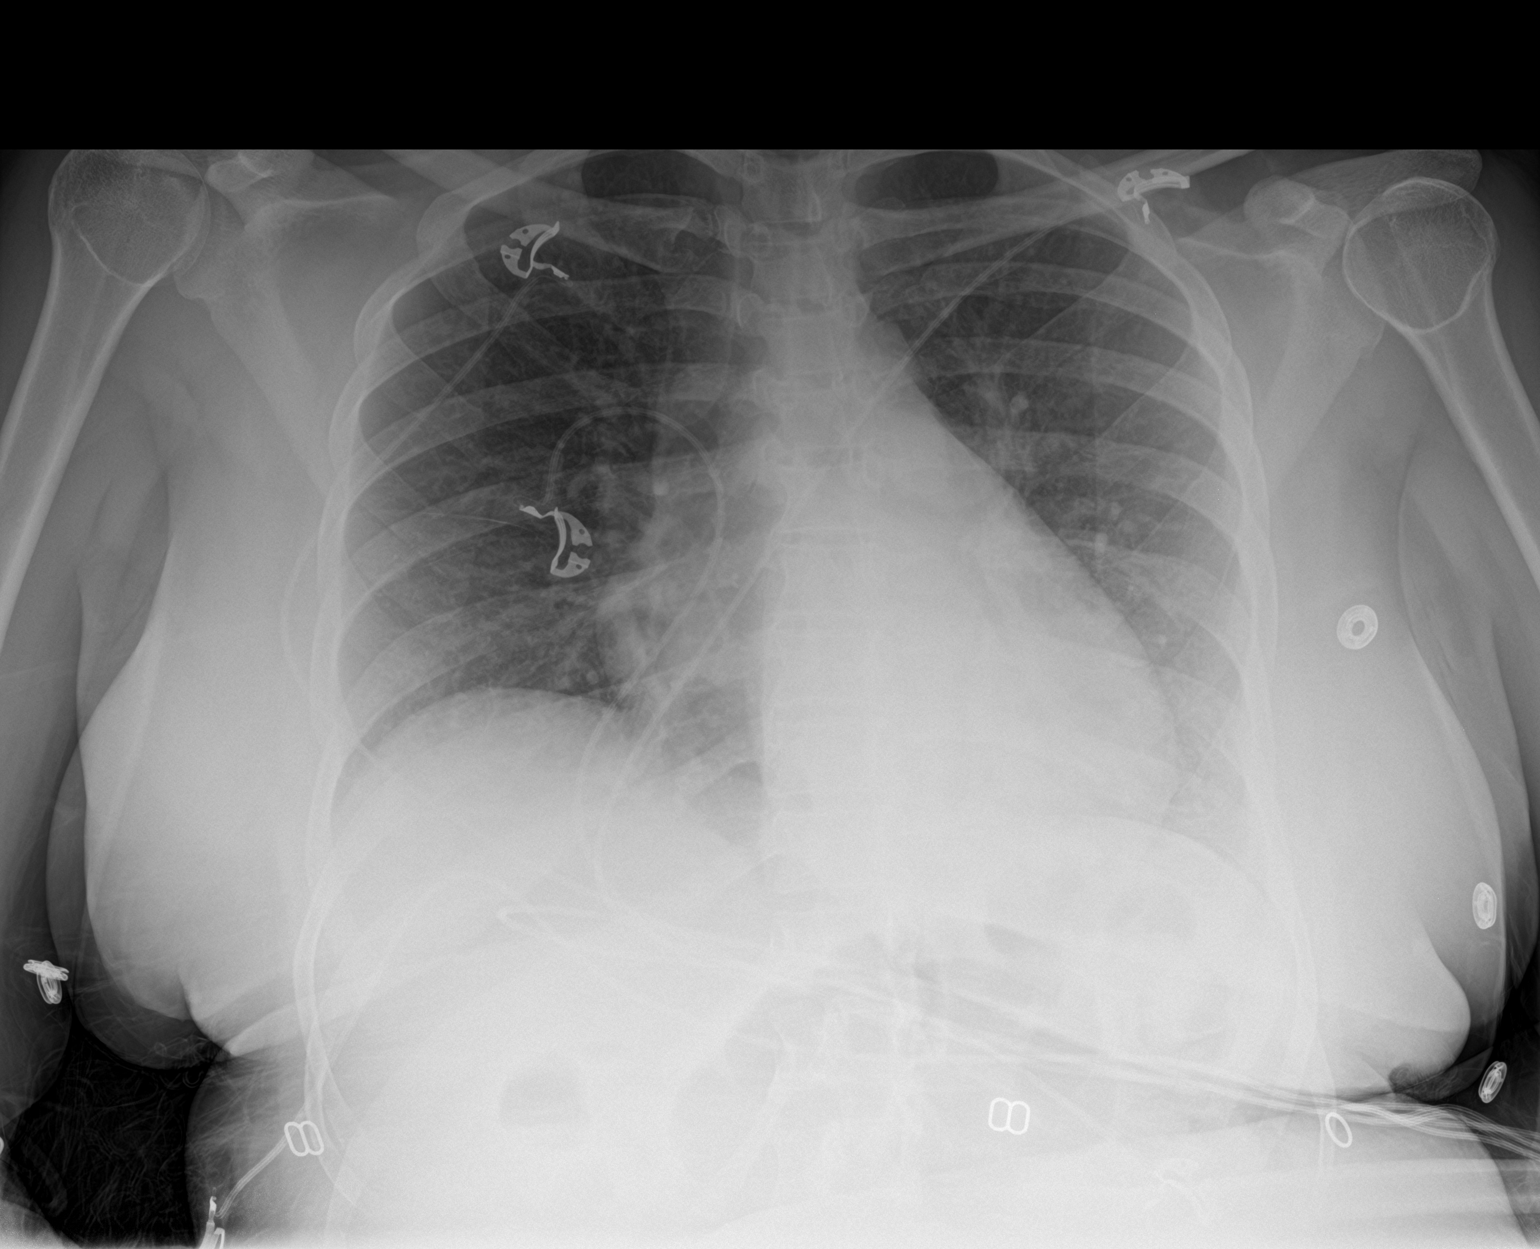

[2 of 2 positions shown; findings below may reference images not displayed]

FINDINGS: Minimal of cardiac silhouette.

Mediastinal contours and pulmonary vascularity normal.

Retrocardiac LEFT lower lobe infiltrate consistent with pneumonia.

Remaining lungs clear.

No pleural effusion or pneumothorax.
IMPRESSION: LEFT lower lobe pneumonia.

## 2019-12-22 MED ORDER — METHYLPREDNISOLONE SODIUM SUCC 125 MG IJ SOLR
60.0000 mg | INTRAMUSCULAR | Status: DC
  Administered 2019-12-22 – 2019-12-23 (×2): 60 mg via INTRAVENOUS
  Filled 2019-12-22 (×2): qty 2

## 2019-12-22 MED ORDER — CLONAZEPAM 0.1 MG/ML ORAL SUSPENSION: 0.2500 mg | Freq: Two times a day (BID) | ORAL | Status: DC | PRN

## 2019-12-22 MED ORDER — OXYCODONE HCL 5 MG PO TABS
5.0000 mg | ORAL_TABLET | Freq: Four times a day (QID) | ORAL | Status: DC | PRN
  Administered 2019-12-22 – 2019-12-24 (×6): 5 mg via ORAL
  Filled 2019-12-22 (×6): qty 1

## 2019-12-22 MED ORDER — CLONAZEPAM 0.25 MG PO TBDP
0.2500 mg | ORAL_TABLET | Freq: Two times a day (BID) | ORAL | Status: DC | PRN
  Administered 2019-12-23: 0.25 mg via ORAL
  Filled 2019-12-22: qty 1

## 2019-12-22 NOTE — Progress Notes (Signed)
   Subjective:  Patient evaluated at bedside this AM. Tearful, continued left sided tingling and sharp/stabbing pain in the left chest, notes slightly worse today. No prior history of similar pain. Central chest pain and around left side and back. Worse with inspiration. Left lower extremity tingling as well. Pain is on entire left side.  Endorses that Ativan helped with sleep.   Objective:  Vital signs in last 24 hours: Vitals:   12/21/19 1221 12/21/19 1811 12/22/19 0003 12/22/19 0439  BP: 121/79 129/80 127/80 118/73  Pulse: 62 69 (!) 56 (!) 51  Resp: 16 16 18 18   Temp: 98.5 F (36.9 C) 98.5 F (36.9 C) 97.9 F (36.6 C) 97.7 F (36.5 C)  TempSrc: Oral Oral Oral Oral  SpO2: 96% 99% 100% 100%  Weight:      Height:       Physical Exam: General: Tearful, mild distress MSK: Tenderness on left hip, mid-sternal chest and left lower back. Legs appear symmetrical, no tenderness or redness. Neuro: Awake, alert, oriented x3. Moves all four extremities.  Assessment/Plan: Leah Shea is 34yo female with complex partial seizure disorder, PTSD, anxiety, and recent COVID-19 infection admitted 11/4 with acute viral pharyngitis now endorsing worsening anxiety.  Principal Problem:   Viral pharyngitis Active Problems:   Uvular swelling  #Anxiety Patient has had worsening anxiety over last 24 hours. Remains tearful, endorsing she is worried that she is bothering the staff too much. Currently taking hydroxyzine 25mg  BID. One dose of Ativan given yesterday evening, which helped her sleep and reduce anxiety. Patient has also been on high-dose steroids since admission. Given risk of steroids exacerbating underlying anxiety will decrease with plans to d/c after tomorrow. Encouraged patient today to tell RN if she has any change in symptoms. Will continue to reassure patient. - Klonopin 0.25mg  BID PRN - Hydroxyzine 25mg  BID PRN  #Acute viral pharyngitis #Uvular swelling Patient reports improvement of  odynophagia this morning. Mentions she tried eating a sandwich yesterday but could not tolerate. Encouraged diet of liquids and clears and advance slowly to soft foods. Decreasing steroids in setting of improved symptoms with worsening anxiety. - IV solumedrol 60mg  (day 4/5) - CBG monitoring q6 - Monitor for signs of airway compromise - Advance diet as tolerated  #Pleuritic chest pain #Left-sided tingling Yesterday evening patient reported sudden onset mid-sternal pleuritic chest pain and tingling of the left side of her body. Patient examined, no neurological deficits appreciated. No known risk factors for stroke, EKG without ischemic changes. Patient on chemical VTE prophylaxis, no signs of clotting on exam. Symptoms believed to be MSK-related, possibly from being in hospital bed since 11/4. Pending labs, imaging for further work-up. - XR left hip, pelvis - F/u CRP, ESR - Oxycodone 5mg  q6 PRN  DIET: Clears, advance as tolerated IVF: n/a DVT PPX: Lovenox BOWEL: Miralax CODE: FULL  Prior to Admission Living Arrangement: Home Anticipated Discharge Location: Home Barriers to Discharge: tolerating po, medical management Dispo: Anticipated discharge in approximately 1-2 day(s).   Sanjuan Dame, MD 12/22/2019, 6:17 AM Pager: [phone] After 5pm on weekdays and 1pm on weekends: On Call pager [phone]

## 2019-12-23 DIAGNOSIS — R091 Pleurisy: Secondary | ICD-10-CM

## 2019-12-23 DIAGNOSIS — R1310 Dysphagia, unspecified: Secondary | ICD-10-CM

## 2019-12-23 LAB — GLUCOSE, CAPILLARY
Glucose-Capillary: 129 mg/dL — ABNORMAL HIGH (ref 70–99)
Glucose-Capillary: 93 mg/dL (ref 70–99)
Glucose-Capillary: 155 mg/dL — ABNORMAL HIGH (ref 70–99)
Glucose-Capillary: 135 mg/dL — ABNORMAL HIGH (ref 70–99)

## 2019-12-23 LAB — BASIC METABOLIC PANEL
Anion gap: 5 (ref 5–15)
BUN: 8 mg/dL (ref 6–20)
CO2: 26 mmol/L (ref 22–32)
Calcium: 8.6 mg/dL — ABNORMAL LOW (ref 8.9–10.3)
Chloride: 106 mmol/L (ref 98–111)
Creatinine, Ser: 0.89 mg/dL (ref 0.44–1.00)
GFR, Estimated: >60 mL/min (ref >60)
Glucose, Bld: 135 mg/dL — ABNORMAL HIGH (ref 70–99)
Potassium: 3.4 mmol/L — ABNORMAL LOW (ref 3.5–5.1)
Sodium: 137 mmol/L (ref 135–145)

## 2019-12-23 LAB — CBC
HCT: 36.3 % (ref 36.0–46.0)
Hemoglobin: 12.1 g/dL (ref 12.0–15.0)
MCH: 30.4 pg (ref 26.0–34.0)
MCHC: 33.3 g/dL (ref 30.0–36.0)
MCV: 91.2 fL (ref 80.0–100.0)
Platelets: 162 10*3/uL (ref 150–400)
RBC: 3.98 MIL/uL (ref 3.87–5.11)
RDW: 12.1 % (ref 11.5–15.5)
WBC: 7.9 10*3/uL (ref 4.0–10.5)
nRBC: 0 % (ref 0.0–0.2)

## 2019-12-23 NOTE — Progress Notes (Addendum)
Subjective:  Patient evaluated at bedside this AM. States she is doing pretty well, overall tired of being in the hospital. PO intake improving, eating some toast for breakfast this morning. Discussed eating softer foods to not irritate throat. Continues to endorse mid-sternal and lower left back pleuritic chest pain. Mentions the tingling on the entire left side of her body is still there. Says she had MRI head done in mid-October at the [place] to evaluate for pituitary tumor. States she was told this scan was normal.  She says she does not quite feel comfortable going home at this time.   Objective:  Vital signs in last 24 hours: Vitals:   12/22/19 1156 12/22/19 1751 12/23/19 0012 12/23/19 0606  BP: 120/76 111/67 106/71 130/71  Pulse: (!) 59 78 (!) 53 (!) 52  Resp: 19 19 18 18   Temp: 98.4 F (36.9 C) 98.8 F (37.1 C) 97.8 F (36.6 C) 97.7 F (36.5 C)  TempSrc: Oral Oral Oral Oral  SpO2: 100% 99% 100% 100%  Weight:    86 kg  Height:       Physical Exam: General: Pleasant, no acute distress Pulm: Few rhonchi heard at lower left base. Neuro: Awake, alert, oriented x3. Moving all extremities appropriately.  Assessment/Plan: Leah Shea is 34yo female with complex partial seizure disorder, PTSD, anxiety, and recent COVID-19 infection admitted 11/4 with acute viral pharyngitis, now with improved anxiety and symptoms consistent with pleurisy.  Principal Problem:   Viral pharyngitis Active Problems:   Uvular swelling  #Acute viral pharyngitis #Pleurisy Patient continues to report improvement of odynophagia. In the room this morning she had eaten some toast and bacon. Mentions she is going to try some mashed potatoes for lunch. Encouraged eating softer foods and slowly advancing. Plan to d/c steroids today given heightened anxiety over the weekend. Over the weekend patient had sudden-onset pleuritic chest pain that remains today. EKG Saturday without ischemic changes. CXR yesterday with  left lower lobe opacity with involvement of pleura. Patient continues to be afebrile, no leukocytosis, no dyspnea or cough. Will continue supportive care, no role for antibiotics as opacity likely viral. She is allergic to NSAID's, will continue with oxy for pain control. Plan to monitor today and discharge tomorrow pending patient's clinical status. - D/c solumedrol - Advance diet as tolerated - Oxycodone 5mg  q6  #Anxiety Patient markedly improved mood this morning. We discussed difficulty of being in hospital, especially alone. She says mentally feels much better today. Plan to discuss outpatient follow-up with her tomorrow. Unsure if patient sees psychiatrist, but believe she would benefit. She has previously seen Thursday, but feels she was dismissed. Will discuss options with her. - Klonopin 0.25mg  BID PRN - Hydroxyzine 25mg  BID PRN   #Left-sided tingling Over the weekend patient experienced pleuritic chest pain and left-sided tingling simultaneously. Exam revealed intact cranial nerves, strength, "different" sensation between L and R. Endorses tingling from head to toe on left side of body. No known risk factors for stroke. Patient does have history of complex seizure disorder, had MRI at the last month that was normal. No need for further imaging at this time. Expect symptoms to resolve with supportive care. - Supportive care - Monitor for further neurological signs  DIET: CM as tolerated IVF: n/a DVT PPX: Lovenox BOWEL: Miralax CODE: FULL FAM COM: Attempted call to husband, no answer. Patient mentions husband was not allowed to visit as a friend had come to see her earlier in the day. He is to come  see her today, plan to talk with him and patient later today.  Prior to Admission Living Arrangement:  Home Anticipated Discharge Location: Home Barriers to Discharge: tolerating po, medical management Dispo: Anticipated discharge in approximately 1 day(s).   Leah Kanner,  MD 12/23/2019, 6:11 AM Pager: [phone] After 5pm on weekdays and 1pm on weekends: On Call pager [phone]

## 2019-12-23 NOTE — Care Management (Signed)
[id number] 12-23-19 Physical Therapy recommendations for durable medical equipment (DME) for rolling walker. Order placed and referral sent to [employer]. DME to be delivered to the room prior to transition home. Gala Lewandowsky, RN,BSN Case Manager

## 2019-12-23 NOTE — Evaluation (Signed)
Physical Therapy Evaluation Patient Details Name: Leah Shea MRN: [id number] DOB: Aug 14, 1985 Today's Date: 12/23/2019   History of Present Illness  Pt amd with acute pharyngitis. PMH - covid, anxiety, depression, PTSD. partial complex seizure disorder, sickle cell trait.  Clinical Impression  Pt presents to PT with a decr in mobility due to reported pins and needles pain in lt side of body. Rolling walker helpful in pt being able to mobilize. 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 Reassured patient that I thought she would see daily improvement.     Follow Up Recommendations No PT follow up    Equipment Recommendations  Rolling walker with 5" wheels    Recommendations for Other Services       Precautions / Restrictions Restrictions Weight Bearing Restrictions: No      Mobility  Bed Mobility Overal bed mobility: Modified Independent             General bed mobility comments: Incr time    Transfers Overall transfer level: Needs assistance Equipment used: Rolling walker (2 wheeled) Transfers: Sit to/from Stand Sit to Stand: Min guard;Supervision         General transfer comment: On first sit to stand min guard for safety and incr time to rise. On second sit to stand improved and only supervision  Ambulation/Gait Ambulation/Gait assistance: Min guard Gait Distance (Feet): 100 Feet Assistive device: Rolling walker (2 wheeled) Gait Pattern/deviations: Step-through pattern;Decreased step length - right;Decreased step length - left;Trunk flexed Gait velocity: slow, deliberate Gait velocity interpretation: <1.31 ft/sec, indicative of household ambulator General Gait Details: Slow, deliberate cadence with pt using UE's on walker to decr pain. In initial  39' pt with some knee softening but no buckling and able to continue without intervention  Stairs            Wheelchair Mobility    Modified Rankin (Stroke Patients  Only)       Balance Overall balance assessment: No apparent balance deficits (not formally assessed)                                           Pertinent Vitals/Pain Pain Assessment: 0-10 Pain Score: 7  Pain Location: Entire lt side Pain Descriptors / Indicators: Pins and needles Pain Intervention(s): Monitored during session;Heat applied    Home Living Family/patient expects to be discharged to:: Private residence Living Arrangements: Children;Spouse/significant other (pt tells me she and spouse are separated) Available Help at Discharge: Family Type of Home: House Home Access: Stairs to enter   Secretary/administrator of Steps: 1 Home Layout: One level Home Equipment: None      Prior Function Level of Independence: Independent               Hand Dominance   Dominant Hand: Right    Extremity/Trunk Assessment   Upper Extremity Assessment Upper Extremity Assessment: LUE deficits/detail LUE Deficits / Details: limited by pain    Lower Extremity Assessment Lower Extremity Assessment: LLE deficits/detail LLE Deficits / Details: limited by pain       Communication   Communication: Expressive difficulties (low volume)  Cognition Arousal/Alertness: Awake/alert Behavior During Therapy: Anxious Overall Cognitive Status: Within Functional Limits for tasks assessed  General Comments      Exercises     Assessment/Plan    PT Assessment Patient needs continued PT services  PT Problem List Decreased activity tolerance;Decreased mobility;Pain       PT Treatment Interventions DME instruction;Gait training;Functional mobility training;Therapeutic activities;Therapeutic exercise;Patient/family education    PT Goals (Current goals can be found in the Care Plan section)  Acute Rehab PT Goals Patient Stated Goal: get better PT Goal Formulation: With patient Time For Goal Achievement:  12/30/19 Potential to Achieve Goals: Good    Frequency Min 3X/week   Barriers to discharge        Co-evaluation               AM-PAC PT "6 Clicks" Mobility  Outcome Measure Help needed turning from your back to your side while in a flat bed without using bedrails?: None Help needed moving from lying on your back to sitting on the side of a flat bed without using bedrails?: None Help needed moving to and from a bed to a chair (including a wheelchair)?: A Little Help needed standing up from a chair using your arms (e.g., wheelchair or bedside chair)?: A Little Help needed to walk in hospital room?: A Little Help needed climbing 3-5 steps with a railing? : A Little 6 Click Score: 20    End of Session   Activity Tolerance: Patient limited by pain Patient left: in bed;with call bell/phone within reach Nurse Communication: Mobility status PT Visit Diagnosis: Other abnormalities of gait and mobility (R26.89);Pain Pain - Right/Left: Left Pain - part of body:  (entire side)    Time: 7062-3762 PT Time Calculation (min) (ACUTE ONLY): 26 min   Charges:   PT Evaluation $PT Eval Moderate Complexity: 1 Mod PT Treatments $Gait Training: 8-22 mins        [facility] PT Acute Rehabilitation Services Pager [phone] Office [phone]   Angelina Ok [facility] 12/23/2019, 5:09 PM

## 2019-12-23 NOTE — Plan of Care (Signed)
  Problem: Education: Goal: Knowledge of General Education information will improve Description: Including pain rating scale, medication(s)/side effects and non-pharmacologic comfort measures Outcome: Progressing   Problem: Activity: Goal: Risk for activity intolerance will decrease Outcome: Progressing Note: Walked in hallway with walker and 1 assist   Problem: Nutrition: Goal: Adequate nutrition will be maintained Outcome: Progressing   Problem: Coping: Goal: Level of anxiety will decrease Outcome: Progressing Note: Treated once for anxiety with PRN clonazepam   Problem: Pain Managment: Goal: General experience of comfort will improve Outcome: Progressing Note: Treated once for left rib pain with PRN oxycodone & got k pad ordered   Problem: Safety: Goal: Ability to remain free from injury will improve Outcome: Progressing   Problem: Skin Integrity: Goal: Risk for impaired skin integrity will decrease Outcome: Progressing

## 2019-12-24 ENCOUNTER — Telehealth: Payer: Self-pay

## 2019-12-24 LAB — BASIC METABOLIC PANEL
Anion gap: 9 (ref 5–15)
BUN: 14 mg/dL (ref 6–20)
CO2: 25 mmol/L (ref 22–32)
Calcium: 8.9 mg/dL (ref 8.9–10.3)
Chloride: 105 mmol/L (ref 98–111)
Creatinine, Ser: 0.84 mg/dL (ref 0.44–1.00)
GFR, Estimated: >60 mL/min (ref >60)
Glucose, Bld: 98 mg/dL (ref 70–99)
Potassium: 3.4 mmol/L — ABNORMAL LOW (ref 3.5–5.1)
Sodium: 139 mmol/L (ref 135–145)

## 2019-12-24 LAB — GLUCOSE, CAPILLARY
Glucose-Capillary: 84 mg/dL (ref 70–99)
Glucose-Capillary: 98 mg/dL (ref 70–99)

## 2019-12-24 MED ORDER — HYDROXYZINE HCL 25 MG PO TABS: 25.0000 mg | ORAL_TABLET | Freq: Two times a day (BID) | ORAL | 0 refills | Status: DC | PRN

## 2019-12-24 MED ORDER — CLONAZEPAM 0.25 MG PO TBDP
0.2500 mg | ORAL_TABLET | Freq: Two times a day (BID) | ORAL | 0 refills | Status: DC | PRN
Start: 2019-12-24 — End: 2020-03-27

## 2019-12-24 MED ORDER — ENSURE ENLIVE PO LIQD: 1.0000 | Freq: Two times a day (BID) | ORAL | 12 refills | Status: DC

## 2019-12-24 MED ORDER — FLUOXETINE HCL 10 MG PO CAPS: 10.0000 mg | ORAL_CAPSULE | Freq: Every day | ORAL | 0 refills | Status: DC

## 2019-12-24 NOTE — Progress Notes (Signed)
   Subjective:  Patient notes that her throat feels better and she was able to tolerate grits this morning. She does endorse ongoing left sided chest pain that feels like "bricks in my lung". Continues to endorse tingling on the left side. She was able to tolerate working with physical therapy this morning. She does endorse good support system at home. She follows up at the [place] in [place]. Discussed one time hospital follow up in [facility] in 1 week. Plan to d/c with klonopin. She was previously on prozac but notes that this was discontinued by the [place]. Unclear as to why she was discontinued at this time. Discussed starting on SSRI and f/u. Fluoxetine on d/c.   Objective:  Vital signs in last 24 hours: Vitals:   12/23/19 1212 12/23/19 1827 12/24/19 0002 12/24/19 0552  BP: 129/69 113/65 115/72 126/76  Pulse: 66 (!) 59 60 (!) 58  Resp: 16 16 18 18   Temp: 97.9 F (36.6 C) 98.1 F (36.7 C) 97.9 F (36.6 C) 98.3 F (36.8 C)  TempSrc: Oral Oral Oral Oral  SpO2: 98% 97% 99% 100%  Weight:    86.5 kg  Height:       Physical Exam: General: Pleasant, no acute distress Neuro: Awake, alert, oriented x3 Psych: Normal cognition, mood, speech  Assessment/Plan: Leah Shea is 34yo female with complex partial seizure disorder, PTSD, anxiety, and recent COVID-19 infection admitted 11/4 with acute viral pharyngitis, now improved with continued symptoms of pleurisy, ready and stable for discharge.  Principal Problem:   Viral pharyngitis Active Problems:   Uvular swelling  #Acute viral pharyngitis #Pleurisy Patient reports throat continues to improve. She was able to eat soft foods this morning, such as eggs and grits. No abdominal pain, nausea, or vomiting. Continues to endorse pleuritic chest pain and left-sided tingling. Symptoms most likely due to pleuritic involvement of viral pneumonia. She also has generalized weakness with difficulty walking. PT evaluated yesterday, recommending home walker as she  improves. Expect chest pain, tingling, and weakness to gradually improve over the next week. Encouraged patient to continue drinking fluids and Ensure. Patient is allergic to NSAID's, recommend Tylenol for pain at home. She will follow-up in clinic next Tuesday. - Tylenol PRN for pain - Recommend fluids, Ensure for hydration - Appreciate PT recs and assistance, d/c with walker  #Anxiety Patient much improved over last few days since starting clonazepam. Discussed with patient this medication is to be used for short-term use only. Patient verbalized understanding. Reports she was previously on SSRI but was stopped per her PCP at the Tuesday. Mentions the medication helped at that time. Plan to give a few days of clonazepam and start SSRI upon discharge with plans to follow up in clinic for further management of anxiety. - Klonipin 0.25mg  BID x5 days - Start fluoxetine 10mg  qd  DIET: CM as tolerated IVF: n/a DVT PPX: Lovenox BOWEL: Miralax CODE: FULL FAM COM: Talked to husband and patient in the room yesterday. Discussed viral etiology of symptoms and gradual improvement with supportive care at home. Patient desires to go home soon.  Prior to Admission Living Arrangement: Home Anticipated Discharge Location: Home Barriers to Discharge: none Dispo: Anticipated discharge in approximately 0 day(s).   [place], MD 12/24/2019, 6:52 AM Pager: [phone] After 5pm on weekdays and 1pm on weekends: On Call pager [phone]

## 2019-12-24 NOTE — Progress Notes (Signed)
RN gave pt discharge instructions and the patient stated understanding. IV has been removed and pt stated that her husband would be on the way to pick her up soon. New medication escribed to home pharmacy.

## 2019-12-24 NOTE — Discharge Summary (Signed)
Name: Leah Shea MRN: [id number] DOB: 12/18/1985 34 y.o. PCP: [facility]  Date of Admission: 12/19/2019 11:51 AM Date of Discharge:  12/24/2019 Attending Physician: Tyson Alias, *  Discharge Diagnosis: 1. Acute viral pharyngitis 2. Pleurisy 2/2 viral pneumonia 3. Anxiety  Discharge Medications: Allergies as of 12/24/2019       Reactions   Other Anaphylaxis, Hives   Mushrooms   Nsaids Hives, Swelling, Other (See Comments)   Body burns   Sulfa Antibiotics Other (See Comments)   burns        Medication List     STOP taking these medications    benzonatate 100 MG capsule Commonly known as: TESSALON   cefdinir 300 MG capsule Commonly known as: OMNICEF   Cepacol Sore Throat 5.4 MG Lozg Generic drug: Menthol   escitalopram 10 MG tablet Commonly known as: LEXAPRO   fluticasone 50 MCG/ACT nasal spray Commonly known as: FLONASE   HYDROcodone-acetaminophen 5-325 MG tablet Commonly known as: NORCO/VICODIN   ondansetron 4 MG tablet Commonly known as: Zofran       TAKE these medications    acetaminophen 500 MG tablet Commonly known as: TYLENOL Take 2 tablets (1,000 mg total) by mouth every 8 (eight) hours as needed for moderate pain or fever.   busPIRone 15 MG tablet Commonly known as: BUSPAR Take 15 mg by mouth 2 (two) times daily.   clonazePAM 0.25 MG disintegrating tablet Commonly known as: KLONOPIN Take 1 tablet (0.25 mg total) by mouth 2 (two) times daily as needed for up to 5 days (anxiety).   cyclobenzaprine 10 MG tablet Commonly known as: FLEXERIL Take 10 mg by mouth at bedtime.   feeding supplement Liqd Take 237 mLs by mouth 2 (two) times daily between meals.   FLUoxetine 10 MG capsule Commonly known as: PROZAC Take 1 capsule (10 mg total) by mouth daily.   hydrOXYzine 25 MG tablet Commonly known as: ATARAX/VISTARIL Take 1 tablet (25 mg total) by mouth 2 (two) times daily as needed for anxiety. What  changed:   medication strength  how much to take   lamoTRIgine 200 MG tablet Commonly known as: LAMICTAL Take 100-200 mg by mouth See admin instructions. 100mg  in the morning 200mg  in the evening   levETIRAcetam 1000 MG tablet Commonly known as: KEPPRA Take 1,000 mg by mouth 3 (three) times daily.   mirtazapine 30 MG tablet Commonly known as: REMERON Take 30 mg by mouth at bedtime.   nicotine polacrilex 4 MG gum Commonly known as: NICORETTE Take 4 mg by mouth as needed for smoking cessation.   norethindrone 0.35 MG tablet Commonly known as: MICRONOR Take 1 tablet by mouth daily.   PRENATAL PO Take 1 tablet by mouth daily.               Durable Medical Equipment  (From admission, onward)           Start     Ordered   12/23/19 1639  For home use only DME 4 wheeled rolling walker with seat  Once       Question:  Patient needs a walker to treat with the following condition  Answer:  Weakness of both legs   12/23/19 1639            Disposition and follow-up:   [name] was discharged from [facility] in Stable condition.  At the hospital follow up visit please address:  Acute viral pharyngitis: At d/c throat improved, able to  tolerate soft foods. Encouraged advancing diet as tolerated. Supplemented with Ensure. Patient had generalized weakness requiring roller at d/c per PT (at baseline ambulates without assistance).   Pleurisy 2/2 viral pneumonia: Developed sudden-onset left sided pleuritic chest pain, left-sided tingling during hospitalization. Exam not consistent with neuro disorder. No evidence of PE/MI. No stroke risk factors. CXR w/ LLL PNA w/ pleural involvement. Had normal MRI in October at [facility] for evaluation for pituitary tumor. Encouraged tylenol PRN as patient cannot take NSAIDs.   Anxiety: Often tearful, very anxious during hospitalization. Believed to partially be 2/2 high-dose steroids. Improved after stopping.  Previously on SSRI, says it helped her in the past. D/c'd w/ 5d klonipin and started fluoxetine 10mg  qd. Can titrate as needed. Discussed w/ pt klonipin is only short-term.   *Needs continued follow-up for anxiety, can discuss establishing care at [facility]. Has PCP at [facility] but says they are always backed up.  2.  Labs / imaging needed at time of follow-up: BMP  3.  Pending labs/ test needing follow-up: none  Follow-up Appointments:  Follow-up Information     [facility] Follow up.   Why: Rolling Walker-to be delivered to the room prior to transition home.  Contact information: [address] [facility] [place] [phone]         [phone] [facility]. Go on 12/31/2019.   Specialty: Internal Medicine Why: You are scheduled for a hospital follow-up appointment on Tuesday, December 31, 2019 at 1:15PM. Please make sure to arrive 15 minutes before your scheduled time. We look forward to seeing you there! Contact information: [address] [place] [phone]               Hospital Course by problem list: 1. Acute viral pharyngitis: Patient presenting with dysphagia, odynophagia. ENT evaluated in ED w/ laryngoscope, airway patent. Started IV clinda, steroids. Group A strep, HIV, mononucleosis negative. CT soft tissue of neck negative for deep space infections. Afebrile without leukocytosis throughout stay, most likely viral etiology. Completed total 4d steroids. At discharge patient tolerating po and reported improvement of symptoms. Patient also weak given acute illness, PT recommending walker for home during recovery with expected improvement back to baseline.   2. Pleurisy 2/2 viral pneumonia: During hospitalization patient developed sudden-onset left-sided pleuritic chest pain and entire left-sided tingling. EKG normal. Low suspicion for neurological disorder given constellation of symptoms and neurological  exam. No risk factors for stroke. CXR concerning for LLL PNA with pleural involvement. Given oxycodone for pain while hospitalized. Encouraged Tylenol PRN at home as she cannot take NSAIDs.  3. Anxiety: Patient had worsening anxiety during admission, often tearful. Although patient has history of anxiety, high dose steroids believed to be exacerbating symptoms. After discontinuing steroids and starting klonipin PRN, patient with improved mood. Previously on SSRI, reports it helped her in the past. Discharged with klonipin PRN x5 days and starting fluoxetine 10mg  qd with plans to follow-up with [facility] for further evaluation.  Discharge Vitals:   BP 126/76 (BP Location: Left Arm)   Pulse (!) 58   Temp 98.3 F (36.8 C) (Oral)   Resp 18   Ht 5\' 6"  (1.676 m)   Wt 86.5 kg   SpO2 100%   BMI 30.78 kg/m   Pertinent Labs, Studies, and Procedures:  CBC Latest Ref Rng & Units 12/23/2019 12/22/2019 12/21/2019  WBC 4.0 - 10.5 K/uL 7.9 8.4 5.0  Hemoglobin 12.0 - 15.0 g/dL 13/09/2019 11.8(L) 12.4  Hematocrit  36 - 46 % 36.3 35.3(L) 36.7  Platelets 150 - 400 K/uL 162 174 189   CMP Latest Ref Rng & Units 12/24/2019 12/23/2019 12/22/2019  Glucose 70 - 99 mg/dL 98 884(Z) 660(Y)  BUN 6 - 20 mg/dL 14 8 8   Creatinine 0.44 - 1.00 mg/dL 3.01 6.01  Sodium 135 - 145 mmol/L 139 137 141  Potassium 3.5 - 5.1 mmol/L 3.4(L) 3.4(L) 3.6  Chloride 98 - 111 mmol/L 105 106 107  CO2 22 - 32 mmol/L 25 26 27   Calcium 8.9 - 10.3 mg/dL 8.9 0.93) )  Total Protein 6.5 - 8.1 g/dL - - -  Total Bilirubin 0.3 - 1.2 mg/dL - - -  Alkaline Phos 38 - 126 U/L - - -  AST 15 - 41 U/L - - -  ALT 0 - 44 U/L - - -   CT Soft Tissues Neck 12/20/19: Adenoid and lingual tonsillar hyperplasia with soft palate swelling. No abscess. Patent airway.  Discharge Instructions:   Ms. Vannostrand, I am glad you are feeling better! You were admitted for a viral infection of your throat. While you were here, you developed some pneumonia as well. Our lab work  shows that this is also likely viral. Your chest pain and left-sided tingling are also most likely due to this virus. These symptoms should gradually improve as your body clears the infection. Please see the following notes:  -Continue drinking plenty of fluids and nutrition. I have sent a prescription in for Ensure.   -I have also sent in a prescription for Klonipin (clonazepam) 0.25mg , twice daily as needed. This medication is for short-term use for anxiety.  -We will also start Prozac (fluoxetine) 10mg , once daily. This is also for anxiety. This medication should help over time. It will take some a week or two for you to feel the results. When we see you in clinic we can adjust the medication as needed.  -For the chest pain, you can take tylenol, up to 650mg  four times per day.  -Please follow-up in our clinic next Tuesday, December 31, 2019 at 1:!5PM. If you would like to, we can establish care as your new primary care doctor if you would like.   -We are also sending you home with a walker. Until your strength gets back, please make sure to use this walker when walking.  -If over the next week you feel worsening shortness of breath or a high fever, please go to an urgent care center or the Emergency Department.  It was a pleasure meeting you, Ms. Vanbergen. I wish you the best and hope you stay happy and healthy!  Thank you, Thressa Sheller, MD  Signed: , MD 12/24/2019, 10:39 AM   Pager: [phone]

## 2019-12-24 NOTE — Progress Notes (Signed)
Physical Therapy Treatment Patient Details Name: Leah Shea MRN: [id number] DOB: 1985/03/29 Today's Date: 12/24/2019    History of Present Illness Pt adm with acute pharyngitis. PMH - covid, anxiety, depression, PTSD. partial complex seizure disorder, sickle cell trait.    PT Comments    Continuing work on functional mobility and activity tolerance;  Walked the hallway with a 4Wheeled RW without physical assist; Cues to relax shoulders, and focus on filling the bottoms of lungs; Slow, deliberate steps; Walked on room air and O2 sats remained greater tahn or equal to 93%;   Provided encouragement to Leah Shea -- while she indicated her steps weren't any better, I did highlight the fact that her O2 sats remained stable with activity without the need for supplemental O2, and that in itself is progress; she indicated she is scared of going home and not being able to breathe; I reminded her that if she has trouble breathing, to simply stop, sit, and regroup -- like we did during the session;  She seemed a bit reassured; Ended session on 1L supplemental O2 for comfort; Pieter Partridge, RN aware;   SATURATION QUALIFICATIONS: (This note is used to comply with regulatory documentation for home oxygen)  Patient Saturations on Room Air at Rest = 95%  Patient Saturations on Room Air while Ambulating = 93%  Patient Saturations on (not applicable) Liters of oxygen while Ambulating =   No need for supplemental O2  Follow Up Recommendations  No PT follow up     Equipment Recommendations  Other (comment) (Rollator already in room)    Recommendations for Other Services  Outpt Counseling services     Precautions / Restrictions Precautions Precautions: None Restrictions Weight Bearing Restrictions: No    Mobility  Bed Mobility Overal bed mobility: Modified Independent             General bed mobility comments: Incr time  Transfers Overall transfer level: Needs assistance Equipment  used: 4-wheeled walker Transfers: Sit to/from Stand Sit to Stand: Min guard;Supervision         General transfer comment: On first sit to stand min guard for safety and incr time to rise. On second sit to stand improved and only supervision and cues for  rollator brakes, hand placement; slow turn to sit with no overt loss of baqlance  Ambulation/Gait Ambulation/Gait assistance: Min guard (without  physical contact) Gait Distance (Feet): 120 Feet (with one seated rest break) Assistive device: 4-wheeled walker Gait Pattern/deviations: Step-through pattern;Decreased step length - right;Decreased step length - left;Trunk flexed Gait velocity: slow, deliberate   General Gait Details: Slow, deliberate cadence with pt using UE's on walker to decr pain. In initial  52' pt with some knee softening but no buckling and able to continue without intervention   Stairs             Wheelchair Mobility    Modified Rankin (Stroke Patients Only)       Balance Overall balance assessment: No apparent balance deficits (not formally assessed)                                          Cognition Arousal/Alertness: Awake/alert Behavior During Therapy: Anxious Overall Cognitive Status: Within Functional Limits for tasks assessed  General Comments: Anxious and tearful      Exercises      General Comments General comments (skin integrity, edema, etc.): Session conducted on room air and O2 sats remained greater than or equal to 93% throughout       Pertinent Vitals/Pain Pain Assessment: 0-10 Pain Score: 7  Pain Location: throat and hard to breathe; pins and needles still on L side Pain Descriptors / Indicators: Pins and needles Pain Intervention(s): Monitored during session    Home Living                      Prior Function            PT Goals (current goals can now be found in the care plan section) Acute  Rehab PT Goals Patient Stated Goal: Breathe better PT Goal Formulation: With patient Time For Goal Achievement: 12/30/19 Potential to Achieve Goals: Good Progress towards PT goals: Progressing toward goals    Frequency    Min 3X/week      PT Plan Current plan remains appropriate    Co-evaluation              AM-PAC PT "6 Clicks" Mobility   Outcome Measure  Help needed turning from your back to your side while in a flat bed without using bedrails?: None Help needed moving from lying on your back to sitting on the side of a flat bed without using bedrails?: None Help needed moving to and from a bed to a chair (including a wheelchair)?: A Little Help needed standing up from a chair using your arms (e.g., wheelchair or bedside chair)?: A Little Help needed to walk in hospital room?: A Little Help needed climbing 3-5 steps with a railing? : A Little 6 Click Score: 20    End of Session   Activity Tolerance: Patient tolerated treatment well;Patient limited by pain Patient left: in bed;with call bell/phone within reach Nurse Communication: Mobility status PT Visit Diagnosis: Other abnormalities of gait and mobility (R26.89);Pain Pain - Right/Left: Left Pain - part of body:  (entire side)     Time: 1610-9604 PT Time Calculation (min) (ACUTE ONLY): 34 min  Charges:  $Gait Training: 23-37 mins                     Van Clines, [place]  Acute Rehabilitation Services Pager [phone] Office [phone]    Levi Aland 12/24/2019, 11:26 AM

## 2019-12-24 NOTE — Telephone Encounter (Signed)
Hospital TOC per Dr. Marijo Conception, discharge 12/24/2019 appt 12/31/2019.

## 2019-12-26 NOTE — Telephone Encounter (Signed)
Transition Care Management Unsuccessful Follow-up Telephone Call  Date of discharge and from where:  12/24/2019 from [facility]  Attempts:  1st Attempt  Reason for unsuccessful TCM follow-up call:  Left voice message requesting return call as well as date/time of HFU (12/31/2019 at 1:15). Kinnie Feil, BSN, RN-BC

## 2019-12-31 ENCOUNTER — Encounter: Payer: Medicaid Other | Admitting: Internal Medicine

## 2020-01-23 ENCOUNTER — Emergency Department (HOSPITAL_COMMUNITY)
Admission: EM | Admit: 2020-01-23 | Discharge: 2020-01-23 | Disposition: A | Discharge status: Home / Self Care | Payer: No Typology Code available for payment source | Attending: Emergency Medicine | Admitting: Emergency Medicine

## 2020-01-23 ENCOUNTER — Encounter (HOSPITAL_COMMUNITY): Payer: Self-pay | Admitting: Emergency Medicine

## 2020-01-23 DIAGNOSIS — U071 COVID-19: Secondary | ICD-10-CM | POA: Insufficient documentation

## 2020-01-23 DIAGNOSIS — J45909 Unspecified asthma, uncomplicated: Secondary | ICD-10-CM | POA: Diagnosis not present

## 2020-01-23 DIAGNOSIS — Z87891 Personal history of nicotine dependence: Secondary | ICD-10-CM | POA: Insufficient documentation

## 2020-01-23 DIAGNOSIS — J029 Acute pharyngitis, unspecified: Secondary | ICD-10-CM | POA: Insufficient documentation

## 2020-01-23 DIAGNOSIS — R070 Pain in throat: Secondary | ICD-10-CM | POA: Diagnosis present

## 2020-01-23 LAB — CBC WITH DIFFERENTIAL/PLATELET
Abs Immature Granulocytes: 0.03 10*3/uL (ref 0.00–0.07)
Basophils Absolute: 0.1 10*3/uL (ref 0.0–0.1)
Basophils Relative: 1 %
Eosinophils Absolute: 0.3 10*3/uL (ref 0.0–0.5)
Eosinophils Relative: 5 %
HCT: 37.5 % (ref 36.0–46.0)
Hemoglobin: 13 g/dL (ref 12.0–15.0)
Immature Granulocytes: 0 %
Lymphocytes Relative: 44 %
Lymphs Abs: 3.2 10*3/uL (ref 0.7–4.0)
MCH: 31.3 pg (ref 26.0–34.0)
MCHC: 34.7 g/dL (ref 30.0–36.0)
MCV: 90.4 fL (ref 80.0–100.0)
Monocytes Absolute: 0.6 10*3/uL (ref 0.1–1.0)
Monocytes Relative: 9 %
Neutro Abs: 3 10*3/uL (ref 1.7–7.7)
Neutrophils Relative %: 41 %
Platelets: 229 10*3/uL (ref 150–400)
RBC: 4.15 MIL/uL (ref 3.87–5.11)
RDW: 12.2 % (ref 11.5–15.5)
WBC: 7.2 10*3/uL (ref 4.0–10.5)
nRBC: 0 % (ref 0.0–0.2)

## 2020-01-23 LAB — BASIC METABOLIC PANEL
Anion gap: 13 (ref 5–15)
BUN: 12 mg/dL (ref 6–20)
CO2: 19 mmol/L — ABNORMAL LOW (ref 22–32)
Calcium: 9.6 mg/dL (ref 8.9–10.3)
Chloride: 107 mmol/L (ref 98–111)
Creatinine, Ser: 0.8 mg/dL (ref 0.44–1.00)
GFR, Estimated: >60 mL/min (ref >60)
Glucose, Bld: 88 mg/dL (ref 70–99)
Potassium: 3.6 mmol/L (ref 3.5–5.1)
Sodium: 139 mmol/L (ref 135–145)

## 2020-01-23 LAB — MONONUCLEOSIS SCREEN: Mono Screen: NEGATIVE

## 2020-01-23 LAB — RESP PANEL BY RT-PCR (FLU A&B, COVID) ARPGX2
Influenza A by PCR: NEGATIVE
Influenza B by PCR: NEGATIVE
SARS Coronavirus 2 by RT PCR: POSITIVE — AB

## 2020-01-23 LAB — GROUP A STREP BY PCR: Group A Strep by PCR: NOT DETECTED

## 2020-01-23 MED ORDER — DEXAMETHASONE SODIUM PHOSPHATE 10 MG/ML IJ SOLN
10.0000 mg | Freq: Once | INTRAMUSCULAR | Status: AC
  Administered 2020-01-23: 10 mg via INTRAVENOUS
  Filled 2020-01-23: qty 1

## 2020-01-23 MED ORDER — PREDNISONE 10 MG (21) PO TBPK: ORAL_TABLET | ORAL | 0 refills | Status: DC

## 2020-01-23 MED ORDER — HYDROCODONE-ACETAMINOPHEN 7.5-325 MG/15ML PO SOLN: 15.0000 mL | Freq: Four times a day (QID) | ORAL | 0 refills | Status: DC | PRN

## 2020-01-23 MED ORDER — SODIUM CHLORIDE 0.9 % IV BOLUS
1000.0000 mL | Freq: Once | INTRAVENOUS | Status: AC
  Administered 2020-01-23: 1000 mL via INTRAVENOUS

## 2020-01-23 MED ORDER — FENTANYL CITRATE (PF) 100 MCG/2ML IJ SOLN
50.0000 ug | Freq: Once | INTRAMUSCULAR | Status: AC
  Administered 2020-01-23: 50 ug via INTRAVENOUS
  Filled 2020-01-23: qty 2

## 2020-01-23 NOTE — ED Triage Notes (Signed)
Pt reports uvula swelling since this morning, hx of the same 1 month ago.

## 2020-01-23 NOTE — ED Notes (Addendum)
At change of shift pt aao4, gcs20, nadn, vss on ccm. Side rails up, call bell in place. Pt requesting pain medicine . Fentanyl wasted with josh rn

## 2020-01-23 NOTE — ED Provider Notes (Signed)
[facility] EMERGENCY DEPARTMENT Provider Note  CSN: [id number] Arrival date & time: 01/23/20 1307    History Chief Complaint  Patient presents with  . Sore Throat    HPI  Leah Shea is a 34 y.o. female with  PMH or pituitary tumor and seizures, reports she woke up this Shea with sore throat, general malaise and myalgias. She gargled with warm salt water and went back to bed but woke up a few hours later with chills and worsening sore throat. She was in the ED for similar about a month ago and admitted for uvular edema, seen by ENT who did a scope and found no airway, epiglottic or laryngeal involvement; felt to be infectious etiology at the time. She improved after several days of steroids and clindamycin and ultimately went home. She had been doing well until this Shea.    Past Medical History:  Diagnosis Date  . Anemia   . Anxiety   . Anxiety   . Asthma   . Blood dyscrasia    sickle trait  . Bronchitis, acute   . Depression   . Diverticular disease   . Family history of anesthesia complication    son due sleep apnea  . Family history of breast cancer   . Family history of prostate cancer   . Headache(784.0)    migraines  . PTSD (post-traumatic stress disorder)   . Shortness of breath    with exertion or panic attack  . Sickle cell anemia (HCC) trait   patient claims asymptomatic  . Sleep apnea    awaiting a sleep study to be done at [facility]    Past Surgical History:  Procedure Laterality Date  . BREAST SURGERY Bilateral    reductions  . CESAREAN SECTION  2010, [id number]   x 3  . EYE SURGERY     x 10 as a child  . TONSILLECTOMY N/A 10/07/2013   Procedure: TONSILLECTOMY;  Surgeon: Christia Reading, MD;  Location: [facility] OR;  Service: ENT;  Laterality: N/A;  . TUMOR REMOVAL     tumor removed from back    Family History  Problem Relation Age of Onset  . Breast cancer Mother   . Breast cancer Maternal Aunt 36       metastatic  . Cancer Maternal Uncle         unk type  . Breast cancer Maternal Grandmother   . Prostate cancer Maternal Grandfather        metastatic  . Cancer Maternal Aunt        either breast or ovarian     Social History   Tobacco Use  . Smoking status: Former Smoker    Packs/day: 0.25    Years: 1.00    Pack years: 0.25    Types: Cigarettes    Quit date: 07/02/2013    Years since quitting: 6.5  . Smokeless tobacco: Never Used  Vaping Use  . Vaping Use: Never used  Substance Use Topics  . Alcohol use: Not Currently  . Drug use: Yes    Types: Marijuana     Home Medications Prior to Admission medications   Medication Sig Start Date End Date Taking? Authorizing Provider  acetaminophen (TYLENOL) 500 MG tablet Take 2 tablets (1,000 mg total) by mouth every 8 (eight) hours as needed for moderate pain or fever. 10/25/19  Yes Darr, Gerilyn Pilgrim, PA-C  busPIRone (BUSPAR) 15 MG tablet Take 15 mg by mouth 2 (two) times daily.   Yes [provider]  cyclobenzaprine (FLEXERIL) 10 MG tablet Take 10 mg by mouth at bedtime.   Yes [provider]  feeding supplement (ENSURE ENLIVE / ENSURE PLUS) LIQD Take 237 mLs by mouth 2 (two) times daily between meals. 12/24/19  Yes Evlyn KannerBraswell, Phillip, MD  FLUoxetine (PROZAC) 10 MG capsule Take 1 capsule (10 mg total) by mouth daily. 12/24/19 01/23/20 Yes Evlyn KannerBraswell, Phillip, MD  hydrOXYzine (ATARAX/VISTARIL) 25 MG tablet Take 1 tablet (25 mg total) by mouth 2 (two) times daily as needed for anxiety. 12/24/19  Yes Evlyn KannerBraswell, Phillip, MD  lamoTRIgine (LAMICTAL) 200 MG tablet Take 100-200 mg by mouth See admin instructions. 100mg  in the Shea 200mg  in the evening   Yes [provider]  levETIRAcetam (KEPPRA) 1000 MG tablet Take 1,000 mg by mouth 3 (three) times daily.   Yes [provider]  mirtazapine (REMERON) 30 MG tablet Take 30 mg by mouth at bedtime.   Yes [provider]  clonazePAM (KLONOPIN) 0.25 MG disintegrating tablet Take 1 tablet (0.25 mg total)  by mouth 2 (two) times daily as needed for up to 5 days (anxiety). 12/24/19 12/29/19  Evlyn KannerBraswell, Phillip, MD  HYDROcodone-acetaminophen (HYCET) 7.5-325 mg/15 ml solution Take 15 mLs by mouth 4 (four) times daily as needed for moderate pain. 01/23/20 01/22/21  Pollyann SavoySheldon, Coralie Stanke B, MD  predniSONE (STERAPRED UNI-PAK 21 TAB) 10 MG (21) TBPK tablet 10mg  Tabs, 6 day taper. Use as directed 01/23/20   Pollyann SavoySheldon, Jerimey Burridge B, MD     Allergies    Other, Nsaids, and Sulfa antibiotics   Review of Systems   Review of Systems A comprehensive review of systems was completed and negative except as noted in HPI.    Physical Exam BP (!) 144/93   Pulse 86   Temp 99.1 F (37.3 C) (Oral)   Resp 14   Ht 5\' 6"  (1.676 m)   Wt 81.6 kg   SpO2 97%   BMI 29.05 kg/m   Physical Exam Vitals and nursing note reviewed.  Constitutional:      Appearance: Normal appearance.  HENT:     Head: Normocephalic and atraumatic.     Nose: Nose normal.     Mouth/Throat:     Mouth: Mucous membranes are moist.     Comments: Moderate erythema and swelling to bilateral tonsils, uvula and soft palate Eyes:     Extraocular Movements: Extraocular movements intact.     Conjunctiva/sclera: Conjunctivae normal.  Cardiovascular:     Rate and Rhythm: Normal rate.  Pulmonary:     Effort: Pulmonary effort is normal.     Breath sounds: No stridor.  Abdominal:     General: Abdomen is flat.     Palpations: Abdomen is soft.     Tenderness: There is no abdominal tenderness.  Musculoskeletal:        General: No swelling. Normal range of motion.     Cervical back: Neck supple.  Lymphadenopathy:     Cervical: No cervical adenopathy.  Skin:    General: Skin is warm and dry.  Neurological:     General: No focal deficit present.     Mental Status: She is alert.  Psychiatric:        Mood and Affect: Mood normal.      ED Results / Procedures / Treatments   Labs (all labs ordered are listed, but only abnormal results are  displayed) Labs Reviewed  RESP PANEL BY RT-PCR (FLU A&B, COVID) ARPGX2 - Abnormal; Notable for the following components:  Result Value   SARS Coronavirus 2 by RT PCR POSITIVE (*)    All other components within normal limits  BASIC METABOLIC PANEL - Abnormal; Notable for the following components:   CO2 19 (*)    All other components within normal limits  GROUP A STREP BY PCR  RESPIRATORY PANEL BY PCR  CBC WITH DIFFERENTIAL/PLATELET  MONONUCLEOSIS SCREEN    EKG None  Radiology No results found.  Procedures Procedures  Medications Ordered in the ED Medications  sodium chloride 0.9 % bolus 1,000 mL (0 mLs Intravenous Stopped 01/23/20 1959)  dexamethasone (DECADRON) injection 10 mg (10 mg Intravenous Given 01/23/20 1906)  fentaNYL (SUBLIMAZE) injection 50 mcg (50 mcg Intravenous Given 01/23/20 1958)     MDM Rules/Calculators/A&P MDM Patient with recurrent pharyngitis with uvular involvement. No signs of airway compromise. Will check labs, including strep, mono and RVP to eval source infection. Decadron and fluids for symptomatic care.  ED Course  I have reviewed the triage vital signs and the nursing notes.  Pertinent labs & imaging results that were available during my care of the patient were reviewed by me and considered in my medical decision making (see chart for details).  Clinical Course as of 01/23/20 2159  Thu Jan 23, 2020  1932 CBC is normal.  [CS]  1947 BMP is normal.  [CS]  2018 Mono is negative [CS]  2106 Strep is negative.  [CS]  2129 Patient reports some improvement. She continues to have no signs of airway compromise. 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 Her Covid/Flu tests are pending. Regardless of those results, I do not see an indication for admission today. Symptoms are likely  viral in nature, so no indication for antibiotics today. Plan discharge with oral steroids, pain medications for comfort. Recommend aggressive hydration at home and outpatient ENT follow up for reassessment when her symptoms are improved. Patient is amenable to this plan and would like to go home as well.  [CS]  2157 Covid is positive. Patient also had positive Covid about 3 months ago, did not get MAB then. Will refer to MAB clinic as well for consideration. In the meantime, she was given standard quarantine instructions. Leah Shea was evaluated in Emergency Department on 01/23/2020 for the symptoms described in the history of present illness. She was evaluated in the context of the global COVID-19 pandemic, which necessitated consideration that the patient might be at risk for infection with the SARS-CoV-2 virus that causes COVID-19. Institutional protocols and algorithms that pertain to the evaluation of patients at risk for COVID-19 are in a state of rapid change based on information released by regulatory bodies including the CDC and federal and state organizations. These policies and algorithms were followed during the patient's care in the ED.   [CS]    Clinical Course User Index [CS] Pollyann Savoy, MD    Final Clinical Impression(s) / ED Diagnoses Final diagnoses:  Viral pharyngitis  COVID-19    Rx / DC Orders ED Discharge Orders         Ordered    HYDROcodone-acetaminophen (HYCET) 7.5-325 mg/15 ml solution  4 times daily PRN        01/23/20 2136    predniSONE (STERAPRED UNI-PAK 21 TAB) 10 MG (21) TBPK tablet        01/23/20 2136  Pollyann Savoy, MD 01/23/20 2200

## 2020-01-24 ENCOUNTER — Telehealth: Payer: Self-pay | Admitting: Unknown Physician Specialty

## 2020-01-24 ENCOUNTER — Other Ambulatory Visit: Payer: Self-pay | Admitting: Unknown Physician Specialty

## 2020-01-24 DIAGNOSIS — U071 COVID-19: Secondary | ICD-10-CM

## 2020-01-24 DIAGNOSIS — E663 Overweight: Secondary | ICD-10-CM

## 2020-01-24 NOTE — Telephone Encounter (Signed)
I connected by phone with Leah Shea on 01/24/2020 at 4:02 PM to discuss the potential use of a new treatment for mild to moderate COVID-19 viral infection in non-hospitalized patients.  This patient is a 34 y.o. female that meets the FDA criteria for Emergency Use Authorization of COVID monoclonal antibody casirivimab/imdevimab, bamlanivimab/eteseviamb, or sotrovimab.  Has a (+) direct SARS-CoV-2 viral test result  Has mild or moderate COVID-19   Is NOT hospitalized due to COVID-19  Is within 10 days of symptom onset  Has at least one of the high risk factor(s) for progression to severe COVID-19 and/or hospitalization as defined in EUA.  Specific high risk criteria : BMI > 25   I have spoken and communicated the following to the patient or parent/caregiver regarding COVID monoclonal antibody treatment:  1. FDA has authorized the emergency use for the treatment of mild to moderate COVID-19 in adults and pediatric patients with positive results of direct SARS-CoV-2 viral testing who are 17 years of age and older weighing at least 40 kg, and who are at high risk for progressing to severe COVID-19 and/or hospitalization.  2. The significant known and potential risks and benefits of COVID monoclonal antibody, and the extent to which such potential risks and benefits are unknown.  3. Information on available alternative treatments and the risks and benefits of those alternatives, including clinical trials.  4. Patients treated with COVID monoclonal antibody should continue to self-isolate and use infection control measures (e.g., wear mask, isolate, social distance, avoid sharing personal items, clean and disinfect "high touch" surfaces, and frequent handwashing) according to CDC guidelines.   5. The patient or parent/caregiver has the option to accept or refuse COVID monoclonal antibody treatment.  After reviewing this information with the patient, the patient has agreed to receive  one of the available covid 19 monoclonal antibodies and will be provided an appropriate fact sheet prior to infusion. Gabriel Cirri, NP 01/24/2020 4:02 PM  Sx onset 12/10 and will receive mab pending ID review

## 2020-01-25 ENCOUNTER — Other Ambulatory Visit: Payer: Self-pay | Admitting: Student

## 2020-01-25 ENCOUNTER — Ambulatory Visit (HOSPITAL_COMMUNITY)
Admission: RE | Admit: 2020-01-25 | Discharge: 2020-01-25 | Disposition: A | Discharge status: Home / Self Care | Payer: No Typology Code available for payment source | Source: Ambulatory Visit | Attending: Pulmonary Disease | Admitting: Pulmonary Disease

## 2020-01-25 DIAGNOSIS — E663 Overweight: Secondary | ICD-10-CM | POA: Diagnosis not present

## 2020-01-25 DIAGNOSIS — U071 COVID-19: Secondary | ICD-10-CM | POA: Diagnosis present

## 2020-01-25 MED ORDER — ALBUTEROL SULFATE HFA 108 (90 BASE) MCG/ACT IN AERS: 2.0000 | INHALATION_SPRAY | Freq: Once | RESPIRATORY_TRACT | Status: DC | PRN

## 2020-01-25 MED ORDER — FAMOTIDINE IN NACL 20-0.9 MG/50ML-% IV SOLN: 20.0000 mg | Freq: Once | INTRAVENOUS | Status: DC | PRN

## 2020-01-25 MED ORDER — DIPHENHYDRAMINE HCL 50 MG/ML IJ SOLN
50.0000 mg | Freq: Once | INTRAMUSCULAR | Status: AC | PRN
  Administered 2020-01-25: 16:00:00 50 mg via INTRAVENOUS
  Filled 2020-01-25: qty 1

## 2020-01-25 MED ORDER — SODIUM CHLORIDE 0.9 % IV SOLN: Freq: Once | INTRAVENOUS | Status: AC

## 2020-01-25 MED ORDER — METHYLPREDNISOLONE SODIUM SUCC 125 MG IJ SOLR: 125.0000 mg | Freq: Once | INTRAMUSCULAR | Status: DC | PRN

## 2020-01-25 MED ORDER — EPINEPHRINE 0.3 MG/0.3ML IJ SOAJ: 0.3000 mg | Freq: Once | INTRAMUSCULAR | Status: DC | PRN

## 2020-01-25 MED ORDER — SODIUM CHLORIDE 0.9 % IV SOLN: INTRAVENOUS | Status: DC | PRN

## 2020-01-25 NOTE — Progress Notes (Addendum)
  Diagnosis: COVID-19  Physician: Dr. Delford Field   Procedure: Covid Infusion Clinic Med: bamlanivimab\etesevimab infusion - Provided patient with bamlanimivab\etesevimab fact sheet for patients, parents and caregivers prior to infusion.  Complications: felt tingling in mouth and tip of tongue and itching.  No rash noticeable. Patient was given Benadryl 50 mg IV. Advised to call a ride home since she drove her car. Advised she may be sleepy after being administered Benadryl. Her infusion was decreased back to 150 ml/min. Will continue to monitor.     Discharge: patient IV was removed. Explained AVS and was given number to call if she continues to have problems at home. Patient states she feels she is able to go home. Waiting for husband or brother to arrive.  Guy Franco 01/25/2020

## 2020-01-25 NOTE — Progress Notes (Signed)
Patient reviewed Fact Sheet for Patients, Parents, and Caregivers for Emergency Use Authorization (EUA) of Casi- Regen-Cov for the Treatment of Coronavirus. Patient also reviewed and is agreeable to the estimated cost of treatment. Patient is agreeable to proceed.

## 2020-01-25 NOTE — Discharge Instructions (Signed)
10 Things You Can Do to Manage Your COVID-19 Symptoms at Home If you have possible or confirmed COVID-19: 1. Stay home from work and school. And stay away from other public places. If you must go out, avoid using any kind of public transportation, ridesharing, or taxis. 2. Monitor your symptoms carefully. If your symptoms get worse, call your healthcare provider immediately. 3. Get rest and stay hydrated. 4. If you have a medical appointment, call the healthcare provider ahead of time and tell them that you have or may have COVID-19. 5. For medical emergencies, call 911 and notify the dispatch personnel that you have or may have COVID-19. 6. Cover your cough and sneezes with a tissue or use the inside of your elbow. 7. Wash your hands often with soap and water for at least 20 seconds or clean your hands with an alcohol-based hand sanitizer that contains at least 60% alcohol. 8. As much as possible, stay in a specific room and away from other people in your home. Also, you should use a separate bathroom, if available. If you need to be around other people in or outside of the home, wear a mask. 9. Avoid sharing personal items with other people in your household, like dishes, towels, and bedding. 10. Clean all surfaces that are touched often, like counters, tabletops, and doorknobs. Use household cleaning sprays or wipes according to the label instructions. cdc.gov/coronavirus 08/15/2018 This information is not intended to replace advice given to you by your health care provider. Make sure you discuss any questions you have with your health care provider. Document Revised: 01/17/2019 Document Reviewed: 01/17/2019 Elsevier Patient Education  2020 Elsevier Inc.  COVID-19: How to Protect Yourself and Others Know how it spreads  There is currently no vaccine to prevent coronavirus disease 2019 (COVID-19).  The best way to prevent illness is to avoid being exposed to this virus.  The virus is  thought to spread mainly from person-to-person. ? Between people who are in close contact with one another (within about 6 feet). ? Through respiratory droplets produced when an infected person coughs, sneezes or talks. ? These droplets can land in the mouths or noses of people who are nearby or possibly be inhaled into the lungs. ? COVID-19 may be spread by people who are not showing symptoms. Everyone should Clean your hands often  Wash your hands often with soap and water for at least 20 seconds especially after you have been in a public place, or after blowing your nose, coughing, or sneezing.  If soap and water are not readily available, use a hand sanitizer that contains at least 60% alcohol. Cover all surfaces of your hands and rub them together until they feel dry.  Avoid touching your eyes, nose, and mouth with unwashed hands. Avoid close contact  Limit contact with others as much as possible.  Avoid close contact with people who are sick.  Put distance between yourself and other people. ? Remember that some people without symptoms may be able to spread virus. ? This is especially important for people who are at higher risk of getting very sick.www.cdc.gov/coronavirus/2019-ncov/need-extra-precautions/people-at-higher-risk.html Cover your mouth and nose with a mask when around others  You could spread COVID-19 to others even if you do not feel sick.  Everyone should wear a mask in public settings and when around people not living in their household, especially when social distancing is difficult to maintain. ? Masks should not be placed on young children under age 2, anyone who   has trouble breathing, or is unconscious, incapacitated or otherwise unable to remove the mask without assistance.  The mask is meant to protect other people in case you are infected.  Do NOT use a facemask meant for a Research scientist (physical sciences).  Continue to keep about 6 feet between yourself and others. The  mask is not a substitute for social distancing. Cover coughs and sneezes  Always cover your mouth and nose with a tissue when you cough or sneeze or use the inside of your elbow.  Throw used tissues in the trash.  Immediately wash your hands with soap and water for at least 20 seconds. If soap and water are not readily available, clean your hands with a hand sanitizer that contains at least 60% alcohol. Clean and disinfect  Clean AND disinfect frequently touched surfaces daily. This includes tables, doorknobs, light switches, countertops, handles, desks, phones, keyboards, toilets, faucets, and sinks. [employer]timeonline.com  If surfaces are dirty, clean them: Use detergent or soap and water prior to disinfection.  Then, use a household disinfectant. You can see a list of EPA-registered household disinfectants here. [employer] 10/17/2018 This information is not intended to replace advice given to you by your health care provider. Make sure you discuss any questions you have with your health care provider. Document Revised: 10/25/2018 Document Reviewed: 08/23/2018 Elsevier Patient Education  2020 [employer].   What types of side effects do monoclonal antibody drugs cause?  Common side effects  In general, the more common side effects caused by monoclonal antibody drugs include: . Allergic reactions, such as hives or itching . Flu-like signs and symptoms, including chills, fatigue, fever, and muscle aches and pains . Nausea, vomiting . Diarrhea . Skin rashes . Low blood pressure   The CDC is recommending patients who receive monoclonal antibody treatments wait at least 90 days before being vaccinated.  Currently, there are no data on the safety and efficacy of mRNA COVID-19 vaccines in persons who received monoclonal antibodies or convalescent plasma as part of COVID-19 treatment. Based on the estimated half-life  of such therapies as well as evidence suggesting that reinfection is uncommon in the 90 days after initial infection, vaccination should be deferred for at least 90 days, as a precautionary measure until additional information becomes available, to avoid interference of the antibody treatment with vaccine-induced immune responses.  If you have any questions or concerns after the infusion please call the Advanced Practice Provider on call at [phone]. This number is ONLY intended for your use regarding questions or concerns about the infusion post-treatment side-effects.  Please do not provide this number to others for use. For return to work notes please contact your primary care provider.   If someone you know is interested in receiving treatment please have them call the COVID hotline at [phone].

## 2020-01-27 ENCOUNTER — Encounter (HOSPITAL_COMMUNITY): Payer: Self-pay

## 2020-01-27 ENCOUNTER — Emergency Department (HOSPITAL_COMMUNITY): Payer: No Typology Code available for payment source

## 2020-01-27 ENCOUNTER — Emergency Department (HOSPITAL_COMMUNITY)
Admission: EM | Admit: 2020-01-27 | Discharge: 2020-01-27 | Disposition: A | Discharge status: Home / Self Care | Payer: No Typology Code available for payment source | Attending: Emergency Medicine | Admitting: Emergency Medicine

## 2020-01-27 ENCOUNTER — Other Ambulatory Visit: Payer: Self-pay

## 2020-01-27 DIAGNOSIS — U071 COVID-19: Secondary | ICD-10-CM | POA: Diagnosis not present

## 2020-01-27 DIAGNOSIS — R0602 Shortness of breath: Secondary | ICD-10-CM | POA: Diagnosis present

## 2020-01-27 DIAGNOSIS — J45909 Unspecified asthma, uncomplicated: Secondary | ICD-10-CM | POA: Insufficient documentation

## 2020-01-27 DIAGNOSIS — Z87891 Personal history of nicotine dependence: Secondary | ICD-10-CM | POA: Insufficient documentation

## 2020-01-27 DIAGNOSIS — J029 Acute pharyngitis, unspecified: Secondary | ICD-10-CM

## 2020-01-27 LAB — COMPREHENSIVE METABOLIC PANEL
ALT: 15 U/L (ref 0–44)
AST: 12 U/L — ABNORMAL LOW (ref 15–41)
Albumin: 3.7 g/dL (ref 3.5–5.0)
Alkaline Phosphatase: 53 U/L (ref 38–126)
Anion gap: 8 (ref 5–15)
BUN: 12 mg/dL (ref 6–20)
CO2: 23 mmol/L (ref 22–32)
Calcium: 8.9 mg/dL (ref 8.9–10.3)
Chloride: 106 mmol/L (ref 98–111)
Creatinine, Ser: 0.85 mg/dL (ref 0.44–1.00)
GFR, Estimated: >60 mL/min (ref >60)
Glucose, Bld: 106 mg/dL — ABNORMAL HIGH (ref 70–99)
Potassium: 3.7 mmol/L (ref 3.5–5.1)
Sodium: 137 mmol/L (ref 135–145)
Total Bilirubin: 0.2 mg/dL — ABNORMAL LOW (ref 0.3–1.2)
Total Protein: 6.6 g/dL (ref 6.5–8.1)

## 2020-01-27 LAB — CBC WITH DIFFERENTIAL/PLATELET
Abs Immature Granulocytes: 0.02 10*3/uL (ref 0.00–0.07)
Basophils Absolute: 0.1 10*3/uL (ref 0.0–0.1)
Basophils Relative: 1 %
Eosinophils Absolute: 0.4 10*3/uL (ref 0.0–0.5)
Eosinophils Relative: 5 %
HCT: 36.1 % (ref 36.0–46.0)
Hemoglobin: 12.3 g/dL (ref 12.0–15.0)
Immature Granulocytes: 0 %
Lymphocytes Relative: 40 %
Lymphs Abs: 2.7 10*3/uL (ref 0.7–4.0)
MCH: 30.7 pg (ref 26.0–34.0)
MCHC: 34.1 g/dL (ref 30.0–36.0)
MCV: 90 fL (ref 80.0–100.0)
Monocytes Absolute: 0.6 10*3/uL (ref 0.1–1.0)
Monocytes Relative: 8 %
Neutro Abs: 3.2 10*3/uL (ref 1.7–7.7)
Neutrophils Relative %: 46 %
Platelets: 190 10*3/uL (ref 150–400)
RBC: 4.01 MIL/uL (ref 3.87–5.11)
RDW: 12.1 % (ref 11.5–15.5)
WBC: 6.9 10*3/uL (ref 4.0–10.5)
nRBC: 0 % (ref 0.0–0.2)

## 2020-01-27 IMAGING — DX DG CHEST 1V PORT
1 series · 1 of 1 positions shown · non-contrast
Comparison: [DATE]

CLINICAL DATA: Shortness of breath.  History of COVID infection.

EXAM:
PORTABLE CHEST 1 VIEW

[chest ap]
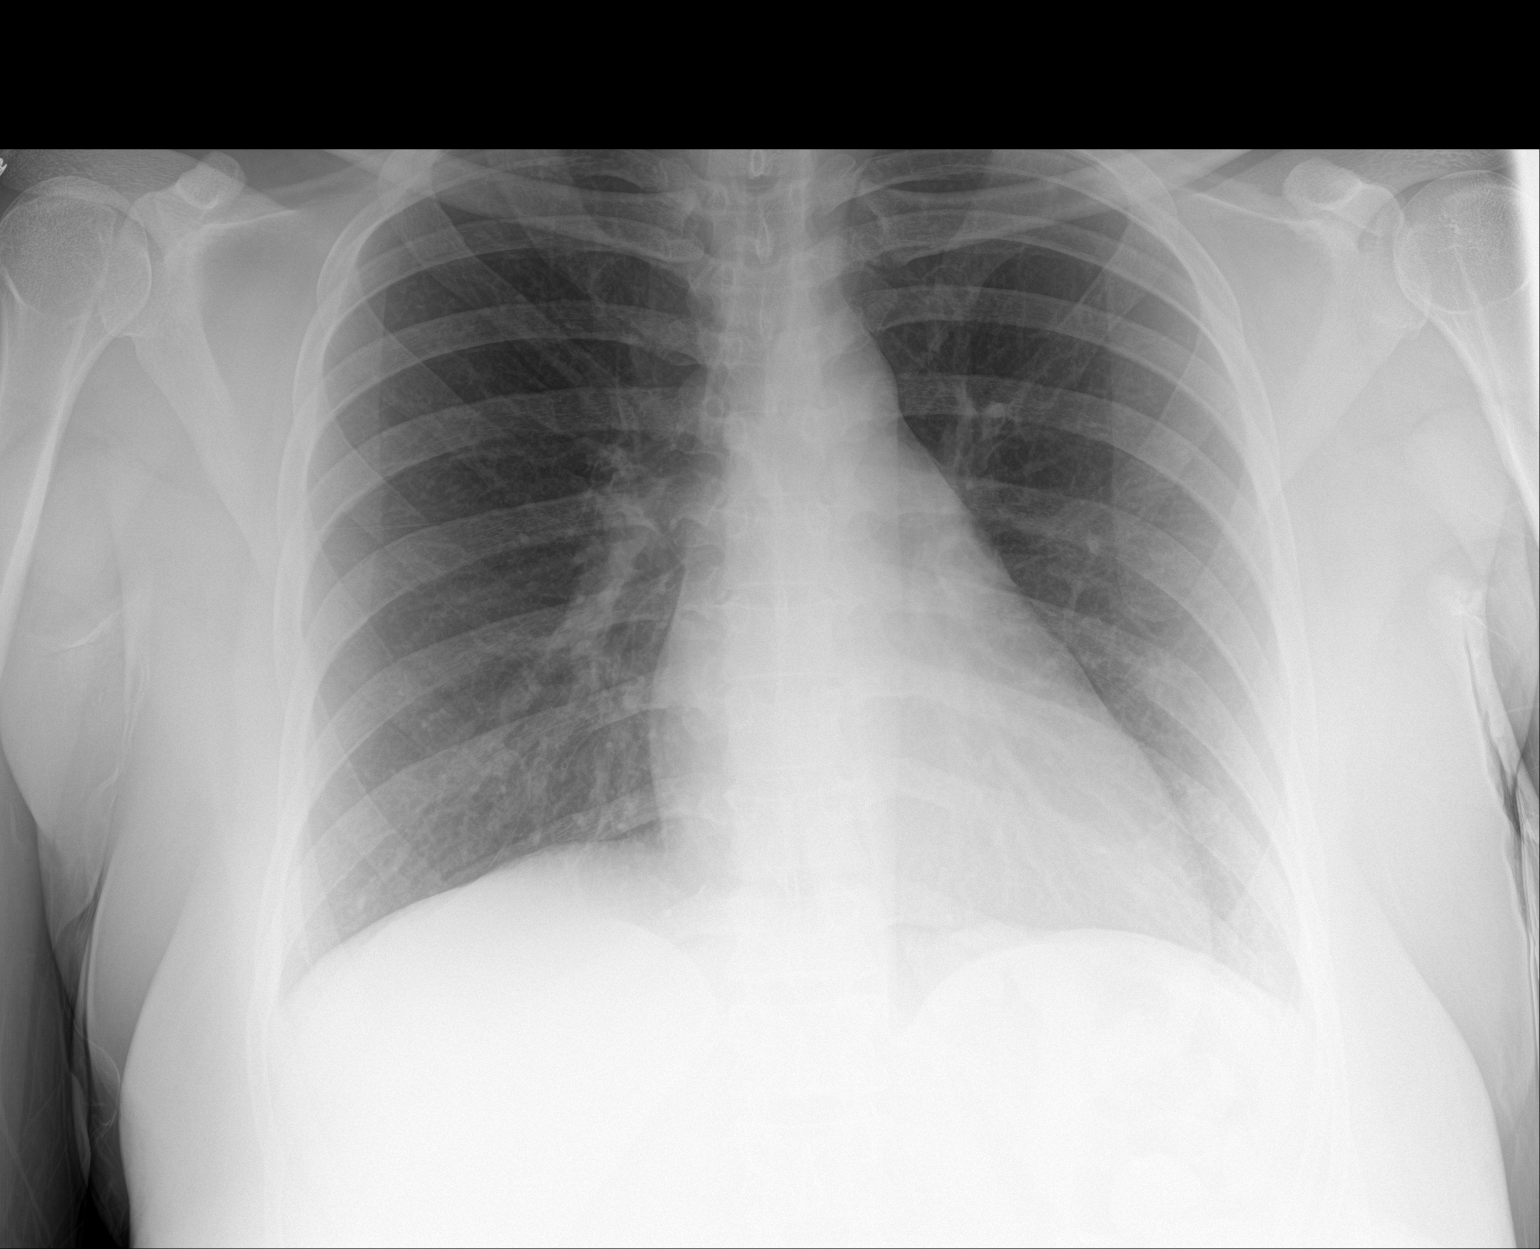

[1 of 1 positions shown; findings below may reference images not displayed]

FINDINGS: The lungs are clear without focal pneumonia, edema, pneumothorax or
pleural effusion. Airspace disease noted previously at the left base
has resolved in the interval. The cardiopericardial silhouette is
within normal limits for size. The visualized bony structures of the
thorax show no acute abnormality.
IMPRESSION: No active disease.

## 2020-01-27 IMAGING — CT CT NECK W/ CM
4 series · 15 of 33 positions shown, 18 images · IV contrast (omnipaque)
Comparison: [DATE]

CLINICAL DATA: Recent COVID positive, sore throat, chills

EXAM:
CT NECK WITH CONTRAST
TECHNIQUE: Multidetector CT imaging of the neck was performed using the
standard protocol following the bolus administration of intravenous
contrast.
CONTRAST:  75mL OMNIPAQUE IOHEXOL 300 MG/ML  SOLN

[Series 3: axial neck · axial · 0.41mm/px · z∈[+1617,+1773]mm · 5 of 118 slices shown, 7 images]
[im 20/118  soft-tissue]
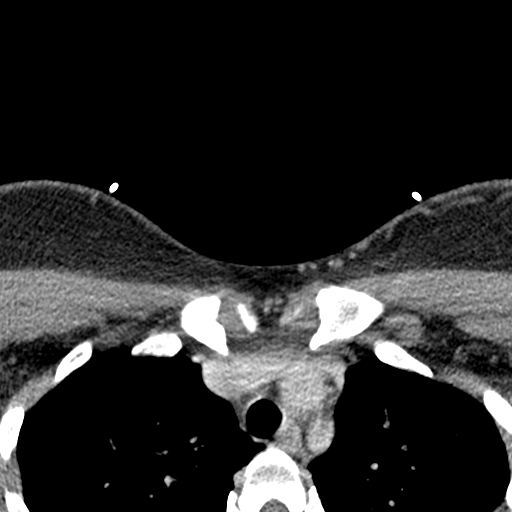
[im 20/118  bone]
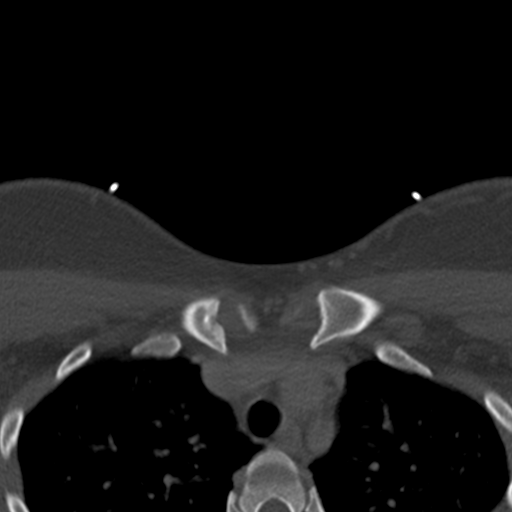
[im 40/118  bone]
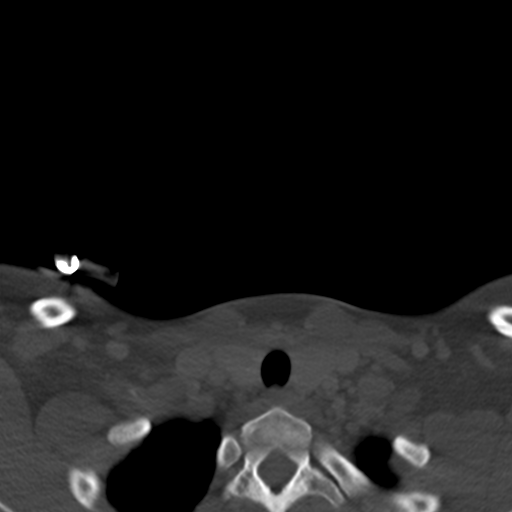
[im 59/118  bone]
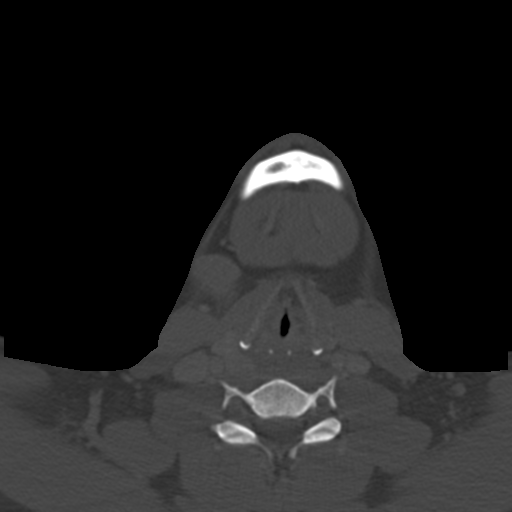
[im 79/118  bone]
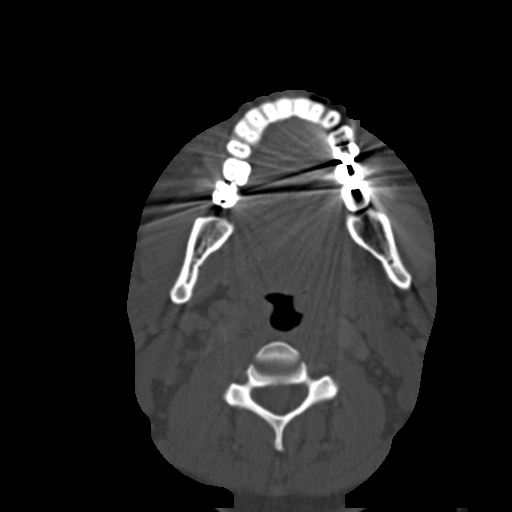
[im 98/118  soft-tissue]
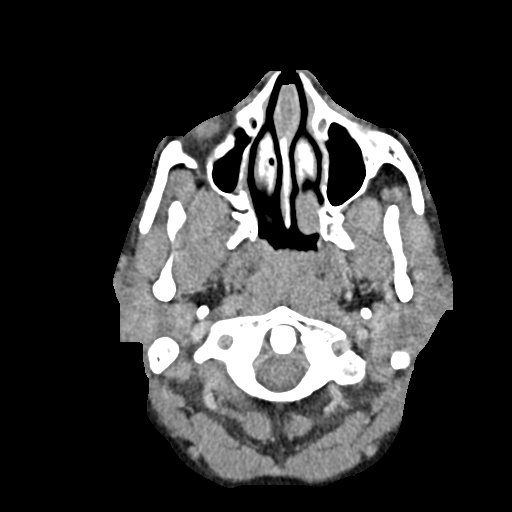
[im 98/118  bone]
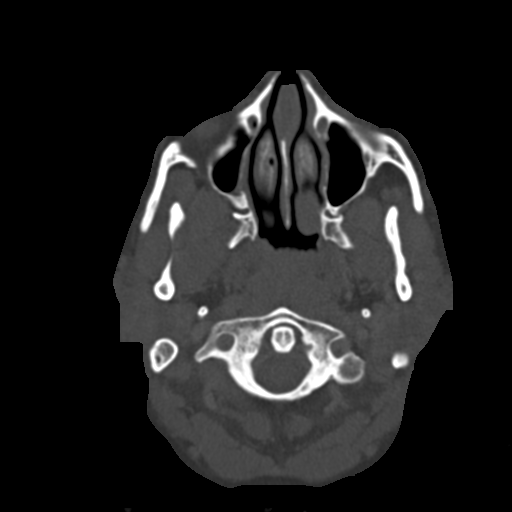

[Series 6: axial · axial · 0.48mm/px · z∈[+1620,+1658]mm · 2 of 117 slices shown]
[im 20/117  bone]
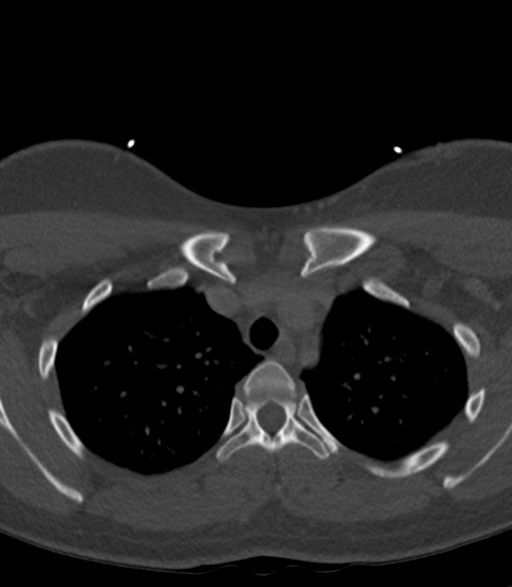
[im 39/117  bone]
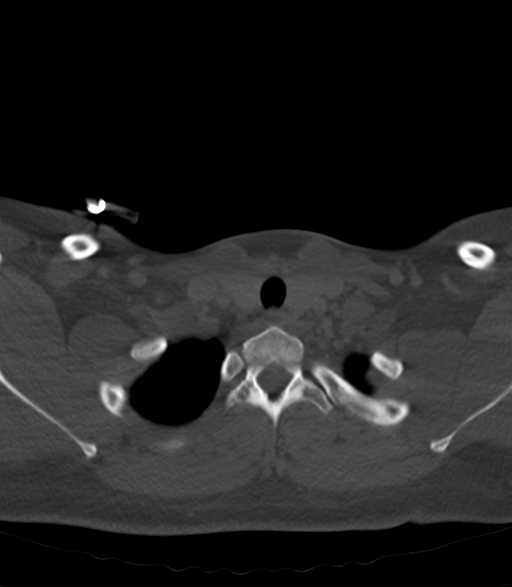

[Series 7: coronal · coronal · 0.46mm/px · 3 of 116 slices shown]
[im 24/116  bone]
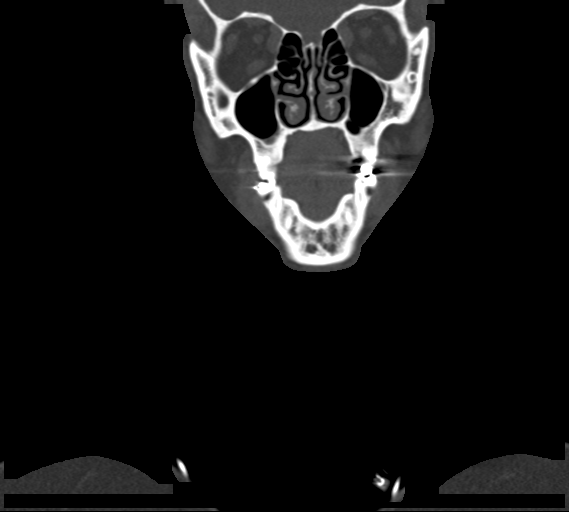
[im 47/116  bone]
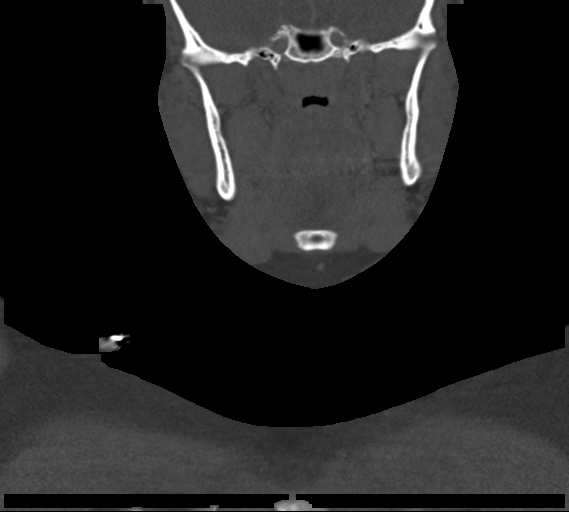
[im 70/116  bone]
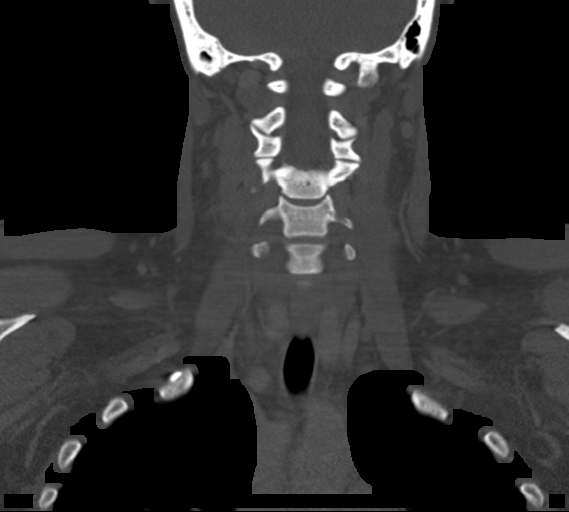

[Series 8: sagittal · sagittal · 0.46mm/px · 5 of 106 slices shown, 6 images]
[im 36/106  bone]
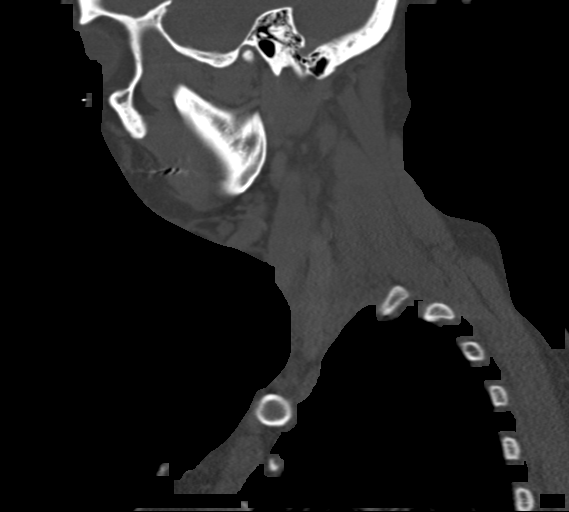
[im 44/106  bone]
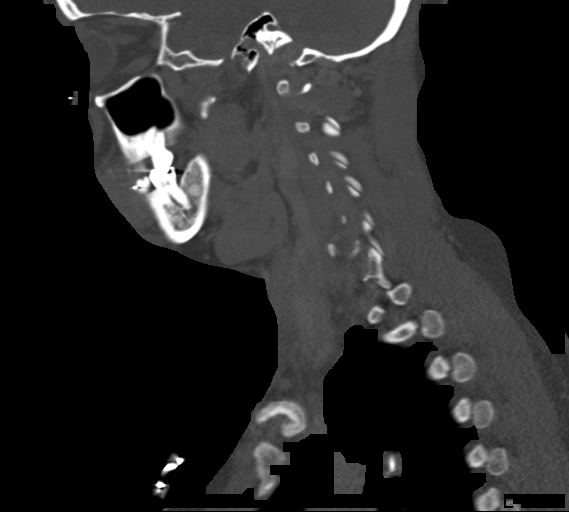
[im 53/106  soft-tissue]
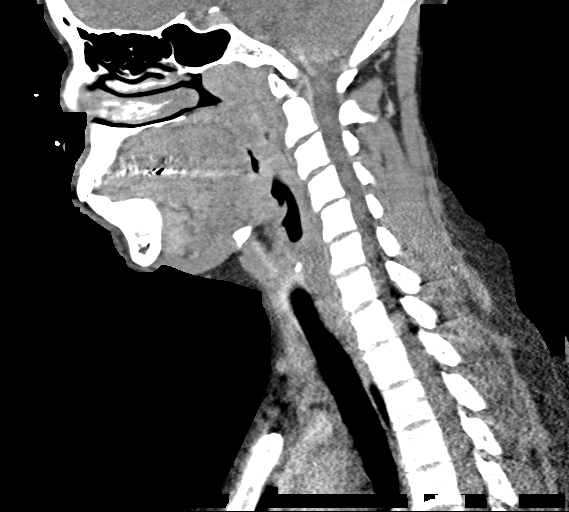
[im 53/106  bone]
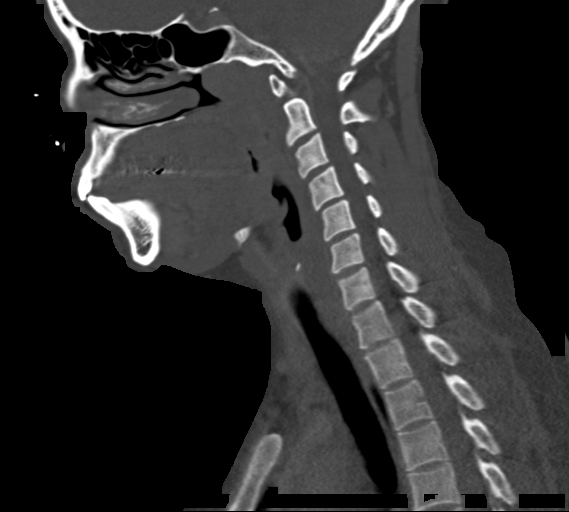
[im 62/106  bone]
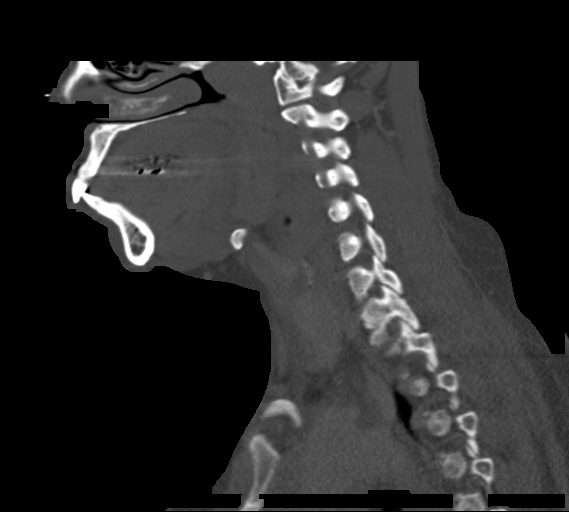
[im 71/106  bone]
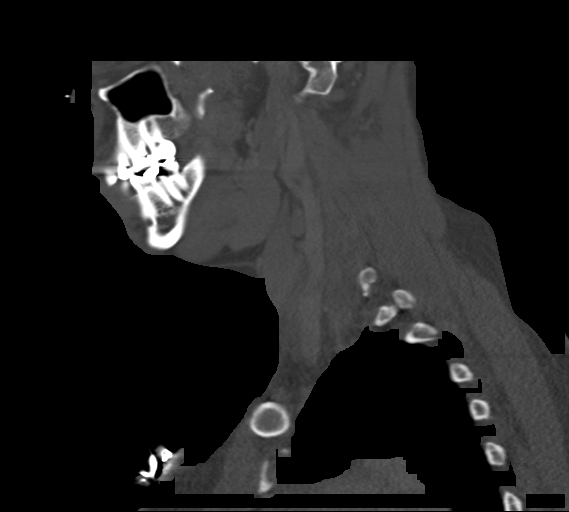

[15 of 33 positions shown; findings below may reference images not displayed]

FINDINGS: Pharynx and larynx: There is similar prominence of the adenoids and
palatine and lingual tonsils. No evidence of peritonsillar abscess.
No significant parapharyngeal inflammatory changes identified. The
epiglottis is unchanged. Airway is patent.

Salivary glands: Unremarkable.

Thyroid: Normal.

Lymph nodes: Decrease in size of lymph nodes compared to the prior
study. For example, a 10 mm (short axis) left level 2 node on the
prior study now measures 8 mm. No new enlarged or abnormal density
nodes.

Vascular: No new abnormality.

Limited intracranial: No abnormal enhancement.

Visualized orbits: Unremarkable.

Mastoids and visualized paranasal sinuses: Aerated.

Skeleton: No significant abnormality.

Upper chest: Included lung apices are clear.

Other: None.
IMPRESSION: Similar prominence of the oropharyngeal and nasopharyngeal lymphoid
tissues. No evidence of peritonsillar abscess. Decrease in size of
lymph nodes with no new adenopathy.

## 2020-01-27 MED ORDER — IOHEXOL 300 MG/ML  SOLN
75.0000 mL | Freq: Once | INTRAMUSCULAR | Status: AC | PRN
  Administered 2020-01-27: 14:00:00 75 mL via INTRAVENOUS

## 2020-01-27 MED ORDER — SODIUM CHLORIDE 0.9 % IV BOLUS
1000.0000 mL | Freq: Once | INTRAVENOUS | Status: AC
Start: 2020-01-27 — End: 2020-01-27
  Administered 2020-01-27: 12:00:00 1000 mL via INTRAVENOUS

## 2020-01-27 MED ORDER — AMOXICILLIN 500 MG PO CAPS: 1000.0000 mg | ORAL_CAPSULE | Freq: Every day | ORAL | 0 refills | Status: AC

## 2020-01-27 MED ORDER — PREDNISONE 10 MG (21) PO TBPK: ORAL_TABLET | Freq: Every day | ORAL | 0 refills | Status: DC

## 2020-01-27 MED ORDER — METOCLOPRAMIDE HCL 5 MG/ML IJ SOLN
10.0000 mg | Freq: Once | INTRAMUSCULAR | Status: AC
  Administered 2020-01-27: 12:00:00 10 mg via INTRAVENOUS
  Filled 2020-01-27: qty 2

## 2020-01-27 NOTE — ED Triage Notes (Signed)
Pt states Dx with Covid in middle Sept., with negative test in November, and states had another positive Covid test Friday. C/o SOB, chills, nausea, general body aches, diarrhea and migraine headache since. Denies vaccinations.

## 2020-01-27 NOTE — Discharge Instructions (Signed)
Take the medications to help with your symptoms. Take Tylenol as needed to help with your fever or body aches related to your Covid infection. Follow-up with your primary care provider. Begin taking the steroids and antibiotics to help with your throat pain. Return to the ER if you start to experience worsening pain, trouble opening your mouth, trouble moving your neck, chest pain or shortness of breath.

## 2020-01-27 NOTE — ED Provider Notes (Addendum)
[facility]-EMERGENCY DEPT Provider Note   CSN: [id number] Arrival date & time: 01/27/20  0957     History Chief Complaint  Patient presents with   Covid Positive   Nausea   Generalized Body Aches   Shortness of Breath    Leah Shea is a 34 y.o. female with a past medical history of asthma, anxiety presenting to the ED for Covid symptoms.  States that she was diagnosed with Covid in September with a subsequent negative test in November.  Last month, patient was admitted to the hospital for acute viral pharyngitis with significant edema, scope was done by ENT which showed inflammation of the nasopharynx but no involvement of the airway, epiglottis or vocal cords improved with antibiotics and steroids.  She presented to the ER on 01/23/2020 for similar sore throat, myalgias.  She tested positive for Covid at the time but negative for strep and mono. She returns today because she has continued sore throat and uvula pain that has worsened since this visit.  Also reports migraine headache, diarrhea, chest tightness, shortness of breath, cough, myalgias.  States that she has difficulty swallowing pills so she tried to crush up some Tylenol and eat it with her applesauce as well as ODT Zofran.  Minimal improvement noted with these medications.  She did undergo a Mab infusion 2 days ago.  She denies any trismus, hemoptysis, leg swelling, significant abdominal pain.  HPI     Past Medical History:  Diagnosis Date   Anemia    Anxiety    Anxiety    Asthma    Blood dyscrasia    sickle trait   Bronchitis, acute    Depression    Diverticular disease    Family history of anesthesia complication    son due sleep apnea   Family history of breast cancer    Family history of prostate cancer    Headache(784.0)    migraines   PTSD (post-traumatic stress disorder)    Shortness of breath    with exertion or panic attack   Sickle cell anemia (HCC)  trait   patient claims asymptomatic   Sleep apnea    awaiting a sleep study to be done at [facility]    Patient Active Problem List   Diagnosis Date Noted   Viral pharyngitis 12/19/2019   Uvular swelling    Genetic testing 02/23/2018   Family history of breast cancer    Family history of prostate cancer    Tonsillar hypertrophy 10/07/2013   G6PD deficiency 08/04/2010    Past Surgical History:  Procedure Laterality Date   BREAST SURGERY Bilateral    reductions   CESAREAN SECTION  2010, 2012,2014   x 3   EYE SURGERY     x 10 as a child   TONSILLECTOMY N/A 10/07/2013   Procedure: TONSILLECTOMY;  Surgeon: Christia Reading, MD;  Location: [facility] OR;  Service: ENT;  Laterality: N/A;   TUMOR REMOVAL     tumor removed from back     OB History   No obstetric history on file.     Family History  Problem Relation Age of Onset   Breast cancer Mother    Breast cancer Maternal Aunt 36       metastatic   Cancer Maternal Uncle        unk type   Breast cancer Maternal Grandmother    Prostate cancer Maternal Grandfather        metastatic   Cancer Maternal Aunt  either breast or ovarian     Social History   Tobacco Use   Smoking status: Former Smoker    Packs/day: 0.25    Years: 1.00    Pack years: 0.25    Types: Cigarettes    Quit date: 07/02/2013    Years since quitting: 6.5   Smokeless tobacco: Never Used  Vaping Use   Vaping Use: Never used  Substance Use Topics   Alcohol use: Not Currently   Drug use: Yes    Types: Marijuana    Home Medications Prior to Admission medications   Medication Sig Start Date End Date Taking? Authorizing Provider  acetaminophen (TYLENOL) 500 MG tablet Take 2 tablets (1,000 mg total) by mouth every 8 (eight) hours as needed for moderate pain or fever. 10/25/19  Yes Darr, Gerilyn Pilgrim, PA-C  busPIRone (BUSPAR) 15 MG tablet Take 15 mg by mouth 2 (two) times daily.   Yes [provider]  FLUoxetine (PROZAC) 10 MG capsule  Take 1 capsule (10 mg total) by mouth daily. 12/24/19 01/23/20 Yes Evlyn Kanner, MD  HYDROcodone-acetaminophen (HYCET) 7.5-325 mg/15 ml solution Place 15 mLs into feeding tube 4 (four) times daily as needed for moderate pain. 01/23/20 01/22/21 Yes Pollyann Savoy, MD  hydrOXYzine (ATARAX/VISTARIL) 25 MG tablet Take 1 tablet (25 mg total) by mouth 2 (two) times daily as needed for anxiety. 12/24/19  Yes Evlyn Kanner, MD  lamoTRIgine (LAMICTAL) 200 MG tablet Take 100-200 mg by mouth See admin instructions.  in the morning  in the evening   Yes [provider]  levETIRAcetam (KEPPRA) 1000 MG tablet Take 1,000 mg by mouth 3 (three) times daily.   Yes [provider]  mirtazapine (REMERON) 30 MG tablet Take 30 mg by mouth at bedtime.   Yes [provider]  amoxicillin (AMOXIL) 500 MG capsule Take 2 capsules (1,000 mg total) by mouth daily for 10 days. 01/27/20 02/06/20  Taneka Espiritu, PA-C  clonazePAM (KLONOPIN) 0.25 MG disintegrating tablet Take 1 tablet (0.25 mg total) by mouth 2 (two) times daily as needed for up to 5 days (anxiety). Patient not taking: Reported on 01/27/2020 12/24/19 12/29/19  Evlyn Kanner, MD  feeding supplement (ENSURE ENLIVE / ENSURE PLUS) LIQD Take 237 mLs by mouth 2 (two) times daily between meals. 12/24/19   Evlyn Kanner, MD  predniSONE (STERAPRED UNI-PAK 21 TAB) 10 MG (21) TBPK tablet Take by mouth daily. Take 6 tabs by mouth daily  for 2 days, then 5 tabs for 2 days, then 4 tabs for 2 days, then 3 tabs for 2 days, 2 tabs for 2 days, then 1 tab by mouth daily for 2 days 01/27/20   Dietrich Pates, PA-C    Allergies    Other, Nsaids, and Sulfa antibiotics  Review of Systems   Review of Systems  Constitutional: Positive for appetite change and chills. Negative for fever.  HENT: Positive for sore throat. Negative for ear pain, rhinorrhea and sneezing.   Eyes: Negative for photophobia and visual disturbance.  Respiratory:  Positive for cough, chest tightness and shortness of breath. Negative for wheezing.   Cardiovascular: Negative for chest pain and palpitations.  Gastrointestinal: Positive for diarrhea, nausea and vomiting. Negative for abdominal pain, blood in stool and constipation.  Genitourinary: Negative for dysuria, hematuria and urgency.  Musculoskeletal: Negative for myalgias.  Skin: Negative for rash.  Neurological: Positive for headaches. Negative for dizziness, weakness and light-headedness.    Physical Exam Updated Vital Signs BP 131/90    Pulse 87  Temp 98.2 F (36.8 C) (Oral)    Resp 15    LMP 01/09/2020    SpO2 94%   Physical Exam Vitals and nursing note reviewed.  Constitutional:      General: She is not in acute distress.    Appearance: She is well-developed and well-nourished.     Comments: Tearful, anxious.  HENT:     Head: Normocephalic and atraumatic.     Nose: Nose normal.     Mouth/Throat:     Pharynx: Posterior oropharyngeal erythema and uvula swelling present.     Tonsils: No tonsillar abscesses. 1+ on the right. 1+ on the left.     Comments: Diffuse erythema and edema including the uvula.  No asymmetrical edema.  No exudates.  Hoarse voice noted but patient tolerating secretions, no stridor, no signs of respiratory distress, no trismus. Eyes:     General: No scleral icterus.       Right eye: No discharge.        Left eye: No discharge.     Extraocular Movements: EOM normal.     Conjunctiva/sclera: Conjunctivae normal.  Neck:     Comments: No stiffness noted. Cardiovascular:     Rate and Rhythm: Normal rate and regular rhythm.     Pulses: Intact distal pulses.     Heart sounds: Normal heart sounds. No murmur heard. No friction rub. No gallop.   Pulmonary:     Effort: Pulmonary effort is normal. No respiratory distress.     Breath sounds: Normal breath sounds.  Abdominal:     General: Bowel sounds are normal. There is no distension.     Palpations: Abdomen is  soft.     Tenderness: There is no abdominal tenderness. There is no guarding.  Musculoskeletal:        General: No edema. Normal range of motion.     Cervical back: Normal range of motion and neck supple.  Skin:    General: Skin is warm and dry.     Findings: No rash.  Neurological:     Mental Status: She is alert and oriented to person, place, and time.     Cranial Nerves: No cranial nerve deficit.     Sensory: No sensory deficit.     Motor: No abnormal muscle tone.     Coordination: Coordination normal.  Psychiatric:        Mood and Affect: Mood and affect normal.     ED Results / Procedures / Treatments   Labs (all labs ordered are listed, but only abnormal results are displayed) Labs Reviewed  COMPREHENSIVE METABOLIC PANEL - Abnormal; Notable for the following components:      Result Value   Glucose, Bld 106 (*)    AST 12 (*)    Total Bilirubin 0.2 (*)    All other components within normal limits  CBC WITH DIFFERENTIAL/PLATELET    EKG None  Radiology CT Soft Tissue Neck W Contrast  Result Date: 01/27/2020 CLINICAL DATA:  Recent COVID positive, sore throat, chills EXAM: CT NECK WITH CONTRAST TECHNIQUE: Multidetector CT imaging of the neck was performed using the standard protocol following the bolus administration of intravenous contrast. CONTRAST:  75mL OMNIPAQUE IOHEXOL 300 MG/ML  SOLN COMPARISON:  12/20/2019 FINDINGS: Pharynx and larynx: There is similar prominence of the adenoids and palatine and lingual tonsils. No evidence of peritonsillar abscess. No significant parapharyngeal inflammatory changes identified. The epiglottis is unchanged. Airway is patent. Salivary glands: Unremarkable. Thyroid: Normal. Lymph nodes: Decrease in size of  lymph nodes compared to the prior study. For example, a 10 mm (short axis) left level 2 node on the prior study now measures 8 mm. No new enlarged or abnormal density nodes. Vascular: No new abnormality. Limited intracranial: No abnormal  enhancement. Visualized orbits: Unremarkable. Mastoids and visualized paranasal sinuses: Aerated. Skeleton: No significant abnormality. Upper chest: Included lung apices are clear. Other: None. IMPRESSION: Similar prominence of the oropharyngeal and nasopharyngeal lymphoid tissues. No evidence of peritonsillar abscess. Decrease in size of lymph nodes with no new adenopathy. Electronically Signed   By: Guadlupe Spanish M.D.   On: 01/27/2020 14:36   DG Chest Portable 1 View  Result Date: 01/27/2020 CLINICAL DATA:  Shortness of breath.  History of COVID infection. EXAM: PORTABLE CHEST 1 VIEW COMPARISON:  12/22/2019 FINDINGS: The lungs are clear without focal pneumonia, edema, pneumothorax or pleural effusion. Airspace disease noted previously at the left base has resolved in the interval. The cardiopericardial silhouette is within normal limits for size. The visualized bony structures of the thorax show no acute abnormality. IMPRESSION: No active disease. Electronically Signed   By: Kennith Center M.D.   On: 01/27/2020 11:30    Procedures Procedures (including critical care time)  Medications Ordered in ED Medications  sodium chloride 0.9 % bolus 1,000 mL (0 mLs Intravenous Stopped 01/27/20 1406)  metoCLOPramide (REGLAN) injection 10 mg (10 mg Intravenous Given 01/27/20 1156)  iohexol (OMNIPAQUE) 300 MG/ML solution 75 mL (75 mLs Intravenous Contrast Given 01/27/20 1417)    ED Course  I have reviewed the triage vital signs and the nursing notes.  Pertinent labs & imaging results that were available during my care of the patient were reviewed by me and considered in my medical decision making (see chart for details).    MDM Rules/Calculators/A&P                          34 year old female with a past medical history of asthma, anxiety presenting to the ED for Covid symptoms.  Diagnosed with Covid in September with a subsequent negative test in November.  She tested positive again on her visit on  01/23/2020.  She was admitted last month for acute pharyngitis with significant edema.  A scope was done by ENT which showed inflammation but no involvement of the airway, epiglottis or vocal cords.  Her concern today is similar pain that began at her prior visit.  She had a negative strep and mono test at the time.  She was discharged with steroids which she has not taken.  Regarding her Covid symptoms she reports migraine, diarrhea, chest tightness, shortness of breath, cough and myalgias.  She does have significant erythema of the posterior oropharynx with some uvula swelling.  She has no trismus, drooling or respiratory distress.  Normal range of motion of the neck.  No neck swelling or edema noted.  She did undergo a Mab infusion 2 days ago.  Lungs are clear to auscultation bilaterally.  Oxygen saturations remained above 93% on room air.  Chest x-ray without any acute findings.  EKG showing normal sinus rhythm. CMP, CBC unremarkable.  Due to patient's symptoms today as well as her history, CT of the neck was done which shows similar appearance of the lymphoid tissues without airway involvement or evidence of peritonsillar abscess.  She does have a decrease in the size of lymph nodes without any new adenopathy.  I suspect this may be recurrent viral pharyngitis.  Because she did improve  with antibiotics in the past we will treat with steroids and antibiotic course.  We will also treat symptomatically for her Covid infection.  She continues to have no respiratory distress or signs of airway compromise.  She will benefit from follow-up with ENT, as well as post Covid care clinic which I provided for her.  Return precautions given.   Patient is hemodynamically stable, in NAD, and able to ambulate in the ED. Evaluation does not show pathology that would require ongoing emergent intervention or inpatient treatment. I explained the diagnosis to the patient. Pain has been managed and has no complaints prior to  discharge. Patient is comfortable with above plan and is stable for discharge at this time. All questions were answered prior to disposition. Strict return precautions for returning to the ED were discussed. Encouraged follow up with PCP.   An After Visit Summary was printed and given to the patient.   Portions of this note were generated with Scientist, clinical (histocompatibility and immunogenetics). Dictation errors may occur despite best attempts at proofreading.  Final Clinical Impression(s) / ED Diagnoses Final diagnoses:  COVID-19 virus infection  Pharyngitis, unspecified etiology    Rx / DC Orders ED Discharge Orders         Ordered    predniSONE (STERAPRED UNI-PAK 21 TAB) 10 MG (21) TBPK tablet  Daily        01/27/20 1456    amoxicillin (AMOXIL) 500 MG capsule  Daily        01/27/20 1456            Dietrich Pates, PA-C 01/27/20 1519    Alvira Monday, MD 01/29/20 1121

## 2020-02-11 ENCOUNTER — Telehealth: Payer: Self-pay | Admitting: Nurse Practitioner

## 2020-02-11 NOTE — Telephone Encounter (Signed)
Called pt [phone] LVM to call [facility] [phone] to schedule HFU appt at clinic or PCP.

## 2020-03-27 ENCOUNTER — Other Ambulatory Visit: Payer: Self-pay

## 2020-03-27 ENCOUNTER — Inpatient Hospital Stay (HOSPITAL_COMMUNITY)
Admission: EM | Admit: 2020-03-27 | Discharge: 2020-03-27 | Disposition: A | Discharge status: Home / Self Care | Payer: No Typology Code available for payment source | Attending: Advanced Practice Midwife | Admitting: Advanced Practice Midwife

## 2020-03-27 ENCOUNTER — Encounter (HOSPITAL_COMMUNITY): Payer: Self-pay | Admitting: Family Medicine

## 2020-03-27 DIAGNOSIS — R112 Nausea with vomiting, unspecified: Secondary | ICD-10-CM | POA: Diagnosis not present

## 2020-03-27 DIAGNOSIS — O2691 Pregnancy related conditions, unspecified, first trimester: Secondary | ICD-10-CM | POA: Insufficient documentation

## 2020-03-27 DIAGNOSIS — Z3A08 8 weeks gestation of pregnancy: Secondary | ICD-10-CM | POA: Diagnosis not present

## 2020-03-27 DIAGNOSIS — O219 Vomiting of pregnancy, unspecified: Secondary | ICD-10-CM | POA: Diagnosis not present

## 2020-03-27 DIAGNOSIS — Z87891 Personal history of nicotine dependence: Secondary | ICD-10-CM | POA: Insufficient documentation

## 2020-03-27 LAB — COMPREHENSIVE METABOLIC PANEL
ALT: 19 U/L (ref 0–44)
AST: 15 U/L (ref 15–41)
Albumin: 3.7 g/dL (ref 3.5–5.0)
Alkaline Phosphatase: 51 U/L (ref 38–126)
Anion gap: 11 (ref 5–15)
BUN: 7 mg/dL (ref 6–20)
CO2: 20 mmol/L — ABNORMAL LOW (ref 22–32)
Calcium: 9.4 mg/dL (ref 8.9–10.3)
Chloride: 106 mmol/L (ref 98–111)
Creatinine, Ser: 0.7 mg/dL (ref 0.44–1.00)
GFR, Estimated: >60 mL/min (ref >60)
Glucose, Bld: 103 mg/dL — ABNORMAL HIGH (ref 70–99)
Potassium: 3.3 mmol/L — ABNORMAL LOW (ref 3.5–5.1)
Sodium: 137 mmol/L (ref 135–145)
Total Bilirubin: 0.7 mg/dL (ref 0.3–1.2)
Total Protein: 6.9 g/dL (ref 6.5–8.1)

## 2020-03-27 LAB — URINALYSIS, ROUTINE W REFLEX MICROSCOPIC
Bilirubin Urine: NEGATIVE
Glucose, UA: NEGATIVE mg/dL
Hgb urine dipstick: NEGATIVE
Ketones, ur: 20 mg/dL — AB
Leukocytes,Ua: NEGATIVE
Nitrite: NEGATIVE
Protein, ur: NEGATIVE mg/dL
Specific Gravity, Urine: 1.016 (ref 1.005–1.030)
pH: 5 (ref 5.0–8.0)

## 2020-03-27 LAB — HCG, QUANTITATIVE, PREGNANCY: hCG, Beta Chain, Quant, S: 135895 m[IU]/mL — ABNORMAL HIGH (ref <5)

## 2020-03-27 LAB — CBC
HCT: 34.4 % — ABNORMAL LOW (ref 36.0–46.0)
Hemoglobin: 12.5 g/dL (ref 12.0–15.0)
MCH: 31.6 pg (ref 26.0–34.0)
MCHC: 36.3 g/dL — ABNORMAL HIGH (ref 30.0–36.0)
MCV: 87.1 fL (ref 80.0–100.0)
Platelets: 228 10*3/uL (ref 150–400)
RBC: 3.95 MIL/uL (ref 3.87–5.11)
RDW: 11.9 % (ref 11.5–15.5)
WBC: 7.7 10*3/uL (ref 4.0–10.5)
nRBC: 0 % (ref 0.0–0.2)

## 2020-03-27 LAB — I-STAT BETA HCG BLOOD, ED (NOT ORDERABLE): I-stat hCG, quantitative: >2000 m[IU]/mL — ABNORMAL HIGH (ref <5)

## 2020-03-27 LAB — ABO/RH: ABO/RH(D): A POS

## 2020-03-27 MED ORDER — LACTATED RINGERS IV BOLUS
1000.0000 mL | Freq: Once | INTRAVENOUS | Status: AC
  Administered 2020-03-27: 1000 mL via INTRAVENOUS

## 2020-03-27 MED ORDER — ONDANSETRON 4 MG PO TBDP: 4.0000 mg | ORAL_TABLET | Freq: Four times a day (QID) | ORAL | 0 refills | Status: DC | PRN

## 2020-03-27 MED ORDER — PROMETHAZINE HCL 25 MG/ML IJ SOLN
25.0000 mg | Freq: Once | INTRAMUSCULAR | Status: AC
  Administered 2020-03-27: 25 mg via INTRAVENOUS
  Filled 2020-03-27: qty 1

## 2020-03-27 MED ORDER — FAMOTIDINE IN NACL 20-0.9 MG/50ML-% IV SOLN
20.0000 mg | Freq: Once | INTRAVENOUS | Status: AC
  Administered 2020-03-27: 20 mg via INTRAVENOUS
  Filled 2020-03-27: qty 50

## 2020-03-27 MED ORDER — SCOPOLAMINE 1 MG/3DAYS TD PT72
1.0000 | MEDICATED_PATCH | TRANSDERMAL | Status: DC
  Administered 2020-03-27: 1.5 mg via TRANSDERMAL
  Filled 2020-03-27: qty 1

## 2020-03-27 MED ORDER — SCOPOLAMINE 1 MG/3DAYS TD PT72: 1.0000 | MEDICATED_PATCH | TRANSDERMAL | 12 refills | Status: DC

## 2020-03-27 MED ORDER — ONDANSETRON HCL 4 MG/2ML IJ SOLN
4.0000 mg | Freq: Once | INTRAMUSCULAR | Status: AC
  Administered 2020-03-27: 4 mg via INTRAVENOUS
  Filled 2020-03-27: qty 2

## 2020-03-27 NOTE — MAU Note (Signed)
Leah Shea is a 35 y.o. at [redacted]w[redacted]d here in MAU reporting: for the past 2 days she has been having increased nausea and vomiting. Has tried zofran and phenergan, last was last night. Emesis > 10 in the past 24 hours. Denies bleeding and discharge. Having some cramping.  Onset of complaint: ongoing  Pain score: 6/10  Vitals:   03/27/20 1027 03/27/20 1132  BP: (!) 131/98 127/84  Pulse: 93 (!) 101  Resp: 18 16  Temp: 98.3 F (36.8 C) 98.6 F (37 C)  SpO2: 98% 99%     Lab orders placed from triage: UA

## 2020-03-27 NOTE — MAU Provider Note (Addendum)
History     CSN: [id number]  Arrival date and time: 03/27/20 1024   Event Date/Time   First Provider Initiated Contact with Patient 03/27/20 1204      Chief Complaint  Patient presents with  . Nausea  . Emesis  . Abdominal Pain   35 year old G72P4, [redacted]w[redacted]d gestation female presented to MAU for evaluation of intractable nausea and vomiting. Patient reports a 2 day history of excessive vomiting and lower abdominal cramping that radiates to the right back. She has been unable to keep food or water down and noticed blood streaks in her emesis this morning. She reports a 10 pound weight loss and dizziness. She endorsed taking Zofran and Phenergan with no relief. She reported a similar episode in 2006 when she was pregnant with her daughter, but describes this as worse.   She denied fevers, chills, vaginal bleeding, shortness of breath, chest pain, and dysuria.    OB History    Gravida  7   Para  4   Term  2   Preterm  2   AB  2   Living  4     SAB  2   IAB      Ectopic      Multiple      Live Births  4           Past Medical History:  Diagnosis Date  . Anemia   . Anxiety   . Anxiety   . Asthma   . Blood dyscrasia    sickle trait  . Bronchitis, acute   . Depression   . Diverticular disease   . Family history of anesthesia complication    son due sleep apnea  . Family history of breast cancer   . Family history of prostate cancer   . Headache(784.0)    migraines  . PTSD (post-traumatic stress disorder)   . Shortness of breath    with exertion or panic attack  . Sickle cell anemia (HCC) trait   patient claims asymptomatic  . Sleep apnea    awaiting a sleep study to be done at [facility]    Past Surgical History:  Procedure Laterality Date  . BREAST SURGERY Bilateral    reductions  . CESAREAN SECTION  2010, [id number]   x 3  . EYE SURGERY     x 10 as a child  . TONSILLECTOMY N/A 10/07/2013   Procedure: TONSILLECTOMY;  Surgeon: Christia Reading, MD;   Location: [facility] OR;  Service: ENT;  Laterality: N/A;  . TUMOR REMOVAL     tumor removed from back    Family History  Problem Relation Age of Onset  . Breast cancer Mother   . Breast cancer Maternal Aunt 36       metastatic  . Cancer Maternal Uncle        unk type  . Breast cancer Maternal Grandmother   . Prostate cancer Maternal Grandfather        metastatic  . Cancer Maternal Aunt        either breast or ovarian     Social History   Tobacco Use  . Smoking status: Former Smoker    Packs/day: 0.25    Years: 1.00    Pack years: 0.25    Types: Cigarettes    Quit date: 07/02/2013    Years since quitting: 6.7  . Smokeless tobacco: Never Used  Vaping Use  . Vaping Use: Never used  Substance Use Topics  . Alcohol use: Not Currently  .  Drug use: Not Currently    Types: Marijuana    Allergies:  Allergies  Allergen Reactions  . Other Anaphylaxis and Hives    Mushrooms  . Nsaids Hives, Swelling and Other (See Comments)    Body burns   . Reglan [Metoclopramide] Other (See Comments)    anxiety  . Sulfa Antibiotics Other (See Comments)    burns    No medications prior to admission.    Review of Systems  Constitutional: Negative for chills and fever.  Respiratory: Negative for chest tightness and shortness of breath.   Cardiovascular: Negative for chest pain, palpitations and leg swelling.  Gastrointestinal: Positive for abdominal pain, nausea and vomiting. Negative for diarrhea.  Genitourinary: Negative for difficulty urinating, dysuria, vaginal bleeding and vaginal discharge.  Neurological: Positive for light-headedness.   Physical Exam   Blood pressure 127/84, pulse (!) 101, temperature 98.6 F (37 C), temperature source Oral, resp. rate 16, height 5' 6.5" (1.689 m), weight 88.9 kg, last menstrual period 01/29/2020, SpO2 99 %.  Physical Exam Constitutional:      Appearance: She is well-developed.  Cardiovascular:     Rate and Rhythm: Normal rate and regular  rhythm.     Heart sounds: Normal heart sounds. No murmur heard. No friction rub.  Pulmonary:     Effort: Pulmonary effort is normal. No respiratory distress.     Breath sounds: Normal breath sounds.  Abdominal:     Palpations: Abdomen is soft.     Tenderness: There is no abdominal tenderness.  Skin:    General: Skin is warm and dry.  Neurological:     General: No focal deficit present.     Mental Status: She is alert and oriented to person, place, and time.  Psychiatric:        Mood and Affect: Mood normal.        Behavior: Behavior normal.    Results for orders placed or performed during the hospital encounter of 03/27/20 (from the past 24 hour(s))  I-Stat beta hCG blood, ED     Status: Abnormal   Collection Time: 03/27/20 10:51 AM  Result Value Ref Range   I-stat hCG, quantitative >2,000.0 (H) <5 mIU/mL   Comment 3          Urinalysis, Routine w reflex microscopic Urine, Clean Catch     Status: Abnormal   Collection Time: 03/27/20 11:21 AM  Result Value Ref Range   Color, Urine YELLOW YELLOW   APPearance HAZY (A) CLEAR   Specific Gravity, Urine 1.016 1.005 - 1.030   pH 5.0 5.0 - 8.0   Glucose, UA NEGATIVE NEGATIVE mg/dL   Hgb urine dipstick NEGATIVE NEGATIVE   Bilirubin Urine NEGATIVE NEGATIVE   Ketones, ur 20 (A) NEGATIVE mg/dL   Protein, ur NEGATIVE NEGATIVE mg/dL   Nitrite NEGATIVE NEGATIVE   Leukocytes,Ua NEGATIVE NEGATIVE  CBC     Status: Abnormal   Collection Time: 03/27/20 12:37 PM  Result Value Ref Range   WBC 7.7 4.0 - 10.5 K/uL   RBC 3.95 3.87 - 5.11 MIL/uL   Hemoglobin 12.5 12.0 - 15.0 g/dL   HCT 44.9 (L) 20.1 - 00.7 %   MCV 87.1 80.0 - 100.0 fL   MCH 31.6 26.0 - 34.0 pg   MCHC 36.3 (H) 30.0 - 36.0 g/dL   RDW 12.1 97.5 - 88.3 %   Platelets 228 150 - 400 K/uL   nRBC 0.0 0.0 - 0.2 %  Comprehensive metabolic panel     Status: Abnormal  Collection Time: 03/27/20 12:37 PM  Result Value Ref Range   Sodium 137 135 - 145 mmol/L   Potassium 3.3 (L) 3.5 -  5.1 mmol/L   Chloride 106 98 - 111 mmol/L   CO2 20 (L) 22 - 32 mmol/L   Glucose, Bld 103 (H) 70 - 99 mg/dL   BUN 7 6 - 20 mg/dL   Creatinine, Ser 0.45 0.44 - 1.00 mg/dL   Calcium 9.4 8.9 - 99.7 mg/dL   Total Protein 6.9 6.5 - 8.1 g/dL   Albumin 3.7 3.5 - 5.0 g/dL   AST 15 15 - 41 U/L   ALT 19 0 - 44 U/L   Alkaline Phosphatase 51 38 - 126 U/L   Total Bilirubin 0.7 0.3 - 1.2 mg/dL   GFR, Estimated >74 >14 mL/min   Anion gap 11 5 - 15  hCG, quantitative, pregnancy     Status: Abnormal   Collection Time: 03/27/20 12:37 PM  Result Value Ref Range   hCG, Beta Chain, Quant, S 135,895 (H) <5 mIU/mL  ABO/Rh     Status: None   Collection Time: 03/27/20 12:37 PM  Result Value Ref Range   ABO/RH(D)      A POS Performed at [facility], [address], [place], [place] [address]      MAU Course  Procedures  MDM - u/a, CMP, CBC, beta HCG ordered and reviewed  - Beta HCG: 135,895- confirming pregnancy  - u/a: ketonuria- likely due to inability to keep food down.  - CMP: hypokalemia (3.3)- likely due to hyperemesis   - Patient received 1000 mL of LR, 20 mg Pepcid, 4 mg Zofran, 25 mg Phenergan and scopolamine patch during course of visit with symptomatic relief.   Assessment and Plan  Assessment:  1. Intractable vomiting with nausea, unspecified vomiting type    Plan:  - Discharge to home  - Prescribed Zofran prn and Scopolamine patch for nausea and vomiting. - Patient encouraged to return to MAU if continued inability to tolerate food and water.   Raejonna L Pascarella 03/27/2020, 3:53 PM   Attestation of Supervision of Student:  I confirm that I have verified the information documented in the physician assistant student's note and that I have also personally reperformed the history, physical exam and all medical decision making activities.  I have verified that all services and findings are accurately documented in this student's note; and I agree with management and plan as  outlined in the documentation. I have also made any necessary editorial changes.  --Patient has transferred care from [place] to [facility] --Phenergan prescribed by outside provider --Mild ketonuria today --Encouraged patient to take all medications when available --D/C prenatal vitamin until at least 16 hours between episodes of vomiting --Endorses feeling much better, tolerating PO prior to discharge --Discharge home in stable condition  Calvert Cantor, CNM [facility] [employer], [facility] 03/27/2020 4:57 PM

## 2020-03-27 NOTE — Discharge Instructions (Signed)
What are the causes? The cause of this condition is not known. It may be associated with:  Changes in hormones in the body during pregnancy.  Changes in the gastrointestinal system.  Genetic or inherited conditions. What are the signs or symptoms? Symptoms of this condition include:  Severe nausea and vomiting that does not go away.  Problems keeping food down.  Weight loss.  Loss of body fluid (dehydration).  Loss of appetite. You may have no desire to eat or you may not like the food you have previously enjoyed. How is this diagnosed? This condition may be diagnosed based on your medical history, your symptoms, and a physical exam. You may also have other tests, including:  Blood tests.  Urine tests.  Blood pressure tests.  Ultrasound to look for problems with the placenta or to check if you are pregnant with more than one baby. How is this treated? This condition is managed by controlling symptoms. This may include:  Following an eating plan. This can help to lessen nausea and vomiting.  Treatments that do not use medicine. These include acupressure bracelets, hypnosis, and eating or drinking foods or fluids that contain ginger, ginger ale, or ginger tea.  Taking prescription medicine or over-the-counter medicine as told by your health care provider.  Continuing to take prenatal vitamins. You may need to change what kind you take and when you take them. Follow your health care provider's instructions about prenatal vitamins. An eating plan and medicines are often used together to help control symptoms. If medicines do not help relieve nausea and vomiting, you may need to receive fluids through an IV at the hospital. Follow these instructions at home: To help relieve your symptoms, listen to your body. Everyone is different and has different preferences. Find what works best for you. Here are some things you can try to help relieve your symptoms: Meals and snacks  Eat  5-6 small meals daily instead of 3 large meals. Eating small meals and snacks can help you avoid an empty stomach.  Before getting out of bed, eat a couple of crackers to avoid moving around on an empty stomach.  Eat a protein-rich snack before bed. Examples include cheese and crackers, or a peanut butter sandwich made with 1 slice of whole-wheat bread and 1 tsp (5 g) of peanut butter.  Eat and drink slowly.  Try eating starchy foods as these are usually tolerated well. Examples include cereal, toast, bread, potatoes, pasta, rice, and pretzels.  Eat at least one serving of protein with your meals and snacks. Protein options include lean meats, poultry, seafood, beans, nuts, nut butters, eggs, cheese, and yogurt.  Eat or suck on things that have ginger in them. It may help to relieve nausea. Add  tsp (0.44 g) ground ginger to hot tea, or choose ginger tea.   Fluids It is important to stay hydrated. Try to:  Drink small amounts of fluids often.  Drink fluids 30 minutes before or after a meal to help lessen the feeling of a full stomach.  Drink 100% fruit juice or an electrolyte drink. An electrolyte drink contains sodium, potassium, and chloride.  Drink fluids that are cold, clear, and carbonated or sour. These include lemonade, ginger ale, lemon-lime soda, ice water, and sparkling water. Things to avoid Avoid the following:  Eating foods that trigger your symptoms. These may include spicy foods, coffee, high-fat foods, very sweet foods, and acidic foods.  Drinking more than 1 cup of fluid at a time.  Skipping meals. Nausea can be more intense on an empty stomach. If you cannot tolerate food, do not force it. Try sucking on ice chips or other frozen items and make up for missed calories later.  Lying down within 2 hours after eating.  Being exposed to environmental triggers. These may include food smells, smoky rooms, closed spaces, rooms with strong smells, warm or humid places,  overly loud and noisy rooms, and rooms with motion or flickering lights. Try eating meals in a well-ventilated area that is free of strong smells.  Making quick and sudden changes in your movement.  Taking iron pills and multivitamins that contain iron. If you take prescription iron pills, do not stop taking them unless your health care provider approves.  Preparing food. The smell of food can spoil your appetite or trigger nausea. General instructions  Brush your teeth or use a mouth rinse after meals.  Take over-the-counter and prescription medicines only as told by your health care provider.  Follow instructions from your health care provider about eating or drinking restrictions.  Talk with your health care provider about starting a supplement of vitamin B6.  Continue to take your prenatal vitamins as told by your health care provider. If you are having trouble taking your prenatal vitamins, talk with your health care provider about other options.  Keep all follow-up visits. This is important. Follow-up visits include prenatal visits. Contact a health care provider if:  You have pain in your abdomen.  You have a severe headache.  You have vision problems.  You are losing weight.  You feel weak or dizzy.  You cannot eat or drink without vomiting, especially if this goes on for a full day. Get help right away if:  You cannot drink fluids without vomiting.  You vomit blood.  You have constant nausea and vomiting.  You are very weak.  You faint.  You have a fever and your symptoms suddenly get worse. Summary  Making some changes to your eating habits may help relieve nausea and vomiting.  This condition may be managed with lifestyle changes and medicines as prescribed by your health care provider.  If medicines do not help relieve nausea and vomiting, you may need to receive fluids through an IV at the hospital. This information is not intended to replace advice  given to you by your health care provider. Make sure you discuss any questions you have with your health care provider. Document Revised: 08/26/2019 Document Reviewed: 08/26/2019 Elsevier Patient Education  2021 [employer].

## 2020-03-27 NOTE — ED Triage Notes (Signed)
Emergency Medicine Provider OB Triage Evaluation Note  Leah Shea is a 35 y.o. female who is G5P2Ab2 who is at approximately [redacted] weeks gestation (LMP in December) who presents to the emergency department with complaints of nausea/vomiting. This has been ongoing for several days. She has not been abel to tolerate any PO in the last few days. She has tired zofran, phenergan and another medication she does not recall the name of. She has had some abdominal cramping which she believes is from the vomiting. No vaginal bleeding. She has seen the [place] for this pregnancy and was referred to OB/GYN and has an appointment with them in February.   Review of  Systems  Positive: nausea/vomiting Negative: vaginal bleeding  Physical Exam  BP (!) 131/98 (BP Location: Left Arm)   Pulse 93   Temp 98.3 F (36.8 C)   Resp 18   SpO2 98%  General: Awake, no distress  HEENT: Atraumatic  Resp: Normal effort  Cardiac: Normal rate Abd: Nondistended, nontender  MSK: Moves all extremities without difficulty Neuro: Speech clear  Medical Decision Making  Pt evaluated for pregnancy concern and is stable for transfer to MAU. Pt is in agreement with plan for transfer.  10:51 AM Discussed with MAU APP, Sam, who accepts patient in transfer.  Clinical Impression   1. Intractable vomiting with nausea, unspecified vomiting type     Portions of this note were generated with Dragon dictation software. Dictation errors may occur despite best attempts at proofreading.     Maxwell Caul, PA-C 03/27/20 1051

## 2020-04-08 ENCOUNTER — Ambulatory Visit (INDEPENDENT_AMBULATORY_CARE_PROVIDER_SITE_OTHER): Payer: No Typology Code available for payment source

## 2020-04-08 ENCOUNTER — Other Ambulatory Visit: Payer: Self-pay

## 2020-04-08 VITALS — BP 113/83 | HR 90 | Ht 66.5 in | Wt 190.9 lb

## 2020-04-08 DIAGNOSIS — O219 Vomiting of pregnancy, unspecified: Secondary | ICD-10-CM

## 2020-04-08 DIAGNOSIS — O3680X0 Pregnancy with inconclusive fetal viability, not applicable or unspecified: Secondary | ICD-10-CM

## 2020-04-08 DIAGNOSIS — Z3491 Encounter for supervision of normal pregnancy, unspecified, first trimester: Secondary | ICD-10-CM

## 2020-04-08 DIAGNOSIS — Z789 Other specified health status: Secondary | ICD-10-CM

## 2020-04-08 DIAGNOSIS — O0990 Supervision of high risk pregnancy, unspecified, unspecified trimester: Secondary | ICD-10-CM | POA: Insufficient documentation

## 2020-04-08 IMAGING — US US OB LIMITED
1 series · 4 of 4 positions shown · non-contrast
Comparison: none

[Series 1: us ob limited · 4 of 4 slices shown]
[im 1/4]
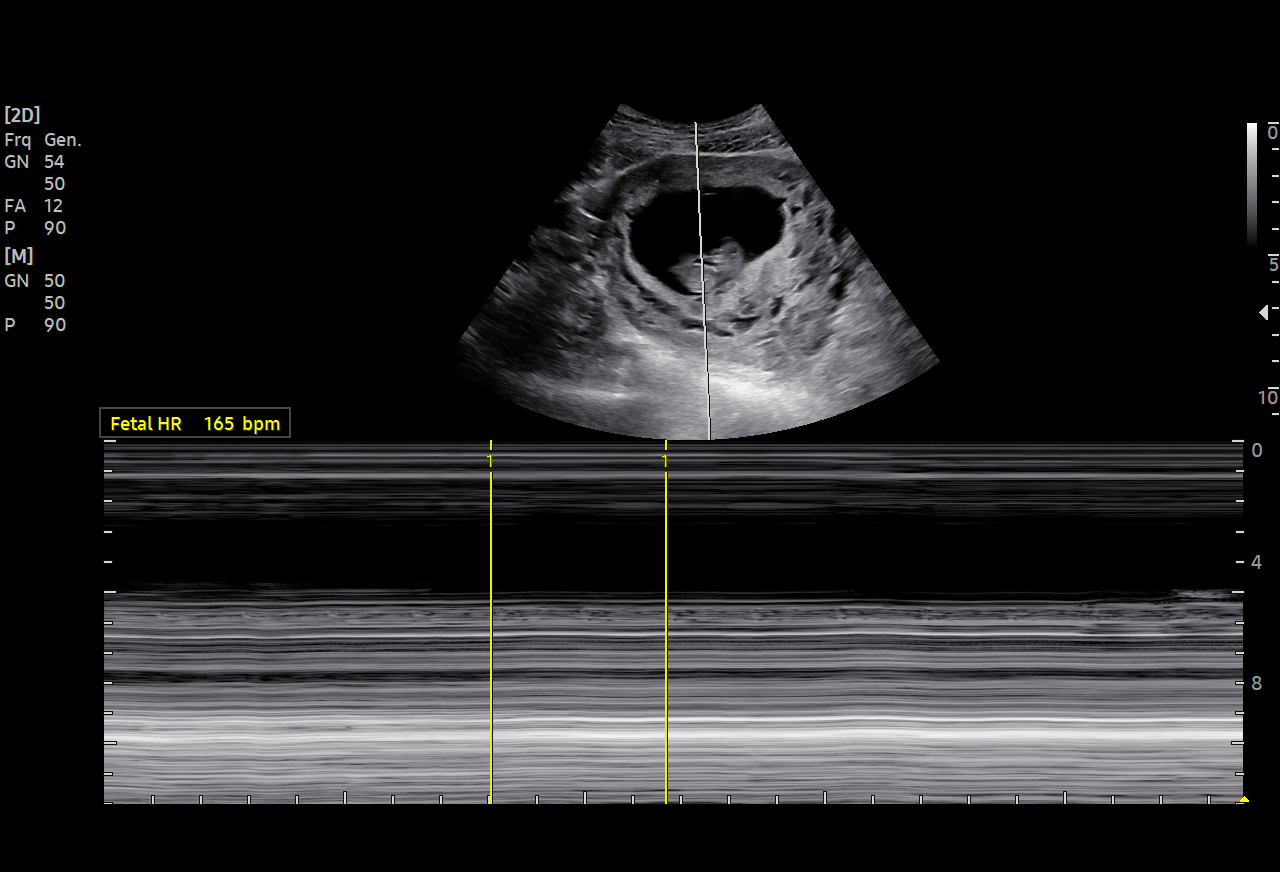
[im 2/4]
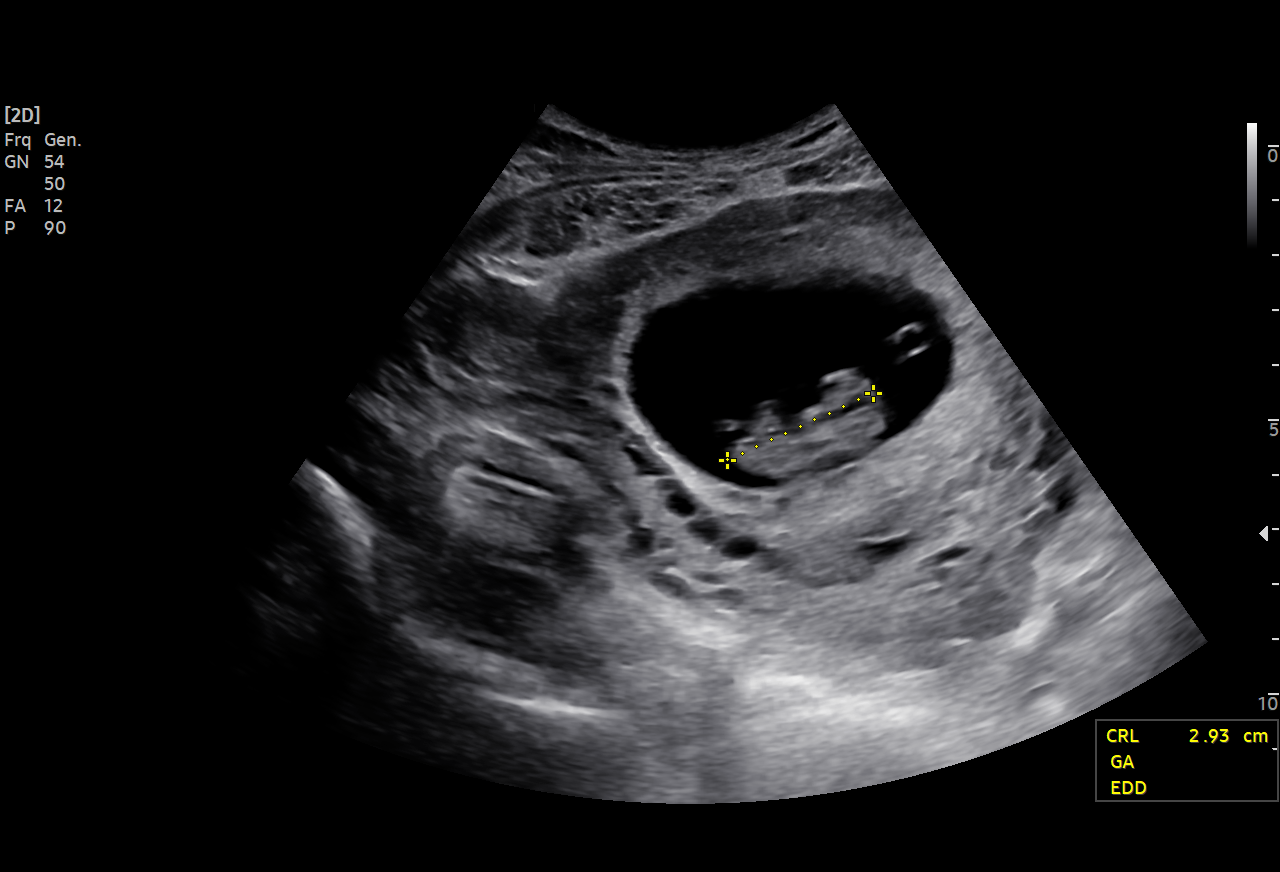
[im 3/4]
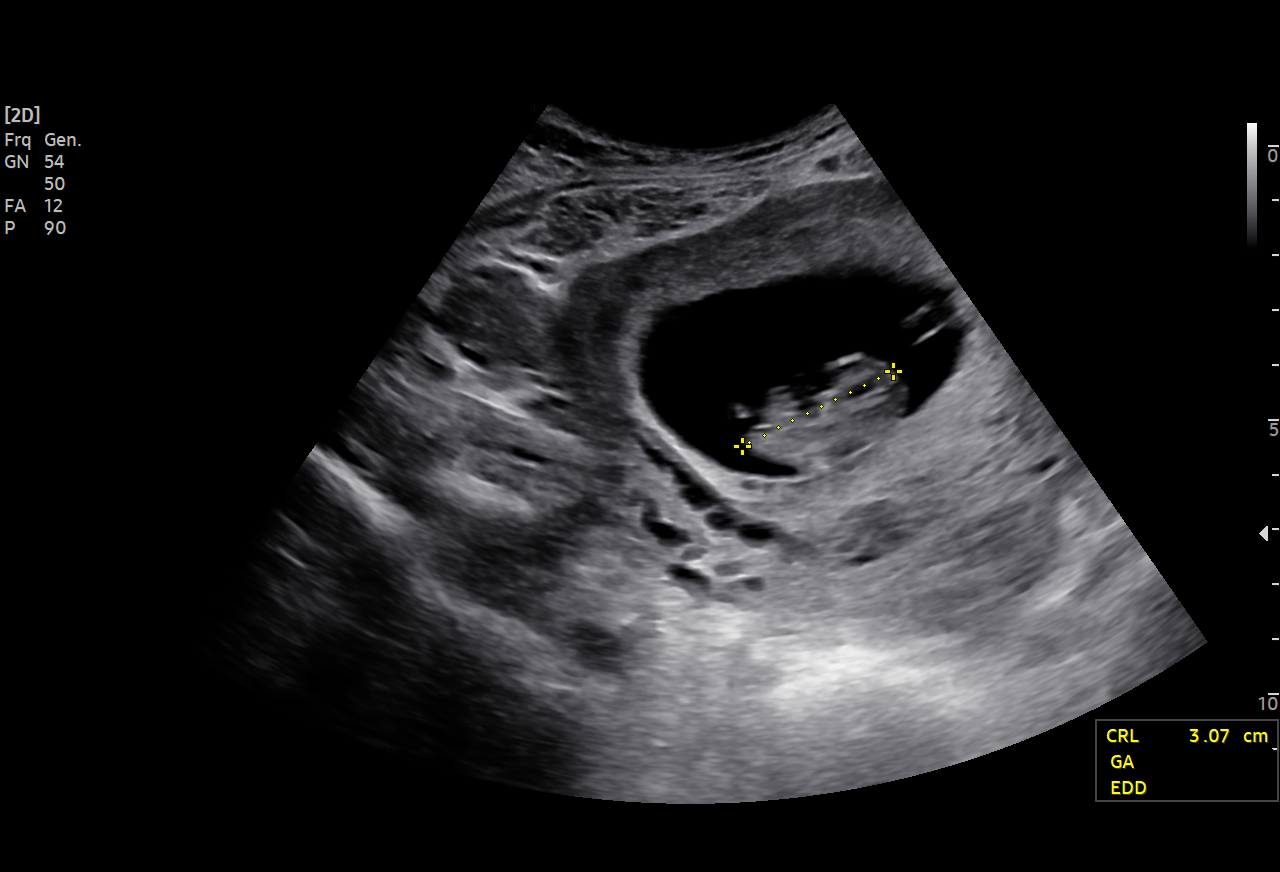
[im 4/4]
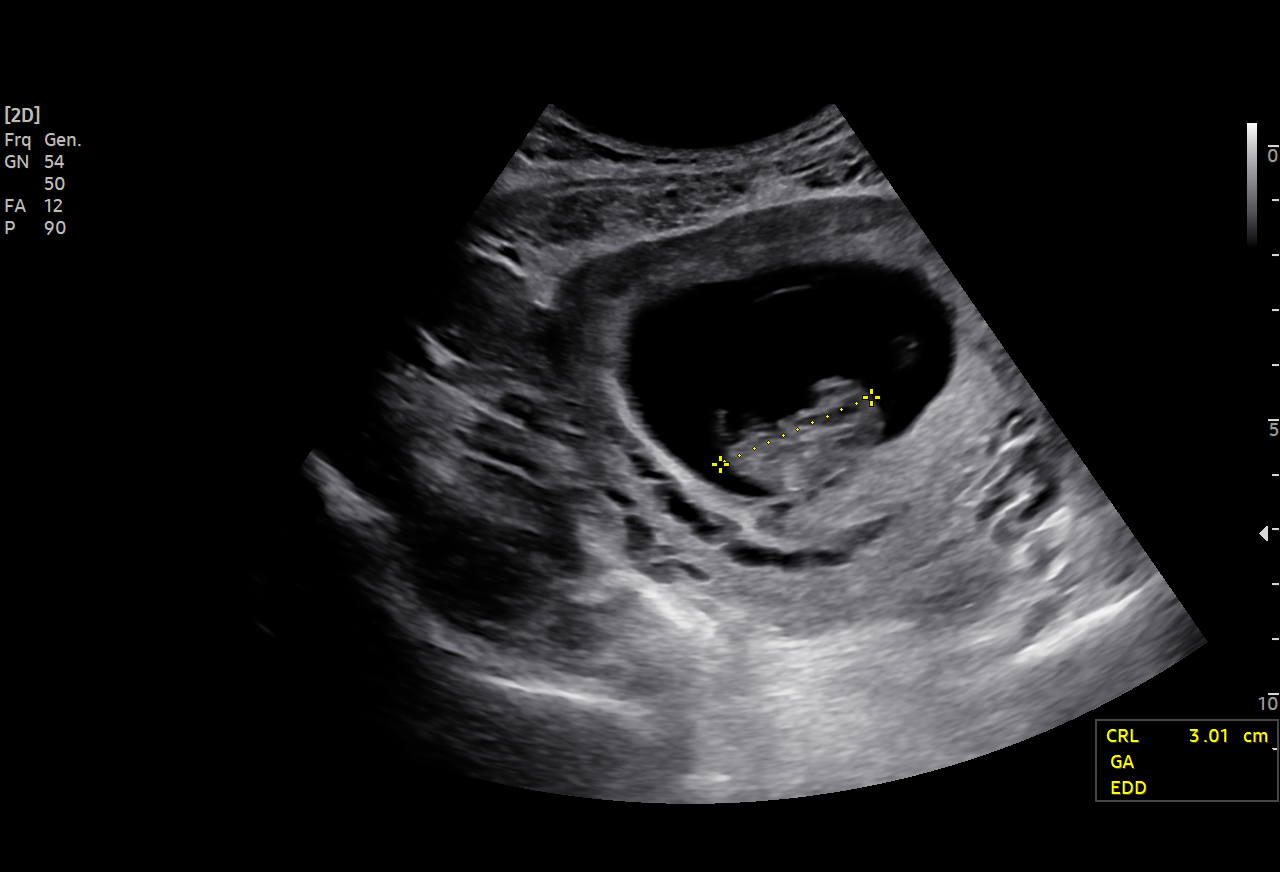

[4 of 4 positions shown; findings below may reference images not displayed]

[REDACTED]care

 1  [HOSPITAL]                         [address].0     TIGER

Indications

 9 weeks gestation of pregnancy
Fetal Evaluation

 Num Of Fetuses:         1
 Fetal Heart Rate(bpm):  165
 Cardiac Activity:       Observed
Biometry

 CRL:        30  mm     G. Age:  9w 5d                   EDD:   [DATE]
Gestational Age

 Best:          9w 5d      Det. By:  U/S C R L ([DATE])     EDD:   [DATE]
Comments

 Single live IUP at 9w5d by CRL. LMP [DATE]
Impression

 TIGER live IUP 9 weeks 5 days by CRL
Recommendations

 Follow up anatomy US at 18-20 weeks

## 2020-04-08 MED ORDER — BLOOD PRESSURE KIT DEVI
1.0000 | 0 refills | Status: DC
Start: 2020-04-08 — End: 2020-10-02

## 2020-04-08 MED ORDER — PROMETHAZINE HCL 25 MG PO TABS: 25.0000 mg | ORAL_TABLET | Freq: Four times a day (QID) | ORAL | 2 refills | Status: DC | PRN

## 2020-04-08 MED ORDER — ONDANSETRON 8 MG PO TBDP: 8.0000 mg | ORAL_TABLET | Freq: Three times a day (TID) | ORAL | 0 refills | Status: DC | PRN

## 2020-04-08 NOTE — Progress Notes (Unsigned)
PRENATAL INTAKE SUMMARY  Leah Shea presents today New OB Nurse Interview.  OB History    Gravida  7   Para  4   Term  2   Preterm  2   AB  2   Living  4     SAB  2   IAB      Ectopic      Multiple      Live Births  4          I have reviewed the patient's medical, obstetrical, social, and family histories, medications, and available lab results.  SUBJECTIVE She complains of having really bad nausea and vomiting. She states that she is not able to keep anything down. She is currently taking zofran 35m and using the scopalamine patch with no relief. Per Dr. ARoselie Awkwardincrease dosage of zofran to 8 mg in addition a rx for 25 mg phenergan was sent to the pharmacy. Patient verbalized understanding.  OBJECTIVE Initial Physical Exam (New OB)  GENERAL APPEARANCE: alert, well appearing   ASSESSMENT Normal pregnancy  PL[facility] Blood pressure kit ordered U/S performed today PHQ 2 score: 8 GAD 7 score: 8 Zofran dosage increased and phenergan 25 mg sent to pharmacy per Dr. ARoselie Awkward

## 2020-04-14 ENCOUNTER — Encounter: Payer: Self-pay | Admitting: Obstetrics and Gynecology

## 2020-04-14 ENCOUNTER — Other Ambulatory Visit: Payer: Self-pay

## 2020-04-14 ENCOUNTER — Other Ambulatory Visit (HOSPITAL_COMMUNITY)
Admission: RE | Admit: 2020-04-14 | Discharge: 2020-04-14 | Disposition: A | Discharge status: Home / Self Care | Payer: Medicaid Other | Source: Ambulatory Visit | Attending: Obstetrics and Gynecology | Admitting: Obstetrics and Gynecology

## 2020-04-14 ENCOUNTER — Ambulatory Visit (INDEPENDENT_AMBULATORY_CARE_PROVIDER_SITE_OTHER): Payer: No Typology Code available for payment source | Admitting: Obstetrics and Gynecology

## 2020-04-14 VITALS — BP 125/82 | HR 94 | Wt 198.6 lb

## 2020-04-14 DIAGNOSIS — Z3481 Encounter for supervision of other normal pregnancy, first trimester: Secondary | ICD-10-CM

## 2020-04-14 DIAGNOSIS — D75A Glucose-6-phosphate dehydrogenase (G6PD) deficiency without anemia: Secondary | ICD-10-CM | POA: Diagnosis not present

## 2020-04-14 DIAGNOSIS — G40909 Epilepsy, unspecified, not intractable, without status epilepticus: Secondary | ICD-10-CM | POA: Insufficient documentation

## 2020-04-14 DIAGNOSIS — Z1379 Encounter for other screening for genetic and chromosomal anomalies: Secondary | ICD-10-CM | POA: Diagnosis not present

## 2020-04-14 DIAGNOSIS — O99353 Diseases of the nervous system complicating pregnancy, third trimester: Secondary | ICD-10-CM | POA: Insufficient documentation

## 2020-04-14 DIAGNOSIS — Z3A10 10 weeks gestation of pregnancy: Secondary | ICD-10-CM | POA: Insufficient documentation

## 2020-04-14 DIAGNOSIS — Z98891 History of uterine scar from previous surgery: Secondary | ICD-10-CM

## 2020-04-14 DIAGNOSIS — O99351 Diseases of the nervous system complicating pregnancy, first trimester: Secondary | ICD-10-CM

## 2020-04-14 MED ORDER — FOLIC ACID 1 MG PO TABS: 1.0000 mg | ORAL_TABLET | Freq: Every day | ORAL | 10 refills | Status: DC

## 2020-04-14 NOTE — Progress Notes (Signed)
INITIAL PRENATAL VISIT NOTE  Subjective:  Leah Shea is a 35 y.o. [id number] at 68w6dby early ultrasound being seen today for her initial prenatal visit. She has an obstetric history significant for c/s x 3. She has a medical history significant for seizure disorder.  She [facility]  Patient reports nausea.   . Vag. Bleeding: None.  Movement: Absent. Denies leaking of fluid.    Past Medical History:  Diagnosis Date  . Anemia   . Anxiety   . Anxiety   . Asthma   . Blood dyscrasia    sickle trait  . Bronchitis, acute   . Complex partial seizures ([place] 2020  . Depression   . Diverticular disease   . Family history of anesthesia complication    son due sleep apnea  . Family history of breast cancer   . Family history of prostate cancer   . Headache(784.0)    migraines  . PTSD (post-traumatic stress disorder)   . Shortness of breath    with exertion or panic attack  . Sickle cell anemia (HCC) trait   patient claims asymptomatic  . Sleep apnea    [facility]   Past Surgical History:  Procedure Laterality Date  . BREAST SURGERY Bilateral    reductions  . CESAREAN SECTION  2010, [id number]  x 3  . EYE SURGERY     x 10 as a child  . PILONIDAL CYST EXCISION  2020  . TONSILLECTOMY N/A 10/07/2013   Procedure: TONSILLECTOMY;  Surgeon: DMelida Quitter MD;  Location: [facility]  Service: ENT;  Laterality: N/A;  . TUMOR REMOVAL     tumor removed from back    OB History  Gravida Para Term Preterm AB Living  7 4 2 2 2 4   SAB IAB Ectopic Multiple Live Births  2       4    # Outcome Date GA Lbr Len/2nd Weight Sex Delivery Anes PTL Lv  7 Current           6 Preterm 11/01/12 396w0d M CS-LTranv   LIV  5 Preterm 06/04/10 [id number] CS-LTranv   LIV  4 Term 01/21/09    M CS-LTranv   LIV  3 Term 12/26/04    F Vag-Spont   LIV  2 SAB           1 SAB             Social History   Socioeconomic History   . Marital status: Married    Spouse name: Not on file  . Number of children: Not on file  . Years of education: Not on file  . Highest education level: Not on file  Occupational History  . Not on file  Tobacco Use  . Smoking status: Former Smoker    Packs/day: 0.25    Years: 1.00    Pack years: 0.25    Types: Cigarettes    Quit date: 07/02/2013    Years since quitting: 6.7  . Smokeless tobacco: Never Used  Vaping Use  . Vaping Use: Never used  Substance and Sexual Activity  . Alcohol use: Not Currently  . Drug use: Not Currently    Types: Marijuana    Comment: last used January 3  . Sexual activity: Yes    Partners: Male    Birth control/protection: None  Other Topics Concern  .  Not on file  Social History Narrative  . Not on file   Social Determinants of Health   Financial Resource Strain: Not on file  Food Insecurity: Not on file  Transportation Needs: Not on file  Physical Activity: Not on file  Stress: Not on file  Social Connections: Not on file    Family History  Problem Relation Age of Onset  . Breast cancer Mother   . Breast cancer Maternal Aunt 36       metastatic  . Cancer Maternal Uncle        unk type  . Breast cancer Maternal Grandmother   . Prostate cancer Maternal Grandfather        metastatic  . Cancer Maternal Aunt        either breast or ovarian      Current Outpatient Medications:  .  Blood Pressure Monitoring (BLOOD PRESSURE KIT) DEVI, 1 kit by Does not apply route once a week., Disp: 1 each, Rfl: 0 .  folic acid (FOLVITE) 1 MG tablet, Take 1 tablet (1 mg total) by mouth daily., Disp: 30 tablet, Rfl: 10 .  lamoTRIgine (LAMICTAL) 200 MG tablet, Take 100-200 mg by mouth See admin instructions. 152m in the morning 2048min the evening, Disp: , Rfl:  .  ondansetron (ZOFRAN ODT) 4 MG disintegrating tablet, Take 1 tablet (4 mg total) by mouth every 6 (six) hours as needed for nausea., Disp: 20 tablet, Rfl: 0 .  ondansetron (ZOFRAN ODT) 8 MG  disintegrating tablet, Take 1 tablet (8 mg total) by mouth every 8 (eight) hours as needed for nausea or vomiting., Disp: 20 tablet, Rfl: 0 .  scopolamine (TRANSDERM-SCOP) 1 MG/3DAYS, Place 1 patch (1.5 mg total) onto the skin every 3 (three) days., Disp: 10 patch, Rfl: 12 .  acetaminophen (TYLENOL) 500 MG tablet, Take 2 tablets (1,000 mg total) by mouth every 8 (eight) hours as needed for moderate pain or fever. (Patient not taking: Reported on 04/14/2020), Disp: 30 tablet, Rfl: 0 .  levETIRAcetam (KEPPRA) 1000 MG tablet, Take 1,000 mg by mouth 3 (three) times daily. (Patient not taking: No sig reported), Disp: , Rfl:  .  promethazine (PHENERGAN) 25 MG tablet, Take 1 tablet (25 mg total) by mouth every 6 (six) hours as needed for nausea or vomiting. (Patient not taking: Reported on 04/14/2020), Disp: 30 tablet, Rfl: 2  Allergies  Allergen Reactions  . Other Anaphylaxis and Hives    Mushrooms  . Nsaids Hives, Swelling and Other (See Comments)    Body burns   . Reglan [Metoclopramide] Other (See Comments)    anxiety  . Sulfa Antibiotics Other (See Comments)    burns    Review of Systems: Negative except for what is mentioned in HPI.  Objective:   Vitals:   04/14/20 0945  BP: 125/82  Pulse: 94  Weight: 198 lb 9.6 oz (90.1 kg)    Fetal Status: Fetal Heart Rate (bpm): 156   Movement: Absent     Physical Exam: BP 125/82   Pulse 94   Wt 198 lb 9.6 oz (90.1 kg)   LMP 01/29/2020   BMI 31.57 kg/m  CONSTITUTIONAL: Well-developed, well-nourished female in no acute distress.  NEUROLOGIC: Alert and oriented to person, place, and time. Normal reflexes, muscle tone coordination. No cranial nerve deficit noted. PSYCHIATRIC: Normal mood and affect. Normal behavior. Normal judgment and thought content. SKIN: Skin is warm and dry. No rash noted. Not diaphoretic. No erythema. No pallor. HENT:  Normocephalic, atraumatic, External right  and left ear normal. Oropharynx is clear and moist EYES:  Conjunctivae and EOM are normal. Pupils are equal, round, and reactive to light. No scleral icterus.  NECK: Normal range of motion, supple, no masses CARDIOVASCULAR: Normal heart rate noted, regular rhythm RESPIRATORY: Effort and breath sounds normal, no problems with respiration noted BREASTS: symmetric, non-tender, no masses palpable ABDOMEN: Soft, nontender, nondistended, gravid. GU: normal appearing external female genitalia, normal appearing cervix, scant white discharge in vagina, no lesions noted Bimanual: 10 weeks sized uterus, no adnexal tenderness or palpable lesions noted MUSCULOSKELETAL: Normal range of motion. EXT:  No edema and no tenderness. 2+ distal pulses.   Assessment and Plan:  Pregnancy: [id number] at 7w6dby early u/s  1. G6PD deficiency May need genetic counseling in the future  2. Encounter for supervision of other normal pregnancy in first trimester  - Culture, OB Urine - Genetic Screening - CBC/D/Plt+RPR+Rh+ABO+Rub Ab... - Cytology - PAP( [place]) - Cervicovaginal ancillary only( [place]) - [place] OB DETAIL +14 WK; Future  3. Genetic testing   4. [redacted] weeks gestation of pregnancy   5. History of 3 cesarean sections Pt will need repeat c section, pt unsure about BTL  6. Seizure disorder during pregnancy in first trimester ([place] Pt continues with lamictal but her keppra has been held.  She will be seeing her neurologist soon for a medication revue.  Folic acid 1 mg added to her rx.   Preterm labor symptoms and general obstetric precautions including but not limited to vaginal bleeding, contractions, leaking of fluid and fetal movement were reviewed in detail with the patient.  Please refer to After Visit Summary for other counseling recommendations.   Return in about 4 weeks (around 05/12/2020[facility]  LGriffin Basil3/02/2020 12:21 PM

## 2020-04-14 NOTE — Patient Instructions (Signed)
https://www.cdc.gov/pregnancy/infections.html">  First Trimester of Pregnancy  The first trimester of pregnancy starts on the first day of your last menstrual period until the end of week 12. This is also called months 1 through 3 of pregnancy. Body changes during your first trimester Your body goes through many changes during pregnancy. The changes usually return to normal after your baby is born. Physical changes  You may gain or lose weight.  Your breasts may grow larger and hurt. The area around your nipples may get darker.  Dark spots or blotches may develop on your face.  You may have changes in your hair. Health changes  You may feel like you might vomit (nauseous), and you may vomit.  You may have heartburn.  You may have headaches.  You may have trouble pooping (constipation).  Your gums may bleed. Other changes  You may get tired easily.  You may pee (urinate) more often.  Your menstrual periods will stop.  You may not feel hungry.  You may want to eat certain kinds of food.  You may have changes in your emotions from day to day.  You may have more dreams. Follow these instructions at home: Medicines  Take over-the-counter and prescription medicines only as told by your doctor. Some medicines are not safe during pregnancy.  Take a prenatal vitamin that contains at least 600 micrograms (mcg) of folic acid. Eating and drinking  Eat healthy meals that include: ? Fresh fruits and vegetables. ? Whole grains. ? Good sources of protein, such as meat, eggs, or tofu. ? Low-fat dairy products.  Avoid raw meat and unpasteurized juice, milk, and cheese.  If you feel like you may vomit, or you vomit: ? Eat 4 or 5 small meals a day instead of 3 large meals. ? Try eating a few soda crackers. ? Drink liquids between meals instead of during meals.  You may need to take these actions to prevent or treat trouble pooping: ? Drink enough fluids to keep your pee  (urine) pale yellow. ? Eat foods that are high in fiber. These include beans, whole grains, and fresh fruits and vegetables. ? Limit foods that are high in fat and sugar. These include fried or sweet foods. Activity  Exercise only as told by your doctor. Most people can do their usual exercise routine during pregnancy.  Stop exercising if you have cramps or pain in your lower belly (abdomen) or low back.  Do not exercise if it is too hot or too humid, or if you are in a place of great height (high altitude).  Avoid heavy lifting.  If you choose to, you may have sex unless your doctor tells you not to. Relieving pain and discomfort  Wear a good support bra if your breasts are sore.  Rest with your legs raised (elevated) if you have leg cramps or low back pain.  If you have bulging veins (varicose veins) in your legs: ? Wear support hose as told by your doctor. ? Raise your feet for 15 minutes, 3-4 times a day. ? Limit salt in your food. Safety  Wear your seat belt at all times when you are in a car.  Talk with your doctor if someone is hurting you or yelling at you.  Talk with your doctor if you are feeling sad or have thoughts of hurting yourself. Lifestyle  Do not use hot tubs, steam rooms, or saunas.  Do not douche. Do not use tampons or scented sanitary pads.  Do not   use herbal medicines, illegal drugs, or medicines that are not approved by your doctor. Do not drink alcohol.  Do not smoke or use any products that contain nicotine or tobacco. If you need help quitting, ask your doctor.  Avoid cat litter boxes and soil that is used by cats. These carry germs that can cause harm to the baby and can cause a loss of your baby by miscarriage or stillbirth. General instructions  Keep all follow-up visits. This is important.  Ask for help if you need counseling or if you need help with nutrition. Your doctor can give you advice or tell you where to go for help.  Visit your  dentist. At home, brush your teeth with a soft toothbrush. Floss gently.  Write down your questions. Take them to your prenatal visits. Where to find more information  American Pregnancy Association: americanpregnancy.org  American College of Obstetricians and Gynecologists: www.acog.org  Office on Women's Health: womenshealth.gov/pregnancy Contact a doctor if:  You are dizzy.  You have a fever.  You have mild cramps or pressure in your lower belly.  You have a nagging pain in your belly area.  You continue to feel like you may vomit, you vomit, or you have watery poop (diarrhea) for 24 hours or longer.  You have a bad-smelling fluid coming from your vagina.  You have pain when you pee.  You are exposed to a disease that spreads from person to person, such as chickenpox, measles, Zika virus, HIV, or hepatitis. Get help right away if:  You have spotting or bleeding from your vagina.  You have very bad belly cramping or pain.  You have shortness of breath or chest pain.  You have any kind of injury, such as from a fall or a car crash.  You have new or increased pain, swelling, or redness in an arm or leg. Summary  The first trimester of pregnancy starts on the first day of your last menstrual period until the end of week 12 (months 1 through 3).  Eat 4 or 5 small meals a day instead of 3 large meals.  Do not smoke or use any products that contain nicotine or tobacco. If you need help quitting, ask your doctor.  Keep all follow-up visits. This information is not intended to replace advice given to you by your health care provider. Make sure you discuss any questions you have with your health care provider. Document Revised: 07/10/2019 Document Reviewed: 05/16/2019 Elsevier Patient Education  2021 [employer]  

## 2020-04-15 LAB — HCV INTERPRETATION

## 2020-04-15 LAB — CBC/D/PLT+RPR+RH+ABO+RUB AB...
Antibody Screen: NEGATIVE
Basophils Absolute: 0.1 10*3/uL (ref 0.0–0.2)
Basos: 1 %
EOS (ABSOLUTE): 0.1 10*3/uL (ref 0.0–0.4)
Eos: 2 %
HCV Ab: <0.1 s/co ratio (ref 0.0–0.9)
HIV Screen 4th Generation wRfx: NONREACTIVE
Hematocrit: 33.5 % — ABNORMAL LOW (ref 34.0–46.6)
Hemoglobin: 11.7 g/dL (ref 11.1–15.9)
Hepatitis B Surface Ag: NEGATIVE
Immature Grans (Abs): 0 10*3/uL (ref 0.0–0.1)
Immature Granulocytes: 1 %
Lymphocytes Absolute: 2.3 10*3/uL (ref 0.7–3.1)
Lymphs: 28 %
MCH: 31.1 pg (ref 26.6–33.0)
MCHC: 34.9 g/dL (ref 31.5–35.7)
MCV: 89 fL (ref 79–97)
Monocytes Absolute: 0.6 10*3/uL (ref 0.1–0.9)
Monocytes: 7 %
Neutrophils Absolute: 5 10*3/uL (ref 1.4–7.0)
Neutrophils: 61 %
Platelets: 230 10*3/uL (ref 150–450)
RBC: 3.76 x10E6/uL — ABNORMAL LOW (ref 3.77–5.28)
RDW: 11.8 % (ref 11.7–15.4)
RPR Ser Ql: NONREACTIVE
Rh Factor: POSITIVE
Rubella Antibodies, IGG: 8.22 index (ref >0.99)
WBC: 8 10*3/uL (ref 3.4–10.8)

## 2020-04-15 LAB — CERVICOVAGINAL ANCILLARY ONLY
Chlamydia: NEGATIVE
Comment: NEGATIVE
Comment: NEGATIVE
Comment: NORMAL
Neisseria Gonorrhea: NEGATIVE
Trichomonas: NEGATIVE

## 2020-04-16 LAB — CYTOLOGY - PAP
Comment: NEGATIVE
Diagnosis: NEGATIVE
High risk HPV: NEGATIVE

## 2020-04-16 LAB — CULTURE, OB URINE

## 2020-04-16 LAB — URINE CULTURE, OB REFLEX

## 2020-04-21 ENCOUNTER — Encounter: Payer: Self-pay | Admitting: Obstetrics and Gynecology

## 2020-04-28 ENCOUNTER — Encounter: Payer: Self-pay | Admitting: Obstetrics and Gynecology

## 2020-04-30 ENCOUNTER — Inpatient Hospital Stay (HOSPITAL_COMMUNITY)
Admission: AD | Admit: 2020-04-30 | Discharge: 2020-04-30 | Disposition: A | Discharge status: Home / Self Care | Payer: No Typology Code available for payment source | Attending: Obstetrics and Gynecology | Admitting: Obstetrics and Gynecology

## 2020-04-30 ENCOUNTER — Encounter (HOSPITAL_COMMUNITY): Payer: Self-pay | Admitting: Obstetrics and Gynecology

## 2020-04-30 ENCOUNTER — Other Ambulatory Visit: Payer: Self-pay

## 2020-04-30 DIAGNOSIS — M545 Low back pain, unspecified: Secondary | ICD-10-CM | POA: Diagnosis not present

## 2020-04-30 DIAGNOSIS — Z87891 Personal history of nicotine dependence: Secondary | ICD-10-CM | POA: Diagnosis not present

## 2020-04-30 DIAGNOSIS — Z3A13 13 weeks gestation of pregnancy: Secondary | ICD-10-CM | POA: Insufficient documentation

## 2020-04-30 DIAGNOSIS — O26891 Other specified pregnancy related conditions, first trimester: Secondary | ICD-10-CM | POA: Diagnosis present

## 2020-04-30 DIAGNOSIS — O210 Mild hyperemesis gravidarum: Secondary | ICD-10-CM

## 2020-04-30 DIAGNOSIS — Z6790 Unspecified blood type, Rh positive: Secondary | ICD-10-CM | POA: Diagnosis not present

## 2020-04-30 DIAGNOSIS — Z6710 Type A blood, Rh positive: Secondary | ICD-10-CM

## 2020-04-30 DIAGNOSIS — O4691 Antepartum hemorrhage, unspecified, first trimester: Secondary | ICD-10-CM | POA: Diagnosis not present

## 2020-04-30 DIAGNOSIS — O219 Vomiting of pregnancy, unspecified: Secondary | ICD-10-CM

## 2020-04-30 DIAGNOSIS — O10919 Unspecified pre-existing hypertension complicating pregnancy, unspecified trimester: Secondary | ICD-10-CM | POA: Diagnosis present

## 2020-04-30 DIAGNOSIS — O209 Hemorrhage in early pregnancy, unspecified: Secondary | ICD-10-CM

## 2020-04-30 HISTORY — DX: Unspecified pre-existing hypertension complicating pregnancy, unspecified trimester: O10.919

## 2020-04-30 LAB — URINALYSIS, ROUTINE W REFLEX MICROSCOPIC
Bilirubin Urine: NEGATIVE
Glucose, UA: NEGATIVE mg/dL
Hgb urine dipstick: NEGATIVE
Ketones, ur: NEGATIVE mg/dL
Leukocytes,Ua: NEGATIVE
Nitrite: NEGATIVE
Protein, ur: NEGATIVE mg/dL
Specific Gravity, Urine: 1.016 (ref 1.005–1.030)
pH: 5 (ref 5.0–8.0)

## 2020-04-30 LAB — WET PREP, GENITAL
Clue Cells Wet Prep HPF POC: NONE SEEN
Sperm: NONE SEEN
Trich, Wet Prep: NONE SEEN
Yeast Wet Prep HPF POC: NONE SEEN

## 2020-04-30 MED ORDER — LACTATED RINGERS IV BOLUS
1000.0000 mL | Freq: Once | INTRAVENOUS | Status: AC
  Administered 2020-04-30: 1000 mL via INTRAVENOUS

## 2020-04-30 MED ORDER — ONDANSETRON 8 MG PO TBDP: 8.0000 mg | ORAL_TABLET | Freq: Three times a day (TID) | ORAL | 1 refills | Status: DC | PRN

## 2020-04-30 MED ORDER — FAMOTIDINE IN NACL 20-0.9 MG/50ML-% IV SOLN
20.0000 mg | Freq: Once | INTRAVENOUS | Status: AC
  Administered 2020-04-30: 20 mg via INTRAVENOUS
  Filled 2020-04-30: qty 50

## 2020-04-30 MED ORDER — SODIUM CHLORIDE 0.9 % IV SOLN
8.0000 mg | Freq: Once | INTRAVENOUS | Status: AC
  Administered 2020-04-30: 8 mg via INTRAVENOUS
  Filled 2020-04-30: qty 4

## 2020-04-30 MED ORDER — ACETAMINOPHEN 500 MG PO TABS
1000.0000 mg | ORAL_TABLET | Freq: Once | ORAL | Status: AC
  Administered 2020-04-30: 1000 mg via ORAL
  Filled 2020-04-30: qty 2

## 2020-04-30 MED ORDER — DIPHENHYDRAMINE HCL 50 MG PO TABS: 25.0000 mg | ORAL_TABLET | Freq: Every evening | ORAL | 0 refills | Status: DC | PRN

## 2020-04-30 NOTE — MAU Provider Note (Signed)
History     CSN: [id number]  Arrival date and time: 04/30/20 1630   Event Date/Time   First Provider Initiated Contact with Patient 04/30/20 1804      Chief Complaint  Patient presents with  . Nausea  . Emesis  . Back Pain  . Vaginal Bleeding   HPI   [name] is a 35 y.o. female G7P2224 @ 35w1dhere in MAU with nausea and vomiting.  Reports 10 episodes of vomiting in the last 24 hours. She has taken 8 mg of Zofran this morning allong with phenergan with no improvement. She reports vaginal bleeding that started on Sunday. She reports small clots and scant amount of bleeding. Some lower back pain that comes and goes. No abdominal pain. Reports no longer smoking marijuana, she stopped 2 weeks ago.  Reports severe hyperemesis with the last 2 pregnancies. Has scopolamine patch on.   OB History    Gravida  7   Para  4   Term  2   Preterm  2   AB  2   Living  4     SAB  2   IAB      Ectopic      Multiple      Live Births  4           Past Medical History:  Diagnosis Date  . Anemia   . Anxiety   . Anxiety   . Asthma   . Blood dyscrasia    sickle trait  . Bronchitis, acute   . Complex partial seizures ([place] 2020  . Depression   . Diverticular disease   . Family history of anesthesia complication    son due sleep apnea  . Family history of breast cancer   . Family history of prostate cancer   . Headache(784.0)    migraines  . PTSD (post-traumatic stress disorder)   . Shortness of breath    with exertion or panic attack  . Sickle cell anemia (HCC) trait   patient claims asymptomatic  . Sleep apnea    [facility]   Past Surgical History:  Procedure Laterality Date  . BREAST SURGERY Bilateral    reductions  . CESAREAN SECTION  2010, [id number]  x 3  . EYE SURGERY     x 10 as a child  . PILONIDAL CYST EXCISION  2020  . TONSILLECTOMY N/A 10/07/2013   Procedure: TONSILLECTOMY;  Surgeon: DMelida Quitter MD;   [facility]  Service: ENT;  Laterality: N/A;  . TUMOR REMOVAL     tumor removed from back    Family History  Problem Relation Age of Onset  . Breast cancer Mother   . Breast cancer Maternal Aunt 36       metastatic  . Cancer Maternal Uncle        unk type  . Breast cancer Maternal Grandmother   . Prostate cancer Maternal Grandfather        metastatic  . Cancer Maternal Aunt        either breast or ovarian     Social History   Tobacco Use  . Smoking status: Former Smoker    Packs/day: 0.25    Years: 1.00    Pack years: 0.25    Types: Cigarettes    Quit date: 07/02/2013    Years since quitting: 6.8  . Smokeless tobacco: Never Used  Vaping Use  . Vaping Use: Never used  Substance Use Topics  . Alcohol use: Not Currently  . Drug use: Not Currently    Types: Marijuana    Comment: last used January 3    Allergies:  Allergies  Allergen Reactions  . Other Anaphylaxis and Hives    Mushrooms  . Nsaids Hives, Swelling and Other (See Comments)    Body burns   . Reglan [Metoclopramide] Other (See Comments)    anxiety  . Sulfa Antibiotics Other (See Comments)    burns    Medications Prior to Admission  Medication Sig Dispense Refill Last Dose  . acetaminophen (TYLENOL) 500 MG tablet Take 2 tablets (1,000 mg total) by mouth every 8 (eight) hours as needed for moderate pain or fever. (Patient not taking: Reported on 04/14/2020) 30 tablet 0   . Blood Pressure Monitoring (BLOOD PRESSURE KIT) DEVI 1 kit by Does not apply route once a week. 1 each 0   . folic acid (FOLVITE) 1 MG tablet Take 1 tablet (1 mg total) by mouth daily. 30 tablet 10   . lamoTRIgine (LAMICTAL) 200 MG tablet Take 100-200 mg by mouth See admin instructions. 118m in the morning 2068min the evening     . levETIRAcetam (KEPPRA) 1000 MG tablet Take 1,000 mg by mouth 3 (three) times daily. (Patient not taking: No sig reported)     . ondansetron (ZOFRAN ODT) 4 MG disintegrating tablet Take 1 tablet (4 mg  total) by mouth every 6 (six) hours as needed for nausea. 20 tablet 0   . ondansetron (ZOFRAN ODT) 8 MG disintegrating tablet Take 1 tablet (8 mg total) by mouth every 8 (eight) hours as needed for nausea or vomiting. 20 tablet 0   . promethazine (PHENERGAN) 25 MG tablet Take 1 tablet (25 mg total) by mouth every 6 (six) hours as needed for nausea or vomiting. (Patient not taking: Reported on 04/14/2020) 30 tablet 2   . scopolamine (TRANSDERM-SCOP) 1 MG/3DAYS Place 1 patch (1.5 mg total) onto the skin every 3 (three) days. 10 patch 12    Results for orders placed or performed during the hospital encounter of 04/30/20 (from the past 48 hour(s))  Urinalysis, Routine w reflex microscopic Urine, Clean Catch     Status: Abnormal   Collection Time: 04/30/20  5:24 PM  Result Value Ref Range   Color, Urine YELLOW YELLOW   APPearance HAZY (A) CLEAR   Specific Gravity, Urine 1.016 1.005 - 1.030   pH 5.0 5.0 - 8.0   Glucose, UA NEGATIVE NEGATIVE mg/dL   Hgb urine dipstick NEGATIVE NEGATIVE   Bilirubin Urine NEGATIVE NEGATIVE   Ketones, ur NEGATIVE NEGATIVE mg/dL   Protein, ur NEGATIVE NEGATIVE mg/dL   Nitrite NEGATIVE NEGATIVE   Leukocytes,Ua NEGATIVE NEGATIVE    Comment: Performed at [facility] [place] [id number] prep, genital     Status: Abnormal   Collection Time: 04/30/20  7:40 PM  Result Value Ref Range   Yeast Wet Prep HPF POC NONE SEEN NONE SEEN   Trich, Wet Prep NONE SEEN NONE SEEN   Clue Cells Wet Prep HPF POC NONE SEEN NONE SEEN   WBC, Wet Prep HPF POC FEW (A) NONE SEEN   Sperm NONE SEEN     Comment: Performed at [facility] [place] [id number] Review of Systems  Constitutional: Negative for fever.  Gastrointestinal: Negative for abdominal pain.  Genitourinary: Positive for vaginal bleeding. Negative for flank pain.  Physical Exam   Blood pressure (!) 148/79, pulse (!) 103, temperature 98.6 F (37 C),  temperature source Oral, resp. rate 16, height 5' 6.5" (1.689 m), weight 89.5 kg, last menstrual period 01/29/2020, SpO2 100 %.  Physical Exam Constitutional:      General: She is not in acute distress.    Appearance: She is ill-appearing. She is not toxic-appearing or diaphoretic.  HENT:     Head: Normocephalic.  Genitourinary:    Comments: Wet prep and GC collected without speculum.  Cervix closed, thick, long, posterior.  Scant amount of brown discharge on swabs Exam by Anderson Malta, NP  Musculoskeletal:        General: Normal range of motion.  Skin:    General: Skin is warm.  Neurological:     Mental Status: She is alert.  Psychiatric:        Mood and Affect: Affect is flat and tearful.        Behavior: Behavior is withdrawn.    MAU Course  Procedures  None  MDM  UA with signs of significant dehydration, no ketones noted.  Weight 3/1: 90.08 kg Weight 3/17: 89.54  Assessment and Plan   A:  1. Nausea and vomiting in pregnancy   2. [redacted] weeks gestation of pregnancy   3. Vaginal bleeding affecting early pregnancy   4. Type A blood, Rh positive   5. Nausea and vomiting during pregnancy     P:  Discharge home in stable condition Continue Zofran and phenergan; refills given Rx: Benadryl Return to MAU if symptoms worsen Small frequent meals Continue Scope patch   Kaj Vasil, Artist Pais, NP 04/30/2020 8:12 PM

## 2020-04-30 NOTE — Discharge Instructions (Signed)
Nausea and Vomiting, Adult Nausea is feeling sick to your stomach or feeling that you are about to throw up (vomit). Vomiting is when food in your stomach is thrown up and out of the mouth. Throwing up can make you feel weak. It can also make you lose too much water in your body (get dehydrated). If you lose too much water in your body, you may:  Feel tired.  Feel thirsty.  Have a dry mouth.  Have cracked lips.  Go pee (urinate) less often. Older adults and people with other diseases or a weak body defense system (immune system) are at higher risk for losing too much water in the body. If you feel sick to your stomach and you throw up, it is important to follow instructions from your doctor about how to take care of yourself. Follow these instructions at home: Watch your symptoms for any changes. Tell your doctor about them. Follow these instructions to care for yourself at home. Eating and drinking  Take an ORS (oral rehydration solution). This is a drink that is sold at pharmacies and stores.  Drink clear fluids in small amounts as you are able, such as: ? Water. ? Ice chips. ? Fruit juice that has water added (diluted fruit juice). ? Low-calorie sports drinks.  Eat bland, easy-to-digest foods in small amounts as you are able, such as: ? Bananas. ? Applesauce. ? Rice. ? Low-fat (lean) meats. ? Toast. ? Crackers.  Avoid drinking fluids that have a lot of sugar or caffeine in them. This includes energy drinks, sports drinks, and soda.  Avoid alcohol.  Avoid spicy or fatty foods.      General instructions  Take over-the-counter and prescription medicines only as told by your doctor.  Drink enough fluid to keep your pee (urine) pale yellow.  Wash your hands often with soap and water. If you cannot use soap and water, use hand sanitizer.  Make sure that all people in your home wash their hands well and often.  Rest at home while you get better.  Watch your condition  for any changes.  Take slow and deep breaths when you feel sick to your stomach.  Keep all follow-up visits as told by your doctor. This is important. Contact a doctor if:  Your symptoms get worse.  You have new symptoms.  You have a fever.  You cannot drink fluids without throwing up.  You feel sick to your stomach for more than 2 days.  You feel light-headed or dizzy.  You have a headache.  You have muscle cramps.  You have a rash.  You have pain while peeing. Get help right away if:  You have pain in your chest, neck, arm, or jaw.  You feel very weak or you pass out (faint).  You throw up again and again.  You have throw up that is bright red or looks like black coffee grounds.  You have bloody or black poop (stools) or poop that looks like tar.  You have a very bad headache, a stiff neck, or both.  You have very bad pain, cramping, or bloating in your belly (abdomen).  You have trouble breathing.  You are breathing very quickly.  Your heart is beating very quickly.  Your skin feels cold and clammy.  You feel confused.  You have signs of losing too much water in your body, such as: ? Dark pee, very little pee, or no pee. ? Cracked lips. ? Dry mouth. ? Sunken eyes. ?   Sleepiness. ? Weakness. These symptoms may be an emergency. Do not wait to see if the symptoms will go away. Get medical help right away. Call your local emergency services (911 in the [place] Do not drive yourself to the hospital. Summary  Nausea is feeling sick to your stomach or feeling that you are about to throw up (vomit). Vomiting is when food in your stomach is thrown up and out of the mouth.  Follow instructions from your doctor about eating and drinking to keep from losing too much water in your body.  Take over-the-counter and prescription medicines only as told by your doctor.  Contact your doctor if your symptoms get worse or you have new symptoms.  Keep all follow-up  visits as told by your doctor. This is important. This information is not intended to replace advice given to you by your health care provider. Make sure you discuss any questions you have with your health care provider. Document Revised: 05/25/2018 Document Reviewed: 07/11/2017 Elsevier Patient Education  2021 Elsevier Inc.  

## 2020-04-30 NOTE — MAU Note (Signed)
Leah Shea is a 35 y.o. at [redacted]w[redacted]d here in MAU reporting: passed 2 small clots on Sunday and has been bleeding since then. Mostly sees bleeding when she wipes. Also having some back pain and nausea and vomiting. 10 episodes of vomiting in the past 24 hours. Last IC was Saturday.   Onset of complaint: ongoing  Pain score: 7/10  Vitals:   04/30/20 1648  BP: (!) 148/79  Pulse: (!) 103  Resp: 16  Temp: 98.6 F (37 C)  SpO2: 100%     Lab orders placed from triage: UA

## 2020-05-01 LAB — GC/CHLAMYDIA PROBE AMP (~~LOC~~) NOT AT ARMC
Chlamydia: NEGATIVE
Comment: NEGATIVE
Comment: NORMAL
Neisseria Gonorrhea: NEGATIVE

## 2020-05-12 ENCOUNTER — Other Ambulatory Visit: Payer: Self-pay

## 2020-05-12 ENCOUNTER — Ambulatory Visit (INDEPENDENT_AMBULATORY_CARE_PROVIDER_SITE_OTHER): Payer: No Typology Code available for payment source | Admitting: Obstetrics & Gynecology

## 2020-05-12 DIAGNOSIS — O10919 Unspecified pre-existing hypertension complicating pregnancy, unspecified trimester: Secondary | ICD-10-CM

## 2020-05-12 DIAGNOSIS — Z3481 Encounter for supervision of other normal pregnancy, first trimester: Secondary | ICD-10-CM

## 2020-05-12 MED ORDER — PANTOPRAZOLE SODIUM 40 MG PO TBEC: 40.0000 mg | DELAYED_RELEASE_TABLET | Freq: Every day | ORAL | 6 refills | Status: DC

## 2020-05-12 NOTE — Patient Instructions (Signed)
Hyperemesis Gravidarum Hyperemesis gravidarum is a severe form of nausea and vomiting that happens during pregnancy. Hyperemesis is worse than morning sickness. It may cause you to have nausea or vomiting all day for many days. It may keep you from eating and drinking enough food and liquids, which can lead to dehydration, malnutrition, and weight loss. Hyperemesis usually occurs during the first half (the first 20 weeks) of pregnancy. It often goes away once a woman is in her second half of pregnancy. However, sometimes hyperemesis continues through an entire pregnancy. What are the causes? The cause of this condition is not known. It may be associated with:  Changes in hormones in the body during pregnancy.  Changes in the gastrointestinal system.  Genetic or inherited conditions. What are the signs or symptoms? Symptoms of this condition include:  Severe nausea and vomiting that does not go away.  Problems keeping food down.  Weight loss.  Loss of body fluid (dehydration).  Loss of appetite. You may have no desire to eat or you may not like the food you have previously enjoyed. How is this diagnosed? This condition may be diagnosed based on your medical history, your symptoms, and a physical exam. You may also have other tests, including:  Blood tests.  Urine tests.  Blood pressure tests.  Ultrasound to look for problems with the placenta or to check if you are pregnant with more than one baby. How is this treated? This condition is managed by controlling symptoms. This may include:  Following an eating plan. This can help to lessen nausea and vomiting.  Treatments that do not use medicine. These include acupressure bracelets, hypnosis, and eating or drinking foods or fluids that contain ginger, ginger ale, or ginger tea.  Taking prescription medicine or over-the-counter medicine as told by your health care provider.  Continuing to take prenatal vitamins. You may need to  change what kind you take and when you take them. Follow your health care provider's instructions about prenatal vitamins. An eating plan and medicines are often used together to help control symptoms. If medicines do not help relieve nausea and vomiting, you may need to receive fluids through an IV at the hospital. Follow these instructions at home: To help relieve your symptoms, listen to your body. Everyone is different and has different preferences. Find what works best for you. Here are some things you can try to help relieve your symptoms: Meals and snacks  Eat 5-6 small meals daily instead of 3 large meals. Eating small meals and snacks can help you avoid an empty stomach.  Before getting out of bed, eat a couple of crackers to avoid moving around on an empty stomach.  Eat a protein-rich snack before bed. Examples include cheese and crackers, or a peanut butter sandwich made with 1 slice of whole-wheat bread and 1 tsp (5 g) of peanut butter.  Eat and drink slowly.  Try eating starchy foods as these are usually tolerated well. Examples include cereal, toast, bread, potatoes, pasta, rice, and pretzels.  Eat at least one serving of protein with your meals and snacks. Protein options include lean meats, poultry, seafood, beans, nuts, nut butters, eggs, cheese, and yogurt.  Eat or suck on things that have ginger in them. It may help to relieve nausea. Add  tsp (0.44 g) ground ginger to hot tea, or choose ginger tea.   Fluids It is important to stay hydrated. Try to:  Drink small amounts of fluids often.  Drink fluids 30 minutes   before or after a meal to help lessen the feeling of a full stomach.  Drink 100% fruit juice or an electrolyte drink. An electrolyte drink contains sodium, potassium, and chloride.  Drink fluids that are cold, clear, and carbonated or sour. These include lemonade, ginger ale, lemon-lime soda, ice water, and sparkling water. Things to avoid Avoid the  following:  Eating foods that trigger your symptoms. These may include spicy foods, coffee, high-fat foods, very sweet foods, and acidic foods.  Drinking more than 1 cup of fluid at a time.  Skipping meals. Nausea can be more intense on an empty stomach. If you cannot tolerate food, do not force it. Try sucking on ice chips or other frozen items and make up for missed calories later.  Lying down within 2 hours after eating.  Being exposed to environmental triggers. These may include food smells, smoky rooms, closed spaces, rooms with strong smells, warm or humid places, overly loud and noisy rooms, and rooms with motion or flickering lights. Try eating meals in a well-ventilated area that is free of strong smells.  Making quick and sudden changes in your movement.  Taking iron pills and multivitamins that contain iron. If you take prescription iron pills, do not stop taking them unless your health care provider approves.  Preparing food. The smell of food can spoil your appetite or trigger nausea. General instructions  Brush your teeth or use a mouth rinse after meals.  Take over-the-counter and prescription medicines only as told by your health care provider.  Follow instructions from your health care provider about eating or drinking restrictions.  Talk with your health care provider about starting a supplement of vitamin B6.  Continue to take your prenatal vitamins as told by your health care provider. If you are having trouble taking your prenatal vitamins, talk with your health care provider about other options.  Keep all follow-up visits. This is important. Follow-up visits include prenatal visits. Contact a health care provider if:  You have pain in your abdomen.  You have a severe headache.  You have vision problems.  You are losing weight.  You feel weak or dizzy.  You cannot eat or drink without vomiting, especially if this goes on for a full day. Get help right  away if:  You cannot drink fluids without vomiting.  You vomit blood.  You have constant nausea and vomiting.  You are very weak.  You faint.  You have a fever and your symptoms suddenly get worse. Summary  Hyperemesis gravidarum is a severe form of nausea and vomiting that happens during pregnancy.  Making some changes to your eating habits may help relieve nausea and vomiting.  This condition may be managed with lifestyle changes and medicines as prescribed by your health care provider.  If medicines do not help relieve nausea and vomiting, you may need to receive fluids through an IV at the hospital. This information is not intended to replace advice given to you by your health care provider. Make sure you discuss any questions you have with your health care provider. Document Revised: 08/26/2019 Document Reviewed: 08/26/2019 Elsevier Patient Education  2021 Elsevier Inc.  

## 2020-05-12 NOTE — Progress Notes (Signed)
   PRENATAL VISIT NOTE  Subjective:  Leah Shea is a 35 y.o. J5H8343 at [redacted]w[redacted]d being seen today for ongoing prenatal care.  She is currently monitored for the following issues for this high-risk pregnancy and has Genetic testing; G6PD deficiency; Encounter for supervision of normal pregnancy in first trimester; History of 3 cesarean sections; Seizure disorder during pregnancy in first trimester [facility]); and Chronic hypertension affecting pregnancy on their problem list.  Patient reports heartburn, nausea and vomiting.  Contractions: Not present. Vag. Bleeding: None.  Movement: Present. Denies leaking of fluid.   The following portions of the patient's history were reviewed and updated as appropriate: allergies, current medications, past family history, past medical history, past social history, past surgical history and problem list.   Objective:   Vitals:   05/12/20 1024  BP: 117/81  Pulse: 97  Weight: 201 lb (91.2 kg)    Fetal Status: Fetal Heart Rate (bpm): 150   Movement: Present     General:  Alert, oriented and cooperative. Patient is in no acute distress.  Skin: Skin is warm and dry. No rash noted.   Cardiovascular: Normal heart rate noted  Respiratory: Normal respiratory effort, no problems with respiration noted  Abdomen: Soft, gravid, appropriate for gestational age.  Pain/Pressure: Absent     Pelvic: Cervical exam deferred        Extremities: Normal range of motion.     Mental Status: Normal mood and affect. Normal behavior. Normal judgment and thought content.   Assessment and Plan:  Pregnancy: B3H7897 at [redacted]w[redacted]d 1. Chronic hypertension affecting pregnancy BP normal  2. Encounter for supervision of other normal pregnancy in first trimester HG, add Protonix for reflux sx - Babyscripts Schedule Optimization - pantoprazole (PROTONIX) 40 MG tablet; Take 1 tablet (40 mg total) by mouth daily.  Dispense: 30 tablet; Refill: 6  Preterm labor symptoms and general  obstetric precautions including but not limited to vaginal bleeding, contractions, leaking of fluid and fetal movement were reviewed in detail with the patient. Please refer to After Visit Summary for other counseling recommendations.   Return in about 4 weeks (around 06/09/2020).  Future Appointments  Date Time Provider Department Center  06/09/2020 11:00 AM Constant, Gigi Gin, MD [facility] None  06/10/2020 10:00 AM [facility] NURSE [facility]  06/10/2020 10:15 AM [facility]    Scheryl Darter, MD

## 2020-05-12 NOTE — Progress Notes (Signed)
Pt states her nausea is no better with the use of 3 antiemetics.  Pt states she was seen at hospital last week for bleeding, no bleeding since then. Also complains of throat problem due to vomiting.

## 2020-06-09 ENCOUNTER — Other Ambulatory Visit: Payer: Self-pay

## 2020-06-09 ENCOUNTER — Ambulatory Visit (INDEPENDENT_AMBULATORY_CARE_PROVIDER_SITE_OTHER): Payer: No Typology Code available for payment source | Admitting: Obstetrics and Gynecology

## 2020-06-09 ENCOUNTER — Encounter: Payer: Self-pay | Admitting: Obstetrics and Gynecology

## 2020-06-09 VITALS — BP 125/87 | HR 101 | Wt 203.0 lb

## 2020-06-09 DIAGNOSIS — Z3481 Encounter for supervision of other normal pregnancy, first trimester: Secondary | ICD-10-CM

## 2020-06-09 DIAGNOSIS — O10919 Unspecified pre-existing hypertension complicating pregnancy, unspecified trimester: Secondary | ICD-10-CM

## 2020-06-09 DIAGNOSIS — Z98891 History of uterine scar from previous surgery: Secondary | ICD-10-CM

## 2020-06-09 DIAGNOSIS — O99351 Diseases of the nervous system complicating pregnancy, first trimester: Secondary | ICD-10-CM

## 2020-06-09 DIAGNOSIS — G40909 Epilepsy, unspecified, not intractable, without status epilepticus: Secondary | ICD-10-CM

## 2020-06-09 MED ORDER — DOXYLAMINE-PYRIDOXINE 10-10 MG PO TBEC: 2.0000 | DELAYED_RELEASE_TABLET | Freq: Every day | ORAL | 5 refills | Status: DC

## 2020-06-09 NOTE — Progress Notes (Signed)
   PRENATAL VISIT NOTE  Subjective:  Leah Shea is a 35 y.o. [id number] at [redacted]w[redacted]d being seen today for ongoing prenatal care.  She is currently monitored for the following issues for this high-risk pregnancy and has Genetic testing; G6PD deficiency; Encounter for supervision of normal pregnancy in first trimester; History of 3 cesarean sections; Seizure disorder during pregnancy in first trimester [facility]); and Chronic hypertension affecting pregnancy on their problem list.  Patient reports nausea and vomiting.  Contractions: Not present. Vag. Bleeding: None.  Movement: Present. Denies leaking of fluid.   The following portions of the patient's history were reviewed and updated as appropriate: allergies, current medications, past family history, past medical history, past social history, past surgical history and problem list.   Objective:   Vitals:   06/09/20 1045  BP: 125/87  Pulse: (!) 101  Weight: 203 lb (92.1 kg)    Fetal Status: Fetal Heart Rate (bpm): 151   Movement: Present     General:  Alert, oriented and cooperative. Patient is in no acute distress.  Skin: Skin is warm and dry. No rash noted.   Cardiovascular: Normal heart rate noted  Respiratory: Normal respiratory effort, no problems with respiration noted  Abdomen: Soft, gravid, appropriate for gestational age.  Pain/Pressure: Present     Pelvic: Cervical exam deferred        Extremities: Normal range of motion.  Edema: Trace  Mental Status: Normal mood and affect. Normal behavior. Normal judgment and thought content.   Assessment and Plan:  Pregnancy: [id number] at [redacted]w[redacted]d 1. Encounter for supervision of other normal pregnancy in first trimester Patient is doing well with persistent nausea/emesis Rx diclegis provided Encouraged liquid calories- protein shakes, smoothies Anatomy ultrasound tomorrow  2. Chronic hypertension affecting pregnancy Normotensive Continue ASA  3. Seizure disorder during pregnancy in first  trimester (HCC) Continue lamictal Reports mild seizure a few weeks ago  4. History of 3 cesarean sections Will be scheduled for repeat  Preterm labor symptoms and general obstetric precautions including but not limited to vaginal bleeding, contractions, leaking of fluid and fetal movement were reviewed in detail with the patient. Please refer to After Visit Summary for other counseling recommendations.   Return in about 4 weeks (around 07/07/2020) for in person, ROB, High risk.  Future Appointments  Date Time Provider Department Center  06/10/2020 10:00 AM [facility] NURSE [facility]  06/10/2020 10:15 AM [facility] US2 [facility]    Catalina Antigua, MD

## 2020-06-09 NOTE — Progress Notes (Signed)
Pt is still having issues with nausea and vomiting, states even with 3 meds she is vomiting up to 6-7 times per day.

## 2020-06-10 ENCOUNTER — Encounter: Payer: Self-pay | Admitting: *Deleted

## 2020-06-10 ENCOUNTER — Other Ambulatory Visit: Payer: Self-pay | Admitting: *Deleted

## 2020-06-10 ENCOUNTER — Ambulatory Visit: Payer: No Typology Code available for payment source | Admitting: *Deleted

## 2020-06-10 ENCOUNTER — Ambulatory Visit: Payer: No Typology Code available for payment source | Attending: Obstetrics and Gynecology

## 2020-06-10 DIAGNOSIS — Z3481 Encounter for supervision of other normal pregnancy, first trimester: Secondary | ICD-10-CM | POA: Diagnosis not present

## 2020-06-10 DIAGNOSIS — O10919 Unspecified pre-existing hypertension complicating pregnancy, unspecified trimester: Secondary | ICD-10-CM | POA: Diagnosis present

## 2020-06-10 DIAGNOSIS — G40909 Epilepsy, unspecified, not intractable, without status epilepticus: Secondary | ICD-10-CM

## 2020-06-10 IMAGING — US US MFM OB DETAIL+14 WK
1 series · 13 of 28 positions shown · non-contrast
Comparison: none

[Series 1: us mfm ob detail+14 wk · 13 of 102 slices shown]
[im 4/102]
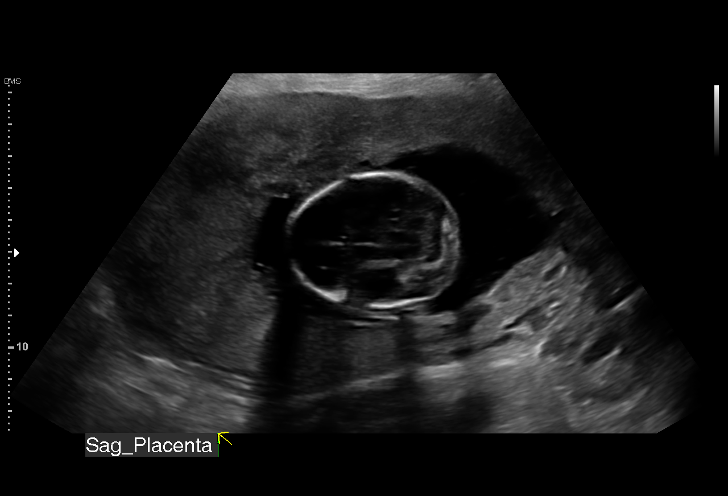
[im 12/102]
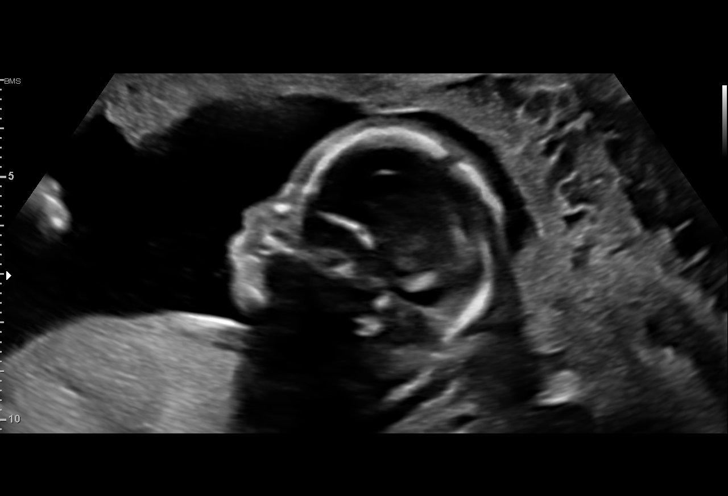
[im 19/102]
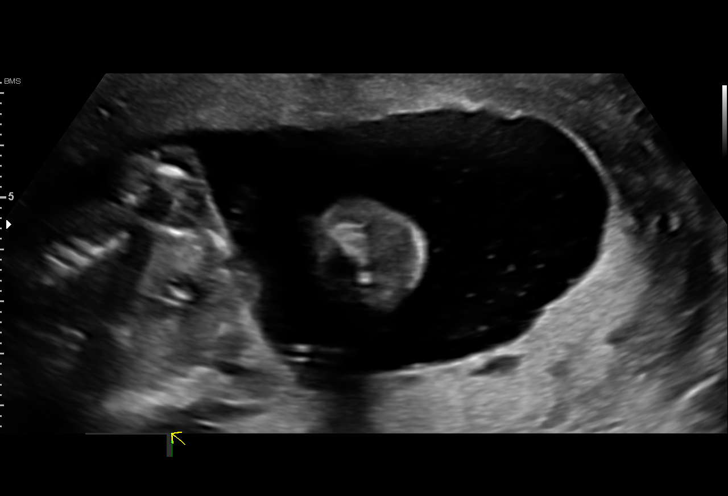
[im 27/102]
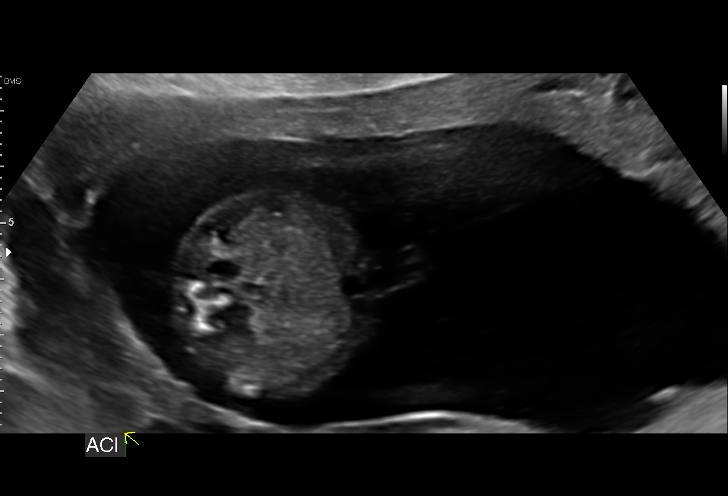
[im 34/102]
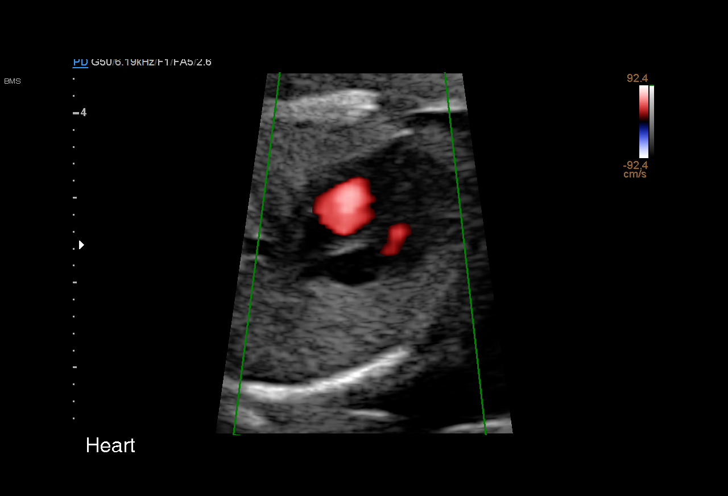
[im 42/102]
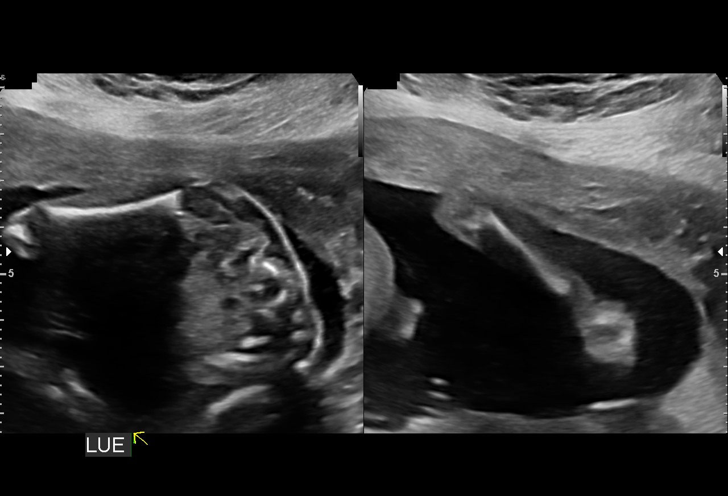
[im 53/102]
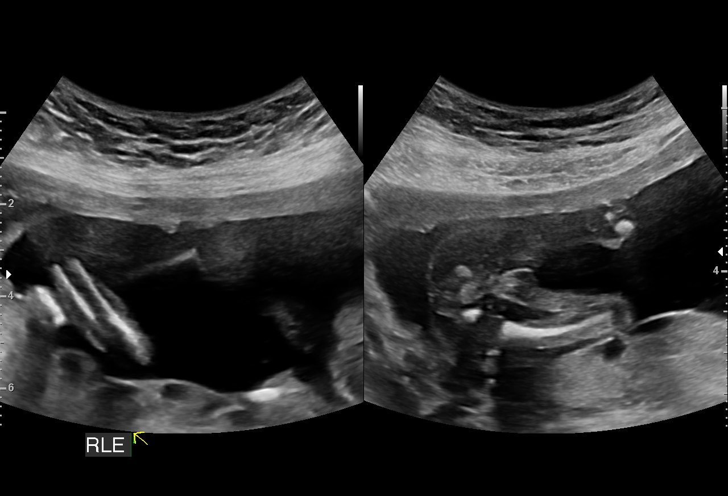
[im 60/102]
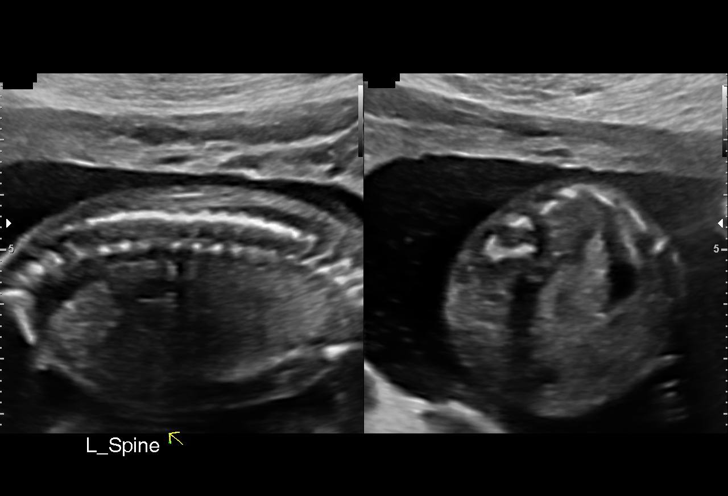
[im 68/102]
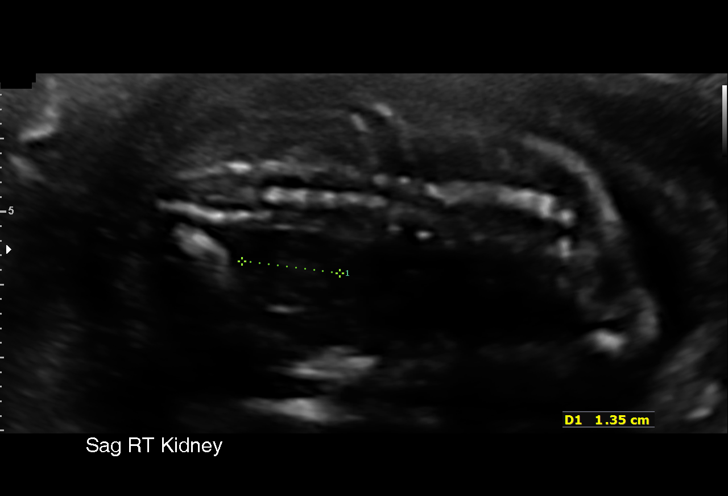
[im 75/102]
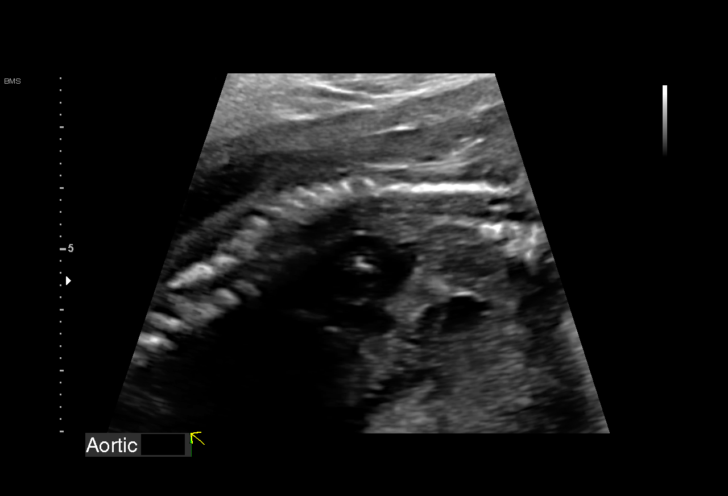
[im 83/102]
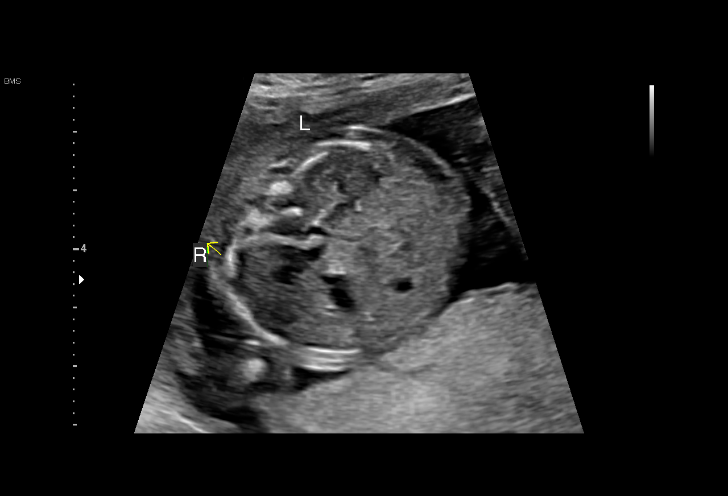
[im 90/102]
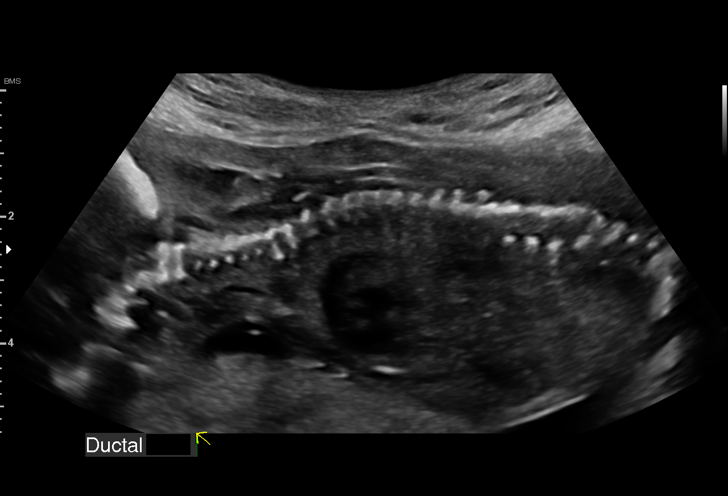
[im 98/102]
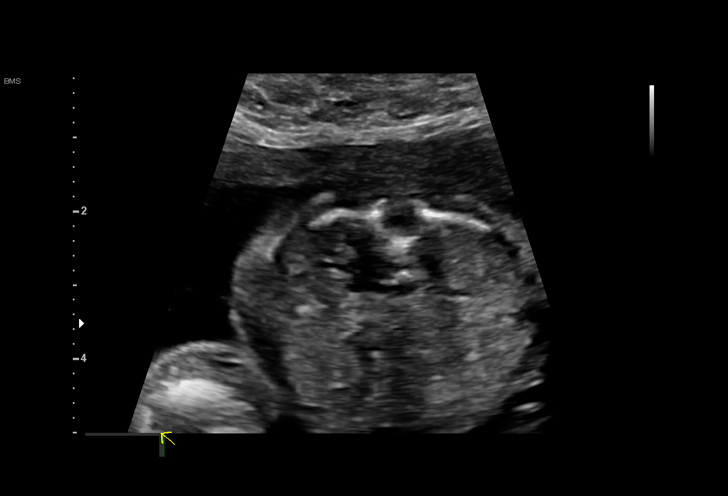

[13 of 28 positions shown; findings below may reference images not displayed]

[REDACTED]care

Indications

 Encounter for antenatal screening for          [27]
 malformations (LR NIPS)
 Obesity complicating pregnancy, second         [27]
 trimester (BMI 32)
 History of cesarean delivery, currently        [27]
 pregnant (x3)
 Genetic carrier (Sickle Cell)                  [27]
 Hypertension - Chronic/Pre-existing (ASA)      [27]
 Medical complication of pregnancy (Seizure     [27]
 disorder-Lamictal)
 Poor obstetric history: Previous fetal growth  [27]
 restriction (FGR)
 18 weeks gestation of pregnancy
Fetal Evaluation

 Num Of Fetuses:         1
 Fetal Heart Rate(bpm):  157
 Cardiac Activity:       Observed
 Presentation:           Variable
 Placenta:               Posterior Fundal
 P. Cord Insertion:      Visualized, central

 Amniotic Fluid
 AFI FV:      Within normal limits

                             Largest Pocket(cm)

Biometry

 BPD:        43  mm     G. Age:  19w 0d         64  %    CI:        76.64   %    70 - 86
                                                         FL/HC:      17.7   %    16.1 -
 HC:      155.6  mm     G. Age:  18w 3d         31  %    HC/AC:      1.13        1.09 -
 AC:      137.6  mm     G. Age:  19w 1d         62  %    FL/BPD:     64.2   %
 FL:       27.6  mm     G. Age:  18w 3d         33  %    FL/AC:      20.1   %    20 - 24
 HUM:      27.2  mm     G. Age:  18w 5d         51  %
 CER:      19.5  mm     G. Age:  19w 0d         52  %
 NFT:       2.8  mm
 LV:        6.8  mm
 CM:        4.2  mm

 Est. FW:     258  gm      0 lb 9 oz     51  %
OB History

 Gravidity:    7
 Living:       4
Gestational Age

 LMP:           19w 0d        Date:  [DATE]                 EDD:   [DATE]
 U/S Today:     18w 5d                                        EDD:   [DATE]
 Best:          18w 5d     Det. By:  U/S C R L  ([DATE])    EDD:   [DATE]
Anatomy

 Cranium:               Appears normal         Aortic Arch:            Appears normal
 Cavum:                 Appears normal         Ductal Arch:            Appears normal
 Ventricles:            Appears normal         Diaphragm:              Appears normal
 Choroid Plexus:        Appears normal         Stomach:                Appears normal, left
                                                                       sided
 Cerebellum:            Appears normal         Abdomen:                Appears normal
 Posterior Fossa:       Appears normal         Abdominal Wall:         Appears nml (cord
                                                                       insert, abd wall)
 Nuchal Fold:           Appears normal         Cord Vessels:           Appears normal (3
                                                                       vessel cord)
 Face:                  Orbits nl; profile not Kidneys:                Appear normal
                        well visualized
 Lips:                  Appears normal         Bladder:                Appears normal
 Thoracic:              Appears normal         Spine:                  Appears normal
 Heart:                 Appears normal         Upper Extremities:      Appears normal
                        (4CH, axis, and
                        situs)
 RVOT:                  Not well visualized    Lower Extremities:      Appears normal
 LVOT:                  Not well visualized

 Other:  Fetus appears to be a male. Hands and feet, heels visualized.
         Technically difficult due to maternal habitus and fetal position.
Cervix Uterus Adnexa

 Cervix
 Length:           5.41  cm.
 Normal appearance by transabdominal scan.
 Uterus
 No abnormality visualized.

 Right Ovary
 Not visualized.

 Left Ovary
 Not visualized.

 Cul De Sac
 No free fluid seen.

 Adnexa
 No adnexal mass visualized.
Impression

 G7 P4.  Patient is here for fetal anatomy scan.  On cell free
 fetal DNA screening, the risks of fetal aneuploidies are not
 increased.
 Patient has seizure disorder (focal epilepsy) and takes
 Lamictal and Keppra.  She reports she still has breakthrough
 seizures.
 She has chronic hypertension that is well controlled without
 antihypertensives.
 She has sickle cell trait.
 Obstetric history significant for a vaginal delivery followed by
 3 term cesarean deliveries.
 We performed a fetal anatomy scan. No markers of
 aneuploidies or fetal structural defects are seen. Fetal
 biometry is consistent with her previously-established dates.
 Amniotic fluid is normal and good fetal activity is seen.
 Patient understands the limitations of ultrasound in detecting
 fetal anomalies.

 Placenta is posterior and there is no evidence of previa or
 placenta accreta spectrum.
 I discussed the importance of regularly taking anticonvulsant
 drugs.  The dosage may have to be increased in pregnancy.
 I encouraged her to make an appointment with a neurologist.
Recommendations

 -An appointment was made for her to return in 4 weeks for
 completion of fetal anatomy.
                 NOMASIBULELE

## 2020-06-11 LAB — AFP, SERUM, OPEN SPINA BIFIDA
AFP MoM: 1.41
AFP Value: 63.4 ng/mL
Gest. Age on Collection Date: 18.6 weeks
Maternal Age At EDD: 34.9 yr
OSBR Risk 1 IN: 7002
Test Results:: NEGATIVE
Weight: 203 [lb_av]

## 2020-07-02 ENCOUNTER — Other Ambulatory Visit: Payer: Self-pay | Admitting: Obstetrics and Gynecology

## 2020-07-02 DIAGNOSIS — O219 Vomiting of pregnancy, unspecified: Secondary | ICD-10-CM

## 2020-07-02 MED ORDER — ONDANSETRON 8 MG PO TBDP: 8.0000 mg | ORAL_TABLET | Freq: Three times a day (TID) | ORAL | 1 refills | Status: DC | PRN

## 2020-07-02 NOTE — Progress Notes (Signed)
Refill request for zofran.  Rx sent  Narmeen Kerper, Harolyn Rutherford, NP 07/02/2020 10:01 PM

## 2020-07-05 ENCOUNTER — Inpatient Hospital Stay (HOSPITAL_COMMUNITY)
Admission: AD | Admit: 2020-07-05 | Discharge: 2020-07-05 | Disposition: A | Discharge status: Home / Self Care | Payer: No Typology Code available for payment source | Attending: Obstetrics and Gynecology | Admitting: Obstetrics and Gynecology

## 2020-07-05 ENCOUNTER — Other Ambulatory Visit: Payer: Self-pay

## 2020-07-05 ENCOUNTER — Encounter (HOSPITAL_COMMUNITY): Payer: Self-pay | Admitting: Obstetrics and Gynecology

## 2020-07-05 DIAGNOSIS — O212 Late vomiting of pregnancy: Secondary | ICD-10-CM | POA: Diagnosis present

## 2020-07-05 DIAGNOSIS — Z87891 Personal history of nicotine dependence: Secondary | ICD-10-CM | POA: Diagnosis not present

## 2020-07-05 DIAGNOSIS — O219 Vomiting of pregnancy, unspecified: Secondary | ICD-10-CM

## 2020-07-05 DIAGNOSIS — Z886 Allergy status to analgesic agent status: Secondary | ICD-10-CM | POA: Insufficient documentation

## 2020-07-05 DIAGNOSIS — Z888 Allergy status to other drugs, medicaments and biological substances status: Secondary | ICD-10-CM | POA: Insufficient documentation

## 2020-07-05 DIAGNOSIS — Z882 Allergy status to sulfonamides status: Secondary | ICD-10-CM | POA: Diagnosis not present

## 2020-07-05 DIAGNOSIS — Z3A22 22 weeks gestation of pregnancy: Secondary | ICD-10-CM

## 2020-07-05 LAB — URINALYSIS, ROUTINE W REFLEX MICROSCOPIC
Bilirubin Urine: NEGATIVE
Glucose, UA: NEGATIVE mg/dL
Hgb urine dipstick: NEGATIVE
Ketones, ur: 5 mg/dL — AB
Leukocytes,Ua: NEGATIVE
Nitrite: NEGATIVE
Protein, ur: NEGATIVE mg/dL
Specific Gravity, Urine: 1.013 (ref 1.005–1.030)
pH: 6 (ref 5.0–8.0)

## 2020-07-05 LAB — BASIC METABOLIC PANEL
Anion gap: 8 (ref 5–15)
BUN: 6 mg/dL (ref 6–20)
CO2: 19 mmol/L — ABNORMAL LOW (ref 22–32)
Calcium: 9.1 mg/dL (ref 8.9–10.3)
Chloride: 109 mmol/L (ref 98–111)
Creatinine, Ser: 0.64 mg/dL (ref 0.44–1.00)
GFR, Estimated: >60 mL/min (ref >60)
Glucose, Bld: 90 mg/dL (ref 70–99)
Potassium: 3.7 mmol/L (ref 3.5–5.1)
Sodium: 136 mmol/L (ref 135–145)

## 2020-07-05 MED ORDER — FAMOTIDINE IN NACL 20-0.9 MG/50ML-% IV SOLN
20.0000 mg | Freq: Once | INTRAVENOUS | Status: AC
  Administered 2020-07-05: 20 mg via INTRAVENOUS
  Filled 2020-07-05: qty 50

## 2020-07-05 MED ORDER — PROMETHAZINE HCL 25 MG PO TABS: 25.0000 mg | ORAL_TABLET | Freq: Four times a day (QID) | ORAL | 0 refills | Status: DC | PRN

## 2020-07-05 MED ORDER — LACTATED RINGERS IV BOLUS
1000.0000 mL | Freq: Once | INTRAVENOUS | Status: AC
  Administered 2020-07-05: 1000 mL via INTRAVENOUS

## 2020-07-05 MED ORDER — SODIUM CHLORIDE 0.9 % IV SOLN
8.0000 mg | Freq: Once | INTRAVENOUS | Status: AC
  Administered 2020-07-05: 8 mg via INTRAVENOUS
  Filled 2020-07-05: qty 4

## 2020-07-05 MED ORDER — SODIUM CHLORIDE 0.9 % IV SOLN
25.0000 mg | Freq: Once | INTRAVENOUS | Status: AC
  Administered 2020-07-05: 25 mg via INTRAVENOUS
  Filled 2020-07-05: qty 1

## 2020-07-05 NOTE — MAU Note (Signed)
Severe nausea and vomiting for the past few days, unable to keep anything down.

## 2020-07-05 NOTE — Discharge Instructions (Signed)
Nausea medication to take during pregnancy:  Unisom (doxylamine succinate 25 mg tablets) Take one tablet daily at bedtime. If symptoms are not adequately controlled, the dose can be increased to a maximum recommended dose of two tablets daily (1/2 tablet in the morning, 1/2 tablet mid-afternoon and one at bedtime). Vitamin B6 100mg  tablets. Take one tablet twice a day (up to 200 mg per day).        Hyperemesis Gravidarum Hyperemesis gravidarum is a severe form of nausea and vomiting that happens during pregnancy. Hyperemesis is worse than morning sickness. It may cause you to have nausea or vomiting all day for many days. It may keep you from eating and drinking enough food and liquids, which can lead to dehydration, malnutrition, and weight loss. Hyperemesis usually occurs during the first half (the first 20 weeks) of pregnancy. It often goes away once a woman is in her second half of pregnancy. However, sometimes hyperemesis continues through an entire pregnancy. What are the causes? The cause of this condition is not known. It may be associated with:  Changes in hormones in the body during pregnancy.  Changes in the gastrointestinal system.  Genetic or inherited conditions. What are the signs or symptoms? Symptoms of this condition include:  Severe nausea and vomiting that does not go away.  Problems keeping food down.  Weight loss.  Loss of body fluid (dehydration).  Loss of appetite. You may have no desire to eat or you may not like the food you have previously enjoyed. How is this diagnosed? This condition may be diagnosed based on your medical history, your symptoms, and a physical exam. You may also have other tests, including:  Blood tests.  Urine tests.  Blood pressure tests.  Ultrasound to look for problems with the placenta or to check if you are pregnant with more than one baby. How is this treated? This condition is managed by controlling symptoms. This may  include:  Following an eating plan. This can help to lessen nausea and vomiting.  Treatments that do not use medicine. These include acupressure bracelets, hypnosis, and eating or drinking foods or fluids that contain ginger, ginger ale, or ginger tea.  Taking prescription medicine or over-the-counter medicine as told by your health care provider.  Continuing to take prenatal vitamins. You may need to change what kind you take and when you take them. Follow your health care provider's instructions about prenatal vitamins. An eating plan and medicines are often used together to help control symptoms. If medicines do not help relieve nausea and vomiting, you may need to receive fluids through an IV at the hospital. Follow these instructions at home: To help relieve your symptoms, listen to your body. Everyone is different and has different preferences. Find what works best for you. Here are some things you can try to help relieve your symptoms: Meals and snacks  Eat 5-6 small meals daily instead of 3 large meals. Eating small meals and snacks can help you avoid an empty stomach.  Before getting out of bed, eat a couple of crackers to avoid moving around on an empty stomach.  Eat a protein-rich snack before bed. Examples include cheese and crackers, or a peanut butter sandwich made with 1 slice of whole-wheat bread and 1 tsp (5 g) of peanut butter.  Eat and drink slowly.  Try eating starchy foods as these are usually tolerated well. Examples include cereal, toast, bread, potatoes, pasta, rice, and pretzels.  Eat at least one serving of protein  with your meals and snacks. Protein options include lean meats, poultry, seafood, beans, nuts, nut butters, eggs, cheese, and yogurt.  Eat or suck on things that have ginger in them. It may help to relieve nausea. Add  tsp (0.44 g) ground ginger to hot tea, or choose ginger tea.   Fluids It is important to stay hydrated. Try to:  Drink small amounts  of fluids often.  Drink fluids 30 minutes before or after a meal to help lessen the feeling of a full stomach.  Drink 100% fruit juice or an electrolyte drink. An electrolyte drink contains sodium, potassium, and chloride.  Drink fluids that are cold, clear, and carbonated or sour. These include lemonade, ginger ale, lemon-lime soda, ice water, and sparkling water. Things to avoid Avoid the following:  Eating foods that trigger your symptoms. These may include spicy foods, coffee, high-fat foods, very sweet foods, and acidic foods.  Drinking more than 1 cup of fluid at a time.  Skipping meals. Nausea can be more intense on an empty stomach. If you cannot tolerate food, do not force it. Try sucking on ice chips or other frozen items and make up for missed calories later.  Lying down within 2 hours after eating.  Being exposed to environmental triggers. These may include food smells, smoky rooms, closed spaces, rooms with strong smells, warm or humid places, overly loud and noisy rooms, and rooms with motion or flickering lights. Try eating meals in a well-ventilated area that is free of strong smells.  Making quick and sudden changes in your movement.  Taking iron pills and multivitamins that contain iron. If you take prescription iron pills, do not stop taking them unless your health care provider approves.  Preparing food. The smell of food can spoil your appetite or trigger nausea. General instructions  Brush your teeth or use a mouth rinse after meals.  Take over-the-counter and prescription medicines only as told by your health care provider.  Follow instructions from your health care provider about eating or drinking restrictions.  Talk with your health care provider about starting a supplement of vitamin B6.  Continue to take your prenatal vitamins as told by your health care provider. If you are having trouble taking your prenatal vitamins, talk with your health care  provider about other options.  Keep all follow-up visits. This is important. Follow-up visits include prenatal visits. Contact a health care provider if:  You have pain in your abdomen.  You have a severe headache.  You have vision problems.  You are losing weight.  You feel weak or dizzy.  You cannot eat or drink without vomiting, especially if this goes on for a full day. Get help right away if:  You cannot drink fluids without vomiting.  You vomit blood.  You have constant nausea and vomiting.  You are very weak.  You faint.  You have a fever and your symptoms suddenly get worse. Summary  Hyperemesis gravidarum is a severe form of nausea and vomiting that happens during pregnancy.  Making some changes to your eating habits may help relieve nausea and vomiting.  This condition may be managed with lifestyle changes and medicines as prescribed by your health care provider.  If medicines do not help relieve nausea and vomiting, you may need to receive fluids through an IV at the hospital. This information is not intended to replace advice given to you by your health care provider. Make sure you discuss any questions you have with your health  care provider. Document Revised: 08/26/2019 Document Reviewed: 08/26/2019 Elsevier Patient Education  2021 [employer].

## 2020-07-05 NOTE — MAU Provider Note (Signed)
History     CSN: [id number]  Arrival date and time: 07/05/20 1405   Event Date/Time   First Provider Initiated Contact with Patient 07/05/20 1502      Chief Complaint  Patient presents with  . Emesis  . Nausea   HPI Leah Shea is a 35 y.o. [id number] at [redacted]w[redacted]d who presents with nausea & vomiting. This has been an ongoing issue with the pregnancy. Reports vomiting 10 times today & unable to keep anything down. States she normally just forces food down knowing that she will vomit it back up.  Took dose of phenergan this morning & vomited 10 minutes later. Reports generalized abdominal soreness that is worse with vomiting.  Denies fever, vaginal bleeding, dysuria, diarrhea, or constipation.  Hx of marijuana use; denies use in over 2 months.   OB History    Gravida  7   Para  4   Term  2   Preterm  2   AB  2   Living  4     SAB  2   IAB      Ectopic      Multiple      Live Births  4           Past Medical History:  Diagnosis Date  . Anemia   . Anxiety   . Asthma   . Blood dyscrasia    sickle trait  . Bronchitis, acute   . Chronic hypertension affecting pregnancy 04/30/2020  . Complex partial seizures (HCC) 2020  . Depression   . Diverticular disease   . Headache(784.0)    migraines  . PTSD (post-traumatic stress disorder)   . Shortness of breath    with exertion or panic attack  . Sleep apnea    awaiting a sleep study to be done at [facility]    Past Surgical History:  Procedure Laterality Date  . BREAST SURGERY Bilateral    reductions  . CESAREAN SECTION  2010, [id number]   x 3  . EYE SURGERY     x 10 as a child  . PILONIDAL CYST EXCISION  2020  . TONSILLECTOMY N/A 10/07/2013   Procedure: TONSILLECTOMY;  Surgeon: Christia Reading, MD;  Location: [facility] OR;  Service: ENT;  Laterality: N/A;  . TUMOR REMOVAL     tumor removed from back    Family History  Problem Relation Age of Onset  . Breast cancer Mother   . Breast cancer Maternal Aunt 36        metastatic  . Cancer Maternal Uncle        unk type  . Breast cancer Maternal Grandmother   . Prostate cancer Maternal Grandfather        metastatic  . Cancer Maternal Aunt        either breast or ovarian     Social History   Tobacco Use  . Smoking status: Former Smoker    Packs/day: 0.25    Years: 1.00    Pack years: 0.25    Types: Cigarettes    Quit date: 07/02/2013    Years since quitting: 7.0  . Smokeless tobacco: Never Used  Vaping Use  . Vaping Use: Never used  Substance Use Topics  . Alcohol use: Not Currently  . Drug use: Not Currently    Types: Marijuana    Comment: last used January 3    Allergies:  Allergies  Allergen Reactions  . Other Anaphylaxis and Hives    Mushrooms  . Nsaids Hives,  Swelling and Other (See Comments)    Body burns   . Reglan [Metoclopramide] Other (See Comments)    anxiety  . Sulfa Antibiotics Other (See Comments)    burns    No medications prior to admission.    Review of Systems  Constitutional: Negative.   Gastrointestinal: Positive for abdominal pain, nausea and vomiting. Negative for constipation and diarrhea.  Genitourinary: Negative.    Physical Exam   Blood pressure 124/72, pulse 95, temperature 98.6 F (37 C), temperature source Oral, resp. rate 16, weight 90.3 kg, last menstrual period 01/29/2020.  Physical Exam Vitals and nursing note reviewed.  Constitutional:      Appearance: Normal appearance. She is not toxic-appearing.  HENT:     Head: Normocephalic and atraumatic.  Eyes:     General: No scleral icterus. Pulmonary:     Effort: Pulmonary effort is normal. No respiratory distress.  Abdominal:     Palpations: Abdomen is soft.     Tenderness: There is no abdominal tenderness.  Genitourinary:    Comments: Cervix closed/thick Skin:    General: Skin is warm and dry.  Neurological:     Mental Status: She is alert.  Psychiatric:        Mood and Affect: Mood normal.        Behavior: Behavior normal.      MAU Course  Procedures Results for orders placed or performed during the hospital encounter of 07/05/20 (from the past 24 hour(s))  Urinalysis, Routine w reflex microscopic Urine, Clean Catch     Status: Abnormal   Collection Time: 07/05/20  3:15 PM  Result Value Ref Range   Color, Urine YELLOW YELLOW   APPearance HAZY (A) CLEAR   Specific Gravity, Urine 1.013 1.005 - 1.030   pH 6.0 5.0 - 8.0   Glucose, UA NEGATIVE NEGATIVE mg/dL   Hgb urine dipstick NEGATIVE NEGATIVE   Bilirubin Urine NEGATIVE NEGATIVE   Ketones, ur 5 (A) NEGATIVE mg/dL   Protein, ur NEGATIVE NEGATIVE mg/dL   Nitrite NEGATIVE NEGATIVE   Leukocytes,Ua NEGATIVE NEGATIVE  Basic metabolic panel     Status: Abnormal   Collection Time: 07/05/20  3:53 PM  Result Value Ref Range   Sodium 136 135 - 145 mmol/L   Potassium 3.7 3.5 - 5.1 mmol/L   Chloride 109 98 - 111 mmol/L   CO2 19 (L) 22 - 32 mmol/L   Glucose, Bld 90 70 - 99 mg/dL   BUN 6 6 - 20 mg/dL   Creatinine, Ser 7.62 0.44 - 1.00 mg/dL   Calcium 9.1 8.9 - 83.1 mg/dL   GFR, Estimated >51 >76 mL/min   Anion gap 8 5 - 15    MDM FHR present via doppler  Patient presents with persistent nausea & vomiting. Unable to keep down her at home meds today. Given IV fluid bolus x 2 liters, phenergan 25 mg, pepcid 20 mg, & zofran 8 mg. Patient not observed vomiting in MAU. Weight, vitals, & labs stable. Discussed at home medication regimen. Will refill phenergan & start on protonix. Encouraged to use phenergan vaginally on a schedule until symptoms under better control.   Assessment and Plan   1. Nausea and vomiting during pregnancy   2. [redacted] weeks gestation of pregnancy    -refilled phenergan -went to prescribe protonix - recently prescribed to her with 6 refills - informed patient & encouraged to pick up prescription & start taking -reviewed reasons to return to MAU  Judeth Horn 07/05/2020, 7:28 PM

## 2020-07-08 ENCOUNTER — Ambulatory Visit: Payer: No Typology Code available for payment source

## 2020-07-08 ENCOUNTER — Other Ambulatory Visit: Payer: Self-pay

## 2020-07-08 ENCOUNTER — Encounter: Payer: Self-pay | Admitting: Obstetrics and Gynecology

## 2020-07-08 ENCOUNTER — Ambulatory Visit (INDEPENDENT_AMBULATORY_CARE_PROVIDER_SITE_OTHER): Payer: No Typology Code available for payment source | Admitting: Obstetrics and Gynecology

## 2020-07-08 VITALS — BP 115/76 | HR 85 | Wt 209.0 lb

## 2020-07-08 DIAGNOSIS — G40909 Epilepsy, unspecified, not intractable, without status epilepticus: Secondary | ICD-10-CM

## 2020-07-08 DIAGNOSIS — Z3481 Encounter for supervision of other normal pregnancy, first trimester: Secondary | ICD-10-CM

## 2020-07-08 DIAGNOSIS — O10919 Unspecified pre-existing hypertension complicating pregnancy, unspecified trimester: Secondary | ICD-10-CM

## 2020-07-08 DIAGNOSIS — O99351 Diseases of the nervous system complicating pregnancy, first trimester: Secondary | ICD-10-CM

## 2020-07-08 DIAGNOSIS — Z98891 History of uterine scar from previous surgery: Secondary | ICD-10-CM

## 2020-07-08 NOTE — Progress Notes (Signed)
Pt needs to discuss N&V, still having issues with medication use. Was advised to discuss outpatient infusions. Pt complains of lower pelvic/ lower right side pain.

## 2020-07-08 NOTE — Patient Instructions (Signed)

## 2020-07-08 NOTE — Progress Notes (Signed)
Subjective:  Leah Shea is a 35 y.o. [id number] at [redacted]w[redacted]d being seen today for ongoing prenatal care.  She is currently monitored for the following issues for this high-risk pregnancy and has Genetic testing; G6PD deficiency; Encounter for supervision of normal pregnancy in first trimester; History of 3 cesarean sections; Seizure disorder during pregnancy in first trimester [facility]); and Chronic hypertension affecting pregnancy on their problem list.  Patient reports N/V.Marland Kitchen  Contractions: Not present. Vag. Bleeding: None.  Movement: Present. Denies leaking of fluid.   The following portions of the patient's history were reviewed and updated as appropriate: allergies, current medications, past family history, past medical history, past social history, past surgical history and problem list. Problem list updated.  Objective:   Vitals:   07/08/20 1127  BP: 115/76  Pulse: 85  Weight: 94.8 kg    Fetal Status: Fetal Heart Rate (bpm): 150   Movement: Present     General:  Alert, oriented and cooperative. Patient is in no acute distress.  Skin: Skin is warm and dry. No rash noted.   Cardiovascular: Normal heart rate noted  Respiratory: Normal respiratory effort, no problems with respiration noted  Abdomen: Soft, gravid, appropriate for gestational age. Pain/Pressure: Present     Pelvic:  Cervical exam deferred        Extremities: Normal range of motion.     Mental Status: Normal mood and affect. Normal behavior. Normal judgment and thought content.   Urinalysis:      Assessment and Plan:  Pregnancy: [id number] at [redacted]w[redacted]d  1. Encounter for supervision of other normal pregnancy in first trimester Stable Still problems with N/V but has weight gain. Awaiting for approval of Diclegis  Discussed  2. Chronic hypertension affecting pregnancy BP stable   3. Seizure disorder during pregnancy in first trimester (HCC) Stable Continue with Lamictal. Followed by Neurology at [facility]  4. History of 3  cesarean sections Will need repeat at 39 weeks Considering BTL  Preterm labor symptoms and general obstetric precautions including but not limited to vaginal bleeding, contractions, leaking of fluid and fetal movement were reviewed in detail with the patient. Please refer to After Visit Summary for other counseling recommendations.  Return in about 4 weeks (around 08/05/2020) for OB visit, face to face, MD only, fasting for Glucola.   Hermina Staggers, MD

## 2020-07-10 ENCOUNTER — Inpatient Hospital Stay (HOSPITAL_COMMUNITY): Payer: No Typology Code available for payment source

## 2020-07-10 ENCOUNTER — Encounter (HOSPITAL_COMMUNITY): Payer: Self-pay | Admitting: Obstetrics and Gynecology

## 2020-07-10 ENCOUNTER — Inpatient Hospital Stay (HOSPITAL_COMMUNITY)
Admission: AD | Admit: 2020-07-10 | Discharge: 2020-07-11 | Disposition: A | Discharge status: Home / Self Care | Payer: No Typology Code available for payment source | Attending: Family Medicine | Admitting: Family Medicine

## 2020-07-10 ENCOUNTER — Other Ambulatory Visit: Payer: Self-pay

## 2020-07-10 DIAGNOSIS — O99352 Diseases of the nervous system complicating pregnancy, second trimester: Secondary | ICD-10-CM | POA: Insufficient documentation

## 2020-07-10 DIAGNOSIS — G40109 Localization-related (focal) (partial) symptomatic epilepsy and epileptic syndromes with simple partial seizures, not intractable, without status epilepticus: Secondary | ICD-10-CM

## 2020-07-10 DIAGNOSIS — Z3A23 23 weeks gestation of pregnancy: Secondary | ICD-10-CM | POA: Insufficient documentation

## 2020-07-10 DIAGNOSIS — Z87891 Personal history of nicotine dependence: Secondary | ICD-10-CM | POA: Diagnosis not present

## 2020-07-10 DIAGNOSIS — J312 Chronic pharyngitis: Secondary | ICD-10-CM

## 2020-07-10 DIAGNOSIS — Z8616 Personal history of COVID-19: Secondary | ICD-10-CM | POA: Diagnosis not present

## 2020-07-10 DIAGNOSIS — R531 Weakness: Secondary | ICD-10-CM | POA: Diagnosis not present

## 2020-07-10 DIAGNOSIS — O10919 Unspecified pre-existing hypertension complicating pregnancy, unspecified trimester: Secondary | ICD-10-CM

## 2020-07-10 DIAGNOSIS — O219 Vomiting of pregnancy, unspecified: Secondary | ICD-10-CM | POA: Diagnosis not present

## 2020-07-10 DIAGNOSIS — O26892 Other specified pregnancy related conditions, second trimester: Secondary | ICD-10-CM | POA: Insufficient documentation

## 2020-07-10 DIAGNOSIS — O212 Late vomiting of pregnancy: Secondary | ICD-10-CM | POA: Diagnosis present

## 2020-07-10 DIAGNOSIS — R079 Chest pain, unspecified: Secondary | ICD-10-CM | POA: Diagnosis not present

## 2020-07-10 DIAGNOSIS — G43109 Migraine with aura, not intractable, without status migrainosus: Secondary | ICD-10-CM | POA: Diagnosis not present

## 2020-07-10 DIAGNOSIS — R200 Anesthesia of skin: Secondary | ICD-10-CM | POA: Insufficient documentation

## 2020-07-10 DIAGNOSIS — G43809 Other migraine, not intractable, without status migrainosus: Secondary | ICD-10-CM | POA: Diagnosis present

## 2020-07-10 DIAGNOSIS — Z3481 Encounter for supervision of other normal pregnancy, first trimester: Secondary | ICD-10-CM

## 2020-07-10 HISTORY — DX: Glucose-6-phosphate dehydrogenase (G6PD) deficiency without anemia: D75.A

## 2020-07-10 LAB — URINALYSIS, ROUTINE W REFLEX MICROSCOPIC
Bilirubin Urine: NEGATIVE
Glucose, UA: NEGATIVE mg/dL
Hgb urine dipstick: NEGATIVE
Ketones, ur: NEGATIVE mg/dL
Leukocytes,Ua: NEGATIVE
Nitrite: NEGATIVE
Protein, ur: NEGATIVE mg/dL
Specific Gravity, Urine: 1.012 (ref 1.005–1.030)
pH: 7 (ref 5.0–8.0)

## 2020-07-10 LAB — BASIC METABOLIC PANEL
Anion gap: 7 (ref 5–15)
BUN: 5 mg/dL — ABNORMAL LOW (ref 6–20)
CO2: 21 mmol/L — ABNORMAL LOW (ref 22–32)
Calcium: 9 mg/dL (ref 8.9–10.3)
Chloride: 108 mmol/L (ref 98–111)
Creatinine, Ser: 0.66 mg/dL (ref 0.44–1.00)
GFR, Estimated: >60 mL/min (ref >60)
Glucose, Bld: 74 mg/dL (ref 70–99)
Potassium: 3.5 mmol/L (ref 3.5–5.1)
Sodium: 136 mmol/L (ref 135–145)

## 2020-07-10 LAB — CBC
HCT: 28.7 % — ABNORMAL LOW (ref 36.0–46.0)
Hemoglobin: 10.1 g/dL — ABNORMAL LOW (ref 12.0–15.0)
MCH: 31.3 pg (ref 26.0–34.0)
MCHC: 35.2 g/dL (ref 30.0–36.0)
MCV: 88.9 fL (ref 80.0–100.0)
Platelets: 202 10*3/uL (ref 150–400)
RBC: 3.23 MIL/uL — ABNORMAL LOW (ref 3.87–5.11)
RDW: 12.5 % (ref 11.5–15.5)
WBC: 8.6 10*3/uL (ref 4.0–10.5)
nRBC: 0 % (ref 0.0–0.2)

## 2020-07-10 LAB — GROUP A STREP BY PCR: Group A Strep by PCR: NOT DETECTED

## 2020-07-10 IMAGING — MR MR HEAD W/O CM
5 series · 48 of 48 positions shown · non-contrast
Comparison: None available.

CLINICAL DATA: Initial evaluation for neuro deficit, stroke
suspected.

EXAM:
MRI HEAD WITHOUT CONTRAST
TECHNIQUE: Multiplanar, multiecho pulse sequences of the brain and surrounding
structures were obtained without intravenous contrast.

[Series 5: DWI · axial · 3.0mm · 0.88mm/px · z∈[-84,+57]mm · 18 of 96 slices shown (1 of 4)]
[im 1/96]
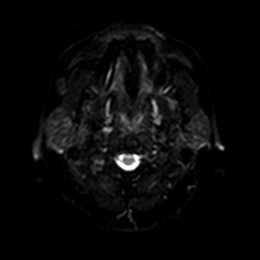
[im 6/96]
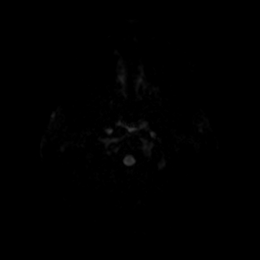
[im 12/96]
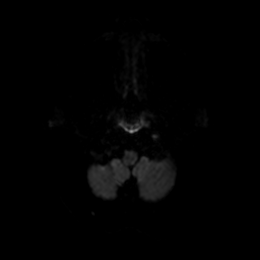
[im 17/96]
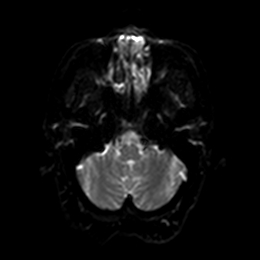
[im 23/96]
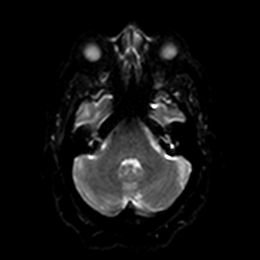
[im 28/96]
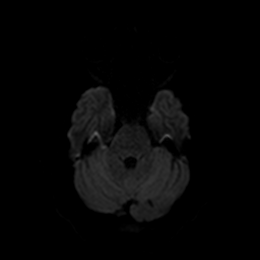
[im 34/96]
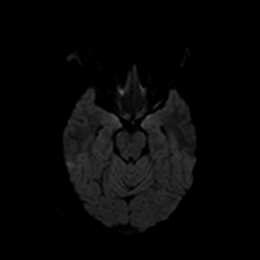
[im 40/96]
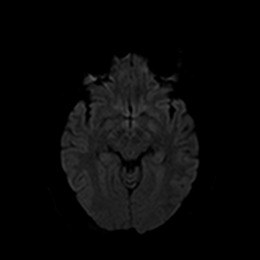
[im 45/96]
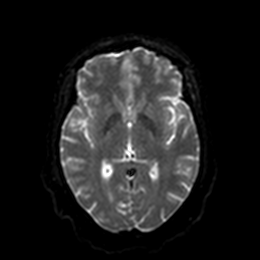
[im 51/96]
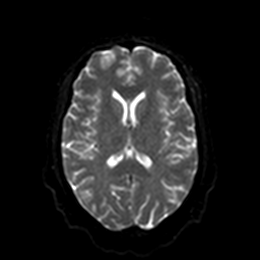
[im 56/96]
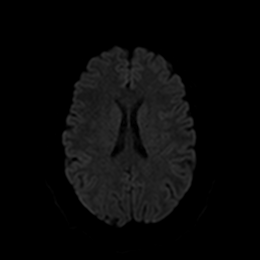
[im 62/96]
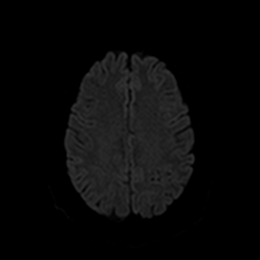
[im 68/96]
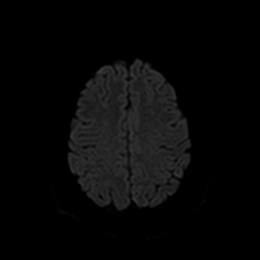
[im 73/96]
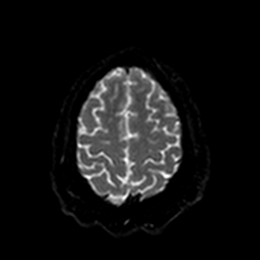
[im 79/96]
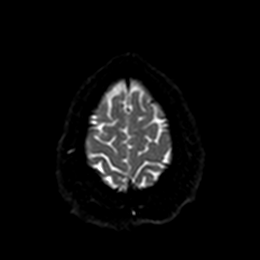
[im 84/96]
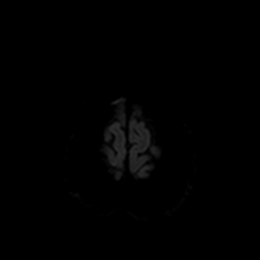
[im 90/96]
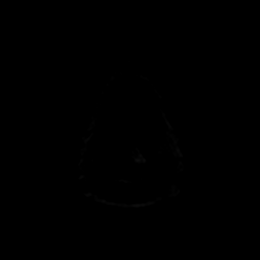
[im 96/96]
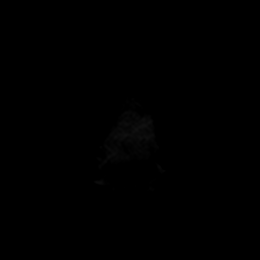

[Series 6: DWI · axial · 3.0mm · 0.88mm/px · z∈[-84,+57]mm · 8 of 48 slices shown (2 of 4)]
[im 1/48]
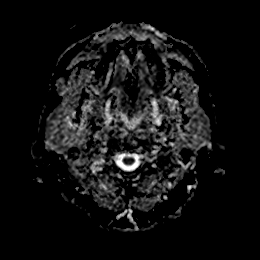
[im 7/48]
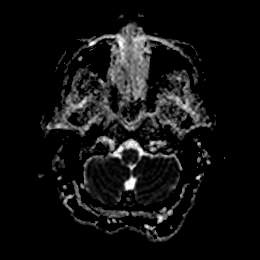
[im 14/48]
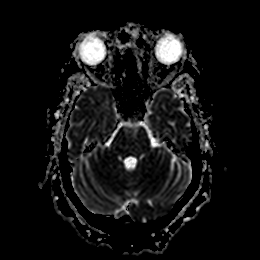
[im 21/48]
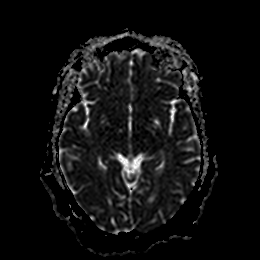
[im 27/48]
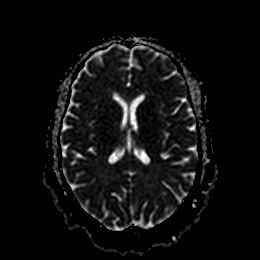
[im 34/48]
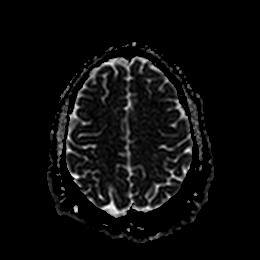
[im 41/48]
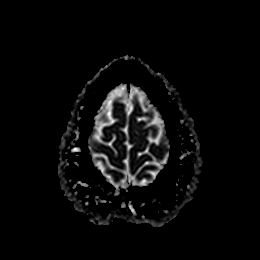
[im 48/48]
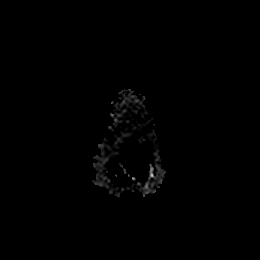

[Series 7: DWI · coronal · 4.0mm · 0.88mm/px · 12 of 72 slices shown (3 of 4)]
[im 1/72]
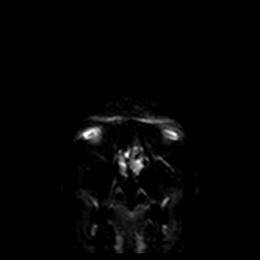
[im 7/72]
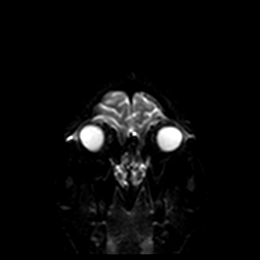
[im 13/72]
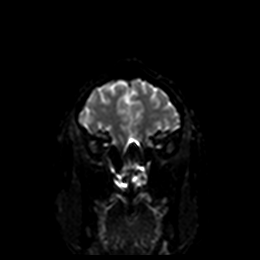
[im 20/72]
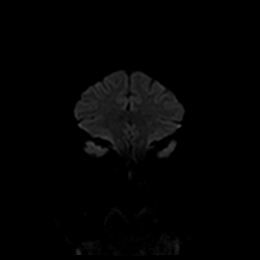
[im 26/72]
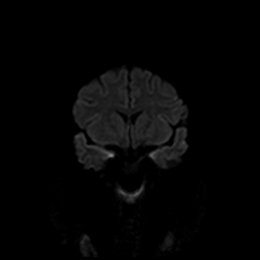
[im 33/72]
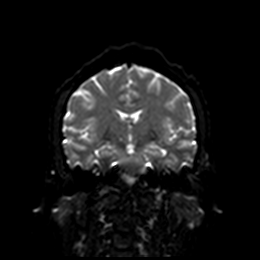
[im 39/72]
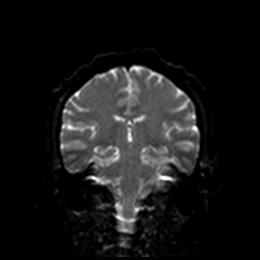
[im 46/72]
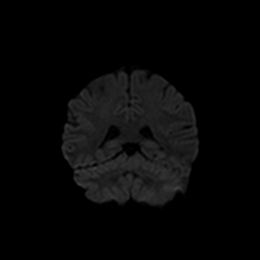
[im 52/72]
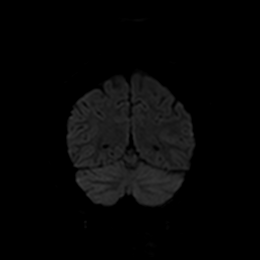
[im 59/72]
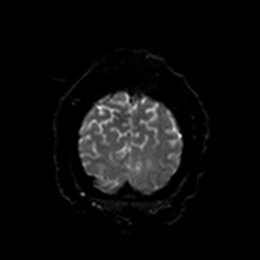
[im 65/72]
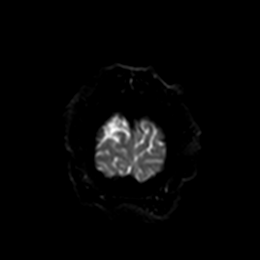
[im 72/72]
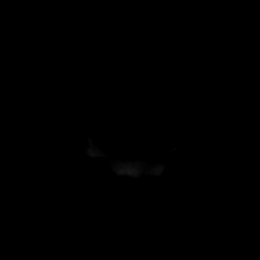

[Series 8: DWI · coronal · 4.0mm · 0.88mm/px · 6 of 36 slices shown (4 of 4)]
[im 1/36]
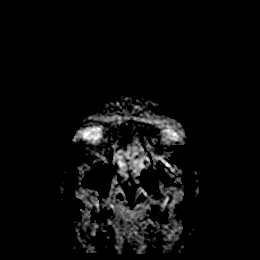
[im 8/36]
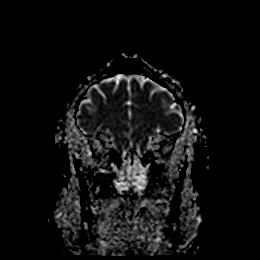
[im 15/36]
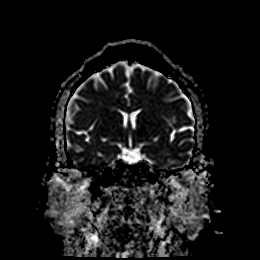
[im 22/36]
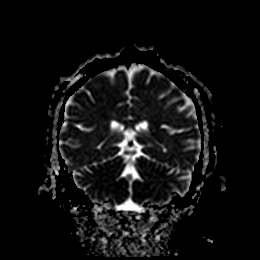
[im 29/36]
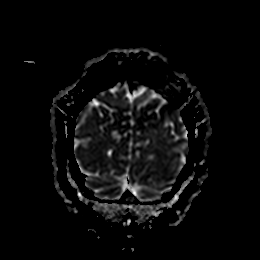
[im 36/36]
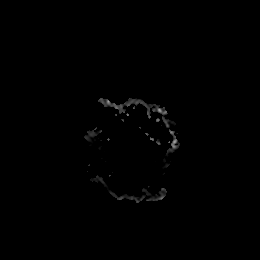

[Series 9: FLAIR · axial · 5.0mm · 0.45mm/px · z∈[-86,+58]mm · 4 of 25 slices shown]
[im 1/25]
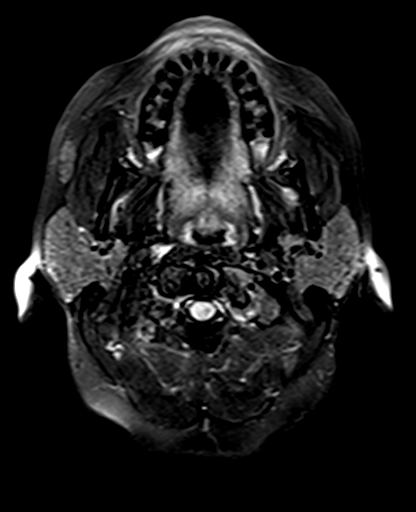
[im 9/25]
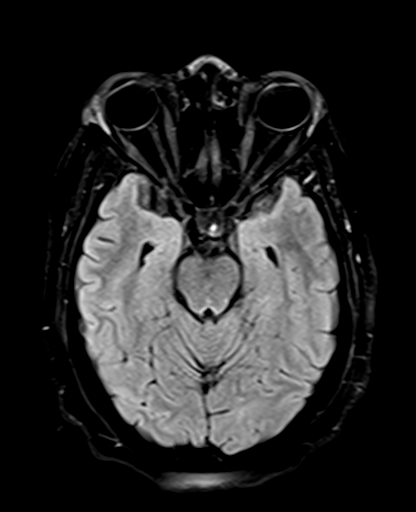
[im 17/25]
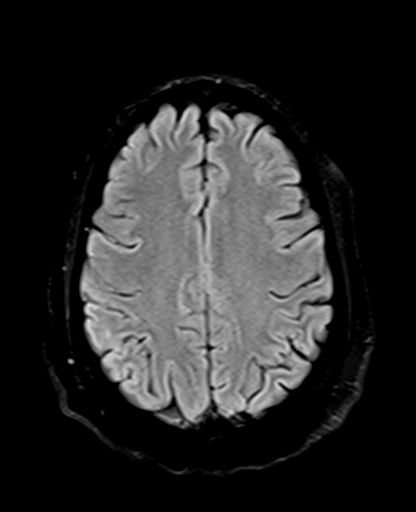
[im 25/25]
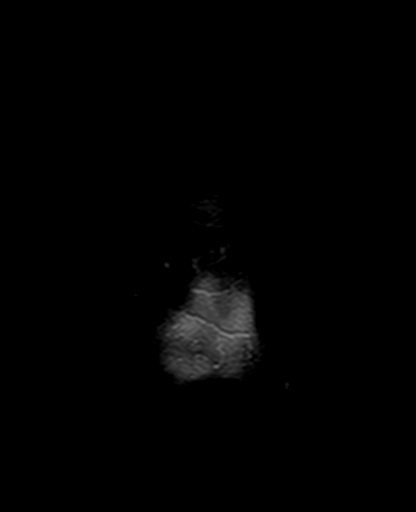

[48 of 48 positions shown; findings below may reference images not displayed]

FINDINGS: Brain: Examination is limited with only axial coronal DWI sequence
with axial FLAIR sequence performed.

Cerebral volume within normal limits. No focal parenchymal signal
abnormality or significant cerebral white matter disease seen on
FLAIR sequence. No abnormal foci of restricted diffusion to suggest
acute or subacute ischemia. Gray-white matter differentiation
maintained. No areas of remote infarction visualized. No mass
lesion, midline shift or mass effect. No hydrocephalus or
extra-axial fluid collection.

Vascular: Major intracranial vascular flow voids are grossly
maintained at the skull base.

Skull and upper cervical spine: Scalp soft tissues and calvarium
within normal limits.

Sinuses/Orbits: Globes and orbital soft tissues within normal
limits. Paranasal sinuses and mastoid air cells are largely clear.

Other: None.
IMPRESSION: Negative limited MRI of the brain. No acute intracranial infarct or
other abnormality.

## 2020-07-10 MED ORDER — LORAZEPAM 2 MG/ML IJ SOLN: 2.0000 mg | Freq: Once | INTRAMUSCULAR | Status: AC | PRN

## 2020-07-10 MED ORDER — DIPHENHYDRAMINE HCL 50 MG/ML IJ SOLN
25.0000 mg | Freq: Once | INTRAMUSCULAR | Status: AC
  Administered 2020-07-10: 25 mg via INTRAVENOUS
  Filled 2020-07-10: qty 1

## 2020-07-10 MED ORDER — CYCLOBENZAPRINE HCL 5 MG PO TABS
10.0000 mg | ORAL_TABLET | Freq: Three times a day (TID) | ORAL | Status: DC | PRN
  Administered 2020-07-10: 10 mg via ORAL
  Filled 2020-07-10: qty 2

## 2020-07-10 MED ORDER — SODIUM CHLORIDE 0.9 % IV SOLN
2000.0000 mg | INTRAVENOUS | Status: DC
  Filled 2020-07-10: qty 20

## 2020-07-10 MED ORDER — MAGNESIUM SULFATE 2 GM/50ML IV SOLN
2.0000 g | Freq: Once | INTRAVENOUS | Status: AC
  Administered 2020-07-11: 2 g via INTRAVENOUS
  Filled 2020-07-10: qty 50

## 2020-07-10 MED ORDER — SODIUM CHLORIDE 0.9 % IV BOLUS
1000.0000 mL | Freq: Once | INTRAVENOUS | Status: AC
  Administered 2020-07-10: 1000 mL via INTRAVENOUS

## 2020-07-10 MED ORDER — MELATONIN 3 MG PO TABS
3.0000 mg | ORAL_TABLET | Freq: Every day | ORAL | Status: DC
  Administered 2020-07-10: 3 mg via ORAL
  Filled 2020-07-10: qty 1

## 2020-07-10 MED ORDER — LACTATED RINGERS IV BOLUS
1000.0000 mL | Freq: Once | INTRAVENOUS | Status: AC
  Administered 2020-07-10: 1000 mL via INTRAVENOUS

## 2020-07-10 MED ORDER — LEVETIRACETAM IN NACL 1000 MG/100ML IV SOLN
1000.0000 mg | INTRAVENOUS | Status: AC
Start: 2020-07-10 — End: 2020-07-10
  Administered 2020-07-10 (×2): 1000 mg via INTRAVENOUS
  Filled 2020-07-10 (×2): qty 100

## 2020-07-10 MED ORDER — HYDROCODONE-ACETAMINOPHEN 5-325 MG PO TABS
2.0000 | ORAL_TABLET | Freq: Once | ORAL | Status: AC
Start: 2020-07-11 — End: 2020-07-11
  Administered 2020-07-11: 2 via ORAL
  Filled 2020-07-10: qty 2

## 2020-07-10 MED ORDER — FAMOTIDINE IN NACL 20-0.9 MG/50ML-% IV SOLN
20.0000 mg | Freq: Once | INTRAVENOUS | Status: AC
  Administered 2020-07-10: 20 mg via INTRAVENOUS
  Filled 2020-07-10: qty 50

## 2020-07-10 MED ORDER — ONDANSETRON HCL 4 MG/2ML IJ SOLN
4.0000 mg | Freq: Once | INTRAMUSCULAR | Status: AC
  Administered 2020-07-10: 4 mg via INTRAVENOUS
  Filled 2020-07-10: qty 2

## 2020-07-10 MED ORDER — DEXAMETHASONE SODIUM PHOSPHATE 10 MG/ML IJ SOLN
10.0000 mg | Freq: Once | INTRAMUSCULAR | Status: AC
  Administered 2020-07-10: 10 mg via INTRAVENOUS
  Filled 2020-07-10: qty 1

## 2020-07-10 MED ORDER — LORAZEPAM 2 MG/ML IJ SOLN
INTRAMUSCULAR | Status: AC
  Administered 2020-07-10: 2 mg via INTRAVENOUS
  Filled 2020-07-10: qty 1

## 2020-07-10 MED ORDER — LACTATED RINGERS IV SOLN: INTRAVENOUS | Status: DC

## 2020-07-10 MED ORDER — MAGNESIUM OXIDE -MG SUPPLEMENT 400 (240 MG) MG PO TABS
400.0000 mg | ORAL_TABLET | Freq: Once | ORAL | Status: AC
  Administered 2020-07-10: 400 mg via ORAL
  Filled 2020-07-10: qty 1

## 2020-07-10 NOTE — MAU Note (Signed)
RN went to bedside to reassess pt's pain. Pt states that she is having a seizure, but does not know if it is coming or going. When RN asked patient if there was anything she could do for the patient pt responded that she would like RN to plug her phone charger in the wall. RN Horticulturist, commercial in and asked patient if she would like the lights on or off on the way out of the room. Pt wanted to the lights off. Donette Larry, CNM made aware.

## 2020-07-10 NOTE — Consult Note (Addendum)
NEUROLOGY CONSULTATION NOTE   Date of service: Jul 10, 2020 Patient Name: Leah Shea MRN:  [id number] DOB:  [birth year] Reason for consult: "Stroke code" Requesting Provider: Donnamae Jude, MD _ _ _   _ __   _ __ _ _  __ __   _ __   __ _  History of Present Illness  Leah Shea is a 35 y.o. female who is [redacted] weeks pregnant with PMH significant for bronchitis, HTN, speel apnea, complex partial seizures, migraine headaches who presents with nauseae, vomiting, headache and 3 of her typical simple partial seizures over the last 2 days.  Takes Lamictal at home but has been unable to keep any down during the entire pregnancy. Reports, she has probably only been able to keep down 6 or 7 pills during her entire pregnancy. Reports that over the last 2 days, she has had 3 simple partial seizures and a headache. Headache is typically triggered by her seizures.  Describes the seizure as black spots in her vision ->tire burning smell or gutter smell-> stares off and left side goes numb or tingly. She is aware during the entire seizure. Usually family will have her sit down or she will try to sit down at the onset of the aura.  Reports that her seizure triggers a headache. She has had left sided numbness with the seizures in the past and they can last anywhere from 3 mins to a couple days. Today, she had a simple partial seizure at home, another one in the ED that was very subtle. She was fine after the seizure. Around 2050, she had sudden onset left sided numbness and tingling with Left leg weakness and difficulty gripping things with her left hand. Very unusual for to get weakness.  On my arrival, patient noted to be crying and teary, endorses feeling stressed and overwhelmed and reports headache. Feels that her left arm and leg is more tingly and hyperasthesia. Left face is numb and mild weakness in left arm and left leg.  MRS: 0 TPA/Thrombectomy: Not offered, symptoms too mild, MRI brain  negative for an acute stroke.  NIHSS components Score: Comment  1a Level of Conscious 0[x]  1[]  2[]  3[]      1b LOC Questions 0[x]  1[]  2[]       1c LOC Commands 0[x]  1[]  2[]       2 Best Gaze 0[x]  1[]  2[]       3 Visual 0[x]  1[]  2[]  3[]      4 Facial Palsy 0[x]  1[]  2[]  3[]      5a Motor Arm - left 0[x]  1[]  2[]  3[]  4[]  UN[]    5b Motor Arm - Right 0[x]  1[]  2[]  3[]  4[]  UN[]    6a Motor Leg - Left 0[x]  1[]  2[]  3[]  4[]  UN[]    6b Motor Leg - Right 0[x]  1[]  2[]  3[]  4[]  UN[]    7 Limb Ataxia 0[x]  1[]  2[]  3[]  UN[]     8 Sensory 0[]  1[x]  2[]  UN[]      9 Best Language 0[x]  1[]  2[]  3[]      10 Dysarthria 0[x]  1[]  2[]  UN[]      11 Extinct. and Inattention 0[x]  1[]  2[]       TOTAL: 1      ROS   Constitutional Denies weight loss, fever and chills.  HEENT Denies changes in vision and hearing.  Respiratory Denies SOB and cough.  CV Denies palpitations and CP  GI Denies abdominal pain, nausea, vomiting and diarrhea.  GU Denies dysuria and urinary frequency.  MSK Denies myalgia  and joint pain.  Skin Denies rash and pruritus.  Neurological Endorses headache but no syncope.   Psychiatric Endorses recent changes in mood. Endorses anxiety and depression. No SI.   Past History   Past Medical History:  Diagnosis Date  . Anemia   . Anxiety   . Asthma   . Blood dyscrasia    sickle trait  . Bronchitis, acute   . Chronic hypertension affecting pregnancy 04/30/2020  . Complex partial seizures ([place]) 2020  . Depression   . Diverticular disease   . G6PD deficiency   . Headache(784.0)    migraines  . PTSD (post-traumatic stress disorder)   . Shortness of breath    with exertion or panic attack  . Sleep apnea    awaiting a sleep study to be done at [facility]   Past Surgical History:  Procedure Laterality Date  . BREAST SURGERY Bilateral    reductions  . CESAREAN SECTION  2010, [id number]   x 3  . EYE SURGERY     x 10 as a child  . PILONIDAL CYST EXCISION  2020  . TONSILLECTOMY N/A 10/07/2013   Procedure:  TONSILLECTOMY;  Surgeon: Melida Quitter, MD;  Location: [facility] OR;  Service: ENT;  Laterality: N/A;  . TUMOR REMOVAL     tumor removed from back   Family History  Problem Relation Age of Onset  . Breast cancer Mother   . Breast cancer Maternal Aunt 36       metastatic  . Cancer Maternal Uncle        unk type  . Breast cancer Maternal Grandmother   . Prostate cancer Maternal Grandfather        metastatic  . Cancer Maternal Aunt        either breast or ovarian    Social History   Socioeconomic History  . Marital status: Married    Spouse name: Not on file  . Number of children: Not on file  . Years of education: Not on file  . Highest education level: Not on file  Occupational History  . Not on file  Tobacco Use  . Smoking status: Former Smoker    Packs/day: 0.25    Years: 1.00    Pack years: 0.25    Types: Cigarettes    Quit date: 07/02/2013    Years since quitting: 7.0  . Smokeless tobacco: Never Used  Vaping Use  . Vaping Use: Never used  Substance and Sexual Activity  . Alcohol use: Not Currently  . Drug use: Not Currently    Types: Marijuana    Comment: last used January 3  . Sexual activity: Yes    Partners: Male    Birth control/protection: None  Other Topics Concern  . Not on file  Social History Narrative  . Not on file   Social Determinants of Health   Financial Resource Strain: Not on file  Food Insecurity: Not on file  Transportation Needs: Not on file  Physical Activity: Not on file  Stress: Not on file  Social Connections: Not on file   Allergies  Allergen Reactions  . Other Anaphylaxis and Hives    Mushrooms  . Nsaids Hives, Swelling and Other (See Comments)    Body burns   . Reglan [Metoclopramide] Other (See Comments)    anxiety  . Sulfa Antibiotics Other (See Comments)    burns    Medications   Medications Prior to Admission  Medication Sig Dispense Refill Last Dose  . busPIRone (BUSPAR) 15  MG tablet Take by mouth.   07/10/2020 at  Unknown time  . Doxylamine-Pyridoxine (DICLEGIS) 10-10 MG TBEC Take 2 tablets by mouth at bedtime. If symptoms persist, add one tablet in the morning and one in the afternoon 100 tablet 5 07/10/2020 at Unknown time  . escitalopram (LEXAPRO) 10 MG tablet Take by mouth.   07/10/2020 at Unknown time  . lamoTRIgine (LAMICTAL) 200 MG tablet Take 100-200 mg by mouth See admin instructions. 145m in the morning 2056min the evening   07/09/2020 at Unknown time  . ondansetron (ZOFRAN ODT) 8 MG disintegrating tablet Take 1 tablet (8 mg total) by mouth every 8 (eight) hours as needed for nausea or vomiting. 20 tablet 1 07/09/2020 at Unknown time  . pantoprazole (PROTONIX) 40 MG tablet Take 1 tablet (40 mg total) by mouth daily. 30 tablet 6 07/09/2020 at Unknown time  . promethazine (PHENERGAN) 25 MG tablet Take 1 tablet (25 mg total) by mouth every 6 (six) hours as needed for nausea or vomiting. 30 tablet 0 07/10/2020 at Unknown time  . scopolamine (TRANSDERM-SCOP) 1 MG/3DAYS Place 1 patch (1.5 mg total) onto the skin every 3 (three) days. 10 patch 12 07/10/2020 at Unknown time  . acetaminophen (TYLENOL) 500 MG tablet Take 2 tablets (1,000 mg total) by mouth every 8 (eight) hours as needed for moderate pain or fever. 30 tablet 0   . Blood Pressure Monitoring (BLOOD PRESSURE KIT) DEVI 1 kit by Does not apply route once a week. 1 each 0   . diphenhydrAMINE (BENADRYL) 50 MG tablet Take 0.5 tablets (25 mg total) by mouth at bedtime as needed for sleep. 30 tablet 0   . folic acid (FOLVITE) 1 MG tablet Take 1 tablet (1 mg total) by mouth daily. 30 tablet 10      Vitals   Vitals:   07/10/20 1855 07/10/20 1900 07/10/20 1905 07/10/20 2119  BP:    (!) 134/103  Pulse:    (!) 118  Resp:    12  Temp:    98.8 F (37.1 C)  TempSrc:      SpO2: 95% 100% 96% 96%  Weight:      Height:         Body mass index is 32.88 kg/m.  Physical Exam   General: Laying comfortably in bed; teary eyed. HENT: Normal oropharynx and  mucosa. Normal external appearance of ears and nose. Neck: Supple, no pain or tenderness CV: No JVD. No peripheral edema. Pulmonary: Symmetric Chest rise. Normal respiratory effort. Abdomen: Soft to touch, non-tender. Ext: No cyanosis, edema, or deformity Skin: No rash. Normal palpation of skin.  Musculoskeletal: Normal digits and nails by inspection. No clubbing.  Neurologic Examination  Mental status/Cognition: Alert, oriented to self, place, month and year, good attention. Speech/language: Fluent, comprehension intact, object naming intact, repetition intact. Cranial nerves:   CN II Pupils equal and reactive to light, no VF deficits   CN III,IV,VI EOM intact, no gaze preference or deviation, no nystagmus   CN V normal sensation in V1, V2, and V3 segments bilaterally   CN VII no asymmetry, no nasolabial fold flattening   CN VIII normal hearing to speech   CN IX & X normal palatal elevation, no uvular deviation   CN XI 5/5 head turn and 5/5 shoulder shrug bilaterally   CN XII midline tongue protrusion   Motor:  Muscle bulk: normal, tone normal, pronator drift none tremor none Mvmt Root Nerve  Muscle Right Left Comments  SA C5/6 Ax  Deltoid 5 4+   EF C5/6 Mc Biceps 5 4+   EE C6/7/8 Rad Triceps 5 4+   WF C6/7 Med FCR     WE C7/8 PIN ECU     F Ab C8/T1 U ADM/FDI 5 4+   HF L1/2/3 Fem Illopsoas 5 4+ Give away weakness noted in testing strength in LUE and LLE.  KE L2/3/4 Fem Quad     DF L4/5 D Peron Tib Ant 5 4+   PF S1/2 Tibial Grc/Sol 5 4+    Reflexes:  Right Left Comments  Pectoralis      Biceps (C5/6) 2 2   Brachioradialis (C5/6) 2 2    Triceps (C6/7) 2 2    Patellar (L3/4) 2 2    Achilles (S1)      Hoffman      Plantar down down   Jaw jerk    Sensation:  Light touch Increased in LUE and LLE to light touch, decreased in L face.   Pin prick    Temperature    Vibration   Proprioception    Coordination/Complex Motor:  - Finger to Nose intact BL - Heel to shin  unable to do - Rapid alternating movement are slowed. - Gait: Deferred.  Labs   CBC:  Recent Labs  Lab 07/10/20 1752  WBC 8.6  HGB 10.1*  HCT 28.7*  MCV 88.9  PLT 470    Basic Metabolic Panel:  Lab Results  Component Value Date   NA 136 07/10/2020   K 3.5 07/10/2020   CO2 21 (L) 07/10/2020   GLUCOSE 74 07/10/2020   BUN 5 (L) 07/10/2020   CREATININE 0.66 07/10/2020   CALCIUM 9.0 07/10/2020   GFRNONAA >60 07/10/2020   GFRAA >60 02/08/2019   Lipid Panel: No results found for: LDLCALC HgbA1c: No results found for: HGBA1C Urine Drug Screen: No results found for: LABOPIA, COCAINSCRNUR, LABBENZ, AMPHETMU, THCU, LABBARB  Alcohol Level No results found for: [facility]  MRI Brain: Personally reviewed and negative for an acute stroke.  Impression   Leah Shea is a 35 y.o. female who is [redacted] weeks pregnant with PMH significant for bronchitis, HTN, speel apnea, complex partial seizures, migraine headaches who presents with nauseae, vomiting, headache and 3 of her typical simple partial seizures over the last 2 days, has not been able to keep her lamictal down due to nasuea and vomiting. Has missed several doses of Lamictal, infact reports to have only been able to tolerate 6-7 pills over the course of her antire pregnancy. Her neurologic examination is notable for mild LUE and LLE weakness. STAT MRI Brain was obtained and was negative for stroke.  Will need to restart her on Lamotrigine titration schedule and use Keppra as a bridge until Lamotrigine is therapeutic, then taper off Keppra.  As for this episode, I suspect that this is either her post ictal todds vs functional weakness as she does appear very emotionally overwhelmed and has some give away component noted to her weakness.  Impression: Simple partial seizure Nausea/vomiting Left sided numbness Left sided weakness Migraine headache Pos ictal todd's paresis.  Recommendations   - Keppra loading dose of 2G IV once  given - Will defer control of nausea/vomiting to Primary team - Headache cocktail one time ordered with:  - IV fluids bolus 1L  - Heating pads  - Headache precautions  - melatonin  - Benadryl IV 38m once  - Magnesium  - Tylenol PO 6561monce  AED titration as below: Lamotrigine  Morning Lamotrigine Evening Lamotrigine Keppra  Week 1 3m  50626mBID  Week 2 2573m39m69m26mg47m  Week 3 74mg 26m 59m BI46meek 4 74mg 74m46m26m43mD  59m 5 739mg 74mg 539m B66mWee49m739mg 739mg 5026m24mD 78mk 7439mmg 739mg 5026mg B81mWe226m 1064m1026mg 5026mg BID4mek 28m26mg51mmg 274mg BID  W91m10 51mg 282m Stop Keppra    73mte:73mified by the team that her headache is worse after the cocktail. Ordered Norco with Magnesium sulfate along with an MR venogram without contrast. Will get time of flight imaging to rule out Sinus venous thrombosis. ______________________________________________________________________   Thank you for the opportunity to take part in the care of this patient. If you have any further questions, please contact the neurology consultation attending.  Signed,  Zaeden Lastinger [facility] _   _ __   _ [phone] __   _ __   __ _

## 2020-07-10 NOTE — MAU Provider Note (Addendum)
History     CSN: [id number]  Arrival date and time: 07/10/20 1351   Event Date/Time   First Provider Initiated Contact with Patient 07/10/20 1624      Chief Complaint  Patient presents with  . Uvula swollen   35 y.o. [id number] @23 .2 wks presenting with "swollen uvula". Reports onset around 3am when she woke. Rates pain 6/10. States hurts to swallow and causing difficulty breathing. Reports several episodes of N/V yesterday and last night. Reports no further vomiting since taking Zofran pv at 10am. Also reports feeling migraine w/aura and possible focal seizure while sitting in the waiting room in MAU. HA is bilateral temporal and periorbital. States having burning pain in chest. Hx of focal seizure disorder and states she vomited her meds up this am.  Additional note by Carmelia Roller CNM: Chart review showed several complaints of swelling of uvula starting in November 2021 (2 mos post Covid) >> seen by ENT on 12/19/19  (labeled pharyngeal swelling) -- hospitalized -- had panic over this during hospitalization with left sided tingling and felt she could not breathe. Had another presentation for Uvula swelling 01/23/20 > had Covid again. Presented again 01/27/20 with uvula swelling  OB History    Gravida  7   Para  4   Term  2   Preterm  2   AB  2   Living  4     SAB  2   IAB      Ectopic      Multiple      Live Births  4           Past Medical History:  Diagnosis Date  . Anemia   . Anxiety   . Asthma   . Blood dyscrasia    sickle trait  . Bronchitis, acute   . Chronic hypertension affecting pregnancy 04/30/2020  . Complex partial seizures ([facility] [facility]) 2020  . Depression   . Diverticular disease   . G6PD deficiency   . Headache(784.0)    migraines  . PTSD (post-traumatic stress disorder)   . Shortness of breath    with exertion or panic attack  . Sleep apnea    awaiting a sleep study to be done at [facility]    Past Surgical History:  Procedure Laterality Date  .  BREAST SURGERY Bilateral    reductions  . CESAREAN SECTION  2010, [id number]   x 3  . EYE SURGERY     x 10 as a child  . PILONIDAL CYST EXCISION  2020  . TONSILLECTOMY N/A 10/07/2013   Procedure: TONSILLECTOMY;  Surgeon: Melida Quitter, MD;  Location: [facility] OR;  Service: ENT;  Laterality: N/A;  . TUMOR REMOVAL     tumor removed from back    Family History  Problem Relation Age of Onset  . Breast cancer Mother   . Breast cancer Maternal Aunt 36       metastatic  . Cancer Maternal Uncle        unk type  . Breast cancer Maternal Grandmother   . Prostate cancer Maternal Grandfather        metastatic  . Cancer Maternal Aunt        either breast or ovarian     Social History   Tobacco Use  . Smoking status: Former Smoker    Packs/day: 0.25    Years: 1.00    Pack years: 0.25    Types: Cigarettes    Quit date: 07/02/2013    Years since quitting:  7.0  . Smokeless tobacco: Never Used  Vaping Use  . Vaping Use: Never used  Substance Use Topics  . Alcohol use: Not Currently  . Drug use: Not Currently    Types: Marijuana    Comment: last used January 3    Allergies:  Allergies  Allergen Reactions  . Other Anaphylaxis and Hives    Mushrooms  . Nsaids Hives, Swelling and Other (See Comments)    Body burns   . Reglan [Metoclopramide] Other (See Comments)    anxiety  . Sulfa Antibiotics Other (See Comments)    burns    Medications Prior to Admission  Medication Sig Dispense Refill Last Dose  . busPIRone (BUSPAR) 15 MG tablet Take by mouth.   07/10/2020 at Unknown time  . Doxylamine-Pyridoxine (DICLEGIS) 10-10 MG TBEC Take 2 tablets by mouth at bedtime. If symptoms persist, add one tablet in the morning and one in the afternoon 100 tablet 5 07/10/2020 at Unknown time  . escitalopram (LEXAPRO) 10 MG tablet Take by mouth.   07/10/2020 at Unknown time  . lamoTRIgine (LAMICTAL) 200 MG tablet Take 100-200 mg by mouth See admin instructions. 146m in the morning 2028min the evening    07/09/2020 at Unknown time  . ondansetron (ZOFRAN ODT) 8 MG disintegrating tablet Take 1 tablet (8 mg total) by mouth every 8 (eight) hours as needed for nausea or vomiting. 20 tablet 1 07/09/2020 at Unknown time  . pantoprazole (PROTONIX) 40 MG tablet Take 1 tablet (40 mg total) by mouth daily. 30 tablet 6 07/09/2020 at Unknown time  . promethazine (PHENERGAN) 25 MG tablet Take 1 tablet (25 mg total) by mouth every 6 (six) hours as needed for nausea or vomiting. 30 tablet 0 07/10/2020 at Unknown time  . scopolamine (TRANSDERM-SCOP) 1 MG/3DAYS Place 1 patch (1.5 mg total) onto the skin every 3 (three) days. 10 patch 12 07/10/2020 at Unknown time  . acetaminophen (TYLENOL) 500 MG tablet Take 2 tablets (1,000 mg total) by mouth every 8 (eight) hours as needed for moderate pain or fever. 30 tablet 0   . Blood Pressure Monitoring (BLOOD PRESSURE KIT) DEVI 1 kit by Does not apply route once a week. 1 each 0   . diphenhydrAMINE (BENADRYL) 50 MG tablet Take 0.5 tablets (25 mg total) by mouth at bedtime as needed for sleep. 30 tablet 0   . folic acid (FOLVITE) 1 MG tablet Take 1 tablet (1 mg total) by mouth daily. 30 tablet 10    Review of Systems  Constitutional: Negative for chills and fever.  HENT: Positive for sore throat. Negative for congestion.   Eyes: Positive for visual disturbance.  Respiratory: Positive for shortness of breath. Negative for cough.   Cardiovascular: Positive for chest pain.  Gastrointestinal: Positive for nausea. Negative for abdominal pain and vomiting.  Genitourinary: Negative for vaginal bleeding and vaginal discharge.  Neurological: Positive for headaches. Negative for weakness.   Physical Exam   Blood pressure (!) 141/80, pulse 91, temperature 98 F (36.7 C), temperature source Oral, resp. rate 20, height 5' 6.5" (1.689 m), weight 93.8 kg, last menstrual period 01/29/2020, SpO2 96 %.  Physical Exam Vitals and nursing note reviewed.  Constitutional:      General: She is  not in acute distress.    Appearance: Normal appearance.  HENT:     Head: Normocephalic and atraumatic.     Mouth/Throat:     Lips: Pink.     Mouth: Mucous membranes are moist.  Pharynx: Uvula midline. Uvula swelling present.  Cardiovascular:     Rate and Rhythm: Normal rate and regular rhythm.     Heart sounds: Normal heart sounds.  Pulmonary:     Effort: Pulmonary effort is normal. No respiratory distress.     Breath sounds: Normal breath sounds. No stridor. No wheezing, rhonchi or rales.  Musculoskeletal:        General: Normal range of motion.     Cervical back: Normal range of motion and neck supple.  Skin:    General: Skin is warm and dry.  Neurological:     General: No focal deficit present.     Mental Status: She is alert and oriented to person, place, and time.     Cranial Nerves: No cranial nerve deficit.     Motor: No weakness.     Deep Tendon Reflexes: Reflexes normal.  Psychiatric:        Mood and Affect: Mood normal.        Behavior: Behavior normal.   EFM: 150 bpm, mod variability, no accels, occ variable decels Toco: none  Results for orders placed or performed during the hospital encounter of 07/10/20 (from the past 24 hour(s))  Urinalysis, Routine w reflex microscopic Urine, Clean Catch     Status: Abnormal   Collection Time: 07/10/20  2:53 PM  Result Value Ref Range   Color, Urine YELLOW YELLOW   APPearance CLOUDY (A) CLEAR   Specific Gravity, Urine 1.012 1.005 - 1.030   pH 7.0 5.0 - 8.0   Glucose, UA NEGATIVE NEGATIVE mg/dL   Hgb urine dipstick NEGATIVE NEGATIVE   Bilirubin Urine NEGATIVE NEGATIVE   Ketones, ur NEGATIVE NEGATIVE mg/dL   Protein, ur NEGATIVE NEGATIVE mg/dL   Nitrite NEGATIVE NEGATIVE   Leukocytes,Ua NEGATIVE NEGATIVE  CBC     Status: Abnormal   Collection Time: 07/10/20  5:52 PM  Result Value Ref Range   WBC 8.6 4.0 - 10.5 K/uL   RBC 3.23 (L) 3.87 - 5.11 MIL/uL   Hemoglobin 10.1 (L) 12.0 - 15.0 g/dL   HCT 28.7 (L) 36.0 - 46.0  %   MCV 88.9 80.0 - 100.0 fL   MCH 31.3 26.0 - 34.0 pg   MCHC 35.2 30.0 - 36.0 g/dL   RDW 12.5 11.5 - 15.5 %   Platelets 202 150 - 400 K/uL   nRBC 0.0 0.0 - 0.2 %  Basic metabolic panel     Status: Abnormal   Collection Time: 07/10/20  5:52 PM  Result Value Ref Range   Sodium 136 135 - 145 mmol/L   Potassium 3.5 3.5 - 5.1 mmol/L   Chloride 108 98 - 111 mmol/L   CO2 21 (L) 22 - 32 mmol/L   Glucose, Bld 74 70 - 99 mg/dL   BUN 5 (L) 6 - 20 mg/dL   Creatinine, Ser 0.66 0.44 - 1.00 mg/dL   Calcium 9.0 8.9 - 10.3 mg/dL   GFR, Estimated >60 >60 mL/min   Anion gap 7 5 - 15   MAU Course  Procedures LR Zofran Benadryl Decadron Flexeril  MDM Labs ordered and reviewed.  1900: HA not improved after HA cocktail. Pt appears comfortable. Pt reports she takes Sumatriptan at home. Consult with Dr. Kennon Rounds, recommends Flexeril.  2045: Pt reports worsening HA, now 9/10.  Transfer of care given to Leslye Peer, [place]  07/10/2020 8:59 PM   Assumed care: 2105 hrs:  Went in to reassess Patient states headache is worse, primarily on left  States "It feels like my body is cut in half, left side is numb" Reports her neurologist is at the [facility], pt is on Lamictal but vomited up  States they were investigating elevated Prolactin levels.  States they "kind of saw something on my pituitary, but they weren't sure" States has had two focal seizures since being here, primarily seeing flashes  Exam notable for decreased plantar and dorsiflexion on left Sensation different, states is numb and "tingly" on left DTRs 3+  2115 hrs:  SInce headache is worsening and she now has differences in strength/sensation on left, Neurology was consulted and recommended calling Code Stroke. Dr Kennon Rounds updated. This was done, Neurologist and RRRN came quickly. Patient was assessed by Neurologist and taken to MRI (which ended up resulting negative for CVA)  He states he thinks this is a combination  of seizure activity and migraine, since she has not been able to consistently keep down her Lamictal He ordered meds/interventions for these and will hopefully be able to discharge her home if she improves  2225 hrs:  He recommends Keppra with restarting Lamictal as a bridge.  He is also placing orders for additional migraine treatment  0120 hrs:  Pt states headache is now a "7-8".  Tearful.  States is seeing double now.  Reassured her this is probably related to all the meds we have given her. Updated Dr Lorrin Goodell who is ordering an MR Venogram.   0200 hrs:  Taken to MRI for MR Venogram 0300 hrs:  Returned from MRI, states headache is now a "9" 0405 hrs:   RN states patient sleeping.  Pt states HA now a 7.  Neurologist updated.  He states she can be discharged home.  He doesn't recommend tryptans or propranolol for her migraines.  States he wants her neurologist to manage any migraine meds.   He has written out schedule for bridge with Lamictal and Keppra.  I gave her a copy of this and per Dr Kennon Rounds, I wrote her Rx for enough to get through the first 4 weeks of the 10 week bridge  0450 hrs:  Discharge orders written   Patient (who has been crying the entire time she was here) began to sob.  States she is worried about her weakness, double vision and migraine pain at home. I told her the post-Ictal weakness/vision changes are related to the seizures per neuro, so if we can get her back on her meds those would resolve because the seizures would improve.  I did agree to Rx small # oxy IR but warned of rebound headache so use sparingly.  Database showed no narcotics since late 2021. I asked if there were other issues making her cry like support at home, but she states she has good support at home. Stated to Rn she worried about being labeled a problem patient if she returned for other migraines.  We reassured her we would provide care for her whenever she came in.   I asked neurologist about discharge  meds for future migraines: "I would not recommend triptans at this time. Propranolol might be a good option but with nausea and vomiting, might result in lowering of her blood pressure or orthostatic episode. Can use Amitriptyline but I want that to be started by her outpatient neurologist. Ideally we want someone to be able to monitor her on a new med." Triptans are safe in pregnancy by the way, we just dont use them in patients with any type of stroke symptoms."  I sent message to see if she can get referral to [facility]. She states it is very hard to be seen by Neuro at the [place] and they make her wait many months.  So I told her we can try to see if her [employer] would cover civilian neurologists.   Detailed instructions about how to bridge the Lamictal increasing dosages and Keppra x 10 weeks copied into her discharge instructions I provided first 4 weeks of Lamictal and first 8 weeks of Keppra to her pharmacy.   AED titration as below: Lamotrigine Morning Lamotrigine Evening Lamotrigine Keppra  Week 1 157m  50757mBID  Week 2 2559m57m53m15mg78m  Week 3 52mg 215m 559m BI64meek 4 52mg 52m32m15m29mD  32m 5 757mg 52mg 562m B36mWee357m757mg 757mg 5015m62mD 28mk 712mmg 757mg 5015mg B78mWe41m 1078m1015mg 5015mg BID33mek 27m15mg257mmg 252mg BID  W73m10 66mg 247m Stop Keppra    24mess757m and Plan  A:   SIngle IUP at [redacted]w[redacted]d        Nausea and [id number] pregnancy        SImple partial seizure        Left sided weakness and numbness        Migraine        Post Ictal Todd's Paresis  P:    Discharged home         Rx Lamictal x first 4 weeks of above schedule         Rx Keppra x first 8 weeks of above schedule         New scopolamine patch placed (MRI tech took hers off for scan)         Small # rx of oxy IR for migraine resistant to Tylenol and Phenergan         Patient expresses frustration with [facility]  and messag[place] to see if office can find out if her insurance would allow her to see [facility]          [facility] in OB office [facility] for OB care Encouraged to return if she develops worsening of symptoms, increase in pain, fever, or other concerning symptoms.    Edmund Holcomb L, CNMSeabron Spates

## 2020-07-10 NOTE — MAU Note (Signed)
Presents with c/o of uvula swelling secondary vomiting.  Reports no longer having any vomiting. Denies VB or LOF.  Endorses +FM.

## 2020-07-10 NOTE — Code Documentation (Signed)
Responded to Code Stroke called at 2119 on a pt already in MAU, [id number]. Code Stroke was called for headache and L sided weakness/numbness. Pt stated that she thought she had a seizure prior to deficits starting but this was not witnessed.  NIH-1 for sensory. Decision made to take pt to MRI. Pt arrived to MRI at 2135. MRI negative for stroke. Code Stroke cancelled at 2155. Pt transported back to MAU and 2g Keppra started.

## 2020-07-11 ENCOUNTER — Inpatient Hospital Stay (HOSPITAL_COMMUNITY): Payer: No Typology Code available for payment source

## 2020-07-11 DIAGNOSIS — F1010 Alcohol abuse, uncomplicated: Secondary | ICD-10-CM | POA: Insufficient documentation

## 2020-07-11 DIAGNOSIS — D550 Anemia due to glucose-6-phosphate dehydrogenase [G6PD] deficiency: Secondary | ICD-10-CM | POA: Insufficient documentation

## 2020-07-11 DIAGNOSIS — L74510 Primary focal hyperhidrosis, axilla: Secondary | ICD-10-CM

## 2020-07-11 DIAGNOSIS — D649 Anemia, unspecified: Secondary | ICD-10-CM | POA: Insufficient documentation

## 2020-07-11 DIAGNOSIS — G40109 Localization-related (focal) (partial) symptomatic epilepsy and epileptic syndromes with simple partial seizures, not intractable, without status epilepticus: Secondary | ICD-10-CM | POA: Diagnosis not present

## 2020-07-11 DIAGNOSIS — G40319 Generalized idiopathic epilepsy and epileptic syndromes, intractable, without status epilepticus: Secondary | ICD-10-CM | POA: Insufficient documentation

## 2020-07-11 DIAGNOSIS — J45909 Unspecified asthma, uncomplicated: Secondary | ICD-10-CM | POA: Insufficient documentation

## 2020-07-11 DIAGNOSIS — H509 Unspecified strabismus: Secondary | ICD-10-CM | POA: Insufficient documentation

## 2020-07-11 DIAGNOSIS — G4733 Obstructive sleep apnea (adult) (pediatric): Secondary | ICD-10-CM | POA: Insufficient documentation

## 2020-07-11 DIAGNOSIS — K5730 Diverticulosis of large intestine without perforation or abscess without bleeding: Secondary | ICD-10-CM | POA: Insufficient documentation

## 2020-07-11 DIAGNOSIS — Z3A23 23 weeks gestation of pregnancy: Secondary | ICD-10-CM

## 2020-07-11 DIAGNOSIS — K219 Gastro-esophageal reflux disease without esophagitis: Secondary | ICD-10-CM | POA: Insufficient documentation

## 2020-07-11 DIAGNOSIS — Z634 Disappearance and death of family member: Secondary | ICD-10-CM | POA: Insufficient documentation

## 2020-07-11 DIAGNOSIS — F1210 Cannabis abuse, uncomplicated: Secondary | ICD-10-CM | POA: Insufficient documentation

## 2020-07-11 DIAGNOSIS — E8809 Other disorders of plasma-protein metabolism, not elsewhere classified: Secondary | ICD-10-CM | POA: Insufficient documentation

## 2020-07-11 DIAGNOSIS — O99352 Diseases of the nervous system complicating pregnancy, second trimester: Secondary | ICD-10-CM | POA: Diagnosis not present

## 2020-07-11 DIAGNOSIS — F4310 Post-traumatic stress disorder, unspecified: Secondary | ICD-10-CM | POA: Insufficient documentation

## 2020-07-11 DIAGNOSIS — R87612 Low grade squamous intraepithelial lesion on cytologic smear of cervix (LGSIL): Secondary | ICD-10-CM | POA: Insufficient documentation

## 2020-07-11 DIAGNOSIS — F4312 Post-traumatic stress disorder, chronic: Secondary | ICD-10-CM | POA: Insufficient documentation

## 2020-07-11 DIAGNOSIS — E221 Hyperprolactinemia: Secondary | ICD-10-CM | POA: Insufficient documentation

## 2020-07-11 DIAGNOSIS — T7421XA Adult sexual abuse, confirmed, initial encounter: Secondary | ICD-10-CM | POA: Insufficient documentation

## 2020-07-11 DIAGNOSIS — G43719 Chronic migraine without aura, intractable, without status migrainosus: Secondary | ICD-10-CM | POA: Insufficient documentation

## 2020-07-11 DIAGNOSIS — O219 Vomiting of pregnancy, unspecified: Secondary | ICD-10-CM

## 2020-07-11 DIAGNOSIS — R0600 Dyspnea, unspecified: Secondary | ICD-10-CM | POA: Insufficient documentation

## 2020-07-11 DIAGNOSIS — D573 Sickle-cell trait: Secondary | ICD-10-CM | POA: Insufficient documentation

## 2020-07-11 DIAGNOSIS — H5010 Unspecified exotropia: Secondary | ICD-10-CM | POA: Insufficient documentation

## 2020-07-11 DIAGNOSIS — G40219 Localization-related (focal) (partial) symptomatic epilepsy and epileptic syndromes with complex partial seizures, intractable, without status epilepticus: Secondary | ICD-10-CM | POA: Insufficient documentation

## 2020-07-11 DIAGNOSIS — G43809 Other migraine, not intractable, without status migrainosus: Secondary | ICD-10-CM | POA: Diagnosis present

## 2020-07-11 HISTORY — DX: Primary focal hyperhidrosis, axilla: L74.510

## 2020-07-11 IMAGING — MR MR [PERSON_NAME] HEAD
3 series · 42 of 48 positions shown · non-contrast
Comparison: Prior MRI from [DATE].

CLINICAL DATA: Initial evaluation for acute headache, left-sided
numbness.

EXAM:
MR VENOGRAM HEAD WITHOUT CONTRAST
TECHNIQUE: Angiographic images of the intracranial venous structures were
acquired using MRV technique without intravenous contrast.

[Series 5: tof_fl2d_paracor · coronal · 2.0mm · 0.98mm/px · 12 of 128 slices shown]
[im 1/128]
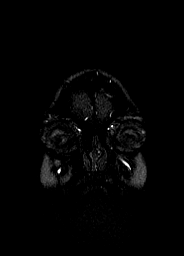
[im 12/128]
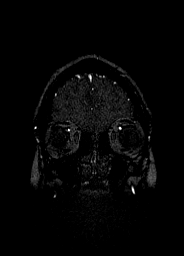
[im 24/128]
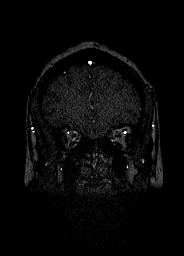
[im 35/128]
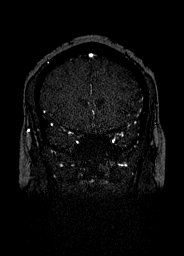
[im 47/128]
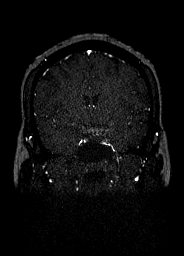
[im 58/128]
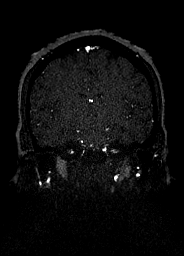
[im 70/128]
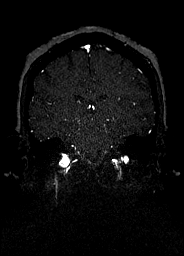
[im 81/128]
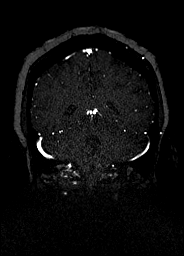
[im 93/128]
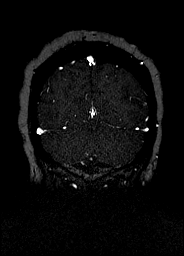
[im 104/128]
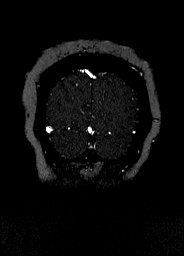
[im 116/128]
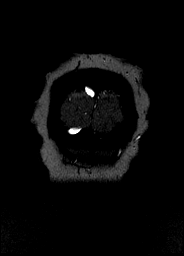
[im 128/128]
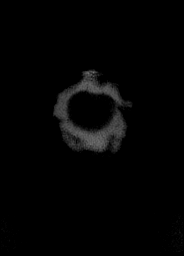

[Series 9: venous inhance coronal · coronal · portal-venous · 0.9mm · 0.57mm/px · 18 of 208 slices shown]
[im 1/208]
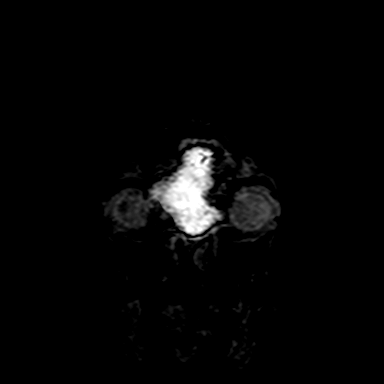
[im 13/208]
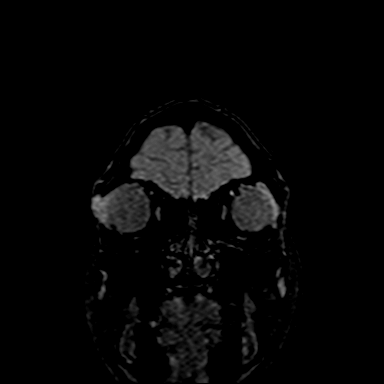
[im 25/208]
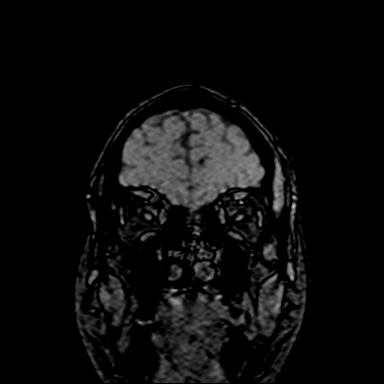
[im 37/208]
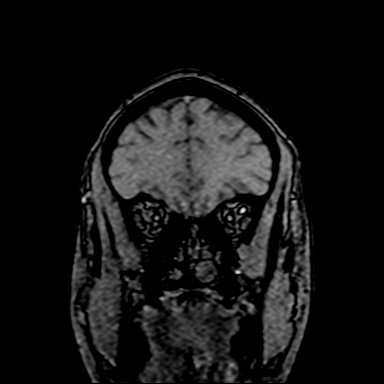
[im 49/208]
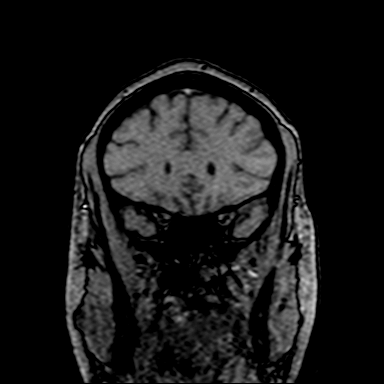
[im 61/208]
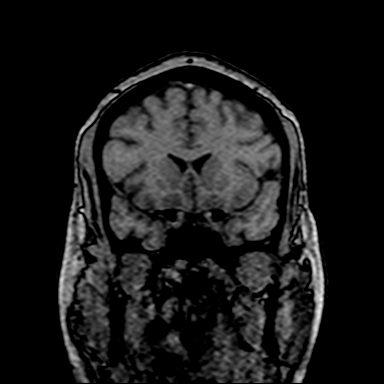
[im 74/208]
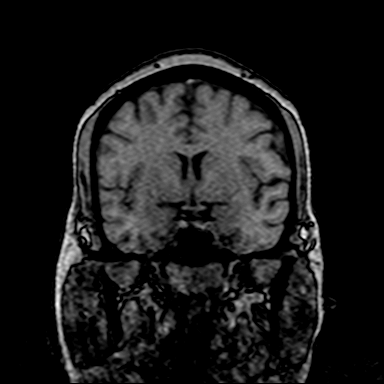
[im 86/208]
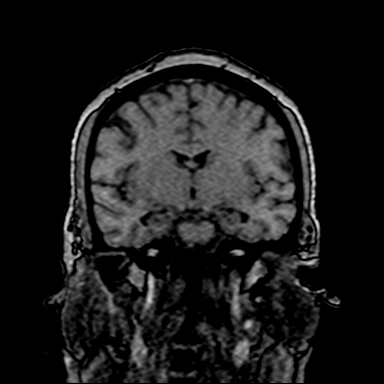
[im 98/208]
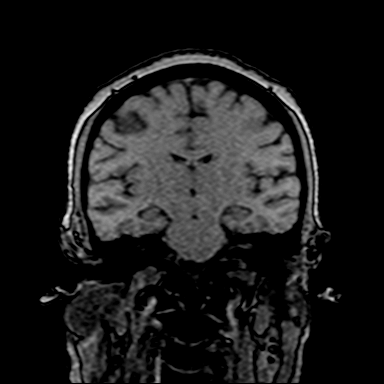
[im 110/208]
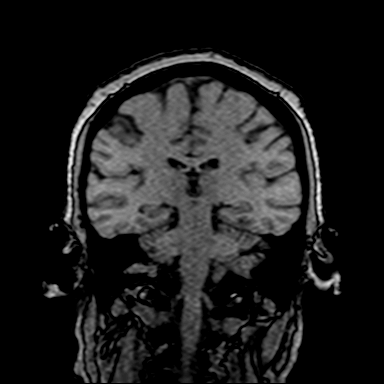
[im 122/208]
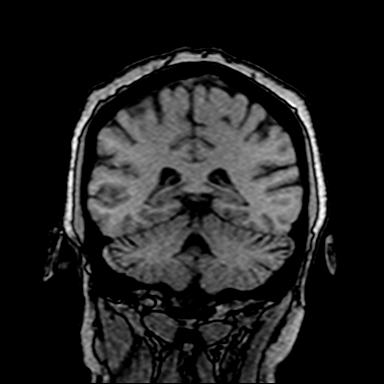
[im 134/208]
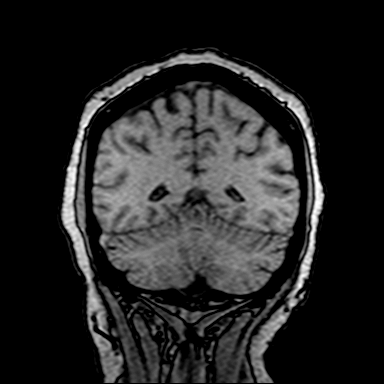
[im 147/208]
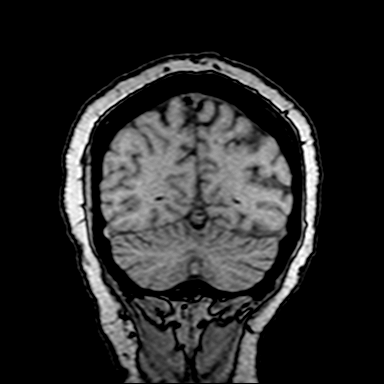
[im 159/208]
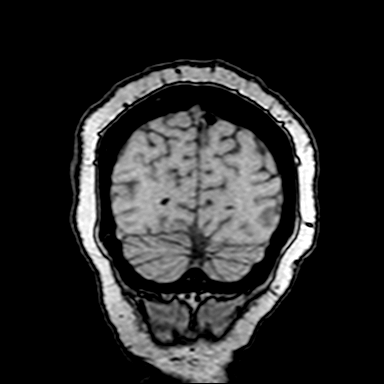
[im 171/208]
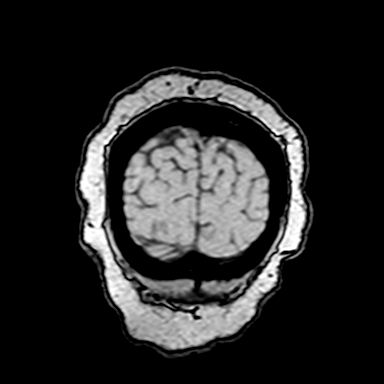
[im 183/208]
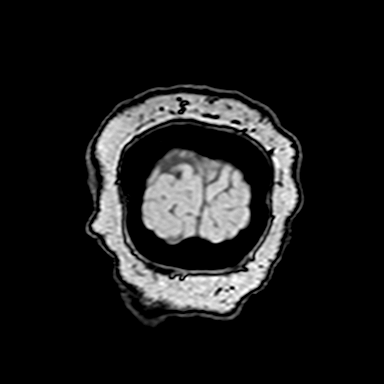
[im 195/208]
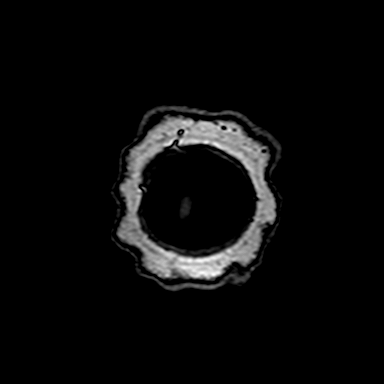
[im 208/208]
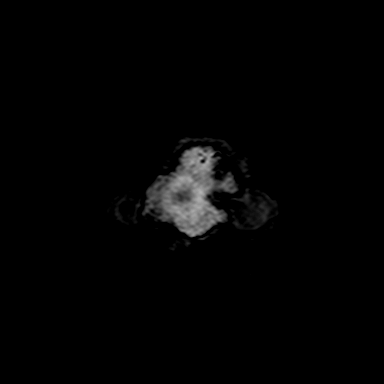

[Series 10: venous inhance coronal_msum · coronal · portal-venous · 0.9mm · 0.57mm/px · 12 of 208 slices shown]
[im 1/208]
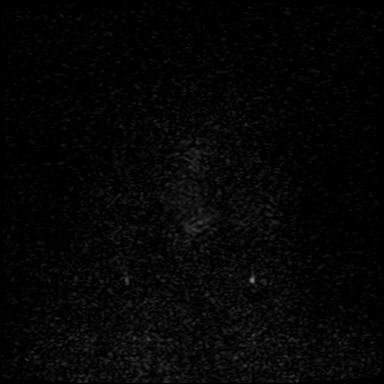
[im 13/208]
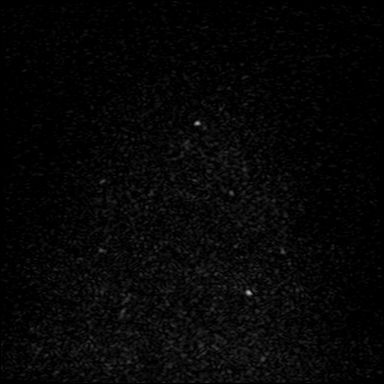
[im 25/208]
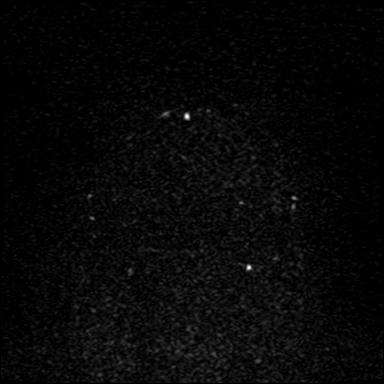
[im 37/208]
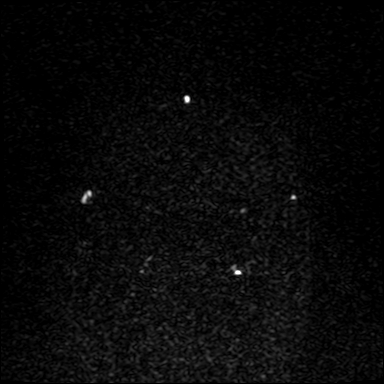
[im 61/208]
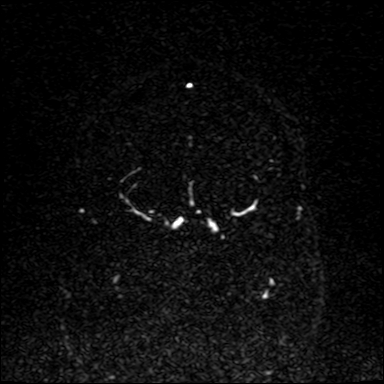
[im 86/208]
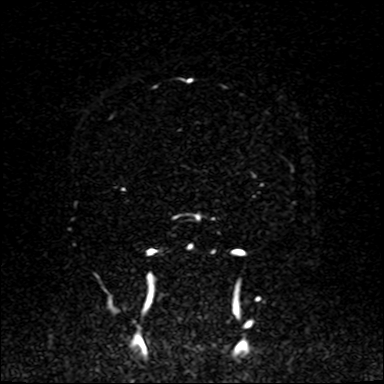
[im 110/208]
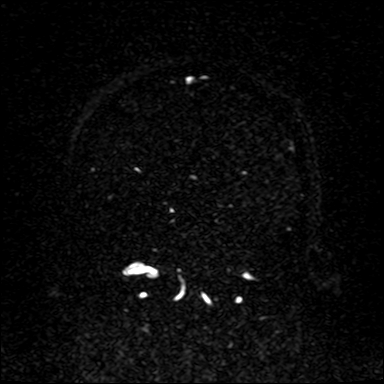
[im 122/208]
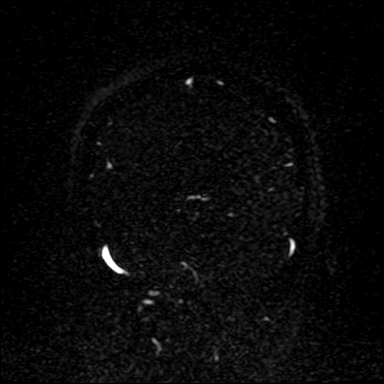
[im 147/208]
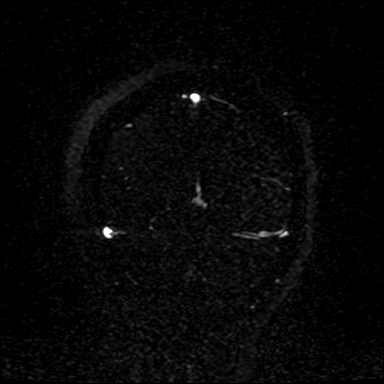
[im 171/208]
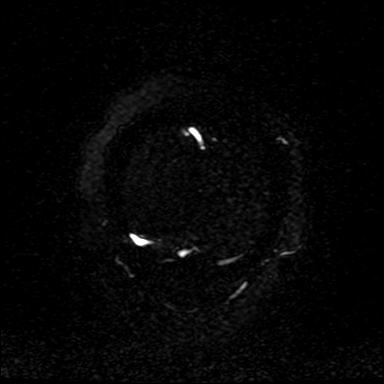
[im 183/208]
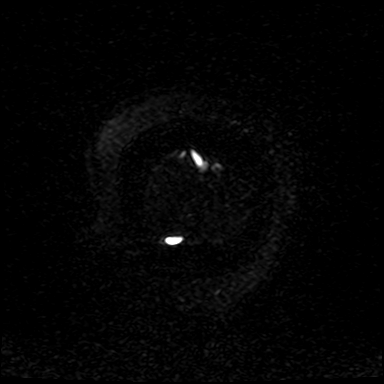
[im 195/208]
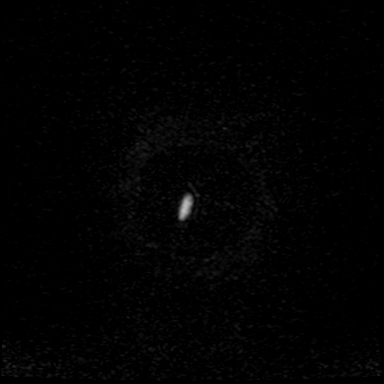

[42 of 48 positions shown; findings below may reference images not displayed]

FINDINGS: Normal flow related signal seen throughout the superior sagittal
sinus to the torcula. Right transverse and sigmoid sinuses are
patent as is the visualized proximal right internal jugular vein.
Left transverse and sigmoid sinuses are diffusely hypoplastic but
grossly patent as well. No appreciable focal stenosis. Visualized
proximal left internal jugular vein patent. Straight sinus, vein of
KLPIGBB, internal cerebral veins, and basal veins of KLPIGBB appear
patent. No evidence for dural sinus thrombosis.
IMPRESSION: Negative intracranial MRV.  No evidence for dural sinus thrombosis.

## 2020-07-11 MED ORDER — SCOPOLAMINE 1 MG/3DAYS TD PT72
1.0000 | MEDICATED_PATCH | Freq: Once | TRANSDERMAL | Status: DC
  Administered 2020-07-11: 1.5 mg via TRANSDERMAL
  Filled 2020-07-11: qty 1

## 2020-07-11 MED ORDER — OXYCODONE HCL 5 MG PO TABS: 5.0000 mg | ORAL_TABLET | Freq: Four times a day (QID) | ORAL | 0 refills | Status: AC | PRN

## 2020-07-11 MED ORDER — LAMOTRIGINE 25 MG PO TABS: 25.0000 mg | ORAL_TABLET | ORAL | 0 refills | Status: DC

## 2020-07-11 MED ORDER — LACTATED RINGERS IV BOLUS
125.0000 mL | Freq: Once | INTRAVENOUS | Status: AC
  Administered 2020-07-11: 125 mL via INTRAVENOUS

## 2020-07-11 MED ORDER — LEVETIRACETAM 500 MG PO TABS: 500.0000 mg | ORAL_TABLET | Freq: Two times a day (BID) | ORAL | 0 refills | Status: DC

## 2020-07-11 NOTE — MAU Note (Signed)
Pt needing to use restroom. RN and nurse tech at bedside to assist patient. When patient sat up stated that she was feeling dizzy. RN and nurse tech helped patient get comfortable in bed and assisted patient to use bedpan.  Provider M. Mayford Knife, CNM aware that patient could not ambulate.

## 2020-07-11 NOTE — Discharge Instructions (Signed)
Chronic Migraine Headache A migraine is a type of headache that is usually stronger and more sudden than other headaches. Migraines are characterized by an intense pulsing, throbbing pain that is usually only present on one side of the head. Migraine pain usually gets worse with activity. Migraines can cause nausea, vomiting, sensitivity to light and sound, and vision changes. Migraines that keep coming back are called recurrent migraines. A migraine is called a chronic migraine if it happens at least 15 days in a month for more than 3 months. Talk with your health care provider about what things may bring on (trigger) your migraines. What are the causes? The exact cause of this condition is not known. However, a migraine may be caused when nerves in the brain become irritated and release chemicals that cause inflammation of blood vessels. The inflammation of the blood vessels causes pain. Migraines may be triggered or caused by:  Smoking.  Certain foods and drinks, such as: ? Aged cheese. ? Chocolate. ? Alcohol. ? Caffeine. ? Foods or drinks that contain nitrates, glutamate, aspartame, MSG, or tyramine.  Medicines, such as birth control pills or some blood pressure medicines. Other things that may trigger a migraine include:  Menstruation.  Emotional stress.  Lack of sleep or too much sleep.  Tiredness (fatigue).  Bright lights or loud noises.  Odors.  Weather changes and high altitude. What increases the risk? The following factors may make you more likely to experience chronic migraine:  Having migraines or a family history of migraines.  Having a mental health condition, such as depression or anxiety.  Having to take a lot of pain medicine.  Having sleep problems.  Having heart disease, diabetes, or obesity. What are the signs or symptoms? Symptoms of a migraine vary for each person and may include:  Pulsating or throbbing pain.  Pain that is usually only  present on one side of the head. In some cases, the pain may be on both sides of the head or around the head or neck.  Severe pain that prevents you from doing daily activities.  Pain that gets worse with physical activity.  Nausea, vomiting, or both.  Pain with exposure to bright lights, loud noises, or activity.  General sensitivity to bright lights, loud noises, or smells.  Dizziness. A sign that a migraine is becoming chronic is an increasing number of migraine episodes. It is considered chronic if the migraine happens at least 15 days in a month for more than 3 months. How is this diagnosed? This condition is often diagnosed based on:  Your symptoms and medical history.  A physical exam. You may also have tests, including:  A CT scan or an MRI of your brain. These imaging tests cannot diagnose migraines, but they can help to rule out other causes of headaches.  Taking fluid from the spine (lumbar puncture) and analyzing it (cerebrospinal fluid analysis, or CSF analysis).  Blood tests. How is this treated? This condition is treated with:  Medicines. These help to: ? Lessen pain and nausea. ? Prevent migraines.  Lifestyle changes, such as changes to your diet or sleeping patterns.  Behavior therapy. This may include: ? Relaxation training. ? Biofeedback. This is a treatment that teaches you to relax and use your brain to lower your heart rate and control your breathing. ? Cognitive behavioral therapy (CBT). This is a form of talk therapy. This therapy helps you set goals and follow up on the changes that you make.  Acupuncture.  Using a device that provides electrical stimulation to your nerves, which can relieve pain (neuromodulation therapy).  Surgery, if the other treatments are not working. Follow these instructions at home: Medicines  Take over-the-counter and prescription medicines only as told by your health care provider.  Ask your health care provider if  the medicine prescribed to you requires you to avoid driving or using machinery. Lifestyle  Do not use any products that contain nicotine or tobacco, such as cigarettes, e-cigarettes, and chewing tobacco. If you need help quitting, ask your health care provider.  Do not drink alcohol.  Get 7-9 hours of sleep each night, or the amount of sleep recommended by your health care provider.  Find ways to manage stress, such as meditation, deep breathing, or yoga.  Maintain a healthy weight. If you need help losing weight, ask your health care provider.  Exercise regularly. Aim for 150 minutes of moderate-intensity exercise, such as walking, biking, or yoga, or 75 minutes of vigorous exercise each week. Vigorous exercise includes running, circuit training, and swimming.   General instructions  Keep a journal to find out what triggers your migraines so you can avoid these triggers. For example, write down: ? What you eat and drink. ? How much sleep you get. ? Any change to your diet or medicines.  Lie down in a dark, quiet room when you have a migraine.  Try placing a cool towel over your head when you have a migraine.  Keep lights dim, if bright lights bother you or make your migraines worse.  Keep all follow-up visits as told by your health care provider. This is important.   Where to find more information  Coalition for Headache and Migraine Patients (CHAMP): headachemigraine.org  American Migraine Foundation: americanmigrainefoundation.org  National Headache Foundation: headaches.org Contact a health care provider if:  Your pain does not improve, even with medicine.  Your migraines continue to return, even with medicine. Get help right away if:  Your migraine becomes severe and medicine does not help.  You have a stiff neck and fever.  You have a loss of vision.  You have muscle weakness or loss of muscle control.  You start losing your balance, or you have trouble  walking.  You feel like you may faint, or you faint.  You start having sudden and unexpected, severe headaches.  You have a seizure. Summary  Migraine headaches are usually stronger and more sudden than other headaches. Migraines are characterized by an intense pulsing, throbbing pain that is usually only present on one side of the head.  Migraines that keep coming back are called recurrent migraines. A migraine is called a chronic migraine if it happens 15 days in a month for more than 3 months.  Certain things may trigger migraines, such as lack of sleep or too much sleep, smoking, certain foods, alcohol, stress, and certain medicines.  Your treatment plan may include medicines, lifestyle changes, and behavior therapy. This information is not intended to replace advice given to you by your health care provider. Make sure you discuss any questions you have with your health care provider. Document Revised: 03/20/2019 Document Reviewed: 03/20/2019 Elsevier Patient Education  2021 Elsevier Inc.  Pregnancy and Epilepsy  Epilepsy is a condition in which a person has repeated seizures over time. Epilepsy can cause problems during pregnancy, but with the right care, the chances of having a normal, healthy baby are good. Most women with epilepsy have normal pregnancies and healthy babies. How does this condition  affect me? Women with epilepsy are more likely to have seizures during pregnancy due to hormonal changes, stress, or a lower dose of seizure medicine. The problems that may happen due to epilepsy depend on whether you take medicines for your condition during your pregnancy. Without treatment, epilepsy can put you at risk for certain problems, including:  More frequent seizures than usual.  Traumatic injuries from seizures.  An increased risk of death.  Premature labor. How does this condition affect my baby? Without treatment, epilepsy can put your baby at risk for certain  problems, including:  Premature birth.  Placenta problems.  Slower than normal growth in the uterus (intrauterine growth restriction).  Slowed heart rate and low oxygen supply to the baby. Antiepileptic medicines can affect you and your baby by increasing the risk for:  Birth defects.  Growth restriction or a baby that is small.  Complications after birth that may require surgery to correct. Most of the time, medicines are prescribed anyway because the risk of the medicines harming your baby is lower than the risk of a seizure harming your baby. If your health care provider prescribes medicine, he or she will prescribe the safest kind of medicine at the lowest dose that will still prevent seizures. You will also have frequent blood tests to make sure that the medicine is at a safe level. How is this treated? During pregnancy, treatment for epilepsy may include:  Taking a medicine that helps prevent seizures (antiepileptic drug). This medicine is commonly prescribed for women who have had seizures within several years of the pregnancy.  Creating a plan. It is best to create a plan for epilepsy before the pregnancy. Work with your health care provider and schedule prenatal appointments as soon as possible.  Keeping a seizure diary. Record what you remember about each seizure, especially anything that might have triggered the seizure. Let your health care provider know if you had a seizure.   Will I be able to breastfeed my baby? Women with epilepsy are encouraged to breastfeed. The antiepileptic drug may pass through your breast milk in small amounts and is not harmful to your baby. Talk to your baby's health care provider about medicines you are taking for seizures, as some medicines may make your baby more sleepy than usual. Follow these instructions at home:  Follow your medicine and seizure-control plans during pregnancy carefully. If you have a seizure during your pregnancy, your  risk for early delivery may increase.  Keep all follow-up visits. This is important. Contact a health care provider if:  You think you are having seizures.  You do not feel the baby moving as much as usual.  You develop tiredness or weakness.  You feel like you are going to faint. Get help right away if:  You have very bad abdominal pain.  You cannot stop vomiting.  You have vaginal bleeding.  You do not feel the baby moving.  You have very bad headaches or you have vision problems.  A part of your body feels numb. Summary  Epilepsy is a condition in which a person has repeated seizures over time.  Most women with epilepsy have normal pregnancies and healthy babies.  Follow your health care provider's recommendations about the management and treatment of epilepsy during pregnancy. This information is not intended to replace advice given to you by your health care provider. Make sure you discuss any questions you have with your health care provider. Document Revised: 10/17/2019 Document Reviewed: 10/17/2019 Elsevier Patient  Education  2021 Elsevier Inc.  Lamotrigine Morning Lamotrigine Evening Lamotrigine Keppra  Week 1 25mg   500mg  BID  Week 2 25mg  25mg  500mg  BID  Week 3 50mg  25mg  500mg  BID  Week 4 50mg  50mg  500mg  BID  Week 5 75mg  50mg  500mg  BID  Week 6 75mg  75mg  500mg  BID  Week 7 100mg  75mg  500mg  BID  Week 8 100mg  100mg  500mg  BID  Week 9 100mg  150mg  250mg  BID  Week 10 100mg  200mg  Stop Keppra

## 2020-07-15 ENCOUNTER — Ambulatory Visit: Payer: No Typology Code available for payment source

## 2020-07-21 ENCOUNTER — Ambulatory Visit: Payer: No Typology Code available for payment source | Attending: Obstetrics and Gynecology | Admitting: *Deleted

## 2020-07-21 ENCOUNTER — Ambulatory Visit (HOSPITAL_BASED_OUTPATIENT_CLINIC_OR_DEPARTMENT_OTHER): Payer: No Typology Code available for payment source

## 2020-07-21 ENCOUNTER — Encounter: Payer: Self-pay | Admitting: *Deleted

## 2020-07-21 ENCOUNTER — Other Ambulatory Visit: Payer: Self-pay

## 2020-07-21 DIAGNOSIS — O10912 Unspecified pre-existing hypertension complicating pregnancy, second trimester: Secondary | ICD-10-CM | POA: Insufficient documentation

## 2020-07-21 DIAGNOSIS — G40909 Epilepsy, unspecified, not intractable, without status epilepticus: Secondary | ICD-10-CM | POA: Insufficient documentation

## 2020-07-21 DIAGNOSIS — O99212 Obesity complicating pregnancy, second trimester: Secondary | ICD-10-CM

## 2020-07-21 DIAGNOSIS — Z3A24 24 weeks gestation of pregnancy: Secondary | ICD-10-CM

## 2020-07-21 DIAGNOSIS — Z362 Encounter for other antenatal screening follow-up: Secondary | ICD-10-CM | POA: Diagnosis not present

## 2020-07-21 DIAGNOSIS — O34219 Maternal care for unspecified type scar from previous cesarean delivery: Secondary | ICD-10-CM

## 2020-07-21 DIAGNOSIS — Z148 Genetic carrier of other disease: Secondary | ICD-10-CM | POA: Diagnosis not present

## 2020-07-21 DIAGNOSIS — O09292 Supervision of pregnancy with other poor reproductive or obstetric history, second trimester: Secondary | ICD-10-CM

## 2020-07-21 DIAGNOSIS — O99352 Diseases of the nervous system complicating pregnancy, second trimester: Secondary | ICD-10-CM | POA: Insufficient documentation

## 2020-07-21 DIAGNOSIS — E669 Obesity, unspecified: Secondary | ICD-10-CM

## 2020-07-21 DIAGNOSIS — O10012 Pre-existing essential hypertension complicating pregnancy, second trimester: Secondary | ICD-10-CM | POA: Diagnosis not present

## 2020-07-21 DIAGNOSIS — O10919 Unspecified pre-existing hypertension complicating pregnancy, unspecified trimester: Secondary | ICD-10-CM

## 2020-07-21 IMAGING — US US MFM OB FOLLOW-UP
1 series · 13 of 28 positions shown · non-contrast
Comparison: none

[Series 1: us mfm ob follow-up · 13 of 87 slices shown]
[im 4/87]
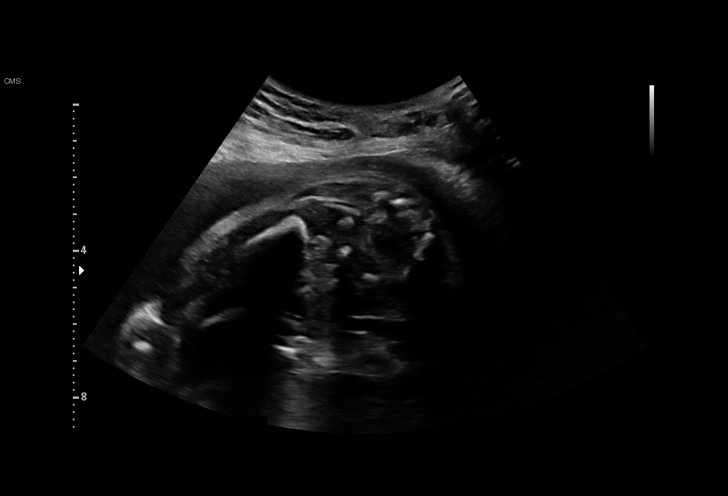
[im 10/87]
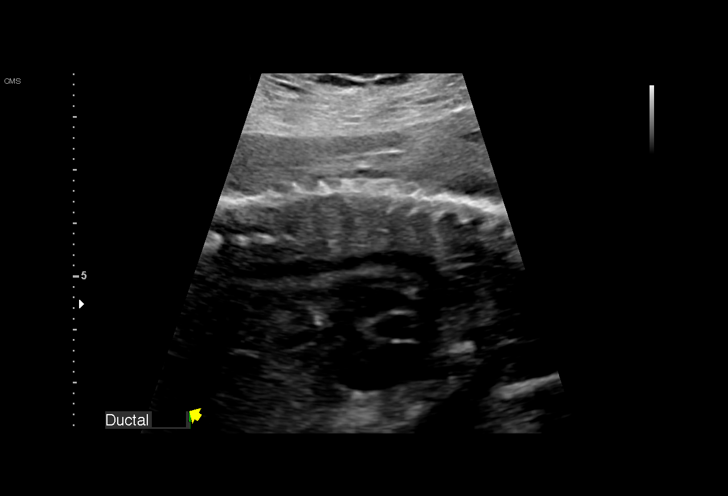
[im 16/87]
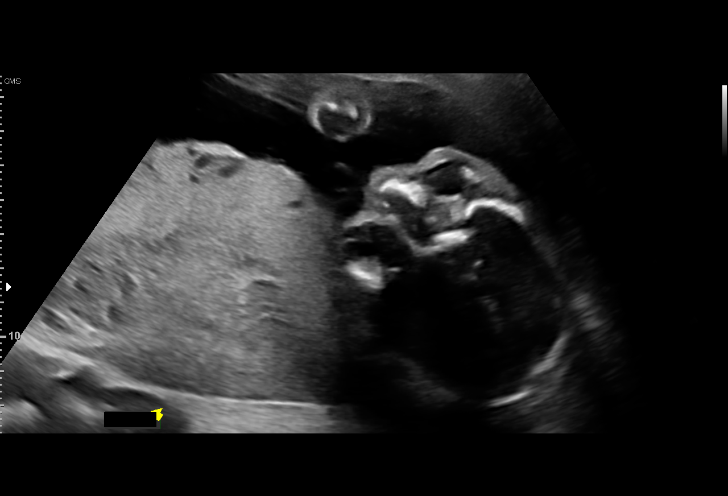
[im 23/87]
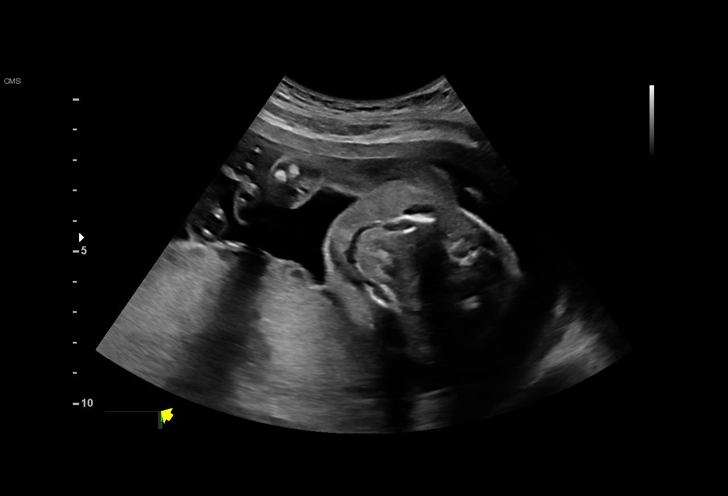
[im 29/87]
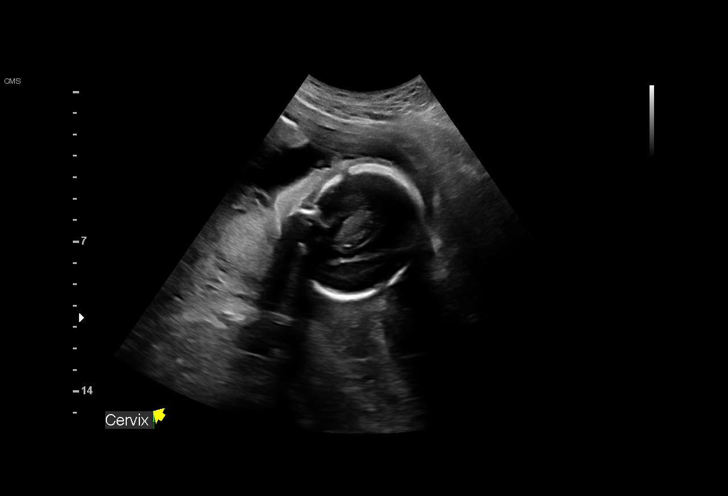
[im 36/87]
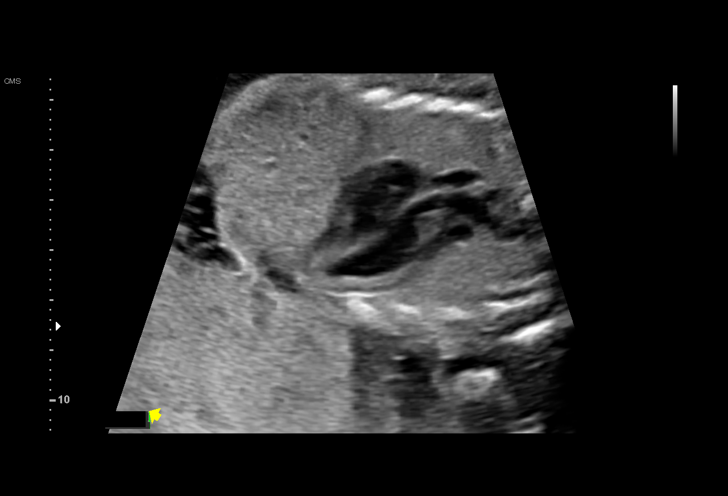
[im 45/87]
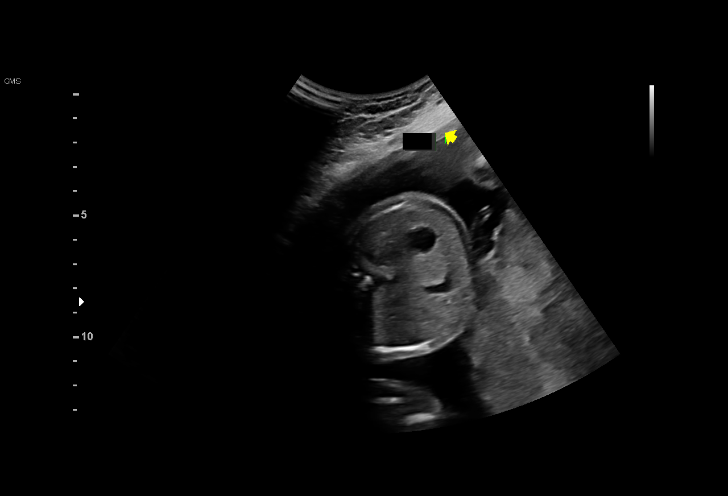
[im 51/87]
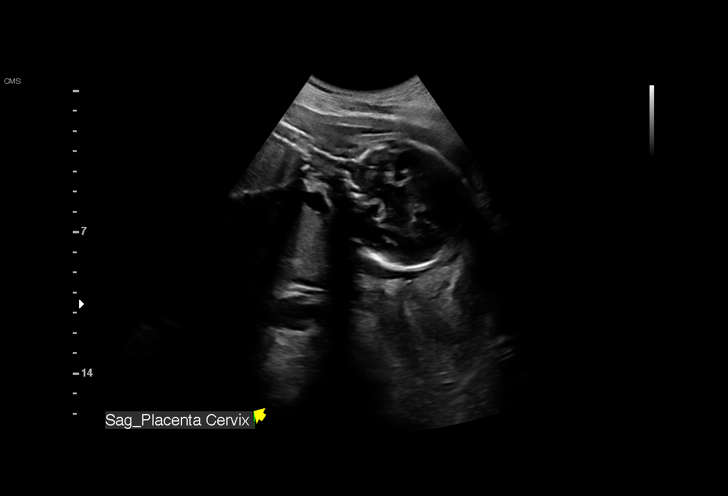
[im 58/87]
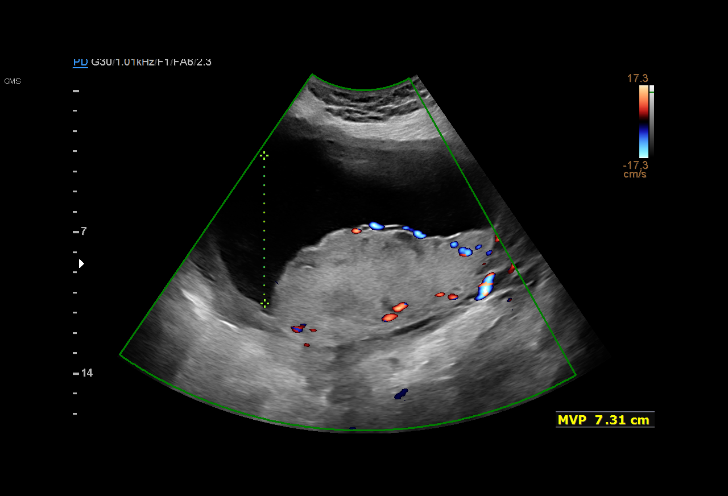
[im 64/87]
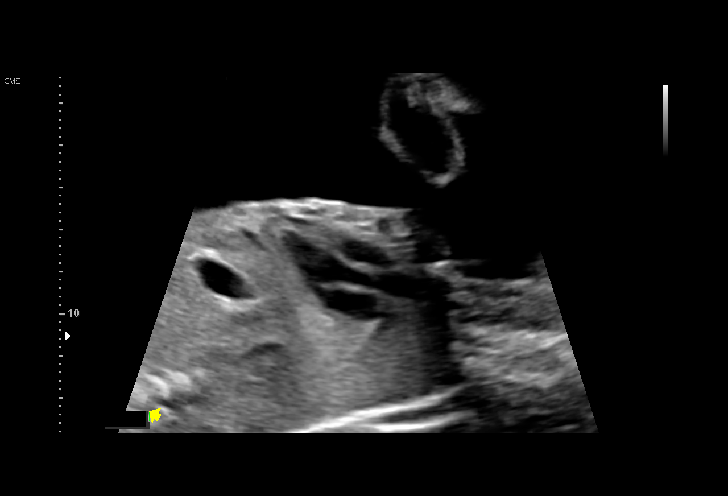
[im 71/87]
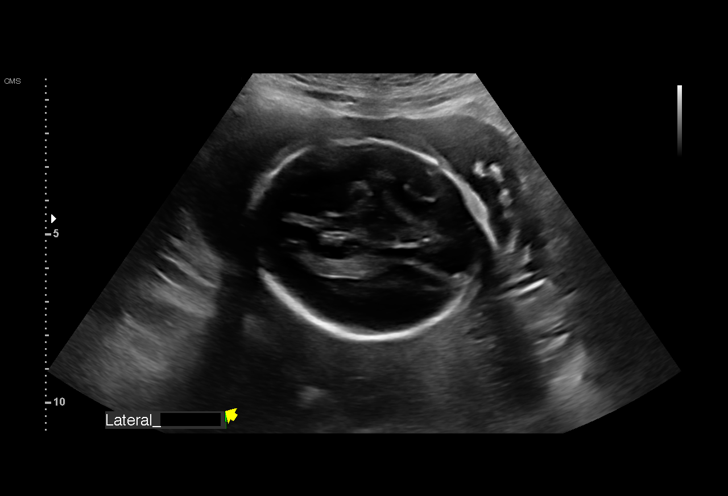
[im 77/87]
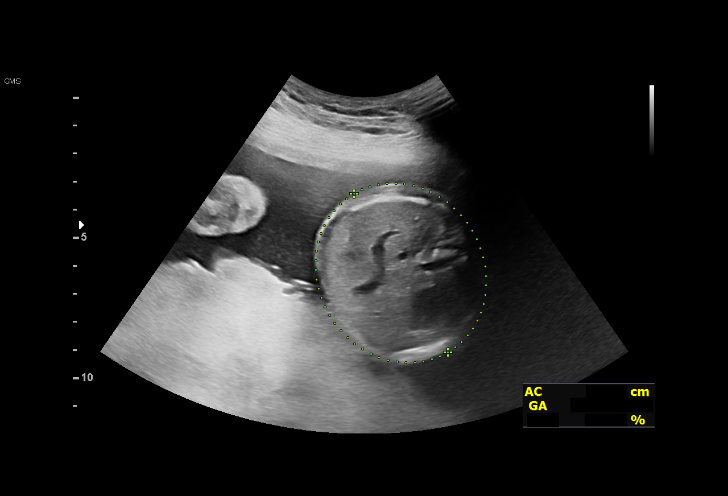
[im 83/87]
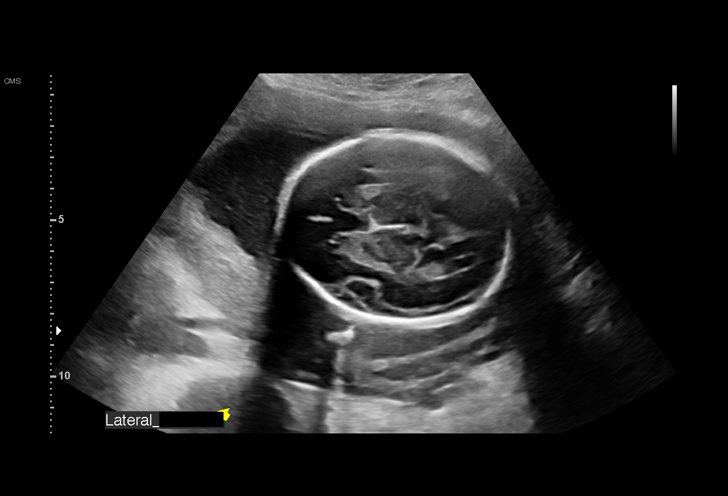

[13 of 28 positions shown; findings below may reference images not displayed]

[REDACTED]care

Indications

 24 weeks gestation of pregnancy
 Encounter for other antenatal screening        [08]
 follow-up
 Obesity complicating pregnancy, second         [08]
 trimester (BMI 32)
 History of cesarean delivery, currently        [08]
 pregnant (x3)
 Genetic carrier (Sickle Cell)                  [08]
 Hypertension - Chronic/Pre-existing (ASA)      [08]
 Seizure disorder (Lamictal, Keppra)            [08] [08]
 Poor obstetric history: Previous fetal growth  [08]
 restriction (FGR)
Fetal Evaluation

 Num Of Fetuses:         1
 Fetal Heart Rate(bpm):  164
 Cardiac Activity:       Observed
 Presentation:           Cephalic
 Placenta:               Posterior
 P. Cord Insertion:      Visualized

 Amniotic Fluid
 AFI FV:      Within normal limits

                             Largest Pocket(cm)

Biometry
 BPD:      59.3  mm     G. Age:  24w 2d         30  %    CI:        72.91   %    70 - 86
                                                         FL/HC:      19.4   %    18.7 -
 HC:      220.8  mm     G. Age:  24w 1d         16  %    HC/AC:      1.12        1.04 -
 AC:      196.7  mm     G. Age:  24w 2d         34  %    FL/BPD:     72.3   %    71 - 87
 FL:       42.9  mm     G. Age:  24w 0d         21  %    FL/AC:      21.8   %    20 - 24
 HUM:      40.7  mm     G. Age:  24w 5d         45  %
 LV:        3.8  mm

 Est. FW:     671  gm      1 lb 8 oz     25  %
OB History

 Gravidity:    7
 Living:       4
Gestational Age

 LMP:           24w 6d        Date:  [DATE]                 EDD:   [DATE]
 U/S Today:     24w 1d                                        EDD:   [DATE]
 Best:          24w 4d     Det. By:  U/S C R L  ([DATE])    EDD:   [DATE]
Anatomy

 Cranium:               Appears normal         Aortic Arch:            Appears normal
 Cavum:                 Appears normal         Ductal Arch:            Appears normal
 Ventricles:            Appears normal         Diaphragm:              Appears normal
 Choroid Plexus:        Appears normal         Stomach:                Appears normal, left
                                                                       sided
 Cerebellum:            Appears normal         Abdomen:                Appears normal
 Posterior Fossa:       Previously seen        Abdominal Wall:         Previously seen
 Nuchal Fold:           Previously seen        Cord Vessels:           Previously seen
 Face:                  Appears normal         Kidneys:                Appear normal
                        (orbits and profile)
 Lips:                  Appears normal         Bladder:                Appears normal
 Thoracic:              Appears normal         Spine:                  Previously seen
 Heart:                 Appears normal         Upper Extremities:      Appears normal
                        (4CH, axis, and
                        situs)
 RVOT:                  Appears normal         Lower Extremities:      Previously seen
 LVOT:                  Appears normal

 Other:  Fetus appears to be a male. Heels and RIGHT open hands/5th digits
         visualized. Lenses visualized. Nasal bone visualized. VC, 3VV and
         3VTV visualized.
Cervix Uterus Adnexa

 Cervix
 Length:           3.54  cm.
 Normal appearance by transabdominal scan.

 Uterus
 No abnormality visualized.

 Right Ovary
 Within normal limits.
 Left Ovary
 Within normal limits.

 Cul De Sac
 No free fluid seen.

 Adnexa
 No abnormality visualized.
Impression

 Follow up growth to complete the fetal anatomy.
 Normal interval growth with measurements consistent with
 dates
 Good fetal movement and amniotic fluid volume

 Ms. HEBER ARIEL has a known history of seizure disoder. She had a
 recent seizure on [REDACTED], again a short lived focal seizure.
 She notes that she is taking her medication regularly, but has
 not had her levels drawn recently and also has persistent
 nausea and vomiting. She is able to keep some food and
 ensure down.

 I encouraged her to request to have a her Lamictal levels
 drawn as the levels can changed during the pregnancy thus
 developing subtherapeutic levels.

 Secondly, if her N/V is persistent and not improved consider
 inpatient admission for IV hydration and IV antiemetics also a
 suppository may be of help.

 I discussed these options with Ms. HEBER ARIEL, she prefers to
 continue her current strategy with the hopes for self
 resolution.
Recommendations

 Follow up growth was scheduled in 4 weeks.

## 2020-07-22 ENCOUNTER — Other Ambulatory Visit: Payer: Self-pay | Admitting: *Deleted

## 2020-07-22 DIAGNOSIS — G40909 Epilepsy, unspecified, not intractable, without status epilepticus: Secondary | ICD-10-CM

## 2020-07-25 ENCOUNTER — Other Ambulatory Visit: Payer: Self-pay

## 2020-07-25 ENCOUNTER — Inpatient Hospital Stay (HOSPITAL_COMMUNITY)
Admission: AD | Admit: 2020-07-25 | Discharge: 2020-08-06 | DRG: 831 | Disposition: A | Discharge status: Home / Self Care | Payer: No Typology Code available for payment source | Attending: Obstetrics & Gynecology | Admitting: Obstetrics & Gynecology

## 2020-07-25 ENCOUNTER — Encounter (HOSPITAL_COMMUNITY): Payer: Self-pay | Admitting: Obstetrics and Gynecology

## 2020-07-25 DIAGNOSIS — O23592 Infection of other part of genital tract in pregnancy, second trimester: Secondary | ICD-10-CM | POA: Diagnosis present

## 2020-07-25 DIAGNOSIS — Z87891 Personal history of nicotine dependence: Secondary | ICD-10-CM | POA: Diagnosis not present

## 2020-07-25 DIAGNOSIS — Z3A25 25 weeks gestation of pregnancy: Secondary | ICD-10-CM

## 2020-07-25 DIAGNOSIS — Z79899 Other long term (current) drug therapy: Secondary | ICD-10-CM

## 2020-07-25 DIAGNOSIS — Z3A27 27 weeks gestation of pregnancy: Secondary | ICD-10-CM | POA: Diagnosis not present

## 2020-07-25 DIAGNOSIS — Z3A26 26 weeks gestation of pregnancy: Secondary | ICD-10-CM | POA: Diagnosis not present

## 2020-07-25 DIAGNOSIS — O10919 Unspecified pre-existing hypertension complicating pregnancy, unspecified trimester: Secondary | ICD-10-CM | POA: Diagnosis present

## 2020-07-25 DIAGNOSIS — Z20822 Contact with and (suspected) exposure to covid-19: Secondary | ICD-10-CM | POA: Diagnosis present

## 2020-07-25 DIAGNOSIS — D6959 Other secondary thrombocytopenia: Secondary | ICD-10-CM | POA: Diagnosis present

## 2020-07-25 DIAGNOSIS — R531 Weakness: Secondary | ICD-10-CM | POA: Diagnosis not present

## 2020-07-25 DIAGNOSIS — F4310 Post-traumatic stress disorder, unspecified: Secondary | ICD-10-CM | POA: Diagnosis present

## 2020-07-25 DIAGNOSIS — R197 Diarrhea, unspecified: Secondary | ICD-10-CM | POA: Diagnosis not present

## 2020-07-25 DIAGNOSIS — G40319 Generalized idiopathic epilepsy and epileptic syndromes, intractable, without status epilepticus: Secondary | ICD-10-CM | POA: Diagnosis present

## 2020-07-25 DIAGNOSIS — O99012 Anemia complicating pregnancy, second trimester: Secondary | ICD-10-CM | POA: Diagnosis present

## 2020-07-25 DIAGNOSIS — O219 Vomiting of pregnancy, unspecified: Secondary | ICD-10-CM | POA: Diagnosis present

## 2020-07-25 DIAGNOSIS — O99342 Other mental disorders complicating pregnancy, second trimester: Secondary | ICD-10-CM | POA: Diagnosis present

## 2020-07-25 DIAGNOSIS — O99891 Other specified diseases and conditions complicating pregnancy: Secondary | ICD-10-CM | POA: Diagnosis not present

## 2020-07-25 DIAGNOSIS — F4312 Post-traumatic stress disorder, chronic: Secondary | ICD-10-CM | POA: Diagnosis present

## 2020-07-25 DIAGNOSIS — B9689 Other specified bacterial agents as the cause of diseases classified elsewhere: Secondary | ICD-10-CM | POA: Diagnosis present

## 2020-07-25 DIAGNOSIS — O99352 Diseases of the nervous system complicating pregnancy, second trimester: Secondary | ICD-10-CM | POA: Diagnosis not present

## 2020-07-25 DIAGNOSIS — O99113 Other diseases of the blood and blood-forming organs and certain disorders involving the immune mechanism complicating pregnancy, third trimester: Secondary | ICD-10-CM | POA: Diagnosis not present

## 2020-07-25 DIAGNOSIS — F419 Anxiety disorder, unspecified: Secondary | ICD-10-CM | POA: Diagnosis present

## 2020-07-25 DIAGNOSIS — Z4659 Encounter for fitting and adjustment of other gastrointestinal appliance and device: Secondary | ICD-10-CM

## 2020-07-25 DIAGNOSIS — G43719 Chronic migraine without aura, intractable, without status migrainosus: Secondary | ICD-10-CM | POA: Diagnosis present

## 2020-07-25 DIAGNOSIS — O10012 Pre-existing essential hypertension complicating pregnancy, second trimester: Secondary | ICD-10-CM | POA: Diagnosis present

## 2020-07-25 DIAGNOSIS — R0789 Other chest pain: Secondary | ICD-10-CM

## 2020-07-25 DIAGNOSIS — N760 Acute vaginitis: Secondary | ICD-10-CM | POA: Diagnosis present

## 2020-07-25 DIAGNOSIS — D573 Sickle-cell trait: Secondary | ICD-10-CM | POA: Diagnosis present

## 2020-07-25 DIAGNOSIS — Z98891 History of uterine scar from previous surgery: Secondary | ICD-10-CM

## 2020-07-25 DIAGNOSIS — R569 Unspecified convulsions: Secondary | ICD-10-CM | POA: Diagnosis not present

## 2020-07-25 DIAGNOSIS — F32A Depression, unspecified: Secondary | ICD-10-CM | POA: Diagnosis not present

## 2020-07-25 DIAGNOSIS — O34219 Maternal care for unspecified type scar from previous cesarean delivery: Secondary | ICD-10-CM | POA: Diagnosis present

## 2020-07-25 DIAGNOSIS — Z3481 Encounter for supervision of other normal pregnancy, first trimester: Secondary | ICD-10-CM

## 2020-07-25 DIAGNOSIS — R7989 Other specified abnormal findings of blood chemistry: Secondary | ICD-10-CM | POA: Diagnosis present

## 2020-07-25 DIAGNOSIS — O212 Late vomiting of pregnancy: Secondary | ICD-10-CM | POA: Diagnosis present

## 2020-07-25 DIAGNOSIS — O99112 Other diseases of the blood and blood-forming organs and certain disorders involving the immune mechanism complicating pregnancy, second trimester: Secondary | ICD-10-CM | POA: Diagnosis present

## 2020-07-25 DIAGNOSIS — D696 Thrombocytopenia, unspecified: Secondary | ICD-10-CM | POA: Diagnosis not present

## 2020-07-25 DIAGNOSIS — G40909 Epilepsy, unspecified, not intractable, without status epilepticus: Secondary | ICD-10-CM

## 2020-07-25 HISTORY — DX: Sickle-cell trait: D57.3

## 2020-07-25 HISTORY — DX: Vomiting of pregnancy, unspecified: O21.9

## 2020-07-25 LAB — CBC WITH DIFFERENTIAL/PLATELET
Abs Immature Granulocytes: 0.06 10*3/uL (ref 0.00–0.07)
Basophils Absolute: 0.1 10*3/uL (ref 0.0–0.1)
Basophils Relative: 1 %
Eosinophils Absolute: 0.1 10*3/uL (ref 0.0–0.5)
Eosinophils Relative: 1 %
HCT: 33.5 % — ABNORMAL LOW (ref 36.0–46.0)
Hemoglobin: 11.2 g/dL — ABNORMAL LOW (ref 12.0–15.0)
Immature Granulocytes: 1 %
Lymphocytes Relative: 26 %
Lymphs Abs: 1.9 10*3/uL (ref 0.7–4.0)
MCH: 30.3 pg (ref 26.0–34.0)
MCHC: 33.4 g/dL (ref 30.0–36.0)
MCV: 90.5 fL (ref 80.0–100.0)
Monocytes Absolute: 0.6 10*3/uL (ref 0.1–1.0)
Monocytes Relative: 8 %
Neutro Abs: 4.8 10*3/uL (ref 1.7–7.7)
Neutrophils Relative %: 63 %
Platelets: 221 10*3/uL (ref 150–400)
RBC: 3.7 MIL/uL — ABNORMAL LOW (ref 3.87–5.11)
RDW: 12.3 % (ref 11.5–15.5)
WBC: 7.6 10*3/uL (ref 4.0–10.5)
nRBC: 0 % (ref 0.0–0.2)

## 2020-07-25 LAB — COMPREHENSIVE METABOLIC PANEL
ALT: 27 U/L (ref 0–44)
AST: 17 U/L (ref 15–41)
Albumin: 3.1 g/dL — ABNORMAL LOW (ref 3.5–5.0)
Alkaline Phosphatase: 70 U/L (ref 38–126)
Anion gap: 7 (ref 5–15)
BUN: <5 mg/dL — ABNORMAL LOW (ref 6–20)
CO2: 21 mmol/L — ABNORMAL LOW (ref 22–32)
Calcium: 9.4 mg/dL (ref 8.9–10.3)
Chloride: 108 mmol/L (ref 98–111)
Creatinine, Ser: 0.71 mg/dL (ref 0.44–1.00)
GFR, Estimated: >60 mL/min (ref >60)
Glucose, Bld: 101 mg/dL — ABNORMAL HIGH (ref 70–99)
Potassium: 3.5 mmol/L (ref 3.5–5.1)
Sodium: 136 mmol/L (ref 135–145)
Total Bilirubin: 0.4 mg/dL (ref 0.3–1.2)
Total Protein: 6.7 g/dL (ref 6.5–8.1)

## 2020-07-25 LAB — URINALYSIS, ROUTINE W REFLEX MICROSCOPIC
Bilirubin Urine: NEGATIVE
Glucose, UA: NEGATIVE mg/dL
Hgb urine dipstick: NEGATIVE
Ketones, ur: 20 mg/dL — AB
Nitrite: NEGATIVE
Protein, ur: NEGATIVE mg/dL
Specific Gravity, Urine: 1.015 (ref 1.005–1.030)
pH: 5 (ref 5.0–8.0)

## 2020-07-25 LAB — TYPE AND SCREEN
ABO/RH(D): A POS
Antibody Screen: NEGATIVE

## 2020-07-25 LAB — RESP PANEL BY RT-PCR (FLU A&B, COVID) ARPGX2
Influenza A by PCR: NEGATIVE
Influenza B by PCR: NEGATIVE
SARS Coronavirus 2 by RT PCR: NEGATIVE

## 2020-07-25 MED ORDER — DOCUSATE SODIUM 100 MG PO CAPS: 100.0000 mg | ORAL_CAPSULE | Freq: Every day | ORAL | Status: DC

## 2020-07-25 MED ORDER — CALCIUM CARBONATE ANTACID 500 MG PO CHEW: 2.0000 | CHEWABLE_TABLET | ORAL | Status: DC | PRN

## 2020-07-25 MED ORDER — LAMOTRIGINE 25 MG PO TABS
25.0000 mg | ORAL_TABLET | Freq: Every morning | ORAL | Status: DC
  Filled 2020-07-25: qty 1

## 2020-07-25 MED ORDER — FAMOTIDINE 20 MG PO TABS
20.0000 mg | ORAL_TABLET | Freq: Two times a day (BID) | ORAL | Status: DC
  Administered 2020-07-27 – 2020-08-06 (×4): 20 mg via ORAL
  Filled 2020-07-25 (×7): qty 1

## 2020-07-25 MED ORDER — HYDROXYZINE HCL 50 MG/ML IM SOLN
50.0000 mg | Freq: Four times a day (QID) | INTRAMUSCULAR | Status: DC | PRN
  Administered 2020-07-26 – 2020-07-27 (×3): 50 mg via INTRAMUSCULAR
  Filled 2020-07-25 (×4): qty 1

## 2020-07-25 MED ORDER — ONDANSETRON 4 MG PO TBDP: 4.0000 mg | ORAL_TABLET | Freq: Three times a day (TID) | ORAL | Status: DC | PRN

## 2020-07-25 MED ORDER — ONDANSETRON HCL 4 MG/2ML IJ SOLN: 4.0000 mg | Freq: Three times a day (TID) | INTRAMUSCULAR | Status: DC | PRN

## 2020-07-25 MED ORDER — FAMOTIDINE IN NACL 20-0.9 MG/50ML-% IV SOLN
20.0000 mg | Freq: Once | INTRAVENOUS | Status: AC
  Administered 2020-07-25: 20 mg via INTRAVENOUS
  Filled 2020-07-25: qty 50

## 2020-07-25 MED ORDER — ACETAMINOPHEN 10 MG/ML IV SOLN
1000.0000 mg | Freq: Once | INTRAVENOUS | Status: AC
  Administered 2020-07-25: 1000 mg via INTRAVENOUS
  Filled 2020-07-25: qty 100

## 2020-07-25 MED ORDER — SCOPOLAMINE 1 MG/3DAYS TD PT72
1.0000 | MEDICATED_PATCH | TRANSDERMAL | Status: DC
  Administered 2020-07-25 – 2020-08-03 (×4): 1.5 mg via TRANSDERMAL
  Filled 2020-07-25 (×4): qty 1

## 2020-07-25 MED ORDER — SODIUM CHLORIDE 0.9 % IV SOLN
8.0000 mg | Freq: Three times a day (TID) | INTRAVENOUS | Status: DC | PRN
  Filled 2020-07-25: qty 4

## 2020-07-25 MED ORDER — HYDROXYZINE HCL 50 MG PO TABS: 50.0000 mg | ORAL_TABLET | Freq: Four times a day (QID) | ORAL | Status: DC | PRN

## 2020-07-25 MED ORDER — LEVETIRACETAM IN NACL 500 MG/100ML IV SOLN
500.0000 mg | Freq: Two times a day (BID) | INTRAVENOUS | Status: DC
  Administered 2020-07-25 – 2020-08-05 (×23): 500 mg via INTRAVENOUS
  Filled 2020-07-25 (×26): qty 100

## 2020-07-25 MED ORDER — ZOLPIDEM TARTRATE 5 MG PO TABS
5.0000 mg | ORAL_TABLET | Freq: Every evening | ORAL | Status: DC | PRN
  Administered 2020-07-26: 5 mg via ORAL
  Filled 2020-07-25: qty 1

## 2020-07-25 MED ORDER — FAMOTIDINE IN NACL 20-0.9 MG/50ML-% IV SOLN
20.0000 mg | Freq: Two times a day (BID) | INTRAVENOUS | Status: DC
  Administered 2020-07-25 – 2020-08-04 (×20): 20 mg via INTRAVENOUS
  Filled 2020-07-25 (×20): qty 50

## 2020-07-25 MED ORDER — SODIUM CHLORIDE 0.9 % IV SOLN
8.0000 mg | Freq: Three times a day (TID) | INTRAVENOUS | Status: DC
  Administered 2020-07-25 – 2020-08-06 (×34): 8 mg via INTRAVENOUS
  Filled 2020-07-25 (×42): qty 4

## 2020-07-25 MED ORDER — ACETAMINOPHEN 10 MG/ML IV SOLN
650.0000 mg | Freq: Four times a day (QID) | INTRAVENOUS | Status: DC | PRN
  Filled 2020-07-25: qty 65

## 2020-07-25 MED ORDER — LACTATED RINGERS IV BOLUS
1000.0000 mL | Freq: Once | INTRAVENOUS | Status: AC
  Administered 2020-07-25: 1000 mL via INTRAVENOUS

## 2020-07-25 MED ORDER — PRENATAL MULTIVITAMIN CH: 1.0000 | ORAL_TABLET | Freq: Every day | ORAL | Status: DC

## 2020-07-25 MED ORDER — LACTATED RINGERS IV SOLN: INTRAVENOUS | Status: DC

## 2020-07-25 MED ORDER — ONDANSETRON HCL 4 MG/2ML IJ SOLN
4.0000 mg | Freq: Once | INTRAMUSCULAR | Status: AC
  Administered 2020-07-25: 4 mg via INTRAVENOUS
  Filled 2020-07-25: qty 2

## 2020-07-25 MED ORDER — ACETAMINOPHEN 325 MG PO TABS: 650.0000 mg | ORAL_TABLET | ORAL | Status: DC | PRN

## 2020-07-25 MED ORDER — ACETAMINOPHEN 650 MG RE SUPP
650.0000 mg | RECTAL | Status: DC | PRN
  Filled 2020-07-25: qty 1

## 2020-07-25 NOTE — MAU Note (Signed)
Leah Shea is a 35 y.o. at [redacted]w[redacted]d here in MAU reporting: nausea and vomiting since Tuesday. States 3 episodes of vomiting today. Having some cramping and spotting. Reports she has tried to take her nausea meds vaginally so unsure if spotting is from that. No LOF. +FM. Has not been able to take seizure medication due to emesis and her face feels tight so shes not sure if shes having a seizure.  Onset of complaint: ongoing  Pain score: 6/10  Vitals:   07/25/20 1414  BP: 122/77  Pulse: (!) 103  Resp: 18  Temp: 99 F (37.2 C)  SpO2: 96%     FHT: EFM applied  Lab orders placed from triage: UA

## 2020-07-25 NOTE — Progress Notes (Signed)
Breast reduction 2013 Tonsillectomy 2016 Pilonidal cyst drainage 2020 Benign tumor removed 2004

## 2020-07-25 NOTE — H&P (Signed)
History     CSN: [id number]  Arrival date and time: 07/25/20 1358   Event Date/Time   First Provider Initiated Contact with Patient 07/25/20 1419      Chief Complaint  Patient presents with   Abdominal Pain   Vaginal Bleeding   Nausea   Emesis   HPI Leah Shea is a 35 y.o. [id number] at 43w3dwho presents with nausea and vomiting. She reports for the last 4 days she has been unable to keep anything down. She has been vomiting non stop. She reports taking her antiemetics per vagina but that is not helping. She states she has not been able to take her seizure medications because of the vomiting. She was in the middle of her 10 week taper. She states she does not think she has had a seizure at home but reports her regular aura of vision changes and headache. She also reports feeling like her face and jaw are "tight" and she is concerned that this means she may have a seizure soon. She reports some spotting that she attributes to the vaginal use of her antiemetics. She denies any leaking or discharge. She reports normal fetal movement.   OB History     Gravida  7   Para  4   Term  2   Preterm  2   AB  2   Living  4      SAB  2   IAB      Ectopic      Multiple      Live Births  4           Past Medical History:  Diagnosis Date   Anemia    Anxiety    Asthma    Blood dyscrasia    sickle trait   Bronchitis, acute    Chronic hypertension affecting pregnancy 04/30/2020   Complex partial seizures ([facility] [place]   Depression    Diverticular disease    G6PD deficiency    Headache(784.0)    migraines   PTSD (post-traumatic stress disorder)    Shortness of breath    with exertion or panic attack   Sleep apnea    [facility]   Past Surgical History:  Procedure Laterality Date   BREAST SURGERY Bilateral    reductions   CESAREAN SECTION  2010, 2012,2014   x 3   EYE SURGERY     x 10 as a child   PILONIDAL CYST EXCISION   2020   TONSILLECTOMY N/A 10/07/2013   Procedure: TONSILLECTOMY;  Surgeon: DMelida Quitter MD;  [facility]  Service: ENT;  Laterality: N/A;   TUMOR REMOVAL     tumor removed from back    Family History  Problem Relation Age of Onset   Breast cancer Mother    Breast cancer Maternal Aunt 36       metastatic   Cancer Maternal Uncle        unk type   Breast cancer Maternal Grandmother    Prostate cancer Maternal Grandfather        metastatic   Cancer Maternal Aunt        either breast or ovarian     Social History   Tobacco Use   Smoking status: Former    Packs/day: 0.25    Years: 1.00    Pack years: 0.25    Types: Cigarettes    Quit date: 07/02/2013    Years  since quitting: 7.0   Smokeless tobacco: Never  Vaping Use   Vaping Use: Never used  Substance Use Topics   Alcohol use: Not Currently   Drug use: Not Currently    Types: Marijuana    Comment: last used January 3    Allergies:  Allergies  Allergen Reactions   Other Anaphylaxis and Hives    Mushrooms   Nsaids Hives, Swelling and Other (See Comments)    Body burns    Reglan [Metoclopramide] Other (See Comments)    anxiety   Sulfa Antibiotics Other (See Comments)    burns    Medications Prior to Admission  Medication Sig Dispense Refill Last Dose   acetaminophen (TYLENOL) 500 MG tablet Take 2 tablets (1,000 mg total) by mouth every 8 (eight) hours as needed for moderate pain or fever. 30 tablet 0    Blood Pressure Monitoring (BLOOD PRESSURE KIT) DEVI 1 kit by Does not apply route once a week. 1 each 0    busPIRone (BUSPAR) 15 MG tablet Take by mouth.      diphenhydrAMINE (BENADRYL) 50 MG tablet Take 0.5 tablets (25 mg total) by mouth at bedtime as needed for sleep. 30 tablet 0    Doxylamine-Pyridoxine (DICLEGIS) 10-10 MG TBEC Take 2 tablets by mouth at bedtime. If symptoms persist, add one tablet in the morning and one in the afternoon 100 tablet 5    escitalopram (LEXAPRO) 10 MG tablet Take by mouth.       folic acid (FOLVITE) 1 MG tablet Take 1 tablet (1 mg total) by mouth daily. 30 tablet 10    lamoTRIgine (LAMICTAL) 25 MG tablet Take 1 tablet (25 mg total) by mouth as directed. 56 tablet 0    levETIRAcetam (KEPPRA) 500 MG tablet Take 1 tablet (500 mg total) by mouth every 12 (twelve) hours. 112 tablet 0    ondansetron (ZOFRAN ODT) 8 MG disintegrating tablet Take 1 tablet (8 mg total) by mouth every 8 (eight) hours as needed for nausea or vomiting. 20 tablet 1    pantoprazole (PROTONIX) 40 MG tablet Take 1 tablet (40 mg total) by mouth daily. 30 tablet 6    promethazine (PHENERGAN) 25 MG tablet Take 1 tablet (25 mg total) by mouth every 6 (six) hours as needed for nausea or vomiting. 30 tablet 0    scopolamine (TRANSDERM-SCOP) 1 MG/3DAYS Place 1 patch (1.5 mg total) onto the skin every 3 (three) days. 10 patch 12     Review of Systems  Constitutional:  Positive for fatigue. Negative for fever.  HENT: Negative.    Eyes:  Positive for visual disturbance.  Respiratory: Negative.  Negative for shortness of breath.   Cardiovascular: Negative.  Negative for chest pain.  Gastrointestinal: Negative.  Negative for abdominal pain, constipation, diarrhea, nausea and vomiting.  Genitourinary:  Positive for vaginal bleeding. Negative for dysuria and vaginal discharge.  Neurological:  Positive for headaches. Negative for dizziness.  Physical Exam   Blood pressure 122/77, pulse (!) 103, temperature 99 F (37.2 C), temperature source Oral, resp. rate 18, last menstrual period 01/29/2020, SpO2 96 %.  Physical Exam Vitals and nursing note reviewed.  Constitutional:      General: She is not in acute distress.    Appearance: She is well-developed.  HENT:     Head: Normocephalic.  Eyes:     Pupils: Pupils are equal, round, and reactive to light.  Cardiovascular:     Rate and Rhythm: Normal rate and regular rhythm.  Heart sounds: Normal heart sounds.  Pulmonary:     Effort: Pulmonary effort is  normal. No respiratory distress.     Breath sounds: Normal breath sounds.  Abdominal:     General: Bowel sounds are normal. There is no distension.     Palpations: Abdomen is soft.     Tenderness: There is no abdominal tenderness.  Skin:    General: Skin is warm and dry.  Neurological:     General: No focal deficit present.     Mental Status: She is alert and oriented to person, place, and time.     Cranial Nerves: No cranial nerve deficit.     Sensory: No sensory deficit.     Motor: No weakness, abnormal muscle tone or seizure activity.  Psychiatric:        Mood and Affect: Mood normal.        Behavior: Behavior normal.        Thought Content: Thought content normal.        Judgment: Judgment normal.   Fetal Tracing:  Baseline: 150 Variability: moderate Accels: none Decels: none  Toco: none   MAU Course  Procedures  MDM UA CBC, CMP LR bolus Zofran IV Pepcid IV COVID panel  Consulted with Dr. Elgie Congo regarding presentation and results- recommends admission to [facility] for further management of hyperemesis and formal consult with neurology to manage seizure disorder  Assessment and Plan  -Hyperemesis -Seizure disorder  -Admit to [place] turned over to MD  [place] 07/25/2020, 2:19 PM

## 2020-07-26 DIAGNOSIS — O219 Vomiting of pregnancy, unspecified: Secondary | ICD-10-CM

## 2020-07-26 LAB — BASIC METABOLIC PANEL
Anion gap: 7 (ref 5–15)
BUN: <5 mg/dL — ABNORMAL LOW (ref 6–20)
CO2: 21 mmol/L — ABNORMAL LOW (ref 22–32)
Calcium: 8.6 mg/dL — ABNORMAL LOW (ref 8.9–10.3)
Chloride: 108 mmol/L (ref 98–111)
Creatinine, Ser: 0.69 mg/dL (ref 0.44–1.00)
GFR, Estimated: >60 mL/min (ref >60)
Glucose, Bld: 85 mg/dL (ref 70–99)
Potassium: 3.3 mmol/L — ABNORMAL LOW (ref 3.5–5.1)
Sodium: 136 mmol/L (ref 135–145)

## 2020-07-26 MED ORDER — LOPERAMIDE HCL 2 MG PO CAPS
2.0000 mg | ORAL_CAPSULE | ORAL | Status: DC | PRN
  Administered 2020-07-26: 2 mg via ORAL
  Filled 2020-07-26: qty 1

## 2020-07-26 MED ORDER — PYRIDOXINE HCL 100 MG/ML IJ SOLN
100.0000 mg | Freq: Every day | INTRAMUSCULAR | Status: DC
  Administered 2020-07-26 – 2020-08-05 (×11): 100 mg via INTRAVENOUS
  Filled 2020-07-26 (×13): qty 1

## 2020-07-26 MED ORDER — SODIUM CHLORIDE 0.9 % IV SOLN
25.0000 mg | Freq: Four times a day (QID) | INTRAVENOUS | Status: DC
  Administered 2020-07-26 – 2020-08-06 (×45): 25 mg via INTRAVENOUS
  Filled 2020-07-26 (×10): qty 1
  Filled 2020-07-26: qty 25
  Filled 2020-07-26 (×39): qty 1
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
## 2020-07-27 ENCOUNTER — Inpatient Hospital Stay (HOSPITAL_COMMUNITY): Payer: No Typology Code available for payment source

## 2020-07-27 ENCOUNTER — Encounter: Payer: Self-pay | Admitting: Obstetrics and Gynecology

## 2020-07-27 ENCOUNTER — Encounter (HOSPITAL_COMMUNITY): Payer: Self-pay | Admitting: Obstetrics and Gynecology

## 2020-07-27 DIAGNOSIS — O219 Vomiting of pregnancy, unspecified: Secondary | ICD-10-CM | POA: Diagnosis not present

## 2020-07-27 DIAGNOSIS — Z87898 Personal history of other specified conditions: Secondary | ICD-10-CM | POA: Insufficient documentation

## 2020-07-27 DIAGNOSIS — G40319 Generalized idiopathic epilepsy and epileptic syndromes, intractable, without status epilepticus: Secondary | ICD-10-CM | POA: Diagnosis not present

## 2020-07-27 LAB — RAPID URINE DRUG SCREEN, HOSP PERFORMED
Amphetamines: NOT DETECTED
Barbiturates: NOT DETECTED
Benzodiazepines: NOT DETECTED
Cocaine: NOT DETECTED
Opiates: NOT DETECTED
Tetrahydrocannabinol: POSITIVE — AB

## 2020-07-27 LAB — BASIC METABOLIC PANEL
Anion gap: 4 — ABNORMAL LOW (ref 5–15)
BUN: <5 mg/dL — ABNORMAL LOW (ref 6–20)
CO2: 22 mmol/L (ref 22–32)
Calcium: 8.8 mg/dL — ABNORMAL LOW (ref 8.9–10.3)
Chloride: 112 mmol/L — ABNORMAL HIGH (ref 98–111)
Creatinine, Ser: 0.75 mg/dL (ref 0.44–1.00)
GFR, Estimated: >60 mL/min (ref >60)
Glucose, Bld: 82 mg/dL (ref 70–99)
Potassium: 3.5 mmol/L (ref 3.5–5.1)
Sodium: 138 mmol/L (ref 135–145)

## 2020-07-27 LAB — TROPONIN I (HIGH SENSITIVITY)
Troponin I (High Sensitivity): 13 ng/L (ref <18)
Troponin I (High Sensitivity): 4 ng/L (ref <18)

## 2020-07-27 LAB — MAGNESIUM: Magnesium: 1.7 mg/dL (ref 1.7–2.4)

## 2020-07-27 IMAGING — DX DG CHEST 1V PORT
1 series · 1 of 1 positions shown · non-contrast
Comparison: [DATE].

CLINICAL DATA: Chest pressure, pregnant.

EXAM:
PORTABLE CHEST 1 VIEW

[chest ap]
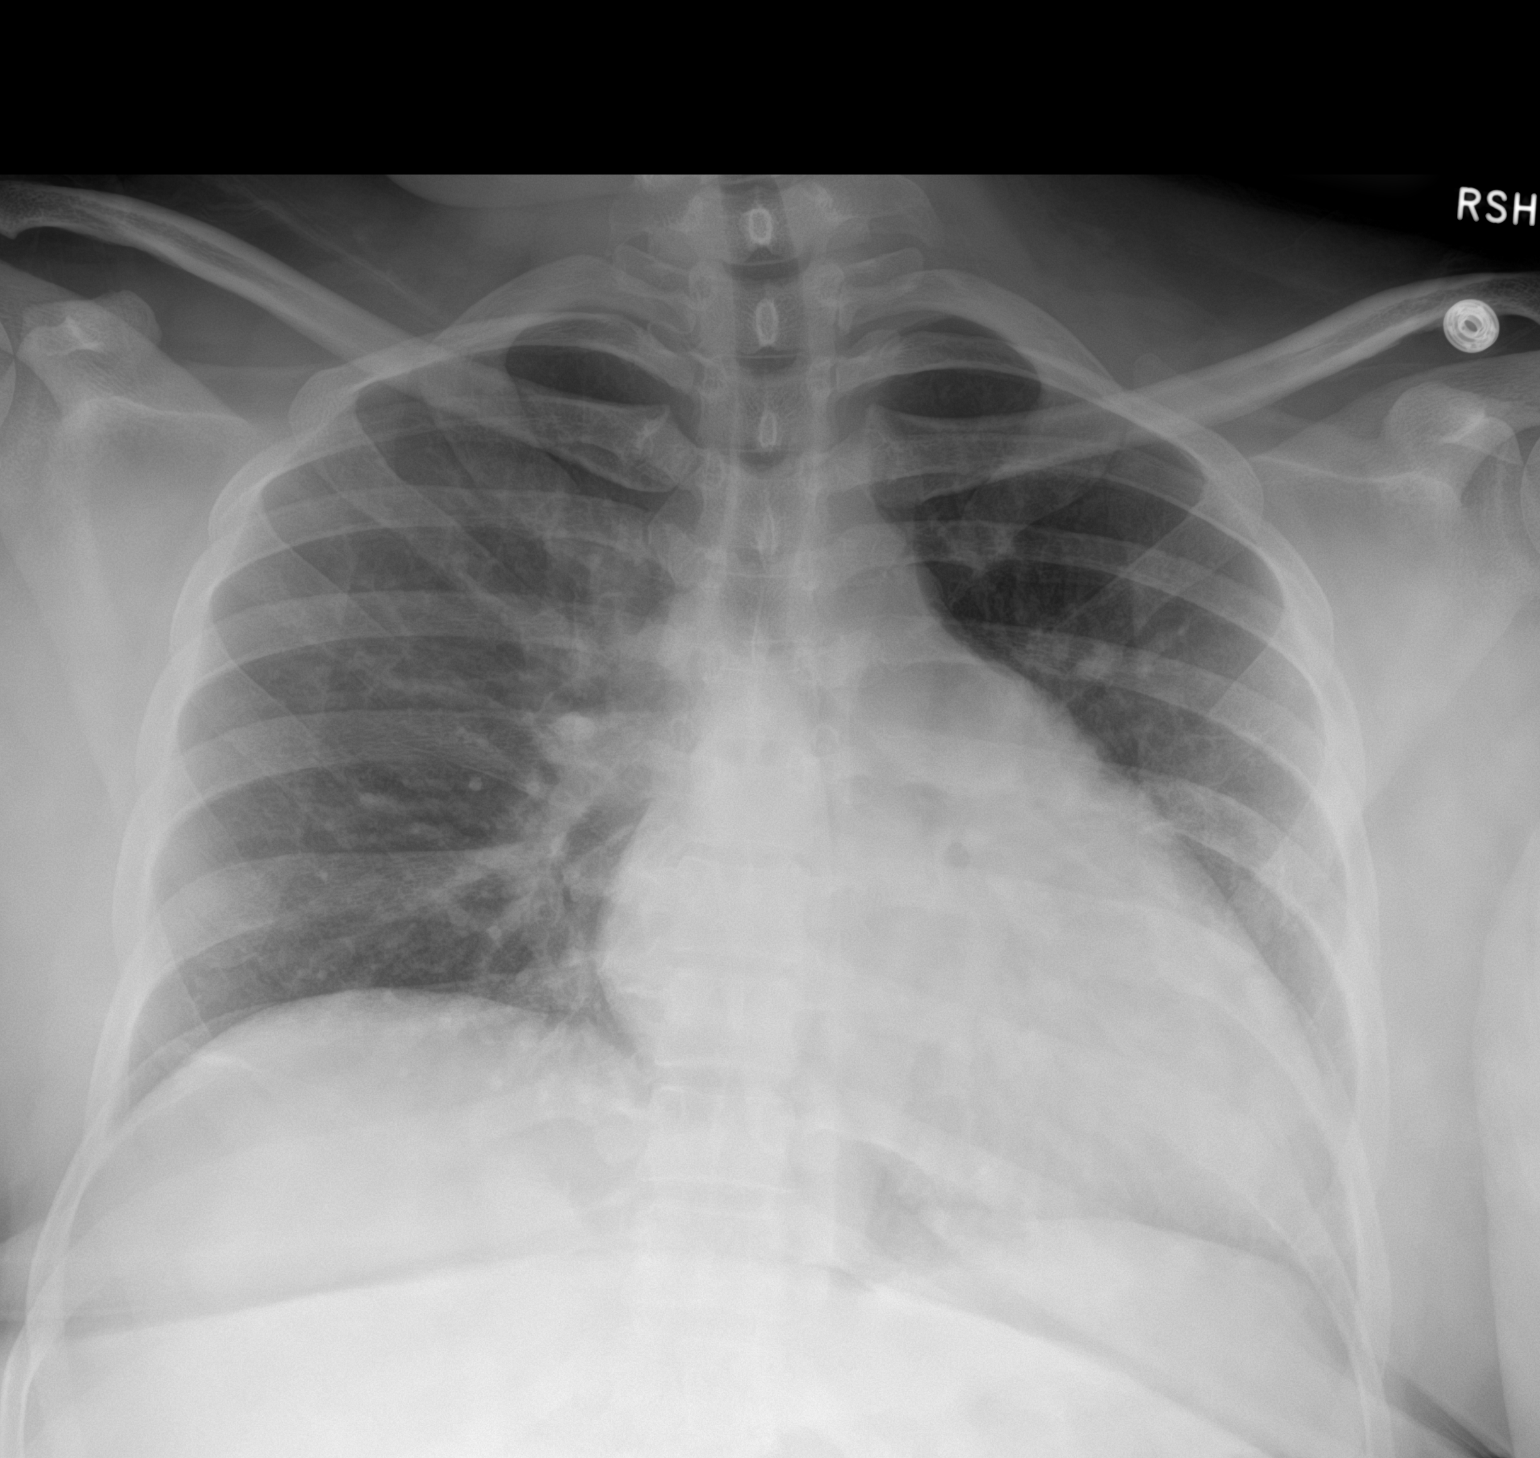

[1 of 1 positions shown; findings below may reference images not displayed]

FINDINGS: Trachea is midline. Heart size is accentuated by AP semi upright
technique. Central pulmonary markings are accentuated by low lung
volumes. No airspace consolidation or pleural fluid.
IMPRESSION: No acute findings.

## 2020-07-27 MED ORDER — DIPHENHYDRAMINE HCL 25 MG PO CAPS: 25.0000 mg | ORAL_CAPSULE | Freq: Three times a day (TID) | ORAL | Status: DC

## 2020-07-27 MED ORDER — ENOXAPARIN SODIUM 60 MG/0.6ML IJ SOSY
50.0000 mg | PREFILLED_SYRINGE | INTRAMUSCULAR | Status: DC
  Administered 2020-07-27 – 2020-08-06 (×11): 50 mg via SUBCUTANEOUS
  Filled 2020-07-27 (×11): qty 0.6
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
## 2020-07-27 NOTE — Progress Notes (Signed)
OB Note I spoke to [facility] Neuro NP Cindie Laroche and she recommended I call Dr. Otelia Limes for consultation.   I spoke to him and I told him that since she is still having residual neuro s/s such as left sided weakenss, visual and taste changes even though she said she said she had a seizure yesterday (that she did not report to anyone until I saw her on rounds today).   He recommended getting a thiamine level and he will see the patient  Cornelia Copa MD Attending Center for [facility] (Faculty Practice) 07/27/2020 Time: [id number]

## 2020-07-27 NOTE — Progress Notes (Signed)
Dr. Vergie Living notified of change in patient status: Gross motor movements, voice, and eye opening sluggish. Patient reports chest pressure 4/10 on a 10 point scale.  EKG obtained at time of complaints incidentally. Awaiting CXR and troponin draw.

## 2020-07-27 NOTE — Consult Note (Signed)
Neurology Consultation  Reason for Consult: Seizure in pregnancy  Referring Physician: Dr. Vergie Living  CC: Seizure in pregnancy with hyperemesis gravidarum and inability to tolerate PO seizure medications  History is obtained from: Chart review, Patient  HPI: Leah Shea is a 35 y.o. female who is [redacted] weeks pregnant with a medical history significant for bronchitis, hypertension, sleep apnea, complex partial seizures, and migraine headaches who presented to the hospital 6/11 for evaluation of nausea and vomiting in pregnancy. Prior to hospitalization, she states that she has been unable to keep food and medications down for at least 4 days with constant emesis. She was concerned that she was unable to keep her seizure medications down as she was titrating up her lamotrigine and tapering down her Keppra doses as recommended on neurology evaluation 07/10/2020; she states she was only approximately 1/2 week into the first titration/tapering step (25 mg lamotrigine and 500 mg Keppra BID). She had been taking her oral AEDs vaginally for approximately 2 - 3 days prior to hospitalization due to her inability to tolerate oral intake, and felt that this route might result in some absorption despite there being no Pharmacological benefit from this.    Leah Shea states that she believes that she had a seizure last night because she had texted her husband stating that she felt "off" when she laid down for bed and does not recall any more events until she woke up this morning. Since then she has had persistent left hemibody hyperasthesias, numbness, and weakness along with lethargy and persistent diplopia today. She states that her seizure intensity has increased as a result of being unable to take her seizure medications as prescribed.  Leah Shea states that she has auras at home including black spots in her vision, smelling feces, and then family has reported patient staring off. She states that she also has  periods of time that she loses; afterwards she remembers laying in the bed and "coming to" without recollection of events before awakening' she attributes these events to seizures. She states that symptoms of post-ictal fatigue, left-sided hyperasthesia and numbness, diplopia, and black spots can remain anywhere from a few hours to a few days following a seizure.   On chart review, Leah Shea has been on lamotrigine and Keppra for partial seizures since 2019 - lamotrigine possibly used for mood stabilization as well. There are no EEGs available to review on patient's chart from past hospital visits or outpatient visits. Her outpatient neurologist is Dr. Patria Mane with the [place]. Leah Shea states that she was taken off Keppra for lamotrigine monotherapy when she became pregnant due to her provider's preference for this AED's safety profile in pregnancy. She states that at baseline she experiences approximately 3 seizures per week.   ROS: A complete ROS was performed and is negative except as noted in the HPI.   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   Transferred from DOD/[facility] problem list   PTSD (post-traumatic stress disorder)    Shortness of breath    with exertion or panic attack   Sickle cell trait (HCC)    Sleep apnea    awaiting a sleep study to  be done at [facility]   Past Surgical History:  Procedure Laterality Date   BREAST SURGERY Bilateral    reductions   CESAREAN SECTION  2010, 2012,2014   x 3   EYE SURGERY     x 10 as a child   PILONIDAL CYST EXCISION  2020   TONSILLECTOMY N/A 10/07/2013   Procedure: TONSILLECTOMY;  Surgeon: Christia Reading, MD;  Location: [facility] OR;  Service: ENT;  Laterality: N/A;   TUMOR REMOVAL     tumor  removed from back   Family History  Problem Relation Age of Onset   Breast cancer Mother    Breast cancer Maternal Aunt 36       metastatic   Cancer Maternal Uncle        unk type   Breast cancer Maternal Grandmother    Prostate cancer Maternal Grandfather        metastatic   Cancer Maternal Aunt        either breast or ovarian    Social History:   reports that she quit smoking about 7 years ago. Her smoking use included cigarettes. She has a 0.25 pack-year smoking history. She has never used smokeless tobacco. She reports previous alcohol use. She reports previous drug use. Drug: Marijuana.  Medications  Current Facility-Administered Medications:    acetaminophen (TYLENOL) suppository 650 mg, 650 mg, Rectal, Q4H PRN, Warden Fillers, MD   acetaminophen (TYLENOL) tablet 650 mg, 650 mg, Oral, Q4H PRN, Warden Fillers, MD   enoxaparin (LOVENOX) injection 50 mg, 50 mg, Subcutaneous, Q24H, Rockwood Bing, MD, 50 mg at 07/27/20 1041   famotidine (PEPCID) tablet 20 mg, 20 mg, Oral, Q12H, 20 mg at 07/27/20 0919 **OR** famotidine (PEPCID) IVPB 20 mg premix, 20 mg, Intravenous, Q12H, Warden Fillers, MD, Last Rate: 100 mL/hr at 07/26/20 2307, 20 mg at 07/26/20 2307   lactated ringers infusion, , Intravenous, Continuous, Warden Fillers, MD, Last Rate: 100 mL/hr at 07/27/20 1038, New Bag at 07/27/20 1038   levETIRAcetam (KEPPRA) IVPB 500 mg/100 mL premix, 500 mg, Intravenous, Q12H, Warden Fillers, MD, Last Rate: 400 mL/hr at 07/27/20 0605, 500 mg at 07/27/20 6160   loperamide (IMODIUM) capsule 2 mg, 2 mg, Oral, PRN, Warden Fillers, MD, 2 mg at 07/26/20 0920   ondansetron (ZOFRAN) 8 mg in sodium chloride 0.9 % 50 mL IVPB, 8 mg, Intravenous, Q8H, Warden Fillers, MD, Last Rate: 216 mL/hr at 07/27/20 0545, 8 mg at 07/27/20 0545   promethazine (PHENERGAN) 25 mg in sodium chloride 0.9 % 50 mL IVPB, 25 mg, Intravenous, Q6H, Warden Fillers, MD, Last Rate: 200 mL/hr at 07/27/20 0918, 25 mg at  07/27/20 0918   pyridOXINE (B-6) injection 100 mg, 100 mg, Intravenous, Daily, Constant, Peggy, MD, 100 mg at 07/27/20 0904   scopolamine (TRANSDERM-SCOP) 1 MG/3DAYS 1.5 mg, 1 patch, Transdermal, Q72H, Rolm Bookbinder, CNM, 1.5 mg at 07/25/20 1506  Exam: Current vital signs: BP 118/64 (BP Location: Right Arm)   Pulse 73   Temp 98.7 F (37.1 C) (Oral)   Resp 16   Ht 5' 6.5" (1.689 m)   Wt 94.1 kg   LMP 01/29/2020   SpO2 99%   BMI 32.99 kg/m  Vital signs in last 24 hours: Temp:  [98.1 F (36.7 C)-98.7 F (37.1 C)] 98.7 F (37.1 C) (06/13 1056) Pulse Rate:  [68-92] 73 (06/13 1056) Resp:  [16-20] 16 (06/13 1056) BP: (105-118)/(56-71) 118/64 (06/13 1056) SpO2:  [96 %-100 %] 99 % (06/13  1056) Weight:  [94.1 kg] 94.1 kg (06/13 0510)  GENERAL: Laying in bed with her eyes closed, drowsy appearing, in no acute distress Psych: Intermittently tearful during examination, calm and cooperative with exam Head: Normocephalic and atraumatic EENT: Normal conjunctivae, dry mucous membranes, no OP obstruction LUNGS: Normal respiratory effort. Non-labored breathing CV: Regular rate on telemetry, extremities warm and well perfused without edema ABDOMEN: Soft, non-tender, rounded Ext: warm, without obvious deformity  NEURO:  Mental Status: Drowsy, wakes to voice, alert and oriented to person, place, age, month, time, and situation. She is able to provide clear and coherent history of present illness. Good attention is noted. Naming, repetition, comprehension, and fluency are intact. Patient endorses some "word-finding difficulties" with some seizure episodes at home but there is no evidence of this on our examination today in the context of intermittent stuttering of speech that has a non-physiological quality.  Speech is hypophonic without dysarthria.  No neglect or aphasia noted on examination.  Cranial Nerves:  II: PERRL 5 mm/brisk. Visual fields full.  III, IV, VI: EOMI. Patient does not  fully open eyes during examination with some resistance to passive eye opening.  V: Sensation is intact to light touch on the face bilaterally with decreased sensation to light touch on the left face.  VII: Subtle left mouth droop / less elevation with smiling.  VIII: Hearing is intact to voice IX, X: Palate elevation is symmetric.  XI: 5/5 head turn bilaterally XII: Tongue protrudes rightward without fasciculations. Motor: Left upper and lower extremity 4/5 strength throughout with some give-way weakness noted. Right upper and lower extremity 5/5. Effort limited examination throughout limits full strength assessment.  Tone is normal. Bulk is normal.  Sensation: Intact to light touch bilaterally but with reported decreased sensation to light touch on the left hemibody with hyperasthesia noted from the left shoulder distally to the left hand.  Coordination: FTN intact bilaterally. HKS intact bilaterally with effort limited assessment.  DTRs: 2+ and symmetric patellae, biceps, and brachioradialis Gait: deferred  Labs I have reviewed labs in epic and the results pertinent to this consultation are:  CBC    Component Value Date/Time   WBC 7.6 07/25/2020 1429   RBC 3.70 (L) 07/25/2020 1429   HGB 11.2 (L) 07/25/2020 1429   HGB 11.7 04/14/2020 1100   HCT 33.5 (L) 07/25/2020 1429   HCT 33.5 (L) 04/14/2020 1100   PLT 221 07/25/2020 1429   PLT 230 04/14/2020 1100   MCV 90.5 07/25/2020 1429   MCV 89 04/14/2020 1100   MCH 30.3 07/25/2020 1429   MCHC 33.4 07/25/2020 1429   RDW 12.3 07/25/2020 1429   RDW 11.8 04/14/2020 1100   LYMPHSABS 1.9 07/25/2020 1429   LYMPHSABS 2.3 04/14/2020 1100   MONOABS 0.6 07/25/2020 1429   EOSABS 0.1 07/25/2020 1429   EOSABS 0.1 04/14/2020 1100   BASOSABS 0.1 07/25/2020 1429   BASOSABS 0.1 04/14/2020 1100   CMP     Component Value Date/Time   NA 138 07/27/2020 0944   K 3.5 07/27/2020 0944   CL 112 (H) 07/27/2020 0944   CO2 22 07/27/2020 0944   GLUCOSE  82 07/27/2020 0944   BUN <5 (L) 07/27/2020 0944   CREATININE 0.75 07/27/2020 0944   CALCIUM 8.8 (L) 07/27/2020 0944   PROT 6.7 07/25/2020 1429   ALBUMIN 3.1 (L) 07/25/2020 1429   AST 17 07/25/2020 1429   ALT 27 07/25/2020 1429   ALKPHOS 70 07/25/2020 1429   BILITOT 0.4 07/25/2020 1429  GFRNONAA >60 07/27/2020 0944   GFRAA >60 02/08/2019 1418   Drugs of Abuse  No results found for: LABOPIA, COCAINSCRNUR, LABBENZ, AMPHETMU, THCU, LABBARB   Imaging I have reviewed the images obtained:  MR Venogram 07/11/2020: Negative intracranial MRV.  No evidence for dural sinus thrombosis.  MRI Brain 07/10/2020: Negative limited MRI of the brain. No acute intracranial infarct or other abnormality.  Assessment: Leah Shea is a 35 y.o. female who is [redacted] weeks pregnant with PMH significant for bronchitis, HTN, speel apnea, complex partial seizures, migraine HA who presents with nausea, vomiting, inability to tolerate PO at home and with concerns for seizure activity. Recently started on a titration/tapering schedule, respectively, of lamotrigine/Keppra outpatient but patient has not been able to consistently take her AEDs due to hyperemesis gravidarum.  - Neurologic examination is notable for mild LUE and LLE weakness, decreased sensation, and hyperesthesia as well as diplopia and drowsiness.  - Differential diagnosis for her weakness includes post ictal todd's paralysis versus functional weakness or a component of both. Seizure activity likely related to impaired medication intake 2/2 hyperemesis gravidarum  - Priority of care for patient is to maintain antiepileptic drug therapy for prevention of further seizures. Discussed with primary provider to continue IV Keppra until nausea / vomiting controlled and patient able to tolerate oral intake and medications.   Impression: Hyperemesis gravidarum Complex partial seizure history Breakthrough seizure likely 2/2 inability to tolerate PO / seizure  medications  Recommendations: - Management of hyperemesis gravidarum per primary team  - Keppra 500 mg BID IV or PO (1:1 conversion) monotherapy. She was PREVIOUSLY ON 1,000 mg TID but given that she has stopped this medication due to N/V for 3-4 days and is pregnant, as well as question of pseudoseizures, we feel that it is most prudent to select the lower dose regimen. Like Lamictal, Keppra also has a relatively low risk for anomalies of fetal development when taken during pregnancy.   - Discontinue Lamotrigine; patient has not tolerated Lamotrigine PO for weeks- increased risk of SJS with intermittent use. Lamotrigine is also unavailable in IV formulation for use when patient unable to tolerate PO.  - Inpatient seizure precautions - No benefit of vaginal administration of the above AED oral formulations; discussed correct administration with patient - The patient expressed understanding and agreement with the plan.   Lanae Boast, AGAC-NP [facility] Pager: [phone]  I have seen and examined the patient. I have formulated the assessment and recommendations. 35 year old pregnant female with worsened seizure versus pseudoseizure frequency in the context of inability to tolerate PO due to hyperemesis gravidarum. Some soft weakness noted on exam which is most likely functional or due to Todd's paresis. Recommendations as above.   Electronically signed: Dr. Caryl Pina

## 2020-07-28 DIAGNOSIS — N760 Acute vaginitis: Secondary | ICD-10-CM | POA: Diagnosis present

## 2020-07-28 DIAGNOSIS — B9689 Other specified bacterial agents as the cause of diseases classified elsewhere: Secondary | ICD-10-CM | POA: Diagnosis present

## 2020-07-28 LAB — AMNISURE RUPTURE OF MEMBRANE (ROM) NOT AT ARMC: Amnisure ROM: NEGATIVE

## 2020-07-28 MED ORDER — METRONIDAZOLE 500 MG/100ML IV SOLN
500.0000 mg | Freq: Two times a day (BID) | INTRAVENOUS | Status: AC
  Administered 2020-07-28 – 2020-08-03 (×14): 500 mg via INTRAVENOUS
  Filled 2020-07-28 (×14): qty 100

## 2020-07-28 MED ORDER — ACETAMINOPHEN 10 MG/ML IV SOLN
1000.0000 mg | Freq: Four times a day (QID) | INTRAVENOUS | Status: AC | PRN
  Administered 2020-07-28 – 2020-07-29 (×2): 1000 mg via INTRAVENOUS
  Filled 2020-07-28 (×2): qty 100

## 2020-07-28 MED ORDER — HYDROMORPHONE HCL 1 MG/ML IJ SOLN
1.0000 mg | INTRAMUSCULAR | Status: DC | PRN
  Administered 2020-07-28 – 2020-07-30 (×6): 1 mg via INTRAVENOUS
  Administered 2020-07-30: 2 mg via INTRAVENOUS
  Administered 2020-07-30: 1 mg via INTRAVENOUS
  Administered 2020-07-31 (×4): 2 mg via INTRAVENOUS
  Administered 2020-07-31: 1 mg via INTRAVENOUS
  Administered 2020-08-01 – 2020-08-02 (×6): 2 mg via INTRAVENOUS
  Administered 2020-08-02: 1 mg via INTRAVENOUS
  Administered 2020-08-02 – 2020-08-03 (×4): 2 mg via INTRAVENOUS
  Filled 2020-07-28: qty 1
  Filled 2020-07-28 (×4): qty 2
  Filled 2020-07-28: qty 1
  Filled 2020-07-28 (×5): qty 2
  Filled 2020-07-28 (×2): qty 1
  Filled 2020-07-28: qty 2
  Filled 2020-07-28 (×3): qty 1
  Filled 2020-07-28: qty 2
  Filled 2020-07-28: qty 1
  Filled 2020-07-28: qty 2
  Filled 2020-07-28: qty 1
  Filled 2020-07-28 (×4): qty 2
  Filled 2020-07-28: qty 1
  Filled 2020-07-28: qty 2

## 2020-07-28 MED ORDER — MAGNESIUM SULFATE 2 GM/50ML IV SOLN
2.0000 g | Freq: Once | INTRAVENOUS | Status: AC
  Administered 2020-07-28: 2 g via INTRAVENOUS
  Filled 2020-07-28: qty 50

## 2020-07-28 NOTE — Evaluation (Signed)
Physical Therapy Evaluation Patient Details Name: Leah Shea MRN: [id number] DOB: March 30, 1985 Today's Date: 07/28/2020   History of Present Illness  Pt is a 35 y.o. female G7P2224 at [redacted]w[redacted]d who presents with nausea and vomiting. She reports for the last 4 days she has been unable to keep anything down.  Pt with h/o seizures and reports she had a sz on 6/13 while in hospital.  Now presenting with left sided weakness/numbness.  PHM significant for bronchitis, hypertension, sleep apnea, complex partial seizures, and migraine headaches.  Clinical Impression  Patient presents with decreased strength, decreased sensation and decreased balance impacting independence in gait and mobility.  Patient was independent prior to admission and will benefit from PT and OT to progress mobility for return to home.  Depending on progress, patient may need f/u PT at discharge.      Follow Up Recommendations Home health PT;Outpatient PT    Equipment Recommendations  Other (comment) (to be determined)    Recommendations for Other Services OT consult     Precautions / Restrictions Precautions Precautions: Fall      Mobility  Bed Mobility Overal bed mobility: Needs Assistance Bed Mobility: Sidelying to Sit;Sit to Sidelying   Sidelying to sit: Modified independent (Device/Increase time)     Sit to sidelying: Min guard General bed mobility comments: used bed rail to come to sitting, required assistance with LE for return to sidelying    Transfers Overall transfer level: Needs assistance Equipment used: 1 person hand held assist Transfers: Sit to/from Stand Sit to Stand: Min guard         General transfer comment: min guard for balance  Ambulation/Gait Ambulation/Gait assistance: Min assist Gait Distance (Feet): 3 Feet Assistive device: 1 person hand held assist Gait Pattern/deviations: Step-to pattern;Decreased stride length;Decreased dorsiflexion - left;Decreased weight shift to  left Gait velocity: decreased      Stairs            Wheelchair Mobility    Modified Rankin (Stroke Patients Only)       Balance Overall balance assessment: Needs assistance Sitting-balance support: No upper extremity supported;Feet supported Sitting balance-[name] Scale: Good     Standing balance support: No upper extremity supported Standing balance-[name] Scale: Fair                               Pertinent Vitals/Pain Pain Assessment: No/denies pain    Home Living Family/patient expects to be discharged to:: Private residence Living Arrangements: Children Available Help at Discharge: Family Type of Home: House Home Access: Stairs to enter   Secretary/administrator of Steps: 1 Home Layout: One level Home Equipment: None      Prior Function Level of Independence: Independent               Hand Dominance   Dominant Hand: Right    Extremity/Trunk Assessment   Upper Extremity Assessment Upper Extremity Assessment: LUE deficits/detail LUE Deficits / Details: grossly 3+ strength throughout, once resistance given had give-way weakness LUE Sensation: decreased light touch    Lower Extremity Assessment Lower Extremity Assessment: LLE deficits/detail LLE Deficits / Details: grossly 3+ strength throughout, once resistance given had give-way weakness LLE Sensation: decreased light touch       Communication   Communication: No difficulties  Cognition Arousal/Alertness: Lethargic (unsure if medication or post-ictal) Behavior During Therapy: Flat affect Overall Cognitive Status: Within Functional Limits for tasks assessed  General Comments      Exercises General Exercises - Lower Extremity Ankle Circles/Pumps: AROM;Both;10 reps   Assessment/Plan    PT Assessment Patient needs continued PT services  PT Problem List Decreased strength;Decreased activity tolerance;Decreased  balance;Decreased mobility;Impaired sensation       PT Treatment Interventions DME instruction;Gait training;Functional mobility training;Neuromuscular re-education;Balance training;Therapeutic exercise;Therapeutic activities;Patient/family education    PT Goals (Current goals can be found in the Care Plan section)  Acute Rehab PT Goals Patient Stated Goal: get my strength back PT Goal Formulation: With patient Time For Goal Achievement: 08/11/20 Potential to Achieve Goals: Good    Frequency Min 3X/week   Barriers to discharge        Co-evaluation               AM-PAC PT "6 Clicks" Mobility  Outcome Measure Help needed turning from your back to your side while in a flat bed without using bedrails?: A Little Help needed moving from lying on your back to sitting on the side of a flat bed without using bedrails?: A Little Help needed moving to and from a bed to a chair (including a wheelchair)?: A Little Help needed standing up from a chair using your arms (e.g., wheelchair or bedside chair)?: A Little Help needed to walk in hospital room?: A Little Help needed climbing 3-5 steps with a railing? : A Lot 6 Click Score: 17    End of Session   Activity Tolerance: Patient tolerated treatment well;Patient limited by lethargy;Other (comment) (limited by dizziness) Patient left: in bed;with call bell/phone within reach   PT Visit Diagnosis: Unsteadiness on feet (R26.81);Muscle weakness (generalized) (M62.81);Other symptoms and signs involving the nervous system (R29.898);Dizziness and giddiness (R42)    Time: 6144-3154 PT Time Calculation (min) (ACUTE ONLY): 17 min   Charges:   PT Evaluation $PT Eval Moderate Complexity: 1 Mod          07/28/2020 Margie, PT Acute Rehabilitation Services Pager:  [phone] Office:  [phone]   Olivia Canter 07/28/2020, 2:43 PM

## 2020-07-28 NOTE — Plan of Care (Signed)
  Problem: Education: Goal: Knowledge of General Education information will improve Description: Including pain rating scale, medication(s)/side effects and non-pharmacologic comfort measures Outcome: Progressing   Problem: Health Behavior/Discharge Planning: Goal: Ability to manage health-related needs will improve Outcome: Progressing   Problem: Clinical Measurements: Goal: Will remain free from infection Outcome: Progressing Goal: Respiratory complications will improve Outcome: Progressing Goal: Cardiovascular complication will be avoided Outcome: Progressing   Problem: Nutrition: Goal: Adequate nutrition will be maintained Outcome: Progressing   Problem: Coping: Goal: Level of anxiety will decrease Outcome: Progressing   Problem: Elimination: Goal: Will not experience complications related to bowel motility Outcome: Progressing Goal: Will not experience complications related to urinary retention Outcome: Progressing   Problem: Skin Integrity: Goal: Risk for impaired skin integrity will decrease Outcome: Progressing   Problem: Education: Goal: Knowledge of disease or condition will improve Outcome: Progressing Goal: Knowledge of the prescribed therapeutic regimen will improve Outcome: Progressing   Problem: Education: Goal: Knowledge of disease or condition will improve Outcome: Progressing Goal: Knowledge of the prescribed therapeutic regimen will improve Outcome: Progressing   Problem: Bowel/Gastric: Goal: Occurences of nausea and/or vomiting will decrease Outcome: Progressing   Problem: Fluid Volume: Goal: Maintenance of adequate hydration will improve Outcome: Progressing

## 2020-07-28 NOTE — Progress Notes (Addendum)
OB Note Called to see patient re: ?rupture of membranes at approximately 1030am. Patient went to bathroom and noticed what seem to be leakage of vaginal fluid and RN also saw some, too. No VB   Patient states she's been having cramps since Saturday, although patient has not brought that up previously and she states she had spotting on Saturday, as well.  Fetus is category I with what appears to be a few episodic decels when she was first put on the monitor. Tocometry negative  NAD Abdomen: soft, nttp EGBUS normal Sterile spec exam: vagina with no blood, +small amount of yellowish/white watery d/c Cervix: visually closed. Negative cough test, on manual exam, cx closed/long/high, nttp  A/p: likely BV F/u amnisure  Has not been evaluated by PT yet. Pt also states neurology hasn't seen her today. Will ask them if anything else needs to be done given residual left sided s/s and HA.  Cornelia Copa MD Attending [facility] for [employer] (Faculty Practice) 07/28/2020 Time: 1154am
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
## 2020-07-29 DIAGNOSIS — R531 Weakness: Secondary | ICD-10-CM

## 2020-07-29 DIAGNOSIS — R569 Unspecified convulsions: Secondary | ICD-10-CM | POA: Diagnosis not present

## 2020-07-29 LAB — CBC
HCT: 26.7 % — ABNORMAL LOW (ref 36.0–46.0)
Hemoglobin: 9.3 g/dL — ABNORMAL LOW (ref 12.0–15.0)
MCH: 30.9 pg (ref 26.0–34.0)
MCHC: 34.8 g/dL (ref 30.0–36.0)
MCV: 88.7 fL (ref 80.0–100.0)
Platelets: 161 10*3/uL (ref 150–400)
RBC: 3.01 MIL/uL — ABNORMAL LOW (ref 3.87–5.11)
RDW: 11.9 % (ref 11.5–15.5)
WBC: 5.2 10*3/uL (ref 4.0–10.5)
nRBC: 0 % (ref 0.0–0.2)

## 2020-07-29 LAB — COMPREHENSIVE METABOLIC PANEL
ALT: 18 U/L (ref 0–44)
AST: 15 U/L (ref 15–41)
Albumin: 2.2 g/dL — ABNORMAL LOW (ref 3.5–5.0)
Alkaline Phosphatase: 50 U/L (ref 38–126)
Anion gap: 9 (ref 5–15)
BUN: 5 mg/dL — ABNORMAL LOW (ref 6–20)
CO2: 22 mmol/L (ref 22–32)
Calcium: 8.5 mg/dL — ABNORMAL LOW (ref 8.9–10.3)
Chloride: 106 mmol/L (ref 98–111)
Creatinine, Ser: 0.73 mg/dL (ref 0.44–1.00)
GFR, Estimated: >60 mL/min (ref >60)
Glucose, Bld: 90 mg/dL (ref 70–99)
Potassium: 3.5 mmol/L (ref 3.5–5.1)
Sodium: 137 mmol/L (ref 135–145)
Total Bilirubin: 0.7 mg/dL (ref 0.3–1.2)
Total Protein: 5 g/dL — ABNORMAL LOW (ref 6.5–8.1)

## 2020-07-29 LAB — MAGNESIUM: Magnesium: 1.5 mg/dL — ABNORMAL LOW (ref 1.7–2.4)

## 2020-07-29 MED ORDER — MAGNESIUM SULFATE 2 GM/50ML IV SOLN
2.0000 g | Freq: Once | INTRAVENOUS | Status: AC
  Administered 2020-07-29: 2 g via INTRAVENOUS
  Filled 2020-07-29: qty 50

## 2020-07-29 MED ORDER — THIAMINE HCL 100 MG/ML IJ SOLN
Freq: Once | INTRAVENOUS | Status: AC
  Filled 2020-07-29: qty 1000

## 2020-07-29 MED ORDER — BOOST / RESOURCE BREEZE PO LIQD CUSTOM
1.0000 | Freq: Three times a day (TID) | ORAL | Status: DC
  Administered 2020-07-29: 1 via ORAL
  Filled 2020-07-29 (×7): qty 1

## 2020-07-29 NOTE — Progress Notes (Signed)
Neurology Progress Note  S: No overnight events. No seizure like activity since 07/26/20. States she has a migraine headache (MHA) that she was just given medicine. She denies numbness or tingling before, during, or after a MHA. Her seizures have the aura of smell of feces and black spots in her vision. Her husband states she "stares off" during the seizure and awake but not responding. She states that she is lethargic after a seizure with gaps in her memory. Her seizures are associated with left hemibody paraesthesia. She is c/o left hemibody paraesthesias now, but reports no seizure like activity since 07/26/20. She has a HA of 6/10, down from 8/10. Some blurry vision in left eye, but no loss of vision during events. No double vision today. She thinks her seizures occurred because she has not been able to take po meds due to days of n/v, which remains.   She states she has 3 boys and a girl and now she is having another boy. She states "no one pays attention to me" at home (her sons are all "daddy's boys").   Chart: Keppra and Lamotrigine since 2019 with Lamotrigine possibly for mood stabilization. Outpatient neurologist at the [facility]. No EEGs on our chart. States her neurologist stopped her Keppra when she was known to be pregnant and just continued Lamotrigine monotherapy.   Neurology was consulted on admission and had signed off after imaging came back negative. OB asked [place] to come by today and see if the patient requires any further neurological workup.   O: Current vital signs: BP 119/68 (BP Location: Right Arm)   Pulse 78   Temp 98.3 F (36.8 C) (Oral)   Resp 18   Ht 5' 6.5" (1.689 m)   Wt 94.1 kg   LMP 01/29/2020   SpO2 96%   BMI 32.99 kg/m  Vital signs in last 24 hours: Temp:  [98 F (36.7 C)-98.4 F (36.9 C)] 98.3 F (36.8 C) (06/15 1539) Pulse Rate:  [70-78] 78 (06/15 1539) Resp:  [17-18] 18 (06/15 1539) BP: (106-138)/(58-83) 119/68 (06/15 1539) SpO2:  [91 %-100 %] 96 % (06/15  1609)  GENERAL: Awake, alert in NAD. HEENT: Normocephalic and atraumatic. LUNGS: Normal respiratory effort.  Ext: warm  NEURO:  Mental Status: AA&Ox3.  Speech/Language: speech is without aphasia or dysarthria.  Naming, repetition, fluency, and comprehension intact.  Cranial Nerves:  II: PERRL 49mm. Visual fields full.  III, IV, VI: EOMI. Eyelids elevate symmetrically.  V: Sensation decreased to light touch to left V1, V2, and V3 distribution. Decreased cold sensation to left face. Vibration splits at the midline of forehead.   VII: Smile is symmetrical.  VIII: hearing intact to voice. IX, X: Palate elevates symmetrically. Hypo-phonation noted.   AO:ZHYQMVHQ shrug 5/5. Head turn is 5/5. XII: tongue is midline without fasciculations. Motor: 4+/5 strength to left triceps/biceps/grip with give away weakness and decreased effort on the left. LLE 4+/5. RUE: 5/5. RLE: 5/5.  Tone: is normal and bulk is normal. Sensation- Intact to light touch bilaterally, but more dull on the left. With extinction exam, she can feel both sides individually, but with DSS, she extinguishes to the left.     Coordination: FTN intact bilaterally.  Gait- Deferred  Medications  Current Facility-Administered Medications:    acetaminophen (OFIRMEV) IV 1,000 mg, 1,000 mg, Intravenous, Q6H PRN, Adam Phenix, MD, Last Rate: 400 mL/hr at 07/29/20 0610, 1,000 mg at 07/29/20 0610   enoxaparin (LOVENOX) injection 50 mg, 50 mg, Subcutaneous, Q24H, [place] Bing, MD,  50 mg at 07/29/20 0909   famotidine (PEPCID) tablet 20 mg, 20 mg, Oral, Q12H, 20 mg at 07/27/20 0919 **OR** famotidine (PEPCID) IVPB 20 mg premix, 20 mg, Intravenous, Q12H, Warden Fillers, MD, Last Rate: 100 mL/hr at 07/29/20 1015, 20 mg at 07/29/20 1015   feeding supplement (BOOST / RESOURCE BREEZE) liquid 1 Container, 1 Container, Oral, TID BM, [place] Bing, MD   HYDROmorphone (DILAUDID) injection 1-2 mg, 1-2 mg, Intravenous, Q3H PRN, Adam Phenix,  MD, 1 mg at 07/29/20 1015   lactated ringers infusion, , Intravenous, Continuous, Warden Fillers, MD, Last Rate: 100 mL/hr at 07/29/20 0918, New Bag at 07/29/20 0918   levETIRAcetam (KEPPRA) IVPB 500 mg/100 mL premix, 500 mg, Intravenous, Q12H, Warden Fillers, MD, Last Rate: 400 mL/hr at 07/29/20 0543, 500 mg at 07/29/20 0543   loperamide (IMODIUM) capsule 2 mg, 2 mg, Oral, PRN, Warden Fillers, MD, 2 mg at 07/26/20 0920   metroNIDAZOLE (FLAGYL) IVPB 500 mg, 500 mg, Intravenous, Q12H, [place], Billey Gosling, MD, Last Rate: 100 mL/hr at 07/29/20 0532, 500 mg at 07/29/20 0532   ondansetron (ZOFRAN) 8 mg in sodium chloride 0.9 % 50 mL IVPB, 8 mg, Intravenous, Q8H, Warden Fillers, MD, Last Rate: 216 mL/hr at 07/29/20 1420, 8 mg at 07/29/20 1420   promethazine (PHENERGAN) 25 mg in sodium chloride 0.9 % 50 mL IVPB, 25 mg, Intravenous, Q6H, Warden Fillers, MD, Last Rate: 200 mL/hr at 07/29/20 1510, 25 mg at 07/29/20 1510   pyridOXINE (B-6) injection 100 mg, 100 mg, Intravenous, Daily, Constant, Peggy, MD, 100 mg at 07/29/20 1015   scopolamine (TRANSDERM-SCOP) 1 MG/3DAYS 1.5 mg, 1 patch, Transdermal, Q72H, Neill, Caroline M, CNM, 1.5 mg at 07/28/20 1348   sodium chloride 0.9 % 1,000 mL with thiamine 100 mg, folic acid 1 mg, multivitamins adult 10 mL infusion, , Intravenous, Once, [place] Bing, MD  Imaging MD has reviewed images in epic and the results pertinent to this consultation are:  07/10/20 MRI Brain  Negative limited MRI of the brain. No acute intracranial infarct or other abnormality.  Assessment: 35 yo [redacted] week gestation patient who came in with N/V and c/o breakthrough seizure on 07/26/20. On 6/13 consult, it was noted on her DDx that her weakness could be due to post ictal Todd's paralysis vs. functional and stroke was ruled out by imaging. Based on today's exam, NP believes this to be functional as she has full use of her extremities and her left sided weakness seems to be mostly poor  effort. Her numbness and tingling could be secondary to a complicated MHA, but given the waxing and waning symptoms, likely not. Her seizure on 07/27/20 was likely breakthrough secondary to non adherence with AEDs due to n/v.   Recommendations: -No further neurological workup required. We will be available for questions.  -Continue Keppra 500mg  IV q12 hours. Keppra has been shown to have a relatively low risk for anomalies of fetal development when taken during pregnancy.  -Discharge patient (when able to take po) on Keppra 500 mg po q12 hours.  -Inpatient seizure precautions.  -Outpatient seizure precautions: Per [facility] statutes, patients with seizures are not allowed to drive until  they have been seizure-free for six months. Use caution when using heavy equipment or power tools. Avoid working on ladders or at heights. Take showers instead of baths. Ensure the water temperature is not too high on the home water heater. Do not go swimming alone. When caring for infants or  small children, sit down when holding, feeding, or changing them to minimize risk of injury to the child in the event you have a seizure. Also, Maintain good sleep hygiene. Avoid alcohol. -Patient should follow up with her regular out patient neurologist within 4 weeks of discharge.   Pt seen by Jimmye Norman, MSN, APN-BC/Nurse Practitioner/Neuro and later by MD. Note and plan to be edited as needed by MD.  Pager: [phone]  Electronically signed: Dr. Caryl Pina

## 2020-07-29 NOTE — Progress Notes (Signed)
OB Note Dr. Kristeen Mans and I requested he please come by and re-evaluate patient given persistent s/s. He state he didn't believe that any additional work up was needed, but I asked him to come by and see her again given the residual s/s.   Cornelia Copa MD Attending Center for [employer] (Faculty Practice) 07/29/2020 Time: 1230pm

## 2020-07-29 NOTE — Progress Notes (Signed)
Physical Therapy Treatment Patient Details Name: Leah Shea MRN: [id number] DOB: 09-Jul-1985 Today's Date: 07/29/2020    History of Present Illness Pt is a 35 y.o. female G7P2224 at [redacted]w[redacted]d who presents with nausea and vomiting. She reports for the last 4 days she has been unable to keep anything down.  Pt with h/o seizures and reports she had a sz on 6/13 while in hospital.  Now presenting with left sided weakness/numbness.  PHM significant for bronchitis, hypertension, sleep apnea, complex partial seizures, and migraine headaches.    PT Comments    Pt making slow progress with mobility. Tried rolling walker and cane to incr mobility. Pt felt more stable with walker. Pt c/o dizziness - when questioned states it is all the time regardless of position or position changes or head position changes. Didn't verbalize dizziness with mobility until she had amb and was getting ready to sit down. Will continue to follow and progress mobility.    Follow Up Recommendations  Home health PT     Equipment Recommendations  Rolling walker with 5" wheels    Recommendations for Other Services OT consult     Precautions / Restrictions Precautions Precautions: Fall    Mobility  Bed Mobility Overal bed mobility: Needs Assistance Bed Mobility: Sidelying to Sit;Sit to Sidelying   Sidelying to sit: Modified independent (Device/Increase time);HOB elevated     Sit to sidelying: Modified independent (Device/Increase time);HOB elevated General bed mobility comments: Incr time and effort. Pt using UE's to assist LLE back into bed    Transfers Overall transfer level: Needs assistance Equipment used: Rolling walker (2 wheeled);Straight cane Transfers: Sit to/from Stand Sit to Stand: Min guard         General transfer comment: min guard for balance  Ambulation/Gait Ambulation/Gait assistance: Min guard Gait Distance (Feet): 40 Feet Assistive device: Rolling walker (2 wheeled);Straight  cane Gait Pattern/deviations: Step-to pattern;Decreased step length - left Gait velocity: very slow and deliberate Gait velocity interpretation: <1.31 ft/sec, indicative of household ambulator General Gait Details: Assist for safety. Pt tremulous. Pt felt more stable with the walker.   Stairs             Wheelchair Mobility    Modified Rankin (Stroke Patients Only)       Balance Overall balance assessment: Needs assistance Sitting-balance support: No upper extremity supported;Feet supported Sitting balance-[name] Scale: Good     Standing balance support: No upper extremity supported Standing balance-[name] Scale: Fair                              Cognition Arousal/Alertness: Awake/alert Behavior During Therapy: Flat affect Overall Cognitive Status: Within Functional Limits for tasks assessed                                        Exercises      General Comments        Pertinent Vitals/Pain      Home Living                      Prior Function            PT Goals (current goals can now be found in the care plan section) Acute Rehab PT Goals Patient Stated Goal: get my strength back Progress towards PT goals: Progressing toward goals    Frequency  Min 3X/week      PT Plan Discharge plan needs to be updated;Equipment recommendations need to be updated    Co-evaluation              AM-PAC PT "6 Clicks" Mobility   Outcome Measure  Help needed turning from your back to your side while in a flat bed without using bedrails?: A Little Help needed moving from lying on your back to sitting on the side of a flat bed without using bedrails?: A Little Help needed moving to and from a bed to a chair (including a wheelchair)?: A Little Help needed standing up from a chair using your arms (e.g., wheelchair or bedside chair)?: A Little Help needed to walk in hospital room?: A Little Help needed climbing 3-5 steps  with a railing? : A Little 6 Click Score: 18    End of Session   Activity Tolerance: Patient tolerated treatment well Patient left: in bed;with call bell/phone within reach   PT Visit Diagnosis: Unsteadiness on feet (R26.81);Muscle weakness (generalized) (M62.81);Other symptoms and signs involving the nervous system (R29.898);Dizziness and giddiness (R42)     Time: 2947-6546 PT Time Calculation (min) (ACUTE ONLY): 17 min  Charges:  $Gait Training: 8-22 mins                     [facility] PT Acute Rehabilitation Services Pager [phone] Office [phone]    Angelina Ok [facility] 07/29/2020, 1:16 PM
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
## 2020-07-29 NOTE — Progress Notes (Signed)
Initial Nutrition Assessment  DOCUMENTATION CODES:  Obesity unspecified  INTERVENTION:  C/L diet - Boost Breeze TID ordered Consider adding MVI to IVF X 1  Consider 25(OH)D level as pt has been on long term seizure meds which put her at risk for Vitamin D  deficiency  Pt w/ 8 consecutive days of no or minimal PO intake, significant risk for malnutrition given concerns for n/v majority of pregnancy.  Reviewed placement of tube for tube feedings with pt as an option for provision of adequate nutrition. Pt is considering this option   Tube feeding rec's : Osmolite 1.2 at a goal rate of 75 ml/hr CNG. ( 2160 Kcal, 100 g protein, 1476 ml free water ) Initiate at 20 ml/hr, and advance by 10 ml q 8 hours to goal rate  Next Cortrak placement day is Friday 6/17  NUTRITION DIAGNOSIS:   Inadequate oral intake related to nausea, vomiting as evidenced by energy intake < or equal to 50% for > or equal to 5 days.  GOAL:  Patient will meet greater than or equal to 90% of their needs  MONITOR:  PO intake, Weight trends  REASON FOR ASSESSMENT:   Consult Hyperemesis  ASSESSMENT:   Now 26 weeks, adm w/ hyperemesis, seizure medication management. Wt at initial prenatal visit 90.1 kg, BMI 31.5. 9 lb weight gain to date. Pt reports n/v majority of pregnancy. Has has no or minimal po intake for the past 8 days. Has tolerated Ensure in past weeks.  Currently unable to keep liquids down.  Feels like food gets stuck in her throat. Bedside swallow eval pending per RN.  Pt on B6, unable to keep PNV down   Diet Order:   Diet Order             Diet clear liquid Room service appropriate? Yes; Fluid consistency: Thin  Diet effective now                  EDUCATION NEEDS:   Education needs have been addressed  Skin:  Skin Assessment: Reviewed RN Assessment  Height:   Ht Readings from Last 1 Encounters:  07/25/20 5' 6.5" (1.689 m)    Weight:   Wt Readings from Last 1 Encounters:   07/27/20 94.1 kg    Ideal Body Weight:   137 lbs BMI:  Body mass index is 32.99 kg/m.  Estimated Nutritional Needs:   Kcal:  2100-2300  Protein:  95-105 g  Fluid:  2.4 L

## 2020-07-30 DIAGNOSIS — O99113 Other diseases of the blood and blood-forming organs and certain disorders involving the immune mechanism complicating pregnancy, third trimester: Secondary | ICD-10-CM

## 2020-07-30 DIAGNOSIS — D696 Thrombocytopenia, unspecified: Secondary | ICD-10-CM | POA: Insufficient documentation

## 2020-07-30 DIAGNOSIS — O99119 Other diseases of the blood and blood-forming organs and certain disorders involving the immune mechanism complicating pregnancy, unspecified trimester: Secondary | ICD-10-CM | POA: Insufficient documentation

## 2020-07-30 LAB — CBC
HCT: 27.4 % — ABNORMAL LOW (ref 36.0–46.0)
Hemoglobin: 9.4 g/dL — ABNORMAL LOW (ref 12.0–15.0)
MCH: 30.5 pg (ref 26.0–34.0)
MCHC: 34.3 g/dL (ref 30.0–36.0)
MCV: 89 fL (ref 80.0–100.0)
Platelets: 147 10*3/uL — ABNORMAL LOW (ref 150–400)
RBC: 3.08 MIL/uL — ABNORMAL LOW (ref 3.87–5.11)
RDW: 11.9 % (ref 11.5–15.5)
WBC: 4.7 10*3/uL (ref 4.0–10.5)
nRBC: 0 % (ref 0.0–0.2)

## 2020-07-30 LAB — BASIC METABOLIC PANEL
Anion gap: 9 (ref 5–15)
BUN: <5 mg/dL — ABNORMAL LOW (ref 6–20)
CO2: 21 mmol/L — ABNORMAL LOW (ref 22–32)
Calcium: 8.2 mg/dL — ABNORMAL LOW (ref 8.9–10.3)
Chloride: 107 mmol/L (ref 98–111)
Creatinine, Ser: 0.7 mg/dL (ref 0.44–1.00)
GFR, Estimated: >60 mL/min (ref >60)
Glucose, Bld: 80 mg/dL (ref 70–99)
Potassium: 3.2 mmol/L — ABNORMAL LOW (ref 3.5–5.1)
Sodium: 137 mmol/L (ref 135–145)

## 2020-07-30 LAB — MAGNESIUM: Magnesium: 1.7 mg/dL (ref 1.7–2.4)

## 2020-07-30 LAB — VITAMIN D 25 HYDROXY (VIT D DEFICIENCY, FRACTURES): Vit D, 25-Hydroxy: 10.61 ng/mL — ABNORMAL LOW (ref 30–100)

## 2020-07-30 MED ORDER — POTASSIUM CHLORIDE 2 MEQ/ML IV SOLN
Freq: Once | INTRAVENOUS | Status: AC
  Filled 2020-07-30: qty 1000

## 2020-07-30 MED ORDER — OSMOLITE 1.2 CAL PO LIQD
1000.0000 mL | ORAL | Status: DC
  Filled 2020-07-30 (×2): qty 1000

## 2020-07-30 MED ORDER — DIPHENHYDRAMINE HCL 50 MG/ML IJ SOLN
25.0000 mg | Freq: Four times a day (QID) | INTRAMUSCULAR | Status: DC | PRN
  Administered 2020-07-30 – 2020-08-03 (×12): 25 mg via INTRAVENOUS
  Filled 2020-07-30 (×13): qty 1

## 2020-07-30 NOTE — Discharge Planning (Signed)
RNCM following for disposition needs. Possible home with home health services. Harmani Neto J. Lucretia Roers, RN, BSN, [place] [phone]
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
## 2020-07-30 NOTE — Progress Notes (Signed)
Nutrition Follow-up  Cortrak feeding tube to be placed Friday 6/17  Recommendations: Once placed CNG, initiate tube feedings of Osmolite 1.2 at 20 ml/hr, and advance by 10 ml q 8 hours to goal rate goal rate of 75 ml/hr CNG. ( 2160 Kcal, 100 g protein, 1476 ml free water )  Continue to obtain CMP/phos daily until goal rate reached  If patient tolerates tube placement and reaches goal TF's, IVF could be reduced  Elisabeth Cara M.Odis Luster LDN Neonatal Nutrition Support Specialist/RD III [phone]
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
## 2020-07-31 ENCOUNTER — Inpatient Hospital Stay (HOSPITAL_COMMUNITY): Payer: No Typology Code available for payment source

## 2020-07-31 DIAGNOSIS — R7989 Other specified abnormal findings of blood chemistry: Secondary | ICD-10-CM | POA: Diagnosis present

## 2020-07-31 LAB — IRON AND TIBC
Iron: 91 ug/dL (ref 28–170)
Saturation Ratios: 24 % (ref 10.4–31.8)
TIBC: 374 ug/dL (ref 250–450)
UIBC: 283 ug/dL

## 2020-07-31 LAB — RETICULOCYTES
Immature Retic Fract: 18.1 % — ABNORMAL HIGH (ref 2.3–15.9)
RBC.: 3.07 MIL/uL — ABNORMAL LOW (ref 3.87–5.11)
Retic Count, Absolute: 70.6 10*3/uL (ref 19.0–186.0)
Retic Ct Pct: 2.3 % (ref 0.4–3.1)

## 2020-07-31 LAB — FERRITIN: Ferritin: 56 ng/mL (ref 11–307)

## 2020-07-31 LAB — VITAMIN B12: Vitamin B-12: 327 pg/mL (ref 180–914)

## 2020-07-31 LAB — FOLATE: Folate: 15.4 ng/mL (ref >5.9)

## 2020-07-31 IMAGING — DX DG ABD PORTABLE 1V
1 series · 1 of 1 positions shown · non-contrast
Comparison: Portable exam [JB] hours compared to CT abdomen and
pelvis of [DATE]

CLINICAL DATA: Feeding tube insertion

EXAM:
PORTABLE ABDOMEN - 1 VIEW

[abdomen kub]
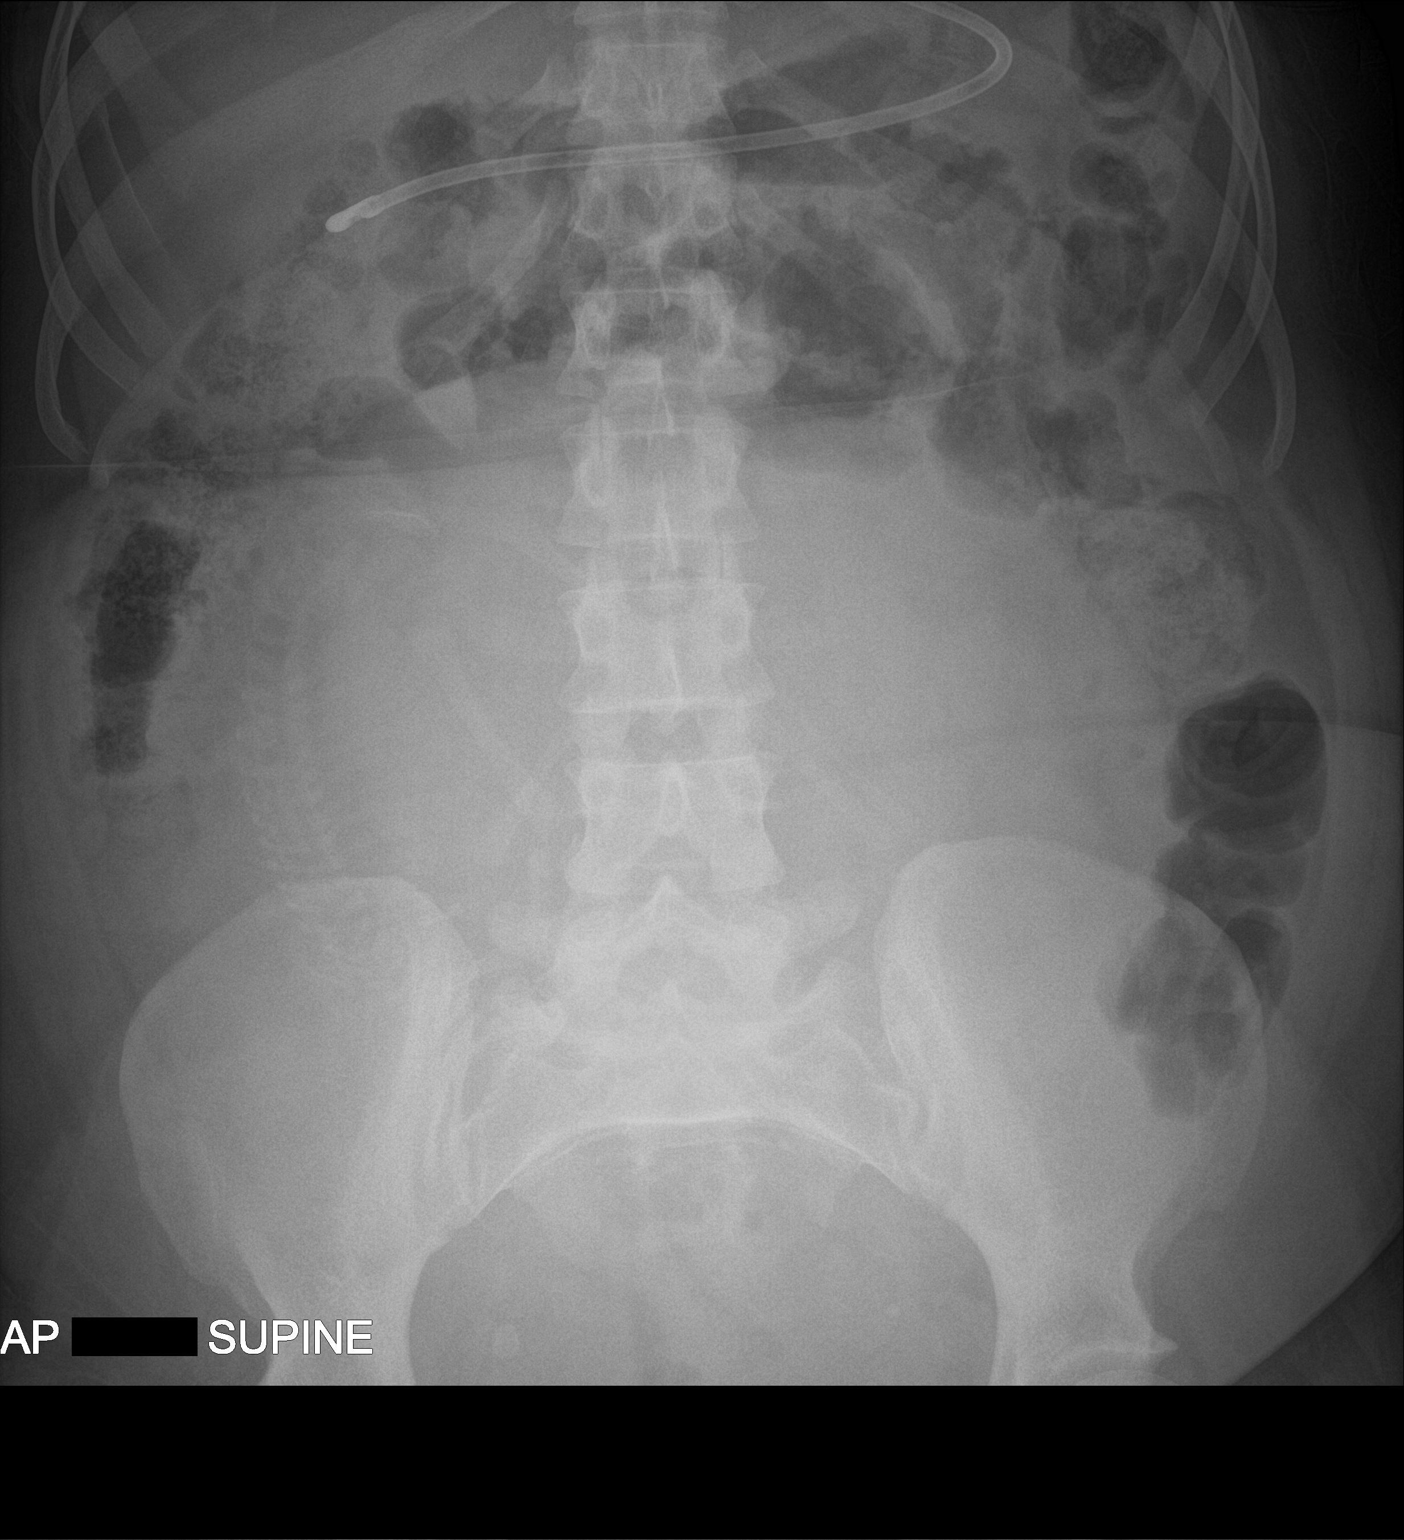

[1 of 1 positions shown; findings below may reference images not displayed]

FINDINGS: Tip of feeding tube projects over duodenal bulb.

Scattered gas and stool in colon.

Fetus within distended uterus.

Osseous structures unremarkable.
IMPRESSION: Tip of feeding tube projects over duodenal bulb.

## 2020-07-31 MED ORDER — HYDROXYZINE HCL 50 MG/ML IM SOLN
25.0000 mg | Freq: Once | INTRAMUSCULAR | Status: DC
  Filled 2020-07-31 (×2): qty 0.5

## 2020-07-31 MED ORDER — LORAZEPAM 2 MG/ML IJ SOLN
1.0000 mg | Freq: Once | INTRAMUSCULAR | Status: AC
  Administered 2020-07-31: 1 mg via INTRAVENOUS
  Filled 2020-07-31: qty 1

## 2020-07-31 MED ORDER — OSMOLITE 1.2 CAL PO LIQD
1000.0000 mL | ORAL | Status: DC
  Administered 2020-07-31 – 2020-08-05 (×6): 1000 mL
  Filled 2020-07-31 (×10): qty 1000

## 2020-07-31 MED ORDER — LORAZEPAM 2 MG/ML IJ SOLN
2.0000 mg | Freq: Three times a day (TID) | INTRAMUSCULAR | Status: DC | PRN
  Administered 2020-07-31 – 2020-08-05 (×10): 2 mg via INTRAVENOUS
  Filled 2020-07-31 (×10): qty 1
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
## 2020-07-31 NOTE — Procedures (Signed)
Cortrak  Person Inserting Tube:  Osa Craver, RD Tube Type:  Cortrak - 43 inches Tube Size:  10 Tube Location:  Right nare Initial Placement:  Stomach Secured by: Bridle Technique Used to Measure Tube Placement:  Marking at nare/corner of mouth Cortrak Secured At:  69 cm Procedure Comments:  Pt is pregnant; MD okay with obtaining xray to confirm placement  Cortrak Tube Team Note:  Consult received to place a Cortrak feeding tube.   X-ray is required, abdominal x-ray has been ordered by the Cortrak team. Please confirm tube placement before using the Cortrak tube.   If the tube becomes dislodged please keep the tube and contact the Cortrak team at [email or web address] (password [email or web address]) for replacement.  If after hours and replacement cannot be delayed, place a NG tube and confirm placement with an abdominal x-ray.    Romelle Starcher MS, RDN, LDN, CNSC Registered Dietitian III Clinical Nutrition RD Pager and On-Call Pager Number Located in [place]
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
## 2020-07-31 NOTE — Plan of Care (Signed)
7a-7p: RN educated pt on turning every couple of hours, low stimuli, pain management, breathing techniques and relaxation. Education regarding medications, tube feeding, and plan of care also provided throughout the day. Pt verbalized understanding.   Problem: Education: Goal: Knowledge of General Education information will improve Description: Including pain rating scale, medication(s)/side effects and non-pharmacologic comfort measures Outcome: Progressing   Problem: Nutrition: Goal: Adequate nutrition will be maintained Outcome: Progressing   Problem: Education: Goal: Knowledge of disease or condition will improve Outcome: Progressing   Problem: Education: Goal: Knowledge of disease or condition will improve Outcome: Progressing Goal: Knowledge of the prescribed therapeutic regimen will improve Outcome: Progressing   Problem: Bowel/Gastric: Goal: Occurences of nausea and/or vomiting will decrease Outcome: Progressing   Problem: Fluid Volume: Goal: Maintenance of adequate hydration will improve Outcome: Progressing   Problem: Nutritional: Goal: Achievement of adequate weight for body size and type will improve Outcome: Progressing

## 2020-08-01 ENCOUNTER — Inpatient Hospital Stay (HOSPITAL_COMMUNITY): Payer: No Typology Code available for payment source

## 2020-08-01 DIAGNOSIS — G40319 Generalized idiopathic epilepsy and epileptic syndromes, intractable, without status epilepticus: Secondary | ICD-10-CM | POA: Diagnosis not present

## 2020-08-01 DIAGNOSIS — Z3A26 26 weeks gestation of pregnancy: Secondary | ICD-10-CM

## 2020-08-01 LAB — BASIC METABOLIC PANEL
Anion gap: 7 (ref 5–15)
BUN: <5 mg/dL — ABNORMAL LOW (ref 6–20)
CO2: 22 mmol/L (ref 22–32)
Calcium: 8.7 mg/dL — ABNORMAL LOW (ref 8.9–10.3)
Chloride: 107 mmol/L (ref 98–111)
Creatinine, Ser: 0.66 mg/dL (ref 0.44–1.00)
GFR, Estimated: >60 mL/min (ref >60)
Glucose, Bld: 108 mg/dL — ABNORMAL HIGH (ref 70–99)
Potassium: 3.2 mmol/L — ABNORMAL LOW (ref 3.5–5.1)
Sodium: 136 mmol/L (ref 135–145)

## 2020-08-01 LAB — CBC
HCT: 26.9 % — ABNORMAL LOW (ref 36.0–46.0)
Hemoglobin: 9.2 g/dL — ABNORMAL LOW (ref 12.0–15.0)
MCH: 30.8 pg (ref 26.0–34.0)
MCHC: 34.2 g/dL (ref 30.0–36.0)
MCV: 90 fL (ref 80.0–100.0)
Platelets: 148 10*3/uL — ABNORMAL LOW (ref 150–400)
RBC: 2.99 MIL/uL — ABNORMAL LOW (ref 3.87–5.11)
RDW: 12 % (ref 11.5–15.5)
WBC: 6.1 10*3/uL (ref 4.0–10.5)
nRBC: 0 % (ref 0.0–0.2)

## 2020-08-01 IMAGING — CT CT HEAD W/O CM
4 series · 16 of 47 positions shown, 18 images · non-contrast
Comparison: [DATE]

CLINICAL DATA: Persistent mental status change

EXAM:
CT HEAD WITHOUT CONTRAST
TECHNIQUE: Contiguous axial images were obtained from the base of the skull
through the vertex without intravenous contrast.

[Series 3: head without · axial · non-contrast · 0.44mm/px · z∈[-566,-461]mm · 7 of 29 slices shown, 9 images]
[im 4/29  brain]
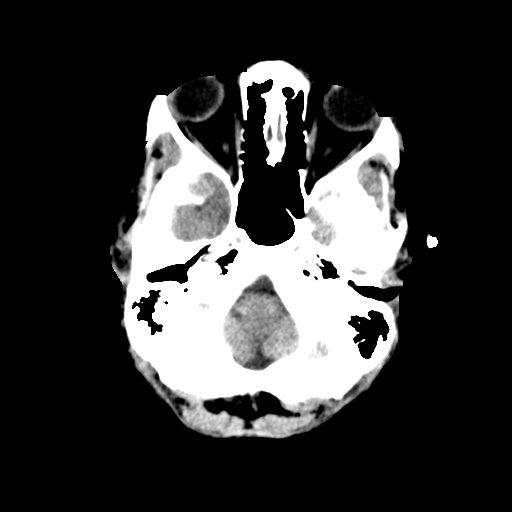
[im 4/29  bone]
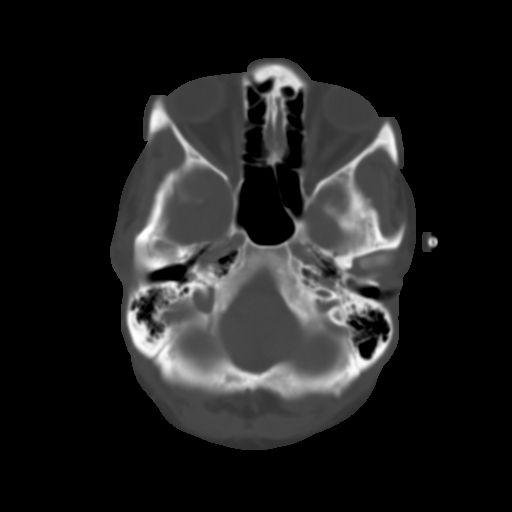
[im 8/29  brain]
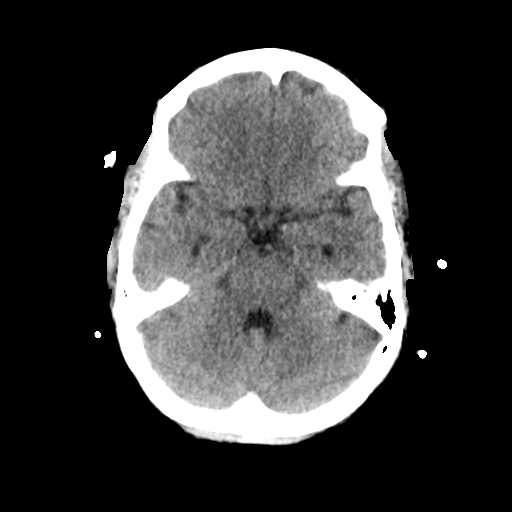
[im 11/29  brain]
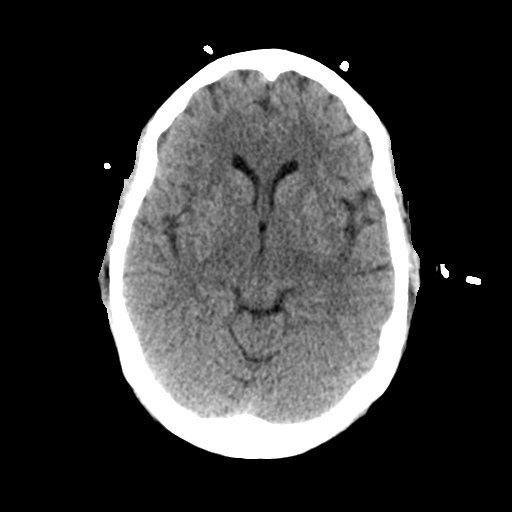
[im 15/29  brain]
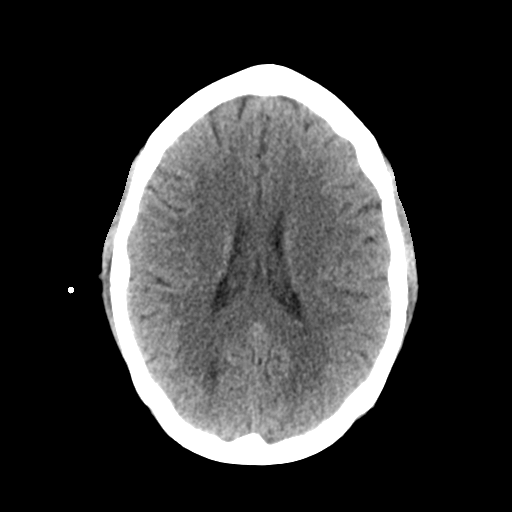
[im 18/29  brain]
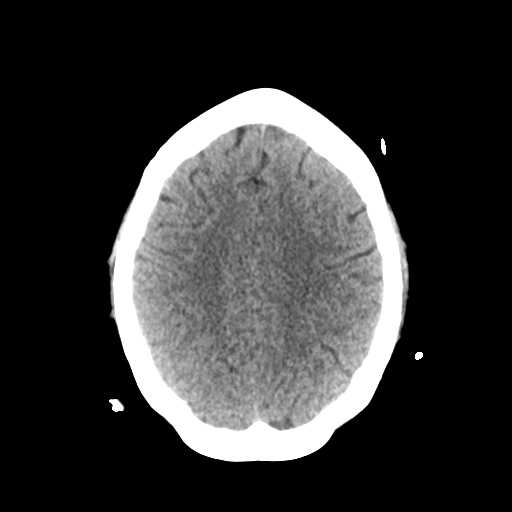
[im 18/29  bone]
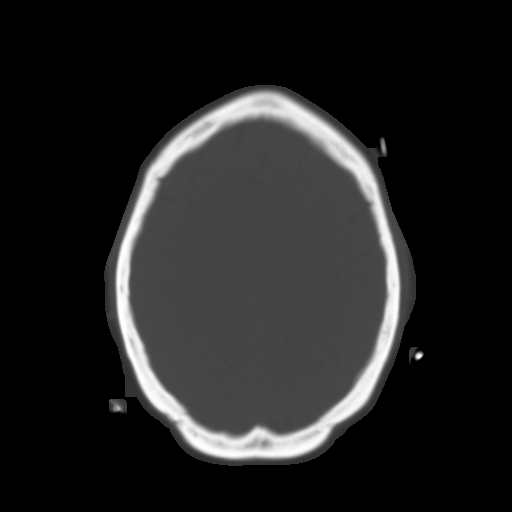
[im 22/29  brain]
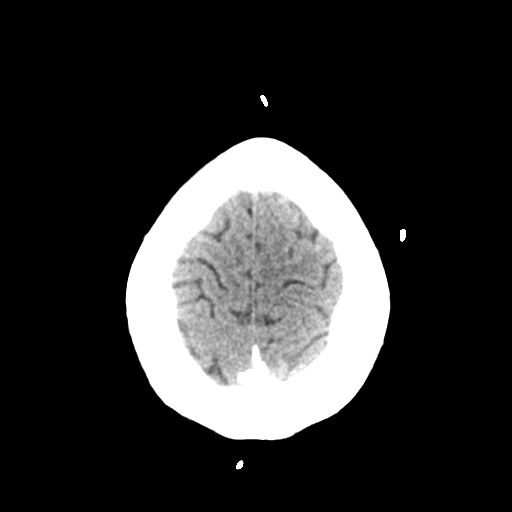
[im 25/29  brain]
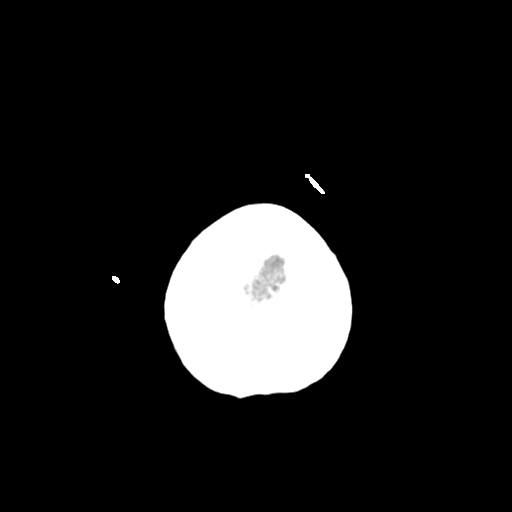

[Series 4: head bone · axial · 0.44mm/px · z∈[-567,-539]mm · 3 of 71 slices shown]
[im 8/71  bone]
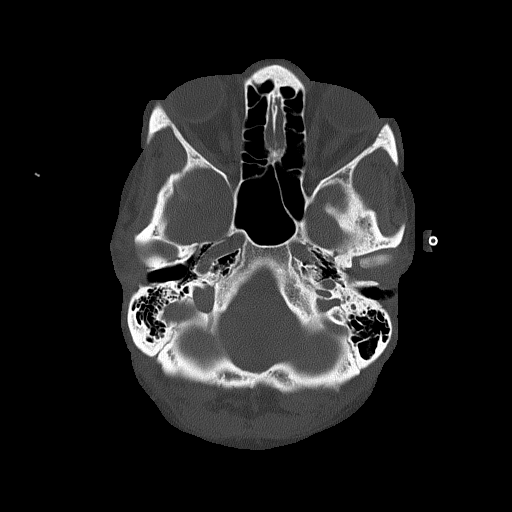
[im 15/71  bone]
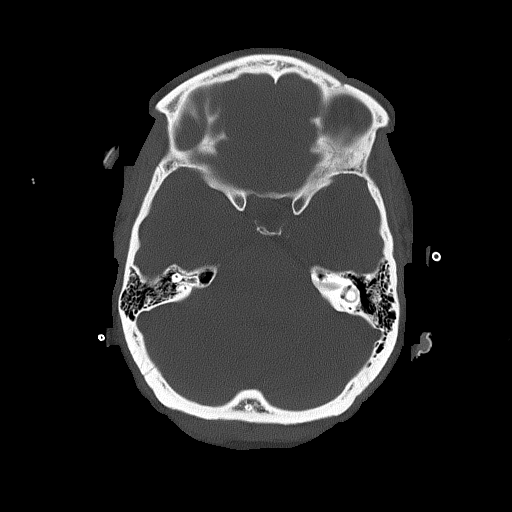
[im 22/71  bone]
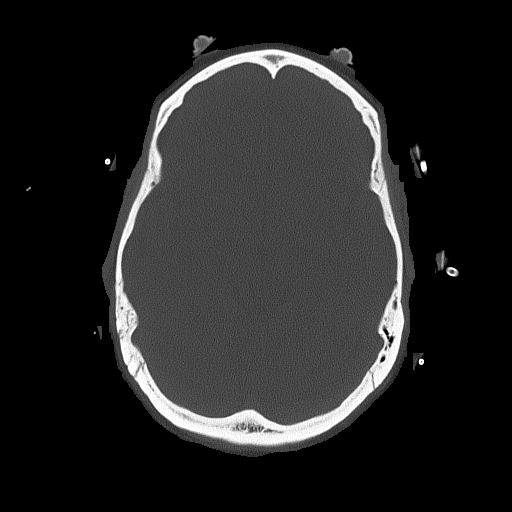

[Series 5: head without cor · coronal · non-contrast · 0.30mm/px · 3 of 65 slices shown]
[im 22/65  brain]
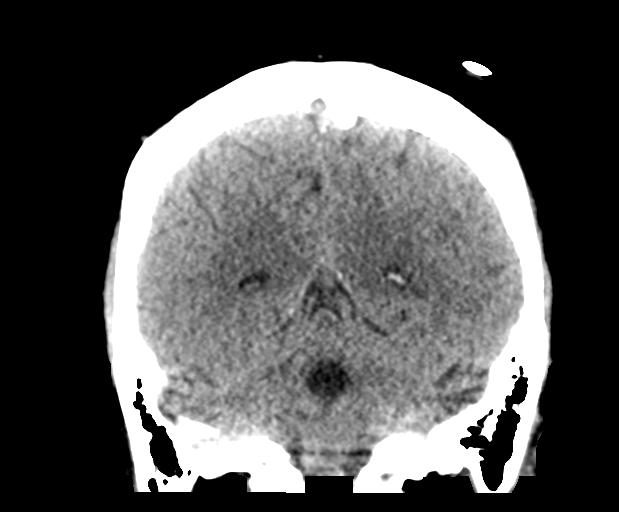
[im 29/65  brain]
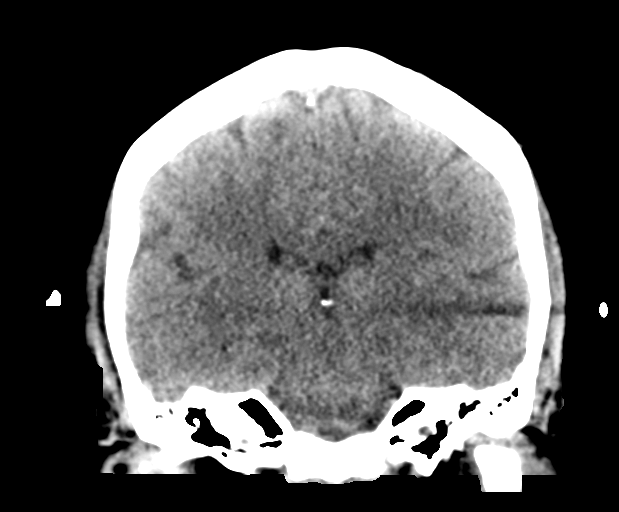
[im 36/65  brain]
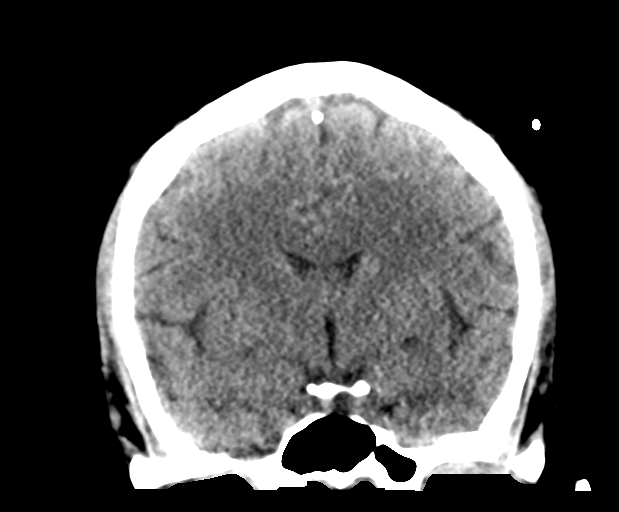

[Series 6: head without sag · sagittal · non-contrast · 0.29mm/px · 3 of 55 slices shown]
[im 19/55  brain]
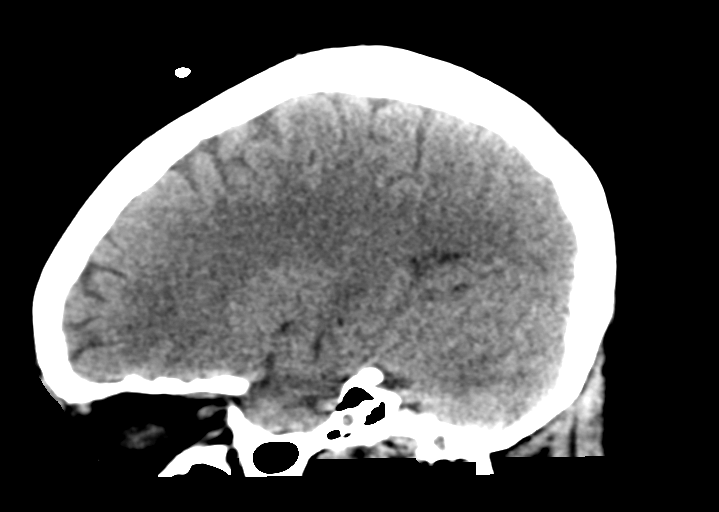
[im 28/55  brain]
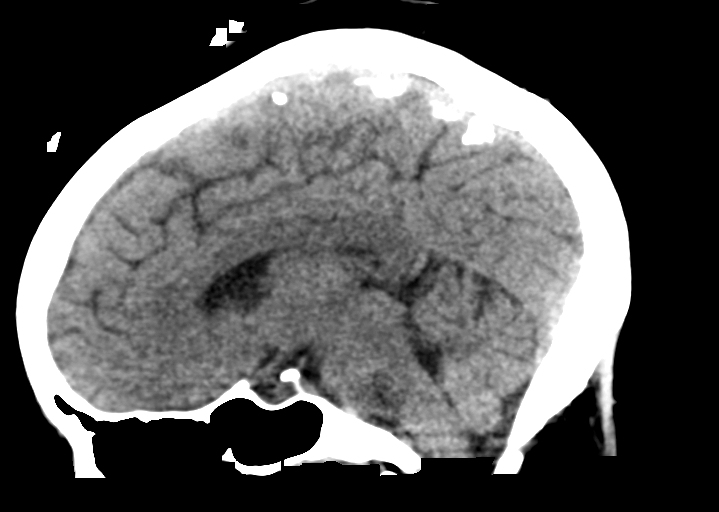
[im 37/55  brain]
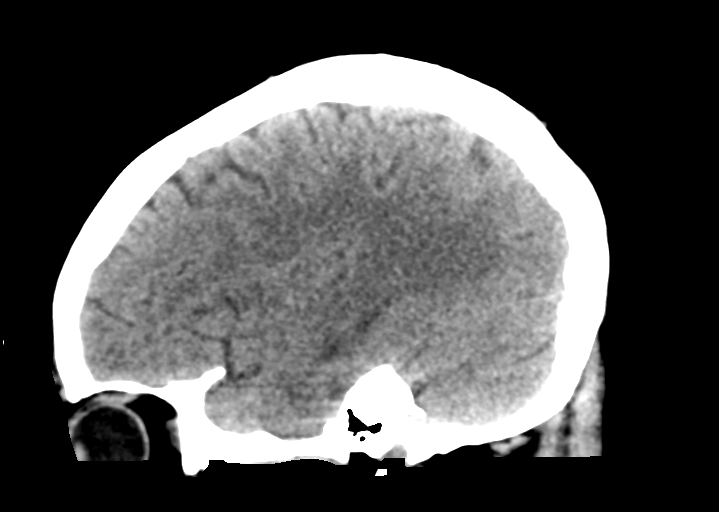

[16 of 47 positions shown; findings below may reference images not displayed]

FINDINGS: Brain: No acute infarct or hemorrhage. Lateral ventricles and
midline structures are unremarkable. No acute extra-axial fluid
collections. No mass effect.

Vascular: No hyperdense vessel or unexpected calcification.

Skull: Normal. Negative for fracture or focal lesion.

Sinuses/Orbits: No acute finding.

Other: None.
IMPRESSION: 1. No acute intracranial process.

## 2020-08-01 MED ORDER — PHENOL 1.4 % MT LIQD
2.0000 | OROMUCOSAL | Status: DC | PRN
  Administered 2020-08-01: 2 via OROMUCOSAL
  Filled 2020-08-01: qty 177

## 2020-08-01 NOTE — Progress Notes (Signed)
LTM started; no initial skin breakdown was seen; nurses and husband educated on event button; [facility] to monitor; tested event button.

## 2020-08-01 NOTE — Procedures (Signed)
Patient Name: Leah Shea  MRN: [id number]  Epilepsy Attending: Charlsie Quest  Referring Physician/Provider: Lanae Boast, NP Date: 08/01/2020 Duration: 22.12 mins  Patient history: 35 yo [redacted] week gestation patient who came in with N/V and c/o breakthrough seizure on 07/26/20. EEG to evaluate for seizure  Level of alertness: Awake  AEDs during EEG study: LEV  Technical aspects: This EEG study was done with scalp electrodes positioned according to the 10-20 International system of electrode placement. Electrical activity was acquired at a sampling rate of 500Hz  and reviewed with a high frequency filter of 70Hz  and a low frequency filter of 1Hz . EEG data were recorded continuously and digitally stored.   Description: The posterior dominant rhythm consists of 9 Hz activity of moderate voltage (25-35 uV) seen predominantly in posterior head regions, symmetric and reactive to eye opening and eye closing. Hyperventilation and photic stimulation were not performed.     IMPRESSION: This study is within normal limits. No seizures or epileptiform discharges were seen throughout the recording.   Leah Shea 

## 2020-08-01 NOTE — Progress Notes (Signed)
Upon assessment, patient was lethargic and staring distantly. LOC: Alert and oriented to person and place, not oriented to day of week. Pupils sluggish and weakness noted on left side of body. Patient's visitor expressed concern for her demeanor stating, "this is not her." I contacted neurology and discussed my findings. Plan for follow up with neurology and 24 hr. EEG.

## 2020-08-01 NOTE — Progress Notes (Signed)
Patient continues to be somnolent @ times.  When awake she stares off into space.  Oriented to person, place, but not to day of week.  Grips bilaterally weak with left somewhat less than right.  Left extremity strength weaker than right.  Pupils equal and reactive to light, but slow to react.  S.O @ bedside states "she is not herself...something is off.  When she has these at home she doesn't act this way after seizure."  Assisted patient to bedside commode.  Patient moves slowly.  Tolerated fair.

## 2020-08-01 NOTE — Progress Notes (Signed)
STAT EEG completed; results pending. Dr Yadav notified. 

## 2020-08-01 NOTE — Progress Notes (Signed)
PT Cancellation Note  Patient Details Name: Leah Shea MRN: [id number] DOB: 1985-08-12   Cancelled Treatment:    Reason Eval/Treat Not Completed: Medical issues which prohibited therapy (pt too lethargic).  Pt unable to stay awake while therapist talked to her, not events of this am.  Will defer today.   Olivia Canter 08/01/2020, 1:49 PM

## 2020-08-01 NOTE — Progress Notes (Addendum)
Neurology Progress Note  Brief HPI: 35 y.o. female at [redacted] weeks pregnant with PMHx of bronchitis, HTN, OSA, complex partial seizures, and migraine headaches who was admitted 6/11 for hyperemesis gravidarum and concern for breakthrough seizures due to inability to tolerate PO intake including seizure medications at home. She has been experiencing seizure auras with loss of time followed by lethargy and hyperesthesias of the left hemibody. Neurology initially signed off on 07/29/2020 after suggesting Keppra 500 mg IV BID in place of lamotrigine as it is also relatively safe in pregnancy and can be given IV as well as PO.  Subjective: Neurology asked to reevaluate Leah Shea for event reported by RN this morning.  RN states that at around 06:30 this morning the Leah Shea had an event with decreased oxygenation and then at around 07:00 this morning, the Leah Shea became lethargic with decreased verbal responsiveness, complete right facial paresis, numbness to her forehead, left upper and lower extremity tingling, and mild dysarthria.  The right facial paresis resolved within one hour and her other symptoms resolved within approximately 2 hours but RN notes that she remains lethargic from baseline.  Leah Shea noted a sudden sharp, pressure headache with a 10/10 severity located behind her left eye described as a stabbing and pressure pain with onset at the time of her episode this morning. She also endorses photophobia with her headache.  On examination, Leah Shea is emotional and drowsy but had just received Dilaudid IV for her headache. She does not report recalling any seizure activity or aura this morning during the events noted by the bedside nurse.   Exam: Vitals:   08/01/20 0512 08/01/20 0728  BP: (!) 136/94 131/86  Pulse: (!) 102 92  Resp: 15 16  Temp: 98.9 F (37.2 C) 98.5 F (36.9 C)  SpO2: 94%    Gen: Laying in bed, drowsy, in no acute distress Psych: Tearful during examination  EENT: NDT in  place in left nare, normal conjunctivae Resp: non-labored breathing, no respiratory distress, SpO2 in the mid 90's% on cardiac monitor Abd: soft, rounded, non-tender  Neuro: MS: Lethargic, drifts off to sleep multiple times during examination requiring stimulation for arousal and continued participation in neurologic examination. She is oriented to self, age, month, year, and situation. Speech is hypophonic and whispered. She does not vocalize beyond a whisper and her speech is barely audible. Poor attention noted.  CN: PERRL, visual fields full, EOMI, sensation to light touch decreased on the left face, smile is asymmetric with left mouth decreased elevation while contracting right side of mouth; the overall appearance of the facial asymmetry during smiling is functional, as it has been with prior exams. Face appeared symmetric while speaking/distracted. Hearing is intact to voice, shoulder shrug is weak but symmetric, tongue protrudes rightward, palate elevates symmetrically, Leah Shea hypophonic.  Motor: Assessment with a partial functional weakness noted. Bilateral upper extremities with bradykinetic movements, weak grip strength 3/5 with 3/5 strength in the biceps and triceps. Bilateral lower extremities with bradykinetic movements and 4/5 strength with antigravity movement that is able to sustain antigravity position without vertical drift with coaching. No asymmetry noted.  Sensory: Sensation to cool temperature decreased on the left upper and lower extremity DTR: 3+ and symmetric patellae, biceps, and brachioradialis.   Pertinent Labs: CBC    Component Value Date/Time   WBC 6.1 08/01/2020 0413   RBC 2.99 (L) 08/01/2020 0413   HGB 9.2 (L) 08/01/2020 0413   HGB 11.7 04/14/2020 1100   HCT 26.9 (L) 08/01/2020 0413  HCT 33.5 (L) 04/14/2020 1100   PLT 148 (L) 08/01/2020 0413   PLT 230 04/14/2020 1100   MCV 90.0 08/01/2020 0413   MCV 89 04/14/2020 1100   MCH 30.8 08/01/2020 0413   MCHC 34.2  08/01/2020 0413   RDW 12.0 08/01/2020 0413   RDW 11.8 04/14/2020 1100   LYMPHSABS 1.9 07/25/2020 1429   LYMPHSABS 2.3 04/14/2020 1100   MONOABS 0.6 07/25/2020 1429   EOSABS 0.1 07/25/2020 1429   EOSABS 0.1 04/14/2020 1100   BASOSABS 0.1 07/25/2020 1429   BASOSABS 0.1 04/14/2020 1100   CMP     Component Value Date/Time   NA 136 08/01/2020 0413   K 3.2 (L) 08/01/2020 0413   CL 107 08/01/2020 0413   CO2 22 08/01/2020 0413   GLUCOSE 108 (H) 08/01/2020 0413   BUN <5 (L) 08/01/2020 0413   CREATININE 0.66 08/01/2020 0413   CALCIUM 8.7 (L) 08/01/2020 0413   PROT 5.0 (L) 07/29/2020 0605   ALBUMIN 2.2 (L) 07/29/2020 0605   AST 15 07/29/2020 0605   ALT 18 07/29/2020 0605   ALKPHOS 50 07/29/2020 0605   BILITOT 0.7 07/29/2020 0605   GFRNONAA >60 08/01/2020 0413   GFRAA >60 02/08/2019 1418   Lab Results  Component Value Date   VITAMINB12 327 07/31/2020   07/30/2020 Vitamin D, 25- Hydroxy: 10.61   Drugs of Abuse     Component Value Date/Time   LABOPIA NONE DETECTED 07/27/2020 1759   COCAINSCRNUR NONE DETECTED 07/27/2020 1759   LABBENZ NONE DETECTED 07/27/2020 1759   AMPHETMU NONE DETECTED 07/27/2020 1759   THCU POSITIVE (A) 07/27/2020 1759   LABBARB NONE DETECTED 07/27/2020 1759    Imaging Reviewed:  07/10/20 MRI Brain  Negative limited MRI of the brain. No acute intracranial infarct or other abnormality.  Assessment: 35 yo [redacted] week gestation Leah Shea who came in with N/V and c/o breakthrough seizure on 07/26/20. On 6/13 consult, it was noted on her DDx that her weakness could be due to post ictal Todd's paralysis vs. functional and stroke was ruled out by imaging. It was felt that the Leah Shea had a largely functional examination. She has been switched from Lamictal to IV Keppra 500 mg q12h for safety profile in pregnancy and has tolerated medication well. However, she has had recurrence of her spells which semiologically do not appear consistent with epileptic seizures.  - 08/01/2020  Neurology reevaluation due to AM episode of lethargy, right facial paresis, left upper and lower extremity tingling, forehead numbness, and sudden onset headache. Leah Shea does not recall seizure activity and there was no witnessed seizure activity by staff.  - Examination reveals drowsy Leah Shea s/p hydromorphone with complaints of a left retroorbital headache that is stabbing with pressure and rated a 7/10 in severity. She does not have continued right facial paresis but her left mouth has decreased elevation during smiling, which appears to be volitional/functional (see exam). She continues to have left sensory complaints as she did on hospital arrival with bilateral upper extremity weakness, also with a functional component.  - On chart review, no seizure activity has been captured via EEG imaging in the past.  - Presentation is most likely secondary to a complicated migraine headache with recurrent neurologic symptoms possibly secondary to complicated migraines but more likely functional in nature. No documented seizure activity on EEG, hence Todd's paralysis is felt to be unlikely.   Recommendations: - Continue Keppra 500 mg IV q12h  - Inpatient seizure precautions - Headache pain management.  - Starting  LTM EEG now.   Leah Shea, AGACNP-BC [facility] [phone]  Electronically signed: Dr. Caryl Shea
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
## 2020-08-02 DIAGNOSIS — G40319 Generalized idiopathic epilepsy and epileptic syndromes, intractable, without status epilepticus: Secondary | ICD-10-CM | POA: Diagnosis not present

## 2020-08-02 LAB — VITAMIN B1: Vitamin B1 (Thiamine): 71 nmol/L (ref 66.5–200.0)

## 2020-08-02 MED ORDER — BUTALBITAL-APAP-CAFFEINE 50-325-40 MG PO TABS
1.0000 | ORAL_TABLET | Freq: Once | ORAL | Status: AC
  Administered 2020-08-02: 2 via ORAL
  Filled 2020-08-02: qty 2

## 2020-08-02 NOTE — Progress Notes (Signed)
Pt w/out output noted since 1530. Pt encouraged to void although pt denies urge to. Multiple attempts to void on own, but pt unable. Dr. Despina Hidden notified and order for I&O cath received. Total output 1650cc.

## 2020-08-02 NOTE — Procedures (Addendum)
Patient Name: Leah Shea  MRN: [id number]  Epilepsy Attending: Charlsie Quest  Referring Physician/Provider: Lanae Boast, NP Duration: 08/01/2020 1815 to 16/19/2022 1528   Patient history: 35 yo [redacted] week gestation patient who came in with N/V and c/o breakthrough seizure on 07/26/20. EEG to evaluate for seizure   Level of alertness: Awake, asleep   AEDs during EEG study: LEV   Technical aspects: This EEG study was done with scalp electrodes positioned according to the 10-20 International system of electrode placement. Electrical activity was acquired at a sampling rate of 500Hz  and reviewed with a high frequency filter of 70Hz  and a low frequency filter of 1Hz . EEG data were recorded continuously and digitally stored.   Description: The posterior dominant rhythm consists of 9 Hz activity of moderate voltage (25-35 uV) seen predominantly in posterior head regions, symmetric and reactive to eye opening and eye closing. Sleep was characterized by sleep spindles (12-14hz ) maximal frontocentral region. Hyperventilation and photic stimulation were not performed.     Event button was pressed on 08/02/2020 at 0825 for staring. Per RN, patient was taring and not responding for about 30 seconds. Concomitant EEG before, during and after the event showed normal posterior dominant rhythm.   IMPRESSION: This study is within normal limits. No seizures or epileptiform discharges were seen throughout the recording.  One episode of staring was noted on 08/02/2020 at 0825 without concomitant eeg change and was NOT epileptic.      Khalee Mazo 

## 2020-08-02 NOTE — Progress Notes (Signed)
Patient states pain 10/10 right lower abdomen. No rebound tenderness. Dr. Shawnie Pons notified @ [phone]. Heat applied.

## 2020-08-02 NOTE — Progress Notes (Signed)
Neurology Progress Note   Brief HPI: 35 y.o. female at [redacted] weeks pregnant with PMHx of bronchitis, HTN, OSA, complex partial seizures, and migraine headaches who was admitted 6/11 for hyperemesis gravidarum and concern for breakthrough seizures due to inability to tolerate PO intake including seizure medications at home. She has been experiencing seizure auras with loss of time followed by lethargy and hyperesthesias of the left hemibody. Neurology initially signed off on 07/29/2020 after suggesting Keppra 500 mg IV BID in place of lamotrigine as it is also relatively safe in pregnancy and can be given IV as well as PO.  Subjective: Patient placed EEG yesterday due to concerns for ongoing "spells" Routine EEG completed with no abnormalities noted and no seizures or epileptiform discharges LTM EEG placed for overnight evaluation  Bedside RN notified NP of event this morning of the patient staring off and not responding to her, event button was pushed without concomitant EEG change and was not epileptic CT Head was completed yesterday without evidence of acute intracranial abnormality Patient tearful on assessment today and makes comments about wanting to "give up", expresses increased anxiety, and feeling like "I am wasting everyone's time".  Complains of continued left retro-orbital migraine headache, throat pain, trouble swallowing, and side pain  Exam: Vitals:   08/02/20 0500 08/02/20 0556  BP:    Pulse:    Resp: 15   Temp:    SpO2:  96%   Gen: Laying in bed, more awake today compared to yesterday, in no acute distress Psych: Tearful during examination, appears anxious, makes negative comments about hospitalization and self including wanting to "give up" and "I feel like I am wasting everyone's time" EENT: NDT in place in left nare, normal conjunctivae Resp: non-labored breathing, no respiratory distress, SpO2 in the mid 90's% on cardiac monitor Abd: soft, rounded   Neuro: MS: Awake,  alert and oriented to self, age, month, year, and situation.  Speech is intact without dysarthria or aphasia though she continues to speak in low volume.  She is able to provide a clear and coherent history of present illness. No neglect noted. CN: PERRL 5 mm / brisk, visual fields full, EOMI with complaints of diplopia bilaterally, sensation to light touch decreased on the left face, smile is subtly asymmetric with left mouth decreased elevation while contracting right side of mouth; the overall appearance of the facial asymmetry during smiling is functional, as it has been with prior exams. Face appeared symmetric while speaking/crying/distracted. Hearing is intact to voice, shoulder shrug is weak but symmetric, tongue protrudes rightward, palate elevates symmetrically, phonation intact. Motor: Assessment with a partial functional weakness noted. Bilateral upper extremities with bradykinetic movements, weak grip strength 3/5 (left grip subtly weaker than right grip) with 3/5 strength in the biceps and triceps. Bilateral lower extremities with bradykinetic movements and 4/5 strength with antigravity movement that is able to sustain antigravity position without vertical drift. Left ankle plantar flexion 4-/5 mildly weaker than right plantar flexion with 4/5 strength. Sensory: Sensation to light touch decreased in the left lower extremity and left upper extremity compared to the right, left ankle and foot with intermittent tingling sensation noted.  DTR: 3+ and symmetric patellae  Pertinent Labs: CBC    Component Value Date/Time   WBC 6.1 08/01/2020 0413   RBC 2.99 (L) 08/01/2020 0413   HGB 9.2 (L) 08/01/2020 0413   HGB 11.7 04/14/2020 1100   HCT 26.9 (L) 08/01/2020 0413   HCT 33.5 (L) 04/14/2020 1100   PLT 148 (  L) 08/01/2020 0413   PLT 230 04/14/2020 1100   MCV 90.0 08/01/2020 0413   MCV 89 04/14/2020 1100   MCH 30.8 08/01/2020 0413   MCHC 34.2 08/01/2020 0413   RDW 12.0 08/01/2020 0413    RDW 11.8 04/14/2020 1100   LYMPHSABS 1.9 07/25/2020 1429   LYMPHSABS 2.3 04/14/2020 1100   MONOABS 0.6 07/25/2020 1429   EOSABS 0.1 07/25/2020 1429   EOSABS 0.1 04/14/2020 1100   BASOSABS 0.1 07/25/2020 1429   BASOSABS 0.1 04/14/2020 1100   CMP     Component Value Date/Time   NA 136 08/01/2020 0413   K 3.2 (L) 08/01/2020 0413   CL 107 08/01/2020 0413   CO2 22 08/01/2020 0413   GLUCOSE 108 (H) 08/01/2020 0413   BUN <5 (L) 08/01/2020 0413   CREATININE 0.66 08/01/2020 0413   CALCIUM 8.7 (L) 08/01/2020 0413   PROT 5.0 (L) 07/29/2020 0605   ALBUMIN 2.2 (L) 07/29/2020 0605   AST 15 07/29/2020 0605   ALT 18 07/29/2020 0605   ALKPHOS 50 07/29/2020 0605   BILITOT 0.7 07/29/2020 0605   GFRNONAA >60 08/01/2020 0413   GFRAA >60 02/08/2019 1418   Imaging Reviewed:  CT Head 6/18: - No acute intracranial process  EEG 08/01/2020: This study is within normal limits. No seizures or epileptiform discharges were seen throughout the recording.  Overnight EEG 08/01/2020 - 08/02/2020: This study is within normal limits. No seizures or epileptiform discharges were seen throughout the recording. One episode of staring was noted on 08/02/2020 at 0825 without concomitant eeg change and was NOT epileptic.   07/10/20 MRI Brain  Negative limited MRI of the brain. No acute intracranial infarct or other abnormality.  MR Venogram Head 07/11/2020: Negative intracranial MRV.  No evidence for dural sinus thrombosis.  Assessment: 35 yo [redacted] week gestation patient who came in with N/V and c/o breakthrough seizure on 07/26/20. On 6/13 consult, it was noted on her DDx that her weakness could be due to post ictal Todd's paralysis vs. functional and stroke was ruled out by imaging. It was felt that the patient had a largely functional examination. She has been switched from Lamictal to IV Keppra 500 mg q12h for safety profile in pregnancy and has tolerated medication well. However, she has had recurrence of her spells  which semiologically do not appear consistent with epileptic seizures.  - 08/01/2020 Neurology re-evaluation due to AM episode of lethargy, right facial paresis, left upper and lower extremity tingling, forehead numbness, and sudden onset headache. Patient does not recall seizure activity and there was no witnessed seizure activity by staff. EEG placed 6/18 for characterization of recurring spells during hospitalization. - Routine and overnight EEG within normal limits and without seizures or epileptiform discharges. One staring off spell noted without concomitant EEG change that was noted to be NOT epileptic in nature. - Examination reveals continued complaints of left retro-orbital migraine headache rated a 9/10 in severity with associated photophobia, phonophobia, and intermittent diplopia in bilateral eyes. Her left mouth has subtle decreased elevation during smiling, which appears to be volitional/functional (see exam) as seen in prior examinations but with improvement in asymmetry this morning. She continues to have left sensory complaints as she did on hospital arrival with bilateral upper extremity and grip weakness, also with a functional component. - On chart review, no seizure activity has been captured via EEG imaging in the past. 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 MRI brain obtained during her hospitalization in May does not show evidence of pituitary tumor and it is unclear what her previous work up consisted of without access to [facility].  - Presentation is most likely secondary to a complicated migraine headache with recurrent neurologic symptoms possibly secondary to complicated migraines but more likely functional in nature. It is less likely that her presentation is secondary to seizures without EEG evidence of seizures and without  concomitant EEG change captured during one of her spells. It is possible that she has a psychological disorder contributing to her presentation and complaints of increased anxiety since hospitalization.   Recommendations: - Discontinue LTM EEG  - Continue Keppra 500 mg IV or PO when able to tolerate PO intake q12h - Follow up with outpatient neurology in 4 - 6 weeks for further AED management  - Recommend psychology evaluation  - No further neurologic work up recommended at this time. Neurology will be available on an as-needed basis for further questions or concerns.  Lanae Boast, AGACNP-BC [facility] [phone]  Electronically signed: Dr. Caryl Pina

## 2020-08-02 NOTE — Progress Notes (Signed)
LTM EEG discontinued - no skin breakdown at unhook.   

## 2020-08-03 ENCOUNTER — Encounter: Payer: No Typology Code available for payment source | Admitting: Obstetrics & Gynecology

## 2020-08-03 ENCOUNTER — Other Ambulatory Visit: Payer: No Typology Code available for payment source

## 2020-08-03 DIAGNOSIS — G40319 Generalized idiopathic epilepsy and epileptic syndromes, intractable, without status epilepticus: Secondary | ICD-10-CM

## 2020-08-03 MED ORDER — ESCITALOPRAM OXALATE 10 MG PO TABS
10.0000 mg | ORAL_TABLET | Freq: Every day | ORAL | Status: DC
  Administered 2020-08-03 – 2020-08-06 (×4): 10 mg via ORAL
  Filled 2020-08-03 (×4): qty 1

## 2020-08-03 MED ORDER — ACETAMINOPHEN 325 MG PO TABS: 650.0000 mg | ORAL_TABLET | ORAL | Status: DC | PRN

## 2020-08-03 NOTE — Care Management Note (Addendum)
Case Management Note  Patient Details  Name: Traeh Estrella Alcaraz MRN: [id number] Date of Birth: 1985-04-18  Subjective/Objective:                    35 y.o. female at [redacted] weeks pregnant with PMHx of bronchitis, HTN, OSA, complex partial seizures, and migraine headaches who was admitted 6/11 for hyperemesis gravidarum and concern for breakthrough seizures due to inability to tolerate PO intake including seizure medications at home. She has been experiencing seizure auras with loss of time followed by lethargy and hyperesthesias of the left hemibody   Discharge planning Services  CM Consult  DME Arranged:  Walker rolling, Shower stool DME Agency:  [facility]    Additional Comments: Therapy services recommend rolling walker and shower seat.  CM called Velna Hatchet with [facility] with referral and she accepted referral and will deliver equipment to room.  CM made Pam with [facility]  made aware of patient to follow in the event patient will need home tube feeds. CM spoke to physician and at this time no other needs but CM will continue to follow to assist any needs.   Gretchen Short RNC-MNN, BSN Transitions [facility]  08/03/2020, 10:47 AM

## 2020-08-03 NOTE — Progress Notes (Signed)
Physical Therapy Treatment Patient Details Name: Leah Shea MRN: [id number] DOB: 08/10/1985 Today's Date: 08/03/2020    History of Present Illness Pt is a 35 y.o. female G7P2224 at [redacted]w[redacted]d who presents with nausea and vomiting. She reports for the last 4 days she has been unable to keep anything down.  Pt with h/o seizures and reports she had a sz on 6/13 while in hospital.  Now presenting with left sided weakness/numbness.  PHM significant for bronchitis, hypertension, sleep apnea, complex partial seizures, and migraine headaches.    PT Comments    Pt with improving mobility. Amb in hallway to nurses station. Remains with very flat affect and limited interaction. Will continue  to encourage incr mobility and activity.    Follow Up Recommendations  No PT follow up     Equipment Recommendations  Rolling walker with 5" wheels    Recommendations for Other Services       Precautions / Restrictions Precautions Precautions: Fall    Mobility  Bed Mobility Overal bed mobility: Modified Independent             General bed mobility comments: Incr time    Transfers Overall transfer level: Needs assistance Equipment used: Rolling walker (2 wheeled) Transfers: Sit to/from Stand Sit to Stand: Supervision            Ambulation/Gait Ambulation/Gait assistance: Supervision Gait Distance (Feet): 150 Feet Assistive device: Rolling walker (2 wheeled) Gait Pattern/deviations: Step-through pattern;Decreased stride length Gait velocity: decr Gait velocity interpretation: 1.31 - 2.62 ft/sec, indicative of limited community ambulator General Gait Details: Assist for lines   Stairs             Wheelchair Mobility    Modified Rankin (Stroke Patients Only)       Balance Overall balance assessment: Needs assistance Sitting-balance support: No upper extremity supported Sitting balance-[name] Scale: Good     Standing balance support: No upper extremity  supported Standing balance-[name] Scale: Fair                              Cognition Arousal/Alertness: Awake/alert Behavior During Therapy: Flat affect Overall Cognitive Status: Within Functional Limits for tasks assessed                                 General Comments: Very little verbalizations. Did what was asked but little other engagement      Exercises      General Comments        Pertinent Vitals/Pain Pain Assessment: Faces Faces Pain Scale: No hurt    Home Living                      Prior Function            PT Goals (current goals can now be found in the care plan section) Acute Rehab PT Goals Patient Stated Goal: to feel better Progress towards PT goals: Progressing toward goals    Frequency    Min 3X/week      PT Plan Discharge plan needs to be updated    Co-evaluation              AM-PAC PT "6 Clicks" Mobility   Outcome Measure  Help needed turning from your back to your side while in a flat bed without using bedrails?: None Help needed moving from lying on your  back to sitting on the side of a flat bed without using bedrails?: None Help needed moving to and from a bed to a chair (including a wheelchair)?: A Little Help needed standing up from a chair using your arms (e.g., wheelchair or bedside chair)?: None Help needed to walk in hospital room?: A Little Help needed climbing 3-5 steps with a railing? : A Little 6 Click Score: 21    End of Session   Activity Tolerance: Patient tolerated treatment well Patient left: in bed;with call bell/phone within reach Nurse Communication: Mobility status PT Visit Diagnosis: Other abnormalities of gait and mobility (R26.89)     Time: 1630-1701 PT Time Calculation (min) (ACUTE ONLY): 31 min  Charges:  $Gait Training: 23-37 mins                     [facility] PT Acute Rehabilitation Services Pager [phone] Office [phone]    Leah Shea  Leah Shea 08/03/2020, 5:16 PM

## 2020-08-03 NOTE — Progress Notes (Signed)
Nutrition  Goal TF's reached, providing 2160 Kcal, 100 g protein, 1476 ml free water  IVF could be reduced to 40 ml/hr, and this will allow to continue to meet fluid needs of 2.4 L/day  No emesis is reported  Consider allowing pt to trial regular diet. If solid food tolerated w/o emesis X 2 meals, consider reducing TF's to 40 ml/hr and monitoring po intake for 24 hours prior to d/c ing TF's Pt was essentially w/o PO intake for 8 days prior to initiation of TF's and has not yet had time to replete nutrient stores. Would like to ensure solid food tolerance prior to pulling feeding tube  Consider vitamin D supplementation, for correction of deficiency.  Deficiency can impact depression and anxiety.   (If pt ends up d/c home on TF"s will need free water flushes of 155 ml q 6 hours in addition to the TF rate of 75 ml/hr)  Elisabeth Cara M.Odis Luster LDN Neonatal Nutrition Support Specialist/RD III [phone]
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
## 2020-08-04 DIAGNOSIS — F419 Anxiety disorder, unspecified: Secondary | ICD-10-CM | POA: Diagnosis present

## 2020-08-04 DIAGNOSIS — F32A Depression, unspecified: Secondary | ICD-10-CM | POA: Diagnosis present

## 2020-08-04 DIAGNOSIS — O219 Vomiting of pregnancy, unspecified: Secondary | ICD-10-CM | POA: Diagnosis not present

## 2020-08-04 MED ORDER — VITAMIN D (ERGOCALCIFEROL) 1.25 MG (50000 UNIT) PO CAPS
50000.0000 [IU] | ORAL_CAPSULE | ORAL | Status: DC
  Administered 2020-08-04: 50000 [IU] via ORAL
  Filled 2020-08-04: qty 1

## 2020-08-04 NOTE — Progress Notes (Signed)
Occupational Therapy Treatment Patient Details Name: Leah Shea MRN: [id number] DOB: June 11, 1985 Today's Date: 08/04/2020    History of present illness 35 y.o. female G7P2224 at [redacted]w[redacted]d who presents with nausea and vomiting. She reports for the last 4 days she has been unable to keep anything down.  Pt with h/o seizures and reports she had a sz on 6/13 while in hospital.  Now presenting with left sided weakness/numbness.  PHM significant for bronchitis, hypertension, sleep apnea, complex partial seizures, and migraine headaches.   OT comments  Pt progressing towards established OT goals. Continues to present with decreased balance, activity tolerance, and volition/engagement. Pt performing toileting and hand hygiene at sink with Supervision; one LOB during toileting and requiring Min A for correction. Providing education and demonstration of safe tub transfer; pt verbalize understanding - will practice next session. Pt performing functional mobility in hallway with Supervision and RW. Continue to recommend dc to home once medically stable per physician. Will continue to follow acutely as admitted.   Follow Up Recommendations  No OT follow up    Equipment Recommendations  Tub/shower seat (shower seat in room)    Recommendations for Other Services      Precautions / Restrictions Precautions Precautions: Fall       Mobility Bed Mobility Overal bed mobility: Modified Independent             General bed mobility comments: increased time    Transfers Overall transfer level: Needs assistance Equipment used: Rolling walker (2 wheeled) Transfers: Sit to/from Stand Sit to Stand: Supervision              Balance Overall balance assessment: Needs assistance Sitting-balance support: No upper extremity supported Sitting balance-[name] Scale: Good     Standing balance support: No upper extremity supported;During functional activity;Bilateral upper extremity  supported Standing balance-[name] Scale: Fair                             ADL either performed or assessed with clinical judgement   ADL Overall ADL's : Needs assistance/impaired     Grooming: Supervision/safety;Standing;Wash/dry hands Grooming Details (indicate cue type and reason): Supervision for safety                 Toilet Transfer: Supervision/safety;RW;Minimal assistance;Ambulation;Regular Teacher, adult education Details (indicate cue type and reason): Majority of time supervision; one LOB when looking back at toilet nad needing MIn A for correction Toileting- Clothing Manipulation and Hygiene: Supervision/safety;Sit to/from Nurse, children's Details (indicate cue type and reason): Educating pt on safe tub traansfer; demonstrating to patient. Pt declined to practice at this time but agreeable to attempt next session Functional mobility during ADLs: Supervision/safety;Rolling walker General ADL Comments: Pt presenting with decreased activtiy tolerance and balance. Flat affect requiring increased encouragement     Vision   Additional Comments: Reports continued diplopia that has been occuring for over a year; no change with covering one eye   Perception     Praxis      Cognition Arousal/Alertness: Awake/alert Behavior During Therapy: Flat affect Overall Cognitive Status: Within Functional Limits for tasks assessed                                 General Comments: little verbalizations, but compliant with treatment. Occasionally smiling at end of session.        Exercises  Shoulder Instructions       General Comments VSS    Pertinent Vitals/ Pain       Pain Assessment: 0-10 Faces Pain Scale: Hurts a little bit Pain Location: generalized discomfort Pain Descriptors / Indicators: Discomfort Pain Intervention(s): Limited activity within patient's tolerance;Monitored during session  Home Living                                           Prior Functioning/Environment              Frequency  Min 2X/week        Progress Toward Goals  OT Goals(current goals can now be found in the care plan section)  Progress towards OT goals: Progressing toward goals  Acute Rehab OT Goals Patient Stated Goal: to feel better OT Goal Formulation: With patient Time For Goal Achievement: 08/13/20 Potential to Achieve Goals: Good ADL Goals Pt Will Perform Lower Body Bathing: with modified independence;sit to/from stand Pt Will Perform Lower Body Dressing: with modified independence;sit to/from stand Pt Will Transfer to Toilet: with modified independence;ambulating Pt Will Perform Toileting - Clothing Manipulation and hygiene: with modified independence;sitting/lateral leans;sit to/from stand  Plan Discharge plan remains appropriate    Co-evaluation                 AM-PAC OT "6 Clicks" Daily Activity     Outcome Measure   Help from another person eating meals?: None Help from another person taking care of personal grooming?: A Little Help from another person toileting, which includes using toliet, bedpan, or urinal?: A Little Help from another person bathing (including washing, rinsing, drying)?: A Little Help from another person to put on and taking off regular upper body clothing?: A Little Help from another person to put on and taking off regular lower body clothing?: A Little 6 Click Score: 19    End of Session Equipment Utilized During Treatment: Rolling walker  OT Visit Diagnosis: Other abnormalities of gait and mobility (R26.89);Muscle weakness (generalized) (M62.81);Pain   Activity Tolerance Patient tolerated treatment well   Patient Left in bed;with call bell/phone within reach   Nurse Communication Mobility status        Time: 9937-1696 OT Time Calculation (min): 28 min  Charges: OT General Charges $OT Visit: 1 Visit OT Treatments $Self Care/Home  Management : 8-22 mins $Therapeutic Activity: 8-22 mins  Cayman Brogden MSOT, OTR/L Acute Rehab Pager: [phone] Office: [phone]   Theodoro Grist Bannon Giammarco 08/04/2020, 3:50 PM

## 2020-08-04 NOTE — Progress Notes (Signed)
Patient ID: Leah Shea, female   DOB: 1986-01-05, 35 y.o.   Shea: [id number] FACULTY PRACTICE ANTEPARTUM(COMPREHENSIVE) NOTE  Leah Shea is a 35 y.o. G8Q7619 at [redacted]w[redacted]d who is admitted for nausea and vomiting, dehydration and report of seizures.  Also has extensive psychiatric history.  Fetal presentation is unsure.  Length of Stay:  10  Days  Date of admission:07/25/2020  Subjective: Patient still has feeding tube in place.  Tolerated clears yesterday and oral medications. Willing to try regular diet today.  Patient reports the fetal movement as active. Patient reports uterine contraction  activity as irregular. Patient reports  vaginal bleeding as none. Patient describes fluid per vagina as none.  Vitals:  Blood pressure 126/71, pulse 93, temperature 98.7 F (37.1 C), temperature source Oral, resp. rate 18, height 5' 6.5" (1.689 m), weight 96.8 kg, last menstrual period 01/29/2020, SpO2 97 %. Vitals:   08/03/20 2046 08/04/20 0254 08/04/20 0752 08/04/20 0800  BP: (!) 110/58 (!) 103/59 126/71   Pulse: 94 88 93   Resp: 18 20 20 18   Temp: 98 F (36.7 C) 98.2 F (36.8 C) 98.7 F (37.1 C)   TempSrc: Oral Oral Oral   SpO2: 98% 95% 97%   Weight:      Height:       Physical Examination: General: somnolent but appropriately responsive, no distress at time of exam Abdomen: soft, nontender, gravid Fundal Height:  size equals dates Cervical Exam: Not evaluated. . Extremities: extremities normal, atraumatic, no cyanosis or edema   Fetal Monitoring:     FHR 145s, 10 x 10 accels, no decels appropriate for gestational age  Labs:  No results found for this or any previous visit (from the past 24 hour(s)).  Imaging Studies:    CT HEAD WO CONTRAST  Result Date: 08/01/2020 CLINICAL DATA:  Persistent mental status change EXAM: CT HEAD WITHOUT CONTRAST TECHNIQUE: Contiguous axial images were obtained from the base of the skull through the vertex without intravenous contrast.  COMPARISON:  07/10/2020 FINDINGS: Brain: No acute infarct or hemorrhage. Lateral ventricles and midline structures are unremarkable. No acute extra-axial fluid collections. No mass effect. Vascular: No hyperdense vessel or unexpected calcification. Skull: Normal. Negative for fracture or focal lesion. Sinuses/Orbits: No acute finding. Other: None. IMPRESSION: 1. No acute intracranial process. Electronically Signed   By: 07/12/2020 M.D.   On: 08/01/2020 21:15   DG Abd Portable 1V  Result Date: 07/31/2020 CLINICAL DATA:  Feeding tube insertion EXAM: PORTABLE ABDOMEN - 1 VIEW COMPARISON:  Portable exam 1445 hours compared to CT abdomen and pelvis of 12/24/2018 FINDINGS: Tip of feeding tube projects over duodenal bulb. Scattered gas and stool in colon. Fetus within distended uterus. Osseous structures unremarkable. IMPRESSION: Tip of feeding tube projects over duodenal bulb. Electronically Signed   By: 13/10/2018 M.D.   On: 07/31/2020 15:23   EEG adult  Result Date: 08/01/2020 08/03/2020, MD     08/01/2020  6:24 PM Patient Name: Leah Shea: Leah Shea Epilepsy Attending: [id number] Referring Physician/Provider: Charlsie Quest, NP Date: 08/01/2020 Duration: 22.12 mins Patient history: 35 yo [redacted] week gestation patient who came in with N/V and c/o breakthrough seizure on 07/26/20. EEG to evaluate for seizure Level of alertness: Awake AEDs during EEG study: LEV Technical aspects: This EEG study was done with scalp electrodes positioned according to the 10-20 International system of electrode placement. Electrical activity was acquired at a sampling rate of 500Hz  and reviewed with a high frequency  filter of 70Hz  and a low frequency filter of 1Hz . EEG data were recorded continuously and digitally stored. Description: The posterior dominant rhythm consists of 9 Hz activity of moderate voltage (25-35 uV) seen predominantly in posterior head regions, symmetric and reactive to eye opening and eye  closing. Hyperventilation and photic stimulation were not performed.   IMPRESSION: This study is within normal limits. No seizures or epileptiform discharges were seen throughout the recording. Leah Shea   Overnight EEG with video  Result Date: 08/02/2020 Leah Harman, MD     08/02/2020  3:43 PM Patient Name: Leah Shea Shea: 08/04/2020 Epilepsy Attending: [name] Referring Physician/Provider: [id number], NP Duration: 08/01/2020 1815 to 16/19/2022 1528  Patient history: 35 yo [redacted] week gestation patient who came in with N/V and c/o breakthrough seizure on 07/26/20. EEG to evaluate for seizure  Level of alertness: Awake, asleep  AEDs during EEG study: LEV  Technical aspects: This EEG study was done with scalp electrodes positioned according to the 10-20 International system of electrode placement. Electrical activity was acquired at a sampling rate of 500Hz  and reviewed with a high frequency filter of 70Hz  and a low frequency filter of 1Hz . EEG data were recorded continuously and digitally stored.  Description: The posterior dominant rhythm consists of 9 Hz activity of moderate voltage (25-35 uV) seen predominantly in posterior head regions, symmetric and reactive to eye opening and eye closing. Sleep was characterized by sleep spindles (12-14hz ) maximal frontocentral region. Hyperventilation and photic stimulation were not performed.   Event button was pressed on 08/02/2020 at 0825 for staring. Per RN, patient was taring and not responding for about 30 seconds. Concomitant EEG before, during and after the event showed normal posterior dominant rhythm.  IMPRESSION: This study is within normal limits. No seizures or epileptiform discharges were seen throughout the recording. One episode of staring was noted on 08/02/2020 at 0825 without concomitant eeg change and was NOT epileptic.   Leah Shea     Medications:  Scheduled  enoxaparin (LOVENOX) injection  50 mg Subcutaneous Q24H    escitalopram  10 mg Oral Daily   famotidine  20 mg Oral Q12H   hydrOXYzine  25 mg Intramuscular Once   pyridOXINE  100 mg Intravenous Daily   scopolamine  1 patch Transdermal Q72H   Vitamin D (Ergocalciferol)  50,000 Units Oral Q7 days   I have reviewed the patient's current medications.  ASSESSMENT: Estimated Date of Delivery: 11/04/20   Principal Problem:   Nausea and vomiting during pregnancy Active Problems:   History of 3 cesarean sections   Seizure disorder during pregnancy in second trimester (HCC)   Chronic hypertension affecting pregnancy   Posttraumatic stress disorder   BV (bacterial vaginosis)   Low vitamin D level   Anxiety and depression  PLAN: Appreciate Neurology evaluation and input, will consult as needed. Negative evaluation thus far. Continue Keppra and other medications as recommended  Appreciate Psychiatry recommendations; given history of anxiety, depression, PTSD and there is great concern about psychosomatic symptoms. Lexapro initiated yesterday, patient needs outpatient follow up with psychotherapist at the [facility],   Appreciate Nutrition Specialist recommendations, will try regular diet today. Hope to remove feeding tube soon. Continue anti-nausea medications for now.  Appreciate Physical Therapy input given patient's mobility issues. Patient is still ambulating with assistance of walker. Of note, no ambulation issues prior to admission.   Reassuring FHR tracing for gestational age. Stable BP.  No acute OB issues.  Continue  routine antenatal care, will hope to discharge her soon. Appreciate [facility] help in arranging for anything patient needs at home.    Jaynie Collins, MD
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
## 2020-08-04 NOTE — Plan of Care (Signed)
  Problem: Education: Goal: Knowledge of General Education information will improve Description: Including pain rating scale, medication(s)/side effects and non-pharmacologic comfort measures Outcome: Progressing   Problem: Clinical Measurements: Goal: Ability to maintain clinical measurements within normal limits will improve Outcome: Progressing Goal: Will remain free from infection Outcome: Progressing   Problem: Activity: Goal: Risk for activity intolerance will decrease Outcome: Progressing   Problem: Nutrition: Goal: Adequate nutrition will be maintained Outcome: Progressing   Problem: Education: Goal: Knowledge of disease or condition will improve Outcome: Progressing   Problem: Education: Goal: Knowledge of the prescribed therapeutic regimen will improve Outcome: Progressing   Problem: Bowel/Gastric: Goal: Occurences of nausea and/or vomiting will decrease Outcome: Progressing   Problem: Fluid Volume: Goal: Maintenance of adequate hydration will improve Outcome: Progressing

## 2020-08-04 NOTE — Care Management (Addendum)
CM spoke to MD and plan is for patient to have IV fluids at home if possible through a Home Health agency.  CM spoke to Jeri Modena with referral.   CM spoke to patient and she did not have preference of agency just whomever was in network with her insurance.    [facility] #  [phone] and spoke to [facility] in the call center requesting auth for patient to have home health IV fluids at discharge.  She informed CM she will send a message to the patient's PCP team- [facility] Team of request with CM call back number # [phone].   CM faxed H/P and also latest progress note to fax # [phone].    Gretchen Short RNC-MNN, BSN Transitions of Care Pediatrics/[facility]

## 2020-08-05 ENCOUNTER — Inpatient Hospital Stay: Payer: Self-pay

## 2020-08-05 DIAGNOSIS — O99352 Diseases of the nervous system complicating pregnancy, second trimester: Secondary | ICD-10-CM | POA: Diagnosis not present

## 2020-08-05 DIAGNOSIS — Z3A27 27 weeks gestation of pregnancy: Secondary | ICD-10-CM

## 2020-08-05 DIAGNOSIS — G40909 Epilepsy, unspecified, not intractable, without status epilepticus: Secondary | ICD-10-CM | POA: Diagnosis not present

## 2020-08-05 DIAGNOSIS — O219 Vomiting of pregnancy, unspecified: Secondary | ICD-10-CM | POA: Diagnosis not present

## 2020-08-05 MED ORDER — SODIUM CHLORIDE 0.9% FLUSH
10.0000 mL | Freq: Two times a day (BID) | INTRAVENOUS | Status: DC
Start: 2020-08-05 — End: 2020-08-06
  Administered 2020-08-05 (×2): 10 mL

## 2020-08-05 MED ORDER — SODIUM CHLORIDE 0.9% FLUSH: 10.0000 mL | INTRAVENOUS | Status: DC | PRN

## 2020-08-05 MED ORDER — LEVETIRACETAM 500 MG PO TABS
500.0000 mg | ORAL_TABLET | Freq: Two times a day (BID) | ORAL | Status: DC
  Administered 2020-08-05 – 2020-08-06 (×2): 500 mg via ORAL
  Filled 2020-08-05 (×3): qty 1

## 2020-08-05 MED ORDER — CHLORHEXIDINE GLUCONATE CLOTH 2 % EX PADS
6.0000 | MEDICATED_PAD | Freq: Every day | CUTANEOUS | Status: DC
  Administered 2020-08-05 – 2020-08-06 (×2): 6 via TOPICAL

## 2020-08-05 NOTE — Progress Notes (Signed)
Patient ID: Leah Shea, female   DOB: 1985/03/01, 35 y.o.   MRN: [id number] FACULTY PRACTICE ANTEPARTUM(COMPREHENSIVE) NOTE  Leah Shea is a 35 y.o. [id number] at [redacted]w[redacted]d who is admitted for nausea and vomiting, dehydration and report of seizures.  Also has extensive psychiatric history.  Fetal presentation is unsure.  Length of Stay:  11  Days  Date of admission:07/25/2020  Subjective: Tolerated regular diet yesterday and oral medications. Wants feeding tube removed. Patient reports the fetal movement as active. Patient reports uterine contraction  activity as irregular. Patient reports  vaginal bleeding as none. Patient describes fluid per vagina as none.  Vitals:  Blood pressure 132/73, pulse 83, temperature 97.8 F (36.6 C), temperature source Oral, resp. rate 18, height 5' 6.5" (1.689 m), weight 96.8 kg, last menstrual period 01/29/2020, SpO2 98 %. Vitals:   08/04/20 1951 08/04/20 2320 08/05/20 0409 08/05/20 0800  BP: (!) 118/57 121/64 127/79 132/73  Pulse: 91 84 84 83  Resp: 19 20 18 18   Temp: 98.4 F (36.9 C) 98.6 F (37 C) 98.3 F (36.8 C) 97.8 F (36.6 C)  TempSrc: Oral Oral Oral Oral  SpO2: 97% 95% 95% 98%  Weight:      Height:       Physical Examination: General: somnolent but appropriately responsive, no distress at time of exam Abdomen: soft, nontender, gravid Fundal Height:  size equals dates Cervical Exam: Not evaluated. . Extremities: extremities normal, atraumatic, no cyanosis or edema   Fetal Monitoring:  FHR 150s, 10 x 10 accels, no decels appropriate for gestational age  Labs:  No results found for this or any previous visit (from the past 24 hour(s)).  Imaging Studies:    CT HEAD WO CONTRAST  Result Date: 08/01/2020 CLINICAL DATA:  Persistent mental status change EXAM: CT HEAD WITHOUT CONTRAST TECHNIQUE: Contiguous axial images were obtained from the base of the skull through the vertex without intravenous contrast. COMPARISON:   07/10/2020 FINDINGS: Brain: No acute infarct or hemorrhage. Lateral ventricles and midline structures are unremarkable. No acute extra-axial fluid collections. No mass effect. Vascular: No hyperdense vessel or unexpected calcification. Skull: Normal. Negative for fracture or focal lesion. Sinuses/Orbits: No acute finding. Other: None. IMPRESSION: 1. No acute intracranial process. Electronically Signed   By: 07/12/2020 M.D.   On: 08/01/2020 21:15   EEG adult  Result Date: 08/01/2020 08/03/2020, MD     08/01/2020  6:24 PM Patient Name: Kearsten Ginther MRN: [facility] Apo Epilepsy Attending: [id number] Referring Physician/Provider: Charlsie Quest, NP Date: 08/01/2020 Duration: 22.12 mins Patient history: 35 yo [redacted] week gestation patient who came in with N/V and c/o breakthrough seizure on 07/26/20. EEG to evaluate for seizure Level of alertness: Awake AEDs during EEG study: LEV Technical aspects: This EEG study was done with scalp electrodes positioned according to the 10-20 International system of electrode placement. Electrical activity was acquired at a sampling rate of 500Hz  and reviewed with a high frequency filter of 70Hz  and a low frequency filter of 1Hz . EEG data were recorded continuously and digitally stored. Description: The posterior dominant rhythm consists of 9 Hz activity of moderate voltage (25-35 uV) seen predominantly in posterior head regions, symmetric and reactive to eye opening and eye closing. Hyperventilation and photic stimulation were not performed.   IMPRESSION: This study is within normal limits. No seizures or epileptiform discharges were seen throughout the recording. Priyanka 09/25/20   Overnight EEG with video  Result Date: 08/02/2020 , MD  08/02/2020  3:43 PM Patient Name: Shruthi Northrup MRN: [id number] Epilepsy Attending: Charlsie Quest Referring Physician/Provider: Lanae Boast, NP Duration: 08/01/2020 1815 to 16/19/2022 1528  Patient  history: 35 yo [redacted] week gestation patient who came in with N/V and c/o breakthrough seizure on 07/26/20. EEG to evaluate for seizure  Level of alertness: Awake, asleep  AEDs during EEG study: LEV  Technical aspects: This EEG study was done with scalp electrodes positioned according to the 10-20 International system of electrode placement. Electrical activity was acquired at a sampling rate of 500Hz  and reviewed with a high frequency filter of 70Hz  and a low frequency filter of 1Hz . EEG data were recorded continuously and digitally stored.  Description: The posterior dominant rhythm consists of 9 Hz activity of moderate voltage (25-35 uV) seen predominantly in posterior head regions, symmetric and reactive to eye opening and eye closing. Sleep was characterized by sleep spindles (12-14hz ) maximal frontocentral region. Hyperventilation and photic stimulation were not performed.   Event button was pressed on 08/02/2020 at 0825 for staring. Per RN, patient was taring and not responding for about 30 seconds. Concomitant EEG before, during and after the event showed normal posterior dominant rhythm.  IMPRESSION: This study is within normal limits. No seizures or epileptiform discharges were seen throughout the recording. One episode of staring was noted on 08/02/2020 at 0825 without concomitant eeg change and was NOT epileptic.   Priyanka     Medications:  Scheduled  enoxaparin (LOVENOX) injection  50 mg Subcutaneous Q24H   escitalopram  10 mg Oral Daily   famotidine  20 mg Oral Q12H   hydrOXYzine  25 mg Intramuscular Once   levETIRAcetam  500 mg Oral BID   pyridOXINE  100 mg Intravenous Daily   scopolamine  1 patch Transdermal Q72H   Vitamin D (Ergocalciferol)  50,000 Units Oral Q7 days   I have reviewed the patient's current medications.  ASSESSMENT: 08/04/2020 Estimated Date of Delivery: 11/04/20   Principal Problem:   Nausea and vomiting during pregnancy Active Problems:   History of 3 cesarean  sections   Seizure disorder during pregnancy in second trimester (HCC)   Chronic hypertension affecting pregnancy   Posttraumatic stress disorder   BV (bacterial vaginosis)   Low vitamin D level   Anxiety and depression  PLAN:  Will remove feeding tube today and continue regular diet. Appreciate Nutrition Specialist recommendations.  Continue anti-nausea medications for now. Will need IV fluid and antiemetic infusion at home, PICC line needs to be placed as per Care Management. Order placed for this.  Appreciate Neurology evaluation and input, will consult as needed. Negative evaluation thus far. Continue Keppra and other medications as recommended  Appreciate Psychiatry recommendations; given history of anxiety, depression, PTSD and there is great concern about psychosomatic symptoms. Lexapro initiated yesterday, patient needs outpatient follow up with psychotherapist at the [facility],   Appreciate Physical Therapy input given patient's mobility issues. Patient is still ambulating with assistance of walker. Of note, no ambulation issues prior to admission.   Reassuring FHR tracing for gestational age. Stable BP.  No acute OB issues.  Continue routine antenatal care, will hope to discharge her soon. Appreciate Care Management help in arranging for anything patient needs at home.    [id number], MD

## 2020-08-05 NOTE — Progress Notes (Signed)
Peripherally Inserted Central Catheter Placement  The IV Nurse has discussed with the patient and/or persons authorized to consent for the patient, the purpose of this procedure and the potential benefits and risks involved with this procedure.  The benefits include less needle sticks, lab draws from the catheter, and the patient may be discharged home with the catheter. Risks include, but not limited to, infection, bleeding, blood clot (thrombus formation), and puncture of an artery; nerve damage and irregular heartbeat and possibility to perform a PICC exchange if needed/ordered by physician.  Alternatives to this procedure were also discussed.  Bard Power PICC patient education guide, fact sheet on infection prevention and patient information card has been provided to patient /or left at bedside.    PICC Placement Documentation  PICC Single Lumen 08/05/20 Right Brachial 35 cm 1 cm (Active)  Indication for Insertion or Continuance of Line Home intravenous therapies (PICC only) 08/05/20 1200  Exposed Catheter (cm) 1 cm 08/05/20 1200  Site Assessment Clean;Dry;Intact 08/05/20 1200  Line Status Flushed;Saline locked;Blood return noted 08/05/20 1200  Dressing Type Transparent;Securing device 08/05/20 1200  Dressing Status Clean;Dry;Intact 08/05/20 1200  Antimicrobial disc in place? Yes 08/05/20 1200  Safety Lock Not Applicable 08/05/20 1200  Line Care Connections checked and tightened 08/05/20 1200  Dressing Intervention New dressing 08/05/20 1200  Dressing Change Due 08/12/20 08/05/20 1200       Franne Grip Renee 08/05/2020, 12:07 PM

## 2020-08-05 NOTE — Care Management (Addendum)
CM spoke to [facility] CSW # [phone] pager [phone] regarding patient's discharge needs. Jasmine has information to send to PCP and they will be in touch with [facility] when Berkley Harvey is approved.  CM Pam with [facility] will start home infusion teaching -with patient today and tomorrow at noon. [facility] will be the nursing agency contracted with them and can start on July 5th and will do dressing PICC change in the home that day.  Patient will need dressing change next week 08/12/20 Wednesday until [facility] available in the home. CM spoke to Dr. Macon Large and informed her of above info.  She will arrange patient to return to [facility] on 08/12/20 Wednesday and have IV team change PICC dressing.  CM spoke to patient and she is in agreement with plan and she informed CM that her husband will be able to be here tomorrow around noon for IV home infusion teaching in the room.  Awaiting Auth from the [place] prior to patient being discharge.    Gretchen Short RNC-MNN, BSN Transitions of Care Pediatrics/[facility]

## 2020-08-05 NOTE — Discharge Summary (Signed)
Antenatal Physician Discharge Summary  Patient ID: Leah Shea MRN: [id number] DOB/AGE: 09-06-1985 35 y.o.  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 [phone] with IUP at admitted for  concern about nausea and vomiting and also possible seizure activity. For her seizures, she reports being diagnosed at the [place] and is on anti-seizure medications. However, given her nausea and vomiting, she reports not taking the medications for weeks. Also endorsed severe headaches requiring Dilaudid administration and multiple neurological symptoms (lethargy, right facial paresis, left upper and lower extremity tingling, forehead numbness); these symptoms developed while she was here. She was observed and had extensive evaluation involving EEG and head imaging over several days, while being continued on her Keppra.  After many days and studies, the Neurologist felt that her 'spells' were not consistent with epileptic seizures; there was no witnessed seizure activity by staff and no activity was captured on EEG. Because of the the reported weakness, she started ambulating with aid of a walker and necessitated PT evaluation and treatment. The impression was of a possible complex migraine but there was also concern about psychological disorder contributing to her presentation and complaints of  increased anxiety since hospitalization.  On 08/03/20, Psychiatry was consulted. Their impression was that her symptoms could be physical manifestations of her severe PTSD, anxiety and depression.  They started her on Lexapro 10 mg daily, and recommended outpatient PTSD therapy at the [facility] where patient receives psychiatric care. Of note, patient has been scheduled to follow up with our [facility] clinician at the [facility] office on 08/11/20.   Patient will also follow up with her neurologist and was given Physical Therapy walking aids to use at home. It should be noted that patient had no ambulatory issues prior to this admission; no known etiology seen on neurologic evaluation.  As for the nausea and vomiting, she could not tolerate any oral intake despite multiple antiemetics and kept vomiting. After multiple regimens, a feeding tube was placed on 07/31/20 and Nutrition specialists helped set up tube feeds. Patient was able to tolerate clear sips on 08/03/20, and was advanced to regular diet on 08/04/20. On 08/05/20, patient agreed to have feeding tube removed but insisted on getting IV fluids + IV Phenergan infusions as this helped her a lot.  This was also set up for her to receive at home; a single-lumen PICC line was placed for this reason. Patient and her husband (who is a Marine scientist) were taught about the IV fluid infusions at home, and the [place] agency will also help with this.  It was ordered for one month; after this time, her physician providers in office will determine if she needs further therapy.  Patient needs weekly PICC line dressing changes, a different nursing service will do this starting 08/18/20. As a result, arrangements were made for her to come to [facility] on 08/12/20 for PICC line dressing change; PICC team would  be called when she comes in to change this dressing accordingly.  By time of discharge, patient was tolerating regular diet without any concerns and taking antiemetics as needed.  During  her admission, her fetal heart rate monitoring remained reassuring, and she had no signs/symptoms of preterm labor or other maternal-fetal concerns.  She was deemed stable for discharge to home on 08/06/2020 with home health services and close outpatient follow up.  Discharge Exam: Temp:  [98 F (36.7 C)-98.4 F (36.9 C)] 98 F (36.7 C) (06/23 0745) Pulse Rate:  [80-86] 84 (06/23 0745) Resp:  [18] 18 (06/23 0745) BP: (114-126)/(59-66) 123/59 (06/23 0745) SpO2:  [97 %-99 %] 97 % (06/23 0745) Physical Examination: CONSTITUTIONAL: Well-developed, well-nourished female in no acute distress.  NECK: Normal range of motion, supple, no masses SKIN: Skin is warm and dry. No rash noted. Not diaphoretic. No erythema. No pallor. [place]: Alert and oriented to person, place, and time. Normal reflexes, muscle tone coordination. No cranial nerve deficit noted. PSYCHIATRIC: Depressed mood and affect. Normal behavior.  CARDIOVASCULAR: Normal heart rate noted, regular rhythm RESPIRATORY: Effort and breath sounds normal, no problems with respiration noted MUSCULOSKELETAL: Normal range of motion. No edema and no tenderness. 2+ distal pulses. ABDOMEN: Soft, nontender, nondistended, gravid. CERVIX:  Deferred  Fetal monitoring: FHR: 140 bpm, Variability: moderate, Accelerations: Present, Decelerations: Absent  Uterine activity: No contractions  Significant Diagnostic Studies:  Results for orders placed or performed during the hospital encounter of 07/25/20 (from the past 168 hour(s))  Vitamin B12   Collection Time: 07/31/20  8:40 AM  Result Value Ref Range   Vitamin B-12 327 180 - 914 pg/mL  Iron and TIBC   Collection Time: 07/31/20  8:40 AM  Result Value Ref Range   Iron 91 28 - 170 ug/dL   TIBC 374 250 - 450 ug/dL   Saturation Ratios 24 10.4 - 31.8 %   UIBC 283 ug/dL  Ferritin   Collection Time: 07/31/20  8:40 AM  Result Value Ref Range   Ferritin 56 11 - 307 ng/mL  Reticulocytes   Collection  Time: 07/31/20  8:40 AM  Result Value Ref Range   Retic Ct Pct 2.3 0.4 - 3.1 %   RBC. 3.07 (L) 3.87 - 5.11 MIL/uL   Retic Count, Absolute 70.6 19.0 - 186.0 K/uL   Immature Retic Fract 18.1 (H) 2.3 - 15.9 %  Folate   Collection Time: 07/31/20  8:40 AM  Result Value Ref Range   Folate 15.4 >5.9 ng/mL  Basic metabolic panel   Collection Time: 08/01/20  4:13 AM  Result Value Ref Range   Sodium 136 135 - 145 mmol/L   Potassium 3.2 (L) 3.5 - 5.1 mmol/L   Chloride 107 98 - 111 mmol/L   CO2 22 22 - 32 mmol/L   Glucose, Bld 108 (H) 70 - 99 mg/dL   BUN <5 (L) 6 - 20 mg/dL   Creatinine, Ser 0.66 0.44 - 1.00 mg/dL   Calcium 8.7 (L) 8.9 - 10.3 mg/dL   GFR, Estimated >60 >60 mL/min   Anion gap 7 5 - 15  CBC   Collection Time: 08/01/20  4:13 AM  Result Value Ref Range   WBC 6.1 4.0 - 10.5 K/uL   RBC 2.99 (L) 3.87 - 5.11 MIL/uL   Hemoglobin 9.2 (L) 12.0 - 15.0 g/dL   HCT 26.9 (L) 36.0 - 46.0 %   MCV 90.0 80.0 - 100.0 fL   MCH 30.8 26.0 - 34.0 pg   MCHC  34.2 30.0 - 36.0 g/dL   RDW 12.0 11.5 - 15.5 %   Platelets 148 (L) 150 - 400 K/uL   nRBC 0.0 0.0 - 0.2 %   CT HEAD WO CONTRAST  Result Date: 08/01/2020 CLINICAL DATA:  Persistent mental status change EXAM: CT HEAD WITHOUT CONTRAST TECHNIQUE: Contiguous axial images were obtained from the base of the skull through the vertex without intravenous contrast. COMPARISON:  07/10/2020 FINDINGS: Brain: No acute infarct or hemorrhage. Lateral ventricles and midline structures are unremarkable. No acute extra-axial fluid collections. No mass effect. Vascular: No hyperdense vessel or unexpected calcification. Skull: Normal. Negative for fracture or focal lesion. Sinuses/Orbits: No acute finding. Other: None. IMPRESSION: 1. No acute intracranial process. Electronically Signed   By: Randa Ngo M.D.   On: 08/01/2020 21:15   MR BRAIN WO CONTRAST  Result Date: 07/10/2020 CLINICAL DATA:  Initial evaluation for neuro deficit, stroke suspected. EXAM: MRI  HEAD WITHOUT CONTRAST TECHNIQUE: Multiplanar, multiecho pulse sequences of the brain and surrounding structures were obtained without intravenous contrast. COMPARISON:  None available. FINDINGS: Brain: Examination is limited with only axial coronal DWI sequence with axial FLAIR sequence performed. Cerebral volume within normal limits. No focal parenchymal signal abnormality or significant cerebral white matter disease seen on FLAIR sequence. No abnormal foci of restricted diffusion to suggest acute or subacute ischemia. Gray-white matter differentiation maintained. No areas of remote infarction visualized. No mass lesion, midline shift or mass effect. No hydrocephalus or extra-axial fluid collection. Vascular: Major intracranial vascular flow voids are grossly maintained at the skull base. Skull and upper cervical spine: Scalp soft tissues and calvarium within normal limits. Sinuses/Orbits: Globes and orbital soft tissues within normal limits. Paranasal sinuses and mastoid air cells are largely clear. Other: None. IMPRESSION: Negative limited MRI of the brain. No acute intracranial infarct or other abnormality. Electronically Signed   By: Jeannine Boga M.D.   On: 07/10/2020 22:50   MR Venogram Head  Result Date: 07/11/2020 CLINICAL DATA:  Initial evaluation for acute headache, left-sided numbness. EXAM: MR VENOGRAM HEAD WITHOUT CONTRAST TECHNIQUE: Angiographic images of the intracranial venous structures were acquired using MRV technique without intravenous contrast. COMPARISON:  Prior MRI from 07/10/2020. FINDINGS: Normal flow related signal seen throughout the superior sagittal sinus to the torcula. Right transverse and sigmoid sinuses are patent as is the visualized proximal right internal jugular vein. Left transverse and sigmoid sinuses are diffusely hypoplastic but grossly patent as well. No appreciable focal stenosis. Visualized proximal left internal jugular vein patent. Straight sinus, vein of  [place], internal cerebral veins, and basal veins of Rosenthal appear patent. No evidence for dural sinus thrombosis. IMPRESSION: Negative intracranial MRV.  No evidence for dural sinus thrombosis. Electronically Signed   By: Jeannine Boga M.D.   On: 07/11/2020 03:38   DG Chest Port 1 View  Result Date: 07/27/2020 CLINICAL DATA:  Chest pressure, pregnant. EXAM: PORTABLE CHEST 1 VIEW COMPARISON:  01/27/2020. FINDINGS: Trachea is midline. Heart size is accentuated by AP semi upright technique. Central pulmonary markings are accentuated by low lung volumes. No airspace consolidation or pleural fluid. IMPRESSION: No acute findings. Electronically Signed   By: Lorin Picket M.D.   On: 07/27/2020 11:35   DG Abd Portable 1V  Result Date: 07/31/2020 CLINICAL DATA:  Feeding tube insertion EXAM: PORTABLE ABDOMEN - 1 VIEW COMPARISON:  Portable exam 1445 hours compared to CT abdomen and pelvis of 12/24/2018 FINDINGS: Tip of feeding tube projects over duodenal bulb. Scattered gas and stool in colon. Fetus within  distended uterus. Osseous structures unremarkable. IMPRESSION: Tip of feeding tube projects over duodenal bulb. Electronically Signed   By: Lavonia Dana M.D.   On: 07/31/2020 15:23   EEG adult  Result Date: 08/01/2020 Lora Havens, MD     08/01/2020  6:24 PM Patient Name: Anjolie Majer MRN: [id number] Epilepsy Attending: Lora Havens Referring Physician/Provider: Anibal Henderson, NP Date: 08/01/2020 Duration: 22.12 mins Patient history: 35 yo [redacted] week gestation patient who came in with N/V and c/o breakthrough seizure on 07/26/20. EEG to evaluate for seizure Level of alertness: Awake AEDs during EEG study: LEV Technical aspects: This EEG study was done with scalp electrodes positioned according to the 10-20 International system of electrode placement. Electrical activity was acquired at a sampling rate of _0  and reviewed with a high frequency filter of _1  and a low frequency filter of _2 .  EEG data were recorded continuously and digitally stored. Description: The posterior dominant rhythm consists of 9 Hz activity of moderate voltage (25-35 uV) seen predominantly in posterior head regions, symmetric and reactive to eye opening and eye closing. Hyperventilation and photic stimulation were not performed.   IMPRESSION: This study is within normal limits. No seizures or epileptiform discharges were seen throughout the recording. Priyanka Barbra Sarks   Overnight EEG with video  Result Date: 08/02/2020 Lora Havens, MD     08/02/2020  3:43 PM Patient Name: Shephanie Romas MRN: [id number] Epilepsy Attending: Lora Havens Referring Physician/Provider: Anibal Henderson, NP Duration: 08/01/2020 1815 to 16/19/2022 1528  Patient history: 35 yo [redacted] week gestation patient who came in with N/V and c/o breakthrough seizure on 07/26/20. EEG to evaluate for seizure  Level of alertness: Awake, asleep  AEDs during EEG study: LEV  Technical aspects: This EEG study was done with scalp electrodes positioned according to the 10-20 International system of electrode placement. Electrical activity was acquired at a sampling rate of _3  and reviewed with a high frequency filter of _4  and a low frequency filter of _5 . EEG data were recorded continuously and digitally stored.  Description: The posterior dominant rhythm consists of 9 Hz activity of moderate voltage (25-35 uV) seen predominantly in posterior head regions, symmetric and reactive to eye opening and eye closing. Sleep was characterized by sleep spindles (12-_6 ) maximal frontocentral region. Hyperventilation and photic stimulation were not performed.   Event button was pressed on 08/02/2020 at 0825 for staring. Per RN, patient was taring and not responding for about 30 seconds. Concomitant EEG before, during and after the event showed normal posterior dominant rhythm.  IMPRESSION: This study is within normal limits. No seizures or epileptiform discharges  were seen throughout the recording. One episode of staring was noted on 08/02/2020 at 0825 without concomitant eeg change and was NOT epileptic.   Priyanka O Yadav   [place] EKG SITE RITE  Result Date: 08/05/2020 If [facility] image not attached, placement could not be confirmed due to current cardiac rhythm.  [place] MFM OB FOLLOW UP  Result Date: 07/21/2020 ----------------------------------------------------------------------  OBSTETRICS REPORT                       (Signed Final 07/21/2020 04:09 pm) ---------------------------------------------------------------------- Patient Info  ID #:       [id number]                          D.O.B.:  [birth year] (34 yrs)  Name:       Ky Barban  MESHO [place]              Visit Date: 07/21/2020 02:46 pm ---------------------------------------------------------------------- Performed By  Attending:        Sander Nephew      Ref. Address[facility]  Performed By:     Rodrigo Ran BS      [facility]                                                             [place] [facility]                                                             [facility]  Referred By:      Griffin Basil MD ---------------------------------------------------------------------- Orders  #  Description                           Code        Ordered By  1  [place] MFM OB FOLLOW UP                   30051.10    [place] ----------------------------------------------------------------------  #  Order #                     Accession #                Episode #  1  [id number]                   [id number]                 [id number] ---------------------------------------------------------------------- Indications  [redacted] weeks gestation of pregnancy  Z3A.24  Encounter for other antenatal screening        Z36.2  follow-up  Obesity complicating pregnancy, second         O99.212  trimester (BMI 32)  History of cesarean delivery, currently        O34.219  pregnant (x3)  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 Cord Insertion:      Visualized  Amniotic Fluid  AFI FV:      Within normal limits                              Largest Pocket(cm)                              7.3 ---------------------------------------------------------------------- Biometry  BPD:      59.3  mm     G. Age:  24w 2d         30  %    CI:        72.91   %    70 - 86                                                          FL/HC:      19.4   %    18.7 - 20.3  HC:      220.8  mm     G. Age:  24w 1d         16  %    HC/AC:      1.12        1.04 - 1.22  AC:      196.7  mm     G. Age:  24w 2d         34  %    FL/BPD:     72.3   %    71 - 87  FL:       42.9  mm     G. Age:  24w 0d         21  %    FL/AC:      21.8   %    20 - 24  HUM:      40.7  mm     G. Age:  24w 5d         45  %  LV:        3.8  mm  Est. FW:     671  gm      1 lb 8 oz  25  % ---------------------------------------------------------------------- OB History  Gravidity:    7  Living:       4 ---------------------------------------------------------------------- Gestational Age  LMP:           24w 6d        Date:  01/29/20                 EDD:   11/04/20  U/S Today:     24w 1d                                        EDD:   11/09/20  Best:          24w 4d     Det. By:  U/S C R L   (04/08/20)    EDD:   11/06/20 ---------------------------------------------------------------------- Anatomy  Cranium:               Appears normal         Aortic Arch:            Appears normal  Cavum:                 Appears normal         Ductal Arch:            Appears normal  Ventricles:            Appears normal         Diaphragm:              Appears normal  Choroid Plexus:        Appears normal         Stomach:                Appears normal, left                                                                        sided  Cerebellum:            Appears normal         Abdomen:                Appears normal  Posterior Fossa:       Previously seen        Abdominal Wall:         Previously seen  Nuchal Fold:           Previously seen        Cord Vessels:           Previously seen  Face:                  Appears normal         Kidneys:                Appear normal                         (orbits and profile)  Lips:                  Appears normal  Bladder:                Appears normal  Thoracic:              Appears normal         Spine:                  Previously seen  Heart:                 Appears normal         Upper Extremities:      Appears normal                         (4CH, axis, and                         situs)  RVOT:                  Appears normal         Lower Extremities:      Previously seen  LVOT:                  Appears normal  Other:  Fetus appears to be a female. Heels and RIGHT open hands/5th digits          visualized. Lenses visualized. Nasal bone visualized. VC, 3VV and          3VTV visualized. ---------------------------------------------------------------------- Cervix Uterus Adnexa  Cervix  Length:           3.54  cm.  Normal appearance by transabdominal scan.  Uterus  No abnormality visualized.  Right Ovary  Within normal limits.  Left Ovary  Within normal limits.  Cul De Sac  No free fluid seen.  Adnexa  No abnormality visualized.  ---------------------------------------------------------------------- Impression  Follow up growth to complete the fetal anatomy.  Normal interval growth with measurements consistent with  dates  Good fetal movement and amniotic fluid volume  Ms. Rohlman has a known history of seizure disoder. She had a  recent seizure on Saturday, again a short lived focal seizure.  She notes that she is taking her medication regularly, but has  not had her levels drawn recently and also has persistent  nausea and vomiting. She is able to keep some food and  ensure down.  I encouraged her to request to have a her Lamictal levels  drawn as the levels can changed during the pregnancy thus  developing subtherapeutic levels.  Secondly, if her N/V is persistent and not improved consider  inpatient admission for IV hydration and IV antiemetics also a  suppository may be of help.  I discussed these options with Ms. Ehrsam, she prefers to  continue her current strategy with the hopes for self  resolution. ---------------------------------------------------------------------- Recommendations  Follow up growth was scheduled in 4 weeks. ----------------------------------------------------------------------               Sander Nephew, MD Electronically Signed Final Report   07/21/2020 04:09 pm ----------------------------------------------------------------------   Future Appointments  Date Time Provider [place]  08/11/2020  9:00 AM Lynnea Ferrier, LCSW [facility] None  08/13/2020  2:45 PM Sloan Leiter, MD [place] None  08/18/2020  3:45 PM [facility] NURSE [facility]  08/18/2020  4:00 PM [facility] US1 [facility]    Discharge Condition: Stable  Discharge disposition: 06-Home-Health Care Svc       Allergies as of 08/06/2020  Reactions   Other Anaphylaxis, Hives   Mushrooms   Nsaids Hives, Swelling, Other (See Comments)   Body burns   Reglan [metoclopramide] Other (See Comments)   anxiety   Sulfa Antibiotics  Other (See Comments)   burns        Medication List     STOP taking these medications    lamoTRIgine 25 MG tablet Commonly known as: LaMICtal       TAKE these medications    acetaminophen 500 MG tablet Commonly known as: TYLENOL Take 2 tablets (1,000 mg total) by mouth every 8 (eight) hours as needed for moderate pain or fever.   Blood Pressure Kit Devi 1 kit by Does not apply route once a week.   busPIRone 15 MG tablet Commonly known as: BUSPAR Take by mouth.   diphenhydrAMINE 50 MG tablet Commonly known as: BENADRYL Take 0.5 tablets (25 mg total) by mouth at bedtime as needed for sleep.   Doxylamine-Pyridoxine 10-10 MG Tbec Commonly known as: Diclegis Take 2 tablets by mouth at bedtime. If symptoms persist, add one tablet in the morning and one in the afternoon   escitalopram 10 MG tablet Commonly known as: LEXAPRO Take by mouth. What changed: Another medication with the same name was added. Make sure you understand how and when to take each.   escitalopram 10 MG tablet Commonly known as: LEXAPRO Take 1 tablet (10 mg total) by mouth daily. Start taking on: August 07, 2020 What changed: You were already taking a medication with the same name, and this prescription was added. Make sure you understand how and when to take each.   folic acid 1 MG tablet Commonly known as: FOLVITE Take 1 tablet (1 mg total) by mouth daily.   hydrOXYzine 25 MG capsule Commonly known as: VISTARIL Take 2 capsules (50 mg total) by mouth 3 (three) times daily as needed for anxiety.   levETIRAcetam 500 MG tablet Commonly known as: Keppra Take 1 tablet (500 mg total) by mouth every 12 (twelve) hours. What changed: Another medication with the same name was added. Make sure you understand how and when to take each.   levETIRAcetam 500 MG tablet Commonly known as: KEPPRA Take 1 tablet (500 mg total) by mouth 2 (two) times daily. What changed: You were already taking a medication with  the same name, and this prescription was added. Make sure you understand how and when to take each.   ondansetron 8 MG disintegrating tablet Commonly known as: Zofran ODT Take 1 tablet (8 mg total) by mouth every 8 (eight) hours as needed for nausea or vomiting.   pantoprazole 40 MG tablet Commonly known as: Protonix Take 1 tablet (40 mg total) by mouth daily.   promethazine 25 MG tablet Commonly known as: PHENERGAN Take 1 tablet (25 mg total) by mouth every 6 (six) hours as needed for nausea or vomiting.   scopolamine 1 MG/3DAYS Commonly known as: TRANSDERM-SCOP Place 1 patch (1.5 mg total) onto the skin every 3 (three) days. What changed: Another medication with the same name was added. Make sure you understand how and when to take each.   scopolamine 1 MG/3DAYS Commonly known as: TRANSDERM-SCOP Place 1 patch  onto the skin every 3 (three) days. What changed: You were already taking a medication with the same name, and this prescription was added. Make sure you understand how and when to take each.   Vitamin D (Ergocalciferol) 1.25 MG (50000 UNIT) Caps capsule Commonly known as: DRISDOL Take 1 capsule (  50,000 Units total) by mouth every 7 (seven) days. Start taking on: August 11, 2020               Durable Medical Equipment  (From admission, onward)           Start     Ordered   08/03/20 1047  For home use only DME Shower stool  Once        08/03/20 1046   08/03/20 1044  For home use only DME Walker rolling  Once       Question Answer Comment  Walker: With 5 Inch Wheels   Patient needs a walker to treat with the following condition Hyperesthesia      08/03/20 1046            Follow-up Information     [facility] Follow up on 08/12/2020.   Specialty: Obstetrics and Gynecology Why: For PICC line dressing change Contact information: [address] [id number] [place] [phone]                Future Appointments  Date Time Provider Devola  08/11/2020  9:00 AM Lynnea Ferrier, [place] [facility] None  08/13/2020  2:45 PM Sloan Leiter, MD [place] None  08/18/2020  3:45 PM [facility] NURSE [facility]  08/18/2020  4:00 PM [facility] US1 [facility]    Total discharge time: 1 hour   Signed: Verita Schneiders M.D. 08/06/2020, 2:39 PM

## 2020-08-05 NOTE — Consult Note (Signed)
[facility]   Reason for Consult: Concern for personality disorder vs secondary gain Referring Physician:  Jaynie CollinsUgonna Anyanwu, MD Patient Identification: Leah ApoMyoshi Meshon Shea MRN:  [id number] Principal Diagnosis: Nausea and vomiting during pregnancy Diagnosis:  Principal Problem:   Nausea and vomiting during pregnancy Active Problems:   History of 3 cesarean sections   Seizure disorder during pregnancy in second trimester (HCC)   Chronic hypertension affecting pregnancy   Posttraumatic stress disorder   BV (bacterial vaginosis)   Low vitamin D level   Anxiety and depression   [redacted] weeks gestation of pregnancy   Total Time spent with patient: 15 minutes  Subjective:   Leah Shea is a 35 y.o. female patient admitted with  with n/v and dehydration and reporting seizures w/ a hx of PTSD, depression, and anxiety. At the time of referral patient has been hospitalized 11 days and is currently on feeding tube and requires assistance with ambulation despite being ambulatory without assistance at beginning of her hospitalization. Neurology has also seen patient and underwent EEG LTM who concluded (with episode button) that the events that patient was reporting as seizures did not appear to be epileptic.  HPI:  Overnight patient succeeded with her PO trial and has been declared stable for NG tube removal. Patient will continue to receive OP fluid resuscitation. On assessment this AM the patient reports that she is doing "ok" but does not wish to open the blinds this morning. Provider notes that patient appears cleaner and appears more upbeat but patient reports she does not feel this way. Patient reports she is happy that she was able to accomplish all of her goals yesterday and is hoping to journal today and continue to to be nice to staff.  Patient and provider talked about patient continuing her goal setting and finding things to keep her mood elevated. Initially patient  had a hard time finding something that was possible to do now that would make her happy. However, she finally came up with playing with her dog. Provider and patient also spoke about the impact that patient's depression can have on her children and patient believed she was hiding it from them but agreed that depression does keep her from being as active in their life. Patient reports she will work to make active decisions to decrease the incidence of her taking the "easy" route and secluding herself from her family.  Patient denied SI, HI, and AVH and reports she is ready to return home.  Patient reports she will continue to be complaint with her Lexapro and [place] [facility] [place] [facility] [place] [facility] [place].  Past Psychiatric History: PTSD, Depression, and [facility] 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   Transferred from DOD/VA problem list   PTSD (post-traumatic stress disorder)    Shortness  of breath    with exertion or panic attack   Sickle cell trait (HCC)    Sleep apnea    awaiting a sleep study to be done at [facility]    Past Surgical History:  Procedure Laterality Date   BREAST SURGERY Bilateral    reductions   CESAREAN SECTION  2010, 2012,2014   x 3   EYE SURGERY     x 10 as a child   PILONIDAL CYST EXCISION  2020   TONSILLECTOMY N/A 10/07/2013   Procedure: TONSILLECTOMY;  Surgeon: Christia Reading, MD;  Location[facility] OR;   Service: ENT;  Laterality: N/A;   TUMOR REMOVAL     tumor removed from back   Family History:  Family History  Problem Relation Age of Onset   Breast cancer Mother    Breast cancer Maternal Aunt 36       metastatic   Cancer Maternal Uncle        unk type   Breast cancer Maternal Grandmother    Prostate cancer Maternal Grandfather        metastatic   Cancer Maternal Aunt        either breast or ovarian    Family Psychiatric  History: None officially diagnosed, but patient reports some mood disorders Social History:  Social History   Substance and Sexual Activity  Alcohol Use Not Currently     Social History   Substance and Sexual Activity  Drug Use Not Currently   Types: Marijuana   Comment: last used January 3    Social History   Socioeconomic History   Marital status: Married    Spouse name: Not on file   Number of children: Not on file   Years of education: Not on file   Highest education level: Not on file  Occupational History   Not on file  Tobacco Use   Smoking status: Former    Packs/day: 0.25    Years: 1.00    Pack years: 0.25    Types: Cigarettes    Quit date: 07/02/2013    Years since quitting: 7.0   Smokeless tobacco: Never  Vaping Use   Vaping Use: Never used  Substance and Sexual Activity   Alcohol use: Not Currently   Drug use: Not Currently    Types: Marijuana    Comment: last used January 3   Sexual activity: Yes    Partners: Male    Birth control/protection: None  Other Topics Concern   Not on file  Social History Narrative   Not on file   Social Determinants of Health   Financial Resource Strain: Not on file  Food Insecurity: Not on file  Transportation Needs: Not on file  Physical Activity: Not on file  Stress: Not on file  Social Connections: Not on file   Additional Social History:    Allergies:   Allergies  Allergen Reactions   Other Anaphylaxis and Hives    Mushrooms   Nsaids Hives, Swelling and Other (See  Comments)    Body burns    Reglan [Metoclopramide] Other (See Comments)    anxiety   Sulfa Antibiotics Other (See Comments)    burns    Labs: No results found for this or any previous visit (from the past 48 hour(s)).  Current Facility-Administered Medications  Medication Dose Route Frequency Provider Last Rate Last Admin   acetaminophen (TYLENOL) tablet 650 mg  650 mg Oral Q4H PRN Anyanwu, Jethro Bastos, MD       diphenhydrAMINE (  BENADRYL) injection 25 mg  25 mg Intravenous Q6H PRN Anyanwu, Ugonna A, MD   25 mg at 08/03/20 0956   enoxaparin (LOVENOX) injection 50 mg  50 mg Subcutaneous Q24H [place] Bing, MD   50 mg at 08/05/20 1610   escitalopram (LEXAPRO) tablet 10 mg  10 mg Oral Daily Eliseo Gum B, MD   10 mg at 08/05/20 1030   famotidine (PEPCID) tablet 20 mg  20 mg Oral Q12H Warden Fillers, MD   20 mg at 08/05/20 1030   Or   famotidine (PEPCID) IVPB 20 mg premix  20 mg Intravenous Q12H Warden Fillers, MD 100 mL/hr at 08/04/20 2129 20 mg at 08/04/20 2129   feeding supplement (OSMOLITE 1.2 CAL) liquid 1,000 mL  1,000 mL Per Tube Continuous [place] Bing, MD 75 mL/hr at 08/05/20 0129 1,000 mL at 08/05/20 0129   hydrOXYzine (VISTARIL) injection 25 mg  25 mg Intramuscular Once Lazaro Arms, MD       lactated ringers infusion   Intravenous Continuous Warden Fillers, MD 100 mL/hr at 08/05/20 0901 New Bag at 08/05/20 0901   levETIRAcetam (KEPPRA) tablet 500 mg  500 mg Oral BID Anyanwu, Ugonna A, MD       loperamide (IMODIUM) capsule 2 mg  2 mg Oral PRN Warden Fillers, MD   2 mg at 07/26/20 0920   LORazepam (ATIVAN) injection 2 mg  2 mg Intravenous Q8H PRN Lazaro Arms, MD   2 mg at 08/04/20 2122   ondansetron (ZOFRAN) 8 mg in sodium chloride 0.9 % 50 mL IVPB  8 mg Intravenous Q8H Warden Fillers, MD 216 mL/hr at 08/04/20 2317 8 mg at 08/04/20 2317   phenol (CHLORASEPTIC) mouth spray 2 spray  2 spray Mouth/Throat PRN Reva Bores, MD   2 spray at 08/01/20 2119    promethazine (PHENERGAN) 25 mg in sodium chloride 0.9 % 50 mL IVPB  25 mg Intravenous Q6H Warden Fillers, MD 200 mL/hr at 08/05/20 1032 25 mg at 08/05/20 1032   pyridOXINE (B-6) injection 100 mg  100 mg Intravenous Daily Constant, Peggy, MD   100 mg at 08/05/20 1033   scopolamine (TRANSDERM-SCOP) 1 MG/3DAYS 1.5 mg  1 patch Transdermal Q72H Cleone Slim M, CNM   1.5 mg at 08/03/20 1426   Vitamin D (Ergocalciferol) (DRISDOL) capsule 50,000 Units  50,000 Units Oral Q7 days Anyanwu, Jethro Bastos, MD   50,000 Units at 08/04/20 1458    Musculoskeletal: Strength & Muscle Tone: decreased Gait & Station:  remains in bed Patient leans: N/A            Psychiatric Specialty Exam:  Presentation  General Appearance: Appropriate for Environment  Eye Contact:Good  Speech:Clear and Coherent  Speech Volume:Decreased  Handedness: No data recorded  Mood and Affect  Mood:Dysphoric  Affect:Flat   Thought Process  Thought Processes:Coherent  Descriptions of Associations:Intact  Orientation:Full (Time, Place and Person)  Thought Content:Logical  History of Schizophrenia/Schizoaffective disorder:No data recorded Duration of Psychotic Symptoms:No data recorded Hallucinations:Hallucinations: None  Ideas of Reference:None  Suicidal Thoughts:Suicidal Thoughts: No  Homicidal Thoughts:Homicidal Thoughts: No   Sensorium  Memory:Immediate Good; Recent Good; Remote Good  Judgment:-- (Improving)  Insight:Shallow   Executive Functions  Concentration:Good  Attention Span:Good  Recall:Good  Fund of Knowledge:Good  Language:Good   Psychomotor Activity  Psychomotor Activity:Psychomotor Activity: Psychomotor Retardation   Assets  Assets:Communication Skills; Resilience   Sleep  Sleep:Sleep: Fair   Physical Exam: Physical Exam Constitutional:  Appearance: Normal appearance.  HENT:     Head: Normocephalic and atraumatic.  Eyes:     Extraocular Movements:  Extraocular movements intact.     Conjunctiva/sclera: Conjunctivae normal.  Cardiovascular:     Rate and Rhythm: Normal rate.  Pulmonary:     Effort: Pulmonary effort is normal.     Breath sounds: Normal breath sounds.  Abdominal:     General: Abdomen is flat.  Musculoskeletal:        General: Normal range of motion.  Skin:    General: Skin is warm and dry.  Neurological:     General: No focal deficit present.     Mental Status: She is alert.   Review of Systems  Constitutional:  Negative for chills and fever.  HENT:  Negative for hearing loss.   Eyes:  Negative for blurred vision.  Respiratory:  Negative for cough and wheezing.   Cardiovascular:  Negative for chest pain.  Gastrointestinal:  Negative for abdominal pain.  Neurological:  Negative for dizziness.  Psychiatric/Behavioral:  Positive for depression. Negative for suicidal ideas.   Blood pressure 132/73, pulse 83, temperature 97.8 F (36.6 C), temperature source Oral, resp. rate 18, height 5' 6.5" (1.689 m), weight 96.8 kg, last menstrual period 01/29/2020, SpO2 98 %. Body mass index is 33.94 kg/m.  Treatment Plan Summary: Medication management PTSD MDD Anxiety Patient has remained compliant with her medications and made some progress in her treatment and is clear to return home, per medical team. Patient will need OP therapy to address her severe PTSD but will need to continue Lexapro as she appears to respond well to the medication. However for patient to improve overall in terms of her psychiatric health, patient will have to actively engage in therapy as well as take her medication.  - Continue Lexapro 10mg  OP, would recommend increase to 20mg  OP - OP PTSD therapy at the [facility]  Psychiatry will sign off. Please do not hesitate to call back if questions arise. Thank you for this consult.    Disposition: No evidence of imminent risk to self or others at present.   Per primary team.  PGY-1 ,  MD 08/05/2020 10:48 AM

## 2020-08-05 NOTE — Progress Notes (Signed)
PT Cancellation Note  Patient Details Name: Leah Shea MRN: [id number] DOB: 16-Oct-1985   Cancelled Treatment:    Reason Eval/Treat Not Completed: Patient at procedure or test/unavailable.  Pt getting PICC. From PT standpoint pt can return home when medically ready.   Angelina Ok [facility] 08/05/2020, 11:51 AM Skip Mayer PT Acute Rehabilitation Services Pager [phone] Office [phone]

## 2020-08-05 NOTE — Progress Notes (Signed)
Nutrition f/up  Pt reports tolerating solid food X 2 meals. Feeding tube removed. PICC line placed for home IVF Pt with brighter affect today Vitamin D supplementation started, 6/16 25(OH)D level 10.6 ng/ml  Filed Weights   07/30/20 1029 07/31/20 0432 08/01/20 0600  Weight: 95.8 kg 94.7 kg 96.8 kg    Positive weight trend   Elisabeth Cara M.Odis Luster LDN Neonatal Nutrition Support Specialist/RD III

## 2020-08-05 NOTE — Care Management (Signed)
CM spoke to Gae Bon with the [facility] with the [place] # [phone] ext [phone] and she gave CM contact for Luther Parody CSW for patient # [phone] ext [phone]. CM called Jasmine and left voicemail for return call.   Gretchen Short RNC-MNN, BSN Transitions of Care Pediatrics/[facility]

## 2020-08-06 ENCOUNTER — Other Ambulatory Visit (HOSPITAL_COMMUNITY): Payer: Self-pay

## 2020-08-06 DIAGNOSIS — F419 Anxiety disorder, unspecified: Secondary | ICD-10-CM

## 2020-08-06 DIAGNOSIS — Z98891 History of uterine scar from previous surgery: Secondary | ICD-10-CM

## 2020-08-06 DIAGNOSIS — F32A Depression, unspecified: Secondary | ICD-10-CM

## 2020-08-06 DIAGNOSIS — O10919 Unspecified pre-existing hypertension complicating pregnancy, unspecified trimester: Secondary | ICD-10-CM

## 2020-08-06 MED ORDER — HYDROXYZINE PAMOATE 25 MG PO CAPS
50.0000 mg | ORAL_CAPSULE | Freq: Three times a day (TID) | ORAL | 2 refills | Status: DC | PRN
  Filled 2020-08-06: qty 60, 10d supply, fill #0

## 2020-08-06 MED ORDER — PANTOPRAZOLE SODIUM 40 MG PO TBEC
40.0000 mg | DELAYED_RELEASE_TABLET | Freq: Every day | ORAL | 6 refills | Status: DC
  Filled 2020-08-06: qty 30, 30d supply, fill #0

## 2020-08-06 MED ORDER — ONDANSETRON 8 MG PO TBDP
8.0000 mg | ORAL_TABLET | Freq: Three times a day (TID) | ORAL | 2 refills | Status: DC | PRN
  Filled 2020-08-06: qty 30, 10d supply, fill #0

## 2020-08-06 MED ORDER — LEVETIRACETAM 500 MG PO TABS
500.0000 mg | ORAL_TABLET | Freq: Two times a day (BID) | ORAL | 2 refills | Status: DC
  Filled 2020-08-06: qty 60, 30d supply, fill #0

## 2020-08-06 MED ORDER — VITAMIN D (ERGOCALCIFEROL) 1.25 MG (50000 UNIT) PO CAPS
50000.0000 [IU] | ORAL_CAPSULE | ORAL | 2 refills | Status: DC
  Filled 2020-08-06: qty 5, 35d supply, fill #0

## 2020-08-06 MED ORDER — SCOPOLAMINE 1 MG/3DAYS TD PT72
1.0000 | MEDICATED_PATCH | TRANSDERMAL | 12 refills | Status: DC
  Filled 2020-08-06: qty 10, 30d supply, fill #0

## 2020-08-06 MED ORDER — ESCITALOPRAM OXALATE 10 MG PO TABS
10.0000 mg | ORAL_TABLET | Freq: Every day | ORAL | 3 refills | Status: DC
  Filled 2020-08-06: qty 30, 30d supply, fill #0

## 2020-08-06 MED ORDER — PROMETHAZINE HCL 25 MG PO TABS
25.0000 mg | ORAL_TABLET | Freq: Four times a day (QID) | ORAL | 0 refills | Status: DC | PRN
  Filled 2020-08-06: qty 30, 8d supply, fill #0

## 2020-08-06 NOTE — Plan of Care (Signed)
  Problem: Education: Goal: Knowledge of General Education information will improve Description: Including pain rating scale, medication(s)/side effects and non-pharmacologic comfort measures Outcome: Adequate for Discharge   Problem: Health Behavior/Discharge Planning: Goal: Ability to manage health-related needs will improve Outcome: Adequate for Discharge   Problem: Clinical Measurements: Goal: Ability to maintain clinical measurements within normal limits will improve Outcome: Adequate for Discharge Goal: Will remain free from infection Outcome: Adequate for Discharge Goal: Diagnostic test results will improve Outcome: Adequate for Discharge Goal: Respiratory complications will improve Outcome: Adequate for Discharge Goal: Cardiovascular complication will be avoided Outcome: Adequate for Discharge   Problem: Clinical Measurements: Goal: Will remain free from infection Outcome: Adequate for Discharge   Problem: Clinical Measurements: Goal: Diagnostic test results will improve Outcome: Adequate for Discharge   Problem: Clinical Measurements: Goal: Respiratory complications will improve Outcome: Adequate for Discharge   Problem: Clinical Measurements: Goal: Cardiovascular complication will be avoided Outcome: Adequate for Discharge   

## 2020-08-06 NOTE — Care Management Note (Signed)
Case Management Note  Patient Details  Name: Leah Shea MRN: [id number] Date of Birth: 07-06-1985  Subjective/Objective:                  Leah Shea is a 35 y.o. [id number] at [redacted]w[redacted]d who is admitted for nausea and vomiting, dehydration and report of seizures.  Also has extensive psychiatric history.  Action/Plan: Home Health IV fluids /antiemetics  Expected Discharge Date:  08/06/20           Expected Discharge Plan:  Home w Home Health Services  In-House Referral:  Nutrition, Clinical Social Work  Discharge planning Services  CM Consult  Post Acute Care Choice:  [facility] Choice offered to:  Patient  DME Arranged:  Walker rolling, Shower stool DME Agency:  [facility]  HH Arranged:  IV infusion daily - fluids/antiemetics/PICC. HH Agency:  [facility]   Additional Comments: CM spoke to Leah Shea this am with the [place] phone # [phone] ext [phone] and she notified CM at 1000 that patient was approved for home health iv fluids/antiemetics. Plan is for patient to receive additional teaching today with husband at noon with [facility] and patient received initial teaching yesterday in patient's room by Leah Shea.  Patient will receive all supplies from [facility] ph# [name] and they have contracted nursing with [facility] and they will start RN care in the home July 5th Tuesday 2022.  Patient will return back to MAU at the [facility] facility ( Dr. Amedeo Gory has arranged) for PICC line dressing change next week on June 29th Wednesday because  [facility] unable to start until the week of July 5th due to staffing. MD has sent message to [facility] clinician for f/u for patient also. Patient will received prescriptions from the [facility] pharmacy prior to discharge.  Leah Shea RNC-MNN, BSN Transitions of Care Pediatrics/[facility]    . 08/06/2020, 10:03 AM

## 2020-08-06 NOTE — Progress Notes (Signed)
Patient ID: Leah Shea, female   DOB: Oct 01, 1985, 35 y.o.   MRN: [id number] FACULTY PRACTICE ANTEPARTUM(COMPREHENSIVE) NOTE  Leah Shea is a 35 y.o. [id number] at [redacted]w[redacted]d who is admitted for nausea and vomiting, dehydration and report of seizures.  Also has extensive psychiatric history.  Fetal presentation is unsure.  Length of Stay:  12  Days  Date of admission:07/25/2020  Subjective: Tolerated regular diet and oral medications for last two days. Feeding tube removed yesterday. Patient reports the fetal movement as active. Patient reports uterine contraction  activity as irregular. Patient reports  vaginal bleeding as none. Patient describes fluid per vagina as none.  Vitals:  Blood pressure (!) 123/59, pulse 84, temperature 98 F (36.7 C), temperature source Oral, resp. rate 18, height 5' 6.5" (1.689 m), weight 96.8 kg, last menstrual period 01/29/2020, SpO2 97 %. Vitals:   08/05/20 1937 08/05/20 2337 08/06/20 0429 08/06/20 0745  BP: 122/66 114/66 118/62 (!) 123/59  Pulse: 84 80 86 84  Resp: 18 18 18 18   Temp: 98.4 F (36.9 C) 98 F (36.7 C) 98 F (36.7 C) 98 F (36.7 C)  TempSrc: Oral Oral Oral Oral  SpO2: 97% 99% 98% 97%  Weight:      Height:       Physical Examination: General: somnolent but appropriately responsive, no distress at time of exam Abdomen: soft, nontender, gravid Fundal Height:  size equals dates Cervical Exam: Not evaluated. . Extremities: extremities normal, atraumatic, no cyanosis or edema   Fetal Monitoring:  FHR 150s, 10 x 10 accels, no decels appropriate for gestational age  Labs:  No results found for this or any previous visit (from the past 24 hour(s)).  Imaging Studies:    EKG SITE RITE  Result Date: 08/05/2020 If [facility] image not attached, placement could not be confirmed due to current cardiac rhythm.    Medications:  Scheduled  Chlorhexidine Gluconate Cloth  6 each Topical Daily   enoxaparin (LOVENOX) injection  50  mg Subcutaneous Q24H   escitalopram  10 mg Oral Daily   famotidine  20 mg Oral Q12H   hydrOXYzine  25 mg Intramuscular Once   levETIRAcetam  500 mg Oral BID   pyridOXINE  100 mg Intravenous Daily   scopolamine  1 patch Transdermal Q72H   sodium chloride flush  10-40 mL Intracatheter Q12H   Vitamin D (Ergocalciferol)  50,000 Units Oral Q7 days   I have reviewed the patient's current medications.  ASSESSMENT: [facility] Estimated Date of Delivery: 11/04/20   Principal Problem:   Nausea and vomiting during pregnancy Active Problems:   History of 3 cesarean sections   Seizure disorder during pregnancy in second trimester (HCC)   Chronic hypertension affecting pregnancy   Chronic migraine without aura, intractable, without status migrainosus   Posttraumatic stress disorder   BV (bacterial vaginosis)   Low vitamin D level   Anxiety and depression   [redacted] weeks gestation of pregnancy  PLAN:  Will remove feeding tube today and continue regular diet. Appreciate Nutrition Specialist recommendations.  Continue anti-nausea medications for now. Will need IV fluid and antiemetic infusion at home, PICC line placed yesterday.  Appreciate Neurology evaluation and input, will consult as needed. Negative evaluation thus far. Continue Keppra and other medications as recommended  Appreciate Psychiatry recommendations; given history of anxiety, depression, PTSD and there is great concern about psychosomatic symptoms. Lexapro initiated yesterday, patient will arrange for outpatient follow up with psychotherapist at the [facility].  Appreciate Physical Therapy input  given patient's mobility issues. Patient is still ambulating with assistance of walker, already has mobility aids to take home with her.  Of note, there were no ambulation issues prior to admission.   Reassuring FHR tracing for gestational age. Stable BP.  No acute OB issues.  Continue routine antenatal care, will hope to discharge her soon. Appreciate  Care Management help in arranging for anything patient needs at home.  Still awaiting authorization from her insurance company. Hopeful for discharge soon.   Jaynie Collins, MD

## 2020-08-06 NOTE — Progress Notes (Signed)
Pt d/c with significant other  amb out with walker

## 2020-08-06 NOTE — Care Management (Signed)
CM [facility] [place] [facility], still awaiting auth for patient to be discharged with IV home health. MD updated.  Gretchen Short RNC-MNN, BSN Transitions of Care Pediatrics/[facility]

## 2020-08-09 ENCOUNTER — Inpatient Hospital Stay (HOSPITAL_COMMUNITY)
Admission: AD | Admit: 2020-08-09 | Discharge: 2020-08-09 | Disposition: A | Discharge status: Home / Self Care | Payer: No Typology Code available for payment source | Source: Ambulatory Visit | Attending: Obstetrics & Gynecology | Admitting: Obstetrics & Gynecology

## 2020-08-09 ENCOUNTER — Other Ambulatory Visit: Payer: Self-pay

## 2020-08-09 ENCOUNTER — Encounter (HOSPITAL_COMMUNITY): Payer: Self-pay | Admitting: Obstetrics & Gynecology

## 2020-08-09 DIAGNOSIS — Z3A28 28 weeks gestation of pregnancy: Secondary | ICD-10-CM | POA: Diagnosis not present

## 2020-08-09 DIAGNOSIS — O210 Mild hyperemesis gravidarum: Secondary | ICD-10-CM | POA: Insufficient documentation

## 2020-08-09 DIAGNOSIS — Z452 Encounter for adjustment and management of vascular access device: Secondary | ICD-10-CM

## 2020-08-09 DIAGNOSIS — O10919 Unspecified pre-existing hypertension complicating pregnancy, unspecified trimester: Secondary | ICD-10-CM

## 2020-08-09 DIAGNOSIS — Z3A27 27 weeks gestation of pregnancy: Secondary | ICD-10-CM

## 2020-08-09 DIAGNOSIS — T82898A Other specified complication of vascular prosthetic devices, implants and grafts, initial encounter: Secondary | ICD-10-CM

## 2020-08-09 DIAGNOSIS — O0990 Supervision of high risk pregnancy, unspecified, unspecified trimester: Secondary | ICD-10-CM

## 2020-08-09 NOTE — MAU Provider Note (Signed)
Chief Complaint: PICC line    None     SUBJECTIVE HPI: Leah Shea is a 35 y.o. [id number] who presents to maternity admissions for problems with her picc line. She has a history of hyperemesis and had a picc like placed last week while in the hospital.  She was dc'd home on 6/23. She was not having any trouble flushing the PICC line, however her infusion would not infuse. No other concerns.   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   Transferred from DOD/VA problem list   PTSD (post-traumatic stress disorder)    Shortness of breath    with exertion or panic attack   Sickle cell trait ([place])    Sleep apnea    awaiting a sleep study to be done at [facility]   Past Surgical History:  Procedure Laterality Date   BREAST SURGERY Bilateral    reductions   CESAREAN SECTION  2010, 2012,2014   x 3   EYE SURGERY     x 10 as a child   PILONIDAL CYST EXCISION  2020   TONSILLECTOMY N/A 10/07/2013   Procedure: TONSILLECTOMY;  Surgeon: Melida Quitter, MD;  Location: [facility] OR;  Service: ENT;  Laterality: N/A;   TUMOR REMOVAL     tumor removed from back   Social History   Socioeconomic History   Marital status: Married    Spouse name: Not on file   Number of children: Not on file   Years of education: Not on file   Highest education level: Not on file  Occupational History   Not on file  Tobacco Use   Smoking status: Former    Packs/day: 0.25    Years: 1.00    Pack years: 0.25    Types: Cigarettes    Quit date: 07/02/2013    Years since quitting: 7.1   Smokeless tobacco: Never  Vaping Use   Vaping Use: Never used  Substance and Sexual Activity   Alcohol use: Not Currently   Drug use: Not Currently    Types:  Marijuana    Comment: last used January 3   Sexual activity: Yes    Partners: Male    Birth control/protection: None  Other Topics Concern   Not on file  Social History Narrative   Not on file   Social Determinants of Health   Financial Resource Strain: Not on file  Food Insecurity: Not on file  Transportation Needs: Not on file  Physical Activity: Not on file  Stress: Not on file  Social Connections: Not on file  Intimate Partner Violence: Not on file   No current facility-administered medications on file prior to encounter.   Current Outpatient Medications on File Prior to Encounter  Medication Sig Dispense Refill   acetaminophen (TYLENOL) 500 MG tablet Take 2 tablets (1,000 mg total) by mouth every 8 (eight) hours as needed for moderate pain or fever. 30 tablet 0   busPIRone (BUSPAR) 15 MG tablet Take by mouth.     diphenhydrAMINE (BENADRYL) 50 MG tablet Take 0.5 tablets (25 mg total) by mouth at bedtime as needed for sleep. 30 tablet 0   Doxylamine-Pyridoxine (DICLEGIS) 10-10 MG TBEC  Take 2 tablets by mouth at bedtime. If symptoms persist, add one tablet in the morning and one in the afternoon 100 tablet 5   escitalopram (LEXAPRO) 10 MG tablet Take 1 tablet (10 mg total) by mouth daily. 30 tablet 3   folic acid (FOLVITE) 1 MG tablet Take 1 tablet (1 mg total) by mouth daily. 30 tablet 10   hydrOXYzine (VISTARIL) 25 MG capsule Take 2 capsules (50 mg total) by mouth 3 (three) times daily as needed for anxiety. 60 capsule 2   levETIRAcetam (KEPPRA) 500 MG tablet Take 1 tablet (500 mg total) by mouth every 12 (twelve) hours. 112 tablet 0   ondansetron (ZOFRAN ODT) 8 MG disintegrating tablet Take 1 tablet (8 mg total) by mouth every 8 (eight) hours as needed for nausea or vomiting. 30 tablet 2   pantoprazole (PROTONIX) 40 MG tablet Take 1 tablet (40 mg total) by mouth daily. 30 tablet 6   promethazine (PHENERGAN) 25 MG tablet Take 1 tablet (25 mg total) by mouth every 6 (six) hours  as needed for nausea or vomiting. 30 tablet 0   [START ON 08/11/2020] Vitamin D, Ergocalciferol, (DRISDOL) 1.25 MG (50000 UNIT) CAPS capsule Take 1 capsule (50,000 Units total) by mouth every 7 (seven) days. 5 capsule 2   Blood Pressure Monitoring (BLOOD PRESSURE KIT) DEVI 1 kit by Does not apply route once a week. 1 each 0   escitalopram (LEXAPRO) 10 MG tablet Take by mouth.     levETIRAcetam (KEPPRA) 500 MG tablet Take 1 tablet (500 mg total) by mouth 2 (two) times daily. 60 tablet 2   scopolamine (TRANSDERM-SCOP) 1 MG/3DAYS Place 1 patch (1.5 mg total) onto the skin every 3 (three) days. 10 patch 12   scopolamine (TRANSDERM-SCOP) 1 MG/3DAYS Place 1 patch  onto the skin every 3 (three) days. 10 patch 12   Allergies  Allergen Reactions   Other Anaphylaxis and Hives    Mushrooms   Nsaids Hives, Swelling and Other (See Comments)    Body burns    Reglan [Metoclopramide] Other (See Comments)    anxiety   Sulfa Antibiotics Other (See Comments)    burns    ROS:  Review of Systems  All other systems reviewed and are negative.  I have reviewed patient's Past Medical Hx, Surgical Hx, Family Hx, Social Hx, medications and allergies.   Physical Exam  Patient Vitals for the past 24 hrs:  BP Temp Temp src Pulse Resp SpO2 Height Weight  08/09/20 1855 124/83 98.3 F (36.8 C) Oral 80 16 99 % -- --  08/09/20 1850 -- -- -- -- -- -- 5' 6.5" (1.689 m) 93.6 kg   Physical Exam Constitutional:      General: She is not in acute distress.    Appearance: Normal appearance. She is not ill-appearing, toxic-appearing or diaphoretic.  Neurological:     Mental Status: She is alert.  Psychiatric:        Behavior: Behavior normal.    MDM  PICC team consult placed    ASSESSMENT MSE Complete  PLAN  Awaiting IV team at this time.   Lezlie Lye, NP 08/09/2020 7:50 PM

## 2020-08-09 NOTE — MAU Note (Signed)
Leah Shea is a 35 y.o. at [redacted]w[redacted]d here in MAU reporting: today while doing her infusion at home the infusion stopped flowing and wasn't working. States she was able to flush it without resistance. Having some more pain in that area.   Onset of complaint: today  Pain score: 5/10  Vitals:   08/09/20 1855  BP: 124/83  Pulse: 80  Resp: 16  Temp: 98.3 F (36.8 C)  SpO2: 99%     FHT:156  Lab orders placed from triage: none

## 2020-08-09 NOTE — MAU Note (Signed)
IV team in for evaluation of PICC line

## 2020-08-09 NOTE — MAU Provider Note (Signed)
PICC line team reported to RN that PICC line was functional and dressing had been changed, and patient had been instructed to check on function of pump if no success with infusions at home. Pt discharged to home in stable condition.  Marylen Ponto, NP  8:47 PM 08/09/2020

## 2020-08-11 ENCOUNTER — Encounter: Payer: No Typology Code available for payment source | Admitting: Licensed Clinical Social Worker

## 2020-08-13 ENCOUNTER — Other Ambulatory Visit: Payer: Self-pay

## 2020-08-13 ENCOUNTER — Inpatient Hospital Stay (HOSPITAL_COMMUNITY)
Admission: AD | Admit: 2020-08-13 | Discharge: 2020-08-13 | Disposition: A | Discharge status: Home / Self Care | Payer: No Typology Code available for payment source | Attending: Obstetrics & Gynecology | Admitting: Obstetrics & Gynecology

## 2020-08-13 ENCOUNTER — Encounter: Payer: No Typology Code available for payment source | Admitting: Obstetrics and Gynecology

## 2020-08-13 DIAGNOSIS — O99891 Other specified diseases and conditions complicating pregnancy: Secondary | ICD-10-CM

## 2020-08-13 DIAGNOSIS — X58XXXA Exposure to other specified factors, initial encounter: Secondary | ICD-10-CM | POA: Diagnosis not present

## 2020-08-13 DIAGNOSIS — D573 Sickle-cell trait: Secondary | ICD-10-CM | POA: Insufficient documentation

## 2020-08-13 DIAGNOSIS — O10912 Unspecified pre-existing hypertension complicating pregnancy, second trimester: Secondary | ICD-10-CM

## 2020-08-13 DIAGNOSIS — T82898A Other specified complication of vascular prosthetic devices, implants and grafts, initial encounter: Secondary | ICD-10-CM | POA: Insufficient documentation

## 2020-08-13 DIAGNOSIS — O10913 Unspecified pre-existing hypertension complicating pregnancy, third trimester: Secondary | ICD-10-CM | POA: Insufficient documentation

## 2020-08-13 DIAGNOSIS — Z87891 Personal history of nicotine dependence: Secondary | ICD-10-CM | POA: Insufficient documentation

## 2020-08-13 DIAGNOSIS — Z79899 Other long term (current) drug therapy: Secondary | ICD-10-CM | POA: Diagnosis not present

## 2020-08-13 DIAGNOSIS — Z3A27 27 weeks gestation of pregnancy: Secondary | ICD-10-CM | POA: Diagnosis not present

## 2020-08-13 DIAGNOSIS — O10919 Unspecified pre-existing hypertension complicating pregnancy, unspecified trimester: Secondary | ICD-10-CM

## 2020-08-13 DIAGNOSIS — O2693 Pregnancy related conditions, unspecified, third trimester: Secondary | ICD-10-CM | POA: Diagnosis not present

## 2020-08-13 DIAGNOSIS — F32A Depression, unspecified: Secondary | ICD-10-CM | POA: Diagnosis not present

## 2020-08-13 DIAGNOSIS — O0990 Supervision of high risk pregnancy, unspecified, unspecified trimester: Secondary | ICD-10-CM

## 2020-08-13 DIAGNOSIS — T82898S Other specified complication of vascular prosthetic devices, implants and grafts, sequela: Secondary | ICD-10-CM | POA: Diagnosis not present

## 2020-08-13 DIAGNOSIS — O99343 Other mental disorders complicating pregnancy, third trimester: Secondary | ICD-10-CM | POA: Insufficient documentation

## 2020-08-13 DIAGNOSIS — O99113 Other diseases of the blood and blood-forming organs and certain disorders involving the immune mechanism complicating pregnancy, third trimester: Secondary | ICD-10-CM | POA: Diagnosis not present

## 2020-08-13 DIAGNOSIS — Z3A28 28 weeks gestation of pregnancy: Secondary | ICD-10-CM | POA: Insufficient documentation

## 2020-08-13 DIAGNOSIS — Z452 Encounter for adjustment and management of vascular access device: Secondary | ICD-10-CM | POA: Diagnosis present

## 2020-08-13 MED ORDER — HEPARIN SOD (PORK) LOCK FLUSH 100 UNIT/ML IV SOLN
250.0000 [IU] | INTRAVENOUS | Status: AC | PRN
  Administered 2020-08-13: 250 [IU]
  Filled 2020-08-13: qty 2.5

## 2020-08-13 NOTE — MAU Note (Signed)
Presents stating PICC line "clogged", reports unable to flush since last night.  States has pain at insertion site when attempting flush.

## 2020-08-13 NOTE — Progress Notes (Signed)
IV team into see pt and eval PICC line.

## 2020-08-13 NOTE — MAU Provider Note (Signed)
History     [id number]  Arrival date and time: 08/13/20 0941    Chief Complaint  Patient presents with   PICC Line Evaluation     HPI Leah Shea is a 35 y.o. at 21w1dby 9wk [place] PM[place] notable for HG, migraines, 3 prior cesareans, possible seizure disorder, and recent 2 week admission for multiple issues, who presents for PICC line issue.   Review of discharge summary from last admission on 07/25/20 - 6/23: admitted for n/v and possible seizure activity. Extensive workup done by neuro, psych, PT. Briefly had a NG tube, discharged with PICC line for fluids/emetics.  Today reports that her PICC line clogged during an infusion yesterday It has also been mildly painful Not swollen No other complaints Rare intermittent contractions No bleeding or loss of fluid Good fetal movement  --/--/A POS (06/11 1706)  OB History     Gravida  7   Para  4   Term  2   Preterm  2   AB  2   Living  4      SAB  2   IAB      Ectopic      Multiple      Live Births  4           Past Medical History:  Diagnosis Date   Anemia    Anxiety    Asthma    Bronchitis, acute    Chronic hypertension affecting pregnancy 04/30/2020   Complex partial seizures ([place] 2020   Depression    Diverticular disease    G6PD deficiency    Headache(784.0)    migraines   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   Transferred from DOD/VA problem list   PTSD (post-traumatic stress disorder)    Shortness of breath    with exertion or panic attack   [place]ickle cell trait ([place]    Sleep apnea    [facility]   Past Surgical History:  Procedure Laterality Date   BREAST SURGERY Bilateral    reductions   CESAREAN SECTION  2010, 2012,2014   x 3   EYE SURGERY     x 10 as a child   PILONIDAL CYST EXCISION  2020   TONSILLECTOMY N/A 10/07/2013   Procedure: TONSILLECTOMY;  Surgeon: DMelida Quitter MD;  [facility]  Service: E[facility];  Laterality: N/A;   TUMOR  REMOVAL     tumor removed from back    Family History  Problem Relation Age of Onset   Breast cancer Mother    Breast cancer Maternal Aunt 36       metastatic   Cancer Maternal Uncle        unk type   Breast cancer Maternal Grandmother    Prostate cancer Maternal Grandfather        metastatic   Cancer Maternal Aunt        either breast or ovarian     Social History   Socioeconomic History   Marital status: Married    Spouse name: Not on file   Number of children: Not on file   Years of education: Not on file   Highest education level: Not on file  Occupational History   Not on file  Tobacco Use   Smoking status: Former    Packs/day: 0.25    Years: 1.00    Pack years: 0.25    Types: Cigarettes    Quit date: 07/02/2013  Years since quitting: 7.1   Smokeless tobacco: Never  Vaping Use   Vaping Use: Never used  Substance and Sexual Activity   Alcohol use: Not Currently   Drug use: Not Currently    Types: Marijuana    Comment: last used January 3   Sexual activity: Yes    Partners: Male    Birth control/protection: None  Other Topics Concern   Not on file  Social History Narrative   Not on file   Social Determinants of Health   Financial Resource Strain: Not on file  Food Insecurity: Not on file  Transportation Needs: Not on file  Physical Activity: Not on file  Stress: Not on file  Social Connections: Not on file  Intimate Partner Violence: Not on file    Allergies  Allergen Reactions   Other Anaphylaxis and Hives    Mushrooms   Nsaids Hives, Swelling and Other (See Comments)    Body burns    Reglan [Metoclopramide] Other (See Comments)    anxiety   Sulfa Antibiotics Other (See Comments)    burns    No current facility-administered medications on file prior to encounter.   Current Outpatient Medications on File Prior to Encounter  Medication Sig Dispense Refill   acetaminophen (TYLENOL) 500 MG tablet Take 2 tablets (1,000 mg total) by mouth  every 8 (eight) hours as needed for moderate pain or fever. 30 tablet 0   Blood Pressure Monitoring (BLOOD PRESSURE KIT) DEVI 1 kit by Does not apply route once a week. 1 each 0   busPIRone (BUSPAR) 15 MG tablet Take by mouth.     diphenhydrAMINE (BENADRYL) 50 MG tablet Take 0.5 tablets (25 mg total) by mouth at bedtime as needed for sleep. 30 tablet 0   Doxylamine-Pyridoxine (DICLEGIS) 10-10 MG TBEC Take 2 tablets by mouth at bedtime. If symptoms persist, add one tablet in the morning and one in the afternoon 100 tablet 5   escitalopram (LEXAPRO) 10 MG tablet Take by mouth.     escitalopram (LEXAPRO) 10 MG tablet Take 1 tablet (10 mg total) by mouth daily. 30 tablet 3   folic acid (FOLVITE) 1 MG tablet Take 1 tablet (1 mg total) by mouth daily. 30 tablet 10   hydrOXYzine (VISTARIL) 25 MG capsule Take 2 capsules (50 mg total) by mouth 3 (three) times daily as needed for anxiety. 60 capsule 2   levETIRAcetam (KEPPRA) 500 MG tablet Take 1 tablet (500 mg total) by mouth every 12 (twelve) hours. 112 tablet 0   levETIRAcetam (KEPPRA) 500 MG tablet Take 1 tablet (500 mg total) by mouth 2 (two) times daily. 60 tablet 2   ondansetron (ZOFRAN ODT) 8 MG disintegrating tablet Take 1 tablet (8 mg total) by mouth every 8 (eight) hours as needed for nausea or vomiting. 30 tablet 2   pantoprazole (PROTONIX) 40 MG tablet Take 1 tablet (40 mg total) by mouth daily. 30 tablet 6   promethazine (PHENERGAN) 25 MG tablet Take 1 tablet (25 mg total) by mouth every 6 (six) hours as needed for nausea or vomiting. 30 tablet 0   scopolamine (TRANSDERM-SCOP) 1 MG/3DAYS Place 1 patch (1.5 mg total) onto the skin every 3 (three) days. 10 patch 12   scopolamine (TRANSDERM-SCOP) 1 MG/3DAYS Place 1 patch  onto the skin every 3 (three) days. 10 patch 12   Vitamin D, Ergocalciferol, (DRISDOL) 1.25 MG (50000 UNIT) CAPS capsule Take 1 capsule (50,000 Units total) by mouth every 7 (seven) days. 5 capsule 2  ROS Pertinent  positives and negative per HPI, all others reviewed and negative  Physical Exam   BP 121/80 (BP Location: Left Arm)   Pulse 87   Temp 98.4 F (36.9 C) (Oral)   Resp 20   Ht 5' 6.5" (1.689 m)   Wt 94.2 kg   LMP 01/29/2020   SpO2 100%   BMI 33.01 kg/m   Patient Vitals for the past 24 hrs:  BP Temp Temp src Pulse Resp SpO2 Height Weight  08/13/20 1000 121/80 98.4 F (36.9 C) Oral 87 20 100 % -- --  08/13/20 0954 -- -- -- -- -- -- 5' 6.5" (1.689 m) 94.2 kg    Physical Exam Vitals reviewed.  Constitutional:      General: She is not in acute distress.    Appearance: She is well-developed. She is not diaphoretic.  Eyes:     General: No scleral icterus. Pulmonary:     Effort: Pulmonary effort is normal. No respiratory distress.  Skin:    General: Skin is warm and dry.  Neurological:     Mental Status: She is alert.     Coordination: Coordination normal.     Cervical Exam    Bedside Ultrasound Not done  My interpretation: n/a  FHT FHR 154 bpm  Labs No results found for this or any previous visit (from the past 24 hour(s)).  Imaging No results found.  MAU Course  Procedures Lab Orders  No laboratory test(s) ordered today   Meds ordered this encounter  Medications   heparin lock flush 100 unit/mL   Imaging Orders  No imaging studies ordered today    MDM moderate  Assessment and Plan  #PICC line malfunction No other issues or complaints. IV team consulted, line heparinized and cleared. Discharged to home in stable condition, already has home health services set up.   #FWB Normal doppler    Clarnce Flock, MD/MPH 08/13/20 12:43 PM  Allergies as of 08/13/2020       Reactions   Other Anaphylaxis, Hives   Mushrooms   Nsaids Hives, Swelling, Other (See Comments)   Body burns   Reglan [metoclopramide] Other (See Comments)   anxiety   Sulfa Antibiotics Other (See Comments)   burns        Medication List     TAKE these medications     acetaminophen 500 MG tablet Commonly known as: TYLENOL Take 2 tablets (1,000 mg total) by mouth every 8 (eight) hours as needed for moderate pain or fever.   Blood Pressure Kit Devi 1 kit by Does not apply route once a week.   busPIRone 15 MG tablet Commonly known as: BUSPAR Take by mouth.   diphenhydrAMINE 50 MG tablet Commonly known as: BENADRYL Take 0.5 tablets (25 mg total) by mouth at bedtime as needed for sleep.   Doxylamine-Pyridoxine 10-10 MG Tbec Commonly known as: Diclegis Take 2 tablets by mouth at bedtime. If symptoms persist, add one tablet in the morning and one in the afternoon   escitalopram 10 MG tablet Commonly known as: LEXAPRO Take by mouth.   escitalopram 10 MG tablet Commonly known as: LEXAPRO Take 1 tablet (10 mg total) by mouth daily.   folic acid 1 MG tablet Commonly known as: FOLVITE Take 1 tablet (1 mg total) by mouth daily.   hydrOXYzine 25 MG capsule Commonly known as: VISTARIL Take 2 capsules (50 mg total) by mouth 3 (three) times daily as needed for anxiety.   levETIRAcetam 500 MG tablet Commonly  known as: Keppra Take 1 tablet (500 mg total) by mouth every 12 (twelve) hours.   levETIRAcetam 500 MG tablet Commonly known as: KEPPRA Take 1 tablet (500 mg total) by mouth 2 (two) times daily.   ondansetron 8 MG disintegrating tablet Commonly known as: Zofran ODT Take 1 tablet (8 mg total) by mouth every 8 (eight) hours as needed for nausea or vomiting.   pantoprazole 40 MG tablet Commonly known as: Protonix Take 1 tablet (40 mg total) by mouth daily.   promethazine 25 MG tablet Commonly known as: PHENERGAN Take 1 tablet (25 mg total) by mouth every 6 (six) hours as needed for nausea or vomiting.   scopolamine 1 MG/3DAYS Commonly known as: TRANSDERM-SCOP Place 1 patch (1.5 mg total) onto the skin every 3 (three) days.   Transderm-Scop 1 MG/3DAYS Generic drug: scopolamine Place 1 patch  onto the skin every 3 (three) days.    Vitamin D (Ergocalciferol) 1.25 MG (50000 UNIT) Caps capsule Commonly known as: DRISDOL Take 1 capsule (50,000 Units total) by mouth every 7 (seven) days.

## 2020-08-13 NOTE — Progress Notes (Signed)
MSE completed by Dr. Crissie Reese.  IV Team to be notified regarding pt arrival and CC.

## 2020-08-18 ENCOUNTER — Ambulatory Visit: Payer: No Typology Code available for payment source | Admitting: *Deleted

## 2020-08-18 ENCOUNTER — Other Ambulatory Visit: Payer: Self-pay

## 2020-08-18 ENCOUNTER — Encounter: Payer: Self-pay | Admitting: *Deleted

## 2020-08-18 ENCOUNTER — Ambulatory Visit: Payer: No Typology Code available for payment source | Attending: Maternal & Fetal Medicine

## 2020-08-18 VITALS — BP 135/88 | HR 81

## 2020-08-18 DIAGNOSIS — O99352 Diseases of the nervous system complicating pregnancy, second trimester: Secondary | ICD-10-CM | POA: Diagnosis present

## 2020-08-18 DIAGNOSIS — G40909 Epilepsy, unspecified, not intractable, without status epilepticus: Secondary | ICD-10-CM

## 2020-08-18 DIAGNOSIS — O99353 Diseases of the nervous system complicating pregnancy, third trimester: Secondary | ICD-10-CM

## 2020-08-18 DIAGNOSIS — Z362 Encounter for other antenatal screening follow-up: Secondary | ICD-10-CM

## 2020-08-18 DIAGNOSIS — E669 Obesity, unspecified: Secondary | ICD-10-CM | POA: Diagnosis not present

## 2020-08-18 DIAGNOSIS — O09293 Supervision of pregnancy with other poor reproductive or obstetric history, third trimester: Secondary | ICD-10-CM

## 2020-08-18 DIAGNOSIS — O99213 Obesity complicating pregnancy, third trimester: Secondary | ICD-10-CM | POA: Diagnosis not present

## 2020-08-18 DIAGNOSIS — O0990 Supervision of high risk pregnancy, unspecified, unspecified trimester: Secondary | ICD-10-CM | POA: Diagnosis present

## 2020-08-18 DIAGNOSIS — O10013 Pre-existing essential hypertension complicating pregnancy, third trimester: Secondary | ICD-10-CM

## 2020-08-18 DIAGNOSIS — O34219 Maternal care for unspecified type scar from previous cesarean delivery: Secondary | ICD-10-CM

## 2020-08-18 DIAGNOSIS — Z3A27 27 weeks gestation of pregnancy: Secondary | ICD-10-CM

## 2020-08-18 DIAGNOSIS — O10919 Unspecified pre-existing hypertension complicating pregnancy, unspecified trimester: Secondary | ICD-10-CM

## 2020-08-18 DIAGNOSIS — Z3A28 28 weeks gestation of pregnancy: Secondary | ICD-10-CM

## 2020-08-18 DIAGNOSIS — Z148 Genetic carrier of other disease: Secondary | ICD-10-CM

## 2020-08-18 IMAGING — US US MFM OB FOLLOW-UP
1 series · 13 of 23 positions shown · non-contrast
Comparison: none

[Series 1: us mfm ob follow-up · 13 of 23 slices shown]
[im 1/23]
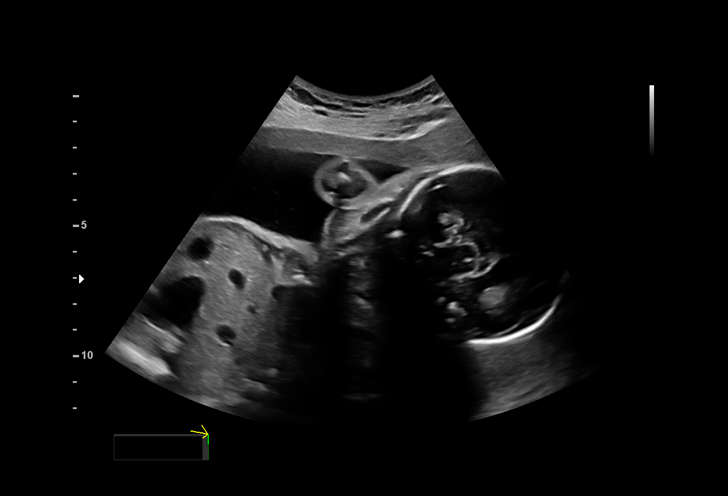
[im 3/23]
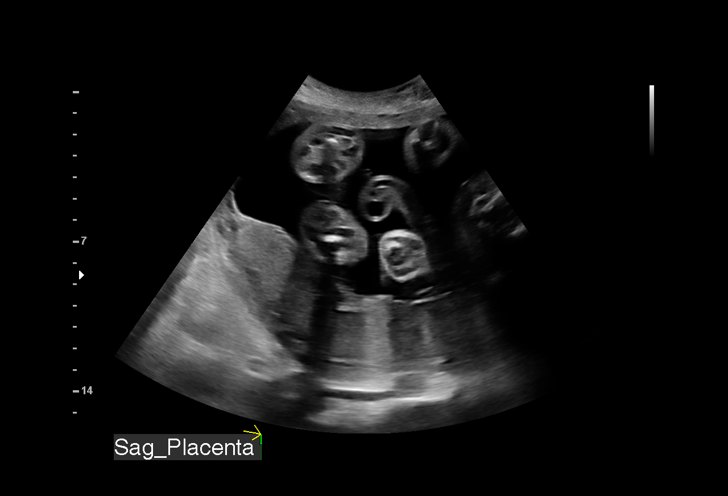
[im 5/23]
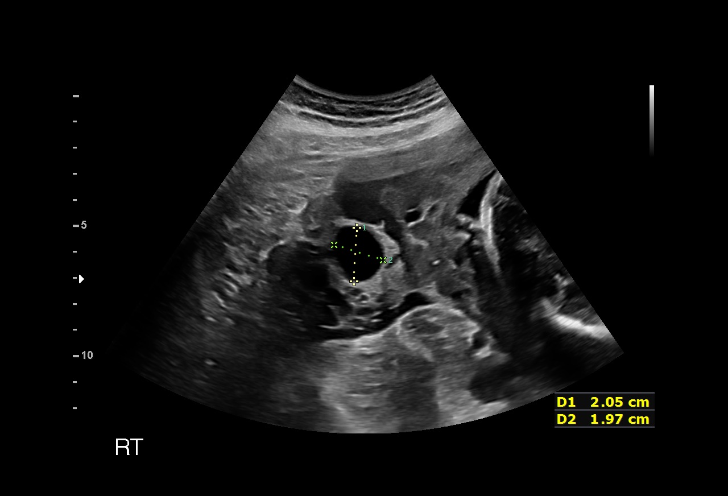
[im 7/23]
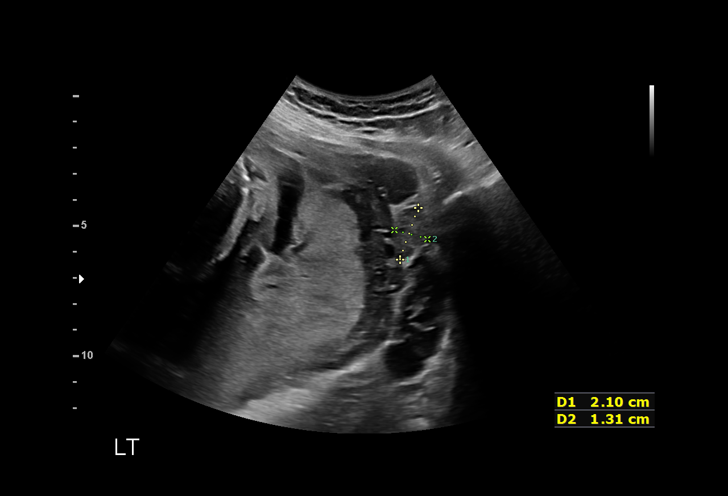
[im 8/23]
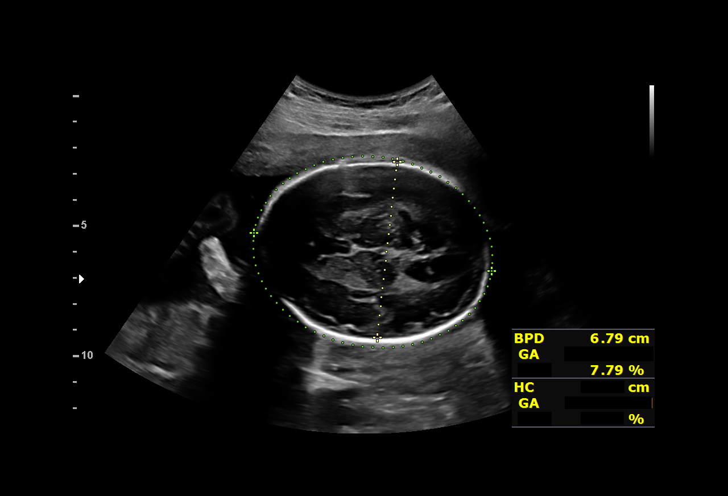
[im 10/23]
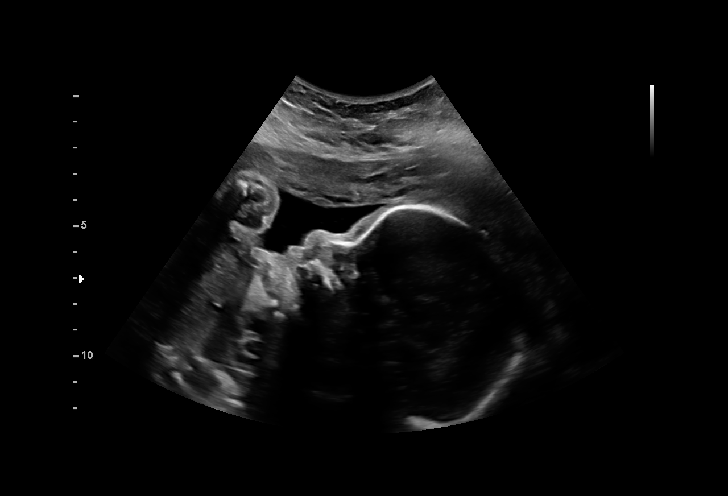
[im 12/23]
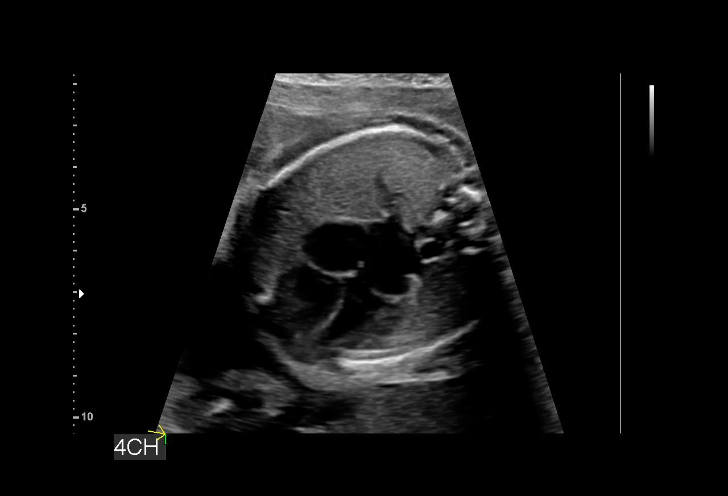
[im 14/23]
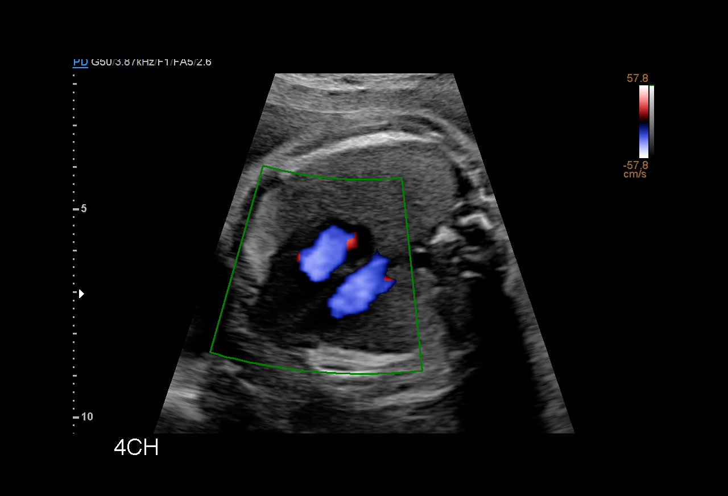
[im 16/23]
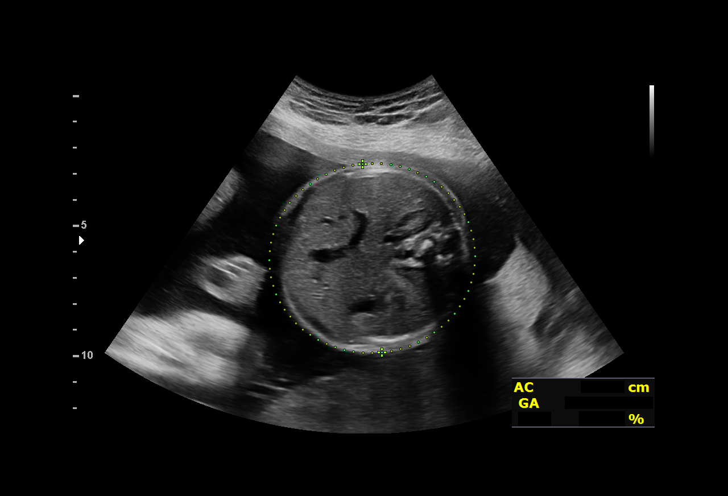
[im 17/23]
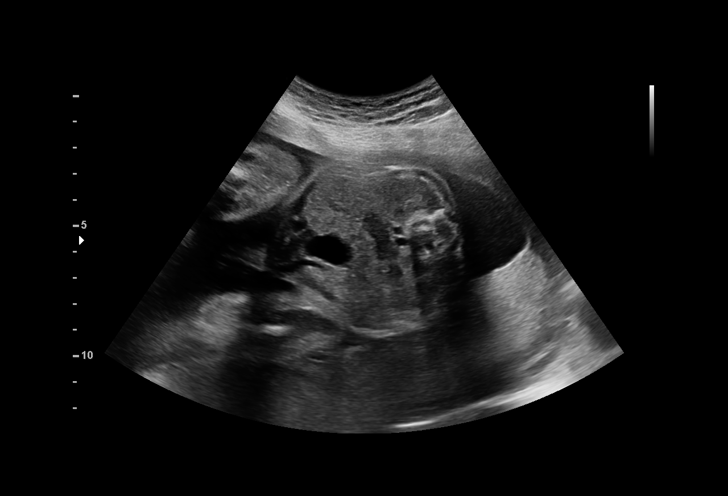
[im 19/23]
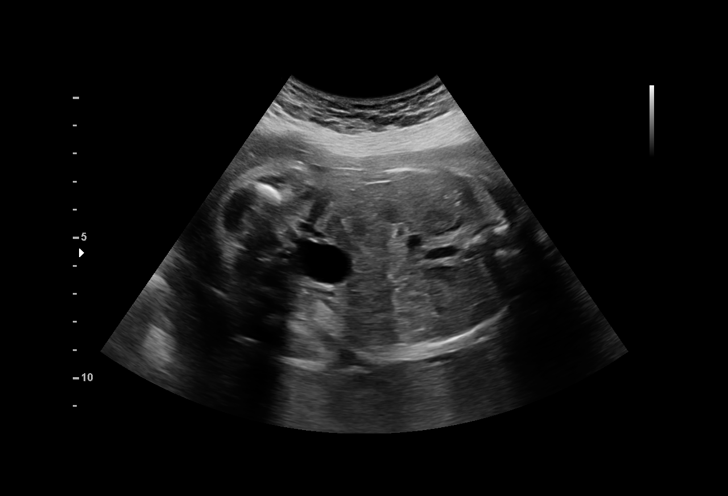
[im 21/23]
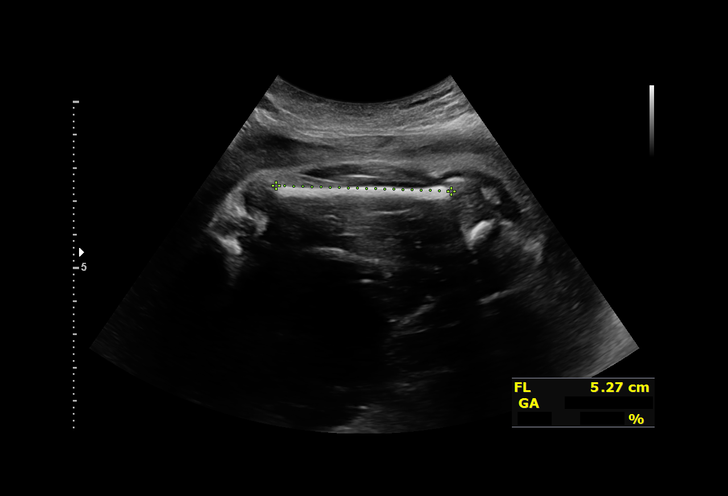
[im 23/23]
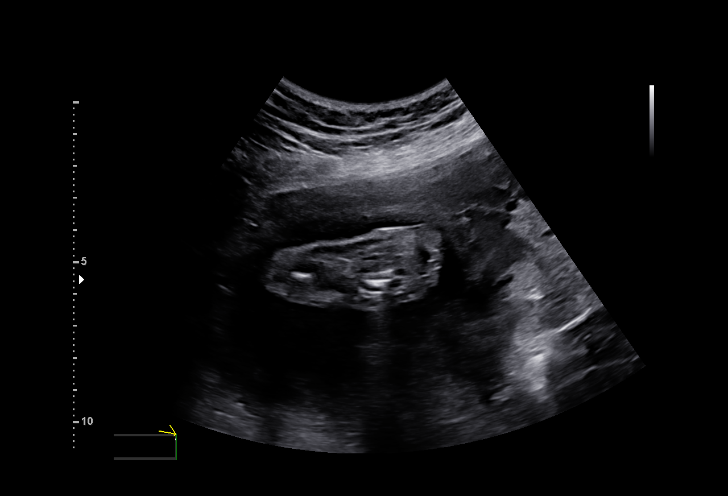

[13 of 23 positions shown; findings below may reference images not displayed]

[REDACTED]care

                                                      [place]

Indications

 Obesity complicating pregnancy, third          [GX]
 trimester (BMI 32)
 Hypertension - Chronic/Pre-existing (ASA)      [GX]
 28 weeks gestation of pregnancy
 Encounter for other antenatal screening        [GX]
 follow-up
 History of cesarean delivery, currently        [GX]
 pregnant (x3)
 Genetic carrier (Sickle Cell)                  [GX]
 Seizure disorder (Lamictal, Keppra)            [GX] [GX]
 Poor obstetric history: Previous fetal growth  [GX]
 restriction (FGR)
Fetal Evaluation

 Num Of Fetuses:         1
 Fetal Heart Rate(bpm):  140
 Cardiac Activity:       Observed
 Presentation:           Cephalic
 Placenta:               Posterior Fundal
 P. Cord Insertion:      Previously Visualized

 Amniotic Fluid
 AFI FV:      Within normal limits

 AFI Sum(cm)     %Tile       Largest Pocket(cm)
 21.15           85
 RUQ(cm)       RLQ(cm)       LUQ(cm)        LLQ(cm)

Biometry

 BPD:        68  mm     G. Age:  27w 3d          9  %    CI:        68.73   %    70 - 86
                                                         FL/HC:      20.2   %    19.6 -
 HC:      262.1  mm     G. Age:  28w 4d         17  %    HC/AC:      1.09        0.99 -
 AC:      239.6  mm     G. Age:  28w 2d         33  %    FL/BPD:     77.8   %    71 - 87
 FL:       52.9  mm     G. Age:  28w 1d         23  %    FL/AC:      22.1   %    20 - 24
 LV:        3.1  mm

 Est. FW:    [GX]  gm    2 lb 10 oz      23  %
OB History

 Gravidity:    7
 Living:       4
Gestational Age

 LMP:           28w 6d        Date:  [DATE]                 EDD:   [DATE]
 U/S Today:     28w 1d                                        EDD:   [DATE]
 Best:          28w 4d     Det. By:  U/S C R L  ([DATE])    EDD:   [DATE]
Anatomy

 Cranium:               Previously seen        Aortic Arch:            Previously seen
 Cavum:                 Previously seen        Ductal Arch:            Previously seen
 Ventricles:            Previously seen        Diaphragm:              Previously seen
 Choroid Plexus:        Previously seen        Stomach:                Previously Seen
 Cerebellum:            Previously seen        Abdomen:                Previously seen
 Posterior Fossa:       Previously seen        Abdominal Wall:         Previously seen
 Nuchal Fold:           Previously seen        Cord Vessels:           Previously seen
 Face:                  Orbits and profile     Kidneys:                Previously seen
                        previously seen
 Lips:                  Previously seen        Bladder:                Previously seen
 Thoracic:              Previously seen        Spine:                  Previously seen
 Heart:                 Previously seen        Upper Extremities:      Previously seen
 RVOT:                  Previously seen        Lower Extremities:      Previously seen
 LVOT:                  Previously seen

 Other:  Male gender previously seen. Lenses, Nasal Bone, Heels and RIGHT
         open hands/5th digits visualized previously. VC, 3VV and 3VTV
         visualized previously .
Cervix Uterus Adnexa

 Cervix
 Not visualized (advanced GA >[GX])

 Right Ovary
 Cyst- 2 cm

 Left Ovary
 Within normal limits.
 Cul De Sac
 No free fluid seen.

 Adnexa
 No abnormality visualized.
Comments

 This patient was seen for a follow up growth scan due to
 maternal obesity, chronic hypertension that is not treated with
 any medications, and hyperemesis gravidarum.  She reports
 that she had a PICC line placed for daily IV hydration and
 antiemetics about 2 weeks ago.
 She was informed that the fetal growth and amniotic fluid
 level appears appropriate for her gestational age.
 A follow up exam was scheduled in 5 weeks.

## 2020-08-19 ENCOUNTER — Ambulatory Visit (INDEPENDENT_AMBULATORY_CARE_PROVIDER_SITE_OTHER): Payer: No Typology Code available for payment source | Admitting: Obstetrics and Gynecology

## 2020-08-19 ENCOUNTER — Other Ambulatory Visit: Payer: Self-pay | Admitting: *Deleted

## 2020-08-19 ENCOUNTER — Encounter: Payer: Self-pay | Admitting: Obstetrics and Gynecology

## 2020-08-19 VITALS — BP 126/79 | HR 96 | Wt 213.0 lb

## 2020-08-19 DIAGNOSIS — Z6832 Body mass index (BMI) 32.0-32.9, adult: Secondary | ICD-10-CM

## 2020-08-19 DIAGNOSIS — G40319 Generalized idiopathic epilepsy and epileptic syndromes, intractable, without status epilepticus: Secondary | ICD-10-CM

## 2020-08-19 DIAGNOSIS — Z87898 Personal history of other specified conditions: Secondary | ICD-10-CM

## 2020-08-19 DIAGNOSIS — O0990 Supervision of high risk pregnancy, unspecified, unspecified trimester: Secondary | ICD-10-CM

## 2020-08-19 DIAGNOSIS — Z98891 History of uterine scar from previous surgery: Secondary | ICD-10-CM

## 2020-08-19 DIAGNOSIS — O10919 Unspecified pre-existing hypertension complicating pregnancy, unspecified trimester: Secondary | ICD-10-CM

## 2020-08-19 NOTE — Progress Notes (Signed)
ROB 29w Pt had F/U growth scan on 08/18/20. Pt has been at MAU.   Missed GTT on 08/03/20 due to being in MAU.pt asked if she can have GTT lab drawn pt advised to discuss with provider pt is fasting.   Pt states the hospital set her up with [facility] regarding at home assistance needs to discuss this with provider.
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
## 2020-08-25 ENCOUNTER — Other Ambulatory Visit: Payer: Self-pay

## 2020-08-25 ENCOUNTER — Ambulatory Visit (INDEPENDENT_AMBULATORY_CARE_PROVIDER_SITE_OTHER): Payer: No Typology Code available for payment source | Admitting: Licensed Clinical Social Worker

## 2020-08-25 ENCOUNTER — Other Ambulatory Visit: Payer: No Typology Code available for payment source

## 2020-08-25 DIAGNOSIS — O0990 Supervision of high risk pregnancy, unspecified, unspecified trimester: Secondary | ICD-10-CM

## 2020-08-25 DIAGNOSIS — O99340 Other mental disorders complicating pregnancy, unspecified trimester: Secondary | ICD-10-CM | POA: Diagnosis not present

## 2020-08-25 DIAGNOSIS — F32A Depression, unspecified: Secondary | ICD-10-CM

## 2020-08-25 NOTE — Progress Notes (Unsigned)
Pt is here for GTT only, given screening for PHQ and GAD. PHQ 9: 17, GAD: 12.

## 2020-08-26 ENCOUNTER — Encounter: Payer: Self-pay | Admitting: Obstetrics and Gynecology

## 2020-08-26 ENCOUNTER — Other Ambulatory Visit: Payer: Self-pay | Admitting: Obstetrics and Gynecology

## 2020-08-26 DIAGNOSIS — O24419 Gestational diabetes mellitus in pregnancy, unspecified control: Secondary | ICD-10-CM

## 2020-08-26 DIAGNOSIS — O24414 Gestational diabetes mellitus in pregnancy, insulin controlled: Secondary | ICD-10-CM | POA: Insufficient documentation

## 2020-08-26 DIAGNOSIS — O0990 Supervision of high risk pregnancy, unspecified, unspecified trimester: Secondary | ICD-10-CM

## 2020-08-26 HISTORY — DX: Gestational diabetes mellitus in pregnancy, unspecified control: O24.419

## 2020-08-26 LAB — CBC
Hematocrit: 32.8 % — ABNORMAL LOW (ref 34.0–46.6)
Hemoglobin: 10.8 g/dL — ABNORMAL LOW (ref 11.1–15.9)
MCH: 30 pg (ref 26.6–33.0)
MCHC: 32.9 g/dL (ref 31.5–35.7)
MCV: 91 fL (ref 79–97)
Platelets: 182 10*3/uL (ref 150–450)
RBC: 3.6 x10E6/uL — ABNORMAL LOW (ref 3.77–5.28)
RDW: 12.7 % (ref 11.7–15.4)
WBC: 9 10*3/uL (ref 3.4–10.8)

## 2020-08-26 LAB — HIV ANTIBODY (ROUTINE TESTING W REFLEX): HIV Screen 4th Generation wRfx: NONREACTIVE

## 2020-08-26 LAB — RPR: RPR Ser Ql: NONREACTIVE

## 2020-08-26 LAB — GLUCOSE TOLERANCE, 2 HOURS W/ 1HR
Glucose, 1 hour: 205 mg/dL — ABNORMAL HIGH (ref 65–179)
Glucose, 2 hour: 128 mg/dL (ref 65–152)
Glucose, Fasting: 96 mg/dL — ABNORMAL HIGH (ref 65–91)

## 2020-08-27 ENCOUNTER — Other Ambulatory Visit: Payer: Self-pay

## 2020-08-27 MED ORDER — ACCU-CHEK GUIDE W/DEVICE KIT: PACK | 0 refills | Status: DC

## 2020-08-27 MED ORDER — ACCU-CHEK SOFTCLIX LANCETS MISC: 12 refills | Status: DC

## 2020-08-27 MED ORDER — ACCU-CHEK GUIDE VI STRP: ORAL_STRIP | 12 refills | Status: DC
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
## 2020-09-01 ENCOUNTER — Ambulatory Visit (INDEPENDENT_AMBULATORY_CARE_PROVIDER_SITE_OTHER): Payer: No Typology Code available for payment source | Admitting: Obstetrics and Gynecology

## 2020-09-01 ENCOUNTER — Other Ambulatory Visit: Payer: Self-pay

## 2020-09-01 VITALS — BP 121/83 | HR 103 | Wt 213.0 lb

## 2020-09-01 DIAGNOSIS — O24419 Gestational diabetes mellitus in pregnancy, unspecified control: Secondary | ICD-10-CM

## 2020-09-01 DIAGNOSIS — O219 Vomiting of pregnancy, unspecified: Secondary | ICD-10-CM

## 2020-09-01 DIAGNOSIS — Z3A27 27 weeks gestation of pregnancy: Secondary | ICD-10-CM

## 2020-09-01 DIAGNOSIS — O10919 Unspecified pre-existing hypertension complicating pregnancy, unspecified trimester: Secondary | ICD-10-CM

## 2020-09-01 DIAGNOSIS — Z3A30 30 weeks gestation of pregnancy: Secondary | ICD-10-CM

## 2020-09-01 DIAGNOSIS — O0990 Supervision of high risk pregnancy, unspecified, unspecified trimester: Secondary | ICD-10-CM

## 2020-09-01 DIAGNOSIS — Z98891 History of uterine scar from previous surgery: Secondary | ICD-10-CM

## 2020-09-01 NOTE — Progress Notes (Signed)
Pt states she thinks PICC line may be infected.
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
## 2020-09-03 ENCOUNTER — Encounter: Payer: No Typology Code available for payment source | Attending: Obstetrics and Gynecology | Admitting: Registered"

## 2020-09-03 ENCOUNTER — Ambulatory Visit: Payer: No Typology Code available for payment source | Admitting: Registered"

## 2020-09-03 ENCOUNTER — Other Ambulatory Visit: Payer: Self-pay

## 2020-09-03 DIAGNOSIS — O24419 Gestational diabetes mellitus in pregnancy, unspecified control: Secondary | ICD-10-CM

## 2020-09-03 NOTE — Progress Notes (Signed)
Patient was seen for Gestational Diabetes self-management on 09/03/20 Steffanie Rainwater present at appointment.   Start time 1115 and End time 1215   Estimated due date: 11/04/20; [redacted]w[redacted]d  Clinical: Medications: reviewed Medical History: reviewed Labs: OGTT FBS & 1hr, A1c not drawn   Dietary and Lifestyle History: Patient has seizures and migraines finds that drinking Pepsi or Coke helps the migraines. Patient states she was told to limit caffeine intake to <200 mg/day  Physical Activity: walking 15 min 5x/week Stress:  Sleep: varies a lot to none; affected by nausea  24 hr Recall: First Meal:  Snack: Second meal: salad with cheese Snack: Third meal: 2 sandwiches Snack: Beverages: water, ginger ale, sweet tea  NUTRITION INTERVENTION  Nutrition education (E-1) on the following topics:   Initial Follow-up  [x]  []  Definition of Gestational Diabetes [x]  []  Why dietary management is important in controlling blood glucose [x]  []  Effects each nutrient has on blood glucose levels []  []  Simple carbohydrates vs complex carbohydrates []  []  Fluid intake [x]  []  Creating a balanced meal plan [x]  []  Carbohydrate counting  [x]  []  When to check blood glucose levels [x]  []  Proper blood glucose monitoring techniques [x]  []  Effect of stress and stress reduction techniques  [x]  []  Exercise effect on blood glucose levels, appropriate exercise during pregnancy [x]  []  Importance of limiting caffeine and abstaining from alcohol and smoking [x]  []  Medications used for blood sugar control during pregnancy [x]  []  Hypoglycemia and rule of 15 [x]  []  Postpartum self care  Patient already has a meter, is testing pre breakfast but not 2 hours after each meal. Pt reported readings: FBS: >100 mg/d Postprandial: 116-150  Pt reports after vomiting having a reading of 46 mg/dL with symptoms of black spots and very tired.  Patient instructed to monitor glucose levels: FBS: 60 - ? 95 mg/dL (some clinics use 90  for cutoff) 1 hour: ? 140 mg/dL 2 hour: ? mg/dL  Patient received handouts: Nutrition Diabetes and Pregnancy Carbohydrate Counting List  Patient will be seen for follow-up as needed.

## 2020-09-06 ENCOUNTER — Inpatient Hospital Stay (HOSPITAL_COMMUNITY)
Admission: AD | Admit: 2020-09-06 | Discharge: 2020-09-06 | Disposition: A | Discharge status: Home / Self Care | Payer: No Typology Code available for payment source | Attending: Obstetrics and Gynecology | Admitting: Obstetrics and Gynecology

## 2020-09-06 DIAGNOSIS — Z87891 Personal history of nicotine dependence: Secondary | ICD-10-CM | POA: Diagnosis not present

## 2020-09-06 DIAGNOSIS — Z79899 Other long term (current) drug therapy: Secondary | ICD-10-CM | POA: Diagnosis not present

## 2020-09-06 DIAGNOSIS — Z452 Encounter for adjustment and management of vascular access device: Secondary | ICD-10-CM | POA: Insufficient documentation

## 2020-09-06 DIAGNOSIS — Z95828 Presence of other vascular implants and grafts: Secondary | ICD-10-CM | POA: Diagnosis not present

## 2020-09-06 DIAGNOSIS — Z3A31 31 weeks gestation of pregnancy: Secondary | ICD-10-CM | POA: Insufficient documentation
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
## 2020-09-06 NOTE — Progress Notes (Signed)
Patient c/o low infusion and leaking the site. Checked PICC site and noticed catheter was pulled out 4 cm. Patient stated that home care nurse pulled it out when RN changed dressing, after then slow infusion and leaking around the PICC insertion area. According to document, it is supposed to 1 cm catheter out. Recommended chest x-ray for checking the placement of catheter tip to patient's RN, NP & CMN. Patient also c/o small knot on Rt. Anterior FA  which is hurting when infusing fluid. If need further support from VAS team, RN will put in the consult. HS [employer]

## 2020-09-06 NOTE — MAU Note (Signed)
IV team in with pt 

## 2020-09-06 NOTE — MAU Note (Signed)
Last night when doing infusion, noted leaking from site of PIC line.  Dressing is wet, though still intact.  States is red, painful, line is extending 2 inches further than when dressing last changed. ( This was   7/13).  Was to have been done last Wed 7/20.

## 2020-09-08 ENCOUNTER — Other Ambulatory Visit: Payer: Self-pay

## 2020-09-09 ENCOUNTER — Telehealth (HOSPITAL_COMMUNITY): Payer: Self-pay | Admitting: *Deleted

## 2020-09-09 NOTE — Telephone Encounter (Unsigned)
Pt called regarding her PICC line. Was in MAU on Sunday was told Interventional Radiology would call her to arrange  an appointment to replace her PICC. Never received a call. I called IR and they said the order was in a read only form and not seen by the schedulers. Attempted to fix order and unable to .  Notified Provider S. Weinhold,CNM and told to advise patient to go to [facility] if she felt the PICC line was infected if not she should contact her [facility] nurse and they could arrange for replacement

## 2020-09-10 ENCOUNTER — Other Ambulatory Visit: Payer: Self-pay | Admitting: Obstetrics and Gynecology

## 2020-09-10 ENCOUNTER — Inpatient Hospital Stay (HOSPITAL_COMMUNITY): Payer: No Typology Code available for payment source

## 2020-09-10 ENCOUNTER — Encounter (HOSPITAL_COMMUNITY): Payer: Self-pay | Admitting: Emergency Medicine

## 2020-09-10 ENCOUNTER — Inpatient Hospital Stay (HOSPITAL_COMMUNITY)
Admission: EM | Admit: 2020-09-10 | Discharge: 2020-09-10 | Disposition: A | Discharge status: Home / Self Care | Payer: No Typology Code available for payment source | Attending: Obstetrics and Gynecology | Admitting: Obstetrics and Gynecology

## 2020-09-10 DIAGNOSIS — O99283 Endocrine, nutritional and metabolic diseases complicating pregnancy, third trimester: Secondary | ICD-10-CM | POA: Diagnosis not present

## 2020-09-10 DIAGNOSIS — O99013 Anemia complicating pregnancy, third trimester: Secondary | ICD-10-CM | POA: Insufficient documentation

## 2020-09-10 DIAGNOSIS — Z87891 Personal history of nicotine dependence: Secondary | ICD-10-CM | POA: Diagnosis not present

## 2020-09-10 DIAGNOSIS — Z3A32 32 weeks gestation of pregnancy: Secondary | ICD-10-CM | POA: Insufficient documentation

## 2020-09-10 DIAGNOSIS — O10919 Unspecified pre-existing hypertension complicating pregnancy, unspecified trimester: Secondary | ICD-10-CM

## 2020-09-10 DIAGNOSIS — Z3689 Encounter for other specified antenatal screening: Secondary | ICD-10-CM

## 2020-09-10 DIAGNOSIS — T82898A Other specified complication of vascular prosthetic devices, implants and grafts, initial encounter: Secondary | ICD-10-CM

## 2020-09-10 DIAGNOSIS — T82534A Leakage of infusion catheter, initial encounter: Secondary | ICD-10-CM | POA: Diagnosis not present

## 2020-09-10 DIAGNOSIS — O211 Hyperemesis gravidarum with metabolic disturbance: Secondary | ICD-10-CM | POA: Insufficient documentation

## 2020-09-10 DIAGNOSIS — E876 Hypokalemia: Secondary | ICD-10-CM

## 2020-09-10 DIAGNOSIS — D649 Anemia, unspecified: Secondary | ICD-10-CM

## 2020-09-10 DIAGNOSIS — O219 Vomiting of pregnancy, unspecified: Secondary | ICD-10-CM

## 2020-09-10 DIAGNOSIS — O10013 Pre-existing essential hypertension complicating pregnancy, third trimester: Secondary | ICD-10-CM | POA: Diagnosis not present

## 2020-09-10 DIAGNOSIS — R109 Unspecified abdominal pain: Secondary | ICD-10-CM | POA: Diagnosis present

## 2020-09-10 DIAGNOSIS — O210 Mild hyperemesis gravidarum: Secondary | ICD-10-CM

## 2020-09-10 DIAGNOSIS — O99019 Anemia complicating pregnancy, unspecified trimester: Secondary | ICD-10-CM

## 2020-09-10 LAB — URINALYSIS, ROUTINE W REFLEX MICROSCOPIC
Bilirubin Urine: NEGATIVE
Glucose, UA: NEGATIVE mg/dL
Hgb urine dipstick: NEGATIVE
Ketones, ur: NEGATIVE mg/dL
Leukocytes,Ua: NEGATIVE
Nitrite: NEGATIVE
Protein, ur: NEGATIVE mg/dL
Specific Gravity, Urine: 1.013 (ref 1.005–1.030)
pH: 6 (ref 5.0–8.0)

## 2020-09-10 LAB — CBC
HCT: 30.4 % — ABNORMAL LOW (ref 36.0–46.0)
Hemoglobin: 10.4 g/dL — ABNORMAL LOW (ref 12.0–15.0)
MCH: 30.3 pg (ref 26.0–34.0)
MCHC: 34.2 g/dL (ref 30.0–36.0)
MCV: 88.6 fL (ref 80.0–100.0)
Platelets: 170 10*3/uL (ref 150–400)
RBC: 3.43 MIL/uL — ABNORMAL LOW (ref 3.87–5.11)
RDW: 12.6 % (ref 11.5–15.5)
WBC: 8.5 10*3/uL (ref 4.0–10.5)
nRBC: 0 % (ref 0.0–0.2)

## 2020-09-10 LAB — COMPREHENSIVE METABOLIC PANEL
ALT: 18 U/L (ref 0–44)
AST: 14 U/L — ABNORMAL LOW (ref 15–41)
Albumin: 2.8 g/dL — ABNORMAL LOW (ref 3.5–5.0)
Alkaline Phosphatase: 78 U/L (ref 38–126)
Anion gap: 9 (ref 5–15)
BUN: 6 mg/dL (ref 6–20)
CO2: 19 mmol/L — ABNORMAL LOW (ref 22–32)
Calcium: 9.1 mg/dL (ref 8.9–10.3)
Chloride: 107 mmol/L (ref 98–111)
Creatinine, Ser: 0.64 mg/dL (ref 0.44–1.00)
GFR, Estimated: >60 mL/min (ref >60)
Glucose, Bld: 87 mg/dL (ref 70–99)
Potassium: 3.3 mmol/L — ABNORMAL LOW (ref 3.5–5.1)
Sodium: 135 mmol/L (ref 135–145)
Total Bilirubin: 0.5 mg/dL (ref 0.3–1.2)
Total Protein: 6.1 g/dL — ABNORMAL LOW (ref 6.5–8.1)

## 2020-09-10 LAB — GLUCOSE, CAPILLARY: Glucose-Capillary: 91 mg/dL (ref 70–99)

## 2020-09-10 LAB — PROTEIN / CREATININE RATIO, URINE
Creatinine, Urine: 130.46 mg/dL
Protein Creatinine Ratio: 0.12 mg/mg{Cre} (ref 0.00–0.15)
Total Protein, Urine: 16 mg/dL

## 2020-09-10 IMAGING — XA IR REPLACE PICC
1 series · 1 of 1 positions shown · IV contrast (agent unspecified)
Comparison: none

INDICATION: Patient with a history of hyperemesis gravidarum requiring PICC for
medical management. Right brachial single lumen 35 cm PICC placed
[DATE] by IV team. Patient presents today with retracted/occluded
PICC. IR asked to evaluate this patient for PICC exchange verus new
placement.

EXAM:
ULTRASOUND AND FLUOROSCOPIC GUIDED PICC LINE INSERTION
MEDICATIONS:
1% lidocaine, 2 ml
CONTRAST:  None
FLUOROSCOPY TIME:  30 seconds (1 mGy)
COMPLICATIONS:
None immediate.
TECHNIQUE: The procedure, risks, benefits, and alternatives were explained to
the patient and informed written consent was obtained.

[Series 1: fl (-) angio · 1 of 1 slices shown]
[im 1/1]
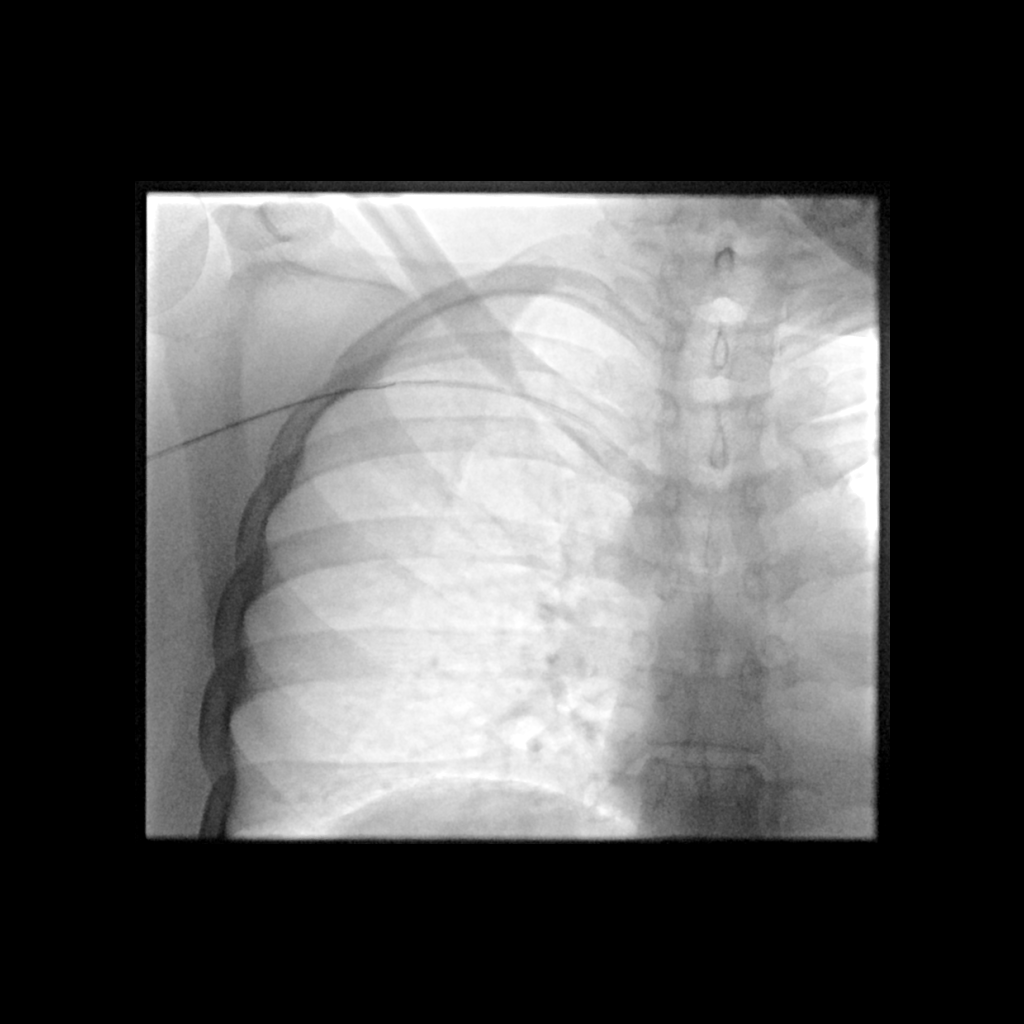

[1 of 1 positions shown; findings below may reference images not displayed]

The right upper extremity was prepped with chlorhexidine in a
sterile fashion, and a sterile drape was applied covering the
operative field. Maximum barrier sterile technique with sterile
gowns and gloves were used for the procedure. A timeout was
performed prior to the initiation of the procedure. Local anesthesia
was provided with 1% lidocaine.

Fluoroscopy identified existing PICC with tip near the right
shoulder/clavicle area. A guidewire was easily inserted into the
existing PICC, no resistance met. The existing catheter was removed
and a sheath was placed over the guidewire. The guidewire was
advanced to the level of the superior caval-atrial junction for
measurement purposes and the PICC line was cut to length. A
peel-away sheath was placed and a 34 cm, 5 [place], single lumen was
inserted to level of the superior caval-atrial junction. A post
procedure spot fluoroscopic was obtained. The catheter easily
aspirated and flushed and was secured in place. A dressing was
placed. The patient tolerated the procedure well without immediate
post procedural complication.
FINDINGS: After catheter placement, the tip lies within the superior
cavoatrial junction. The catheter aspirates and flushes normally and
is ready for immediate use.
IMPRESSION: Successful ultrasound and fluoroscopic guided placement of a right
brachial vein approach, 34 cm, 5 French, single lumen PICC with tip
at the superior caval-atrial junction. The PICC line is ready for
immediate use. Read by: AUJLA, NP

## 2020-09-10 MED ORDER — FAMOTIDINE IN NACL 20-0.9 MG/50ML-% IV SOLN
20.0000 mg | Freq: Once | INTRAVENOUS | Status: AC
  Administered 2020-09-10: 20 mg via INTRAVENOUS
  Filled 2020-09-10: qty 50

## 2020-09-10 MED ORDER — ACETAMINOPHEN 500 MG PO TABS
1000.0000 mg | ORAL_TABLET | Freq: Four times a day (QID) | ORAL | Status: DC | PRN
  Administered 2020-09-10: 1000 mg via ORAL
  Filled 2020-09-10 (×2): qty 2

## 2020-09-10 MED ORDER — HEPARIN SOD (PORK) LOCK FLUSH 100 UNIT/ML IV SOLN
INTRAVENOUS | Status: AC
  Filled 2020-09-10: qty 5

## 2020-09-10 MED ORDER — ACETAMINOPHEN 500 MG PO TABS: 1000.0000 mg | ORAL_TABLET | Freq: Four times a day (QID) | ORAL | 0 refills | Status: DC | PRN

## 2020-09-10 MED ORDER — SODIUM CHLORIDE 0.9 % IV SOLN
8.0000 mg | Freq: Once | INTRAVENOUS | Status: AC
  Administered 2020-09-10: 8 mg via INTRAVENOUS
  Filled 2020-09-10: qty 4

## 2020-09-10 MED ORDER — LIDOCAINE HCL 1 % IJ SOLN
INTRAMUSCULAR | Status: DC | PRN
Start: 2020-09-10 — End: 2020-09-10
  Administered 2020-09-10: 5 mL

## 2020-09-10 MED ORDER — LACTATED RINGERS IV BOLUS
1000.0000 mL | Freq: Once | INTRAVENOUS | Status: AC
  Administered 2020-09-10: 1000 mL via INTRAVENOUS

## 2020-09-10 MED ORDER — NIRMATRELVIR/RITONAVIR (PAXLOVID)TABLET: 3.0000 | ORAL_TABLET | Freq: Two times a day (BID) | ORAL | Status: DC

## 2020-09-10 MED ORDER — CYCLOBENZAPRINE HCL 5 MG PO TABS
10.0000 mg | ORAL_TABLET | Freq: Three times a day (TID) | ORAL | Status: DC | PRN
  Administered 2020-09-10: 10 mg via ORAL
  Filled 2020-09-10: qty 2

## 2020-09-10 MED ORDER — LIDOCAINE HCL 1 % IJ SOLN
INTRAMUSCULAR | Status: AC
  Filled 2020-09-10: qty 20

## 2020-09-10 MED ORDER — CYCLOBENZAPRINE HCL 10 MG PO TABS: 10.0000 mg | ORAL_TABLET | Freq: Three times a day (TID) | ORAL | 0 refills | Status: DC | PRN

## 2020-09-10 MED ORDER — LACTATED RINGERS IV SOLN: INTRAVENOUS | Status: DC

## 2020-09-10 NOTE — ED Triage Notes (Signed)
Patient here for evaluation of PICC line displacement and right sided abdominal pain that started earlier this week. Patient reports becoming frustrated when she reported the  issue to OB who told her "a misplaced PICC line is better than no PICC line". Patient alert, oriented, and in no apparent distress at this time.

## 2020-09-10 NOTE — ED Provider Notes (Signed)
Emergency Medicine Provider OB Triage Evaluation Note  Leah Shea is a 35 y.o. female, [phone], at [redacted]w[redacted]d gestation who presents to the emergency department with complaints of nominal pain and PICC line problem.  Patient states she has had intermittent issues with her PICC line.  Was seen at [facility]U 4 days ago had outpatient referral to IR for placement.  Has pulled out even more so over the last 2 days.  Is not flushing despite heparinizing.  Patient did state that she has been having some right-sided abdominal pain.  No fluid leakage, vaginal bleeding.  She still having multiple episodes of NBNB emesis at home.  Review of  Systems  Positive: Abdominal pain, emesis Negative: diarrhea  Physical Exam  BP (!) 149/98 (BP Location: Left Arm)   Pulse (!) 124   Temp 98.8 F (37.1 C) (Oral)   Resp 18   LMP 01/29/2020   SpO2 98%  General: Awake, no distress  HEENT: Atraumatic  Resp: Normal effort  Cardiac: Normal rate Abd: Gravid abdomen MSK: PICC line partially removed no streaking up the arm. Neuro: Speech clear  Medical Decision Making  Pt evaluated for pregnancy concern and is stable for transfer to MAU. Pt is in agreement with plan for transfer.  10:49 AM Discussed with MAU APP, who accepts patient in transfer.  Clinical Impression  Abd pain in preg PICC line problem   Patient needs to be medically cleared as far as her intractable emesis, abdominal pain.  With regards to her PICC line malfunction.  IR likely needs to replace this given has been heparinize and is not flushing. Area not red, warmth and no streaking, low suspicion for infectious process. Compartments soft. NV intact.    Yurem Viner A, PA-C 09/10/20 1049    Alvira Monday, MD 09/10/20 1058

## 2020-09-10 NOTE — MAU Note (Signed)
.  Leah Shea is a 35 y.o. at [redacted]w[redacted]d here in MAU reporting: abdominal pain in her RUQ that started this morning around 0400 this morning, She states that she thinks the pain is from throwing up because her PICC line is misplaced. She was supposed to come to the hospital for IR to have her PICC line replaced. Has thrown up 11x in the past 24hrs. Has had loose stools for x3 days. Denies VB or LOF. Endorses good fetal movement.   Pain score: 7 Vitals:   09/10/20 1042 09/10/20 1117  BP: (!) 149/98 (!) 142/89  Pulse: (!) 124 100  Resp: 18 20  Temp: 98.8 F (37.1 C) 98.6 F (37 C)  SpO2: 98% 98%     FHT:147 Lab orders placed from triage:  UA

## 2020-09-10 NOTE — MAU Provider Note (Signed)
History     CSN: [id number]  Arrival date and time: 09/10/20 1022   Event Date/Time   First Provider Initiated Contact with Patient 09/10/20 1147      Chief Complaint  Patient presents with   Abdominal Pain   35 y.o. [id number] _0 .1 with hx HEG and CHTN presenting with N/V, and problems with PICC line. Reports difficulty with PICC line leaking and bleeding for 2 weeks. Also reports pain at insertion site and would not flush this am. States PICC is coming out. Reports several episodes of N/V over the last few days and RUQ pain. Rates pain 7/10. Hasn't treated it. Denies urinary sx. No VB or LOF. Reports occasional BH ctx. Reports good FM.    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    Past Surgical History:  Procedure Laterality Date   BREAST SURGERY Bilateral    reductions   CESAREAN SECTION  2010, 2012,2014   x 3   EYE SURGERY     x 10 as a child   PILONIDAL CYST EXCISION  2020   TONSILLECTOMY N/A 10/07/2013   Procedure: TONSILLECTOMY;  Surgeon: Melida Quitter, MD;  Location: [facility] OR;  Service: ENT;  Laterality: N/A;   TUMOR REMOVAL     tumor removed from back    Family History  Problem Relation Age of Onset   Breast cancer Mother    Breast cancer Maternal Aunt 36        metastatic   Cancer Maternal Uncle        unk type   Breast cancer Maternal Grandmother    Prostate cancer Maternal Grandfather        metastatic   Cancer Maternal Aunt        either breast or ovarian     Social History   Tobacco Use   Smoking status: Former    Packs/day: 0.25    Years: 1.00    Pack years: 0.25    Types: Cigarettes    Quit date: 07/02/2013    Years since quitting: 7.1   Smokeless tobacco: Never  Vaping Use   Vaping Use: Never used  Substance Use Topics   Alcohol use: Not Currently   Drug use: Not Currently    Types: Marijuana    Comment: last used January 3    Allergies:  Allergies  Allergen Reactions   Other Anaphylaxis and Hives  Mushrooms   Nsaids Hives, Swelling and Other (See Comments)    Body burns    Reglan [Metoclopramide] Other (See Comments)    anxiety   Sulfa Antibiotics Other (See Comments)    burns    No medications prior to admission.    Review of Systems  Constitutional:  Negative for fever.  Eyes:  Negative for visual disturbance.  Respiratory:  Positive for shortness of breath.   Cardiovascular:  Negative for chest pain.  Gastrointestinal:  Positive for abdominal pain, nausea and vomiting.  Genitourinary:  Negative for dysuria, frequency, hematuria, urgency, vaginal bleeding and vaginal discharge.  Musculoskeletal:  Positive for back pain.  Neurological:  Positive for dizziness. Negative for headaches.  Physical Exam   Blood pressure 127/84, pulse 93, temperature 98.4 F (36.9 C), temperature source Oral, resp. rate 15, last menstrual period 01/29/2020, SpO2 100 %. Patient Vitals for the past 24 hrs:  BP Temp Temp src Pulse Resp SpO2  09/10/20 1740 127/84 -- -- 93 15 --  09/10/20 1656 (!) 147/82 98.4 F (36.9 C) Oral 98 14 100 %  09/10/20 1346 124/73 -- -- (!) 102 -- 98 %  09/10/20 1341 -- -- -- -- -- 99 %  09/10/20 1336 -- -- -- -- -- 99 %  09/10/20 1331 122/77 -- -- 100 (!) 22 100 %  09/10/20 1326 -- -- -- -- --  99 %  09/10/20 1316 133/88 -- -- (!) 101 -- --  09/10/20 1301 (!) 135/93 -- -- (!) 113 -- --  09/10/20 1246 124/89 -- -- 96 -- --  09/10/20 1231 105/90 -- -- (!) 103 -- --  09/10/20 1220 133/83 -- -- 97 -- --  09/10/20 1217 138/81 -- -- 100 -- --  09/10/20 1201 (!) 138/92 -- -- 99 -- 98 %  09/10/20 1146 137/86 -- -- 99 -- 96 %  09/10/20 1136 (!) 135/94 -- -- 97 -- 99 %  09/10/20 1117 (!) 142/89 98.6 F (37 C) Oral 100 20 98 %  09/10/20 1042 (!) 149/98 98.8 F (37.1 C) Oral (!) 124 18 98 %   Physical Exam Vitals and nursing note reviewed.  Constitutional:      General: She is not in acute distress (tearful).    Appearance: Normal appearance.  HENT:     Head: Normocephalic and atraumatic.  Pulmonary:     Effort: Pulmonary effort is normal. No respiratory distress.  Abdominal:     Palpations: Abdomen is soft.     Tenderness: There is no abdominal tenderness.  Genitourinary:    Comments: VE: closed/thick Musculoskeletal:        General: Normal range of motion.     Cervical back: Normal range of motion.  Skin:    General: Skin is warm and dry.     Comments: PICC in place right arm, insertion area tender to touch, no bleeding, erythema, edema, or drainage.  Neurological:     General: No focal deficit present.     Mental Status: She is alert and oriented to person, place, and time.  Psychiatric:        Mood and Affect: Mood normal.        Behavior: Behavior normal.  EFM: 145 bpm, mod variability, + accels, no decels Toco: UI  Results for orders placed or performed during the hospital encounter of 09/10/20 (from the past 24 hour(s))  Urinalysis, Routine w reflex microscopic Urine, Clean Catch     Status: Abnormal   Collection Time: 09/10/20 11:30 AM  Result Value Ref Range   Color, Urine YELLOW YELLOW   APPearance HAZY (A) CLEAR   Specific Gravity, Urine 1.013 1.005 - 1.030   pH 6.0 5.0 - 8.0   Glucose, UA NEGATIVE NEGATIVE mg/dL   Hgb urine dipstick NEGATIVE NEGATIVE    Bilirubin Urine NEGATIVE NEGATIVE   Ketones, ur NEGATIVE NEGATIVE mg/dL   Protein, ur NEGATIVE NEGATIVE mg/dL   Nitrite NEGATIVE NEGATIVE   Leukocytes,Ua NEGATIVE NEGATIVE  Glucose, capillary     Status: None   Collection Time: 09/10/20 11:30 AM  Result Value Ref Range   Glucose-Capillary 91 70 - 99 mg/dL  Protein / creatinine ratio, urine     Status: None   Collection Time: 09/10/20 11:30 AM  Result Value Ref Range   Creatinine, Urine 130.46 mg/dL   Total Protein, Urine 16 mg/dL   Protein Creatinine Ratio 0.12 0.00 - 0.15 mg/mg[Cre]  CBC     Status: Abnormal   Collection Time: 09/10/20 12:15 PM  Result Value Ref Range   WBC 8.5 4.0 - 10.5 K/uL   RBC 3.43 (L) 3.87 - 5.11 MIL/uL   Hemoglobin 10.4 (L) 12.0 - 15.0 g/dL   HCT 30.4 (L) 36.0 - 46.0 %   MCV 88.6 80.0 - 100.0 fL   MCH 30.3 26.0 - 34.0 pg   MCHC 34.2 30.0 - 36.0 g/dL   RDW 12.6 11.5 - 15.5 %   Platelets 170 150 - 400 K/uL   nRBC 0.0 0.0 - 0.2 %  Comprehensive metabolic panel     Status: Abnormal   Collection Time: 09/10/20 12:15 PM  Result Value Ref Range   Sodium 135 135 - 145 mmol/L   Potassium 3.3 (L) 3.5 - 5.1 mmol/L   Chloride 107 98 - 111 mmol/L   CO2 19 (L) 22 - 32 mmol/L   Glucose, Bld 87 70 - 99 mg/dL   BUN 6 6 - 20 mg/dL   Creatinine, Ser 0.64 0.44 - 1.00 mg/dL   Calcium 9.1 8.9 - 10.3 mg/dL   Total Protein 6.1 (L) 6.5 - 8.1 g/dL   Albumin 2.8 (L) 3.5 - 5.0 g/dL   AST 14 (L) 15 - 41 U/L   ALT 18 0 - 44 U/L   Alkaline Phosphatase 78 38 - 126 U/L   Total Bilirubin 0.5 0.3 - 1.2 mg/dL   GFR, Estimated >60 >60 mL/min   Anion gap 9 5 - 15   IR PICC REPLACEMENT RIGHT INC IMG GUIDE  Result Date: 09/10/2020 INDICATION: Patient with a history of hyperemesis gravidarum requiring PICC for medical management. Right brachial single lumen 35 cm PICC placed 08/05/20 by IV team. Patient presents today with retracted/occluded PICC. IR asked to evaluate this patient for PICC exchange verus new placement. EXAM:  ULTRASOUND AND FLUOROSCOPIC GUIDED PICC LINE INSERTION MEDICATIONS: 1% lidocaine, 2 ml CONTRAST:  None FLUOROSCOPY TIME:  30 seconds (1 mGy) COMPLICATIONS: None immediate. TECHNIQUE: The procedure, risks, benefits, and alternatives were explained to the patient and informed written consent was obtained. The right upper extremity was prepped with chlorhexidine in a sterile fashion, and a sterile drape was applied covering the operative field. Maximum barrier sterile technique with sterile gowns and gloves were used for the procedure. A timeout was performed prior to the initiation of the procedure. Local anesthesia was provided with 1% lidocaine. Fluoroscopy identified existing PICC with tip near the right shoulder/clavicle area. A guidewire was easily inserted into the existing PICC, no resistance met. The existing catheter was removed and a  sheath was placed over the guidewire. The guidewire was advanced to the level of the superior caval-atrial junction for measurement purposes and the PICC line was cut to length. A peel-away sheath was placed and a 34 cm, 5 [place], single lumen was inserted to level of the superior caval-atrial junction. A post procedure spot fluoroscopic was obtained. The catheter easily aspirated and flushed and was secured in place. A dressing was placed. The patient tolerated the procedure well without immediate post procedural complication. FINDINGS: After catheter placement, the tip lies within the superior cavoatrial junction. The catheter aspirates and flushes normally and is ready for immediate use. IMPRESSION: Successful ultrasound and fluoroscopic guided placement of a right brachial vein approach, 34 cm, 5 [place], single lumen PICC with tip at the superior caval-atrial junction. The PICC line is ready for immediate use. Read by: Soyla Dryer, NP Electronically Signed   By: Markus Daft M.D.   On: 09/10/2020 16:34    MAU Course  Procedures LR Zofran Tylenol  MDM Chart  reviewed: pregnancy complicated by CHTN, HEG, seizure dz, previous CS x3, A1GDM, and anxiety and depression. Labs ordered and reviewed. No signs of PEC. 1220: Consulted with Markus Daft regarding PICC exchange today, order placed.  1330: notified by RN that pt appears SOB. Pt reassessed>increased WOB, RR 22, o2 100%, lungs CTAB, pt reports SOB since this am. Suspect anxiety is precipitating sx. Pt also reports right sided back pain. TTP in right thoracic region.  1740: per RN pt states she threw up but thinks the pills stayed down, episode not witnessed and no emesis was seen. New PICC line in place, pt states arm feels sore. Pt tearful, when questioned why she states "it's all a lot". Pt reassured and comforted. Pain likely MSK from vomiting, encouraged pt to use Flexeril, Tylenol, and heating pad. Cervix closed/thick. She is stable for discharge home.  Assessment and Plan   1. [redacted] weeks gestation of pregnancy   2. NST (non-stress test) reactive   3. Chronic hypertension affecting pregnancy   4. Hypokalemia   5. Occluded PICC line, initial encounter ([place])   6. Hyperemesis affecting pregnancy, antepartum   7. Anemia during pregnancy    Discharge home Follow up at [facility] next week as scheduled Rx Flexeril Return precautions  Allergies as of 09/10/2020       Reactions   Other Anaphylaxis, Hives   Mushrooms   Nsaids Hives, Swelling, Other (See Comments)   Body burns   Reglan [metoclopramide] Other (See Comments)   anxiety   Sulfa Antibiotics Other (See Comments)   burns        Medication List     TAKE these medications    Accu-Chek Guide test strip Generic drug: glucose blood Check blood glucose by percutaneous route 4 (four) times daily   Accu-Chek Guide w/Device Kit Check blood glucose by percutaneous route 4 (four) times daily   Accu-Chek Softclix Lancets lancets Check blood glucose by percutaneous route 4 (four) times daily   acetaminophen 500 MG tablet Commonly known  as: TYLENOL Take 2 tablets (1,000 mg total) by mouth every 6 (six) hours as needed for moderate pain. What changed:  when to take this reasons to take this   Blood Pressure Kit Devi 1 kit by Does not apply route once a week.   busPIRone 15 MG tablet Commonly known as: BUSPAR Take by mouth.   cyclobenzaprine 10 MG tablet Commonly known as: FLEXERIL Take 1 tablet (10 mg total) by mouth 3 (three)  times daily as needed (back pain/muscle pain).   diphenhydrAMINE 50 MG tablet Commonly known as: BENADRYL Take 0.5 tablets (25 mg total) by mouth at bedtime as needed for sleep.   Doxylamine-Pyridoxine 10-10 MG Tbec Commonly known as: Diclegis Take 2 tablets by mouth at bedtime. If symptoms persist, add one tablet in the morning and one in the afternoon   escitalopram 10 MG tablet Commonly known as: LEXAPRO Take 1 tablet (10 mg total) by mouth daily. What changed: Another medication with the same name was removed. Continue taking this medication, and follow the directions you see here.   folic acid 1 MG tablet Commonly known as: FOLVITE Take 1 tablet (1 mg total) by mouth daily.   hydrOXYzine 25 MG capsule Commonly known as: VISTARIL Take 2 capsules (50 mg total) by mouth 3 (three) times daily as needed for anxiety.   levETIRAcetam 500 MG tablet Commonly known as: KEPPRA Take 1 tablet (500 mg total) by mouth 2 (two) times daily. What changed: Another medication with the same name was removed. Continue taking this medication, and follow the directions you see here.   ondansetron 8 MG disintegrating tablet Commonly known as: Zofran ODT Take 1 tablet (8 mg total) by mouth every 8 (eight) hours as needed for nausea or vomiting.   pantoprazole 40 MG tablet Commonly known as: Protonix Take 1 tablet (40 mg total) by mouth daily.   promethazine 25 MG tablet Commonly known as: PHENERGAN Take 1 tablet (25 mg total) by mouth every 6 (six) hours as needed for nausea or vomiting.    scopolamine 1 MG/3DAYS Commonly known as: TRANSDERM-SCOP Place 1 patch (1.5 mg total) onto the skin every 3 (three) days.   Transderm-Scop 1 MG/3DAYS Generic drug: scopolamine Place 1 patch  onto the skin every 3 (three) days.   Vitamin D (Ergocalciferol) 1.25 MG (50000 UNIT) Caps capsule Commonly known as: DRISDOL Take 1 capsule (50,000 Units total) by mouth every 7 (seven) days.       Julianne Handler, CNM 09/10/2020, 6:01 PM

## 2020-09-10 NOTE — Procedures (Signed)
PROCEDURE SUMMARY:  PICC Exchange:  Successful placement of image-guided single lumen PICC line to the right brachial vein. Length 34 cm. Tip at lower SVC/RA. No complications. EBL = <2 ml. Ready for use.  Please see imaging section of Epic for full dictation.   Mickie Kay, NP 09/10/2020 4:35 PM

## 2020-09-15 ENCOUNTER — Telehealth: Payer: Self-pay

## 2020-09-15 ENCOUNTER — Other Ambulatory Visit: Payer: Self-pay

## 2020-09-15 ENCOUNTER — Ambulatory Visit (INDEPENDENT_AMBULATORY_CARE_PROVIDER_SITE_OTHER): Payer: No Typology Code available for payment source | Admitting: Obstetrics and Gynecology

## 2020-09-15 ENCOUNTER — Encounter: Payer: Self-pay | Admitting: Obstetrics and Gynecology

## 2020-09-15 VITALS — BP 125/81 | HR 118 | Wt 220.0 lb

## 2020-09-15 DIAGNOSIS — G40319 Generalized idiopathic epilepsy and epileptic syndromes, intractable, without status epilepticus: Secondary | ICD-10-CM

## 2020-09-15 DIAGNOSIS — O24415 Gestational diabetes mellitus in pregnancy, controlled by oral hypoglycemic drugs: Secondary | ICD-10-CM

## 2020-09-15 DIAGNOSIS — D573 Sickle-cell trait: Secondary | ICD-10-CM

## 2020-09-15 DIAGNOSIS — O99113 Other diseases of the blood and blood-forming organs and certain disorders involving the immune mechanism complicating pregnancy, third trimester: Secondary | ICD-10-CM

## 2020-09-15 DIAGNOSIS — O10919 Unspecified pre-existing hypertension complicating pregnancy, unspecified trimester: Secondary | ICD-10-CM

## 2020-09-15 DIAGNOSIS — O219 Vomiting of pregnancy, unspecified: Secondary | ICD-10-CM

## 2020-09-15 DIAGNOSIS — Z98891 History of uterine scar from previous surgery: Secondary | ICD-10-CM

## 2020-09-15 DIAGNOSIS — D696 Thrombocytopenia, unspecified: Secondary | ICD-10-CM

## 2020-09-15 DIAGNOSIS — R87612 Low grade squamous intraepithelial lesion on cytologic smear of cervix (LGSIL): Secondary | ICD-10-CM

## 2020-09-15 DIAGNOSIS — O0990 Supervision of high risk pregnancy, unspecified, unspecified trimester: Secondary | ICD-10-CM

## 2020-09-15 MED ORDER — METFORMIN HCL 500 MG PO TABS: 500.0000 mg | ORAL_TABLET | Freq: Two times a day (BID) | ORAL | 5 refills | Status: DC

## 2020-09-15 NOTE — Progress Notes (Signed)
ROB [redacted]w[redacted]d  Pt recently seen in hospital for PICC Exchange. Pt needs order placed for home infusion   *Pt brought log of blood sugars.  *Pt consents to student in exam rm.
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
## 2020-09-15 NOTE — Telephone Encounter (Signed)
Patient has been approved for [facility] to to come to her house for weekly cleaning of her PIC line.

## 2020-09-18 ENCOUNTER — Other Ambulatory Visit (INDEPENDENT_AMBULATORY_CARE_PROVIDER_SITE_OTHER): Payer: No Typology Code available for payment source | Admitting: Obstetrics and Gynecology

## 2020-09-18 DIAGNOSIS — O24414 Gestational diabetes mellitus in pregnancy, insulin controlled: Secondary | ICD-10-CM

## 2020-09-18 MED ORDER — INSULIN PEN NEEDLE 31G X 6 MM MISC: 1.0000 "application " | Freq: Four times a day (QID) | 5 refills | Status: DC | PRN

## 2020-09-18 MED ORDER — LANTUS SOLOSTAR 100 UNIT/ML ~~LOC~~ SOPN: 40.0000 [IU] | PEN_INJECTOR | Freq: Every day | SUBCUTANEOUS | 11 refills | Status: DC

## 2020-09-18 MED ORDER — INSULIN LISPRO JUNIOR KWIKPEN 100 UNIT/ML ~~LOC~~ SOPN: 8.0000 [IU] | PEN_INJECTOR | Freq: Three times a day (TID) | SUBCUTANEOUS | 6 refills | Status: DC

## 2020-09-18 NOTE — Progress Notes (Signed)
Pt has not tolerated oral metformin due to continued nausea.  Calculated dose by body weight based off 100 kg current weight would be 54  units lantus per day and 36 units regular insulin split between 3 meals (12 u/meal).  As patient is new start and her po intake is variable will decrease starting doses to 40 units of lantus daily and 8 units of regular insulin with each meal.  Pt will have f/u during the next week to eval blood sugars.

## 2020-09-21 ENCOUNTER — Ambulatory Visit: Payer: No Typology Code available for payment source | Admitting: *Deleted

## 2020-09-21 ENCOUNTER — Other Ambulatory Visit: Payer: Self-pay

## 2020-09-21 ENCOUNTER — Other Ambulatory Visit: Payer: Self-pay | Admitting: Obstetrics and Gynecology

## 2020-09-21 ENCOUNTER — Ambulatory Visit: Payer: No Typology Code available for payment source | Attending: Obstetrics

## 2020-09-21 ENCOUNTER — Encounter: Payer: Self-pay | Admitting: *Deleted

## 2020-09-21 VITALS — BP 151/88 | HR 120

## 2020-09-21 DIAGNOSIS — Z6832 Body mass index (BMI) 32.0-32.9, adult: Secondary | ICD-10-CM

## 2020-09-21 DIAGNOSIS — Z794 Long term (current) use of insulin: Secondary | ICD-10-CM

## 2020-09-21 DIAGNOSIS — Z3A27 27 weeks gestation of pregnancy: Secondary | ICD-10-CM | POA: Insufficient documentation

## 2020-09-21 DIAGNOSIS — O0990 Supervision of high risk pregnancy, unspecified, unspecified trimester: Secondary | ICD-10-CM | POA: Insufficient documentation

## 2020-09-21 DIAGNOSIS — G40909 Epilepsy, unspecified, not intractable, without status epilepticus: Secondary | ICD-10-CM | POA: Diagnosis not present

## 2020-09-21 DIAGNOSIS — E669 Obesity, unspecified: Secondary | ICD-10-CM

## 2020-09-21 DIAGNOSIS — Z3A33 33 weeks gestation of pregnancy: Secondary | ICD-10-CM | POA: Diagnosis not present

## 2020-09-21 DIAGNOSIS — O99353 Diseases of the nervous system complicating pregnancy, third trimester: Secondary | ICD-10-CM | POA: Diagnosis not present

## 2020-09-21 DIAGNOSIS — Z362 Encounter for other antenatal screening follow-up: Secondary | ICD-10-CM | POA: Diagnosis not present

## 2020-09-21 DIAGNOSIS — O99213 Obesity complicating pregnancy, third trimester: Secondary | ICD-10-CM | POA: Diagnosis present

## 2020-09-21 DIAGNOSIS — O10919 Unspecified pre-existing hypertension complicating pregnancy, unspecified trimester: Secondary | ICD-10-CM

## 2020-09-21 DIAGNOSIS — O10013 Pre-existing essential hypertension complicating pregnancy, third trimester: Secondary | ICD-10-CM

## 2020-09-21 DIAGNOSIS — O24414 Gestational diabetes mellitus in pregnancy, insulin controlled: Secondary | ICD-10-CM | POA: Insufficient documentation

## 2020-09-21 DIAGNOSIS — O10913 Unspecified pre-existing hypertension complicating pregnancy, third trimester: Secondary | ICD-10-CM | POA: Diagnosis not present

## 2020-09-21 DIAGNOSIS — Z148 Genetic carrier of other disease: Secondary | ICD-10-CM

## 2020-09-21 DIAGNOSIS — O09293 Supervision of pregnancy with other poor reproductive or obstetric history, third trimester: Secondary | ICD-10-CM | POA: Insufficient documentation

## 2020-09-21 DIAGNOSIS — O24415 Gestational diabetes mellitus in pregnancy, controlled by oral hypoglycemic drugs: Secondary | ICD-10-CM

## 2020-09-21 DIAGNOSIS — O34219 Maternal care for unspecified type scar from previous cesarean delivery: Secondary | ICD-10-CM

## 2020-09-21 IMAGING — US US MFM OB FOLLOW-UP
1 series · 12 of 28 positions shown · non-contrast
Comparison: none

[Series 1: us mfm ob follow-up · 48 acquisitions, 12 frames shown]
[im 2/48]
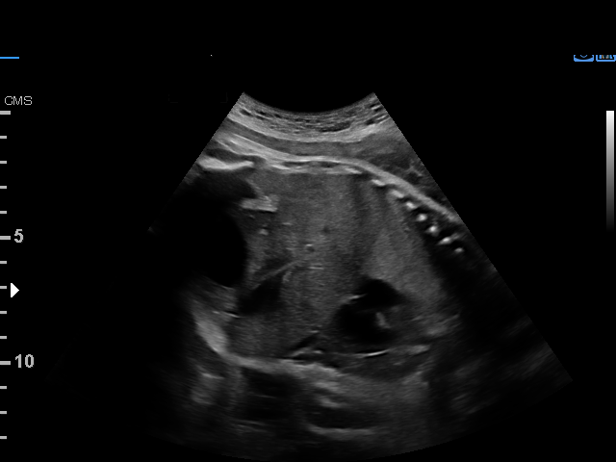
[im 6/48]
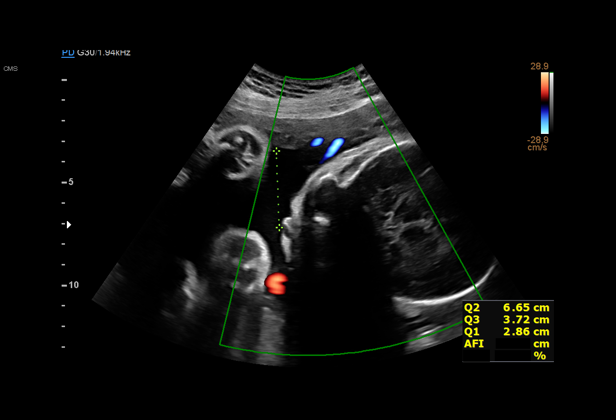
[im 9/48]
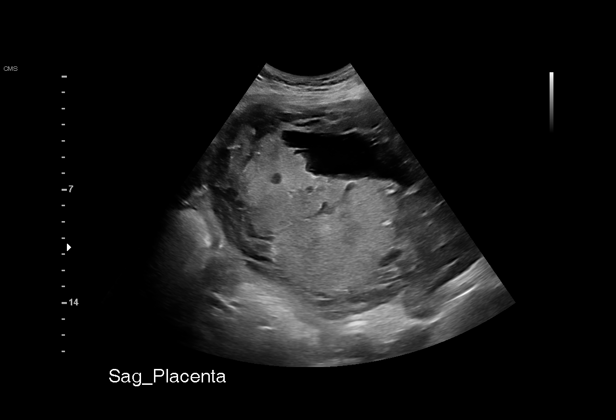
[im 14/48]
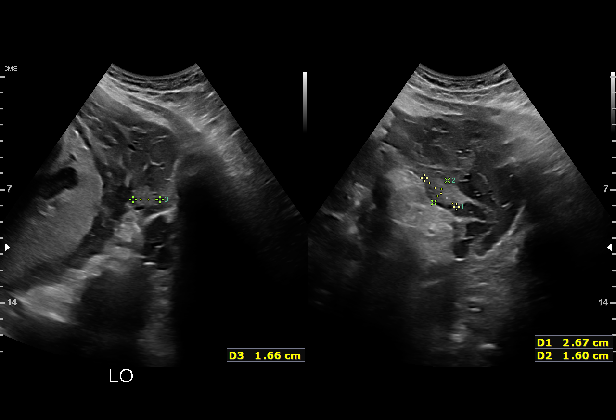
[im 18/48]
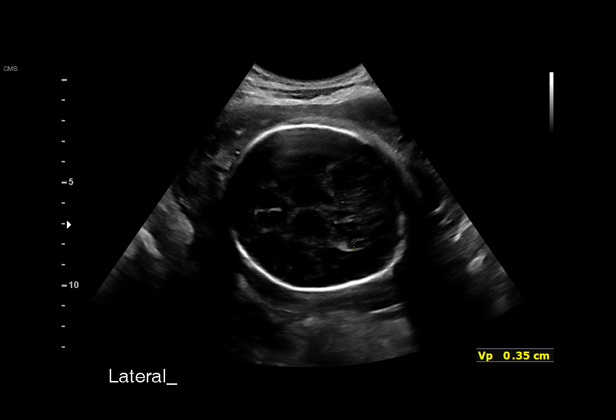
[im 21/48]
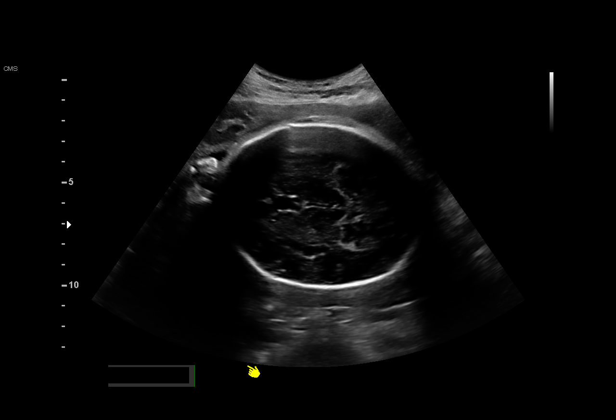
[im 27/48]
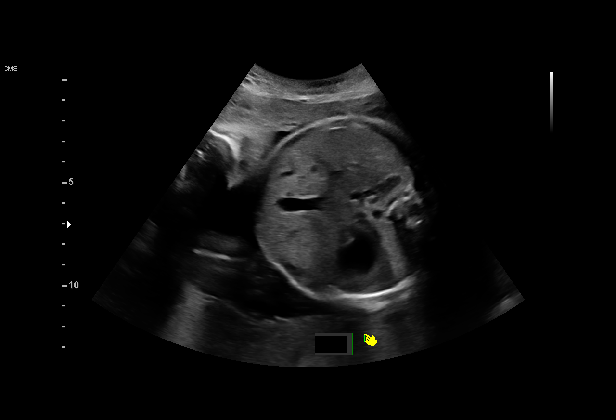
[im 30/48]
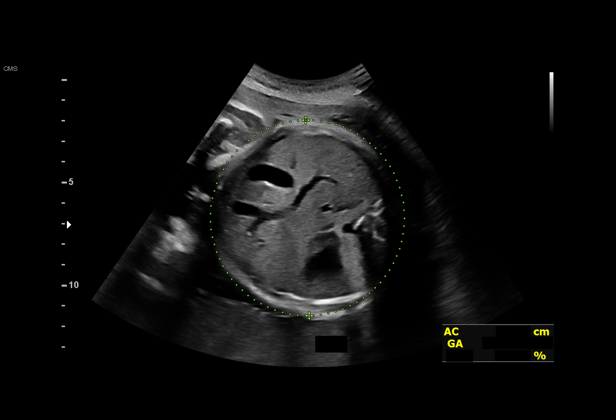
[im 34/48]
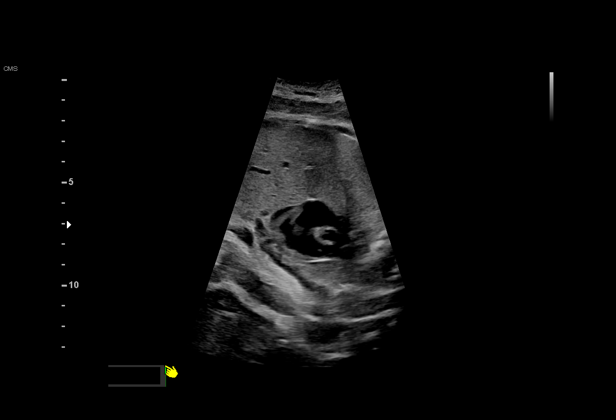
[im 39/48]
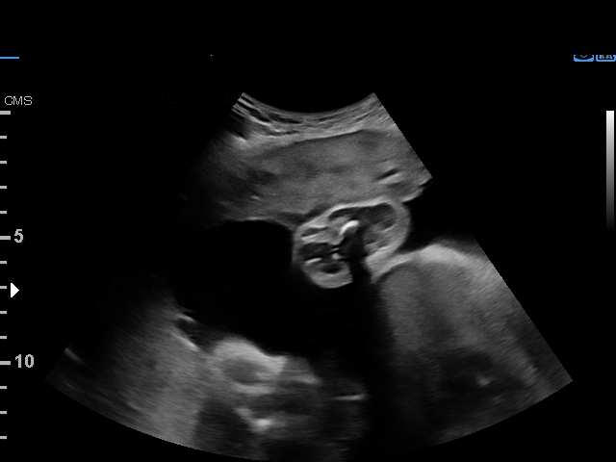
[im 42/48]
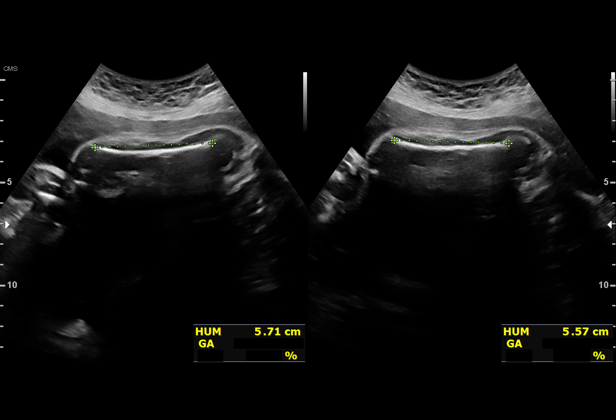
[im 46/48]
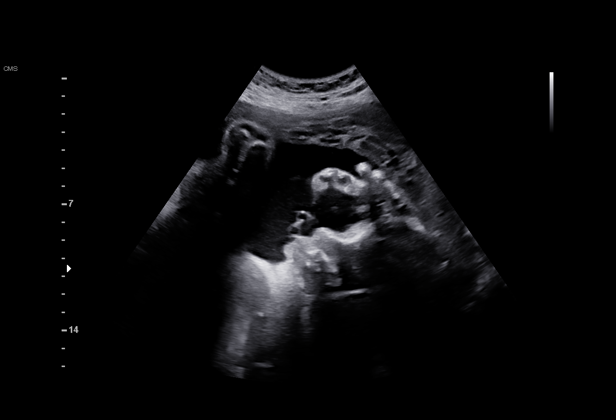

[12 of 28 positions shown; findings below may reference images not displayed]

Addendum:\.br----------------------------------------------------------------------
----------------------------------------------------------------------

----------------------------------------------------------------------

                                                            [REDACTED]care
----------------------------------------------------------------------

----------------------------------------------------------------------

----------------------------------------------------------------------
Indications

 Gestational diabetes in pregnancy, insulin     [N4]
 controlled
 Hypertension - Chronic/Pre-existing (ASA)      [N4]
 33 weeks gestation of pregnancy
 Obesity complicating pregnancy, third          [N4]
 trimester (BMI 32)
 Encounter for other antenatal screening        [N4]
 follow-up
 History of cesarean delivery, currently        [N4]
 pregnant (x3)
 Genetic carrier (Sickle Cell)                  [N4]
 Seizure disorder (Lamictal, Keppra)            [N4] [N4]
 Poor obstetric history: Previous fetal growth  [N4]
 restriction (FGR)
----------------------------------------------------------------------
Fetal Evaluation

 Num Of Fetuses:         1
 Fetal Heart Rate(bpm):  152
 Cardiac Activity:       Observed
 Presentation:           Cephalic

 Amniotic Fluid
 AFI FV:      Within normal limits

 AFI Sum(cm)     %Tile       Largest Pocket(cm)
 16.3            59
 RUQ(cm)       RLQ(cm)       LUQ(cm)        LLQ(cm)
 2.9           3
----------------------------------------------------------------------
Biophysical Evaluation

 Amniotic F.V:   Within normal limits       F. Tone:        Observed
 F. Movement:    Observed                   Score:          [DATE]
 F. Breathing:   Observed
----------------------------------------------------------------------
Biometry

 BPD:        79  mm     G. Age:  31w 5d          7  %    CI:        72.38   %    70 - 86
                                                         FL/HC:      21.2   %    19.9 -
 HC:      295.4  mm     G. Age:  32w 5d          5  %    HC/AC:      1.00        0.96 -
 AC:      295.8  mm     G. Age:  33w 4d         56  %    FL/BPD:     79.2   %    71 - 87
 FL:       62.6  mm     G. Age:  32w 3d         16  %    FL/AC:      21.2   %    20 - 24
 HUM:      55.9  mm     G. Age:  32w 4d         39  %

 LV:        3.5  mm

 Est. FW:    [N4]  gm    4 lb 10 oz      28  %
----------------------------------------------------------------------
OB History

 Gravidity:    7
 Living:       4
----------------------------------------------------------------------
Gestational Age

 LMP:           33w 5d        Date:  [DATE]                 EDD:   [DATE]
 U/S Today:     32w 4d                                        EDD:   [DATE]
 Best:          33w 3d     Det. By:  U/S C R L  ([DATE])    EDD:   [DATE]
----------------------------------------------------------------------
Anatomy

 Cranium:               Appears normal         Aortic Arch:            Previously seen
 Cavum:                 Previously seen        Ductal Arch:            Previously seen
 Ventricles:            Appears normal         Diaphragm:              Previously seen
 Choroid Plexus:        Previously seen        Stomach:                Appears normal, left
                                                                       sided
 Cerebellum:            Previously seen        Abdomen:                Previously seen
 Posterior Fossa:       Previously seen        Abdominal Wall:         Previously seen
 Nuchal Fold:           Previously seen        Cord Vessels:           Previously seen
 Face:                  Orbits and profile     Kidneys:                Appear normal
                        previously seen
 Lips:                  Previously seen        Bladder:                Appears normal
 Thoracic:              Previously seen        Spine:                  Previously seen
 Heart:                 Previously seen        Upper Extremities:      Previously seen
 RVOT:                  Appears normal         Lower Extremities:      Previously seen
 LVOT:                  Appears normal

 Other:  Male gender previously seen. Lenses, Nasal Bone, Heels and RIGHT
         open hands/5th digits visualized previously. VC, 3VV and 3VTV
         visualized previously .
----------------------------------------------------------------------
Impression

  Gestational diabetes. Patient takes insulin for control.
 Insulin was recently started and the patient reports her blood
 glucose levels are still not within normal range.
 antihypertensives.  Blood pressure today at her office is
 151/88 mmHg.  She does not have symptoms and signs of
 severe features of preeclampsia.
 Seizure disorder.  Patient has frequent focal seizures.  She
 had one this morning.  She takes Keppra.

 Amniotic fluid is normal and good fetal activity is seen .Fetal
 growth is appropriate for gestational age .Antenatal testing is
 reassuring. BPP [DATE].
----------------------------------------------------------------------
Recommendations

 -Continue weekly BPP till delivery.
 -Delivery should be considered at 37 weeks gestation.
 Inpatient management may be considered from 36 weeks
 gestation or earlier if patient has recurrent frequent seizures
----------------------------------------------------------------------
                      TOBON
----------------------------------------------------------------------

*** End of Addendum ***\.br----------------------------------------------------------------------
----------------------------------------------------------------------

----------------------------------------------------------------------

                                                            [REDACTED]care
----------------------------------------------------------------------

----------------------------------------------------------------------

----------------------------------------------------------------------
Indications

 Gestational diabetes in pregnancy, insulin     [N4]
 controlled
 Hypertension - Chronic/Pre-existing (ASA)      [N4]
 33 weeks gestation of pregnancy
 Obesity complicating pregnancy, third          [N4]
 trimester (BMI 32)
 Encounter for other antenatal screening        [N4]
 follow-up
 History of cesarean delivery, currently        [N4]
 pregnant (x3)
 Genetic carrier (Sickle Cell)                  [N4]
 Seizure disorder (Lamictal, Keppra)            [N4] [N4]
 Poor obstetric history: Previous fetal growth  [N4]
 restriction (FGR)
----------------------------------------------------------------------
Fetal Evaluation

 Num Of Fetuses:         1
 Fetal Heart Rate(bpm):  152
 Cardiac Activity:       Observed
 Presentation:           Cephalic

 Amniotic Fluid
 AFI FV:      Within normal limits

 AFI Sum(cm)     %Tile       Largest Pocket(cm)
 16.3            59
 RUQ(cm)       RLQ(cm)       LUQ(cm)        LLQ(cm)
 2.9           3
----------------------------------------------------------------------
Biophysical Evaluation

 Amniotic F.V:   Within normal limits       F. Tone:        Observed
 F. Movement:    Observed                   Score:          [DATE]
 F. Breathing:   Observed
----------------------------------------------------------------------
Biometry

 BPD:        79  mm     G. Age:  31w 5d          7  %    CI:        72.38   %    70 - 86
                                                         FL/HC:      21.2   %    19.9 -
 HC:      295.4  mm     G. Age:  32w 5d          5  %    HC/AC:      1.00        0.96 -
 AC:      295.8  mm     G. Age:  33w 4d         56  %    FL/BPD:     79.2   %    71 - 87
 FL:       62.6  mm     G. Age:  32w 3d         16  %    FL/AC:      21.2   %    20 - 24
 HUM:      55.9  mm     G. Age:  32w 4d         39  %

 LV:        3.5  mm

 Est. FW:    [N4]  gm    4 lb 10 oz      28  %
----------------------------------------------------------------------
OB History

 Gravidity:    7
 Living:       4
----------------------------------------------------------------------
Gestational Age

 LMP:           33w 5d        Date:  [DATE]                 EDD:   [DATE]
 U/S Today:     32w 4d                                        EDD:   [DATE]
 Best:          33w 3d     Det. By:  U/S C R L  ([DATE])    EDD:   [DATE]
----------------------------------------------------------------------
Anatomy

 Cranium:               Appears normal         Aortic Arch:            Previously seen
 Cavum:                 Previously seen        Ductal Arch:            Previously seen
 Ventricles:            Appears normal         Diaphragm:              Previously seen
 Choroid Plexus:        Previously seen        Stomach:                Appears normal, left
                                                                       sided
 Cerebellum:            Previously seen        Abdomen:                Previously seen
 Posterior Fossa:       Previously seen        Abdominal Wall:         Previously seen
 Nuchal Fold:           Previously seen        Cord Vessels:           Previously seen
 Face:                  Orbits and profile     Kidneys:                Appear normal
                        previously seen
 Lips:                  Previously seen        Bladder:                Appears normal
 Thoracic:              Previously seen        Spine:                  Previously seen
 Heart:                 Previously seen        Upper Extremities:      Previously seen
 RVOT:                  Appears normal         Lower Extremities:      Previously seen
 LVOT:                  Appears normal

 Other:  Male gender previously seen. Lenses, Nasal Bone, Heels and RIGHT
         open hands/5th digits visualized previously. VC, 3VV and 3VTV
         visualized previously .
----------------------------------------------------------------------
Impression

  Gestational diabetes. Patient takes insulin for control.
 Insulin was recently started and the patient reports her blood
 glucose levels are still not within normal range.
 antihypertensives.  Blood pressure today at her office is
 151/88 mmHg.  She does not have symptoms and signs of
 severe features of preeclampsia.
 Seizure disorder.  Patient has frequent focal seizures.  She
 had one this morning.  She takes Keppra.

 Amniotic fluid is normal and good fetal activity is seen .Fetal
 growth is appropriate for gestational age .Antenatal testing is
 reassuring. BPP [DATE].
----------------------------------------------------------------------
Recommendations

 -Continue weekly BPP till delivery.
 -Delivery should be considered at 37 weeks gestation.
 Inpatient management may be considered from 36 weeks
 gestation or earlier if patient has recurrent frequent seizures
----------------------------------------------------------------------
                 TOBON
----------------------------------------------------------------------

## 2020-09-22 ENCOUNTER — Other Ambulatory Visit: Payer: Self-pay | Admitting: *Deleted

## 2020-09-22 DIAGNOSIS — O10913 Unspecified pre-existing hypertension complicating pregnancy, third trimester: Secondary | ICD-10-CM

## 2020-09-23 ENCOUNTER — Other Ambulatory Visit: Payer: Self-pay

## 2020-09-23 ENCOUNTER — Ambulatory Visit (INDEPENDENT_AMBULATORY_CARE_PROVIDER_SITE_OTHER): Payer: No Typology Code available for payment source | Admitting: Obstetrics and Gynecology

## 2020-09-23 VITALS — BP 133/89 | HR 105 | Wt 220.0 lb

## 2020-09-23 DIAGNOSIS — O99353 Diseases of the nervous system complicating pregnancy, third trimester: Secondary | ICD-10-CM

## 2020-09-23 DIAGNOSIS — O24414 Gestational diabetes mellitus in pregnancy, insulin controlled: Secondary | ICD-10-CM

## 2020-09-23 DIAGNOSIS — G40909 Epilepsy, unspecified, not intractable, without status epilepticus: Secondary | ICD-10-CM

## 2020-09-23 DIAGNOSIS — O219 Vomiting of pregnancy, unspecified: Secondary | ICD-10-CM

## 2020-09-23 DIAGNOSIS — Z98891 History of uterine scar from previous surgery: Secondary | ICD-10-CM

## 2020-09-23 DIAGNOSIS — Z3A34 34 weeks gestation of pregnancy: Secondary | ICD-10-CM

## 2020-09-23 DIAGNOSIS — O0990 Supervision of high risk pregnancy, unspecified, unspecified trimester: Secondary | ICD-10-CM
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
## 2020-09-28 ENCOUNTER — Inpatient Hospital Stay (HOSPITAL_COMMUNITY): Payer: No Typology Code available for payment source | Admitting: Anesthesiology

## 2020-09-28 ENCOUNTER — Other Ambulatory Visit: Payer: Self-pay

## 2020-09-28 ENCOUNTER — Inpatient Hospital Stay (HOSPITAL_COMMUNITY)
Admission: AD | Admit: 2020-09-28 | Payer: No Typology Code available for payment source | Source: Home / Self Care | Admitting: Family Medicine

## 2020-09-28 ENCOUNTER — Encounter (HOSPITAL_COMMUNITY)
Admission: AD | Disposition: A | Discharge status: Home / Self Care | Payer: Self-pay | Source: Home / Self Care | Attending: Obstetrics & Gynecology

## 2020-09-28 ENCOUNTER — Encounter (HOSPITAL_COMMUNITY): Payer: Self-pay | Admitting: Family Medicine

## 2020-09-28 ENCOUNTER — Ambulatory Visit (HOSPITAL_BASED_OUTPATIENT_CLINIC_OR_DEPARTMENT_OTHER): Payer: No Typology Code available for payment source

## 2020-09-28 ENCOUNTER — Ambulatory Visit: Payer: No Typology Code available for payment source | Admitting: *Deleted

## 2020-09-28 ENCOUNTER — Inpatient Hospital Stay (HOSPITAL_COMMUNITY)
Admission: AD | Admit: 2020-09-28 | Discharge: 2020-10-02 | DRG: 787 | Disposition: A | Discharge status: Home / Self Care | Payer: No Typology Code available for payment source | Attending: Obstetrics & Gynecology | Admitting: Obstetrics & Gynecology

## 2020-09-28 VITALS — BP 147/78 | HR 98

## 2020-09-28 DIAGNOSIS — D75A Glucose-6-phosphate dehydrogenase (G6PD) deficiency without anemia: Secondary | ICD-10-CM | POA: Diagnosis present

## 2020-09-28 DIAGNOSIS — Z20822 Contact with and (suspected) exposure to covid-19: Secondary | ICD-10-CM | POA: Diagnosis present

## 2020-09-28 DIAGNOSIS — O10919 Unspecified pre-existing hypertension complicating pregnancy, unspecified trimester: Secondary | ICD-10-CM

## 2020-09-28 DIAGNOSIS — O9902 Anemia complicating childbirth: Secondary | ICD-10-CM | POA: Diagnosis present

## 2020-09-28 DIAGNOSIS — Z3A27 27 weeks gestation of pregnancy: Secondary | ICD-10-CM

## 2020-09-28 DIAGNOSIS — O99344 Other mental disorders complicating childbirth: Secondary | ICD-10-CM | POA: Diagnosis present

## 2020-09-28 DIAGNOSIS — O114 Pre-existing hypertension with pre-eclampsia, complicating childbirth: Secondary | ICD-10-CM | POA: Diagnosis present

## 2020-09-28 DIAGNOSIS — O0990 Supervision of high risk pregnancy, unspecified, unspecified trimester: Secondary | ICD-10-CM | POA: Insufficient documentation

## 2020-09-28 DIAGNOSIS — G40909 Epilepsy, unspecified, not intractable, without status epilepticus: Secondary | ICD-10-CM | POA: Diagnosis present

## 2020-09-28 DIAGNOSIS — O9912 Other diseases of the blood and blood-forming organs and certain disorders involving the immune mechanism complicating childbirth: Secondary | ICD-10-CM | POA: Diagnosis present

## 2020-09-28 DIAGNOSIS — E669 Obesity, unspecified: Secondary | ICD-10-CM | POA: Diagnosis not present

## 2020-09-28 DIAGNOSIS — O24414 Gestational diabetes mellitus in pregnancy, insulin controlled: Secondary | ICD-10-CM

## 2020-09-28 DIAGNOSIS — R519 Headache, unspecified: Secondary | ICD-10-CM | POA: Diagnosis present

## 2020-09-28 DIAGNOSIS — O99354 Diseases of the nervous system complicating childbirth: Secondary | ICD-10-CM | POA: Diagnosis present

## 2020-09-28 DIAGNOSIS — O34219 Maternal care for unspecified type scar from previous cesarean delivery: Secondary | ICD-10-CM

## 2020-09-28 DIAGNOSIS — R531 Weakness: Secondary | ICD-10-CM | POA: Diagnosis not present

## 2020-09-28 DIAGNOSIS — Z148 Genetic carrier of other disease: Secondary | ICD-10-CM

## 2020-09-28 DIAGNOSIS — O1413 Severe pre-eclampsia, third trimester: Secondary | ICD-10-CM

## 2020-09-28 DIAGNOSIS — O1002 Pre-existing essential hypertension complicating childbirth: Secondary | ICD-10-CM | POA: Diagnosis present

## 2020-09-28 DIAGNOSIS — Z87891 Personal history of nicotine dependence: Secondary | ICD-10-CM | POA: Diagnosis not present

## 2020-09-28 DIAGNOSIS — F419 Anxiety disorder, unspecified: Secondary | ICD-10-CM | POA: Diagnosis present

## 2020-09-28 DIAGNOSIS — O10913 Unspecified pre-existing hypertension complicating pregnancy, third trimester: Secondary | ICD-10-CM | POA: Insufficient documentation

## 2020-09-28 DIAGNOSIS — O99353 Diseases of the nervous system complicating pregnancy, third trimester: Secondary | ICD-10-CM

## 2020-09-28 DIAGNOSIS — K9189 Other postprocedural complications and disorders of digestive system: Secondary | ICD-10-CM | POA: Diagnosis not present

## 2020-09-28 DIAGNOSIS — O24424 Gestational diabetes mellitus in childbirth, insulin controlled: Secondary | ICD-10-CM | POA: Diagnosis present

## 2020-09-28 DIAGNOSIS — D573 Sickle-cell trait: Secondary | ICD-10-CM | POA: Diagnosis present

## 2020-09-28 DIAGNOSIS — Z3A34 34 weeks gestation of pregnancy: Secondary | ICD-10-CM

## 2020-09-28 DIAGNOSIS — O99893 Other specified diseases and conditions complicating puerperium: Secondary | ICD-10-CM | POA: Diagnosis not present

## 2020-09-28 DIAGNOSIS — O34211 Maternal care for low transverse scar from previous cesarean delivery: Secondary | ICD-10-CM | POA: Diagnosis present

## 2020-09-28 DIAGNOSIS — O09293 Supervision of pregnancy with other poor reproductive or obstetric history, third trimester: Secondary | ICD-10-CM

## 2020-09-28 DIAGNOSIS — O10013 Pre-existing essential hypertension complicating pregnancy, third trimester: Secondary | ICD-10-CM | POA: Diagnosis not present

## 2020-09-28 DIAGNOSIS — O1414 Severe pre-eclampsia complicating childbirth: Secondary | ICD-10-CM | POA: Diagnosis not present

## 2020-09-28 DIAGNOSIS — O99213 Obesity complicating pregnancy, third trimester: Secondary | ICD-10-CM | POA: Diagnosis not present

## 2020-09-28 LAB — RESP PANEL BY RT-PCR (FLU A&B, COVID) ARPGX2
Influenza A by PCR: NEGATIVE
Influenza B by PCR: NEGATIVE
SARS Coronavirus 2 by RT PCR: NEGATIVE

## 2020-09-28 LAB — COMPREHENSIVE METABOLIC PANEL
ALT: 14 U/L (ref 0–44)
AST: 13 U/L — ABNORMAL LOW (ref 15–41)
Albumin: 2.6 g/dL — ABNORMAL LOW (ref 3.5–5.0)
Alkaline Phosphatase: 91 U/L (ref 38–126)
Anion gap: 8 (ref 5–15)
BUN: 8 mg/dL (ref 6–20)
CO2: 21 mmol/L — ABNORMAL LOW (ref 22–32)
Calcium: 8.7 mg/dL — ABNORMAL LOW (ref 8.9–10.3)
Chloride: 105 mmol/L (ref 98–111)
Creatinine, Ser: 0.76 mg/dL (ref 0.44–1.00)
GFR, Estimated: >60 mL/min (ref >60)
Glucose, Bld: 88 mg/dL (ref 70–99)
Potassium: 3.5 mmol/L (ref 3.5–5.1)
Sodium: 134 mmol/L — ABNORMAL LOW (ref 135–145)
Total Bilirubin: 0.1 mg/dL — ABNORMAL LOW (ref 0.3–1.2)
Total Protein: 6 g/dL — ABNORMAL LOW (ref 6.5–8.1)

## 2020-09-28 LAB — URINALYSIS, ROUTINE W REFLEX MICROSCOPIC
Bilirubin Urine: NEGATIVE
Glucose, UA: NEGATIVE mg/dL
Hgb urine dipstick: NEGATIVE
Ketones, ur: NEGATIVE mg/dL
Nitrite: NEGATIVE
Protein, ur: NEGATIVE mg/dL
Specific Gravity, Urine: 1.01 (ref 1.005–1.030)
pH: 6 (ref 5.0–8.0)

## 2020-09-28 LAB — GLUCOSE, CAPILLARY
Glucose-Capillary: 58 mg/dL — ABNORMAL LOW (ref 70–99)
Glucose-Capillary: 84 mg/dL (ref 70–99)
Glucose-Capillary: 57 mg/dL — ABNORMAL LOW (ref 70–99)
Glucose-Capillary: 121 mg/dL — ABNORMAL HIGH (ref 70–99)

## 2020-09-28 LAB — CBC
HCT: 29.5 % — ABNORMAL LOW (ref 36.0–46.0)
HCT: 28.5 % — ABNORMAL LOW (ref 36.0–46.0)
Hemoglobin: 10.1 g/dL — ABNORMAL LOW (ref 12.0–15.0)
Hemoglobin: 9.7 g/dL — ABNORMAL LOW (ref 12.0–15.0)
MCH: 30.8 pg (ref 26.0–34.0)
MCH: 30.7 pg (ref 26.0–34.0)
MCHC: 34.2 g/dL (ref 30.0–36.0)
MCHC: 34 g/dL (ref 30.0–36.0)
MCV: 89.9 fL (ref 80.0–100.0)
MCV: 90.2 fL (ref 80.0–100.0)
Platelets: 161 10*3/uL (ref 150–400)
Platelets: 146 10*3/uL — ABNORMAL LOW (ref 150–400)
RBC: 3.28 MIL/uL — ABNORMAL LOW (ref 3.87–5.11)
RBC: 3.16 MIL/uL — ABNORMAL LOW (ref 3.87–5.11)
RDW: 12.8 % (ref 11.5–15.5)
RDW: 12.8 % (ref 11.5–15.5)
WBC: 8.9 10*3/uL (ref 4.0–10.5)
WBC: 13.1 10*3/uL — ABNORMAL HIGH (ref 4.0–10.5)
nRBC: 0.2 % (ref 0.0–0.2)
nRBC: 0.2 % (ref 0.0–0.2)

## 2020-09-28 LAB — PROTEIN / CREATININE RATIO, URINE
Creatinine, Urine: 56.46 mg/dL
Protein Creatinine Ratio: 0.18 mg/mg{Cre} — ABNORMAL HIGH (ref 0.00–0.15)
Total Protein, Urine: 10 mg/dL

## 2020-09-28 LAB — PREPARE RBC (CROSSMATCH)

## 2020-09-28 IMAGING — US US MFM FETAL BPP W/O NON-STRESS
1 series · 15 of 28 positions shown · non-contrast
Comparison: none

[Series 1: us mfm fetal bpp w/o non-stress · 44 acquisitions, 15 frames shown]
[im 1/44]
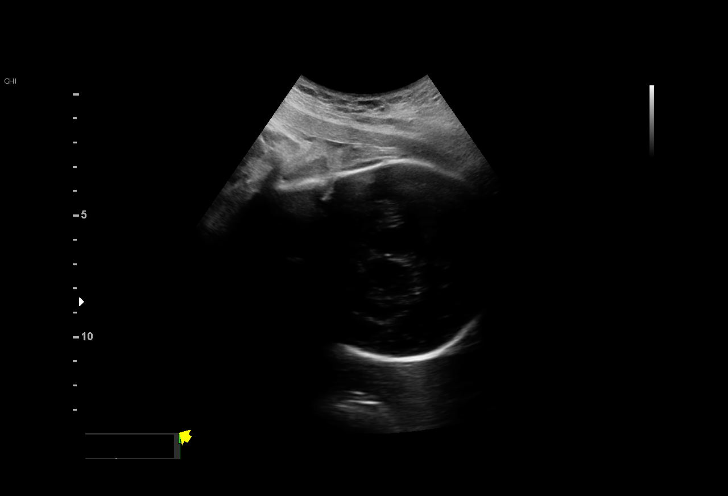
[im 4/44]
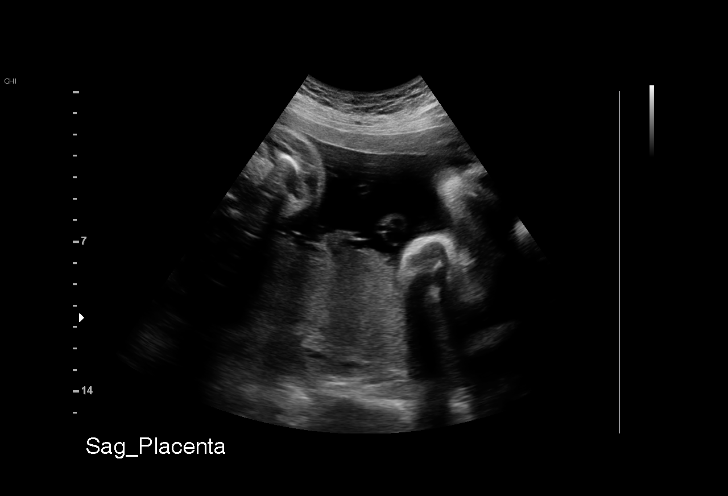
[im 7/44]
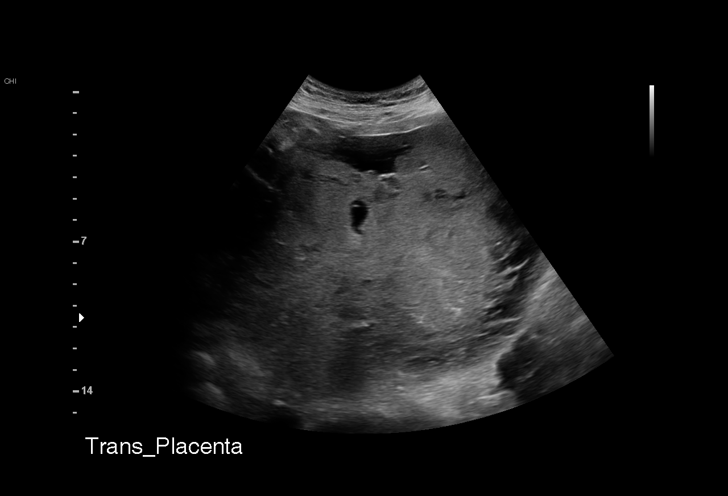
[im 10/44]
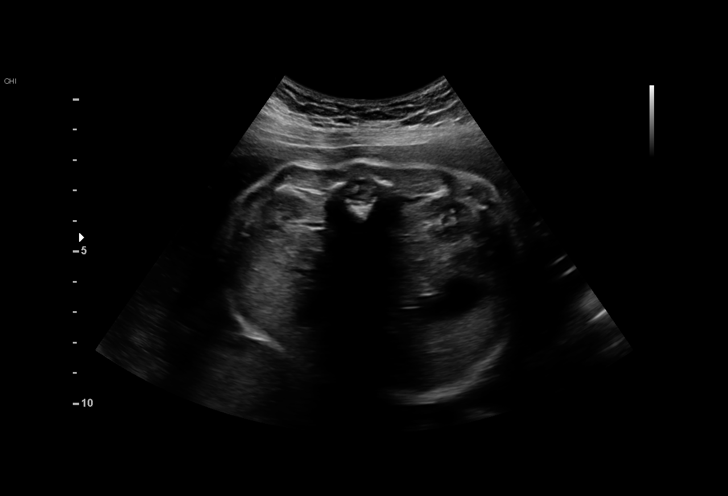
[im 13/44]
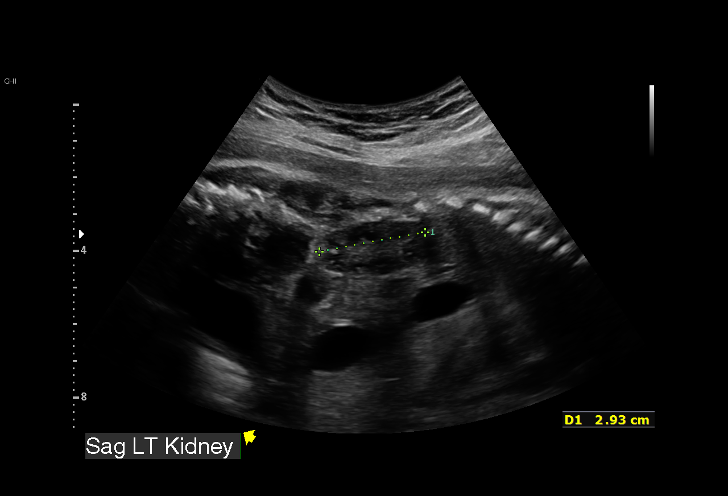
[im 16/44]
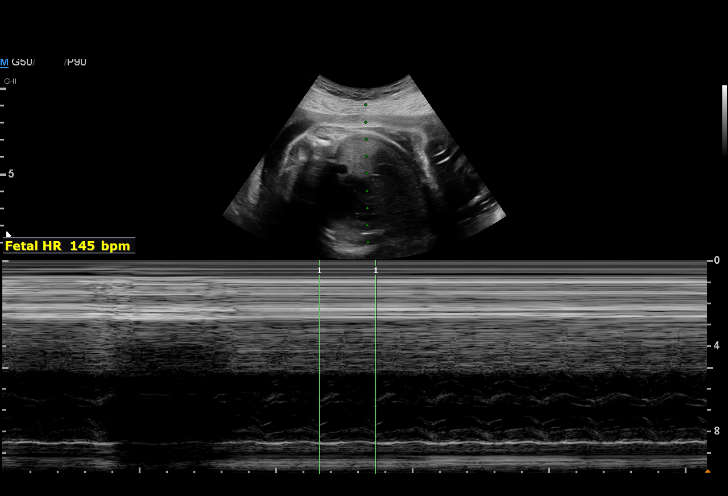
[im 20/44]
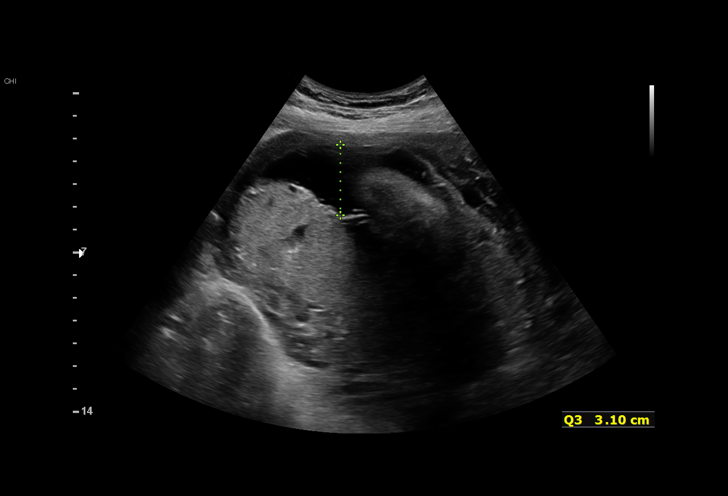
[im 23/44]
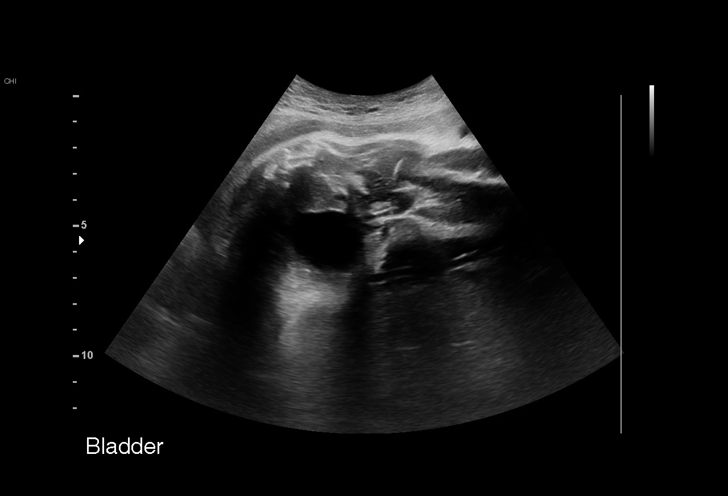
[im 24/44]
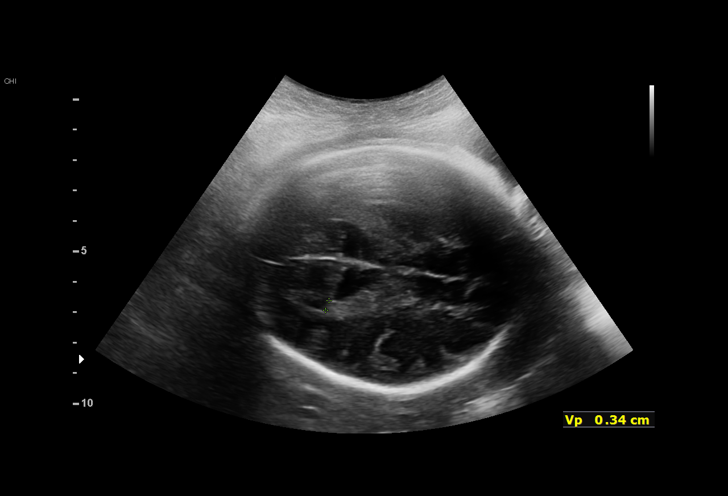
[im 28/44]
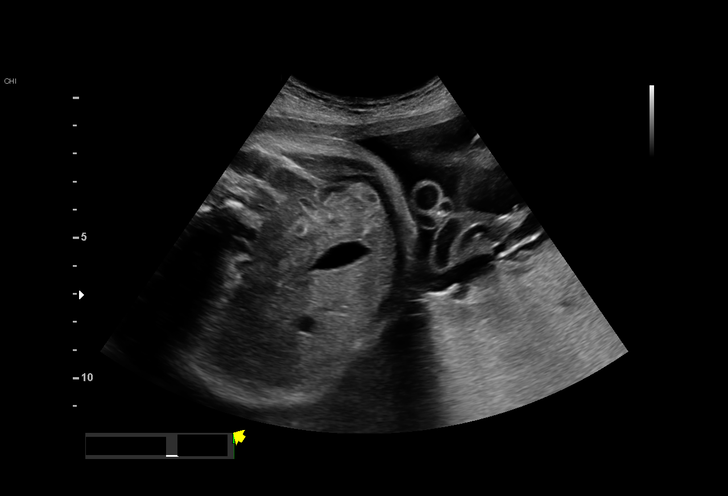
[im 31/44]
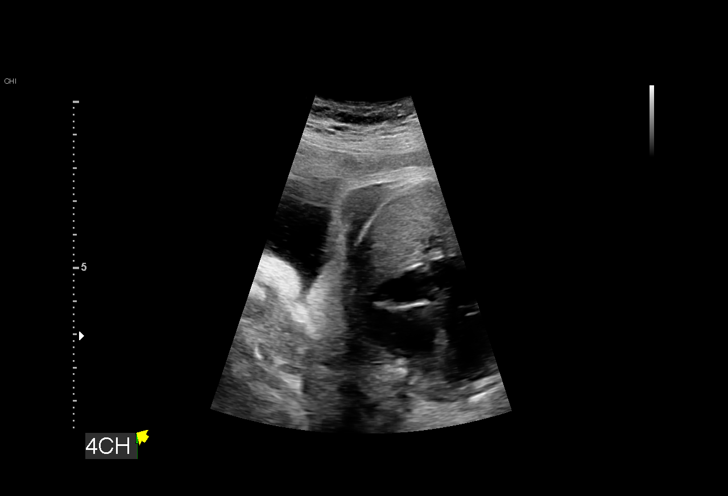
[im 34/44]
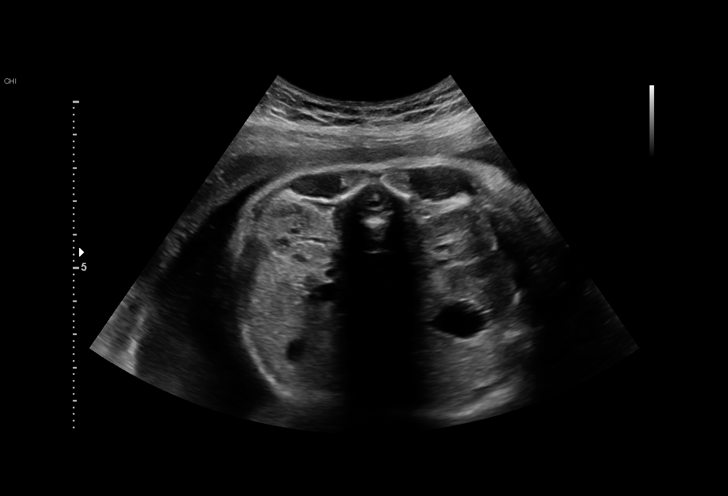
[im 37/44]
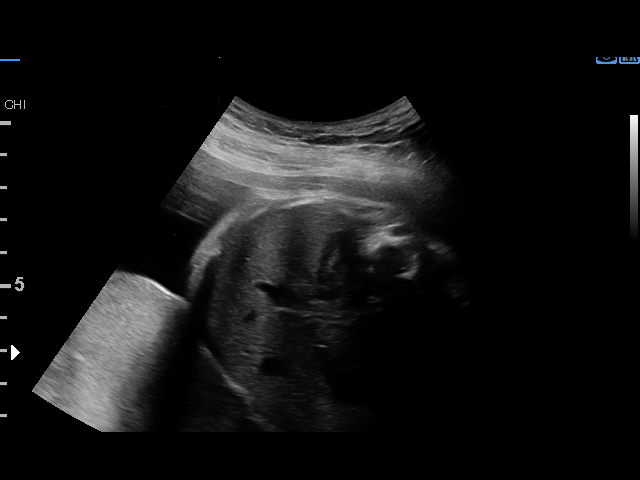
[im 40/44]
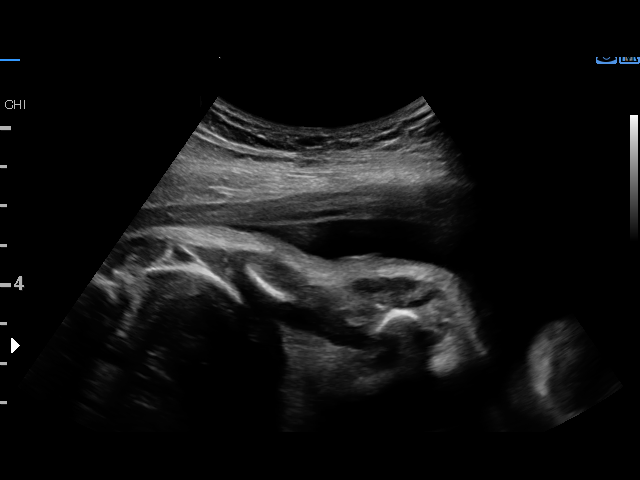
[im 44/44]
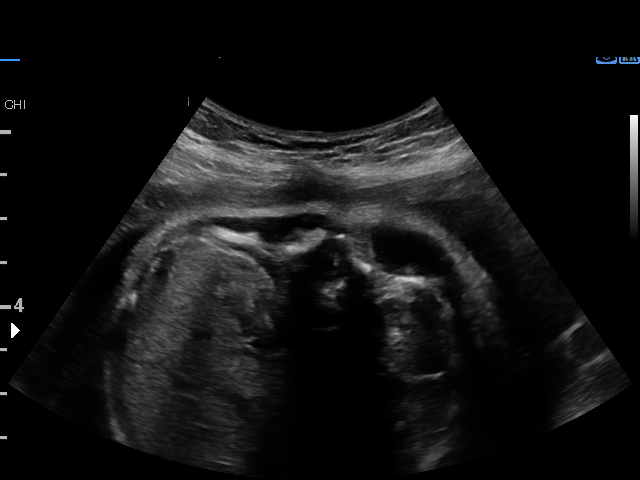

[15 of 28 positions shown; findings below may reference images not displayed]

[REDACTED]care

Indications

 Gestational diabetes in pregnancy, insulin     [NF]
 controlled
 Hypertension - Chronic/Pre-existing (ASA)      [NF]
 Obesity complicating pregnancy, third          [NF]
 trimester (BMI 32)
 History of cesarean delivery, currently        [NF]
 pregnant (x3)
 Genetic carrier (Sickle Cell)                  [NF]
 Seizure disorder (Lamictal, Keppra)            [NF] [NF]
 Poor obstetric history: Previous fetal growth  [NF]
 restriction (FGR)
 34 weeks gestation of pregnancy
Fetal Evaluation

 Num Of Fetuses:         1
 Fetal Heart Rate(bpm):  145
 Cardiac Activity:       Observed
 Presentation:           Cephalic
 Placenta:               Posterior Fundal
 P. Cord Insertion:      Previously Visualized

 Amniotic Fluid
 AFI FV:      Within normal limits

 AFI Sum(cm)     %Tile       Largest Pocket(cm)
 17.03           62
 RUQ(cm)       RLQ(cm)       LUQ(cm)        LLQ(cm)

Biophysical Evaluation

 Amniotic F.V:   Pocket => 2 cm             F. Tone:        Observed
 F. Movement:    Observed                   Score:          [DATE]
 F. Breathing:   Observed
Biometry

 LV:        3.4  mm
OB History

 Gravidity:    7
 Living:       4
Gestational Age

 LMP:           34w 5d        Date:  [DATE]                 EDD:   [DATE]
 Best:          34w 3d     Det. By:  U/S C R L  ([DATE])    EDD:   [DATE]
Anatomy

 Ventricles:            Appears normal         Kidneys:                Appear normal
 Heart:                 Appears normal         Bladder:                Appears normal
                        (4CH, axis, and
                        situs)
 Stomach:               Appears normal, left
                        sided
Cervix Uterus Adnexa

 Cervix
 Not visualized (advanced GA >[NF])
Impression

 Antenatal testing performed given maternal [NF] and
 chronic hypertension with focal seizures.
 The biophysical profile was [DATE] with good fetal movement and
 amniotic fluid volume.

 Ms. MALIQUE reports that she has had a persistent headache.
 Today's blood pressure was 136/83 and 147/78 mmHg. Her
 most recent labs were at the end [DATE].

 She also has chronic Nausea with a PICC line.  I
 recommended that she go to the [facility] for further evaluation.

 I discussed with Dr. MALIQUE who is aware.
Recommendations

 Continue weekly testing.

## 2020-09-28 SURGERY — Surgical Case
Anesthesia: Spinal

## 2020-09-28 MED ORDER — MORPHINE SULFATE (PF) 0.5 MG/ML IJ SOLN
INTRAMUSCULAR | Status: AC
  Filled 2020-09-28: qty 10

## 2020-09-28 MED ORDER — MORPHINE SULFATE (PF) 0.5 MG/ML IJ SOLN
INTRAMUSCULAR | Status: DC | PRN
  Administered 2020-09-28: .15 mg via INTRATHECAL

## 2020-09-28 MED ORDER — NALOXONE HCL 0.4 MG/ML IJ SOLN: 0.4000 mg | INTRAMUSCULAR | Status: DC | PRN

## 2020-09-28 MED ORDER — SODIUM CHLORIDE 0.9 % IV SOLN: 25.0000 mg | Freq: Four times a day (QID) | INTRAVENOUS | Status: DC | PRN

## 2020-09-28 MED ORDER — LACTATED RINGERS IV SOLN: INTRAVENOUS | Status: DC | PRN

## 2020-09-28 MED ORDER — DIPHENHYDRAMINE HCL 25 MG PO CAPS: 25.0000 mg | ORAL_CAPSULE | ORAL | Status: DC | PRN

## 2020-09-28 MED ORDER — FENTANYL CITRATE (PF) 100 MCG/2ML IJ SOLN
25.0000 ug | INTRAMUSCULAR | Status: DC | PRN
  Administered 2020-09-28: 25 ug via INTRAVENOUS

## 2020-09-28 MED ORDER — SODIUM CHLORIDE 0.9 % IR SOLN
Status: DC | PRN
  Administered 2020-09-28: 1

## 2020-09-28 MED ORDER — LACTATED RINGERS IV SOLN: INTRAVENOUS | Status: DC

## 2020-09-28 MED ORDER — ACETAMINOPHEN 10 MG/ML IV SOLN
INTRAVENOUS | Status: AC
  Filled 2020-09-28: qty 100

## 2020-09-28 MED ORDER — NALBUPHINE HCL 10 MG/ML IJ SOLN: 5.0000 mg | Freq: Once | INTRAMUSCULAR | Status: DC | PRN

## 2020-09-28 MED ORDER — PRENATAL MULTIVITAMIN CH
1.0000 | ORAL_TABLET | Freq: Every day | ORAL | Status: DC
  Administered 2020-09-29 – 2020-10-01 (×3): 1 via ORAL
  Filled 2020-09-28 (×3): qty 1

## 2020-09-28 MED ORDER — ACETAMINOPHEN 10 MG/ML IV SOLN
1000.0000 mg | Freq: Once | INTRAVENOUS | Status: DC | PRN
Start: 2020-09-28 — End: 2020-09-28
  Administered 2020-09-28: 1000 mg via INTRAVENOUS

## 2020-09-28 MED ORDER — SODIUM CHLORIDE 0.9 % IV SOLN: INTRAVENOUS | Status: DC | PRN

## 2020-09-28 MED ORDER — TETANUS-DIPHTH-ACELL PERTUSSIS 5-2.5-18.5 LF-MCG/0.5 IM SUSY: 0.5000 mL | PREFILLED_SYRINGE | Freq: Once | INTRAMUSCULAR | Status: DC

## 2020-09-28 MED ORDER — ENOXAPARIN SODIUM 60 MG/0.6ML IJ SOSY
50.0000 mg | PREFILLED_SYRINGE | INTRAMUSCULAR | Status: DC
  Administered 2020-09-29 – 2020-10-02 (×4): 50 mg via SUBCUTANEOUS
  Filled 2020-09-28 (×4): qty 0.6

## 2020-09-28 MED ORDER — LEVETIRACETAM 500 MG PO TABS
500.0000 mg | ORAL_TABLET | ORAL | Status: DC
  Filled 2020-09-28 (×2): qty 1

## 2020-09-28 MED ORDER — NALBUPHINE HCL 10 MG/ML IJ SOLN: 5.0000 mg | INTRAMUSCULAR | Status: DC | PRN

## 2020-09-28 MED ORDER — SODIUM CHLORIDE 0.9 % IV SOLN
25.0000 mg | Freq: Four times a day (QID) | INTRAVENOUS | Status: DC | PRN
  Administered 2020-09-28: 25 mg via INTRAVENOUS
  Filled 2020-09-28: qty 1

## 2020-09-28 MED ORDER — LEVETIRACETAM ER 500 MG PO TB24: 500.0000 mg | ORAL_TABLET | Freq: Every day | ORAL | Status: DC

## 2020-09-28 MED ORDER — MEASLES, MUMPS & RUBELLA VAC IJ SOLR: 0.5000 mL | Freq: Once | INTRAMUSCULAR | Status: DC

## 2020-09-28 MED ORDER — ZOLPIDEM TARTRATE 5 MG PO TABS
5.0000 mg | ORAL_TABLET | Freq: Every evening | ORAL | Status: DC | PRN
  Administered 2020-09-28 – 2020-10-02 (×2): 5 mg via ORAL
  Filled 2020-09-28 (×2): qty 1

## 2020-09-28 MED ORDER — HYDROXYZINE HCL 25 MG PO TABS
25.0000 mg | ORAL_TABLET | Freq: Once | ORAL | Status: AC
  Administered 2020-09-28: 25 mg via ORAL
  Filled 2020-09-28: qty 1

## 2020-09-28 MED ORDER — MAGNESIUM HYDROXIDE 400 MG/5ML PO SUSP: 30.0000 mL | ORAL | Status: DC | PRN

## 2020-09-28 MED ORDER — PHENYLEPHRINE HCL (PRESSORS) 10 MG/ML IV SOLN
INTRAVENOUS | Status: DC | PRN
  Administered 2020-09-28: 160 ug via INTRAVENOUS

## 2020-09-28 MED ORDER — ESCITALOPRAM OXALATE 10 MG PO TABS
10.0000 mg | ORAL_TABLET | Freq: Every day | ORAL | Status: DC
  Administered 2020-09-28 – 2020-10-02 (×5): 10 mg via ORAL
  Filled 2020-09-28 (×5): qty 1

## 2020-09-28 MED ORDER — DEXTROSE 50 % IV SOLN
50.0000 mL | Freq: Once | INTRAVENOUS | Status: AC
  Administered 2020-09-28: 50 mL via INTRAVENOUS

## 2020-09-28 MED ORDER — NALBUPHINE HCL 10 MG/ML IJ SOLN
5.0000 mg | Freq: Once | INTRAMUSCULAR | Status: DC | PRN
Start: 2020-09-28 — End: 2020-10-02

## 2020-09-28 MED ORDER — FENTANYL CITRATE (PF) 100 MCG/2ML IJ SOLN
INTRAMUSCULAR | Status: AC
  Filled 2020-09-28: qty 2

## 2020-09-28 MED ORDER — MAGNESIUM SULFATE 40 GM/1000ML IV SOLN
2.0000 g/h | INTRAVENOUS | Status: AC
  Administered 2020-09-28 – 2020-09-29 (×2): 2 g/h via INTRAVENOUS
  Filled 2020-09-28 (×2): qty 1000

## 2020-09-28 MED ORDER — MENTHOL 3 MG MT LOZG: 1.0000 | LOZENGE | OROMUCOSAL | Status: DC | PRN

## 2020-09-28 MED ORDER — DIPHENHYDRAMINE HCL 25 MG PO CAPS: 25.0000 mg | ORAL_CAPSULE | Freq: Four times a day (QID) | ORAL | Status: DC | PRN

## 2020-09-28 MED ORDER — ACETAMINOPHEN 500 MG PO TABS
1000.0000 mg | ORAL_TABLET | Freq: Four times a day (QID) | ORAL | Status: DC
  Administered 2020-09-29 – 2020-10-02 (×13): 1000 mg via ORAL
  Filled 2020-09-28 (×14): qty 2

## 2020-09-28 MED ORDER — SCOPOLAMINE 1 MG/3DAYS TD PT72
MEDICATED_PATCH | TRANSDERMAL | Status: AC
  Filled 2020-09-28: qty 1

## 2020-09-28 MED ORDER — OXYTOCIN-SODIUM CHLORIDE 30-0.9 UT/500ML-% IV SOLN: 2.5000 [IU]/h | INTRAVENOUS | Status: AC

## 2020-09-28 MED ORDER — OXYTOCIN-SODIUM CHLORIDE 30-0.9 UT/500ML-% IV SOLN
INTRAVENOUS | Status: DC | PRN
  Administered 2020-09-28: 30 [IU] via INTRAVENOUS

## 2020-09-28 MED ORDER — ONDANSETRON HCL 4 MG/2ML IJ SOLN
INTRAMUSCULAR | Status: AC
  Filled 2020-09-28: qty 2

## 2020-09-28 MED ORDER — DEXTROSE 50 % IV SOLN
1.0000 | Freq: Once | INTRAVENOUS | Status: AC
  Administered 2020-09-28: 50 mL via INTRAVENOUS
  Filled 2020-09-28: qty 50

## 2020-09-28 MED ORDER — GABAPENTIN 100 MG PO CAPS
100.0000 mg | ORAL_CAPSULE | Freq: Two times a day (BID) | ORAL | Status: DC
  Administered 2020-09-28 – 2020-10-02 (×8): 100 mg via ORAL
  Filled 2020-09-28 (×8): qty 1

## 2020-09-28 MED ORDER — DIPHENHYDRAMINE HCL 50 MG/ML IJ SOLN
12.5000 mg | INTRAMUSCULAR | Status: DC | PRN
  Administered 2020-09-29: 12.5 mg via INTRAVENOUS
  Filled 2020-09-28: qty 1

## 2020-09-28 MED ORDER — BUPIVACAINE IN DEXTROSE 0.75-8.25 % IT SOLN
INTRATHECAL | Status: DC | PRN
  Administered 2020-09-28: 2 mL via INTRATHECAL

## 2020-09-28 MED ORDER — OXYTOCIN-SODIUM CHLORIDE 30-0.9 UT/500ML-% IV SOLN
INTRAVENOUS | Status: AC
  Filled 2020-09-28: qty 500

## 2020-09-28 MED ORDER — MEPERIDINE HCL 25 MG/ML IJ SOLN: 6.2500 mg | INTRAMUSCULAR | Status: DC | PRN

## 2020-09-28 MED ORDER — SODIUM CHLORIDE 0.9% FLUSH
3.0000 mL | INTRAVENOUS | Status: DC | PRN
  Administered 2020-09-30: 3 mL via INTRAVENOUS

## 2020-09-28 MED ORDER — PHENYLEPHRINE HCL-NACL 20-0.9 MG/250ML-% IV SOLN
INTRAVENOUS | Status: DC | PRN
  Administered 2020-09-28: 80 ug/min via INTRAVENOUS

## 2020-09-28 MED ORDER — INSULIN GLARGINE-YFGN 100 UNIT/ML ~~LOC~~ SOLN
20.0000 [IU] | Freq: Every day | SUBCUTANEOUS | Status: DC
  Administered 2020-09-28: 20 [IU] via SUBCUTANEOUS
  Filled 2020-09-28 (×2): qty 0.2

## 2020-09-28 MED ORDER — COCONUT OIL OIL: 1.0000 "application " | TOPICAL_OIL | Status: DC | PRN

## 2020-09-28 MED ORDER — BUTALBITAL-APAP-CAFFEINE 50-325-40 MG PO TABS
1.0000 | ORAL_TABLET | Freq: Once | ORAL | Status: AC
  Administered 2020-09-28: 1 via ORAL
  Filled 2020-09-28: qty 1

## 2020-09-28 MED ORDER — SOD CITRATE-CITRIC ACID 500-334 MG/5ML PO SOLN
30.0000 mL | Freq: Once | ORAL | Status: AC
  Administered 2020-09-28: 30 mL via ORAL
  Filled 2020-09-28: qty 30

## 2020-09-28 MED ORDER — LEVETIRACETAM IN NACL 500 MG/100ML IV SOLN
500.0000 mg | Freq: Once | INTRAVENOUS | Status: AC
  Administered 2020-09-28: 500 mg via INTRAVENOUS
  Filled 2020-09-28: qty 100

## 2020-09-28 MED ORDER — WITCH HAZEL-GLYCERIN EX PADS: 1.0000 "application " | MEDICATED_PAD | CUTANEOUS | Status: DC | PRN

## 2020-09-28 MED ORDER — LEVETIRACETAM IN NACL 500 MG/100ML IV SOLN
500.0000 mg | Freq: Once | INTRAVENOUS | Status: DC
  Filled 2020-09-28: qty 100

## 2020-09-28 MED ORDER — CEFAZOLIN SODIUM-DEXTROSE 2-4 GM/100ML-% IV SOLN
2.0000 g | INTRAVENOUS | Status: AC
  Administered 2020-09-28: 2 g via INTRAVENOUS
  Filled 2020-09-28: qty 100

## 2020-09-28 MED ORDER — SENNOSIDES-DOCUSATE SODIUM 8.6-50 MG PO TABS
2.0000 | ORAL_TABLET | Freq: Every day | ORAL | Status: DC
  Administered 2020-09-29 – 2020-10-02 (×4): 2 via ORAL
  Filled 2020-09-28 (×4): qty 2

## 2020-09-28 MED ORDER — FENTANYL CITRATE (PF) 100 MCG/2ML IJ SOLN
INTRAMUSCULAR | Status: DC | PRN
  Administered 2020-09-28: 15 ug via INTRATHECAL

## 2020-09-28 MED ORDER — FAMOTIDINE 20 MG PO TABS
20.0000 mg | ORAL_TABLET | Freq: Once | ORAL | Status: AC
  Administered 2020-09-28: 20 mg via ORAL
  Filled 2020-09-28: qty 1

## 2020-09-28 MED ORDER — DEXAMETHASONE SODIUM PHOSPHATE 10 MG/ML IJ SOLN
INTRAMUSCULAR | Status: DC | PRN
  Administered 2020-09-28: 10 mg via INTRAVENOUS

## 2020-09-28 MED ORDER — DEXTROSE 50 % IV SOLN
INTRAVENOUS | Status: AC
  Filled 2020-09-28: qty 50

## 2020-09-28 MED ORDER — MAGNESIUM SULFATE BOLUS VIA INFUSION
4.0000 g | Freq: Once | INTRAVENOUS | Status: AC
  Administered 2020-09-28: 4 g via INTRAVENOUS
  Filled 2020-09-28: qty 1000

## 2020-09-28 MED ORDER — NALOXONE HCL 4 MG/10ML IJ SOLN
1.0000 ug/kg/h | INTRAVENOUS | Status: DC | PRN
  Filled 2020-09-28: qty 5

## 2020-09-28 MED ORDER — MEDROXYPROGESTERONE ACETATE 150 MG/ML IM SUSP: 150.0000 mg | INTRAMUSCULAR | Status: DC | PRN

## 2020-09-28 MED ORDER — OXYCODONE HCL 5 MG PO TABS
5.0000 mg | ORAL_TABLET | ORAL | Status: DC | PRN
  Administered 2020-09-29: 10 mg via ORAL
  Administered 2020-09-29: 5 mg via ORAL
  Administered 2020-09-29: 10 mg via ORAL
  Administered 2020-09-29: 5 mg via ORAL
  Administered 2020-09-29 – 2020-10-02 (×11): 10 mg via ORAL
  Filled 2020-09-28 (×10): qty 2
  Filled 2020-09-28: qty 1
  Filled 2020-09-28: qty 2
  Filled 2020-09-28: qty 1
  Filled 2020-09-28 (×2): qty 2

## 2020-09-28 MED ORDER — DIBUCAINE (PERIANAL) 1 % EX OINT: 1.0000 "application " | TOPICAL_OINTMENT | CUTANEOUS | Status: DC | PRN

## 2020-09-28 MED ORDER — SIMETHICONE 80 MG PO CHEW: 80.0000 mg | CHEWABLE_TABLET | ORAL | Status: DC | PRN

## 2020-09-28 MED ORDER — ONDANSETRON HCL 4 MG/2ML IJ SOLN
4.0000 mg | Freq: Three times a day (TID) | INTRAMUSCULAR | Status: DC | PRN
  Administered 2020-09-28 – 2020-10-02 (×6): 4 mg via INTRAVENOUS
  Filled 2020-09-28 (×6): qty 2

## 2020-09-28 MED ORDER — LEVETIRACETAM 500 MG PO TABS
500.0000 mg | ORAL_TABLET | Freq: Two times a day (BID) | ORAL | Status: DC
  Administered 2020-09-29 – 2020-10-02 (×7): 500 mg via ORAL
  Filled 2020-09-28 (×8): qty 1

## 2020-09-28 MED ORDER — STERILE WATER FOR IRRIGATION IR SOLN
Status: DC | PRN
  Administered 2020-09-28: 1

## 2020-09-28 MED ORDER — ONDANSETRON HCL 4 MG/2ML IJ SOLN
INTRAMUSCULAR | Status: DC | PRN
  Administered 2020-09-28: 4 mg via INTRAVENOUS

## 2020-09-28 MED ORDER — SCOPOLAMINE 1 MG/3DAYS TD PT72
1.0000 | MEDICATED_PATCH | Freq: Once | TRANSDERMAL | Status: AC
  Administered 2020-09-28: 1.5 mg via TRANSDERMAL

## 2020-09-28 MED ORDER — SIMETHICONE 80 MG PO CHEW
80.0000 mg | CHEWABLE_TABLET | Freq: Three times a day (TID) | ORAL | Status: DC
  Administered 2020-09-29 – 2020-10-02 (×10): 80 mg via ORAL
  Filled 2020-09-28 (×10): qty 1

## 2020-09-28 MED ORDER — PHENYLEPHRINE HCL-NACL 20-0.9 MG/250ML-% IV SOLN
INTRAVENOUS | Status: AC
  Filled 2020-09-28: qty 250

## 2020-09-28 MED ORDER — DEXAMETHASONE SODIUM PHOSPHATE 10 MG/ML IJ SOLN
INTRAMUSCULAR | Status: AC
  Filled 2020-09-28: qty 1

## 2020-09-28 SURGICAL SUPPLY — 34 items
CHLORAPREP W/TINT 26ML (MISCELLANEOUS) ×2 IMPLANT
CLAMP CORD UMBIL (MISCELLANEOUS) IMPLANT
CLOTH BEACON ORANGE TIMEOUT ST (SAFETY) ×2 IMPLANT
DERMABOND ADVANCED (GAUZE/BANDAGES/DRESSINGS) ×1
DERMABOND ADVANCED .7 DNX12 (GAUZE/BANDAGES/DRESSINGS) ×1 IMPLANT
DRSG OPSITE POSTOP 4X10 (GAUZE/BANDAGES/DRESSINGS) ×2 IMPLANT
ELECT REM PT RETURN 9FT ADLT (ELECTROSURGICAL) ×2
ELECTRODE REM PT RTRN 9FT ADLT (ELECTROSURGICAL) ×1 IMPLANT
EXTRACTOR VACUUM BELL STYLE (SUCTIONS) IMPLANT
GLOVE BIOGEL PI IND STRL 7.0 (GLOVE) ×2 IMPLANT
GLOVE BIOGEL PI INDICATOR 7.0 (GLOVE) ×2
GLOVE ECLIPSE 7.0 STRL STRAW (GLOVE) ×2 IMPLANT
GOWN STRL REUS W/TWL LRG LVL3 (GOWN DISPOSABLE) ×4 IMPLANT
KIT ABG SYR 3ML LUER SLIP (SYRINGE) ×2 IMPLANT
NEEDLE HYPO 25X5/8 SAFETYGLIDE (NEEDLE) ×2 IMPLANT
NS IRRIG 1000ML POUR BTL (IV SOLUTION) ×2 IMPLANT
PACK C SECTION WH (CUSTOM PROCEDURE TRAY) ×2 IMPLANT
PAD OB MATERNITY 4.3X12.25 (PERSONAL CARE ITEMS) ×2 IMPLANT
PENCIL SMOKE EVAC W/HOLSTER (ELECTROSURGICAL) ×2 IMPLANT
RTRCTR C-SECT PINK 25CM LRG (MISCELLANEOUS) ×2 IMPLANT
SUT MNCRL 0 VIOLET CTX 36 (SUTURE) ×2 IMPLANT
SUT MONOCRYL 0 CTX 36 (SUTURE) ×2
SUT PLAIN 0 NONE (SUTURE) IMPLANT
SUT PLAIN 2 0 (SUTURE)
SUT PLAIN 2 0 XLH (SUTURE) IMPLANT
SUT PLAIN ABS 2-0 CT1 27XMFL (SUTURE) IMPLANT
SUT VIC AB 0 CTX 36 (SUTURE) ×1
SUT VIC AB 0 CTX36XBRD ANBCTRL (SUTURE) ×1 IMPLANT
SUT VIC AB 2-0 CT1 27 (SUTURE) ×1
SUT VIC AB 2-0 CT1 TAPERPNT 27 (SUTURE) ×1 IMPLANT
SUT VIC AB 4-0 KS 27 (SUTURE) ×2 IMPLANT
TOWEL OR 17X24 6PK STRL BLUE (TOWEL DISPOSABLE) ×2 IMPLANT
TRAY FOLEY W/BAG SLVR 14FR LF (SET/KITS/TRAYS/PACK) IMPLANT
WATER STERILE IRR 1000ML POUR (IV SOLUTION) ×2 IMPLANT

## 2020-09-28 NOTE — Transfer of Care (Signed)
Immediate Anesthesia Transfer of Care Note  Patient: Leah Shea  Procedure(s) Performed: CESAREAN SECTION  Patient Location: PACU  Anesthesia Type:Spinal  Level of Consciousness: awake, alert  and oriented  Airway & Oxygen Therapy: Patient Spontanous Breathing  Post-op Assessment: Report given to RN and Post -op Vital signs reviewed and stable  Post vital signs: Reviewed and stable  Last Vitals:  Vitals Value Taken Time  BP 130/97 09/28/20 1930  Temp    Pulse 101 09/28/20 1935  Resp 19 09/28/20 1935  SpO2 100 % 09/28/20 1935  Vitals shown include unvalidated device data.  Last Pain:  Vitals:   09/28/20 1608  TempSrc: Oral  PainSc:          Complications: No notable events documented.

## 2020-09-28 NOTE — Anesthesia Procedure Notes (Signed)
Spinal  Patient location during procedure: OR Start time: 09/28/2020 6:00 PM End time: 09/28/2020 6:03 PM Reason for block: surgical anesthesia Staffing Performed: anesthesiologist  Anesthesiologist: Elmer Picker, MD Preanesthetic Checklist Completed: patient identified, IV checked, risks and benefits discussed, surgical consent, monitors and equipment checked, pre-op evaluation and timeout performed Spinal Block Patient position: sitting Prep: DuraPrep and site prepped and draped Patient monitoring: cardiac monitor, continuous pulse ox and blood pressure Approach: midline Location: L3-4 Injection technique: single-shot Needle Needle type: Pencan  Needle gauge: 24 G Needle length: 9 cm Assessment Sensory level: T6 Events: CSF return Additional Notes Functioning IV was confirmed and monitors were applied. Sterile prep and drape, including hand hygiene and sterile gloves were used. The patient was positioned and the spine was prepped. The skin was anesthetized with lidocaine.  Free flow of clear CSF was obtained prior to injecting local anesthetic into the CSF.  The spinal needle aspirated freely following injection.  The needle was carefully withdrawn.  The patient tolerated the procedure well.
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
## 2020-09-28 NOTE — Anesthesia Postprocedure Evaluation (Signed)
Anesthesia Post Note  Patient: Leah Shea  Procedure(s) Performed: CESAREAN SECTION     Patient location during evaluation: PACU Anesthesia Type: Spinal Level of consciousness: oriented and awake and alert Pain management: pain level controlled Vital Signs Assessment: post-procedure vital signs reviewed and stable Respiratory status: spontaneous breathing, respiratory function stable and patient connected to nasal cannula oxygen Cardiovascular status: blood pressure returned to baseline and stable Postop Assessment: no headache, no backache and no apparent nausea or vomiting Anesthetic complications: no   No notable events documented.  Last Vitals:  Vitals:   09/28/20 2045 09/28/20 2100  BP: 138/89 139/76  Pulse: (!) 101 (!) 104  Resp: 11 20  Temp:    SpO2: 95% 98%    Last Pain:  Vitals:   09/28/20 2030  TempSrc:   PainSc: 7    Pain Goal:                   [name] L [name]

## 2020-09-28 NOTE — Anesthesia Preprocedure Evaluation (Signed)
Anesthesia Evaluation  Patient identified by MRN, date of birth, ID band Patient awake    Reviewed: Allergy & Precautions, H&P , NPO status , Patient's Chart, lab work & pertinent test results, reviewed documented beta blocker date and time   Airway Mallampati: II  TM Distance: >3 FB Neck ROM: full    Dental no notable dental hx.    Pulmonary sleep apnea , former smoker,    Pulmonary exam normal breath sounds clear to auscultation       Cardiovascular hypertension, Pt. on medications negative cardio ROS Normal cardiovascular exam Rhythm:regular Rate:Normal     Neuro/Psych  Headaches, Seizures -, Well Controlled,  PSYCHIATRIC DISORDERS Anxiety Depression    GI/Hepatic GERD  Medicated,(+)     substance abuse  alcohol use and marijuana use,   Endo/Other  negative endocrine ROSdiabetes  Renal/GU negative Renal ROS  negative genitourinary   Musculoskeletal   Abdominal   Peds  Hematology  (+) Blood dyscrasia, anemia ,   Anesthesia Other Findings   Reproductive/Obstetrics (+) Pregnancy                             Anesthesia Physical Anesthesia Plan  ASA: 3 and emergent  Anesthesia Plan: Spinal   Post-op Pain Management:    Induction:   PONV Risk Score and Plan: 2 and Scopolamine patch - Pre-op  Airway Management Planned: Natural Airway and Nasal Cannula  Additional Equipment: None  Intra-op Plan:   Post-operative Plan:   Informed Consent: I have reviewed the patients History and Physical, chart, labs and discussed the procedure including the risks, benefits and alternatives for the proposed anesthesia with the patient or authorized representative who has indicated his/her understanding and acceptance.       Plan Discussed with: Anesthesiologist and CRNA  Anesthesia Plan Comments:         Anesthesia Quick Evaluation

## 2020-09-28 NOTE — MAU Provider Note (Signed)
History     CSN: [id number]  Arrival date and time: 09/28/20 1151    Event Date/Time   First Provider Initiated Contact with Patient 09/28/20 1253       Chief Complaint  Patient presents with   Hypertension   Headache   HPI This is a 35 year old G7 P2-2-2-4 at 34 weeks and 5 days with a history of chronic hypertension not currently on medication, gestational diabetes on insulin and metformin, partial seizure disorder, G6PD deficiency. She was sent to MAU from MFM due to elevated BP and headache. Her BP has been elevated over the weekend: 140-150/90-110. She does have a headache that is different than her normal migraines and has some black spots in her vision.   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   Transferred from DOD/VA problem list   PTSD (post-traumatic stress disorder)    Shortness of breath    with exertion or panic attack   Sickle cell trait ([place])    Sleep apnea    awaiting a sleep study to be done at [facility]    Past Surgical History:  Procedure Laterality Date   BREAST SURGERY Bilateral    reductions   CESAREAN SECTION  2010, 2012,2014   x 3   EYE SURGERY     x 10 as a child   PILONIDAL CYST EXCISION  2020   TONSILLECTOMY N/A 10/07/2013   Procedure: TONSILLECTOMY;  Surgeon: Melida Quitter, MD;  Location: [facility] OR;  Service: ENT;  Laterality: N/A;   TUMOR REMOVAL     tumor removed from back    Family History  Problem Relation Age of Onset   Breast cancer Mother    Breast cancer  Maternal Aunt 36       metastatic   Cancer Maternal Uncle        unk type   Breast cancer Maternal Grandmother    Prostate cancer Maternal Grandfather        metastatic   Cancer Maternal Aunt        either breast or ovarian     Social History   Tobacco Use   Smoking status: Former    Packs/day: 0.25    Years: 1.00    Pack years: 0.25    Types: Cigarettes    Quit date: 07/02/2013    Years since quitting: 7.2   Smokeless tobacco: Never  Vaping Use   Vaping Use: Never used  Substance Use Topics   Alcohol use: Not Currently   Drug use: Not Currently    Types: Marijuana    Comment: last used January 3    Allergies:  Allergies  Allergen Reactions   Other Anaphylaxis and Hives  Mushrooms   Nsaids Hives, Swelling and Other (See Comments)    Body burns    Reglan [Metoclopramide] Other (See Comments)    anxiety   Sulfa Antibiotics Other (See Comments)    burns    Medications Prior to Admission  Medication Sig Dispense Refill Last Dose   Accu-Chek Softclix Lancets lancets Check blood glucose by percutaneous route 4 (four) times daily 100 each 12    acetaminophen (TYLENOL) 500 MG tablet Take 2 tablets (1,000 mg total) by mouth every 6 (six) hours as needed for moderate pain. 30 tablet 0    Blood Glucose Monitoring Suppl (ACCU-CHEK GUIDE) w/Device KIT Check blood glucose by percutaneous route 4 (four) times daily 1 kit 0    Blood Pressure Monitoring (BLOOD PRESSURE KIT) DEVI 1 kit by Does not apply route once a week. 1 each 0    busPIRone (BUSPAR) 15 MG tablet Take by mouth.      cyclobenzaprine (FLEXERIL) 10 MG tablet Take 1 tablet (10 mg total) by mouth 3 (three) times daily as needed (back pain/muscle pain). 30 tablet 0    diphenhydrAMINE (BENADRYL) 50 MG tablet Take 0.5 tablets (25 mg total) by mouth at bedtime as needed for sleep. 30 tablet 0    Doxylamine-Pyridoxine (DICLEGIS) 10-10 MG TBEC Take 2 tablets by mouth at bedtime. If symptoms persist, add one tablet in the  morning and one in the afternoon 100 tablet 5    escitalopram (LEXAPRO) 10 MG tablet Take 1 tablet (10 mg total) by mouth daily. 30 tablet 3    folic acid (FOLVITE) 1 MG tablet Take 1 tablet (1 mg total) by mouth daily. 30 tablet 10    glucose blood (ACCU-CHEK GUIDE) test strip Check blood glucose by percutaneous route 4 (four) times daily 50 each 12    hydrOXYzine (VISTARIL) 25 MG capsule Take 2 capsules (50 mg total) by mouth 3 (three) times daily as needed for anxiety. 60 capsule 2    insulin glargine (LANTUS SOLOSTAR) 100 UNIT/ML Solostar Pen Inject 40 Units into the skin daily. 15 mL 11    Insulin Lispro Junior KwikPen (HUMALOG JR) 100 UNIT/ML KwikPen Inject 8 Units into the skin 3 (three) times daily before meals. 3 mL 6    Insulin Pen Needle 31G X 6 MM MISC Inject 1 application into the skin 4 (four) times daily as needed. Use with insulin pen for each injection then discard 90 each 5    levETIRAcetam (KEPPRA) 500 MG tablet Take 1 tablet (500 mg total) by mouth 2 (two) times daily. 60 tablet 2    metFORMIN (GLUCOPHAGE) 500 MG tablet Take 1 tablet (500 mg total) by mouth 2 (two) times daily with a meal. (Patient not taking: Reported on 09/28/2020) 60 tablet 5    ondansetron (ZOFRAN ODT) 8 MG disintegrating tablet Take 1 tablet (8 mg total) by mouth every 8 (eight) hours as needed for nausea or vomiting. 30 tablet 2    pantoprazole (PROTONIX) 40 MG tablet Take 1 tablet (40 mg total) by mouth daily. 30 tablet 6    promethazine (PHENERGAN) 25 MG tablet Take 1 tablet (25 mg total) by mouth every 6 (six) hours as needed for nausea or vomiting. 30 tablet 0    scopolamine (TRANSDERM-SCOP) 1 MG/3DAYS Place 1 patch (1.5 mg total) onto the skin every 3 (three) days. 10 patch 12    scopolamine (TRANSDERM-SCOP) 1 MG/3DAYS Place 1 patch  onto the skin every 3 (three) days. (Patient not taking: Reported on 09/28/2020)  10 patch 12    Vitamin D, Ergocalciferol, (DRISDOL) 1.25 MG (50000 UNIT) CAPS capsule Take 1  capsule (50,000 Units total) by mouth every 7 (seven) days. 5 capsule 2     Review of Systems  All other systems reviewed and are negative. Physical Exam   Patient Vitals for the past 24 hrs:  BP Temp Temp src Pulse Resp SpO2 Height Weight  09/28/20 1431 (!) 151/98 -- -- (!) 101 -- -- -- --  09/28/20 1416 (!) 145/92 -- -- (!) 101 -- -- -- --  09/28/20 1401 (!) 148/89 -- -- (!) 106 -- -- -- --  09/28/20 1346 (!) 135/100 -- -- 98 -- -- -- --  09/28/20 1331 (!) 146/89 -- -- (!) 103 -- -- -- --  09/28/20 1315 (!) 151/98 -- -- 100 -- 99 % -- --  09/28/20 1301 (!) 137/92 -- -- (!) 102 -- -- -- --  09/28/20 1246 (!) 154/100 -- -- (!) 110 -- -- -- --  09/28/20 1231 (!) 158/108 -- -- (!) 103 -- -- -- --  09/28/20 1219 (!) 148/94 98.6 F (37 C) Oral (!) 103 16 99 % -- --  09/28/20 1203 -- -- -- -- -- -- 5' 6.5" (1.689 m) 101.1 kg     Body mass index is 35.44 kg/m.  Blood pressure (!) 158/108, pulse (!) 103, temperature 98.6 F (37 C), temperature source Oral, resp. rate 16, height 5' 6.5" (1.689 m), weight 101.1 kg, last menstrual period 01/29/2020, SpO2 99 %.  Physical Exam Vitals reviewed.  Constitutional:      Appearance: She is well-developed.  Cardiovascular:     Rate and Rhythm: Normal rate and regular rhythm.     Heart sounds: Normal heart sounds.  Pulmonary:     Effort: Pulmonary effort is normal.     Breath sounds: Normal breath sounds.  Abdominal:     Palpations: Abdomen is soft.  Skin:    General: Skin is warm.  Neurological:     Mental Status: She is alert.  Psychiatric:        Mood and Affect: Mood normal.        Speech: Speech normal.        Behavior: Behavior normal.   Results for orders placed or performed during the hospital encounter of 09/28/20 (from the past 24 hour(s))  Protein / creatinine ratio, urine     Status: Abnormal   Collection Time: 09/28/20 12:09 PM  Result Value Ref Range   Creatinine, Urine 56.46 mg/dL   Total Protein, Urine 10 mg/dL    Protein Creatinine Ratio 0.18 (H) 0.00 - 0.15 mg/mg[Cre]  Urinalysis, Routine w reflex microscopic Urine, Clean Catch     Status: Abnormal   Collection Time: 09/28/20 12:09 PM  Result Value Ref Range   Color, Urine YELLOW YELLOW   APPearance HAZY (A) CLEAR   Specific Gravity, Urine 1.010 1.005 - 1.030   pH 6.0 5.0 - 8.0   Glucose, UA NEGATIVE NEGATIVE mg/dL   Hgb urine dipstick NEGATIVE NEGATIVE   Bilirubin Urine NEGATIVE NEGATIVE   Ketones, ur NEGATIVE NEGATIVE mg/dL   Protein, ur NEGATIVE NEGATIVE mg/dL   Nitrite NEGATIVE NEGATIVE   Leukocytes,Ua TRACE (A) NEGATIVE   RBC / HPF 0-5 0 - 5 RBC/hpf   WBC, UA 0-5 0 - 5 WBC/hpf   Bacteria, UA RARE (A) NONE SEEN   Squamous Epithelial / LPF 0-5 0 - 5  CBC     Status: Abnormal   Collection Time:  09/28/20 12:23 PM  Result Value Ref Range   WBC 8.9 4.0 - 10.5 K/uL   RBC 3.28 (L) 3.87 - 5.11 MIL/uL   Hemoglobin 10.1 (L) 12.0 - 15.0 g/dL   HCT 29.5 (L) 36.0 - 46.0 %   MCV 89.9 80.0 - 100.0 fL   MCH 30.8 26.0 - 34.0 pg   MCHC 34.2 30.0 - 36.0 g/dL   RDW 12.8 11.5 - 15.5 %   Platelets 161 150 - 400 K/uL   nRBC 0.2 0.0 - 0.2 %  Comprehensive metabolic panel     Status: Abnormal   Collection Time: 09/28/20 12:23 PM  Result Value Ref Range   Sodium 134 (L) 135 - 145 mmol/L   Potassium 3.5 3.5 - 5.1 mmol/L   Chloride 105 98 - 111 mmol/L   CO2 21 (L) 22 - 32 mmol/L   Glucose, Bld 88 70 - 99 mg/dL   BUN 8 6 - 20 mg/dL   Creatinine, Ser 0.76 0.44 - 1.00 mg/dL   Calcium 8.7 (L) 8.9 - 10.3 mg/dL   Total Protein 6.0 (L) 6.5 - 8.1 g/dL   Albumin 2.6 (L) 3.5 - 5.0 g/dL   AST 13 (L) 15 - 41 U/L   ALT 14 0 - 44 U/L   Alkaline Phosphatase 91 38 - 126 U/L   Total Bilirubin 0.1 (L) 0.3 - 1.2 mg/dL   GFR, Estimated >60 >60 mL/min   Anion gap 8 5 - 15     MAU Course  Procedures NST baseline 120, moderate variability, + accel, no decel.  MDM Persistently elevated BP Headache not improved after fioricet. Start mag. Recommend delivery.    Assessment and Plan  NPO Will move towards delivery.  Truett Mainland 09/28/2020, 12:53 PM

## 2020-09-28 NOTE — MAU Note (Signed)
Leah Shea is a 35 y.o. at [redacted]w[redacted]d here in MAU reporting: sent over from the office for HTN eval. States her BP has been elevated since Friday. Has a headache and is seeing spots.   Onset of complaint: ongoing  Pain score: 6/10  Vitals:   09/28/20 1219  BP: (!) 148/94  Pulse: (!) 103  Resp: 16  Temp: 98.6 F (37 C)  SpO2: 99%     FHT: EFM applied in room  Lab orders placed from triage: UA

## 2020-09-28 NOTE — Op Note (Signed)
Preoperative diagnosis:  1.  Intrauterine pregnancy at [redacted]w[redacted]d  weeks gestation                                         2.  Severe pre eclampsia                                         3.  Previous C section x 3                                         4.  A2DM   Postoperative diagnosis:  Same as above   Procedure:  Repeat cesarean section  Surgeon:  Lazaro Arms MD  Assistant:  Leticia Penna, DO  Anesthesia: Spinal  Findings:  .    Over a low transverse incision was delivered a viable female with Apgars of 7/8 and  weighing 4 lbs.  9 oz. Uterus, tubes and ovaries were all normal.  There were no other significant findings  Description of operation:  Patient was taken to the operating room and placed in the sitting position where she underwent a spinal anesthetic. She was then placed in the supine position with tilt to the left side. When adequate anesthetic level was obtained she was prepped and draped in usual sterile fashion and a Foley catheter was placed. A Pfannenstiel skin incision was made and carried down sharply to the rectus fascia which was scored in the midline extended laterally. The fascia was taken off the muscles both superiorly and without difficulty. The muscles were divided.  The peritoneal cavity was entered.  Bladder blade was placed, no bladder flap was created.  A low transverse hysterotomy incision was made and delivered a viable female  infant at 39 with Apgars of 7/8 and  weighing 4 lbs  9 oz.  Arterial Cord pH was obtained and was 7.313 The uterus was exteriorized. It was closed in 2 layers, the first being a running interlocking layer and the second being an imbricating layer using 0 monocryl on a CTX needle. There was good resulting hemostasis. The uterus tubes and ovaries were all normal. Peritoneal cavity was irrigated vigorously. The muscles and peritoneum were reapproximated loosely. The fascia was closed using 0 Vicryl in running fashion. Subcutaneous tissue was  made hemostatic and irrigated. The skin was closed using 4-0 Vicryl on a Keith needle in a subcuticular fashion.  Dermabond was placed for additional wound integrity and to serve as a barrier. Blood loss for the procedure was pending cc. The patient received a gram of Ancef prophylactically. The patient was taken to the recovery room in good stable condition with all counts being correct x3.  EBL pending  cc  Lazaro Arms 09/28/2020 7:02 PM

## 2020-09-29 ENCOUNTER — Encounter (HOSPITAL_COMMUNITY): Payer: Self-pay | Admitting: Family Medicine

## 2020-09-29 LAB — CBC
HCT: 28.7 % — ABNORMAL LOW (ref 36.0–46.0)
Hemoglobin: 9.8 g/dL — ABNORMAL LOW (ref 12.0–15.0)
MCH: 30.6 pg (ref 26.0–34.0)
MCHC: 34.1 g/dL (ref 30.0–36.0)
MCV: 89.7 fL (ref 80.0–100.0)
Platelets: 155 10*3/uL (ref 150–400)
RBC: 3.2 MIL/uL — ABNORMAL LOW (ref 3.87–5.11)
RDW: 12.5 % (ref 11.5–15.5)
WBC: 13.9 10*3/uL — ABNORMAL HIGH (ref 4.0–10.5)
nRBC: 0 % (ref 0.0–0.2)

## 2020-09-29 LAB — RPR: RPR Ser Ql: NONREACTIVE

## 2020-09-29 LAB — GLUCOSE, CAPILLARY: Glucose-Capillary: 133 mg/dL — ABNORMAL HIGH (ref 70–99)

## 2020-09-29 MED ORDER — SODIUM CHLORIDE 0.9% FLUSH: 10.0000 mL | INTRAVENOUS | Status: DC | PRN

## 2020-09-29 MED ORDER — INSULIN ASPART 100 UNIT/ML IJ SOLN: 0.0000 [IU] | Freq: Three times a day (TID) | INTRAMUSCULAR | Status: DC

## 2020-09-29 MED ORDER — LORAZEPAM 0.5 MG PO TABS
0.5000 mg | ORAL_TABLET | Freq: Four times a day (QID) | ORAL | Status: DC | PRN
  Administered 2020-09-29 – 2020-10-01 (×4): 0.5 mg via ORAL
  Filled 2020-09-29 (×4): qty 1

## 2020-09-29 MED ORDER — LABETALOL HCL 5 MG/ML IV SOLN: 20.0000 mg | Freq: Once | INTRAVENOUS | Status: DC

## 2020-09-29 MED ORDER — FERROUS SULFATE 325 (65 FE) MG PO TABS
325.0000 mg | ORAL_TABLET | ORAL | Status: DC
  Administered 2020-09-30: 325 mg via ORAL
  Filled 2020-09-29: qty 1

## 2020-09-29 MED ORDER — CHLORHEXIDINE GLUCONATE CLOTH 2 % EX PADS
6.0000 | MEDICATED_PAD | Freq: Every day | CUTANEOUS | Status: DC
  Administered 2020-09-29 – 2020-10-01 (×3): 6 via TOPICAL

## 2020-09-29 MED ORDER — LABETALOL HCL 200 MG PO TABS
200.0000 mg | ORAL_TABLET | Freq: Three times a day (TID) | ORAL | Status: DC
  Administered 2020-09-29 – 2020-10-02 (×10): 200 mg via ORAL
  Filled 2020-09-29 (×10): qty 1

## 2020-09-29 MED ORDER — SODIUM CHLORIDE 0.9% FLUSH
10.0000 mL | Freq: Two times a day (BID) | INTRAVENOUS | Status: DC
  Administered 2020-09-29 – 2020-10-01 (×4): 10 mL

## 2020-09-29 MED ORDER — LABETALOL HCL 5 MG/ML IV SOLN
INTRAVENOUS | Status: AC
  Filled 2020-09-29: qty 4

## 2020-09-29 NOTE — Progress Notes (Signed)
Subjective: Postpartum Day 1: Repeat Cesarean Delivery @ [redacted]w[redacted]d due to [facility] w/SIPE w/SF (HA) Patient reports incisional pain and tolerating PO.  She had some urinary retention overnight and required straight cath. She has not yet felt urge to void again. She notes continued HA on the Magnesium. She also reports baseline anxiety controlled with lexapro and takes lorazepam at home on occasion for breakthrough panic attacks. Her pain is overall controlled. No flatus or BM yet.   Objective: Vital signs in last 24 hours: Temp:  [97.5 F (36.4 C)-98.6 F (37 C)] 97.8 F (36.6 C) (08/16 1118) Pulse Rate:  [80-110] 91 (08/16 1118) Resp:  [11-22] 18 (08/16 1118) BP: (125-170)/(60-108) 156/80 (08/16 1118) SpO2:  [94 %-100 %] 100 % (08/16 1118) Weight:  [101.1 kg] 101.1 kg (08/15 1203)  Physical Exam:  General: alert and no distress Lochia: appropriate Uterine Fundus: firm Incision: healing well, no significant drainage, no dehiscence, no significant erythema DVT Evaluation: No evidence of DVT seen on physical exam. No cords or calf tenderness. Calf/Ankle edema is present (mild) Abdomen is soft, appropriately tender with mild distension  Recent Labs    09/28/20 2124 09/29/20 0417  HGB 9.7* 9.8*  HCT 28.5* 28.7*    Assessment/Plan: Status post Cesarean section. Doing well postoperatively.  - Continue current care for routine postop care - Abdominal binder ordered - Baby in NICU, baby boy. Circ consent reviewed and signed with pt. Will put on baby chart in the NICU.  - Birth control: Undecided  CHTN with SIPE with HA - Continue Magnesium until 24 hours s/p delivery - Initiate Labetalol 200 TID for elevated Bps despite magnesium. She was on no medications prior to pregnancy. Discussed importance of blood pressure control postpartum to prevent stroke/MI. Reviewed blood pressure checks in the office once discharged home  Anxiety - She is on her baseline lexapro. Resumed lorazepam - She  will ask nursing about her support pet.   GDMA2 - Was on Insulin for control. Will discontinue Semglee and add Novolog per DM coordinator recommendations  Milas Hock 09/29/2020, 11:20 AM

## 2020-09-29 NOTE — Lactation Note (Signed)
This note was copied from a baby's chart. Lactation Consultation Note  Patient Name: Leah Shea [id number] Date: 09/29/2020   Age:35 hours  Attempted to visit with mom but she was in the bathroom. Mom overheard LC coming in the room and she voiced she has already started pumping but "nothing came out". Briefly explained to mom pumping expectations and provided reassurance.  Mom will be visiting baby in the NICU this afternoon and requested for her LC visit to be in the NICU. LC to visit mom in baby's room to do initial assessment.   Leah Shea S Katonya Blecher 09/29/2020, 12:49 PM

## 2020-09-29 NOTE — Lactation Note (Addendum)
This note was copied from a baby's chart. Lactation Consultation Note  Patient Name: Leah Shea [id number] Date: 09/29/2020 Reason for consult: Follow-up assessment;NICU baby;Late-preterm 34-36.6wks;Infant < 6lbs;Breast reduction;Maternal endocrine disorder Age:35 hours  Visited with mom of 20 hours old LPI NICU female, she's a P5 and has some experience BF. She reported (+) breast changes during the pregnancy, despite of having a breast reduction in 2014 after having baby # 3.  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 Mom was very emotional during [facility] consultation, provided emotional support and let her know about all the benefits of premature milk and donor milk.  Plan of care:  Encouraged mom to pump every 2-3 hours; at least 8 pumping sessions/24 hours Breast massage, hand expression and coconut oil were also encouraged prior pumping  No other support person in the room at this time. All questions and concerns answered, mom to call NICU LC PRN.  Maternal Data Has patient been taught Hand Expression?: Yes Does the patient have breastfeeding experience prior to this delivery?: Yes How long did the patient breastfeed?: The longest she BF 6 months, BF her last baby for 2 months  Feeding Mother's Current Feeding Choice: Breast Milk  Lactation Tools Discussed/Used Tools: Pump;Flanges Breast pump type: Double-Electric Breast Pump Pump Education: Setup, frequency, and cleaning;Milk Storage Reason for Pumping: LPI < 5 lbs in NICU Pumping frequency: q 3 hours Pumped volume:   (drops)  Interventions Interventions: Breast feeding basics reviewed;DEBP;Education  Discharge Pump: DEBP WIC Program: Yes  Consult Status Consult Status: Follow-up Follow-up type: In-patient    Leah Shea 09/29/2020, 3:08 PM

## 2020-09-29 NOTE — Progress Notes (Signed)
Inpatient Diabetes Program Recommendations  AACE/ADA: New Consensus Statement on Inpatient Glycemic Control (2015)  Target Ranges:  Prepandial:   less than 140 mg/dL      Peak postprandial:   less than 180 mg/dL (1-2 hours)      Critically ill patients:  140 - 180 mg/dL   Lab Results  Component Value Date   GLUCAP 133 (H) 09/29/2020    Review of Glycemic Control Results for Leah Shea, Leah Shea (MRN [id number]) as of 09/29/2020 09:07  Ref. Range 09/28/2020 20:05 09/28/2020 21:00 09/29/2020 04:30  Glucose-Capillary Latest Ref Range: 70 - 99 mg/dL 57 (L) 878 (H) 676 (H)   Diabetes history: GDM Outpatient Diabetes medications: Lantus 40 units QHS, Humalog 8 units TID Current orders for Inpatient glycemic control: Semglee 20 units QHS Decadron 10 mg x 1  Inpatient Diabetes Program Recommendations:    Noted hypoglycemia yesterday intraop of 57 mg/dL. Patient was not on insulin prior to pregnancy. Slightly elevated this AM due to patient receiving Decadron.  Would consider discontinuing Semglee and adding Novolog 0-9 units TID.   Thanks, Lujean Rave, MSN, RNC-OB Diabetes Coordinator [phone] (8a-5p)

## 2020-09-30 ENCOUNTER — Encounter: Payer: No Typology Code available for payment source | Admitting: Obstetrics and Gynecology

## 2020-09-30 MED ORDER — CHEWING GUM (ORBIT) SUGAR FREE
1.0000 | CHEWING_GUM | Freq: Four times a day (QID) | ORAL | Status: DC
  Administered 2020-09-30 – 2020-10-02 (×8): 1 via ORAL
  Filled 2020-09-30: qty 1

## 2020-09-30 MED ORDER — HYDROMORPHONE HCL 1 MG/ML IJ SOLN
0.5000 mg | INTRAMUSCULAR | Status: DC | PRN
  Administered 2020-09-30 – 2020-10-01 (×3): 0.5 mg via INTRAVENOUS
  Filled 2020-09-30 (×4): qty 0.5

## 2020-09-30 NOTE — Progress Notes (Signed)
Subjective: Postpartum Day 2: Cesarean Delivery Patient reports nausea, vomiting, incisional pain, and no problems voiding.  She denies flatus and feels distended. She is ambulating. She is keeping down some PO but states she has had emesis. She does not feel like her pain is well controlled today.   Objective: Vital signs in last 24 hours: Temp:  [98.1 F (36.7 C)-98.3 F (36.8 C)] 98.3 F (36.8 C) (08/17 1114) Pulse Rate:  [69-97] 69 (08/17 1114) Resp:  [17-18] 18 (08/17 1114) BP: (124-165)/(62-93) 133/63 (08/17 1114) SpO2:  [98 %-99 %] 99 % (08/17 1114)  Physical Exam:  General: alert, cooperative, and no distress Lochia: appropriate Uterine Fundus: firm Incision: healing well, no significant drainage, no dehiscence, no significant erythema DVT Evaluation: No evidence of DVT seen on physical exam. No cords or calf tenderness. Abdomen: Firm with minimal bowel sounds, appropriately tender  Recent Labs    09/28/20 2124 09/29/20 0417  HGB 9.7* 9.8*  HCT 28.5* 28.7*    Assessment/Plan: Status post Cesarean section. Postoperative course complicated by likely ileus   - Continue current care but encouraged ambulation to aid with likely ileus. Gum ordered - Discussed the impact of opioids on bowel function and nausea and encouraged minimal use  CHTN with SIPE with HA - Now s/p Mag - Bps well controlled on Labetalol 200 TID. Will continue this regimen - Will do 1 wk BP check in the office following discharge  GDMA2 - No medications at this time - Will need 2 hr at [facility] check  FWB - Baby in NICU doing well. Circ consent signed with pt and left with baby's chart in room.   Anemia of Pregnancy  - Continue iron once home, but will not add at this time due to ileus    Leah Shea 09/30/2020, 12:02 PM

## 2020-09-30 NOTE — Clinical Social Work Maternal (Signed)
CLINICAL SOCIAL WORK MATERNAL/CHILD NOTE  Patient Details  Name: Leah Shea MRN: [id number] Date of Birth: 01-05-1986  Date:  09/30/2020  Clinical Social Worker Initiating Note:  Laurey Arrow Date/Time: Initiated:  09/30/20/1142     Child's Name:  Seabron Spates   Biological Parents:  Mother, Father (FOB is Burnett Corrente 01/11/1986)   Need for Interpreter:  None   Reason for Referral:  Behavioral Health Concerns   Address:  [place] [address]    Phone number:  [phone] (home)     Additional phone number: FOB number is [phone]  Household Members/Support Persons (HM/SP):   Household Member/Support Person 1, Household Member/Support Person 2, Household Member/Support Person 3, Household Member/Support Person 4   HM/SP Name Relationship DOB or Age  HM/SP -1 Kassaundra Hair daughter 12/26/2004  HM/SP -2 Henrietta Dine Webbmarcisse son 01/31/09  HM/SP -3 Zion Webbmarcisse son 06/04/10  HM/SP -4 Anthyno Laurence Ferrari. son 11/01/12  HM/SP -5        HM/SP -6        HM/SP -7        HM/SP -8          Natural Supports (not living in the home):  Spouse/significant other, Parent, Extended Family, Other (Comment) Geophysical data processor family)   Professional Supports: Transport planner (MOB has a Transport planner with thw [place])   Employment: Disabled   Type of Work:     Education:  Forensic psychologist   Homebound arranged:    Museum/gallery curator Resources:  Other (Comment[place] ([place])   Other Resources:  [facility], Food Stamps     Cultural/Religious Considerations Which May Impact Care:  Per MOB's Face Sheet, MOB is [place].   Strengths:  Ability to meet basic needs  , Compliance with medical plan  , Understanding of illness, Psychotropic Medications, Pediatrician chosen   Psychotropic Medications:  Lexapro, Other meds (Vistaril)      Pediatrician:    [place]  Pediatrician List:   [facility]      Pediatrician Fax Number:    Risk Factors/Current Problems:  Substance Use  , Mental Health Concerns     Cognitive State:  Linear Thinking  , Insightful  , Goal Oriented     Mood/Affect:  Interested  , Comfortable  , Happy  , Relaxed  , Calm     CSW Assessment: CSW met with parents in MOB's first floor room/107 to offer support and complete assessment due to hx of Anxiety/Dep and marijuana use.  When CSW arrived, FOB was playing a game and MOB was resting in bed. CSW explained CSW's role and with MOB's permission, CSW asked FOB to step outside the room in order to assess MOB in private; FOB left without incident. MOB was quiet, but pleasant.  She reports that she and FOB are not together but they do well co parenting.  MOB shared not having all essential items for infant due to infant premature birth however plans to obtain all items prior to MOB's discharge.  MOB is aware to contact CSW if she is unable to obtain essential items. She is aware of SIDS precautions.  She acknowledges hx of anxiety , depression, and PTSD and reported that she receives medication management and outpatient therapy at the [facility] hospital. CSW provided education regarding the baby blues period vs. perinatal mood disorders, discussed treatment and gave resources  for mental health follow up if concerns arise.  CSW recommends self-evaluation during the postpartum time period using the New Mom Checklist from Postpartum Progress and encouraged MOB to contact a medical professional if symptoms are noted at any time. MOB presented with insight and awareness and did not display any acute MH symptoms.  MOB reported having a good support team and reported feeling comfortable seeking help if needed. When CSW assessed for safety, MOB denied SI, HI, and DV.  CSW provided review of Sudden Infant Death Syndrome (SIDS) precautions.    CSW inquired about marijuana use.  MOB stated her last use of marijuana  was February of 2022 however she has continued to use CBDs to help decrease her nausea. CSW provided MOB information about hospital's drug screen policy and MOB denied having any questions or concerns.  MOB is aware that will monitor infant's CDS and will make a report if warranted. MOB denied having any CPS hx.  CSW will continue to offer resources and supports to family while infant remains in NICU.    CSW Plan/Description:  Psychosocial Support and Ongoing Assessment of Needs, Sudden Infant Death Syndrome (SIDS) Education, Perinatal Mood and Anxiety Disorder (PMADs) Education, Other Patient/Family Education, [place], Other Information/Referral to [employer], CSW Will Continue to Monitor Umbilical Cord Tissue Drug Screen Results and Make Report if Warranted   Laurey Arrow, MSW, LCSW Clinical Social Work [phone]  Dimple Nanas, [place] 09/30/2020, 12:10 PM

## 2020-09-30 NOTE — Lactation Note (Signed)
This note was copied from a baby's chart. Lactation Consultation Note Mother is pumping frequently for her baby. She has communicated with the [facility] program and plans to pick up pump on day of d/c. She is aware of LC services in NICU. Will plan f/u to further assist prn.  POC: Continue to pump 15 minutes q 3 hours HE for 1-2 minutes p pumping Schedule WIC appt for day of d/c Initiate sts / bf in NICU when baby is ready  Patient Name: Leah Shea JQZES'P Date: 09/30/2020 Reason for consult: Follow-up assessment Age:3 hours  Maternal Data  Pumping frequency: q3  Feeding Mother's Current Feeding Choice: Breast Milk and Donor Milk  Interventions Interventions: Education  Consult Status Consult Status: Follow-up Follow-up type: In-patient   Elder Negus, MA IBCLC 09/30/2020, 10:16 AM

## 2020-10-01 ENCOUNTER — Encounter (HOSPITAL_COMMUNITY): Payer: Self-pay | Admitting: Obstetrics & Gynecology

## 2020-10-01 LAB — SURGICAL PATHOLOGY

## 2020-10-01 MED ORDER — PROCHLORPERAZINE MALEATE 10 MG PO TABS
10.0000 mg | ORAL_TABLET | Freq: Once | ORAL | Status: AC
  Administered 2020-10-01: 10 mg via ORAL
  Filled 2020-10-01 (×2): qty 1

## 2020-10-01 MED ORDER — METFORMIN HCL 500 MG PO TABS
500.0000 mg | ORAL_TABLET | Freq: Two times a day (BID) | ORAL | Status: DC
  Administered 2020-10-01 – 2020-10-02 (×2): 500 mg via ORAL
  Filled 2020-10-01 (×2): qty 1

## 2020-10-01 NOTE — Progress Notes (Signed)
Subjective: Postpartum Day 3: Repeat Cesarean Delivery @ [redacted]w[redacted]d due to [facility] w/SIPE w/SF (HA) Patient reports nausea, vomiting, incisional pain, tolerating PO, and no problems voiding.  She denies any flatus or BM.   She reports right sided weakness overnight. Dr. Vergie Living assessed overnight and no objective weakness or deficits at that time.   Pain is better controlled. She reports emesis, but none in emesis bag or noted by nursing. She is tolerating PO and medications. She does have a history during this pregnancy of HEG.   She also notes headache.   Objective: Vital signs in last 24 hours: Temp:  [98.1 F (36.7 C)-98.3 F (36.8 C)] 98.3 F (36.8 C) (08/18 1121) Pulse Rate:  [68-91] 91 (08/18 1121) Resp:  [17-18] 18 (08/18 1121) BP: (120-146)/(62-93) 127/64 (08/18 1121) SpO2:  [95 %-100 %] 99 % (08/18 1121)  Physical Exam:  General: cooperative and mild distress Lochia: appropriate Uterine Fundus: firm Incision: healing well DVT Evaluation: No evidence of DVT seen on physical exam. No cords or calf tenderness. No significant calf/ankle edema. Abdomen: soft, appropriately tender, + BS  Recent Labs    09/28/20 2124 09/29/20 0417  HGB 9.7* 9.8*  HCT 28.5* 28.7*    Assessment/Plan: Status post Cesarean section. Postoperative course complicated by likely ileus   - Continue current care but encouraged ambulation to aid with likely ileus. Gum ordered - Discussed the impact of opioids on bowel function and nausea and encouraged minimal use - Bowel sounds much improved today so I suspect this is passing. Anticipate discharge tomorrow which we discussed.    CHTN with SIPE with HA - Now s/p Mag - Bps well controlled on Labetalol 200 TID. Will continue this regimen - Will do 1 wk BP check in the office following discharge   GDMA2 - Will resume Metformin for home  FWB - Baby in NICU doing well. Circ consent signed with pt and left with baby's chart in room.    Anemia of  Pregnancy  - Continue iron once home, but will not add at this time due to ileus  HA - Suspect this is not related to PreE. Will try compazine with benadryl. She has allergy to NSAIDs.   Right Weakness - Reviewed no concern at this time for symptoms of stroke based on physical exam and vital signs - Discussed we will have anesthesia come to evaluate to ensure not related to regional anesthesia although discussed this is unlikely.  - Reviewed once we have ruled out life threatening issues, then we may not find anything clear explanation but then would expect improvement with time.   Anticipate D/C home tomorrow which she and I discussed.   Milas Hock 10/01/2020, 11:45 AM

## 2020-10-01 NOTE — Addendum Note (Signed)
Addendum  created 10/01/20 2015 by Elmer Picker, MD   Clinical Note Signed

## 2020-10-01 NOTE — Lactation Note (Signed)
This note was copied from a baby's chart. Lactation Consultation Note  Patient Name: Leah Shea [id number] Date: 10/01/2020 Reason for consult: Follow-up assessment;NICU baby;Late-preterm 34-36.6wks Age:35 hours  RN on OB floor asked that lactation follow up with Leah Shea today due to inconsistent pumping. Leah Shea states that she's "not having a good day." I offered to support in anyway possible. She denied need for additional pumping supplies. Leah Shea was able to verbalize goal of pumping q3 hours. She states that she's trying to do this. She has not seen any changes in her milk today. I offered reassurance that the transition may occur on days 4-5. I encouraged her to continue to pump for stimulation purposes 8 times a day and to feed any EBM back to baby.  Leah Shea has been in contact with [facility] and plans to obtain her DEBP tomorrow after discharge.  Feeding Mother's Current Feeding Choice: Breast Milk and Donor Milk  Lactation Tools Discussed/Used Breast pump type: Double-Electric Breast Pump Pump Education: Setup, frequency, and cleaning Reason for Pumping: NICU Pumping frequency: recommended 8 times/day; q3 hours Pumped volume: 0 mL  Interventions Interventions: Education  Discharge    Consult Status Consult Status: Follow-up Follow-up type: In-patient    Walker Shadow 10/01/2020, 10:45 AM

## 2020-10-01 NOTE — Progress Notes (Signed)
OB anesthesiologist contacted by [facility] specialty care RN regarding new onset RLE weakness. Per nurse, OBGYN Dr. Para March requesting [place] to see the patient, however I am unable to get in touch with Dr. Para March herself. Attempted to see patient 20 minutes after I was contacted by phone, however she is off the floor in NICU.  Per nursing staff, she has been ambulating to and from NICU as well as the bathroom without problem. Given the fact that the patient had a spinal anesthetic 72 hours ago, it is extremely unlikely that her new onset RLE weakness is secondary to regional anesthesia. I would suggest pursuing other potential etiologies.   Will attempt to see patient again at a different time.

## 2020-10-01 NOTE — Progress Notes (Signed)
Called by primary team for RLE weakness. Patient says symptoms started yesterday evening. She describes right sided "tingling" on the entire right side of her body including face, arms, and legs. She says her right leg feels weak but she is able to ambulate without any difficulties. No symptoms on the left. She denies incontinence or saddle anesthesia. She also describes a right sided headache that starts beneath her right eye and goes to the center of her head. Not positional in nature. She says her symptoms are similar to those she has right before a seizure. No missed doses of antiepileptics. No focal neurological findings on exam. These symptoms likely not related to spinal placement. In addition, headache is not consistent with PDPH. Will defer to primary team for further workup.

## 2020-10-02 ENCOUNTER — Other Ambulatory Visit (HOSPITAL_COMMUNITY): Payer: Self-pay

## 2020-10-02 LAB — BPAM RBC
Blood Product Expiration Date: 202209072359
Blood Product Expiration Date: 202209072359
Unit Type and Rh: 6200
Unit Type and Rh: 6200

## 2020-10-02 LAB — TYPE AND SCREEN
ABO/RH(D): A POS
Antibody Screen: NEGATIVE
Unit division: 0
Unit division: 0

## 2020-10-02 MED ORDER — LORAZEPAM 0.5 MG PO TABS
0.5000 mg | ORAL_TABLET | Freq: Four times a day (QID) | ORAL | 0 refills | Status: DC | PRN
  Filled 2020-10-02: qty 30, 15d supply, fill #0

## 2020-10-02 MED ORDER — OXYCODONE HCL 5 MG PO TABS
5.0000 mg | ORAL_TABLET | ORAL | 0 refills | Status: DC | PRN
  Filled 2020-10-02: qty 30, 5d supply, fill #0

## 2020-10-02 MED ORDER — CERTAVITE/ANTIOXIDANTS PO TABS
1.0000 | ORAL_TABLET | Freq: Every day | ORAL | 2 refills | Status: AC
  Filled 2020-10-02: qty 30, 30d supply, fill #0

## 2020-10-02 MED ORDER — LABETALOL HCL 200 MG PO TABS
200.0000 mg | ORAL_TABLET | Freq: Three times a day (TID) | ORAL | 0 refills | Status: DC
  Filled 2020-10-02: qty 90, 30d supply, fill #0

## 2020-10-02 MED ORDER — ACETAMINOPHEN 500 MG PO TABS
1000.0000 mg | ORAL_TABLET | Freq: Four times a day (QID) | ORAL | 0 refills | Status: DC
  Filled 2020-10-02: qty 30, 4d supply, fill #0

## 2020-10-02 MED ORDER — METFORMIN HCL 500 MG PO TABS
500.0000 mg | ORAL_TABLET | Freq: Two times a day (BID) | ORAL | 0 refills | Status: DC
  Filled 2020-10-02: qty 60, 30d supply, fill #0

## 2020-10-02 NOTE — Lactation Note (Signed)
This note was copied from a baby's chart. Lactation Consultation Note  Patient Name: Leah Shea OMVEH'M Date: 10/02/2020 Reason for consult: Follow-up assessment;NICU baby;Late-preterm 34-36.6wks;Breast reduction;Maternal endocrine disorder Age:35 days  OB Specialy care RN Aurther Loft reported to [facility] that mom will be rooming in with baby in the NICU, mom was instructed to take all her pump parts with her (including the tubing).   Feeding Mother's Current Feeding Choice: Breast Milk and Donor Milk Nipple Type: Dr. Irving Burton Ultra Preemie (purple NFANT trialed but too fast)  Lactation Tools Discussed/Used Tools: Pump Breast pump type: Double-Electric Breast Pump Pump Education: Setup, frequency, and cleaning;Milk Storage Reason for Pumping: LPI in NICU Pumping frequency: q 3 hours Pumped volume:  (drops)  Interventions Interventions: Breast feeding basics reviewed;DEBP;Education  Discharge Discharge Education: Engorgement and breast care Pump: DEBP  Consult Status Consult Status: Follow-up Follow-up type: In-patient    Shilpa Bushee Venetia Constable 10/02/2020, 1:51 PM

## 2020-10-02 NOTE — Discharge Summary (Signed)
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 yo [id number] admitted to the hospital 09/28/2020 for scheduled cesarean section with the following indication: Preeclampsia with severe features at 34w by H .Delivery details are as follows:  Membrane Rupture Time/Date: 6:32 PM ,09/28/2020   Delivery Method:C-Section, Low Transverse  Details of operation can be found in separate operative note.  Patient had an uncomplicated postpartum course.  She is ambulating, tolerating a regular diet, passing flatus, and urinating well. Patient is discharged home in stable condition on  10/02/20        Newborn Data: Birth date:09/28/2020  Birth time:6:32 PM  Gender:Female  Living status:Living  Apgars:7 ,8  Weight:2080 g     Magnesium Sulfate received: Yes: Seizure prophylaxis BMZ received: No Rhophylac:N/A MMR:N/A T-DaP: Recommended Flu: N/A Transfusion:No  Physical exam  Vitals:   10/01/20 2228 10/01/20 2230 10/02/20 0407 10/02/20 0748  BP: 132/78  135/82 (!) 145/94  Pulse: 86  87 84  Resp: _0 Temp: 98.3 F (36.8 C)  98.1 F (36.7 C) 98 F (36.7 C)  TempSrc: Oral  Oral Oral  SpO2: 97% 97% 98%   Weight:      Height:        General: alert, cooperative, and no distress Lochia: appropriate Uterine Fundus: firm Incision: Healing well with no significant drainage, No significant erythema, Dressing is clean, dry, and intact DVT Evaluation: No evidence of DVT seen on physical exam. Negative Homan's sign. No significant calf/ankle edema. Abdomen: S/NT/ND, + BS  Labs: Lab Results  Component Value Date   WBC 13.9 (H) 09/29/2020   HGB 9.8 (L) 09/29/2020   HCT 28.7 (L) 09/29/2020   MCV 89.7 09/29/2020   PLT 155 09/29/2020   CMP Latest Ref Rng & Units 09/28/2020  Glucose 70 - 99 mg/dL 88  BUN 6 - 20 mg/dL 8  Creatinine 0.44 - 1.00 mg/dL 0.76  Sodium 135 - 145 mmol/L 134(L)  Potassium 3.5 - 5.1 mmol/L 3.5  Chloride 98 - 111 mmol/L 105  CO2 22 - 32 mmol/L 21(L)  Calcium 8.9 - 10.3 mg/dL 8.7(L)  Total Protein 6.5 - 8.1 g/dL 6.0(L)  Total Bilirubin 0.3 - 1.2 mg/dL 0.1(L)  Alkaline Phos 38 - 126 U/L 91  AST 15 - 41 U/L 13(L)  ALT 0 - 44 U/L 14   Edinburgh Score: Edinburgh Postnatal Depression Scale Screening Tool 09/30/2020  I have been able to laugh and see the funny side of things. 1  I have looked forward with enjoyment to things. 2  I have blamed myself  unnecessarily when things went wrong. 3  I have been anxious or worried for no good reason. 2  I have felt scared or panicky for no good reason. 3  Things have been getting on top of me. 2  I have been so unhappy that I have had difficulty sleeping. 2  I have felt sad or miserable. 2  I have been so unhappy that I have been crying. 2  The thought of harming myself has occurred to me. 0  Edinburgh Postnatal Depression Scale Total 19      After visit meds:  Allergies as of 10/02/2020       Reactions   Other Anaphylaxis, Hives   Mushrooms   Nsaids Hives, Swelling, Other (See Comments)   Body burns   Reglan [metoclopramide] Other (See Comments)   anxiety   Sulfa Antibiotics Other (See Comments)   burns        Medication List     STOP  taking these medications    Accu-Chek Guide test strip Generic drug: glucose blood   Accu-Chek Guide w/Device Kit   Accu-Chek Softclix Lancets lancets   Blood Pressure Kit Devi   diphenhydrAMINE 50 MG tablet Commonly known as: BENADRYL   Doxylamine-Pyridoxine 10-10 MG Tbec Commonly known as: Diclegis   folic acid 1 MG tablet Commonly known as: FOLVITE   Insulin Lispro Junior KwikPen 100 UNIT/ML KwikPen Commonly known as: HUMALOG JR   Insulin Pen Needle 31G X 6 MM Misc   Lantus SoloStar 100 UNIT/ML Solostar Pen Generic drug: insulin glargine   ondansetron 8 MG disintegrating tablet Commonly known as: Zofran ODT   promethazine 25 MG tablet Commonly known as: PHENERGAN   scopolamine 1 MG/3DAYS Commonly known as: TRANSDERM-SCOP   Transderm-Scop 1 MG/3DAYS Generic drug: scopolamine   Vitamin D (Ergocalciferol) 1.25 MG (50000 UNIT) Caps capsule Commonly known as: DRISDOL       TAKE these medications    acetaminophen 500 MG tablet Commonly known as: TYLENOL Take 2 tablets (1,000 mg total) by mouth every 6 (six) hours. What changed:  when to take this reasons to take this   busPIRone 15 MG tablet Commonly known as: BUSPAR Take by mouth.   cyclobenzaprine 10 MG tablet Commonly known as: FLEXERIL Take 1 tablet (10 mg total) by mouth 3 (three) times daily as needed (back pain/muscle pain).   escitalopram 10 MG tablet Commonly known as: LEXAPRO Take 1 tablet (10 mg total) by mouth daily.   hydrOXYzine 25 MG capsule Commonly known as: VISTARIL Take 2 capsules (50 mg total) by mouth 3 (three) times daily as needed for anxiety.   labetalol 200 MG tablet Commonly known as: NORMODYNE Take 1 tablet (200 mg total) by mouth 3 (three) times daily.   levETIRAcetam 500 MG tablet Commonly known as: KEPPRA Take 1 tablet (500 mg total) by mouth 2 (two) times daily.   LORazepam 0.5 MG tablet Commonly known as: ATIVAN Take 1 tablet (0.5 mg total) by mouth every 6  (six) hours as needed for anxiety or sleep.   metFORMIN 500 MG tablet Commonly known as: GLUCOPHAGE Take 1 tablet (500 mg total) by mouth 2 (two) times daily with a meal.   oxyCODONE 5 MG immediate release tablet Commonly known as: Oxy IR/ROXICODONE Take 1-2 tablets (5-10 mg total) by mouth every 4 (four) hours as needed for moderate pain.   pantoprazole 40 MG tablet Commonly known as: Protonix Take 1 tablet (40 mg total) by mouth daily.   prenatal multivitamin Tabs tablet  Take 1 tablet by mouth daily at 12 noon.               Discharge Care Instructions  (From admission, onward)           Start     Ordered   10/02/20 0000  Discharge wound care:       Comments: If dressing is present, remove after 7 days from surgery   10/02/20 0952             Discharge home in stable condition Infant Feeding: Breast Infant Disposition:NICU Discharge instruction: per After Visit Summary and Postpartum booklet. Activity: Advance as tolerated. Pelvic rest for 6 weeks.  Diet: carb modified diet and low salt diet Anticipated Birth Control: Unsure Postpartum Appointment:1 week Additional Postpartum F/U: Incision check 1 week and BP check 1 week Future Appointments:No future appointments. Follow up Visit:  [facility] Follow up in 1 week(s).   Specialty: Obstetrics and Gynecology Why: For blood pressure check and incision check Contact information: [address] [place] [phone]                    10/02/2020 Radene Gunning, MD

## 2020-10-02 NOTE — Lactation Note (Signed)
This note was copied from a baby's chart. Lactation Consultation Note  Patient Name: Boy Emily Massar OZHYQ'M Date: 10/02/2020 Reason for consult: Follow-up assessment;NICU baby;Late-preterm 34-36.6wks;Breast reduction;Maternal endocrine disorder Age:35 days  Visited with mom of 47 hours old LPI NICU female, she's getting discharged today. Reviewed discharge education, mom voiced that her [facility] appt is not till Monday (today is Friday). This LC called the [facility] office @ [facility] and left a message (no answer) @ the BF coordinator voicemail box; asking to reach out to this mom for pump issuance today.  LC also brought up the [facility] loaner to mom, but this will need to be approved by management since today is not a weekend, checked with [facility] charge RN. Mom has been pumping consistently every 3 hours but still no signs of the onset of secretory activation; probably due to Hx of breast surgery.  Plan of care:   Encouraged mom to continue pumping every 2-3 hours; at least 8 pumping sessions/24 hours Breast massage, hand expression and coconut oil were also encouraged prior pumping   FOB present. All questions and concerns answered, mom to follow up with NICU LC PRN.  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 Irving Burton Ultra Preemie (purple NFANT trialed but too fast)  Lactation Tools Discussed/Used Tools: Pump Breast pump type: Double-Electric Breast Pump Pump Education: Setup, frequency, and cleaning;Milk Storage Reason for Pumping: LPI in NICU Pumping frequency: q 3 hours Pumped volume:  (drops)  Interventions Interventions: Breast feeding basics reviewed;DEBP;Education  Discharge Discharge Education: Engorgement and breast care Pump:  DEBP  Consult Status Consult Status: Follow-up Follow-up type: In-patient    Jimmye Wisnieski Venetia Constable 10/02/2020, 1:13 PM

## 2020-10-02 NOTE — Plan of Care (Signed)
Patient to be discharged home with printed instructions. Leah Kareem L Deshaun Weisinger, RN  

## 2020-10-03 ENCOUNTER — Ambulatory Visit: Payer: Self-pay

## 2020-10-03 NOTE — Lactation Note (Signed)
This note was copied from a baby's chart. Lactation Consultation Note  Patient Name: Leah Shea GLOVF'[id number] Date: 10/03/2020 Reason for consult: Follow-up assessment;NICU baby;Late-preterm 34-36.6wks;Infant < 6lbs;Breast reduction;Maternal endocrine disorder Age:35 days  Visited with mom of 54 days old LPI NICU female, mom started taking baby to breast today for some lick & learn.  LC assisted with latching and positioning, encouraged mom to do STS on the next feeding. Baby has a small mouth and will only get the nipple, he's just "practicing at the breast" at this point, cueing, but only engaging in NNS. Baby was at the breast for 12 minutes  Reviewed normal LPI behavior with parents and how babies would transition and they get older. Mom voiced that baby "Leah Shea" is doing well with the bottle though and taking most of his oral feeds.  Plan of care:   Encouraged mom to start using the hand pump she got with there DEBP kit every 2-3 hours; at least 8 pumping sessions/24 hours Breast massage, hand expression and coconut oil were also encouraged prior pumping She'll continue taking baby to breast on feeding cues for some STS and lick & learn   FOB present. All questions and concerns answered, mom to follow up with NICU LC PRN.  Maternal Data   Mom's supply is BNL, she's at risk of low supply due to infrequent pumping/hand expression  Feeding Mother's Current Feeding Choice: Breast Milk and Donor Milk Nipple Type: Dr. Myra Gianotti Preemie  LATCH Score Latch: Repeated attempts needed to sustain latch, nipple held in mouth throughout feeding, stimulation needed to elicit sucking reflex.  Audible Swallowing: None  Type of Nipple: Everted at rest and after stimulation  Comfort (Breast/Nipple): Soft / non-tender  Hold (Positioning): Assistance needed to correctly position infant at breast and maintain latch.  LATCH Score: 6   Lactation Tools Discussed/Used Tools: Pump Breast pump  type: Double-Electric Breast Pump (at hospital only) Pump Education: Setup, frequency, and cleaning;Milk Storage Reason for Pumping: LPI in NICU Pumping frequency: 3-4 times/24 hours Pumped volume:  (still drops, mom didn't room in as she planned, she's only been doing hand expression)  Interventions Interventions: Breast feeding basics reviewed;Assisted with latch;Breast massage;Breast compression;Adjust position;Support pillows;DEBP  Discharge Pump: DEBP  Consult Status Consult Status: Follow-up Follow-up type: In-patient    Leah Shea 10/03/2020, 5:04 PM

## 2020-10-06 ENCOUNTER — Ambulatory Visit: Payer: No Typology Code available for payment source

## 2020-10-06 ENCOUNTER — Inpatient Hospital Stay (HOSPITAL_COMMUNITY): Payer: No Typology Code available for payment source

## 2020-10-06 ENCOUNTER — Encounter (HOSPITAL_COMMUNITY): Payer: Self-pay | Admitting: Obstetrics and Gynecology

## 2020-10-06 ENCOUNTER — Ambulatory Visit: Payer: Self-pay

## 2020-10-06 ENCOUNTER — Inpatient Hospital Stay (HOSPITAL_COMMUNITY)
Admission: AD | Admit: 2020-10-06 | Discharge: 2020-10-14 | DRG: 769 | Disposition: A | Discharge status: Home / Self Care | Payer: No Typology Code available for payment source | Attending: Obstetrics and Gynecology | Admitting: Obstetrics and Gynecology

## 2020-10-06 ENCOUNTER — Other Ambulatory Visit: Payer: Self-pay

## 2020-10-06 DIAGNOSIS — O10919 Unspecified pre-existing hypertension complicating pregnancy, unspecified trimester: Secondary | ICD-10-CM

## 2020-10-06 DIAGNOSIS — O900 Disruption of cesarean delivery wound: Principal | ICD-10-CM | POA: Diagnosis present

## 2020-10-06 DIAGNOSIS — K439 Ventral hernia without obstruction or gangrene: Secondary | ICD-10-CM

## 2020-10-06 DIAGNOSIS — O24414 Gestational diabetes mellitus in pregnancy, insulin controlled: Secondary | ICD-10-CM

## 2020-10-06 DIAGNOSIS — D573 Sickle-cell trait: Secondary | ICD-10-CM | POA: Diagnosis present

## 2020-10-06 DIAGNOSIS — O0990 Supervision of high risk pregnancy, unspecified, unspecified trimester: Secondary | ICD-10-CM

## 2020-10-06 DIAGNOSIS — F4310 Post-traumatic stress disorder, unspecified: Secondary | ICD-10-CM | POA: Diagnosis present

## 2020-10-06 DIAGNOSIS — O24435 Gestational diabetes mellitus in puerperium, controlled by oral hypoglycemic drugs: Secondary | ICD-10-CM | POA: Diagnosis present

## 2020-10-06 DIAGNOSIS — O9903 Anemia complicating the puerperium: Secondary | ICD-10-CM | POA: Diagnosis present

## 2020-10-06 DIAGNOSIS — T819XXA Unspecified complication of procedure, initial encounter: Secondary | ICD-10-CM | POA: Diagnosis present

## 2020-10-06 DIAGNOSIS — N852 Hypertrophy of uterus: Secondary | ICD-10-CM

## 2020-10-06 DIAGNOSIS — O1093 Unspecified pre-existing hypertension complicating the puerperium: Secondary | ICD-10-CM | POA: Diagnosis not present

## 2020-10-06 DIAGNOSIS — G40909 Epilepsy, unspecified, not intractable, without status epilepticus: Secondary | ICD-10-CM | POA: Diagnosis present

## 2020-10-06 DIAGNOSIS — R1011 Right upper quadrant pain: Secondary | ICD-10-CM | POA: Diagnosis not present

## 2020-10-06 DIAGNOSIS — O9963 Diseases of the digestive system complicating the puerperium: Secondary | ICD-10-CM | POA: Diagnosis not present

## 2020-10-06 DIAGNOSIS — Z87891 Personal history of nicotine dependence: Secondary | ICD-10-CM

## 2020-10-06 DIAGNOSIS — O99345 Other mental disorders complicating the puerperium: Secondary | ICD-10-CM | POA: Diagnosis present

## 2020-10-06 DIAGNOSIS — O99355 Diseases of the nervous system complicating the puerperium: Secondary | ICD-10-CM | POA: Diagnosis present

## 2020-10-06 DIAGNOSIS — K9189 Other postprocedural complications and disorders of digestive system: Secondary | ICD-10-CM | POA: Diagnosis not present

## 2020-10-06 DIAGNOSIS — Z20822 Contact with and (suspected) exposure to covid-19: Secondary | ICD-10-CM | POA: Diagnosis present

## 2020-10-06 DIAGNOSIS — Z3A27 27 weeks gestation of pregnancy: Secondary | ICD-10-CM

## 2020-10-06 DIAGNOSIS — G8918 Other acute postprocedural pain: Secondary | ICD-10-CM

## 2020-10-06 DIAGNOSIS — O1003 Pre-existing essential hypertension complicating the puerperium: Secondary | ICD-10-CM | POA: Diagnosis present

## 2020-10-06 LAB — CBC
HCT: 31.9 % — ABNORMAL LOW (ref 36.0–46.0)
Hemoglobin: 10.8 g/dL — ABNORMAL LOW (ref 12.0–15.0)
MCH: 30.4 pg (ref 26.0–34.0)
MCHC: 33.9 g/dL (ref 30.0–36.0)
MCV: 89.9 fL (ref 80.0–100.0)
Platelets: 247 10*3/uL (ref 150–400)
RBC: 3.55 MIL/uL — ABNORMAL LOW (ref 3.87–5.11)
RDW: 12.5 % (ref 11.5–15.5)
WBC: 8.5 10*3/uL (ref 4.0–10.5)
nRBC: 0 % (ref 0.0–0.2)

## 2020-10-06 LAB — COMPREHENSIVE METABOLIC PANEL
ALT: 16 U/L (ref 0–44)
AST: 14 U/L — ABNORMAL LOW (ref 15–41)
Albumin: 2.9 g/dL — ABNORMAL LOW (ref 3.5–5.0)
Alkaline Phosphatase: 81 U/L (ref 38–126)
Anion gap: 9 (ref 5–15)
BUN: 12 mg/dL (ref 6–20)
CO2: 24 mmol/L (ref 22–32)
Calcium: 9.6 mg/dL (ref 8.9–10.3)
Chloride: 105 mmol/L (ref 98–111)
Creatinine, Ser: 0.83 mg/dL (ref 0.44–1.00)
GFR, Estimated: >60 mL/min (ref >60)
Glucose, Bld: 96 mg/dL (ref 70–99)
Potassium: 3.7 mmol/L (ref 3.5–5.1)
Sodium: 138 mmol/L (ref 135–145)
Total Bilirubin: 0.5 mg/dL (ref 0.3–1.2)
Total Protein: 6.2 g/dL — ABNORMAL LOW (ref 6.5–8.1)

## 2020-10-06 IMAGING — US US ABDOMEN COMPLETE
1 series · 15 of 25 positions shown · non-contrast
Comparison: None.

CLINICAL DATA: Postoperative pain. Enlarged uterus. Mid abdominal
and right upper quadrant pain. Status post Caesarean section 1 week
ago. Question rectus sheath hematoma.

EXAM:
ABDOMEN ULTRASOUND COMPLETE

[Series 1: us abdomen complete · 15 of 91 slices shown]
[im 1/91]
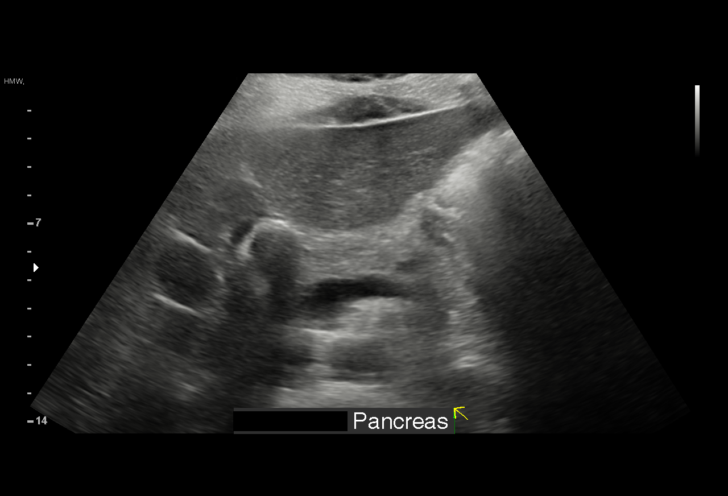
[im 8/91]
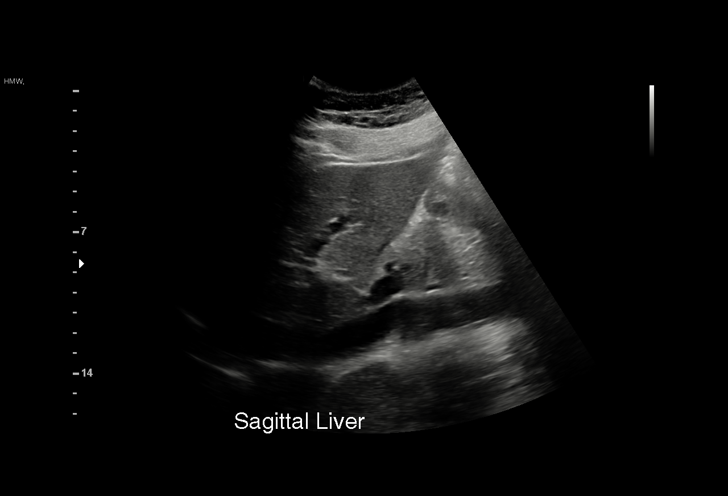
[im 16/91]
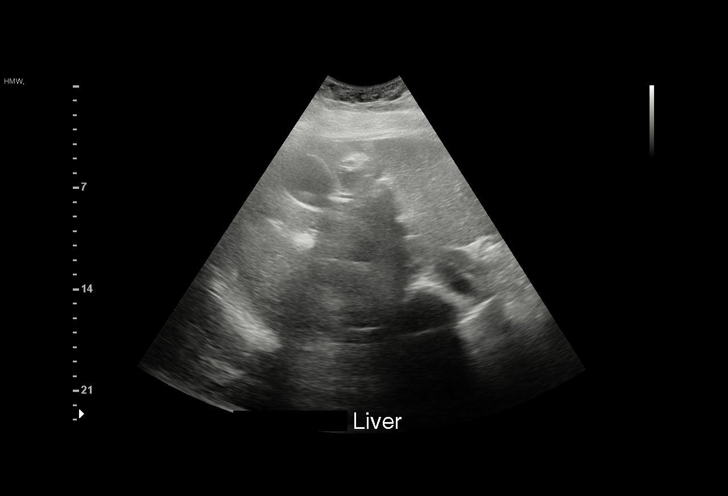
[im 19/91]
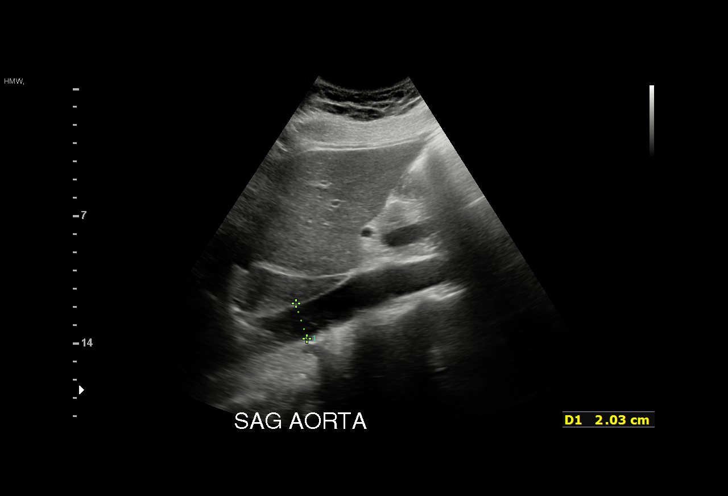
[im 27/91]
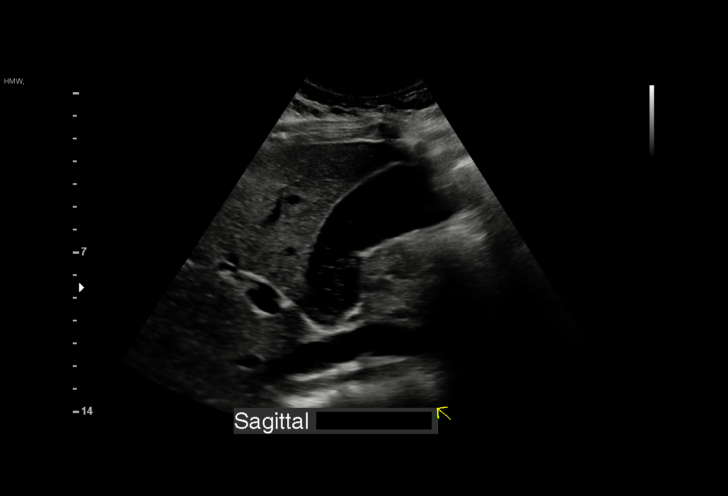
[im 34/91]
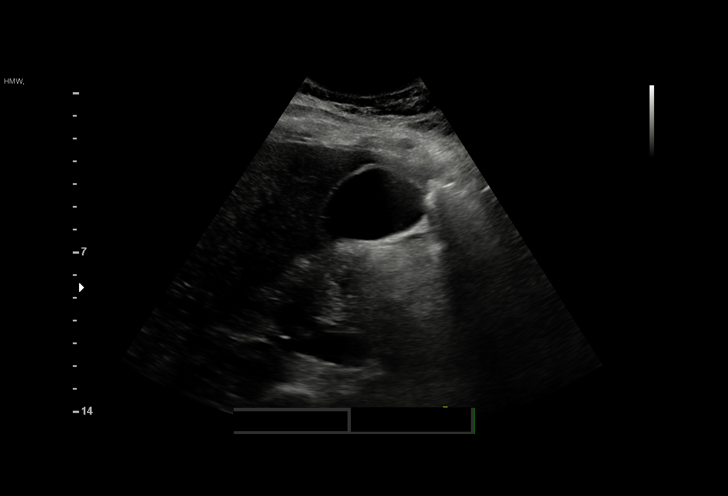
[im 38/91]
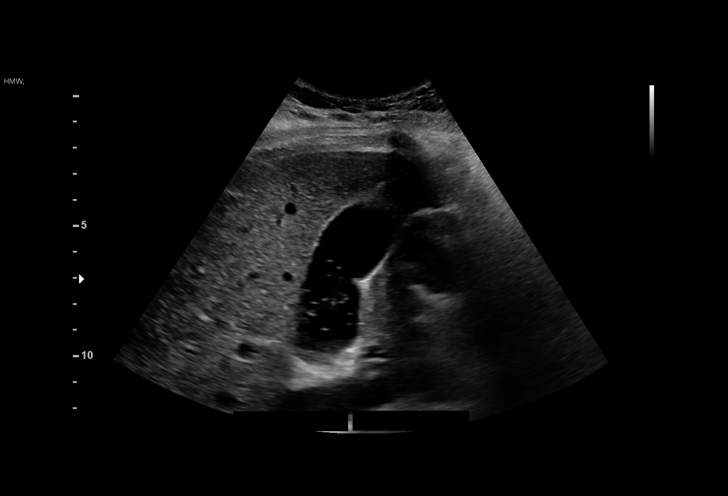
[im 46/91]
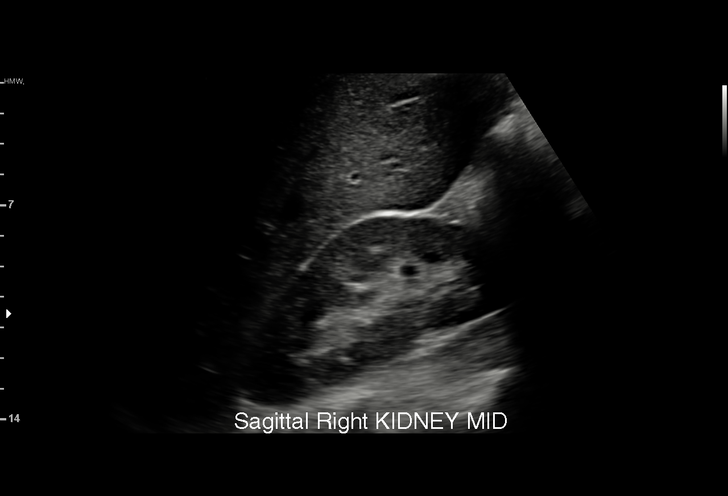
[im 53/91]
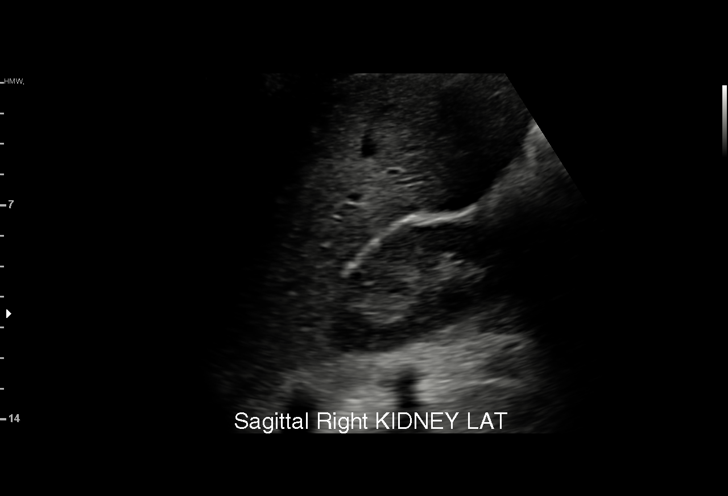
[im 57/91]
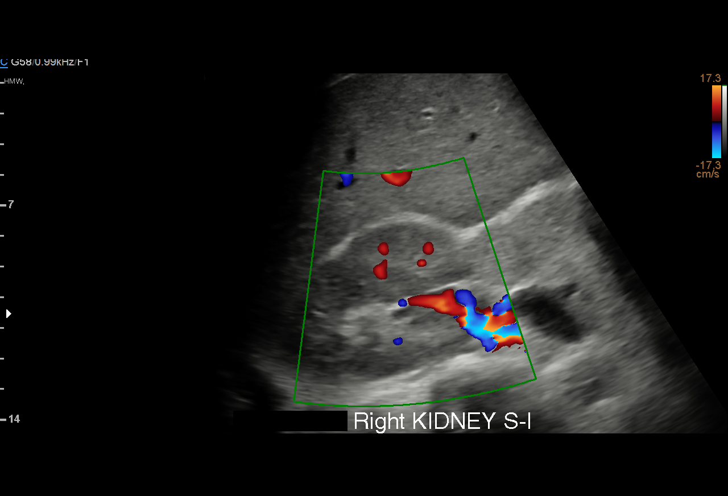
[im 64/91]
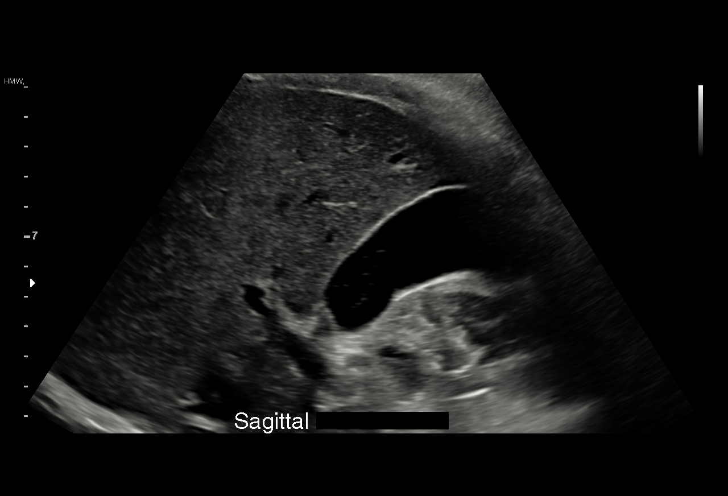
[im 72/91]
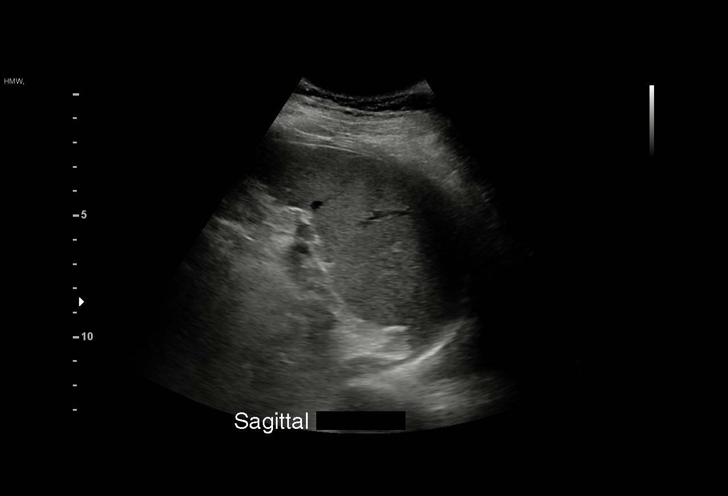
[im 76/91]
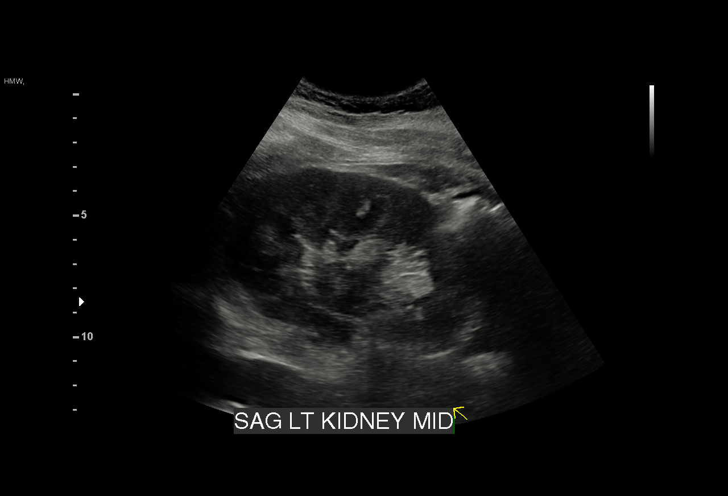
[im 83/91]
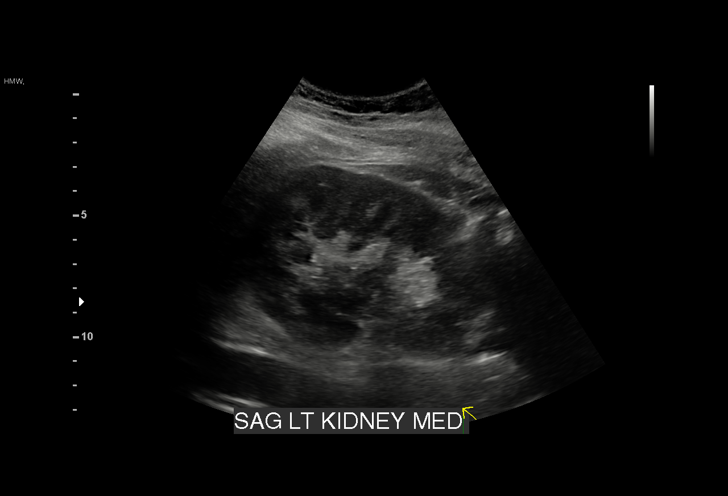
[im 91/91]
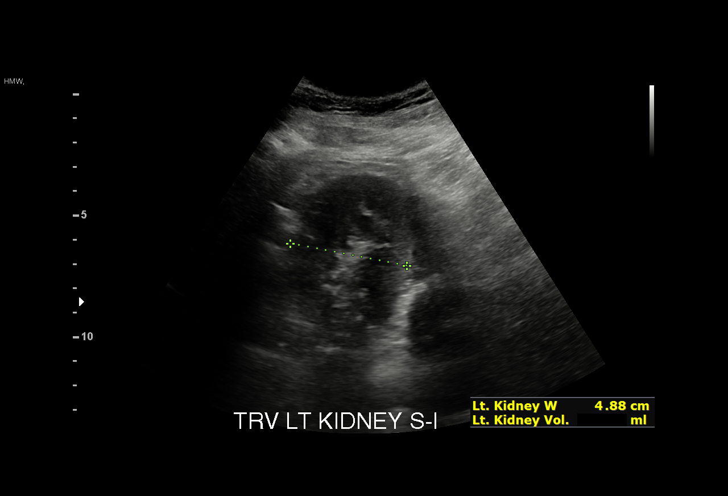

[15 of 25 positions shown; findings below may reference images not displayed]

FINDINGS: Gallbladder: Physiologically distended containing intraluminal
sludge. No shadowing stones. No gallbladder wall thickening. No
pericholecystic fluid. Positive sonographic Murphy sign noted by
sonographer.

Common bile duct: Diameter: 5-6 mm, normal.

Liver: No focal lesion identified. Within normal limits in
parenchymal echogenicity. Portal vein is patent on color Doppler
imaging with normal direction of blood flow towards the liver.

IVC: No abnormality visualized.

Pancreas: Visualized portion unremarkable.

Spleen: Size and appearance within normal limits.

Right Kidney: Length: 10.4 cm. Normal parenchymal echogenicity. No
hydronephrosis. No visualized stone or focal lesion.

Left Kidney: Length: 10.5 cm. Normal parenchymal echogenicity. No
hydronephrosis. No visualized stone or focal lesion.

Abdominal aorta: No aneurysm visualized.

Other findings: No free fluid or ascites. Please note that the right
rectus sheath was not imaged by ultrasound.
IMPRESSION: 1. Gallbladder sludge. Technologist notes a positive sonographic
Murphy sign. This is of unknown significance, as there are no other
findings of acute cholecystitis.
2. No biliary dilatation.
3. Otherwise unremarkable abdominal ultrasound. The right rectus
sheath and abdominal wall were not imaged for this exam.

## 2020-10-06 MED ORDER — LACTATED RINGERS IV SOLN: Freq: Once | INTRAVENOUS | Status: AC

## 2020-10-06 MED ORDER — HYDROMORPHONE HCL 1 MG/ML IJ SOLN
1.0000 mg | Freq: Once | INTRAMUSCULAR | Status: AC
  Administered 2020-10-06: 1 mg via INTRAVENOUS
  Filled 2020-10-06: qty 1

## 2020-10-06 NOTE — Lactation Note (Signed)
This note was copied from a baby's chart. Lactation Consultation Note  Patient Name: Leah Shea [id number] Date: 10/06/2020 Reason for consult: Follow-up assessment;Breast reduction;NICU baby;Late-preterm 34-36.6wks;Infant < 6lbs;Maternal endocrine disorder Age:35 days  Visited with mom of 44 62/4 weeks old LPI NICU female, she called LC for assistance because she had a questions on her supply. Her supply has dwindled even more event though she's been pumping consistently.  Mom will be going to MAU tonight due to high blood pressure and persistent bleeding/clots; asked her to check with physician regarding possible placenta fragments that could be delaying the onset of lactogenesis II.  Mom working with bottle feeding in the meantime with baby "Leah Shea", and reported baby is finishing up most of oral feeds. Baby just finished up a bottle of Similac 24 calorie formula of 39 ml when entered the room.  Plan of care:   Encouraged mom to continue pumping consistently, at least 8 times/24 hours. She'll keep NICU [facility] posted regarding her visit in MAU. Breast massage, hand expression and coconut oil were also encouraged prior pumping She'll continue taking baby to breast on feeding cues for some STS and lick & learn   No other support person at this time. All questions and concerns answered, mom to follow up with NICU LC PRN.  Maternal Data   Mom's milk supply is BNL, it could be due a combination of factors, she's still pumping consistently though  Feeding Mother's Current Feeding Choice: Breast Milk and Formula Nipple Type: Dr. Levert Feinstein Preemie  Lactation Tools Discussed/Used Tools: Pump Breast pump type: Double-Electric Breast Pump Pump Education: Setup, frequency, and cleaning;Milk Storage Reason for Pumping: LPI in NICU Pumping frequency: q 3-4 hours Pumped volume: 4 mL  Interventions Interventions: Breast feeding basics reviewed;DEBP;Education  Discharge Pump: DEBP  Consult  Status Consult Status: Follow-up Follow-up type: In-patient    Leah Shea Venetia Constable 10/06/2020, 7:59 PM

## 2020-10-06 NOTE — MAU Note (Addendum)
PT SAYS SHE DEL BY C/S ON 8-15 WENT HOME ON Friday - 8-19 HIGH BP DURING PNC HAD C/S BC BP WAS HIGH.  SENT HOME WITH MEDS- HAS BEEN TAKING MEDS TONIGHT - WAS IN NICU  WITH BABY-  WHILE THERE STILL HAS H/A- TOOK TYLENOL AT 430PM- XS 2 TABS - ALSO TOOK OXYCODONE 1 TAB   VISION- SPOTS, HAS GUSHES OF BRIGHT RED BLEEDING - NOT CONSTANT LACTATION CONSULTANT TOLD TO COME TO MAU   DIZZY WHEN SHE STANDS,  HAS SHARP PAIN IN RIGHT RIBS AND RIGHT SIDE OF UMBILICUS  PT IS PUMPING AND SUPPLY HAS REALLY DECREASED HAS FOCAL SEIZURES- LAST ONE WAS Friday - S/S- HAS SMELLS, VISION,H/A- SHE THEN ZONES OUT.

## 2020-10-06 NOTE — MAU Provider Note (Addendum)
Chief Complaint:  Headache and Hypertension   Event Date/Time   First Provider Initiated Contact with Patient 10/06/20 2138      HPI: Leah Shea is a 35 y.o. [id number] who presents to maternity admissions reporting abdominal pain in RUQ/lower rib area and just below umbilicus. States has been there since surgery and is unrelieved by analgesics.  Also has headache, unrelieved by meds.  Took Tylenol and 1 Oxy at 4pm.  . She reports vaginal bleeding, but no vaginal itching/burning, urinary symptoms, n/v, or fever/chills.    Headache  This is a recurrent problem. The problem occurs constantly. The problem has been unchanged. The pain quality is similar to prior headaches. The quality of the pain is described as aching. The pain is at a severity of 7/10. Associated symptoms include abdominal pain, dizziness and photophobia. Pertinent negatives include no blurred vision, fever, seizures (last one Friday), sore throat or vomiting. She has tried acetaminophen and oral narcotics for the symptoms. The treatment provided no relief. Her past medical history is significant for hypertension.  Hypertension This is a recurrent problem. Associated symptoms include headaches. Pertinent negatives include no blurred vision. There are no associated agents to hypertension. Treatments tried: Labetealol.   RN Note: PT SAYS SHE DEL BY C/S ON 8-15 WENT HOME ON Friday - 8-19 HIGH BP DURING PNC HAD C/S BC BP WAS HIGH.  SENT HOME WITH MEDS- HAS BEEN TAKING MEDS TONIGHT - WAS IN NICU  WITH BABY-  WHILE THERE STILL HAS H/A- TOOK TYLENOL AT 430PM- XS 2 TABS - ALSO TOOK OXYCODONE 1 TAB    VISION- SPOTS, HAS GUSHES OF BRIGHT RED BLEEDING - NOT CONSTANT LACTATION CONSULTANT TOLD TO COME TO MAU   DIZZY WHEN SHE STANDS,  HAS SHARP PAIN IN RIGHT RIBS AND RIGHT SIDE OF UMBILICUS  PT IS PUMPING AND SUPPLY HAS REALLY DECREASED HAS FOCAL SEIZURES- LAST ONE WAS Friday - S/S- HAS SMELLS, VISION,H/A- SHE THEN ZONES  OUT.  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   Transferred from DOD/[facility] problem list   PTSD (post-traumatic stress disorder)    Shortness of breath    with exertion or panic attack   Sickle cell trait (HCC)    Sleep apnea    awaiting a sleep study to be done at [place]    Past obstetric history: OB History  Gravida Para Term Preterm AB Living  7 5 2 3 2 5   SAB IAB Ectopic Multiple Live Births  2     0 5    # Outcome Date GA Lbr Len/2nd Weight Sex Delivery Anes PTL Lv  7 Preterm 09/28/20 [redacted]w[redacted]d  2080 g M CS-LTranv Spinal  LIV  6 Preterm 11/01/12 [redacted]w[redacted]d   M CS-LTranv   LIV  5 Preterm 06/04/10 [redacted]w[redacted]d   M CS-LTranv   LIV  4 Term 01/21/09    M CS-LTranv   LIV  3 Term 12/26/04    F Vag-Spont   LIV  2 SAB           1 SAB  Past Surgical History: Past Surgical History:  Procedure Laterality Date   BREAST SURGERY Bilateral    reductions   CESAREAN SECTION  2010, 2012,2014   x 3   CESAREAN SECTION N/A 09/28/2020   Procedure: CESAREAN SECTION;  Surgeon: Venora Maples, MD;  Location: [facility] LD ORS;  Service: Obstetrics;  Laterality: N/A;   EYE SURGERY     x 10 as a child   PILONIDAL CYST EXCISION  2020   TONSILLECTOMY N/A 10/07/2013   Procedure: TONSILLECTOMY;  Surgeon: Christia Reading, MD;  [facility];  Service: ENT;  Laterality: N/A;   TUMOR REMOVAL     tumor removed from back    Family History: Family History  Problem Relation Age of Onset   Breast cancer Mother    Breast cancer Maternal Aunt 36       metastatic   Cancer Maternal Uncle        unk type   Breast cancer Maternal Grandmother    Prostate cancer Maternal Grandfather         metastatic   Cancer Maternal Aunt        either breast or ovarian     Social History: Social History   Tobacco Use   Smoking status: Former    Packs/day: 0.25    Years: 1.00    Pack years: 0.25    Types: Cigarettes    Quit date: 07/02/2013    Years since quitting: 7.2   Smokeless tobacco: Never  Vaping Use   Vaping Use: Never used  Substance Use Topics   Alcohol use: Not Currently   Drug use: Not Currently    Types: Marijuana    Comment: last used January 3    Allergies:  Allergies  Allergen Reactions   Other Anaphylaxis and Hives    Mushrooms   Nsaids Hives, Swelling and Other (See Comments)    Body burns    Reglan [Metoclopramide] Other (See Comments)    anxiety   Sulfa Antibiotics Other (See Comments)    burns    Meds:  Medications Prior to Admission  Medication Sig Dispense Refill Last Dose   acetaminophen (TYLENOL) 500 MG tablet Take 2 tablets (1,000 mg total) by mouth every 6 (six) hours. 30 tablet 0 10/06/2020   busPIRone (BUSPAR) 15 MG tablet Take by mouth.   10/06/2020   cyclobenzaprine (FLEXERIL) 10 MG tablet Take 1 tablet (10 mg total) by mouth 3 (three) times daily as needed (back pain/muscle pain). 30 tablet 0 Past Week   escitalopram (LEXAPRO) 10 MG tablet Take 1 tablet (10 mg total) by mouth daily. 30 tablet 3 10/05/2020   hydrOXYzine (VISTARIL) 25 MG capsule Take 2 capsules (50 mg total) by mouth 3 (three) times daily as needed for anxiety. 60 capsule 2 10/05/2020   labetalol (NORMODYNE) 200 MG tablet Take 1 tablet (200 mg total) by mouth 3 (three) times daily. 90 tablet 0 10/06/2020 at 12 NOON   levETIRAcetam (KEPPRA) 500 MG tablet Take 1 tablet (500 mg total) by mouth 2 (two) times daily. 60 tablet 2 10/06/2020   LORazepam (ATIVAN) 0.5 MG tablet Take 1 tablet (0.5 mg total) by mouth every 6 (six) hours as needed for anxiety or sleep. 30 tablet 0 10/05/2020   metFORMIN (GLUCOPHAGE) 500 MG tablet Take 1 tablet (500 mg total) by mouth 2 (two) times daily with  a meal. 60 tablet 0 10/06/2020   Multiple Vitamins-Minerals (CERTAVITE/ANTIOXIDANTS) TABS Take 1 tablet by mouth daily at 12 noon. 30 tablet 2  10/06/2020   oxyCODONE (OXY IR/ROXICODONE) 5 MG immediate release tablet Take 1-2 tablets (5-10 mg total) by mouth every 4 (four) hours as needed for moderate pain. 30 tablet 0 10/06/2020   pantoprazole (PROTONIX) 40 MG tablet Take 1 tablet (40 mg total) by mouth daily. 30 tablet 6 10/06/2020    I have reviewed patient's Past Medical Hx, Surgical Hx, Family Hx, Social Hx, medications and allergies.  ROS:  Review of Systems  Constitutional:  Negative for fever.  HENT:  Negative for sore throat.   Eyes:  Positive for photophobia. Negative for blurred vision.  Gastrointestinal:  Positive for abdominal pain. Negative for vomiting.  Neurological:  Positive for dizziness and headaches. Negative for seizures (last one Friday).  Other systems negative     Physical Exam  Patient Vitals for the past 24 hrs:  BP Temp Temp src Pulse Resp Height Weight  10/06/20 2113 (!) 131/97 98.6 F (37 C) Oral 93 20 5' 6.5" (1.689 m) 91.4 kg   Constitutional: Well-developed, well-nourished female in no acute distress.  Tearful. Cardiovascular: normal rate and rhythm Respiratory: normal effort, no distress.  GI: Abd soft, tender throughout.  Nondistended, but there is a large firm mass at umbilicus,   Tender.  Dressing intact..  No rebound, Mild guarding.  MS: Extremities nontender, no edema, normal ROM Neurologic: Alert and oriented x 4.   Grossly nonfocal. GU: Neg CVAT. Skin:  Warm and Dry Psych:  Affect appropriate.  PELVIC EXAM: deferred   Labs: Results for orders placed or performed during the hospital encounter of 10/06/20 (from the past 24 hour(s))  CBC     Status: Abnormal   Collection Time: 10/06/20 10:10 PM  Result Value Ref Range   WBC 8.5 4.0 - 10.5 K/uL   RBC 3.55 (L) 3.87 - 5.11 MIL/uL   Hemoglobin 10.8 (L) 12.0 - 15.0 g/dL   HCT 16.131.9 (L) 09.636.0 -  46.0 %   MCV 89.9 80.0 - 100.0 fL   MCH 30.4 26.0 - 34.0 pg   MCHC 33.9 30.0 - 36.0 g/dL   RDW 04.512.5 40.911.5 - 81.115.5 %   Platelets 247 150 - 400 K/uL   nRBC 0.0 0.0 - 0.2 %  Comprehensive metabolic panel     Status: Abnormal   Collection Time: 10/06/20 10:10 PM  Result Value Ref Range   Sodium 138 135 - 145 mmol/L   Potassium 3.7 3.5 - 5.1 mmol/L   Chloride 105 98 - 111 mmol/L   CO2 24 22 - 32 mmol/L   Glucose, Bld 96 70 - 99 mg/dL   BUN 12 6 - 20 mg/dL   Creatinine, Ser 9.140.83 0.44 - 1.00 mg/dL   Calcium 9.6 8.9 - 78.210.3 mg/dL   Total Protein 6.2 (L) 6.5 - 8.1 g/dL   Albumin 2.9 (L) 3.5 - 5.0 g/dL   AST 14 (L) 15 - 41 U/L   ALT 16 0 - 44 U/L   Alkaline Phosphatase 81 38 - 126 U/L   Total Bilirubin 0.5 0.3 - 1.2 mg/dL   GFR, Estimated >95>60 >62>60 mL/min   Anion gap 9 5 - 15    --/--/A POS (08/15 1224)  Imaging:  CT ABDOMEN PELVIS WO CONTRAST  Result Date: 10/07/2020 CLINICAL DATA:  Abdominal pain.  Concern for hernia. EXAM: CT ABDOMEN AND PELVIS WITHOUT CONTRAST TECHNIQUE: Multidetector CT imaging of the abdomen and pelvis was performed following the standard protocol without IV contrast. COMPARISON:  CT abdomen pelvis dated 12/24/2018. FINDINGS: Evaluation of this exam  is limited in the absence of intravenous contrast. Lower chest: The visualized lung bases are clear. No intra-abdominal free air or free fluid. Hepatobiliary: No focal liver abnormality is seen. No gallstones, gallbladder wall thickening, or biliary dilatation. Pancreas: Unremarkable. No pancreatic ductal dilatation or surrounding inflammatory changes. Spleen: Normal in size without focal abnormality. Adrenals/Urinary Tract: The adrenal glands are unremarkable. Kidneys, visualized ureters, and the urinary bladder appear unremarkable. Stomach/Bowel: There is moderate stool throughout the colon. Several scattered colonic diverticula without active inflammatory changes. There is no bowel obstruction or active inflammation. The  appendix is normal. Vascular/Lymphatic: Abdominal aorta and IVC are unremarkable. No portal venous gas. There is no adenopathy. Reproductive: The uterus is anteverted and appears somewhat enlarged. Faint uterine fibroids noted. No adnexal masses. Other: There is a 5 cm defect in the anterior pelvic wall with herniation of a short segment of the transverse colon and small amount of omental fat. Mild haziness of the herniated fat may be reactive. Clinical correlation is recommended to evaluate for possibility of incarceration. No fluid collection. There is a 15 mm prominent nodular density along the right hernia wall (60/3) of indeterminate etiology. There is diffuse stranding of the anterior lower abdominal and pelvic subcutaneous soft tissues as well as stranding and thickening of the periumbilical subcutaneous fat. No fluid collection. Musculoskeletal: No acute osseous pathology. IMPRESSION: 1. Anterior pelvic wall defect with herniation of a short segment of the transverse colon and small amount of omental fat. Mild haziness of the herniated fat may be reactive. Clinical correlation is recommended to evaluate for possibility of incarceration. No fluid collection. 2. Diffuse stranding of the anterior lower abdominal and pelvic subcutaneous soft tissues as well as stranding and thickening of the periumbilical subcutaneous fat. No fluid collection. 3. Colonic diverticulosis. No bowel obstruction. Normal appendix. Electronically Signed   By: Arash  Radparvar M.D.   On: 10/07/2020 00:31   US Abdomen Complete  Result Date: 10/06/2020 CLINICAL DATA:  Postoperative pain. Enlarged uterus. Mid abdominal and right upper quadrant pain. Status post Caesarean section 1 week ago. Question rectus sheath hematoma. EXAM: ABDOMEN ULTRASOUND COMPLETE COMPARISON:  None. FINDINGS: Gallbladder: Physiologically distended containing intraluminal sludge. No shadowing stones. No gallbladder wall thickening. No pericholecystic fluid.  Positive sonographic Murphy sign noted by sonographer. Common bile duct: Diameter: 5-6 mm, normal. Liver: No focal lesion identified. Within normal limits in parenchymal echogenicity. Portal vein is patent on color Doppler imaging with normal direction of blood flow towards the liver. IVC: No abnormality visualized. Pancreas: Visualized portion unremarkable. Spleen: Size and appearance within normal limits. Right Kidney: Length: 10.4 cm. Normal parenchymal echogenicity. No hydronephrosis. No visualized stone or focal lesion. Left Kidney: Length: 10.5 cm. Normal parenchymal echogenicity. No hydronephrosis. No visualized stone or focal lesion. Abdominal aorta: No aneurysm visualized. Other findings: No free fluid or ascites. Please note that the right rectus sheath was not imaged by ultrasound. IMPRESSION: 1. Gallbladder sludge. Technologist notes a positive sonographic Murphy sign. This is of unknown significance, as there are no other findings of acute cholecystitis. 2. No biliary dilatation. 3. Otherwise unremarkable abdominal ultrasound. The right rectus sheath and abdominal wall were not imaged for this exam. Electronically Signed   By: Melanie  Sanford M.D.   On: 10/06/2020 23:35     MAU Course/MDM: I have ordered labs as follows:  CBC and CMET Imaging ordered: Abdominal US which showed uterus to be involuted, so CT was ordered to further assess abdominal wall.  This showed a pelvic wall hernia with   possible incarcerated transverse colon and omentum.  Results reviewed.   Consult Dr Constant who ordered a surgical consult.  Called Dr Eric Wilson who agreed to come and evaluate the patient. .   Has been NPO since afternoon.  Treatments in MAU included Dilaudid x 1mg and Zofran x 4mg..   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 Will need to increase Labetalol to 300mg bid after discharge MD to follow  Zyron Deeley CNM, MSN Certified Nurse-Midwife 10/06/2020 9:38 PM  

## 2020-10-07 ENCOUNTER — Encounter (HOSPITAL_COMMUNITY): Payer: Self-pay | Admitting: Obstetrics and Gynecology

## 2020-10-07 ENCOUNTER — Inpatient Hospital Stay (HOSPITAL_COMMUNITY): Payer: No Typology Code available for payment source

## 2020-10-07 DIAGNOSIS — D573 Sickle-cell trait: Secondary | ICD-10-CM | POA: Diagnosis not present

## 2020-10-07 DIAGNOSIS — O24435 Gestational diabetes mellitus in puerperium, controlled by oral hypoglycemic drugs: Secondary | ICD-10-CM | POA: Diagnosis not present

## 2020-10-07 DIAGNOSIS — K439 Ventral hernia without obstruction or gangrene: Secondary | ICD-10-CM

## 2020-10-07 DIAGNOSIS — O9963 Diseases of the digestive system complicating the puerperium: Secondary | ICD-10-CM | POA: Diagnosis not present

## 2020-10-07 DIAGNOSIS — O1003 Pre-existing essential hypertension complicating the puerperium: Secondary | ICD-10-CM | POA: Diagnosis not present

## 2020-10-07 DIAGNOSIS — O10919 Unspecified pre-existing hypertension complicating pregnancy, unspecified trimester: Secondary | ICD-10-CM | POA: Diagnosis not present

## 2020-10-07 DIAGNOSIS — O24414 Gestational diabetes mellitus in pregnancy, insulin controlled: Secondary | ICD-10-CM | POA: Diagnosis not present

## 2020-10-07 DIAGNOSIS — O1093 Unspecified pre-existing hypertension complicating the puerperium: Secondary | ICD-10-CM

## 2020-10-07 DIAGNOSIS — O9903 Anemia complicating the puerperium: Secondary | ICD-10-CM | POA: Diagnosis not present

## 2020-10-07 DIAGNOSIS — R1011 Right upper quadrant pain: Secondary | ICD-10-CM | POA: Diagnosis present

## 2020-10-07 DIAGNOSIS — Z20822 Contact with and (suspected) exposure to covid-19: Secondary | ICD-10-CM | POA: Diagnosis not present

## 2020-10-07 DIAGNOSIS — O99345 Other mental disorders complicating the puerperium: Secondary | ICD-10-CM | POA: Diagnosis not present

## 2020-10-07 DIAGNOSIS — T819XXA Unspecified complication of procedure, initial encounter: Secondary | ICD-10-CM | POA: Diagnosis present

## 2020-10-07 DIAGNOSIS — O99355 Diseases of the nervous system complicating the puerperium: Secondary | ICD-10-CM | POA: Diagnosis not present

## 2020-10-07 DIAGNOSIS — F4310 Post-traumatic stress disorder, unspecified: Secondary | ICD-10-CM | POA: Diagnosis not present

## 2020-10-07 DIAGNOSIS — G40909 Epilepsy, unspecified, not intractable, without status epilepticus: Secondary | ICD-10-CM | POA: Diagnosis not present

## 2020-10-07 DIAGNOSIS — O900 Disruption of cesarean delivery wound: Secondary | ICD-10-CM | POA: Diagnosis not present

## 2020-10-07 DIAGNOSIS — K9189 Other postprocedural complications and disorders of digestive system: Secondary | ICD-10-CM | POA: Diagnosis not present

## 2020-10-07 DIAGNOSIS — Z87891 Personal history of nicotine dependence: Secondary | ICD-10-CM | POA: Diagnosis not present

## 2020-10-07 LAB — PREALBUMIN: Prealbumin: 17 mg/dL — ABNORMAL LOW (ref 18–38)

## 2020-10-07 LAB — TYPE AND SCREEN
ABO/RH(D): A POS
Antibody Screen: NEGATIVE

## 2020-10-07 LAB — RESP PANEL BY RT-PCR (FLU A&B, COVID) ARPGX2
Influenza A by PCR: NEGATIVE
Influenza B by PCR: NEGATIVE
SARS Coronavirus 2 by RT PCR: NEGATIVE

## 2020-10-07 IMAGING — CT CT ABD-PELV W/O CM
2 of 4 series · 16 of 46 positions shown, 18 images · non-contrast
Comparison: CT abdomen pelvis dated [DATE].

CLINICAL DATA: Abdominal pain.  Concern for hernia.

EXAM:
CT ABDOMEN AND PELVIS WITHOUT CONTRAST
TECHNIQUE: Multidetector CT imaging of the abdomen and pelvis was performed
following the standard protocol without IV contrast.

[Series 3: a/p w/o 5mm · axial · non-contrast · 0.96mm/px · z∈[+974,+1409]mm · 13 of 95 slices shown, 15 images]
[im 4/95  soft-tissue]
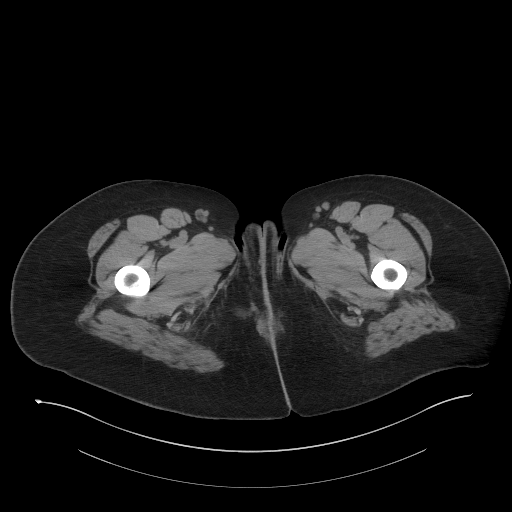
[im 4/95  bone]
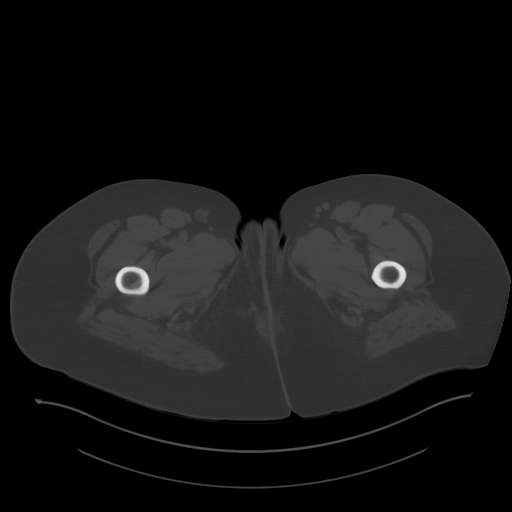
[im 12/95  soft-tissue]
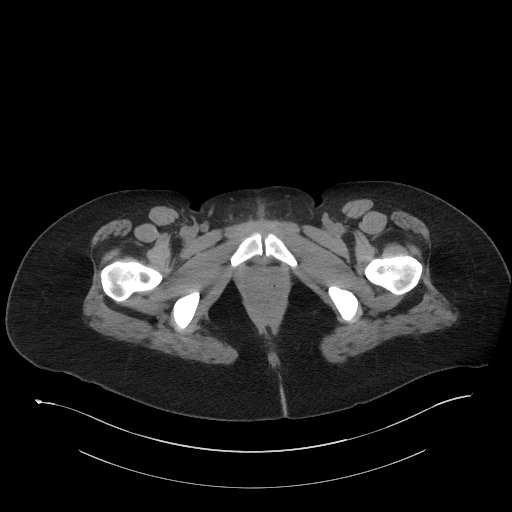
[im 19/95  soft-tissue]
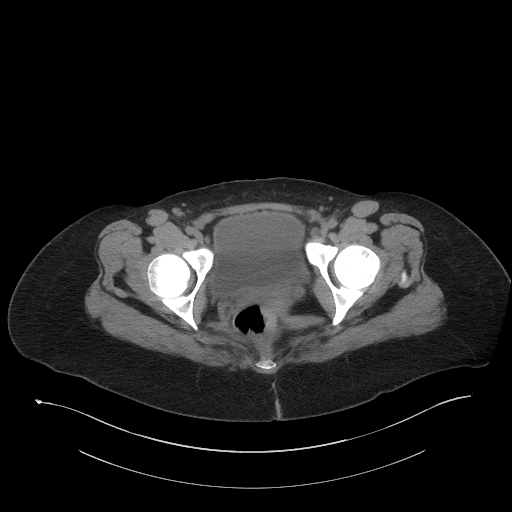
[im 27/95  soft-tissue]
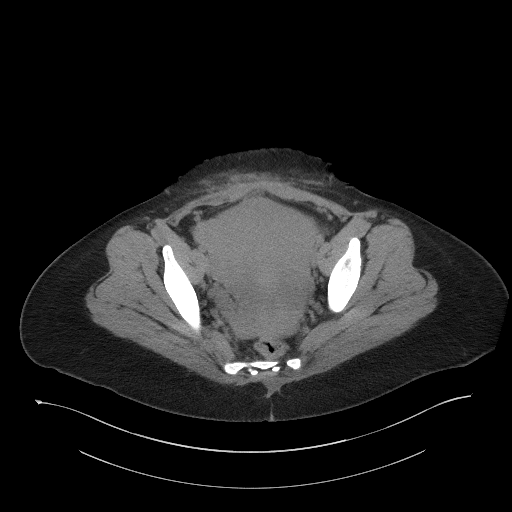
[im 34/95  soft-tissue]
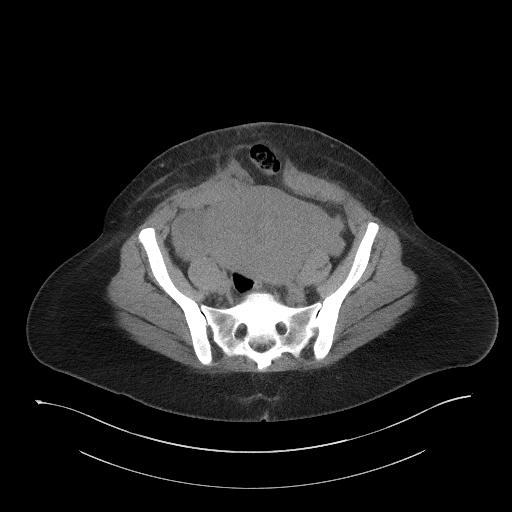
[im 42/95  soft-tissue]
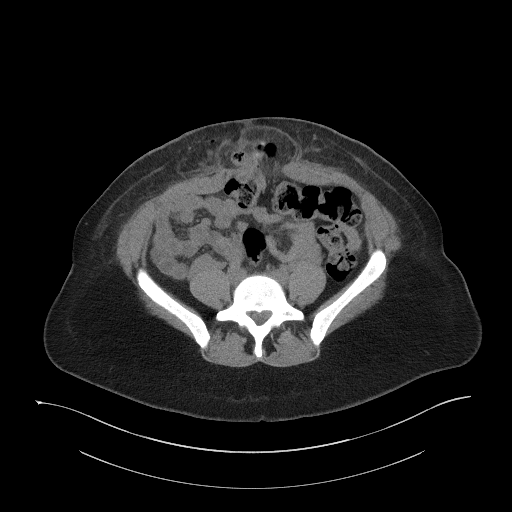
[im 49/95  soft-tissue]
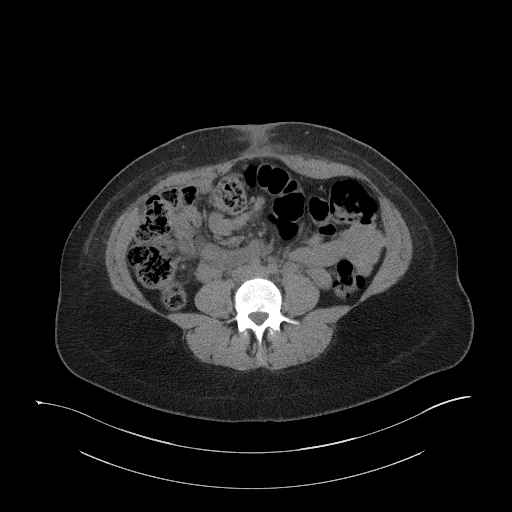
[im 53/95  soft-tissue]
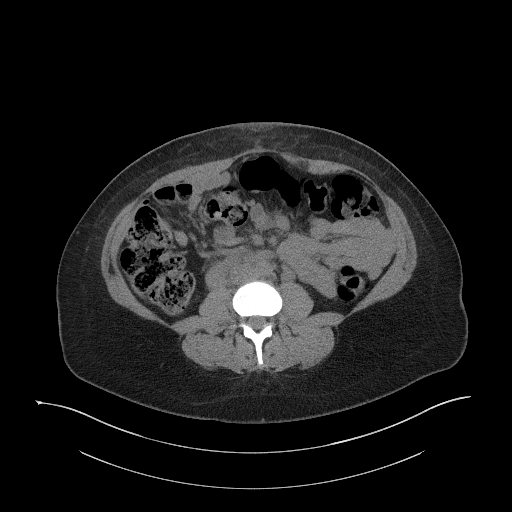
[im 61/95  soft-tissue]
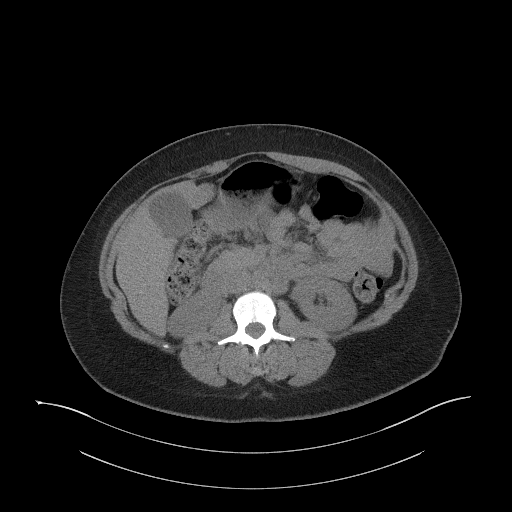
[im 61/95  bone]
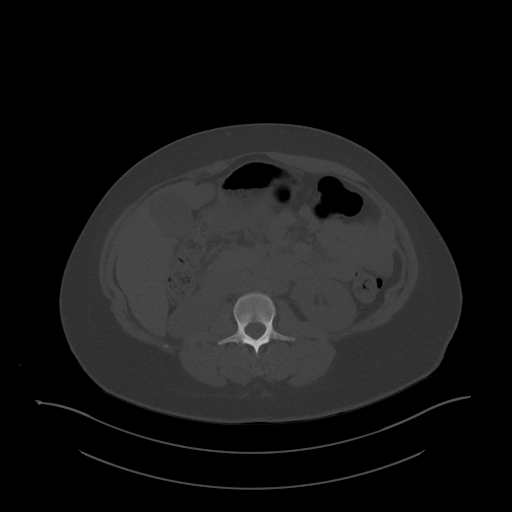
[im 68/95  soft-tissue]
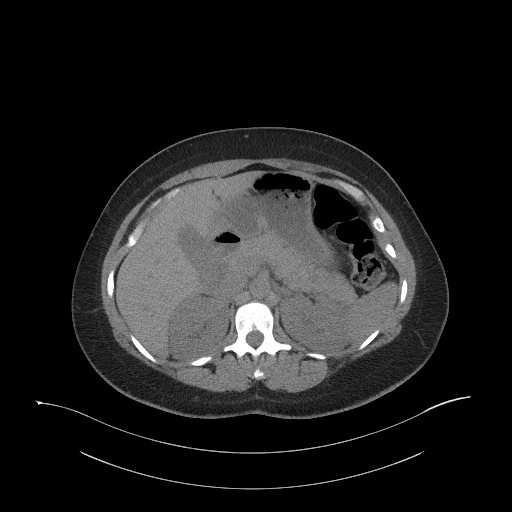
[im 76/95  soft-tissue]
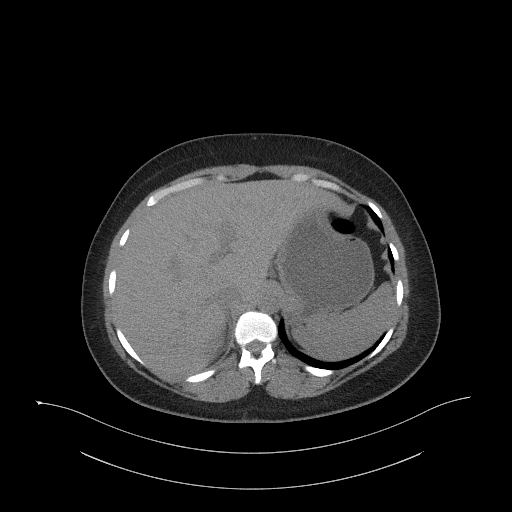
[im 83/95  soft-tissue]
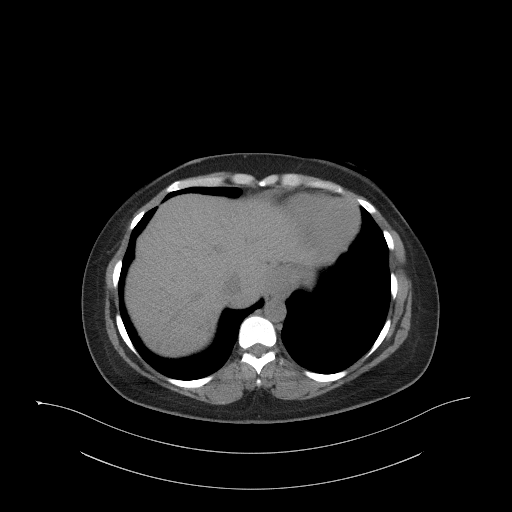
[im 91/95  soft-tissue]
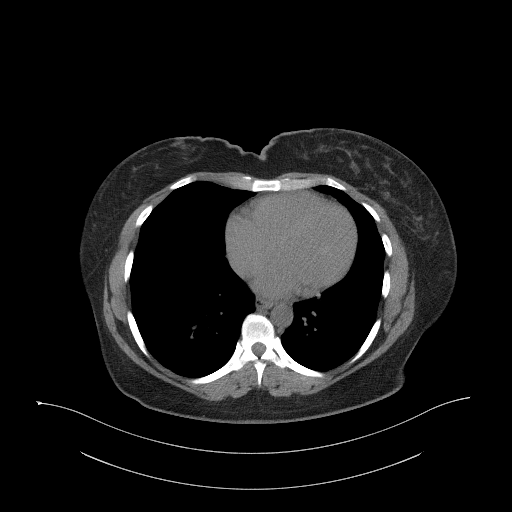

[Series 6: a/p w/o cor · coronal · non-contrast · 0.77mm/px · 3 of 151 slices shown]
[im 51/151  soft-tissue]
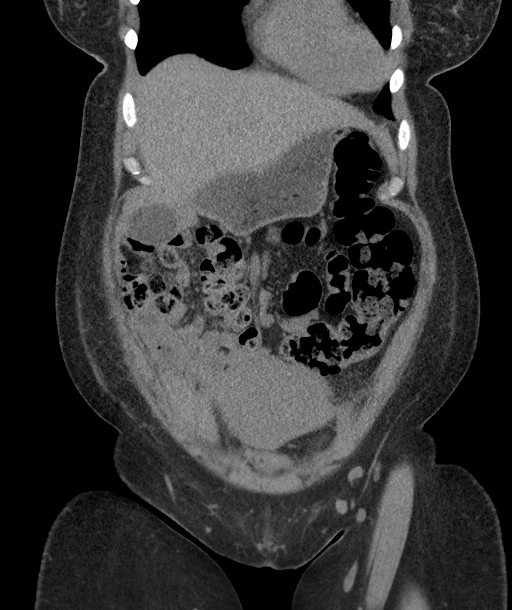
[im 67/151  soft-tissue]
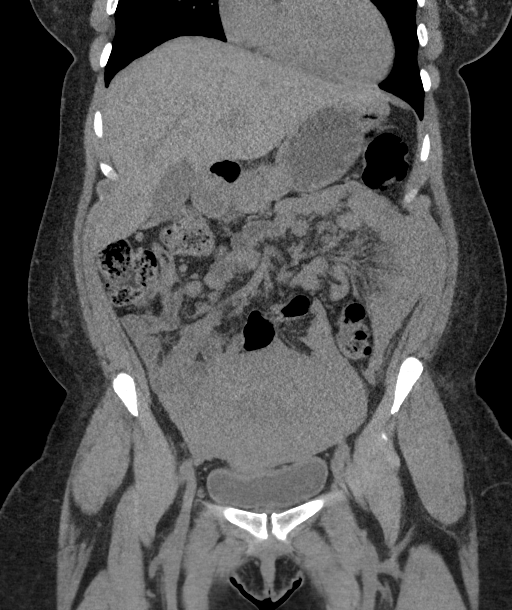
[im 84/151  soft-tissue]
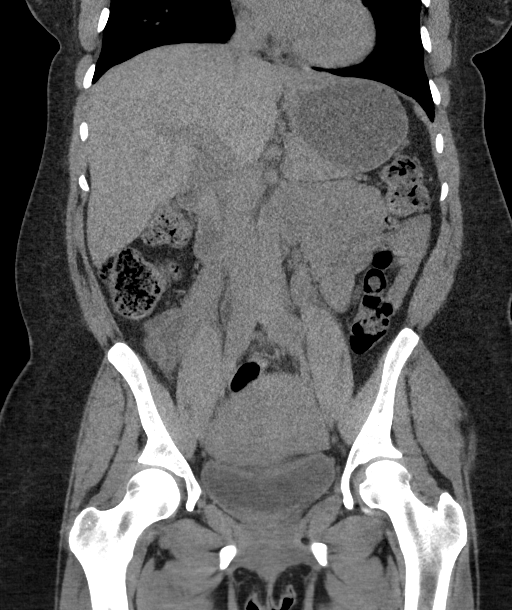

[16 of 46 positions shown; findings below may reference images not displayed]

FINDINGS: Evaluation of this exam is limited in the absence of intravenous
contrast.

Lower chest: The visualized lung bases are clear.

No intra-abdominal free air or free fluid.

Hepatobiliary: No focal liver abnormality is seen. No gallstones,
gallbladder wall thickening, or biliary dilatation.

Pancreas: Unremarkable. No pancreatic ductal dilatation or
surrounding inflammatory changes.

Spleen: Normal in size without focal abnormality.

Adrenals/Urinary Tract: The adrenal glands are unremarkable.
Kidneys, visualized ureters, and the urinary bladder appear
unremarkable.

Stomach/Bowel: There is moderate stool throughout the colon. Several
scattered colonic diverticula without active inflammatory changes.
There is no bowel obstruction or active inflammation. The appendix
is normal.

Vascular/Lymphatic: Abdominal aorta and IVC are unremarkable. No
portal venous gas. There is no adenopathy.

Reproductive: The uterus is anteverted and appears somewhat
enlarged. Faint uterine fibroids noted. No adnexal masses.

Other: There is a 5 cm defect in the anterior pelvic wall with
herniation of a short segment of the transverse colon and small
amount of omental fat. Mild haziness of the herniated fat may be
reactive. Clinical correlation is recommended to evaluate for
possibility of incarceration. No fluid collection. There is a 15 mm
prominent nodular density along the right hernia wall (60/3) of
indeterminate etiology.

There is diffuse stranding of the anterior lower abdominal and
pelvic subcutaneous soft tissues as well as stranding and thickening
of the periumbilical subcutaneous fat. No fluid collection.

Musculoskeletal: No acute osseous pathology.
IMPRESSION: 1. Anterior pelvic wall defect with herniation of a short segment of
the transverse colon and small amount of omental fat. Mild haziness
of the herniated fat may be reactive. Clinical correlation is
recommended to evaluate for possibility of incarceration. No fluid
collection.
2. Diffuse stranding of the anterior lower abdominal and pelvic
subcutaneous soft tissues as well as stranding and thickening of the
periumbilical subcutaneous fat. No fluid collection.
3. Colonic diverticulosis. No bowel obstruction. Normal appendix.

## 2020-10-07 MED ORDER — LACTATED RINGERS IV SOLN: INTRAVENOUS | Status: DC

## 2020-10-07 MED ORDER — ACETAMINOPHEN 10 MG/ML IV SOLN
1000.0000 mg | Freq: Once | INTRAVENOUS | Status: AC
  Administered 2020-10-07: 1000 mg via INTRAVENOUS
  Filled 2020-10-07 (×2): qty 100

## 2020-10-07 MED ORDER — METFORMIN HCL 500 MG PO TABS
500.0000 mg | ORAL_TABLET | Freq: Two times a day (BID) | ORAL | Status: DC
  Administered 2020-10-08 – 2020-10-14 (×12): 500 mg via ORAL
  Filled 2020-10-07 (×12): qty 1

## 2020-10-07 MED ORDER — BUSPIRONE HCL 15 MG PO TABS
15.0000 mg | ORAL_TABLET | Freq: Every day | ORAL | Status: DC
  Administered 2020-10-07 – 2020-10-13 (×7): 15 mg via ORAL
  Filled 2020-10-07 (×8): qty 1

## 2020-10-07 MED ORDER — CALCIUM CARBONATE ANTACID 500 MG PO CHEW: 2.0000 | CHEWABLE_TABLET | ORAL | Status: DC | PRN

## 2020-10-07 MED ORDER — ESCITALOPRAM OXALATE 10 MG PO TABS
10.0000 mg | ORAL_TABLET | Freq: Every day | ORAL | Status: DC
  Administered 2020-10-07 – 2020-10-13 (×7): 10 mg via ORAL
  Filled 2020-10-07 (×8): qty 1

## 2020-10-07 MED ORDER — LABETALOL HCL 200 MG PO TABS: 200.0000 mg | ORAL_TABLET | Freq: Two times a day (BID) | ORAL | Status: DC

## 2020-10-07 MED ORDER — HYDROMORPHONE HCL 1 MG/ML IJ SOLN
1.0000 mg | Freq: Once | INTRAMUSCULAR | Status: AC
Start: 2020-10-07 — End: 2020-10-07
  Administered 2020-10-07: 1 mg via INTRAVENOUS
  Filled 2020-10-07: qty 1

## 2020-10-07 MED ORDER — PANTOPRAZOLE SODIUM 40 MG PO TBEC
40.0000 mg | DELAYED_RELEASE_TABLET | Freq: Every day | ORAL | Status: DC
  Administered 2020-10-07 – 2020-10-14 (×7): 40 mg via ORAL
  Filled 2020-10-07 (×8): qty 1

## 2020-10-07 MED ORDER — LORAZEPAM 0.5 MG PO TABS
0.5000 mg | ORAL_TABLET | Freq: Four times a day (QID) | ORAL | Status: DC | PRN
  Administered 2020-10-07 – 2020-10-09 (×2): 0.5 mg via ORAL
  Filled 2020-10-07 (×2): qty 1

## 2020-10-07 MED ORDER — PRENATAL MULTIVITAMIN CH
1.0000 | ORAL_TABLET | Freq: Every day | ORAL | Status: DC
  Administered 2020-10-09 – 2020-10-14 (×5): 1 via ORAL
  Filled 2020-10-07 (×6): qty 1

## 2020-10-07 MED ORDER — LEVETIRACETAM 500 MG PO TABS
500.0000 mg | ORAL_TABLET | Freq: Two times a day (BID) | ORAL | Status: DC
  Administered 2020-10-07 – 2020-10-14 (×14): 500 mg via ORAL
  Filled 2020-10-07 (×16): qty 1

## 2020-10-07 MED ORDER — NIFEDIPINE ER OSMOTIC RELEASE 30 MG PO TB24
30.0000 mg | ORAL_TABLET | Freq: Two times a day (BID) | ORAL | Status: DC
  Administered 2020-10-07 – 2020-10-14 (×15): 30 mg via ORAL
  Filled 2020-10-07 (×15): qty 1

## 2020-10-07 MED ORDER — CEFAZOLIN SODIUM-DEXTROSE 2-4 GM/100ML-% IV SOLN
2.0000 g | INTRAVENOUS | Status: AC
  Filled 2020-10-07: qty 100

## 2020-10-07 MED ORDER — SODIUM CHLORIDE 0.9 % IV SOLN
25.0000 mg | Freq: Four times a day (QID) | INTRAVENOUS | Status: DC | PRN
  Administered 2020-10-10 – 2020-10-14 (×8): 25 mg via INTRAVENOUS
  Filled 2020-10-07 (×5): qty 1
  Filled 2020-10-07: qty 25
  Filled 2020-10-07 (×6): qty 1

## 2020-10-07 MED ORDER — ONDANSETRON HCL 4 MG/2ML IJ SOLN
4.0000 mg | Freq: Once | INTRAMUSCULAR | Status: AC
  Administered 2020-10-07: 4 mg via INTRAVENOUS
  Filled 2020-10-07: qty 2

## 2020-10-07 MED ORDER — ONDANSETRON HCL 4 MG/2ML IJ SOLN
4.0000 mg | Freq: Four times a day (QID) | INTRAMUSCULAR | Status: DC | PRN
  Administered 2020-10-07 – 2020-10-14 (×19): 4 mg via INTRAVENOUS
  Filled 2020-10-07 (×20): qty 2

## 2020-10-07 MED ORDER — OXYCODONE HCL 5 MG PO TABS
5.0000 mg | ORAL_TABLET | ORAL | Status: DC | PRN
  Administered 2020-10-07 – 2020-10-09 (×4): 10 mg via ORAL
  Administered 2020-10-09: 5 mg via ORAL
  Administered 2020-10-09 (×2): 10 mg via ORAL
  Administered 2020-10-10 (×2): 5 mg via ORAL
  Filled 2020-10-07 (×3): qty 2
  Filled 2020-10-07: qty 1
  Filled 2020-10-07: qty 2
  Filled 2020-10-07: qty 1
  Filled 2020-10-07: qty 2
  Filled 2020-10-07: qty 1
  Filled 2020-10-07 (×3): qty 2

## 2020-10-07 MED ORDER — HYDROMORPHONE HCL 1 MG/ML IJ SOLN
1.0000 mg | INTRAMUSCULAR | Status: DC | PRN
  Administered 2020-10-07 (×2): 1 mg via INTRAVENOUS
  Filled 2020-10-07 (×2): qty 1

## 2020-10-07 MED ORDER — DOCUSATE SODIUM 100 MG PO CAPS
100.0000 mg | ORAL_CAPSULE | Freq: Every day | ORAL | Status: DC
  Administered 2020-10-09: 100 mg via ORAL
  Filled 2020-10-07: qty 1

## 2020-10-07 MED ORDER — ACETAMINOPHEN 325 MG PO TABS: 650.0000 mg | ORAL_TABLET | ORAL | Status: DC | PRN

## 2020-10-07 MED ORDER — HYDROMORPHONE HCL 1 MG/ML IJ SOLN
1.0000 mg | INTRAMUSCULAR | Status: DC | PRN
  Administered 2020-10-07 – 2020-10-08 (×2): 1 mg via INTRAVENOUS
  Filled 2020-10-07 (×3): qty 1

## 2020-10-07 NOTE — H&P (View-Only) (Signed)
Subjective: CC: Patient with continued pain of her lower abdomen. She reports she is nauseated and threw up this morning around 8am.   Objective: Vital signs in last 24 hours: Temp:  [98.5 F (36.9 C)-98.6 F (37 C)] 98.5 F (36.9 C) (08/24 0753) Pulse Rate:  [78-98] 84 (08/24 0753) Resp:  [18-20] 18 (08/24 0753) BP: (131-151)/(78-109) 139/91 (08/24 0753) SpO2:  [98 %] 98 % (08/24 0753) Weight:  [91.4 kg] 91.4 kg (08/23 2113)    Intake/Output from previous day: No intake/output data recorded. Intake/Output this shift: No intake/output data recorded.  PE: Gen:  Alert, NAD, pleasant Pulm:  Normal rate and effort  Abd: Abd soft, bulge in lower midline which is tender to palpation but no overlying cellulitis, honeycomb dressing of her Pfannenstiel incision; no palpable hepatosplenomegaly Psych: A&Ox3  Skin: no rashes noted, warm and dry  Lab Results:  Recent Labs    10/06/20 2210  WBC 8.5  HGB 10.8*  HCT 31.9*  PLT 247   BMET Recent Labs    10/06/20 2210  NA 138  K 3.7  CL 105  CO2 24  GLUCOSE 96  BUN 12  CREATININE 0.83  CALCIUM 9.6   PT/INR No results for input(s): LABPROT, INR in the last 72 hours. CMP     Component Value Date/Time   NA 138 10/06/2020 2210   K 3.7 10/06/2020 2210   CL 105 10/06/2020 2210   CO2 24 10/06/2020 2210   GLUCOSE 96 10/06/2020 2210   BUN 12 10/06/2020 2210   CREATININE 0.83 10/06/2020 2210   CALCIUM 9.6 10/06/2020 2210   PROT 6.2 (L) 10/06/2020 2210   ALBUMIN 2.9 (L) 10/06/2020 2210   AST 14 (L) 10/06/2020 2210   ALT 16 10/06/2020 2210   ALKPHOS 81 10/06/2020 2210   BILITOT 0.5 10/06/2020 2210   GFRNONAA >60 10/06/2020 2210   GFRAA >60 02/08/2019 1418   Lipase     Component Value Date/Time   LIPASE 23 02/08/2019 1418    Studies/Results: CT ABDOMEN PELVIS WO CONTRAST  Result Date: 10/07/2020 CLINICAL DATA:  Abdominal pain.  Concern for hernia. EXAM: CT ABDOMEN AND PELVIS WITHOUT CONTRAST TECHNIQUE:  Multidetector CT imaging of the abdomen and pelvis was performed following the standard protocol without IV contrast. COMPARISON:  CT abdomen pelvis dated 12/24/2018. FINDINGS: Evaluation of this exam is limited in the absence of intravenous contrast. Lower chest: The visualized lung bases are clear. No intra-abdominal free air or free fluid. Hepatobiliary: No focal liver abnormality is seen. No gallstones, gallbladder wall thickening, or biliary dilatation. Pancreas: Unremarkable. No pancreatic ductal dilatation or surrounding inflammatory changes. Spleen: Normal in size without focal abnormality. Adrenals/Urinary Tract: The adrenal glands are unremarkable. Kidneys, visualized ureters, and the urinary bladder appear unremarkable. Stomach/Bowel: There is moderate stool throughout the colon. Several scattered colonic diverticula without active inflammatory changes. There is no bowel obstruction or active inflammation. The appendix is normal. Vascular/Lymphatic: Abdominal aorta and IVC are unremarkable. No portal venous gas. There is no adenopathy. Reproductive: The uterus is anteverted and appears somewhat enlarged. Faint uterine fibroids noted. No adnexal masses. Other: There is a 5 cm defect in the anterior pelvic wall with herniation of a short segment of the transverse colon and small amount of omental fat. Mild haziness of the herniated fat may be reactive. Clinical correlation is recommended to evaluate for possibility of incarceration. No fluid collection. There is a 15 mm prominent nodular density along the right hernia wall (60/3)  of indeterminate etiology. There is diffuse stranding of the anterior lower abdominal and pelvic subcutaneous soft tissues as well as stranding and thickening of the periumbilical subcutaneous fat. No fluid collection. Musculoskeletal: No acute osseous pathology. IMPRESSION: 1. Anterior pelvic wall defect with herniation of a short segment of the transverse colon and small amount  of omental fat. Mild haziness of the herniated fat may be reactive. Clinical correlation is recommended to evaluate for possibility of incarceration. No fluid collection. 2. Diffuse stranding of the anterior lower abdominal and pelvic subcutaneous soft tissues as well as stranding and thickening of the periumbilical subcutaneous fat. No fluid collection. 3. Colonic diverticulosis. No bowel obstruction. Normal appendix. Electronically Signed   By: Elgie Collard M.D.   On: 10/07/2020 00:31   US Abdomen Complete  Result Date: 10/06/2020 CLINICAL DATA:  Postoperative pain. Enlarged uterus. Mid abdominal and right upper quadrant pain. Status post Caesarean section 1 week ago. Question rectus sheath hematoma. EXAM: ABDOMEN ULTRASOUND COMPLETE COMPARISON:  None. FINDINGS: Gallbladder: Physiologically distended containing intraluminal sludge. No shadowing stones. No gallbladder wall thickening. No pericholecystic fluid. Positive sonographic Murphy sign noted by sonographer. Common bile duct: Diameter: 5-6 mm, normal. Liver: No focal lesion identified. Within normal limits in parenchymal echogenicity. Portal vein is patent on color Doppler imaging with normal direction of blood flow towards the liver. IVC: No abnormality visualized. Pancreas: Visualized portion unremarkable. Spleen: Size and appearance within normal limits. Right Kidney: Length: 10.4 cm. Normal parenchymal echogenicity. No hydronephrosis. No visualized stone or focal lesion. Left Kidney: Length: 10.5 cm. Normal parenchymal echogenicity. No hydronephrosis. No visualized stone or focal lesion. Abdominal aorta: No aneurysm visualized. Other findings: No free fluid or ascites. Please note that the right rectus sheath was not imaged by ultrasound. IMPRESSION: 1. Gallbladder sludge. Technologist notes a positive sonographic Murphy sign. This is of unknown significance, as there are no other findings of acute cholecystitis. 2. No biliary dilatation. 3.  Otherwise unremarkable abdominal ultrasound. The right rectus sheath and abdominal wall were not imaged for this exam. Electronically Signed   By: Narda Rutherford M.D.   On: 10/06/2020 23:35    Anti-infectives: Anti-infectives (From admission, onward)    Start     Dose/Rate Route Frequency Ordered Stop   10/07/20 0945  ceFAZolin (ANCEF) IVPB 2g/100 mL premix        2 g 200 mL/hr over 30 Minutes Intravenous On call to O.R. 10/07/20 0092 10/08/20 0559        Assessment/Plan Lower abdominal wall disruption with herniation of colon and omentum - Will plan for exploratory laparotomy today vs tomorrow with Dr. Luisa Hart depending on OR availability. He has explained the indications, risks and typical post-operative course with the patient. We explained she may need to pump and dump after surgery. Please keep NPO  FEN - NPO VTE - SCDs ID - Ancef on call to OR  Recent C-Section w/ 32d old baby boy - not currently breast feeding Hyperemesis gravidarum Anemia due to recent pregnancy Hypertension seizure disorder PTSD Low albumin   LOS: 0 days    Jacinto Halim , [facility] 10/07/2020, 8:52 AM Please see Amion for pager number during day hours 7:00am-4:30pm

## 2020-10-07 NOTE — Consult Note (Signed)
CC: abdominal pain, n/v  Requesting provider: Dr Jolayne Pantheronstant  HPI: Leah Shea is an 35 y.o. female who is here for worsening abdominal pain after recent c/s on 8/15.  Patient's pregnancy was complicated by hypertension and hyperemesis gravidarum.  She also has a seizure history and takes antiepileptics.  She occasion will have a breakthrough seizure about once every 2 weeks which she describes as a focal seizure.  She denies tobacco use.  She has had a a total of 4 C-sections now.  Her baby is in the NICU and doing better per patient.  Even after recent delivery she is still having difficulty with hyperemesis and is still having almost daily nausea vomiting.  She is having bowel movements but they are loose.  She had worsening abdominal pain while in the NICU and was brought to the MAU for evaluation.  She also has chronic dizziness.  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   [facility]red from [facility] problem list   PTSD (post-traumatic stress disorder)    Shortness of breath    with exertion or panic attack   Sickle cell trait (HCC)    Sleep apnea    [facility]    Past Surgical History:  Procedure Laterality Date   BREAST SURGERY Bilateral    reductions   CESAREAN SECTION  2010, 2012,2014   x 3   CESAREAN SECTION N/A 09/28/2020   Procedure: CESAREAN SECTION;  Surgeon: Venora MaplesEckstat, Matthew M, MD;  Location: [facility];  Service: Obstetrics;  Laterality: N/A;   EYE SURGERY     x 10 as a child   PILONIDAL CYST EXCISION  2020   TONSILLECTOMY N/A 10/07/2013   Procedure: TONSILLECTOMY;  Surgeon: Christia Readingwight Bates, MD;  [facility];  Service: ENT;  Laterality:  N/A;   TUMOR REMOVAL     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 Her smoking use included cigarettes. She has a 0.25 pack-year smoking history. She has never used smokeless tobacco. She reports that she does not currently use alcohol. She reports that she does not currently use drugs after having used the following drugs: Marijuana.  Allergies:  Allergies  Allergen Reactions   Other Anaphylaxis and Hives    Mushrooms   Nsaids Hives, Swelling and Other (See Comments)    Body burns    Reglan [Metoclopramide] Other (See Comments)    anxiety   Sulfa Antibiotics Other (See Comments)    burns    Medications: I have reviewed the patient's current medications.   ROS - all of the  below systems have been reviewed with the patient and positives are indicated with bold text General: chills, fever or night sweats Eyes: blurry vision or double vision ENT: epistaxis or sore throat Allergy/Immunology: itchy/watery eyes or nasal congestion Hematologic/Lymphatic: bleeding problems, blood clots or swollen lymph nodes Endocrine: temperature intolerance or unexpected weight changes Breast: new or changing breast lumps or nipple discharge Resp: cough, shortness of breath, or wheezing CV: chest pain or dyspnea on exertion GI: as per HPI GU: dysuria, trouble voiding, or hematuria MSK: joint pain or joint stiffness Neuro: TIA or stroke symptoms Derm: pruritus and skin lesion changes Psych: anxiety and depression  PE Blood pressure (!) 151/83, pulse 87, temperature 98.6 F (37 C), temperature source Oral, resp. rate 20, height 5'  6.5" (1.689 m), weight 91.4 kg, last menstrual period 01/29/2020, unknown if currently breastfeeding. Constitutional: NAD; conversant; no deformities Eyes: Moist conjunctiva; no lid lag; anicteric; PERRL Neck: Trachea midline; no thyromegaly Lungs: Normal respiratory effort; no tactile fremitus CV: RRR; no palpable thrills; no pitting edema GI: Abd soft, bulge in lower midline which is tender to palpation but no overlying cellulitis, honeycomb dressing of her Pfannenstiel incision; no palpable hepatosplenomegaly MSK: Normal gait; no clubbing/cyanosis Psychiatric: Appropriate affect; alert and oriented x3 Lymphatic: No palpable cervical or axillary lymphadenopathy Skin: No rash, jaundice or edema  Results for orders placed or performed during the hospital encounter of 10/06/20 (from the past 48 hour(s))  CBC     Status: Abnormal   Collection Time: 10/06/20 10:10 PM  Result Value Ref Range   WBC 8.5 4.0 - 10.5 K/uL   RBC 3.55 (L) 3.87 - 5.11 MIL/uL   Hemoglobin 10.8 (L) 12.0 - 15.0 g/dL   HCT 50.3 (L) 54.6 - 56.8 %   MCV 89.9 80.0 - 100.0 fL   MCH 30.4 26.0 - 34.0 pg   MCHC 33.9 30.0 - 36.0 g/dL   RDW 12.7 51.7 - 00.1 %   Platelets 247 150 - 400 K/uL   nRBC 0.0 0.0 - 0.2 %    Comment: Performed at [facility], [address] [place], [place] [address]  Comprehensive metabolic panel     Status: Abnormal   Collection Time: 10/06/20 10:10 PM  Result Value Ref Range   Sodium 138 135 - 145 mmol/L   Potassium 3.7 3.5 - 5.1 mmol/L   Chloride 105 98 - 111 mmol/L   CO2 24 22 - 32 mmol/L   Glucose, Bld 96 70 - 99 mg/dL    Comment: Glucose reference range applies only to samples taken after fasting for at least 8 hours.   BUN 12 6 - 20 mg/dL   Creatinine, Ser 9.67 0.44 - 1.00 mg/dL   Calcium 9.6 8.9 - 59.1 mg/dL   Total Protein 6.2 (L) 6.5 - 8.1 g/dL   Albumin 2.9 (L) 3.5 - 5.0 g/dL   AST 14 (L) 15 - 41 U/L   ALT 16 0 - 44 U/L   Alkaline Phosphatase 81 38 - 126 U/L   Total  Bilirubin 0.5 0.3 - 1.2 mg/dL   GFR, Estimated >63 >84 mL/min    Comment: (NOTE) Calculated using the CKD-EPI Creatinine Equation (2021)    Anion gap 9 5 - 15    Comment: Performed at [facility], [address], [place], [place] [address]    CT ABDOMEN PELVIS WO CONTRAST  Result Date: 10/07/2020 CLINICAL DATA:  Abdominal pain.  Concern for hernia. EXAM: CT ABDOMEN AND PELVIS WITHOUT CONTRAST  TECHNIQUE: Multidetector CT imaging of the abdomen and pelvis was performed following the standard protocol without IV contrast. COMPARISON:  CT abdomen pelvis dated 12/24/2018. FINDINGS: Evaluation of this exam is limited in the absence of intravenous contrast. Lower chest: The visualized lung bases are clear. No intra-abdominal free air or free fluid. Hepatobiliary: No focal liver abnormality is seen. No gallstones, gallbladder wall thickening, or biliary dilatation. Pancreas: Unremarkable. No pancreatic ductal dilatation or surrounding inflammatory changes. Spleen: Normal in size without focal abnormality. Adrenals/Urinary Tract: The adrenal glands are unremarkable. Kidneys, visualized ureters, and the urinary bladder appear unremarkable. Stomach/Bowel: There is moderate stool throughout the colon. Several scattered colonic diverticula without active inflammatory changes. There is no bowel obstruction or active inflammation. The appendix is normal. Vascular/Lymphatic: Abdominal aorta and IVC are unremarkable. No portal venous gas. There is no adenopathy. Reproductive: The uterus is anteverted and appears somewhat enlarged. Faint uterine fibroids noted. No adnexal masses. Other: There is a 5 cm defect in the anterior pelvic wall with herniation of a short segment of the transverse colon and small amount of omental fat. Mild haziness of the herniated fat may be reactive. Clinical correlation is recommended to evaluate for possibility of incarceration. No fluid collection. There is a 15 mm prominent nodular  density along the right hernia wall (60/3) of indeterminate etiology. There is diffuse stranding of the anterior lower abdominal and pelvic subcutaneous soft tissues as well as stranding and thickening of the periumbilical subcutaneous fat. No fluid collection. Musculoskeletal: No acute osseous pathology. IMPRESSION: 1. Anterior pelvic wall defect with herniation of a short segment of the transverse colon and small amount of omental fat. Mild haziness of the herniated fat may be reactive. Clinical correlation is recommended to evaluate for possibility of incarceration. No fluid collection. 2. Diffuse stranding of the anterior lower abdominal and pelvic subcutaneous soft tissues as well as stranding and thickening of the periumbilical subcutaneous fat. No fluid collection. 3. Colonic diverticulosis. No bowel obstruction. Normal appendix. Electronically Signed   By: Elgie Collard M.D.   On: 10/07/2020 00:31   US Abdomen Complete  Result Date: 10/06/2020 CLINICAL DATA:  Postoperative pain. Enlarged uterus. Mid abdominal and right upper quadrant pain. Status post Caesarean section 1 week ago. Question rectus sheath hematoma. EXAM: ABDOMEN ULTRASOUND COMPLETE COMPARISON:  None. FINDINGS: Gallbladder: Physiologically distended containing intraluminal sludge. No shadowing stones. No gallbladder wall thickening. No pericholecystic fluid. Positive sonographic Murphy sign noted by sonographer. Common bile duct: Diameter: 5-6 mm, normal. Liver: No focal lesion identified. Within normal limits in parenchymal echogenicity. Portal vein is patent on color Doppler imaging with normal direction of blood flow towards the liver. IVC: No abnormality visualized. Pancreas: Visualized portion unremarkable. Spleen: Size and appearance within normal limits. Right Kidney: Length: 10.4 cm. Normal parenchymal echogenicity. No hydronephrosis. No visualized stone or focal lesion. Left Kidney: Length: 10.5 cm. Normal parenchymal  echogenicity. No hydronephrosis. No visualized stone or focal lesion. Abdominal aorta: No aneurysm visualized. Other findings: No free fluid or ascites. Please note that the right rectus sheath was not imaged by ultrasound. IMPRESSION: 1. Gallbladder sludge. Technologist notes a positive sonographic Murphy sign. This is of unknown significance, as there are no other findings of acute cholecystitis. 2. No biliary dilatation. 3. Otherwise unremarkable abdominal ultrasound. The right rectus sheath and abdominal wall were not imaged for this exam. Electronically Signed   By: Narda Rutherford M.D.   On: 10/06/2020 23:35    Imaging: Personally reviewed along with a CT scan of her  abdomen from 2020  A/P: Aspasia Meshon Ney is an 35 y.o. 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  Her CT scan in 2020 showed an intact lower abdominal wall without diastasis.  She now has disruption of her abdominal wall in her lower midline extending down toward her pelvis after recent C-section.  Defect is at least 5 cm wide by 7 to potentially 10 cm vertically.  There is no sign of bowel obstruction upstream.  No leukocytosis.  No bowel wall thickening  No indications for urgent laparotomy at the current time  N.p.o. except meds and sips and chips and sips of water Check prealbumin  This will ultimately need to be repaired.  However will discuss timing of procedure with daytime team.  She would be at high risk for recurrence of her hernia given recent pregnancy, size of defect, and potential nutritional status.  Moreover if patient has ongoing retching and vomiting after hernia repair that would put stress on the abdominal wall increasing risk of recurrence.  I do not think her ongoing nausea vomiting is due to her  abdominal wall disruption.   Mary Sella. Andrey Campanile, MD, FACS General, Bariatric, & Minimally Invasive Surgery [facility], [place]

## 2020-10-07 NOTE — Progress Notes (Signed)
POSTPARTUM PROGRESS NOTE  POD #9  Subjective:  Leah Shea is a 35 y.o. [id number] s/p repeat C-section at [redacted]w[redacted]d readmitted due to nausea/vomiting and abdominal pain- now with abdominal wall herniation.  She reports that she is still having abdominal pain, nausea and headache.  Rates her pain 8/10.  States she not passed gas and only one episode of watery stool.  Denies fever/chills/chest pain/SOB.  Denies blurry vision or RUQ pain. Objective: Blood pressure (!) 139/91, pulse 84, temperature 98.5 F (36.9 C), temperature source Oral, resp. rate 18, height 5' 6.5" (1.689 m), weight 91.4 kg, last menstrual period 01/29/2020, SpO2 98 %, unknown if currently breastfeeding.  Physical Exam:  General: alert, cooperative, mild distress Chest: CTAB Heart: regular rate and rhythm Abdomen: soft, +tenderness with firm umbilical protrusion noted Uterine Fundus: firm, below umbilicus Incision: honeycomb dressing in place- clean and dry DVT Evaluation: No calf swelling or tenderness Extremities: no edema, SCDs in place Skin: warm, dry  Results for orders placed or performed during the hospital encounter of 10/06/20 (from the past 24 hour(s))  CBC     Status: Abnormal   Collection Time: 10/06/20 10:10 PM  Result Value Ref Range   WBC 8.5 4.0 - 10.5 K/uL   RBC 3.55 (L) 3.87 - 5.11 MIL/uL   Hemoglobin 10.8 (L) 12.0 - 15.0 g/dL   HCT 28.3 (L) 66.2 - 94.7 %   MCV 89.9 80.0 - 100.0 fL   MCH 30.4 26.0 - 34.0 pg   MCHC 33.9 30.0 - 36.0 g/dL   RDW 65.4 65.0 - 35.4 %   Platelets 247 150 - 400 K/uL   nRBC 0.0 0.0 - 0.2 %  Comprehensive metabolic panel     Status: Abnormal   Collection Time: 10/06/20 10:10 PM  Result Value Ref Range   Sodium 138 135 - 145 mmol/L   Potassium 3.7 3.5 - 5.1 mmol/L   Chloride 105 98 - 111 mmol/L   CO2 24 22 - 32 mmol/L   Glucose, Bld 96 70 - 99 mg/dL   BUN 12 6 - 20 mg/dL   Creatinine, Ser 6.56 0.44 - 1.00 mg/dL   Calcium 9.6 8.9 - 81.2 mg/dL   Total Protein  6.2 (L) 6.5 - 8.1 g/dL   Albumin 2.9 (L) 3.5 - 5.0 g/dL   AST 14 (L) 15 - 41 U/L   ALT 16 0 - 44 U/L   Alkaline Phosphatase 81 38 - 126 U/L   Total Bilirubin 0.5 0.3 - 1.2 mg/dL   GFR, Estimated >75 >17 mL/min   Anion gap 9 5 - 15  Type and screen [facility]     Status: None   Collection Time: 10/06/20 10:10 PM  Result Value Ref Range   ABO/RH(D) A POS    Antibody Screen NEG    Sample Expiration      10/09/2020,2359 Performed at [facility], [address], [place], [place] [address]   Resp Panel by RT-PCR (Flu A&B, Covid) Nasopharyngeal Swab     Status: None   Collection Time: 10/07/20  5:43 AM   Specimen: Nasopharyngeal Swab; Nasopharyngeal(NP) swabs in vial transport medium  Result Value Ref Range   SARS Coronavirus 2 by RT PCR NEGATIVE NEGATIVE   Influenza A by PCR NEGATIVE NEGATIVE   Influenza B by PCR NEGATIVE NEGATIVE  Prealbumin     Status: Abnormal   Collection Time: 10/07/20  5:46 AM  Result Value Ref Range   Prealbumin 17.0 (L) 18 -  38 mg/dL    Assessment/Plan: Leah Shea is a 35 y.o. [id number] POD#9 admitted due to abdominal wall disruption with herniation of colon and omentum  -currently sips/chips only -seen by general surgery- refer to their note.  Hopeful that repair may be scheduled for today -IV Dilaudid prn -IV zofran prn -Hypertension- ProcardiaXL 30mg  daily -Seizure d/o- continue Keppra -PTSD/anxiety- continue home medications -GDMA2- on metformin 500mg  bid   , DO Faculty Attending, [facility] 10/07/2020, 9:42 AM

## 2020-10-07 NOTE — Progress Notes (Signed)
Subjective: CC: Patient with continued pain of her lower abdomen. She reports she is nauseated and threw up this morning around 8am.   Objective: Vital signs in last 24 hours: Temp:  [98.5 F (36.9 C)-98.6 F (37 C)] 98.5 F (36.9 C) (08/24 0753) Pulse Rate:  [78-98] 84 (08/24 0753) Resp:  [18-20] 18 (08/24 0753) BP: (131-151)/(78-109) 139/91 (08/24 0753) SpO2:  [98 %] 98 % (08/24 0753) Weight:  [91.4 kg] 91.4 kg (08/23 2113)    Intake/Output from previous day: No intake/output data recorded. Intake/Output this shift: No intake/output data recorded.  PE: Gen:  Alert, NAD, pleasant Pulm:  Normal rate and effort  Abd: Abd soft, bulge in lower midline which is tender to palpation but no overlying cellulitis, honeycomb dressing of her Pfannenstiel incision; no palpable hepatosplenomegaly Psych: A&Ox3  Skin: no rashes noted, warm and dry  Lab Results:  Recent Labs    10/06/20 2210  WBC 8.5  HGB 10.8*  HCT 31.9*  PLT 247   BMET Recent Labs    10/06/20 2210  NA 138  K 3.7  CL 105  CO2 24  GLUCOSE 96  BUN 12  CREATININE 0.83  CALCIUM 9.6   PT/INR No results for input(s): LABPROT, INR in the last 72 hours. CMP     Component Value Date/Time   NA 138 10/06/2020 2210   K 3.7 10/06/2020 2210   CL 105 10/06/2020 2210   CO2 24 10/06/2020 2210   GLUCOSE 96 10/06/2020 2210   BUN 12 10/06/2020 2210   CREATININE 0.83 10/06/2020 2210   CALCIUM 9.6 10/06/2020 2210   PROT 6.2 (L) 10/06/2020 2210   ALBUMIN 2.9 (L) 10/06/2020 2210   AST 14 (L) 10/06/2020 2210   ALT 16 10/06/2020 2210   ALKPHOS 81 10/06/2020 2210   BILITOT 0.5 10/06/2020 2210   GFRNONAA >60 10/06/2020 2210   GFRAA >60 02/08/2019 1418   Lipase     Component Value Date/Time   LIPASE 23 02/08/2019 1418    Studies/Results: CT ABDOMEN PELVIS WO CONTRAST  Result Date: 10/07/2020 CLINICAL DATA:  Abdominal pain.  Concern for hernia. EXAM: CT ABDOMEN AND PELVIS WITHOUT CONTRAST TECHNIQUE:  Multidetector CT imaging of the abdomen and pelvis was performed following the standard protocol without IV contrast. COMPARISON:  CT abdomen pelvis dated 12/24/2018. FINDINGS: Evaluation of this exam is limited in the absence of intravenous contrast. Lower chest: The visualized lung bases are clear. No intra-abdominal free air or free fluid. Hepatobiliary: No focal liver abnormality is seen. No gallstones, gallbladder wall thickening, or biliary dilatation. Pancreas: Unremarkable. No pancreatic ductal dilatation or surrounding inflammatory changes. Spleen: Normal in size without focal abnormality. Adrenals/Urinary Tract: The adrenal glands are unremarkable. Kidneys, visualized ureters, and the urinary bladder appear unremarkable. Stomach/Bowel: There is moderate stool throughout the colon. Several scattered colonic diverticula without active inflammatory changes. There is no bowel obstruction or active inflammation. The appendix is normal. Vascular/Lymphatic: Abdominal aorta and IVC are unremarkable. No portal venous gas. There is no adenopathy. Reproductive: The uterus is anteverted and appears somewhat enlarged. Faint uterine fibroids noted. No adnexal masses. Other: There is a 5 cm defect in the anterior pelvic wall with herniation of a short segment of the transverse colon and small amount of omental fat. Mild haziness of the herniated fat may be reactive. Clinical correlation is recommended to evaluate for possibility of incarceration. No fluid collection. There is a 15 mm prominent nodular density along the right hernia wall (60/3)  of indeterminate etiology. There is diffuse stranding of the anterior lower abdominal and pelvic subcutaneous soft tissues as well as stranding and thickening of the periumbilical subcutaneous fat. No fluid collection. Musculoskeletal: No acute osseous pathology. IMPRESSION: 1. Anterior pelvic wall defect with herniation of a short segment of the transverse colon and small amount  of omental fat. Mild haziness of the herniated fat may be reactive. Clinical correlation is recommended to evaluate for possibility of incarceration. No fluid collection. 2. Diffuse stranding of the anterior lower abdominal and pelvic subcutaneous soft tissues as well as stranding and thickening of the periumbilical subcutaneous fat. No fluid collection. 3. Colonic diverticulosis. No bowel obstruction. Normal appendix. Electronically Signed   By: Arash  Radparvar M.D.   On: 10/07/2020 00:31   US Abdomen Complete  Result Date: 10/06/2020 CLINICAL DATA:  Postoperative pain. Enlarged uterus. Mid abdominal and right upper quadrant pain. Status post Caesarean section 1 week ago. Question rectus sheath hematoma. EXAM: ABDOMEN ULTRASOUND COMPLETE COMPARISON:  None. FINDINGS: Gallbladder: Physiologically distended containing intraluminal sludge. No shadowing stones. No gallbladder wall thickening. No pericholecystic fluid. Positive sonographic Murphy sign noted by sonographer. Common bile duct: Diameter: 5-6 mm, normal. Liver: No focal lesion identified. Within normal limits in parenchymal echogenicity. Portal vein is patent on color Doppler imaging with normal direction of blood flow towards the liver. IVC: No abnormality visualized. Pancreas: Visualized portion unremarkable. Spleen: Size and appearance within normal limits. Right Kidney: Length: 10.4 cm. Normal parenchymal echogenicity. No hydronephrosis. No visualized stone or focal lesion. Left Kidney: Length: 10.5 cm. Normal parenchymal echogenicity. No hydronephrosis. No visualized stone or focal lesion. Abdominal aorta: No aneurysm visualized. Other findings: No free fluid or ascites. Please note that the right rectus sheath was not imaged by ultrasound. IMPRESSION: 1. Gallbladder sludge. Technologist notes a positive sonographic Murphy sign. This is of unknown significance, as there are no other findings of acute cholecystitis. 2. No biliary dilatation. 3.  Otherwise unremarkable abdominal ultrasound. The right rectus sheath and abdominal wall were not imaged for this exam. Electronically Signed   By: Melanie  Sanford M.D.   On: 10/06/2020 23:35    Anti-infectives: Anti-infectives (From admission, onward)    Start     Dose/Rate Route Frequency Ordered Stop   10/07/20 0945  ceFAZolin (ANCEF) IVPB 2g/100 mL premix        2 g 200 mL/hr over 30 Minutes Intravenous On call to O.R. 10/07/20 0852 10/08/20 0559        Assessment/Plan Lower abdominal wall disruption with herniation of colon and omentum - Will plan for exploratory laparotomy today vs tomorrow with Dr. Cornett depending on OR availability. He has explained the indications, risks and typical post-operative course with the patient. We explained she may need to pump and dump after surgery. Please keep NPO  FEN - NPO VTE - SCDs ID - Ancef on call to OR  Recent C-Section w/ 9d old baby boy - not currently breast feeding Hyperemesis gravidarum Anemia due to recent pregnancy Hypertension seizure disorder PTSD Low albumin   LOS: 0 days    Leah Shea M Leah Shea , PA-C [facility] 10/07/2020, 8:52 AM Please see Amion for pager number during day hours 7:00am-4:30pm  

## 2020-10-07 NOTE — Anesthesia Preprocedure Evaluation (Addendum)
Anesthesia Evaluation  Patient identified by MRN, date of birth, ID band Patient awake    Reviewed: Allergy & Precautions, H&P , NPO status , Patient's Chart, lab work & pertinent test results  History of Anesthesia Complications Negative for: history of anesthetic complications  Airway Mallampati: II  TM Distance: >3 FB Neck ROM: Full    Dental no notable dental hx. (+) Dental Advisory Given   Pulmonary sleep apnea , former smoker,    Pulmonary exam normal        Cardiovascular hypertension, Pt. on medications Normal cardiovascular exam     Neuro/Psych  Headaches, Seizures -, Well Controlled,  PSYCHIATRIC DISORDERS Anxiety Depression    GI/Hepatic GERD  Medicated,(+)     substance abuse  alcohol use and marijuana use,   Endo/Other  diabetes  Renal/GU negative Renal ROS  negative genitourinary   Musculoskeletal   Abdominal   Peds  Hematology  (+) Blood dyscrasia, Sickle cell trait and anemia ,   Anesthesia Other Findings   Reproductive/Obstetrics                            Anesthesia Physical  Anesthesia Plan  ASA: 3  Anesthesia Plan: General   Post-op Pain Management:    Induction: Intravenous  PONV Risk Score and Plan: 4 or greater and Scopolamine patch - Pre-op, Ondansetron, Dexamethasone and Midazolam  Airway Management Planned: Oral ETT  Additional Equipment: None  Intra-op Plan:   Post-operative Plan: Extubation in OR  Informed Consent: I have reviewed the patients History and Physical, chart, labs and discussed the procedure including the risks, benefits and alternatives for the proposed anesthesia with the patient or authorized representative who has indicated his/her understanding and acceptance.     Dental advisory given  Plan Discussed with: Anesthesiologist and CRNA  Anesthesia Plan Comments:        Anesthesia Quick Evaluation

## 2020-10-07 NOTE — H&P (Signed)
Chief Complaint:  Headache and Hypertension   Event Date/Time   First Provider Initiated Contact with Patient 10/06/20 2138      HPI: Leah Shea is a 35 y.o. [id number] who presents to maternity admissions reporting abdominal pain in RUQ/lower rib area and just below umbilicus. States has been there since surgery and is unrelieved by analgesics.  Also has headache, unrelieved by meds.  Took Tylenol and 1 Oxy at 4pm.  . She reports vaginal bleeding, but no vaginal itching/burning, urinary symptoms, n/v, or fever/chills.    Headache  This is a recurrent problem. The problem occurs constantly. The problem has been unchanged. The pain quality is similar to prior headaches. The quality of the pain is described as aching. The pain is at a severity of 7/10. Associated symptoms include abdominal pain, dizziness and photophobia. Pertinent negatives include no blurred vision, fever, seizures (last one Friday), sore throat or vomiting. She has tried acetaminophen and oral narcotics for the symptoms. The treatment provided no relief. Her past medical history is significant for hypertension.  Hypertension This is a recurrent problem. Associated symptoms include headaches. Pertinent negatives include no blurred vision. There are no associated agents to hypertension. Treatments tried: Labetealol.   RN Note: PT SAYS SHE DEL BY C/S ON 8-15 WENT HOME ON Friday - 8-19 HIGH BP DURING PNC HAD C/S BC BP WAS HIGH.  SENT HOME WITH MEDS- HAS BEEN TAKING MEDS TONIGHT - WAS IN NICU  WITH BABY-  WHILE THERE STILL HAS H/A- TOOK TYLENOL AT 430PM- XS 2 TABS - ALSO TOOK OXYCODONE 1 TAB    VISION- SPOTS, HAS GUSHES OF BRIGHT RED BLEEDING - NOT CONSTANT LACTATION CONSULTANT TOLD TO COME TO MAU   DIZZY WHEN SHE STANDS,  HAS SHARP PAIN IN RIGHT RIBS AND RIGHT SIDE OF UMBILICUS  PT IS PUMPING AND SUPPLY HAS REALLY DECREASED HAS FOCAL SEIZURES- LAST ONE WAS Friday - S/S- HAS SMELLS, VISION,H/A- SHE THEN ZONES  OUT.  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   Transferred from DOD/VA problem list   PTSD (post-traumatic stress disorder)    Shortness of breath    with exertion or panic attack   Sickle cell trait (HCC)    Sleep apnea    awaiting a sleep study to be done at [place]    Past obstetric history: OB History  Gravida Para Term Preterm AB Living  7 5 2 3 2 5   SAB IAB Ectopic Multiple Live Births  2     0 5    # Outcome Date GA Lbr Len/2nd Weight Sex Delivery Anes PTL Lv  7 Preterm 09/28/20 [redacted]w[redacted]d  2080 g M CS-LTranv Spinal  LIV  6 Preterm 11/01/12 [redacted]w[redacted]d   M CS-LTranv   LIV  5 Preterm 06/04/10 [redacted]w[redacted]d   M CS-LTranv   LIV  4 Term 01/21/09    M CS-LTranv   LIV  3 Term 12/26/04    F Vag-Spont   LIV  2 SAB           1 SAB  Past Surgical History: Past Surgical History:  Procedure Laterality Date   BREAST SURGERY Bilateral    reductions   CESAREAN SECTION  2010, 2012,2014   x 3   CESAREAN SECTION N/A 09/28/2020   Procedure: CESAREAN SECTION;  Surgeon: Venora Maples, MD;  Location: [facility]S;  Service: Obstetrics;  Laterality: N/A;   EYE SURGERY     x 10 as a child   PILONIDAL CYST EXCISION  2020   TONSILLECTOMY N/A 10/07/2013   Procedure: TONSILLECTOMY;  Surgeon: Christia Reading, MD;  [facility];  Service: ENT;  Laterality: N/A;   TUMOR REMOVAL     tumor removed from back    Family History: Family History  Problem Relation Age of Onset   Breast cancer Mother    Breast cancer Maternal Aunt 36       metastatic   Cancer Maternal Uncle        unk type   Breast cancer Maternal Grandmother    Prostate cancer Maternal Grandfather         metastatic   Cancer Maternal Aunt        either breast or ovarian     Social History: Social History   Tobacco Use   Smoking status: Former    Packs/day: 0.25    Years: 1.00    Pack years: 0.25    Types: Cigarettes    Quit date: 07/02/2013    Years since quitting: 7.2   Smokeless tobacco: Never  Vaping Use   Vaping Use: Never used  Substance Use Topics   Alcohol use: Not Currently   Drug use: Not Currently    Types: Marijuana    Comment: last used January 3    Allergies:  Allergies  Allergen Reactions   Other Anaphylaxis and Hives    Mushrooms   Nsaids Hives, Swelling and Other (See Comments)    Body burns    Reglan [Metoclopramide] Other (See Comments)    anxiety   Sulfa Antibiotics Other (See Comments)    burns    Meds:  Medications Prior to Admission  Medication Sig Dispense Refill Last Dose   acetaminophen (TYLENOL) 500 MG tablet Take 2 tablets (1,000 mg total) by mouth every 6 (six) hours. 30 tablet 0 10/06/2020   busPIRone (BUSPAR) 15 MG tablet Take by mouth.   10/06/2020   cyclobenzaprine (FLEXERIL) 10 MG tablet Take 1 tablet (10 mg total) by mouth 3 (three) times daily as needed (back pain/muscle pain). 30 tablet 0 Past Week   escitalopram (LEXAPRO) 10 MG tablet Take 1 tablet (10 mg total) by mouth daily. 30 tablet 3 10/05/2020   hydrOXYzine (VISTARIL) 25 MG capsule Take 2 capsules (50 mg total) by mouth 3 (three) times daily as needed for anxiety. 60 capsule 2 10/05/2020   labetalol (NORMODYNE) 200 MG tablet Take 1 tablet (200 mg total) by mouth 3 (three) times daily. 90 tablet 0 10/06/2020 at 12 NOON   levETIRAcetam (KEPPRA) 500 MG tablet Take 1 tablet (500 mg total) by mouth 2 (two) times daily. 60 tablet 2 10/06/2020   LORazepam (ATIVAN) 0.5 MG tablet Take 1 tablet (0.5 mg total) by mouth every 6 (six) hours as needed for anxiety or sleep. 30 tablet 0 10/05/2020   metFORMIN (GLUCOPHAGE) 500 MG tablet Take 1 tablet (500 mg total) by mouth 2 (two) times daily with  a meal. 60 tablet 0 10/06/2020   Multiple Vitamins-Minerals (CERTAVITE/ANTIOXIDANTS) TABS Take 1 tablet by mouth daily at 12 noon. 30 tablet 2  10/06/2020   oxyCODONE (OXY IR/ROXICODONE) 5 MG immediate release tablet Take 1-2 tablets (5-10 mg total) by mouth every 4 (four) hours as needed for moderate pain. 30 tablet 0 10/06/2020   pantoprazole (PROTONIX) 40 MG tablet Take 1 tablet (40 mg total) by mouth daily. 30 tablet 6 10/06/2020    I have reviewed patient's Past Medical Hx, Surgical Hx, Family Hx, Social Hx, medications and allergies.  ROS:  Review of Systems  Constitutional:  Negative for fever.  HENT:  Negative for sore throat.   Eyes:  Positive for photophobia. Negative for blurred vision.  Gastrointestinal:  Positive for abdominal pain. Negative for vomiting.  Neurological:  Positive for dizziness and headaches. Negative for seizures (last one Friday).  Other systems negative     Physical Exam  Patient Vitals for the past 24 hrs:  BP Temp Temp src Pulse Resp Height Weight  10/06/20 2113 (!) 131/97 98.6 F (37 C) Oral 93 20 5' 6.5" (1.689 m) 91.4 kg   Constitutional: Well-developed, well-nourished female in no acute distress.  Tearful. Cardiovascular: normal rate and rhythm Respiratory: normal effort, no distress.  GI: Abd soft, tender throughout.  Nondistended, but there is a large firm mass at umbilicus,   Tender.  Dressing intact..  No rebound, Mild guarding.  MS: Extremities nontender, no edema, normal ROM Neurologic: Alert and oriented x 4.   Grossly nonfocal. GU: Neg CVAT. Skin:  Warm and Dry Psych:  Affect appropriate.  PELVIC EXAM: deferred   Labs: Results for orders placed or performed during the hospital encounter of 10/06/20 (from the past 24 hour(s))  CBC     Status: Abnormal   Collection Time: 10/06/20 10:10 PM  Result Value Ref Range   WBC 8.5 4.0 - 10.5 K/uL   RBC 3.55 (L) 3.87 - 5.11 MIL/uL   Hemoglobin 10.8 (L) 12.0 - 15.0 g/dL   HCT 16.131.9 (L) 09.636.0 -  46.0 %   MCV 89.9 80.0 - 100.0 fL   MCH 30.4 26.0 - 34.0 pg   MCHC 33.9 30.0 - 36.0 g/dL   RDW 04.512.5 40.911.5 - 81.115.5 %   Platelets 247 150 - 400 K/uL   nRBC 0.0 0.0 - 0.2 %  Comprehensive metabolic panel     Status: Abnormal   Collection Time: 10/06/20 10:10 PM  Result Value Ref Range   Sodium 138 135 - 145 mmol/L   Potassium 3.7 3.5 - 5.1 mmol/L   Chloride 105 98 - 111 mmol/L   CO2 24 22 - 32 mmol/L   Glucose, Bld 96 70 - 99 mg/dL   BUN 12 6 - 20 mg/dL   Creatinine, Ser 9.140.83 0.44 - 1.00 mg/dL   Calcium 9.6 8.9 - 78.210.3 mg/dL   Total Protein 6.2 (L) 6.5 - 8.1 g/dL   Albumin 2.9 (L) 3.5 - 5.0 g/dL   AST 14 (L) 15 - 41 U/L   ALT 16 0 - 44 U/L   Alkaline Phosphatase 81 38 - 126 U/L   Total Bilirubin 0.5 0.3 - 1.2 mg/dL   GFR, Estimated >95>60 >62>60 mL/min   Anion gap 9 5 - 15    --/--/A POS (08/15 1224)  Imaging:  CT ABDOMEN PELVIS WO CONTRAST  Result Date: 10/07/2020 CLINICAL DATA:  Abdominal pain.  Concern for hernia. EXAM: CT ABDOMEN AND PELVIS WITHOUT CONTRAST TECHNIQUE: Multidetector CT imaging of the abdomen and pelvis was performed following the standard protocol without IV contrast. COMPARISON:  CT abdomen pelvis dated 12/24/2018. FINDINGS: Evaluation of this exam  is limited in the absence of intravenous contrast. Lower chest: The visualized lung bases are clear. No intra-abdominal free air or free fluid. Hepatobiliary: No focal liver abnormality is seen. No gallstones, gallbladder wall thickening, or biliary dilatation. Pancreas: Unremarkable. No pancreatic ductal dilatation or surrounding inflammatory changes. Spleen: Normal in size without focal abnormality. Adrenals/Urinary Tract: The adrenal glands are unremarkable. Kidneys, visualized ureters, and the urinary bladder appear unremarkable. Stomach/Bowel: There is moderate stool throughout the colon. Several scattered colonic diverticula without active inflammatory changes. There is no bowel obstruction or active inflammation. The  appendix is normal. Vascular/Lymphatic: Abdominal aorta and IVC are unremarkable. No portal venous gas. There is no adenopathy. Reproductive: The uterus is anteverted and appears somewhat enlarged. Faint uterine fibroids noted. No adnexal masses. Other: There is a 5 cm defect in the anterior pelvic wall with herniation of a short segment of the transverse colon and small amount of omental fat. Mild haziness of the herniated fat may be reactive. Clinical correlation is recommended to evaluate for possibility of incarceration. No fluid collection. There is a 15 mm prominent nodular density along the right hernia wall (60/3) of indeterminate etiology. There is diffuse stranding of the anterior lower abdominal and pelvic subcutaneous soft tissues as well as stranding and thickening of the periumbilical subcutaneous fat. No fluid collection. Musculoskeletal: No acute osseous pathology. IMPRESSION: 1. Anterior pelvic wall defect with herniation of a short segment of the transverse colon and small amount of omental fat. Mild haziness of the herniated fat may be reactive. Clinical correlation is recommended to evaluate for possibility of incarceration. No fluid collection. 2. Diffuse stranding of the anterior lower abdominal and pelvic subcutaneous soft tissues as well as stranding and thickening of the periumbilical subcutaneous fat. No fluid collection. 3. Colonic diverticulosis. No bowel obstruction. Normal appendix. Electronically Signed   By: Elgie Collard M.D.   On: 10/07/2020 00:31   US Abdomen Complete  Result Date: 10/06/2020 CLINICAL DATA:  Postoperative pain. Enlarged uterus. Mid abdominal and right upper quadrant pain. Status post Caesarean section 1 week ago. Question rectus sheath hematoma. EXAM: ABDOMEN ULTRASOUND COMPLETE COMPARISON:  None. FINDINGS: Gallbladder: Physiologically distended containing intraluminal sludge. No shadowing stones. No gallbladder wall thickening. No pericholecystic fluid.  Positive sonographic Murphy sign noted by sonographer. Common bile duct: Diameter: 5-6 mm, normal. Liver: No focal lesion identified. Within normal limits in parenchymal echogenicity. Portal vein is patent on color Doppler imaging with normal direction of blood flow towards the liver. IVC: No abnormality visualized. Pancreas: Visualized portion unremarkable. Spleen: Size and appearance within normal limits. Right Kidney: Length: 10.4 cm. Normal parenchymal echogenicity. No hydronephrosis. No visualized stone or focal lesion. Left Kidney: Length: 10.5 cm. Normal parenchymal echogenicity. No hydronephrosis. No visualized stone or focal lesion. Abdominal aorta: No aneurysm visualized. Other findings: No free fluid or ascites. Please note that the right rectus sheath was not imaged by ultrasound. IMPRESSION: 1. Gallbladder sludge. Technologist notes a positive sonographic Murphy sign. This is of unknown significance, as there are no other findings of acute cholecystitis. 2. No biliary dilatation. 3. Otherwise unremarkable abdominal ultrasound. The right rectus sheath and abdominal wall were not imaged for this exam. Electronically Signed   By: Narda Rutherford M.D.   On: 10/06/2020 23:35     MAU Course/MDM: I have ordered labs as follows:  CBC and CMET Imaging ordered: Abdominal US which showed uterus to be involuted, so CT was ordered to further assess abdominal wall.  This showed a pelvic wall hernia with  possible incarcerated transverse colon and omentum.  Results reviewed.   Consult Dr Jolayne Panther who ordered a surgical consult.  Called Dr Gaynelle Adu who agreed to come and evaluate the patient. Marland Kitchen   Has been NPO since afternoon.  Treatments in MAU included Dilaudid x 1mg  and Zofran x 4mg ..   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 Will need to increase Labetalol to 300mg  bid after discharge MD to follow  [facility] CNM, MSN Certified Nurse-Midwife 10/06/2020 9:38 PM

## 2020-10-08 ENCOUNTER — Inpatient Hospital Stay (HOSPITAL_COMMUNITY): Payer: No Typology Code available for payment source | Admitting: Anesthesiology

## 2020-10-08 ENCOUNTER — Encounter (HOSPITAL_COMMUNITY): Payer: Self-pay | Admitting: Obstetrics and Gynecology

## 2020-10-08 ENCOUNTER — Encounter (HOSPITAL_COMMUNITY)
Admission: AD | Disposition: A | Discharge status: Home / Self Care | Payer: Self-pay | Source: Home / Self Care | Attending: Obstetrics and Gynecology

## 2020-10-08 DIAGNOSIS — O99355 Diseases of the nervous system complicating the puerperium: Secondary | ICD-10-CM | POA: Diagnosis not present

## 2020-10-08 DIAGNOSIS — O24435 Gestational diabetes mellitus in puerperium, controlled by oral hypoglycemic drugs: Secondary | ICD-10-CM | POA: Diagnosis not present

## 2020-10-08 DIAGNOSIS — Z20822 Contact with and (suspected) exposure to covid-19: Secondary | ICD-10-CM | POA: Diagnosis not present

## 2020-10-08 DIAGNOSIS — O900 Disruption of cesarean delivery wound: Secondary | ICD-10-CM | POA: Diagnosis not present

## 2020-10-08 DIAGNOSIS — K9189 Other postprocedural complications and disorders of digestive system: Secondary | ICD-10-CM

## 2020-10-08 HISTORY — PX: ABDOMINAL HERNIA REPAIR: SHX539

## 2020-10-08 HISTORY — PX: HERNIA REPAIR: SHX51

## 2020-10-08 HISTORY — PX: LAPAROTOMY: SHX154

## 2020-10-08 LAB — GLUCOSE, CAPILLARY: Glucose-Capillary: 97 mg/dL (ref 70–99)

## 2020-10-08 SURGERY — LAPAROTOMY, EXPLORATORY
Anesthesia: General | Site: Abdomen

## 2020-10-08 MED ORDER — PROMETHAZINE HCL 25 MG/ML IJ SOLN: 6.2500 mg | INTRAMUSCULAR | Status: DC | PRN

## 2020-10-08 MED ORDER — ONDANSETRON HCL 4 MG/2ML IJ SOLN
INTRAMUSCULAR | Status: DC | PRN
  Administered 2020-10-08 (×2): 4 mg via INTRAVENOUS

## 2020-10-08 MED ORDER — PROPOFOL 10 MG/ML IV BOLUS
INTRAVENOUS | Status: DC | PRN
  Administered 2020-10-08: 150 mg via INTRAVENOUS
  Administered 2020-10-08: 20 mg via INTRAVENOUS

## 2020-10-08 MED ORDER — ACETAMINOPHEN 10 MG/ML IV SOLN
INTRAVENOUS | Status: DC | PRN
  Administered 2020-10-08: 1000 mg via INTRAVENOUS

## 2020-10-08 MED ORDER — LACTATED RINGERS IV SOLN: INTRAVENOUS | Status: DC | PRN

## 2020-10-08 MED ORDER — SODIUM CHLORIDE 0.9 % IV SOLN
INTRAVENOUS | Status: DC | PRN
  Administered 2020-10-08: 40 mL

## 2020-10-08 MED ORDER — DEXAMETHASONE SODIUM PHOSPHATE 10 MG/ML IJ SOLN
INTRAMUSCULAR | Status: DC | PRN
  Administered 2020-10-08: 10 mg via INTRAVENOUS

## 2020-10-08 MED ORDER — DEXAMETHASONE SODIUM PHOSPHATE 10 MG/ML IJ SOLN
INTRAMUSCULAR | Status: AC
  Filled 2020-10-08: qty 1

## 2020-10-08 MED ORDER — CEFAZOLIN SODIUM-DEXTROSE 2-3 GM-%(50ML) IV SOLR
INTRAVENOUS | Status: DC | PRN
  Administered 2020-10-08: 2 g via INTRAVENOUS

## 2020-10-08 MED ORDER — AMISULPRIDE (ANTIEMETIC) 5 MG/2ML IV SOLN: 10.0000 mg | Freq: Once | INTRAVENOUS | Status: DC | PRN

## 2020-10-08 MED ORDER — LABETALOL HCL 5 MG/ML IV SOLN
INTRAVENOUS | Status: AC
  Filled 2020-10-08: qty 4

## 2020-10-08 MED ORDER — ORAL CARE MOUTH RINSE: 15.0000 mL | Freq: Once | OROMUCOSAL | Status: AC

## 2020-10-08 MED ORDER — ACETAMINOPHEN 10 MG/ML IV SOLN
INTRAVENOUS | Status: AC
  Filled 2020-10-08: qty 100

## 2020-10-08 MED ORDER — FENTANYL CITRATE (PF) 250 MCG/5ML IJ SOLN
INTRAMUSCULAR | Status: AC
  Filled 2020-10-08: qty 5

## 2020-10-08 MED ORDER — LABETALOL HCL 5 MG/ML IV SOLN
INTRAVENOUS | Status: DC | PRN
  Administered 2020-10-08 (×4): 5 mg via INTRAVENOUS

## 2020-10-08 MED ORDER — ESMOLOL HCL 100 MG/10ML IV SOLN
INTRAVENOUS | Status: DC | PRN
  Administered 2020-10-08 (×3): 20 mg via INTRAVENOUS

## 2020-10-08 MED ORDER — LIDOCAINE 2% (20 MG/ML) 5 ML SYRINGE
INTRAMUSCULAR | Status: AC
  Filled 2020-10-08: qty 5

## 2020-10-08 MED ORDER — FENTANYL CITRATE (PF) 100 MCG/2ML IJ SOLN
INTRAMUSCULAR | Status: AC
  Filled 2020-10-08: qty 2

## 2020-10-08 MED ORDER — HYDRALAZINE HCL 20 MG/ML IJ SOLN
INTRAMUSCULAR | Status: DC | PRN
  Administered 2020-10-08: 5 mg via INTRAVENOUS

## 2020-10-08 MED ORDER — HYDROMORPHONE HCL 1 MG/ML IJ SOLN
1.0000 mg | INTRAMUSCULAR | Status: DC | PRN
Start: 2020-10-08 — End: 2020-10-09
  Administered 2020-10-08 – 2020-10-09 (×5): 1 mg via INTRAVENOUS
  Filled 2020-10-08 (×4): qty 1

## 2020-10-08 MED ORDER — CHLORHEXIDINE GLUCONATE 0.12 % MT SOLN
OROMUCOSAL | Status: AC
  Administered 2020-10-08: 15 mL via OROMUCOSAL
  Filled 2020-10-08: qty 15

## 2020-10-08 MED ORDER — ROCURONIUM BROMIDE 100 MG/10ML IV SOLN
INTRAVENOUS | Status: DC | PRN
  Administered 2020-10-08: 70 mg via INTRAVENOUS
  Administered 2020-10-08: 10 mg via INTRAVENOUS

## 2020-10-08 MED ORDER — HYDRALAZINE HCL 20 MG/ML IJ SOLN
INTRAMUSCULAR | Status: AC
  Filled 2020-10-08: qty 1

## 2020-10-08 MED ORDER — FENTANYL CITRATE (PF) 100 MCG/2ML IJ SOLN
INTRAMUSCULAR | Status: DC | PRN
  Administered 2020-10-08 (×2): 50 ug via INTRAVENOUS
  Administered 2020-10-08: 100 ug via INTRAVENOUS
  Administered 2020-10-08 (×4): 50 ug via INTRAVENOUS
  Administered 2020-10-08: 100 ug via INTRAVENOUS

## 2020-10-08 MED ORDER — ROCURONIUM BROMIDE 10 MG/ML (PF) SYRINGE
PREFILLED_SYRINGE | INTRAVENOUS | Status: AC
  Filled 2020-10-08: qty 10

## 2020-10-08 MED ORDER — ONDANSETRON HCL 4 MG/2ML IJ SOLN
INTRAMUSCULAR | Status: AC
  Filled 2020-10-08: qty 2

## 2020-10-08 MED ORDER — SUCCINYLCHOLINE CHLORIDE 200 MG/10ML IV SOSY
PREFILLED_SYRINGE | INTRAVENOUS | Status: DC | PRN
  Administered 2020-10-08: 160 mg via INTRAVENOUS

## 2020-10-08 MED ORDER — LIDOCAINE 2% (20 MG/ML) 5 ML SYRINGE
INTRAMUSCULAR | Status: DC | PRN
  Administered 2020-10-08: 100 mg via INTRAVENOUS

## 2020-10-08 MED ORDER — CEFAZOLIN SODIUM-DEXTROSE 2-4 GM/100ML-% IV SOLN
INTRAVENOUS | Status: AC
  Filled 2020-10-08: qty 100

## 2020-10-08 MED ORDER — DEXMEDETOMIDINE HCL IN NACL 200 MCG/50ML IV SOLN
INTRAVENOUS | Status: AC
  Filled 2020-10-08: qty 50

## 2020-10-08 MED ORDER — BUPIVACAINE LIPOSOME 1.3 % IJ SUSP
INTRAMUSCULAR | Status: AC
  Filled 2020-10-08: qty 20

## 2020-10-08 MED ORDER — FENTANYL CITRATE (PF) 100 MCG/2ML IJ SOLN
25.0000 ug | INTRAMUSCULAR | Status: DC | PRN
  Administered 2020-10-08 (×2): 50 ug via INTRAVENOUS

## 2020-10-08 MED ORDER — ALBUMIN HUMAN 5 % IV SOLN: INTRAVENOUS | Status: DC | PRN

## 2020-10-08 MED ORDER — PROPOFOL 10 MG/ML IV BOLUS
INTRAVENOUS | Status: AC
  Filled 2020-10-08: qty 20

## 2020-10-08 MED ORDER — SUGAMMADEX SODIUM 200 MG/2ML IV SOLN
INTRAVENOUS | Status: DC | PRN
  Administered 2020-10-08: 250 mg via INTRAVENOUS

## 2020-10-08 MED ORDER — PHENYLEPHRINE 40 MCG/ML (10ML) SYRINGE FOR IV PUSH (FOR BLOOD PRESSURE SUPPORT)
PREFILLED_SYRINGE | INTRAVENOUS | Status: AC
  Filled 2020-10-08: qty 10

## 2020-10-08 MED ORDER — CHLORHEXIDINE GLUCONATE 0.12 % MT SOLN: 15.0000 mL | Freq: Once | OROMUCOSAL | Status: AC

## 2020-10-08 MED ORDER — DEXMEDETOMIDINE (PRECEDEX) IN NS 20 MCG/5ML (4 MCG/ML) IV SYRINGE
PREFILLED_SYRINGE | INTRAVENOUS | Status: DC | PRN
  Administered 2020-10-08 (×2): 8 ug via INTRAVENOUS

## 2020-10-08 MED ORDER — ARTIFICIAL TEARS OPHTHALMIC OINT
TOPICAL_OINTMENT | OPHTHALMIC | Status: AC
  Filled 2020-10-08: qty 3.5

## 2020-10-08 MED ORDER — MIDAZOLAM HCL 2 MG/2ML IJ SOLN
INTRAMUSCULAR | Status: AC
  Filled 2020-10-08: qty 2

## 2020-10-08 MED ORDER — MIDAZOLAM HCL 5 MG/5ML IJ SOLN
INTRAMUSCULAR | Status: DC | PRN
  Administered 2020-10-08: 2 mg via INTRAVENOUS

## 2020-10-08 MED ORDER — 0.9 % SODIUM CHLORIDE (POUR BTL) OPTIME
TOPICAL | Status: DC | PRN
  Administered 2020-10-08: 1000 mL

## 2020-10-08 SURGICAL SUPPLY — 54 items
BAG COUNTER SPONGE SURGICOUNT (BAG) ×2 IMPLANT
BINDER ABDOMINAL 12 ML 46-62 (SOFTGOODS) ×1 IMPLANT
BIOPATCH RED 1 DISK 7.0 (GAUZE/BANDAGES/DRESSINGS) ×1 IMPLANT
BLADE CLIPPER SURG (BLADE) IMPLANT
CANISTER SUCT 3000ML PPV (MISCELLANEOUS) ×2 IMPLANT
CHLORAPREP W/TINT 26 (MISCELLANEOUS) ×2 IMPLANT
COVER SURGICAL LIGHT HANDLE (MISCELLANEOUS) ×2 IMPLANT
DERMABOND ADVANCED (GAUZE/BANDAGES/DRESSINGS) ×1
DERMABOND ADVANCED .7 DNX12 (GAUZE/BANDAGES/DRESSINGS) IMPLANT
DRAIN CHANNEL 19F RND (DRAIN) ×1 IMPLANT
DRAPE LAPAROSCOPIC ABDOMINAL (DRAPES) ×2 IMPLANT
DRAPE WARM FLUID 44X44 (DRAPES) ×1 IMPLANT
DRSG OPSITE POSTOP 4X10 (GAUZE/BANDAGES/DRESSINGS) ×1 IMPLANT
DRSG OPSITE POSTOP 4X8 (GAUZE/BANDAGES/DRESSINGS) IMPLANT
DRSG TEGADERM 4X4.75 (GAUZE/BANDAGES/DRESSINGS) ×1 IMPLANT
ELECT BLADE 4.0 EZ CLEAN MEGAD (MISCELLANEOUS) ×2
ELECT CAUTERY BLADE 6.4 (BLADE) ×2 IMPLANT
ELECT REM PT RETURN 9FT ADLT (ELECTROSURGICAL) ×2
ELECTRODE BLDE 4.0 EZ CLN MEGD (MISCELLANEOUS) IMPLANT
ELECTRODE REM PT RTRN 9FT ADLT (ELECTROSURGICAL) ×1 IMPLANT
EVACUATOR SILICONE 100CC (DRAIN) ×1 IMPLANT
GLOVE SRG 8 PF TXTR STRL LF DI (GLOVE) ×1 IMPLANT
GLOVE SURG ENC MOIS LTX SZ8 (GLOVE) ×2 IMPLANT
GLOVE SURG UNDER POLY LF SZ8 (GLOVE) ×1
GOWN STRL REUS W/ TWL LRG LVL3 (GOWN DISPOSABLE) ×1 IMPLANT
GOWN STRL REUS W/ TWL XL LVL3 (GOWN DISPOSABLE) ×1 IMPLANT
GOWN STRL REUS W/TWL LRG LVL3 (GOWN DISPOSABLE) ×2
GOWN STRL REUS W/TWL XL LVL3 (GOWN DISPOSABLE) ×1
HANDLE SUCTION POOLE (INSTRUMENTS) ×1 IMPLANT
KIT BASIN OR (CUSTOM PROCEDURE TRAY) ×2 IMPLANT
KIT TURNOVER KIT B (KITS) ×2 IMPLANT
LIGASURE IMPACT 36 18CM CVD LR (INSTRUMENTS) IMPLANT
NDL HYPO 25GX1X1/2 BEV (NEEDLE) IMPLANT
NEEDLE HYPO 25GX1X1/2 BEV (NEEDLE) ×2 IMPLANT
NS IRRIG 1000ML POUR BTL (IV SOLUTION) ×3 IMPLANT
PACK GENERAL/GYN (CUSTOM PROCEDURE TRAY) ×2 IMPLANT
PAD ARMBOARD 7.5X6 YLW CONV (MISCELLANEOUS) ×2 IMPLANT
PENCIL SMOKE EVACUATOR (MISCELLANEOUS) ×2 IMPLANT
SPECIMEN JAR LARGE (MISCELLANEOUS) IMPLANT
SPONGE T-LAP 18X18 ~~LOC~~+RFID (SPONGE) ×1 IMPLANT
STAPLER VISISTAT 35W (STAPLE) ×1 IMPLANT
SUCTION POOLE HANDLE (INSTRUMENTS) ×2
SUT ETHILON 2 0 FS 18 (SUTURE) ×1 IMPLANT
SUT NOVA NAB DX-16 0-1 5-0 T12 (SUTURE) ×3 IMPLANT
SUT PDS AB 1 TP1 96 (SUTURE) ×2 IMPLANT
SUT VIC AB 2-0 SH 18 (SUTURE) ×2 IMPLANT
SUT VIC AB 3-0 SH 18 (SUTURE) ×2 IMPLANT
SUT VICRYL AB 2 0 TIES (SUTURE) ×1 IMPLANT
SUT VICRYL AB 3 0 TIES (SUTURE) ×1 IMPLANT
SYR CONTROL 10ML LL (SYRINGE) ×1 IMPLANT
TOWEL GREEN STERILE (TOWEL DISPOSABLE) ×2 IMPLANT
TOWEL GREEN STERILE FF (TOWEL DISPOSABLE) ×2 IMPLANT
TRAY FOLEY MTR SLVR 16FR STAT (SET/KITS/TRAYS/PACK) ×1 IMPLANT
YANKAUER SUCT BULB TIP NO VENT (SUCTIONS) ×1 IMPLANT

## 2020-10-08 NOTE — Anesthesia Postprocedure Evaluation (Signed)
Anesthesia Post Note  Patient: Leah Shea  Procedure(s) Performed: EXPLORATORY LAPAROTOMY WITH CLOSURE ABDOMINAL WALL DEFECT POSSIBLE BOWEL RESECTION (Abdomen)     Patient location during evaluation: PACU Anesthesia Type: General Level of consciousness: sedated Pain management: pain level controlled Vital Signs Assessment: post-procedure vital signs reviewed and stable Respiratory status: spontaneous breathing and respiratory function stable Cardiovascular status: stable Postop Assessment: no apparent nausea or vomiting Anesthetic complications: no   No notable events documented.  Last Vitals:  Vitals:   10/08/20 1238 10/08/20 1258  BP: (!) 162/103 (!) 155/110  Pulse: 85 92  Resp: 13 17  Temp:    SpO2: 100% 100%                  [name] DANIEL

## 2020-10-08 NOTE — Progress Notes (Signed)
Report given to short stay RN, Bronwen Betters.

## 2020-10-08 NOTE — Anesthesia Procedure Notes (Signed)
Procedure Name: Intubation Date/Time: 10/08/2020 9:29 AM Performed by: Marny Lowenstein, CRNA Pre-anesthesia Checklist: Patient identified, Emergency Drugs available, Suction available and Patient being monitored Patient Re-evaluated:Patient Re-evaluated prior to induction Oxygen Delivery Method: Circle system utilized Preoxygenation: Pre-oxygenation with 100% oxygen Induction Type: IV induction, Rapid sequence and Cricoid Pressure applied Laryngoscope Size: Miller and 2 Grade View: Grade I Tube type: Oral Tube size: 7.0 mm Number of attempts: 1 Airway Equipment and Method: Stylet Placement Confirmation: ETT inserted through vocal cords under direct vision, positive ETCO2 and breath sounds checked- equal and bilateral Secured at: 21 cm Tube secured with: Tape Dental Injury: Teeth and Oropharynx as per pre-operative assessment

## 2020-10-08 NOTE — Interval H&P Note (Signed)
History and Physical Interval Note:  10/08/2020 8:35 AM  Claretta Meshon Louks  has presented today for surgery, with the diagnosis of ABDOMINAL WALL HERNIA.  The various methods of treatment have been discussed with the patient and family. After consideration of risks, benefits and other options for treatment, the patient has consented to  Procedure(s): EXPLORATORY LAPAROTOMY WITH CLOSURE ABDOMINAL WALL DEFECT POSSIBLE BOWEL RESECTION (N/A) as a surgical intervention.  The patient's history has been reviewed, patient examined, no change in status, stable for surgery.  I have reviewed the patient's chart and labs.  Questions were answered to the patient's satisfaction.   Risk, benefits and long term expectations discuss Limited at this point to primary repair but has had multiple C sections and significant abdominal wall instability.  Would like to avoid mesh since she is not sure on future child bearing.  May need bowel resection but I suspect the likelyhood is low as well as need for colostomy.  May need a different incision as well.  The risk of hernia repair include bleeding,  Infection,   Recurrence of the hernia,  Mesh use, chronic pain,  Organ injury,  Bowel injury,  Bladder injury,   nerve injury with numbness around the incision,  Death,  and worsening of preexisting  medical problems.  The alternatives to surgery have been discussed as well..  Long term expectations of both operative and non operative treatments have been discussed.   The patient agrees to proceed.   Tinamarie Przybylski A Geno Sydnor

## 2020-10-08 NOTE — Progress Notes (Signed)
POSTPARTUM PROGRESS NOTE  POD #0  Subjective:  Leah Shea is a 35 y.o. [id number] s/p Exlap with primary closure of abdominal wall.  Pt just returned from surgery- noting some abdominal discomfort.  No nausea/vomiting. Denies fever/chills/chest pain/SOB.  no HA, no blurry vision, noRUQ pain  Objective: Blood pressure (!) 152/102, pulse 99, temperature 98.1 F (36.7 C), temperature source Oral, resp. rate 18, height 5' 6.5" (1.689 m), weight 90.7 kg, last menstrual period 01/29/2020, SpO2 99 %, unknown if currently breastfeeding.  Physical Exam:  General: alert, cooperative and no distress Chest: no respiratory distress Heart: regular rate and rhythm Abdomen: abd binder in place DVT Evaluation: No calf swelling or tenderness Extremities: no edema, SCDs in place Skin: warm, dry  Results for orders placed or performed during the hospital encounter of 10/06/20 (from the past 24 hour(s))  Glucose, capillary     Status: None   Collection Time: 10/08/20  9:13 AM  Result Value Ref Range   Glucose-Capillary 97 70 - 99 mg/dL    Assessment/Plan: Leah Shea is a 35 y.o. [id number] s/p exlap, abdominal wall closure, POD#0  -management per general surgery -Foley in place -reassured pt ok to pump -cHTN- ProcardiaXL 30mg  bid, may need to increase, will continue to closely monitor  , DO Attending Obstetrician & Gynecologist, [facility], [facility]

## 2020-10-08 NOTE — Op Note (Signed)
Preoperative diagnosis: Status post C-section postop day 10 with abdominal wall disruption  Postoperative diagnosis: Partial dehiscence of C-section closure with abdominal wall diastases and disruption  Procedure: Exploratory laparotomy with primary closure of abdominal wall  Surgeon: Erroll Luna, MD  Anesthesia: General with Exparel local 20 cc diluted with 20 cc saline  Drains: 19 round  IV fluids: Per anesthesia record  EBL: Minimal  Indications for procedure: The patient is a 35 year old female 10 days status post C-section.  She was doing well until earlier this week when she developed abdominal pain while visiting her child in the NICU.  She was taken to the MAU for evaluation.  Ultrasound revealed a mass at the area superior to her C-section incision.  CT scan showed disruption of the abdominal wall superior to the C-section incision.  She had no signs of obstruction but recommended abdominal closure in the operating room to her.  Risks and benefits of surgery were discussed.  Potential risk of hernia in the future in her case discussed given her multiple abdominal wall procedures.  She also an area of diastases noted as well not uncommon in pregnancy.  I discussed potential scenarios of bowel resection, colon resection, recurrence, and closure of her abdomen.  I was reluctant to put mesh given that she is postpartum and has quite a bit of diastases and desires more children possibly.The procedure has been discussed with the patient.  Alternative therapies have been discussed with the patient.  Operative risks include bleeding,  Infection,  Organ injury,   bowel injury, bladder injury,  wound infection, wound breakdown, Nerve injury,  Blood vessel injury,  DVT,  Pulmonary embolism,  Death,  And possible reoperation.  Medical management risks include worsening of present situation.  The success of the procedure is 50 -90 % at treating patients symptoms.  The patient understands and agrees to  proceed.     Description of procedure: The patient was met in the holding area and questions were answered.  She was taken back to the operating.  She is placed supine upon the OR table.  After induction of general esthesia the abdomen was prepped and draped in sterile fashion timeout performed.  Proper patient, site and procedure were verified.  She received appropriate preoperative antibiotics.  The old Pfannenstiel incision was opened.  The mass was superior that we were able to create a superior skin flap to track up to the umbilicus.  In the process of doing this we were able to enter what appeared to be abdominal wall defect.  I was unable to open this up in the midline toward the Pfannenstiel.  Part of this involved of the Pfannenstiel incision it appeared with traumatic disruption toward the pubic side this defect.  Above this, she had significant diastases recti.  He was difficult to discern how much of this was diastases versus disruption what appeared the but it appeared the inferior portion was more related to the Pfannenstiel incision.  In any event, was able to find the superior aspect of the rectus separation.  I was able to grasp this with a Coker.  The colon was evaluated and was intact with no signs of inflammation or injury.  There is a small medical minima reduced back in the abdominal cavity.  There were no other contents within this abdominal wall disruption.  I elected to repair this with interrupted #1 Novafil's.  The apex was identified superiorly.  We were able to approximate this and then close this defect  in a superior to inferior direction taking care not to incorporate the bladder, uterus or bowel and the closure.  I took bites of the anterior sheath and then the posterior layer as well to try to prevent any sort of muscle ischemia with this repair.  There is significant edema and trauma to the tissues make the tissue somewhat viable and friable.  We were able to close this quite  nicely with minimal tension.  I did not have to do a fascial release.  I did not want to use mesh given her postpartum timeframe and significant distortion of the abdominal wall as well as desire for possible future childbearing.  She understood the increased risk of hernia recurrence in the circumstance.  I then irrigated the subcutaneous tissues.  A 19 round drain was placed into the skin.  I then closed the Pfannenstiel incision after infiltration of the abdominal wall and subcutaneous tissues with Exparel local anesthetic.  Dermabond was placed on the incision.  Dry dressing applied.  Abdominal binder placed.  All counts were found to be correct.  The patient was awoke extubated taken to recovery in satisfactory condition.

## 2020-10-08 NOTE — Transfer of Care (Signed)
Immediate Anesthesia Transfer of Care Note  Patient: Leah Shea  Procedure(s) Performed: EXPLORATORY LAPAROTOMY WITH CLOSURE ABDOMINAL WALL DEFECT POSSIBLE BOWEL RESECTION (Abdomen)  Patient Location: PACU  Anesthesia Type:General  Level of Consciousness: awake and patient cooperative  Airway & Oxygen Therapy: Patient Spontanous Breathing and Patient connected to face mask oxygen  Post-op Assessment: Report given to RN and Post -op Vital signs reviewed and stable  Post vital signs: Reviewed and stable  Last Vitals:  Vitals Value Taken Time  BP 163/109 10/08/20 1137  Temp 36.9 C 10/08/20 1124  Pulse 87 10/08/20 1137  Resp 13 10/08/20 1137  SpO2 100 % 10/08/20 1137  Vitals shown include unvalidated device data.  Last Pain:  Vitals:   10/08/20 1124  TempSrc:   PainSc: Asleep      Patients Stated Pain Goal: 6 (10/08/20 0730)  Complications: No notable events documented.

## 2020-10-09 ENCOUNTER — Encounter (HOSPITAL_COMMUNITY): Payer: Self-pay | Admitting: Surgery

## 2020-10-09 DIAGNOSIS — K9189 Other postprocedural complications and disorders of digestive system: Secondary | ICD-10-CM | POA: Diagnosis not present

## 2020-10-09 LAB — CBC
HCT: 31.9 % — ABNORMAL LOW (ref 36.0–46.0)
Hemoglobin: 10.5 g/dL — ABNORMAL LOW (ref 12.0–15.0)
MCH: 29.4 pg (ref 26.0–34.0)
MCHC: 32.9 g/dL (ref 30.0–36.0)
MCV: 89.4 fL (ref 80.0–100.0)
Platelets: 236 10*3/uL (ref 150–400)
RBC: 3.57 MIL/uL — ABNORMAL LOW (ref 3.87–5.11)
RDW: 12.1 % (ref 11.5–15.5)
WBC: 10.1 10*3/uL (ref 4.0–10.5)
nRBC: 0 % (ref 0.0–0.2)

## 2020-10-09 LAB — BASIC METABOLIC PANEL
Anion gap: 10 (ref 5–15)
BUN: 6 mg/dL (ref 6–20)
CO2: 25 mmol/L (ref 22–32)
Calcium: 8.9 mg/dL (ref 8.9–10.3)
Chloride: 103 mmol/L (ref 98–111)
Creatinine, Ser: 0.75 mg/dL (ref 0.44–1.00)
GFR, Estimated: >60 mL/min (ref >60)
Glucose, Bld: 103 mg/dL — ABNORMAL HIGH (ref 70–99)
Potassium: 3.3 mmol/L — ABNORMAL LOW (ref 3.5–5.1)
Sodium: 138 mmol/L (ref 135–145)

## 2020-10-09 MED ORDER — METHOCARBAMOL 750 MG PO TABS
750.0000 mg | ORAL_TABLET | Freq: Four times a day (QID) | ORAL | Status: DC | PRN
  Administered 2020-10-09: 750 mg via ORAL
  Filled 2020-10-09 (×2): qty 1

## 2020-10-09 MED ORDER — ACETAMINOPHEN 500 MG PO TABS
1000.0000 mg | ORAL_TABLET | Freq: Four times a day (QID) | ORAL | Status: DC
  Administered 2020-10-09 – 2020-10-14 (×18): 1000 mg via ORAL
  Filled 2020-10-09 (×21): qty 2

## 2020-10-09 MED ORDER — SENNA 8.6 MG PO TABS
1.0000 | ORAL_TABLET | Freq: Two times a day (BID) | ORAL | Status: DC
  Filled 2020-10-09: qty 1

## 2020-10-09 MED ORDER — POTASSIUM CHLORIDE CRYS ER 20 MEQ PO TBCR
40.0000 meq | EXTENDED_RELEASE_TABLET | Freq: Once | ORAL | Status: AC
  Administered 2020-10-09: 40 meq via ORAL
  Filled 2020-10-09: qty 2

## 2020-10-09 MED ORDER — ENOXAPARIN SODIUM 60 MG/0.6ML IJ SOSY
0.5000 mg/kg | PREFILLED_SYRINGE | INTRAMUSCULAR | Status: DC
  Administered 2020-10-09 – 2020-10-14 (×6): 45 mg via SUBCUTANEOUS
  Filled 2020-10-09 (×6): qty 0.6

## 2020-10-09 MED ORDER — POLYETHYLENE GLYCOL 3350 17 G PO PACK
17.0000 g | PACK | Freq: Every day | ORAL | Status: DC | PRN
  Administered 2020-10-09: 17 g via ORAL
  Filled 2020-10-09: qty 1

## 2020-10-09 MED ORDER — SENNOSIDES-DOCUSATE SODIUM 8.6-50 MG PO TABS
2.0000 | ORAL_TABLET | Freq: Every day | ORAL | Status: DC
  Administered 2020-10-09 – 2020-10-14 (×5): 2 via ORAL
  Filled 2020-10-09 (×6): qty 2

## 2020-10-09 MED ORDER — HYDROMORPHONE HCL 1 MG/ML IJ SOLN
1.0000 mg | INTRAMUSCULAR | Status: DC | PRN
  Administered 2020-10-09 – 2020-10-11 (×6): 1 mg via INTRAVENOUS
  Filled 2020-10-09 (×6): qty 1

## 2020-10-09 NOTE — Lactation Note (Signed)
This note was copied from a baby's chart. Lactation Consultation Note  Patient Name: Leah Shea [id number] Date: 10/09/2020 Reason for consult: Follow-up assessment;Other (Comment);Late-preterm 34-36.6wks;Maternal endocrine disorder;Breast reduction;NICU baby;Infant < 6lbs (readmit) Age:35 days  Visited with mom of 53 days old LPI NICU female, baby is ready for discharge and taking most of his PO feedings (Similac 24 calorie formula) but mom is not; she had to have a hernia repair that prompted her to get readmitted to the hospital.  This [place] set up a DEBP, mom was too sick to pump yesterday when she was brought to her room at [facility] care. Mom still in pain; when [place] offered to call the RN mom voiced she's been given pain medication and she's just waiting for the medicine "to kick in".  Her main question was about pumping and dumping. Dr. Erroll Luna told her to pump and dump, but mom voiced that she's taking the same meds she did after her c/s 11 days ago. This LC tried to contact Dr. Brantley Stage on [place] but attempt was unsuccessful. Send a secure chat message through Epic and waiting to hear back.  Plan of care:   Encouraged mom to start pumping again, ideally 8 times/24 hours but she'll go at her own pace as she's still recovering from her most recent surgery Breast massage, hand expression and coconut oil were also encouraged prior pumping She'll resume taking baby to breast on feeding cues for some STS and lick & learn when able  FOB present but asleep. All questions and concerns answered, mom to follow up with NICU LC PRN.  Maternal Data   Mom's milk supply is BNL possibly due to combination of factors  Feeding Mother's Current Feeding Choice: Breast Milk and Formula Nipple Type: Dr. Myra Gianotti [place]  Lactation Tools Discussed/Used Tools: Pump;Flanges Flange Size: 24 Breast pump type: Double-Electric Breast Pump (this LC set up a new DEBP kit on 10/09/2020) Pump  Education: Setup, frequency, and cleaning;Milk Storage Reason for Pumping: LPI in NICU Pumping frequency: 3-4 times/last 24 hours due to readmission for  surgery Pumped volume: 5 mL (5-10 ml)  Interventions Interventions: Breast feeding basics reviewed;DEBP;Education;Breast massage;Hand express  Discharge    Consult Status Consult Status: Follow-up Follow-up type: In-patient    Leah Shea 10/09/2020, 11:46 AM

## 2020-10-09 NOTE — Progress Notes (Signed)
Progress Note  POD #1  Subjective:  Nyasia Meshon Nielson is a 35 y.o. [id number] s/p Exploratory laparotomy with primary closure of abdominal wall for Partial dehiscence of C-section closure with abdominal wall diastases and disruption by Dr. Luisa Hart - 10/08/20. Notes some nausea/vomiting overnight.  Able to tolerate some clears.  Notes mild headache.  No flatus, no BM.  Able to sit in chair.  Foley in place.  Notes pain medication does improve her discomfort.  Objective: Blood pressure 138/83, pulse 94, temperature 98.2 F (36.8 C), resp. rate 18, height 5' 6.5" (1.689 m), weight 90.7 kg, last menstrual period 01/29/2020, SpO2 99 %, unknown if currently breastfeeding.  Physical Exam:  General: alert, cooperative and no distress Chest: no respiratory distress, CTAB Heart: regular rate and rhythm Abdomen: soft, +BS, appropriately tender Incision: honeycomb clean and dry, JP in place with serosanguinous fluid DVT Evaluation: No calf swelling or tenderness, SCDs in place Extremities: no edema Skin: warm, dry  Results for orders placed or performed during the hospital encounter of 10/06/20 (from the past 24 hour(s))  CBC     Status: Abnormal   Collection Time: 10/09/20  8:08 AM  Result Value Ref Range   WBC 10.1 4.0 - 10.5 K/uL   RBC 3.57 (L) 3.87 - 5.11 MIL/uL   Hemoglobin 10.5 (L) 12.0 - 15.0 g/dL   HCT 16.6 (L) 06.3 - 01.6 %   MCV 89.4 80.0 - 100.0 fL   MCH 29.4 26.0 - 34.0 pg   MCHC 32.9 30.0 - 36.0 g/dL   RDW 01.0 93.2 - 35.5 %   Platelets 236 150 - 400 K/uL   nRBC 0.0 0.0 - 0.2 %  Basic metabolic panel     Status: Abnormal   Collection Time: 10/09/20  8:08 AM  Result Value Ref Range   Sodium 138 135 - 145 mmol/L   Potassium 3.3 (L) 3.5 - 5.1 mmol/L   Chloride 103 98 - 111 mmol/L   CO2 25 22 - 32 mmol/L   Glucose, Bld 103 (H) 70 - 99 mg/dL   BUN 6 6 - 20 mg/dL   Creatinine, Ser 7.32 0.44 - 1.00 mg/dL   Calcium 8.9 8.9 - 20.2 mg/dL   GFR, Estimated >54 >27 mL/min   Anion  gap 10 5 - 15    Assessment/Plan: Taffany Meshon Kobler is a 35 y.o. [id number] s/p  Exploratory laparotomy with primary closure of abdominal wall for Partial dehiscence of C-section closure with abdominal wall diastases and disruption by Dr. Luisa Hart - 10/08/20, POD#1 -management per general surgery -foley to be removed today -work on ambulation -[employer] as tolerated -ok for Lovenox for DVT prophylaxis -CHTN- doing well with Procardia XL30mg  bid -Seizure d/o- continue home medication -breastpump at bedside, reassured pt ok to pump -baby in NICU with possible discharge pending carseat    LOS: 2 days   Myna Hidalgo, DO Faculty Attending, Center for [facility] 10/09/2020, 9:33 AM

## 2020-10-09 NOTE — Progress Notes (Addendum)
ANTICOAGULATION CONSULT NOTE - Initial Consult  Pharmacy Consult for enoxaparin Indication: VTE prophylaxis  Allergies  Allergen Reactions   Other Anaphylaxis and Hives    Mushrooms   Nsaids Hives, Swelling and Other (See Comments)    Body burns    Reglan [Metoclopramide] Other (See Comments)    anxiety   Sulfa Antibiotics Other (See Comments)    burns    Patient Measurements: Height: 5' 6.5" (168.9 cm) Weight: 90.7 kg (200 lb) IBW/kg (Calculated) : 60.45   Vital Signs: Temp: 98.2 F (36.8 C) (08/26 0743) Temp Source: Oral (08/26 0445) BP: 138/83 (08/26 0743)  Labs: Recent Labs    10/06/20 2210 10/09/20 0808  HGB 10.8* 10.5*  HCT 31.9* 31.9*  PLT 247 236  CREATININE 0.83 0.75    Estimated Creatinine Clearance: 113.6 mL/min (by C-G formula based on SCr of 0.75 mg/dL).   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

## 2020-10-09 NOTE — Progress Notes (Addendum)
1 Day Post-Op  Subjective: CC: Patient reports that she is having pain near her incision and the right side of her abdomen. She tried mashed potatoes yesterday and then had an episode of emesis around 6pm. Eating did not worsen her pain but just made her feel sick on her stomach.Tolerating liquids around 9pm after this occurred without further emesis. Still nauseated this am. She is not passing flatus. No BM. Mobilized in room to chair after surgery. Foley in place. JP drain SS.   Objective: Vital signs in last 24 hours: Temp:  [98.1 F (36.7 C)-99 F (37.2 C)] 98.2 F (36.8 C) (08/26 0743) Pulse Rate:  [85-99] 94 (08/25 2000) Resp:  [10-18] 18 (08/26 0743) BP: (131-165)/(82-110) 138/83 (08/26 0743) SpO2:  [97 %-100 %] 99 % (08/26 0445) Weight:  [90.7 kg] 90.7 kg (08/25 0842) Last BM Date: 10/06/20  Intake/Output from previous day: 08/25 0701 - 08/26 0700 In: 3334.4 [P.O.:510; I.V.:2424.4; IV Piggyback:400] Out: 3625 [Urine:3575; Drains:40; Blood:10] Intake/Output this shift: Total I/O In: -  Out: 210 [Urine:200; Drains:10]  PE: Gen:  Alert, NAD, pleasant Card:  RRR Pulm:  CTAB, no W/R/R, effort normal Abd: Soft, mild distension, lower abdominal and right sided tenderness diffusely without peritonitis. Lower abdominal wound w/ honeycomb dressing in place, c/d/I. +BS.  Ext:  No LE edema or calf tenderness Psych: A&Ox3  Skin: no rashes noted, warm and dry  Lab Results:  Recent Labs    10/06/20 2210  WBC 8.5  HGB 10.8*  HCT 31.9*  PLT 247   BMET Recent Labs    10/06/20 2210  NA 138  K 3.7  CL 105  CO2 24  GLUCOSE 96  BUN 12  CREATININE 0.83  CALCIUM 9.6   PT/INR No results for input(s): LABPROT, INR in the last 72 hours. CMP     Component Value Date/Time   NA 138 10/06/2020 2210   K 3.7 10/06/2020 2210   CL 105 10/06/2020 2210   CO2 24 10/06/2020 2210   GLUCOSE 96 10/06/2020 2210   BUN 12 10/06/2020 2210   CREATININE 0.83 10/06/2020 2210    CALCIUM 9.6 10/06/2020 2210   PROT 6.2 (L) 10/06/2020 2210   ALBUMIN 2.9 (L) 10/06/2020 2210   AST 14 (L) 10/06/2020 2210   ALT 16 10/06/2020 2210   ALKPHOS 81 10/06/2020 2210   BILITOT 0.5 10/06/2020 2210   GFRNONAA >60 10/06/2020 2210   GFRAA >60 02/08/2019 1418   Lipase     Component Value Date/Time   LIPASE 23 02/08/2019 1418    Studies/Results: No results found.  Anti-infectives: Anti-infectives (From admission, onward)    Start     Dose/Rate Route Frequency Ordered Stop   10/08/20 0846  ceFAZolin (ANCEF) 2-4 GM/100ML-% IVPB       Note to Pharmacy: Launa Flight   : cabinet override      10/08/20 0846 10/08/20 2059   10/07/20 0945  ceFAZolin (ANCEF) IVPB 2g/100 mL premix        2 g 200 mL/hr over 30 Minutes Intravenous On call to O.R. 10/07/20 9622 10/08/20 0559        Assessment/Plan POD 1 s/p Exploratory laparotomy with primary closure of abdominal wall for Partial dehiscence of C-section closure with abdominal wall diastases and disruption by Dr. Luisa Hart - 10/08/20 - Adjust pain meds and wean Iv pain meds as able. Discussed with patient she will need to pump and dump given recent anesthesia  - Mobilize - Pulm toilet, IS ordered -  D/c Foley, TOV - Check labs - Continue JP drain, currently SS - Continue abdominal binder - Back down to FLD given n/v yesterday and persistent nausea this am. Has phenergan and zofran already for nausea. Can be advanced to reg diet if tolerates this am  FEN - FLD, ADAT, IVF at 126ml/hr VTE - SCDs, I have reached out to OB to see if they are okay with [place] starting Loveonx ID - Ancef peri-op Foley - d/c  Follow-Up - Dr. Luisa Hart vs DOW. Will discuss with MD.   Recent C-Section w/ 70d old baby boy - not currently breast feeding Hyperemesis gravidarum Anemia due to recent pregnancy Hypertension Seizure disorder PTSD Low albumin/pre-alb GB sludge w/o gallstones - no evidence of cholecystitis on CT. LFT's w/o elevation 8/23   LOS: 2  days    Jacinto Halim , [facility] 10/09/2020, 8:07 AM Please see Amion for pager number during day hours 7:00am-4:30pm

## 2020-10-10 LAB — BASIC METABOLIC PANEL
Anion gap: 8 (ref 5–15)
BUN: <5 mg/dL — ABNORMAL LOW (ref 6–20)
CO2: 26 mmol/L (ref 22–32)
Calcium: 9.5 mg/dL (ref 8.9–10.3)
Chloride: 104 mmol/L (ref 98–111)
Creatinine, Ser: 0.64 mg/dL (ref 0.44–1.00)
GFR, Estimated: >60 mL/min (ref >60)
Glucose, Bld: 94 mg/dL (ref 70–99)
Potassium: 3.8 mmol/L (ref 3.5–5.1)
Sodium: 138 mmol/L (ref 135–145)

## 2020-10-10 LAB — CBC
HCT: 32.7 % — ABNORMAL LOW (ref 36.0–46.0)
Hemoglobin: 11 g/dL — ABNORMAL LOW (ref 12.0–15.0)
MCH: 30.1 pg (ref 26.0–34.0)
MCHC: 33.6 g/dL (ref 30.0–36.0)
MCV: 89.3 fL (ref 80.0–100.0)
Platelets: 257 10*3/uL (ref 150–400)
RBC: 3.66 MIL/uL — ABNORMAL LOW (ref 3.87–5.11)
RDW: 12.2 % (ref 11.5–15.5)
WBC: 8.9 10*3/uL (ref 4.0–10.5)
nRBC: 0 % (ref 0.0–0.2)

## 2020-10-10 LAB — MAGNESIUM: Magnesium: 1.6 mg/dL — ABNORMAL LOW (ref 1.7–2.4)

## 2020-10-10 MED ORDER — POLYETHYLENE GLYCOL 3350 17 G PO PACK
17.0000 g | PACK | Freq: Two times a day (BID) | ORAL | Status: DC
  Administered 2020-10-10 – 2020-10-12 (×3): 17 g via ORAL
  Filled 2020-10-10 (×6): qty 1

## 2020-10-10 MED ORDER — MAGNESIUM SULFATE 2 GM/50ML IV SOLN
2.0000 g | Freq: Once | INTRAVENOUS | Status: AC
  Administered 2020-10-10: 2 g via INTRAVENOUS
  Filled 2020-10-10: qty 50

## 2020-10-10 MED ORDER — POLYETHYLENE GLYCOL 3350 17 G PO PACK: 17.0000 g | PACK | Freq: Every day | ORAL | Status: DC
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
## 2020-10-10 NOTE — Progress Notes (Signed)
Patient ID: Leah Shea, female   DOB: December 09, 1985, 35 y.o.   MRN: [id number] 2 Days Post-Op   Subjective: Nausea, did not eat yesterday ROS negative except as listed above. Objective: Vital signs in last 24 hours: Temp:  [98.4 F (36.9 C)-98.6 F (37 C)] 98.6 F (37 C) (08/26 1900) Pulse Rate:  [95-97] 96 (08/26 1900) Resp:  [16-18] 16 (08/26 1900) BP: (124-136)/(83-84) 136/84 (08/26 1900) SpO2:  [98 %-100 %] 100 % (08/26 1900) Last BM Date: 10/06/20  Intake/Output from previous day: 08/26 0701 - 08/27 0700 In: 3055.5 [P.O.:960; I.V.:2095.5] Out: 1960 [Urine:1900; Drains:60] Intake/Output this shift: No intake/output data recorded.  Abd soft, dressing CDI, binder  Lab Results: CBC  Recent Labs    10/09/20 0808 10/10/20 0435  WBC 10.1 8.9  HGB 10.5* 11.0*  HCT 31.9* 32.7*  PLT 236 257   BMET Recent Labs    10/09/20 0808 10/10/20 0435  NA 138 138  K 3.3* 3.8  CL 103 104  CO2 25 26  GLUCOSE 103* 94  BUN 6 <5*  CREATININE 0.75 0.64  CALCIUM 8.9 9.5   PT/INR No results for input(s): LABPROT, INR in the last 72 hours. ABG No results for input(s): PHART, HCO3 in the last 72 hours.  Invalid input(s): PCO2, PO2  Studies/Results: No results found.  Anti-infectives: Anti-infectives (From admission, onward)    Start     Dose/Rate Route Frequency Ordered Stop   10/08/20 0846  ceFAZolin (ANCEF) 2-4 GM/100ML-% IVPB       Note to Pharmacy: Launa Flight   : cabinet override      10/08/20 0846 10/08/20 2059   10/07/20 0945  ceFAZolin (ANCEF) IVPB 2g/100 mL premix        2 g 200 mL/hr over 30 Minutes Intravenous On call to O.R. 10/07/20 8315 10/08/20 0559       Assessment/Plan: POD 2 s/p Exploratory laparotomy with primary closure of abdominal wall for Partial dehiscence of C-section closure with abdominal wall diastases and disruption by Dr. Luisa Hart - 10/08/20 - wean Iv pain meds as able. - Mobilize - Pulm toilet, IS ordered - D/c Foley, TOV -  Check labs - Continue JP drain, currently SS - Continue abdominal binder - advance diet as tolerated  FEN - ADAT, IVF at 185ml/hr VTE - Loveonx ID - Ancef peri-op Foley - d/c  Follow-Up - Dr. Luisa Hart  Recent C-Section w/ 16d old baby boy - not currently breast feeding Hyperemesis gravidarum Anemia due to recent pregnancy Hypertension Seizure disorder PTSD Low albumin/pre-alb GB sludge w/o gallstones - no evidence of cholecystitis on CT. LFT's w/o elevation 8/23   LOS: 3 days    Violeta Gelinas, MD, MPH, FACS Trauma & General Surgery Use [email or web address] to contact on call provider  10/10/2020

## 2020-10-11 DIAGNOSIS — O10919 Unspecified pre-existing hypertension complicating pregnancy, unspecified trimester: Secondary | ICD-10-CM

## 2020-10-11 DIAGNOSIS — O24414 Gestational diabetes mellitus in pregnancy, insulin controlled: Secondary | ICD-10-CM

## 2020-10-11 LAB — CBC
HCT: 30.3 % — ABNORMAL LOW (ref 36.0–46.0)
Hemoglobin: 10 g/dL — ABNORMAL LOW (ref 12.0–15.0)
MCH: 29.8 pg (ref 26.0–34.0)
MCHC: 33 g/dL (ref 30.0–36.0)
MCV: 90.2 fL (ref 80.0–100.0)
Platelets: 266 10*3/uL (ref 150–400)
RBC: 3.36 MIL/uL — ABNORMAL LOW (ref 3.87–5.11)
RDW: 12.1 % (ref 11.5–15.5)
WBC: 7.9 10*3/uL (ref 4.0–10.5)
nRBC: 0 % (ref 0.0–0.2)

## 2020-10-11 LAB — BASIC METABOLIC PANEL
Anion gap: 10 (ref 5–15)
BUN: 6 mg/dL (ref 6–20)
CO2: 28 mmol/L (ref 22–32)
Calcium: 9.3 mg/dL (ref 8.9–10.3)
Chloride: 103 mmol/L (ref 98–111)
Creatinine, Ser: 0.71 mg/dL (ref 0.44–1.00)
GFR, Estimated: >60 mL/min (ref >60)
Glucose, Bld: 106 mg/dL — ABNORMAL HIGH (ref 70–99)
Potassium: 3.2 mmol/L — ABNORMAL LOW (ref 3.5–5.1)
Sodium: 141 mmol/L (ref 135–145)

## 2020-10-11 LAB — MAGNESIUM: Magnesium: 1.8 mg/dL (ref 1.7–2.4)

## 2020-10-11 MED ORDER — OXYCODONE HCL 5 MG PO TABS
10.0000 mg | ORAL_TABLET | ORAL | Status: DC | PRN
  Administered 2020-10-11 – 2020-10-13 (×9): 15 mg via ORAL
  Administered 2020-10-13 – 2020-10-14 (×2): 10 mg via ORAL
  Filled 2020-10-11 (×7): qty 3
  Filled 2020-10-11 (×2): qty 2
  Filled 2020-10-11 (×3): qty 3

## 2020-10-11 MED ORDER — BISACODYL 10 MG RE SUPP: 10.0000 mg | Freq: Once | RECTAL | Status: DC

## 2020-10-11 MED ORDER — OXYCODONE HCL 5 MG PO TABS: 10.0000 mg | ORAL_TABLET | ORAL | Status: DC | PRN

## 2020-10-11 MED ORDER — HYDROMORPHONE HCL 1 MG/ML IJ SOLN
0.5000 mg | INTRAMUSCULAR | Status: DC | PRN
  Administered 2020-10-12 – 2020-10-14 (×7): 0.5 mg via INTRAVENOUS
  Filled 2020-10-11 (×7): qty 0.5

## 2020-10-11 MED ORDER — POTASSIUM CHLORIDE CRYS ER 20 MEQ PO TBCR
40.0000 meq | EXTENDED_RELEASE_TABLET | Freq: Two times a day (BID) | ORAL | Status: AC
  Administered 2020-10-11 (×2): 40 meq via ORAL
  Filled 2020-10-11 (×2): qty 2

## 2020-10-11 NOTE — Progress Notes (Signed)
3 Days Post-Op  Subjective: CC: Having lower generalized abdominal pain that is worse on the lower abdomen, more on the right side. She denies any emesis yesterday and tolerated liquids. Has continued to have nausea. Denies solid PO intake. No flatus or bm. Mobilizing in the halls. Voiding but reports dysuria.   Objective: Vital signs in last 24 hours: Temp:  [98.1 F (36.7 C)-98.7 F (37.1 C)] 98.1 F (36.7 C) (08/28 0224) Pulse Rate:  [92-103] 92 (08/28 0224) Resp:  [16-18] 18 (08/28 0224) BP: (127-146)/(67-97) 127/67 (08/28 0224) SpO2:  [96 %-100 %] 100 % (08/28 0224) Last BM Date: 10/06/20  Intake/Output from previous day: 08/27 0701 - 08/28 0700 In: 2221.1 [P.O.:768; I.V.:1443.1] Out: 18 [Drains:18] Intake/Output this shift: No intake/output data recorded.  PE: Gen:  Alert, NAD, pleasant Pulm:  Normal rate and effort Abd: Soft, mild distension, appropriately tender of the lower abdomen without peritonitis. Lower abdominal wound w/ honeycomb dressing in place, c/d/I. +BS. Drain SS.   Psych: A&Ox3  Skin: no rashes noted, warm and dry  Lab Results:  Recent Labs    10/10/20 0435 10/11/20 0401  WBC 8.9 7.9  HGB 11.0* 10.0*  HCT 32.7* 30.3*  PLT 257 266   BMET Recent Labs    10/10/20 0435 10/11/20 0401  NA 138 141  K 3.8 3.2*  CL 104 103  CO2 26 28  GLUCOSE 94 106*  BUN <5* 6  CREATININE 0.64 0.71  CALCIUM 9.5 9.3   PT/INR No results for input(s): LABPROT, INR in the last 72 hours. CMP     Component Value Date/Time   NA 141 10/11/2020 0401   K 3.2 (L) 10/11/2020 0401   CL 103 10/11/2020 0401   CO2 28 10/11/2020 0401   GLUCOSE 106 (H) 10/11/2020 0401   BUN 6 10/11/2020 0401   CREATININE 0.71 10/11/2020 0401   CALCIUM 9.3 10/11/2020 0401   PROT 6.2 (L) 10/06/2020 2210   ALBUMIN 2.9 (L) 10/06/2020 2210   AST 14 (L) 10/06/2020 2210   ALT 16 10/06/2020 2210   ALKPHOS 81 10/06/2020 2210   BILITOT 0.5 10/06/2020 2210   GFRNONAA >60 10/11/2020  0401   GFRAA >60 02/08/2019 1418   Lipase     Component Value Date/Time   LIPASE 23 02/08/2019 1418    Studies/Results: No results found.  Anti-infectives: Anti-infectives (From admission, onward)    Start     Dose/Rate Route Frequency Ordered Stop   10/08/20 0846  ceFAZolin (ANCEF) 2-4 GM/100ML-% IVPB       Note to Pharmacy: Launa Flight   : cabinet override      10/08/20 0846 10/08/20 2059   10/07/20 0945  ceFAZolin (ANCEF) IVPB 2g/100 mL premix        2 g 200 mL/hr over 30 Minutes Intravenous On call to O.R. 10/07/20 1660 10/08/20 0559        Assessment/Plan POD 3 s/p Exploratory laparotomy with primary closure of abdominal wall for Partial dehiscence of C-section closure with abdominal wall diastases and disruption by Dr. Luisa Hart - 10/08/20 - wean Iv pain meds as able. - Mobilize - Pulm toilet, IS ordered - Continue JP drain, currently SS - Continue abdominal binder   FEN - Reg, bowel regimen, suppository, IVF per primary, replace K VTE - SCDs, Loveonx ID - Ancef peri-op, check UA, WBC 7.9 Foley - out Follow-Up - Dr. Luisa Hart   Recent C-Section 8/15 w/ old baby boy  Hyperemesis gravidarum Anemia due to recent pregnancy Hypertension  Seizure disorder PTSD Low albumin/pre-alb GB sludge w/o gallstones - no evidence of cholecystitis on CT. LFT's w/o elevation 8/23. WBC wnl.    LOS: 4 days    Jacinto Halim , [facility] Surgery 10/11/2020, 7:59 AM Please see Amion for pager number during day hours 7:00am-4:30pm

## 2020-10-11 NOTE — Progress Notes (Signed)
FACULTY PRACTICE ANTEPARTUM COMPREHENSIVE PROGRESS NOTE  Leah Shea is a 35 y.o. [id number] admitted for c-section pfannenstiel fasical wound deshisence s/p 8/15 rpt c-section @ 34wks. She is POD#3 s/p Gen Surg ex-lap and fasical repair (no mesh) and JP drain placement (in SQ space)  Length of Stay:  4 Days. Admitted 10/06/2020  Subjective:  She denies flatus or bowel movement. She reports pain is somewhat controlled. She is resting comfortable. She is tolerating PO.   Vitals:  Blood pressure 127/67, pulse 92, temperature 98.1 F (36.7 C), temperature source Oral, resp. rate 18, height 5' 6.5" (1.689 m), weight 90.7 kg, last menstrual period 01/29/2020, SpO2 100 %, unknown if currently breastfeeding.  I/O last 3 completed shifts: In: 3860.5 [P.O.:1248; I.V.:2602.5; Other:10] Out: 938 [Urine:900; Drains:38] No intake/output data recorded.    Physical Examination: CONSTITUTIONAL: Well-developed, well-nourished female in no acute distress.  NEUROLOGIC: Alert and oriented to person, place, and time. No cranial nerve deficit noted. PSYCHIATRIC: Normal mood and affect. Normal behavior. Normal judgment and thought content. CARDIOVASCULAR: Normal heart rate noted, regular rhythm RESPIRATORY: Effort and breath sounds normal, no problems with respiration noted MUSCULOSKELETAL: Normal range of motion. No edema and no tenderness. 2+ distal pulses. ABDOMEN: Soft, appropriately, non-distended   Current scheduled medications  acetaminophen  1,000 mg Oral Q6H   busPIRone  15 mg Oral QHS   enoxaparin (LOVENOX) injection  0.5 mg/kg Subcutaneous Q24H   escitalopram  10 mg Oral QHS   levETIRAcetam  500 mg Oral BID   metFORMIN  500 mg Oral BID WC   NIFEdipine  30 mg Oral BID   pantoprazole  40 mg Oral Daily   polyethylene glycol  17 g Oral BID   prenatal multivitamin  1 tablet Oral Q1200   senna-docusate  2 tablet Oral Q1200    I have reviewed the patient's current medications.  CBC  Latest Ref Rng & Units 10/11/2020 10/10/2020 10/09/2020  WBC 4.0 - 10.5 K/uL 7.9 8.9 10.1  Hemoglobin 12.0 - 15.0 g/dL 10.0(L) 11.0(L) 10.5(L)  Hematocrit 36.0 - 46.0 % 30.3(L) 32.7(L) 31.9(L)  Platelets 150 - 400 K/uL 266 257 236   BMP Latest Ref Rng & Units 10/11/2020 10/10/2020 10/09/2020  Glucose 70 - 99 mg/dL 751(W) 94 258(N)  BUN 6 - 20 mg/dL 6 <2(D) 6  Creatinine 7.82 - 1.00 mg/dL 4.23 5.36 1.44  Sodium 135 - 145 mmol/L 141 138 138  Potassium 3.5 - 5.1 mmol/L 3.2(L) 3.8 3.3(L)  Chloride 98 - 111 mmol/L 103 104 103  CO2 22 - 32 mmol/L 28 26 25   Calcium 8.9 - 10.3 mg/dL 9.3 9.5 8.9   Mag level 1.8   ASSESSMENT/Plan:  Pt stable *Postpartum: routine care *GI/Gen Surg: following, appreciate recs *PPx: lovenox, oob ad lib *FEN/GI: per Gen surg. On regular diet and MIVF. Bid miralax ordered. Mg ordered. Pt told to back off if nausea continues.  *cHTN: no current issues - controlled on Procardia 30 BID *seizure d/o: no current issues *Dispo: per Gen Surg dispo   Continue routine postpartum care.   , MD, FACOG Obstetrician & Gynecologist, [facility], [facility]

## 2020-10-12 ENCOUNTER — Ambulatory Visit: Payer: No Typology Code available for payment source

## 2020-10-12 LAB — BASIC METABOLIC PANEL
Anion gap: 6 (ref 5–15)
BUN: 8 mg/dL (ref 6–20)
CO2: 27 mmol/L (ref 22–32)
Calcium: 8.9 mg/dL (ref 8.9–10.3)
Chloride: 107 mmol/L (ref 98–111)
Creatinine, Ser: 0.81 mg/dL (ref 0.44–1.00)
GFR, Estimated: >60 mL/min (ref >60)
Glucose, Bld: 79 mg/dL (ref 70–99)
Potassium: 3.8 mmol/L (ref 3.5–5.1)
Sodium: 140 mmol/L (ref 135–145)

## 2020-10-12 LAB — CBC
HCT: 28.8 % — ABNORMAL LOW (ref 36.0–46.0)
Hemoglobin: 9.8 g/dL — ABNORMAL LOW (ref 12.0–15.0)
MCH: 30.2 pg (ref 26.0–34.0)
MCHC: 34 g/dL (ref 30.0–36.0)
MCV: 88.9 fL (ref 80.0–100.0)
Platelets: 237 10*3/uL (ref 150–400)
RBC: 3.24 MIL/uL — ABNORMAL LOW (ref 3.87–5.11)
RDW: 12.1 % (ref 11.5–15.5)
WBC: 6.7 10*3/uL (ref 4.0–10.5)
nRBC: 0 % (ref 0.0–0.2)

## 2020-10-12 MED ORDER — COCONUT OIL OIL
1.0000 "application " | TOPICAL_OIL | Status: DC | PRN
  Administered 2020-10-12: 1 via TOPICAL

## 2020-10-12 NOTE — Progress Notes (Signed)
Lower right hand corner around JP insertion site is draining serosanguinous fluid and has wet her abdominal binder.It appears that the binder has been bumping the tubing at the insertion site. Binder was removed and dressing was reinforced with ABD pads.

## 2020-10-12 NOTE — Progress Notes (Signed)
POSTPARTUM PROGRESS NOTE  POD #4  Subjective:  Leah Shea is a 35 y.o. [id number] s/p admitted for c-section pfannenstiel fasical wound deshisence s/p 8/15 rpt c-section @ 34wks. She is POD#4 s/p Gen Surg ex-lap and fasical repair (no mesh) and JP drain placement (in SQ space)   Today she notes that she is "trying."  She reports that her pain is still present, but medication does help.  Tolerating some diet, but does still report persistent nausea and occasional emesis.  Reports that she is still not passing flatus, no BM Denies fever/chills/chest pain/SOB.  no HA, no blurry vision, no RUQ pain  Objective: Blood pressure (!) 120/93, pulse 85, temperature 98.2 F (36.8 C), temperature source Oral, resp. rate 15, height 5' 6.5" (1.689 m), weight 90.7 kg, last menstrual period 01/29/2020, SpO2 100 %, unknown if currently breastfeeding.  Physical Exam:  General: alert, cooperative and no distress Chest: no respiratory distress, CTAB Heart: regular rate and rhythm Abdomen: soft, non-distended, appropriately tender, +BS x4 JP drain in place with serosanguinous fluid  Honey comb dressing still in place over low transverse incision DVT Evaluation: No calf swelling or tenderness Extremities: no edema Skin: warm, dry  Results for orders placed or performed during the hospital encounter of 10/06/20 (from the past 24 hour(s))  CBC     Status: Abnormal   Collection Time: 10/12/20  4:23 AM  Result Value Ref Range   WBC 6.7 4.0 - 10.5 K/uL   RBC 3.24 (L) 3.87 - 5.11 MIL/uL   Hemoglobin 9.8 (L) 12.0 - 15.0 g/dL   HCT 00.3 (L) 49.6 - 11.6 %   MCV 88.9 80.0 - 100.0 fL   MCH 30.2 26.0 - 34.0 pg   MCHC 34.0 30.0 - 36.0 g/dL   RDW 43.5 39.1 - 22.5 %   Platelets 237 150 - 400 K/uL   nRBC 0.0 0.0 - 0.2 %  Basic metabolic panel     Status: None   Collection Time: 10/12/20  4:23 AM  Result Value Ref Range   Sodium 140 135 - 145 mmol/L   Potassium 3.8 3.5 - 5.1 mmol/L   Chloride 107 98 - 111  mmol/L   CO2 27 22 - 32 mmol/L   Glucose, Bld 79 70 - 99 mg/dL   BUN 8 6 - 20 mg/dL   Creatinine, Ser 8.34 0.44 - 1.00 mg/dL   Calcium 8.9 8.9 - 62.1 mg/dL   GFR, Estimated >94 >71 mL/min   Anion gap 6 5 - 15    Assessment/Plan: Leah Shea is a 35 y.o. [id number] s/p ex-lap and fasical repair with JP placement with general surgery- POD#4  -management per general surgery -looks as though plan may be to remove JP drain -continue postop care  -Lovenox for DVT prophylaxis -cHTN- on procardia XL 30mg  bid -Seizure d/o- continue home medication   , DO Faculty Attending, [facility] 10/12/2020, 7:52 AM

## 2020-10-12 NOTE — Progress Notes (Signed)
ANTICOAGULATION CONSULT NOTE - Follow Up Consult  Pharmacy Consult for enoxaparin Indication: VTE prophylaxis  Allergies  Allergen Reactions   Other Anaphylaxis and Hives    Mushrooms   Nsaids Hives, Swelling and Other (See Comments)    Body burns    Reglan [Metoclopramide] Other (See Comments)    anxiety   Sulfa Antibiotics Other (See Comments)    burns    Patient Measurements: Height: 5' 6.5" (168.9 cm) Weight: 90.7 kg (200 lb) IBW/kg (Calculated) : 60.45  Vital Signs: Temp: 98 F (36.7 C) (08/29 0812) Temp Source: Oral (08/29 0812) BP: 126/88 (08/29 0812) Pulse Rate: 88 (08/29 0812)  Labs: Recent Labs    10/10/20 0435 10/11/20 0401 10/12/20 0423  HGB 11.0* 10.0* 9.8*  HCT 32.7* 30.3* 28.8*  PLT 257 266 237  CREATININE 0.64 0.71 0.81    Estimated Creatinine Clearance: 112.2 mL/min (by C-G formula based on SCr of 0.81 mg/dL).   Medications:  Scheduled:   acetaminophen  1,000 mg Oral Q6H   bisacodyl  10 mg Rectal Once   busPIRone  15 mg Oral QHS   enoxaparin (LOVENOX) injection  0.5 mg/kg Subcutaneous Q24H   escitalopram  10 mg Oral QHS   levETIRAcetam  500 mg Oral BID   metFORMIN  500 mg Oral BID WC   NIFEdipine  30 mg Oral BID   pantoprazole  40 mg Oral Daily   polyethylene glycol  17 g Oral BID   prenatal multivitamin  1 tablet Oral Q1200   senna-docusate  2 tablet Oral Q1200    Assessment: No s/sx of bleeding (Hgb 9.8) No s/sx of VTE/DVT (no SOB/chest pain/calf swelling/tenderness)   Plan:  Lovenox 45mg  (0.5mg /kg) sq q24hrs (for BMI > 30). Continue dose. Monitor platelets by anticoagulation protocol: Yes  Leah Shea 10/12/2020,10:45 AM

## 2020-10-12 NOTE — Discharge Instructions (Signed)
[facility]  [facility], [place]  HERNIA REPAIR: POST OP INSTRUCTIONS  Always review your discharge instruction sheet given to you by the facility where your surgery was performed. IF YOU HAVE DISABILITY OR FAMILY LEAVE FORMS, YOU MUST BRING THEM TO THE OFFICE FOR PROCESSING.   DO NOT GIVE THEM TO YOUR DOCTOR.  1. A  prescription for pain medication may be given to you upon discharge.  Take your pain medication as prescribed, if needed.  If narcotic pain medicine is not needed, then you may take acetaminophen (Tylenol) as needed. 2. Take your usually prescribed medications unless otherwise directed. If you need a refill on your pain medication, please contact your pharmacy.  They will contact our office to request authorization. Prescriptions will not be filled after 5 pm or on week-ends. 3. Diet as tolerated or recommended by your OB 4. Most patients will experience some swelling and bruising around their incision.  Ice packs and reclining will help.  Swelling and bruising can take several days to resolve.  6. It is common to experience some constipation if taking pain medication after surgery.  Increasing fluid intake and taking a stool softener (such as Colace) will usually help or prevent this problem from occurring.  A mild laxative (Miralax) should be taken according to package directions if there are no bowel movements after 48 hours. 7. Unless discharge instructions indicate otherwise you may shower at that time. If your surgeon used skin glue on the incision, you may shower in 24 hours.  The glue will flake off over the next 2-3 weeks.  Any sutures or staples will be removed at the office during your follow-up visit. 8. ACTIVITIES:  You may resume regular (light) daily activities beginning the next day--such as daily self-care, walking, climbing stairs--gradually increasing activities as tolerated.  You may have sexual intercourse when it is comfortable.  Refrain from any heavy lifting or  straining until approved by your doctor.  a.You may drive when you are no longer taking prescription pain medication, you can comfortably wear a seatbelt, and you can safely maneuver your car and apply brakes.  9.You should see your doctor in the office for a follow-up appointment approximately 2-3 weeks after your surgery.  Make sure that you call for this appointment within a day or two after you arrive home to insure a convenient appointment time. 10.OTHER INSTRUCTIONS: Please call confirm you OB before starting any new medications and whether you should pump and dump while taking narcotic pain medications  WHEN TO CALL YOUR DOCTOR: Fever over 101.0 Inability to urinate Nausea and/or vomiting Extreme swelling or bruising Continued bleeding from incision. Increased pain, redness, or drainage from the incision  The clinic staff is available to answer your questions during regular business hours.  Please don't hesitate to call and ask to speak to one of the nurses for clinical concerns.  If you have a medical emergency, go to the nearest emergency room or call 911.  A surgeon from [facility] is always on call at the hospital   [address], [address], [place], [place]  [address] ?  [address] [address], [place], [place]   [address] [phone] ? [phone] ? FAX [phone] Web site: [email or web address]

## 2020-10-12 NOTE — Progress Notes (Signed)
Encouraged patient to ambulate in hall to increase bowel motility. Also encouraged her to take Dulcolax suppository as well. Leah Shea stated she would let me know when she would take suppository sometime this afternoon and she planned to walk in the hall later this afternoon.

## 2020-10-12 NOTE — Progress Notes (Signed)
Patient ID: Leah Shea, female   DOB: 1986/02/07, 35 y.o.   MRN: [id number] 4 Days Post-Op   Subjective: Reports some leakage around JP No BM ROS negative except as listed above. Objective: Vital signs in last 24 hours: Temp:  [98 F (36.7 C)-98.4 F (36.9 C)] 98.2 F (36.8 C) (08/29 0425) Pulse Rate:  [84-86] 85 (08/29 0425) Resp:  [15-19] 15 (08/29 0425) BP: (120-147)/(82-93) 120/93 (08/29 0425) SpO2:  [94 %-100 %] 100 % (08/29 0425) Last BM Date: 10/06/20  Intake/Output from previous day: 08/28 0701 - 08/29 0700 In: -  Out: 22 [Drains:22] Intake/Output this shift: No intake/output data recorded.  General appearance: alert and cooperative GI: soft, incision OK, JP SS   Lab Results: CBC  Recent Labs    10/11/20 0401 10/12/20 0423  WBC 7.9 6.7  HGB 10.0* 9.8*  HCT 30.3* 28.8*  PLT 266 237   BMET Recent Labs    10/11/20 0401 10/12/20 0423  NA 141 140  K 3.2* 3.8  CL 103 107  CO2 28 27  GLUCOSE 106* 79  BUN 6 8  CREATININE 0.71 0.81  CALCIUM 9.3 8.9   PT/INR No results for input(s): LABPROT, INR in the last 72 hours. ABG No results for input(s): PHART, HCO3 in the last 72 hours.  Invalid input(s): PCO2, PO2  Studies/Results: No results found.  Anti-infectives: Anti-infectives (From admission, onward)    Start     Dose/Rate Route Frequency Ordered Stop   10/08/20 0846  ceFAZolin (ANCEF) 2-4 GM/100ML-% IVPB       Note to Pharmacy: Launa Flight   : cabinet override      10/08/20 0846 10/08/20 2059   10/07/20 0945  ceFAZolin (ANCEF) IVPB 2g/100 mL premix        2 g 200 mL/hr over 30 Minutes Intravenous On call to O.R. 10/07/20 0852 10/08/20 0559       Assessment/Plan: S/P Exploratory laparotomy with primary closure of abdominal wall for Partial dehiscence of C-section closure with abdominal wall diastases and disruption by Dr. Luisa Hart - 10/08/20 - Mobilize - D/C JP today - Bowel regimen   FEN - Reg, bowel regimen, suppository, IVF  per primary VTE - SCDs, Loveonx ID - Ancef peri-op, check UA, WBC 7.9 Foley - out Follow-Up - Dr. Luisa Hart   Recent C-Section 8/15 w/ baby boy  Hyperemesis gravidarum Anemia due to recent pregnancy Hypertension Seizure disorder PTSD Low albumin/pre-alb GB sludge w/o gallstones - no evidence of cholecystitis   LOS: 5 days    Violeta Gelinas, MD, MPH, FACS Trauma & General Surgery Use [email or web address] to contact on call provider  10/12/2020

## 2020-10-13 LAB — URINALYSIS, COMPLETE (UACMP) WITH MICROSCOPIC
Bilirubin Urine: NEGATIVE
Glucose, UA: NEGATIVE mg/dL
Ketones, ur: NEGATIVE mg/dL
Leukocytes,Ua: NEGATIVE
Nitrite: NEGATIVE
Protein, ur: NEGATIVE mg/dL
Specific Gravity, Urine: 1.012 (ref 1.005–1.030)
pH: 6 (ref 5.0–8.0)

## 2020-10-13 MED ORDER — METOCLOPRAMIDE HCL 5 MG/ML IJ SOLN: 5.0000 mg | Freq: Four times a day (QID) | INTRAMUSCULAR | Status: DC

## 2020-10-13 MED ORDER — DOCUSATE SODIUM 100 MG PO CAPS
100.0000 mg | ORAL_CAPSULE | Freq: Two times a day (BID) | ORAL | Status: DC
  Filled 2020-10-13: qty 1

## 2020-10-13 MED ORDER — MAGNESIUM HYDROXIDE 400 MG/5ML PO SUSP: 30.0000 mL | Freq: Once | ORAL | Status: DC

## 2020-10-13 NOTE — Progress Notes (Signed)
5 Days Post-Op Procedure(s) (LRB): EXPLORATORY LAPAROTOMY WITH CLOSURE ABDOMINAL WALL DEFECT POSSIBLE BOWEL RESECTION (N/A) Still has reflux and nausea, diarrhea  Subjective: Patient reports nausea, vomiting, and + BM.    Objective: I have reviewed patient's vital signs and medications. Blood pressure 124/78, pulse 87, temperature 98.4 F (36.9 C), temperature source Oral, resp. rate 16, height 5' 6.5" (1.689 m), weight 90.7 kg, last menstrual period 01/29/2020, SpO2 97 %, unknown if currently breastfeeding.  General: alert, cooperative, and no distress GI: incision: clean, dry, and intact Extremities: extremities normal, atraumatic, no cyanosis or edema Vaginal Bleeding: minimal  Assessment: s/p Procedure(s): EXPLORATORY LAPAROTOMY WITH CLOSURE ABDOMINAL WALL DEFECT POSSIBLE BOWEL RESECTION (N/A): not tolerating diet and reflux. Will add metaclopramide  Plan: Encourage ambulation Clear liquids  LOS: 6 days    Scheryl Darter 10/13/2020, 10:22 AM

## 2020-10-13 NOTE — Progress Notes (Signed)
   General Surgery Follow Up Note  Subjective:    Overnight Issues:   Objective:  Vital signs for last 24 hours: Temp:  [97.8 F (36.6 C)-98.5 F (36.9 C)] 98.3 F (36.8 C) (08/30 0449) Pulse Rate:  [86-89] 86 (08/30 0449) Resp:  [16-18] 16 (08/30 0449) BP: (121-144)/(72-89) 124/79 (08/30 0449) SpO2:  [96 %-100 %] 96 % (08/30 0449)  Hemodynamic parameters for last 24 hours:    Intake/Output from previous day: 08/29 0701 - 08/30 0700 In: 5622.6 [I.V.:5372.6; IV Piggyback:250] Out: -   Intake/Output this shift: No intake/output data recorded.  Vent settings for last 24 hours:    Physical Exam:  Gen: comfortable, no distress Neuro: non-focal exam HEENT: PERRL Neck: supple CV: RRR Pulm: unlabored breathing Abd: soft, appropriately TTP, incision c/d/I, drain site hemostatic, no BM GU: spont voids Extr: wwp, no edema   Results for orders placed or performed during the hospital encounter of 10/06/20 (from the past 24 hour(s))  Urinalysis, Complete w Microscopic     Status: Abnormal   Collection Time: 10/12/20 11:40 PM  Result Value Ref Range   Color, Urine YELLOW YELLOW   APPearance HAZY (A) CLEAR   Specific Gravity, Urine 1.012 1.005 - 1.030   pH 6.0 5.0 - 8.0   Glucose, UA NEGATIVE NEGATIVE mg/dL   Hgb urine dipstick LARGE (A) NEGATIVE   Bilirubin Urine NEGATIVE NEGATIVE   Ketones, ur NEGATIVE NEGATIVE mg/dL   Protein, ur NEGATIVE NEGATIVE mg/dL   Nitrite NEGATIVE NEGATIVE   Leukocytes,Ua NEGATIVE NEGATIVE   RBC / HPF 6-10 0 - 5 RBC/hpf   Bacteria, UA RARE (A) NONE SEEN   Squamous Epithelial / LPF 0-5 0 - 5   Mucus PRESENT     Assessment & Plan:  Present on Admission:  Post-operative complication    LOS: 6 days   Additional comments:I reviewed the patient's new clinical lab test results.   and I reviewed the patients new imaging test results.    S/P Exploratory laparotomy with primary closure of abdominal wall for Partial dehiscence of C-section  closure with abdominal wall diastases and disruption by Dr. Luisa Hart - 10/08/20 - Mobilize - Bowel regimen escalated: added colace and MoM - stable for d/c after BM   FEN - Reg, bowel regimen, suppository, IVF per primary VTE - SCDs, Loveonx ID - Ancef peri-op, check UA, WBC 7.9 Foley - out Follow-Up - Dr. Luisa Hart   Recent C-Section 8/15 w/ baby boy  Hyperemesis gravidarum Anemia due to recent pregnancy Hypertension Seizure disorder PTSD Low albumin/pre-alb GB sludge w/o gallstones - no evidence of cholecystitis    Diamantina Monks, MD Trauma & General Surgery Please use [email or web address] to contact on call provider  10/13/2020  *Care during the described time interval was provided by me. I have reviewed this patient's available data, including medical history, events of note, physical examination and test results as part of my evaluation.

## 2020-10-14 MED ORDER — CELECOXIB 100 MG PO CAPS
100.0000 mg | ORAL_CAPSULE | Freq: Two times a day (BID) | ORAL | Status: DC
  Administered 2020-10-14: 100 mg via ORAL
  Filled 2020-10-14 (×3): qty 1

## 2020-10-14 MED ORDER — DIPHENHYDRAMINE HCL 50 MG/ML IJ SOLN: 25.0000 mg | Freq: Four times a day (QID) | INTRAMUSCULAR | Status: DC | PRN

## 2020-10-14 MED ORDER — CELECOXIB 100 MG PO CAPS: 100.0000 mg | ORAL_CAPSULE | Freq: Two times a day (BID) | ORAL | 0 refills | Status: DC

## 2020-10-14 MED ORDER — NIFEDIPINE ER 60 MG PO TB24: 60.0000 mg | ORAL_TABLET | Freq: Every day | ORAL | 0 refills | Status: DC

## 2020-10-14 MED ORDER — OXYCODONE HCL 10 MG PO TABS: 10.0000 mg | ORAL_TABLET | ORAL | 0 refills | Status: DC | PRN

## 2020-10-14 NOTE — Discharge Summary (Signed)
Gynecology Physician Postoperative Discharge Summary  Patient ID: Leah Shea MRN: [id number] DOB/AGE: 1985/06/16 35 y.o.  Admit Date: 10/06/2020 Discharge Date: 10/14/2020  Preoperative Diagnoses: surgical wound dehiscence   Procedures: Procedure(s) (LRB): EXPLORATORY LAPAROTOMY WITH CLOSURE ABDOMINAL WALL DEFECT POSSIBLE BOWEL RESECTION (N/A)  Hospital Course:  Leah Shea is a 36 y.o. [id number]  postoperative complication 9 days after repeat cesarean section 09/28/20.  She presented to maternity admissions reporting abdominal pain in RUQ/lower rib area and just below umbilicus. States has been there since surgery and is unrelieved by analgesics.  Also has headache, unrelieved by meds.  Took Tylenol and 1 Oxy at 4pm.  She was diagnosed with wound dehiscence and had repair by Dr. Luisa Hart. . For further details about surgery, please refer to the operative report. Patient had an uncomplicated postoperative course. By time of discharge on POD#6, her pain was controlled on oral pain medications; she was ambulating, voiding without difficulty, tolerating regular diet and passing flatus with some diarrhea She was deemed stable for discharge to home.   Significant Labs: CBC Latest Ref Rng & Units 10/12/2020 10/11/2020 10/10/2020  WBC 4.0 - 10.5 K/uL 6.7 7.9 8.9  Hemoglobin 12.0 - 15.0 g/dL 5.6(D) 10.0(L) 11.0(L)  Hematocrit 36.0 - 46.0 % 28.8(L) 30.3(L) 32.7(L)  Platelets 150 - 400 K/uL 237 266 257    Discharge Exam: Blood pressure (!) 140/95, pulse 91, temperature 98.4 F (36.9 C), temperature source Oral, resp. rate 18, height 5' 6.5" (1.689 m), weight 90.7 kg, last menstrual period 01/29/2020, SpO2 100 %, unknown if currently breastfeeding. General appearance: alert and no distress  Resp: clear to auscultation bilaterally  Cardio: regular rate and rhythm  GI: soft, non-tender; bowel sounds normal; no masses, no organomegaly.  Incision: C/D/I, no erythema, no drainage  noted Pelvic: scant blood on pad  Extremities: extremities normal, atraumatic, no cyanosis or edema and Homans sign is negative, no sign of DVT  Discharged Condition: Stable  Disposition:  Discharge disposition: 01-Home or Self Care      Discharge Instructions     Discharge patient   Complete by: As directed    Discharge disposition: 01-Home or Self Care   Discharge patient date: 10/14/2020      Allergies as of 10/14/2020       Reactions   Other Anaphylaxis, Hives   Mushrooms   Nsaids Hives, Swelling, Other (See Comments)   Body burns   Reglan [metoclopramide] Other (See Comments)   anxiety   Sulfa Antibiotics Other (See Comments)   burns        Medication List     STOP taking these medications    Acetaminophen Extra Strength 500 MG tablet Generic drug: acetaminophen   labetalol 200 MG tablet Commonly known as: NORMODYNE       TAKE these medications    busPIRone 15 MG tablet Commonly known as: BUSPAR Take by mouth.   celecoxib 100 MG capsule Commonly known as: CELEBREX Take 1 capsule (100 mg total) by mouth 2 (two) times daily.   CertaVite/Antioxidants Tabs Take 1 tablet by mouth daily at 12 noon.   cyclobenzaprine 10 MG tablet Commonly known as: FLEXERIL Take 1 tablet (10 mg total) by mouth 3 (three) times daily as needed (back pain/muscle pain).   escitalopram 10 MG tablet Commonly known as: LEXAPRO Take 1 tablet (10 mg total) by mouth daily.   hydrOXYzine 25 MG capsule Commonly known as: VISTARIL Take 2 capsules (50 mg total) by mouth 3 (three) times daily as needed  for anxiety.   levETIRAcetam 500 MG tablet Commonly known as: KEPPRA Take 1 tablet (500 mg total) by mouth 2 (two) times daily.   LORazepam 0.5 MG tablet Commonly known as: ATIVAN Take 1 tablet (0.5 mg total) by mouth every 6 (six) hours as needed for anxiety or sleep.   metFORMIN 500 MG tablet Commonly known as: GLUCOPHAGE Take 1 tablet (500 mg total) by mouth 2 (two)  times daily with a meal.   NIFEdipine 60 MG 24 hr tablet Commonly known as: ADALAT CC Take 1 tablet (60 mg total) by mouth daily.   Oxycodone HCl 10 MG Tabs Take 1-1.5 tablets (10-15 mg total) by mouth every 4 (four) hours as needed for severe pain or moderate pain (10mg  for moderate pain,15mg  for severe pain). What changed:  medication strength how much to take reasons to take this   pantoprazole 40 MG tablet Commonly known as: Protonix Take 1 tablet (40 mg total) by mouth daily.        Follow-up Information     , MD. Call on 11/13/2020.   Specialty: General Surgery Why: 9am. Please bring a copy of your photo ID and insurance card. Please arrive 30 minutes prior to your appointment for paperwork. Contact information: [address] [place] [phone]         [facility] Follow up in 3 week(s).   Specialty: Obstetrics and Gynecology Contact information: [address] [place] [phone]                Signed: [phone], MD, FACOG Obstetrician & Gynecologist Faculty Practice, [facility] - [facility]

## 2020-10-14 NOTE — Progress Notes (Signed)
Progress Note  6 Days Post-Op  Subjective: CC: abdominal cramping and headache Persistent but improving nausea. Had several loose bowel movements. Emesis x1 yesterday but emesis frequency also improved. Low appetite. Ambulating.  Objective: Vital signs in last 24 hours: Temp:  [98.1 F (36.7 C)-98.7 F (37.1 C)] 98.2 F (36.8 C) (08/30 2341) Pulse Rate:  [87-91] 87 (08/30 2341) Resp:  [16-18] 18 (08/30 2341) BP: (124-133)/(78-84) 133/83 (08/30 2341) SpO2:  [96 %-99 %] 96 % (08/30 2341) Last BM Date: 10/06/20  Intake/Output from previous day: No intake/output data recorded. Intake/Output this shift: No intake/output data recorded.  PE: General: pleasant, WD, female who is laying in bed in NAD HEENT: head is normocephalic, atraumatic.  Mouth is pink and moist Heart: Palpable radial pulses Lungs: Respiratory effort nonlabored Abd: soft, +BS, very mild distension. Mild to moderate TTP over RLQ. Lower abdominal incision clean with very superficial opening of skin towards midline of incision. No active drainage from incision or drain site. No erythema or induration Skin: warm and dry with no masses, lesions, or rashes Psych: A&Ox3 with an appropriate affect.    Lab Results:  Recent Labs    10/12/20 0423  WBC 6.7  HGB 9.8*  HCT 28.8*  PLT 237   BMET Recent Labs    10/12/20 0423  NA 140  K 3.8  CL 107  CO2 27  GLUCOSE 79  BUN 8  CREATININE 0.81  CALCIUM 8.9   PT/INR No results for input(s): LABPROT, INR in the last 72 hours. CMP     Component Value Date/Time   NA 140 10/12/2020 0423   K 3.8 10/12/2020 0423   CL 107 10/12/2020 0423   CO2 27 10/12/2020 0423   GLUCOSE 79 10/12/2020 0423   BUN 8 10/12/2020 0423   CREATININE 0.81 10/12/2020 0423   CALCIUM 8.9 10/12/2020 0423   PROT 6.2 (L) 10/06/2020 2210   ALBUMIN 2.9 (L) 10/06/2020 2210   AST 14 (L) 10/06/2020 2210   ALT 16 10/06/2020 2210   ALKPHOS 81 10/06/2020 2210   BILITOT 0.5 10/06/2020 2210    GFRNONAA >60 10/12/2020 0423   GFRAA >60 02/08/2019 1418   Lipase     Component Value Date/Time   LIPASE 23 02/08/2019 1418       Studies/Results: No results found.  Anti-infectives: Anti-infectives (From admission, onward)    Start     Dose/Rate Route Frequency Ordered Stop   10/08/20 0846  ceFAZolin (ANCEF) 2-4 GM/100ML-% IVPB       Note to Pharmacy: Launa Flight   : cabinet override      10/08/20 0846 10/08/20 2059   10/07/20 0945  ceFAZolin (ANCEF) IVPB 2g/100 mL premix        2 g 200 mL/hr over 30 Minutes Intravenous On call to O.R. 10/07/20 0852 10/08/20 0559        Assessment/Plan S/P Exploratory laparotomy with primary closure of abdominal wall for Partial dehiscence of C-section closure with abdominal wall diastases and disruption by Dr. Luisa Hart - 10/08/20 - POD6  - continue to mobilize - scheduled robaxin - having bowel function with BM. Stable for discharge from our perspective. She states she does not feel ready for discharge today and is still utilizing IV pain medication - continue bowel regimen   FEN - Reg, bowel regimen, IVF per primary VTE - SCDs, Loveonx ID - Ancef peri-op Foley - out Follow-Up - Dr. Luisa Hart   Recent C-Section 8/15 w/ baby boy  Hyperemesis gravidarum Anemia due  to recent pregnancy Hypertension Seizure disorder PTSD Low albumin/pre-alb GB sludge w/o gallstones - no evidence of cholecystitis   LOS: 7 days    Eric Form, [facility] 10/14/2020, 7:35 AM Please see Amion for pager number during day hours 7:00am-4:30pm

## 2020-10-14 NOTE — Plan of Care (Signed)
  Problem: Clinical Measurements: Goal: Will remain free from infection Outcome: Completed/Met   Problem: Activity: Goal: Risk for activity intolerance will decrease Outcome: Completed/Met   Problem: Nutrition: Goal: Adequate nutrition will be maintained Outcome: Completed/Met

## 2020-10-20 ENCOUNTER — Other Ambulatory Visit: Payer: Self-pay | Admitting: Obstetrics & Gynecology

## 2020-11-02 ENCOUNTER — Encounter (HOSPITAL_COMMUNITY): Payer: Self-pay | Admitting: Emergency Medicine

## 2020-11-02 ENCOUNTER — Emergency Department (HOSPITAL_COMMUNITY): Payer: No Typology Code available for payment source

## 2020-11-02 ENCOUNTER — Other Ambulatory Visit: Payer: Self-pay

## 2020-11-02 ENCOUNTER — Emergency Department (HOSPITAL_COMMUNITY)
Admission: AD | Admit: 2020-11-02 | Discharge: 2020-11-02 | Disposition: A | Discharge status: Home / Self Care | Payer: No Typology Code available for payment source | Attending: Obstetrics and Gynecology | Admitting: Obstetrics and Gynecology

## 2020-11-02 DIAGNOSIS — J45909 Unspecified asthma, uncomplicated: Secondary | ICD-10-CM | POA: Insufficient documentation

## 2020-11-02 DIAGNOSIS — R1030 Lower abdominal pain, unspecified: Secondary | ICD-10-CM | POA: Diagnosis present

## 2020-11-02 DIAGNOSIS — R112 Nausea with vomiting, unspecified: Secondary | ICD-10-CM | POA: Insufficient documentation

## 2020-11-02 DIAGNOSIS — T8140XA Infection following a procedure, unspecified, initial encounter: Secondary | ICD-10-CM

## 2020-11-02 DIAGNOSIS — Z87891 Personal history of nicotine dependence: Secondary | ICD-10-CM | POA: Insufficient documentation

## 2020-11-02 DIAGNOSIS — N9489 Other specified conditions associated with female genital organs and menstrual cycle: Secondary | ICD-10-CM | POA: Diagnosis not present

## 2020-11-02 LAB — COMPREHENSIVE METABOLIC PANEL
ALT: 14 U/L (ref 0–44)
AST: 13 U/L — ABNORMAL LOW (ref 15–41)
Albumin: 3.6 g/dL (ref 3.5–5.0)
Alkaline Phosphatase: 79 U/L (ref 38–126)
Anion gap: 9 (ref 5–15)
BUN: 12 mg/dL (ref 6–20)
CO2: 20 mmol/L — ABNORMAL LOW (ref 22–32)
Calcium: 9.5 mg/dL (ref 8.9–10.3)
Chloride: 110 mmol/L (ref 98–111)
Creatinine, Ser: 0.9 mg/dL (ref 0.44–1.00)
GFR, Estimated: >60 mL/min (ref >60)
Glucose, Bld: 103 mg/dL — ABNORMAL HIGH (ref 70–99)
Potassium: 3.9 mmol/L (ref 3.5–5.1)
Sodium: 139 mmol/L (ref 135–145)
Total Bilirubin: 0.4 mg/dL (ref 0.3–1.2)
Total Protein: 6.9 g/dL (ref 6.5–8.1)

## 2020-11-02 LAB — LIPASE, BLOOD: Lipase: 25 U/L (ref 11–51)

## 2020-11-02 LAB — URINALYSIS, ROUTINE W REFLEX MICROSCOPIC
Bilirubin Urine: NEGATIVE
Glucose, UA: NEGATIVE mg/dL
Ketones, ur: NEGATIVE mg/dL
Leukocytes,Ua: NEGATIVE
Nitrite: NEGATIVE
Protein, ur: NEGATIVE mg/dL
Specific Gravity, Urine: 1.015 (ref 1.005–1.030)
pH: 5.5 (ref 5.0–8.0)

## 2020-11-02 LAB — TROPONIN I (HIGH SENSITIVITY)
Troponin I (High Sensitivity): 3 ng/L (ref <18)
Troponin I (High Sensitivity): 5 ng/L (ref <18)

## 2020-11-02 LAB — CBC WITH DIFFERENTIAL/PLATELET
Abs Immature Granulocytes: 0.01 10*3/uL (ref 0.00–0.07)
Basophils Absolute: 0.1 10*3/uL (ref 0.0–0.1)
Basophils Relative: 1 %
Eosinophils Absolute: 0.2 10*3/uL (ref 0.0–0.5)
Eosinophils Relative: 3 %
HCT: 38.8 % (ref 36.0–46.0)
Hemoglobin: 12.8 g/dL (ref 12.0–15.0)
Immature Granulocytes: 0 %
Lymphocytes Relative: 42 %
Lymphs Abs: 2.7 10*3/uL (ref 0.7–4.0)
MCH: 29 pg (ref 26.0–34.0)
MCHC: 33 g/dL (ref 30.0–36.0)
MCV: 88 fL (ref 80.0–100.0)
Monocytes Absolute: 0.4 10*3/uL (ref 0.1–1.0)
Monocytes Relative: 6 %
Neutro Abs: 3.1 10*3/uL (ref 1.7–7.7)
Neutrophils Relative %: 48 %
Platelets: 272 10*3/uL (ref 150–400)
RBC: 4.41 MIL/uL (ref 3.87–5.11)
RDW: 12.5 % (ref 11.5–15.5)
WBC: 6.4 10*3/uL (ref 4.0–10.5)
nRBC: 0 % (ref 0.0–0.2)

## 2020-11-02 LAB — URINALYSIS, MICROSCOPIC (REFLEX): Bacteria, UA: NONE SEEN

## 2020-11-02 LAB — LACTIC ACID, PLASMA
Lactic Acid, Venous: 0.8 mmol/L (ref 0.5–1.9)
Lactic Acid, Venous: 0.8 mmol/L (ref 0.5–1.9)

## 2020-11-02 LAB — I-STAT BETA HCG BLOOD, ED (MC, WL, AP ONLY): I-stat hCG, quantitative: <5 m[IU]/mL (ref <5)

## 2020-11-02 IMAGING — DX DG CHEST 2V
2 series · 2 of 2 positions shown · non-contrast
Comparison: [DATE]

CLINICAL DATA: Shortness of breath.  Dysuria.  Fever.

EXAM:
CHEST - 2 VIEW

[chest pa]
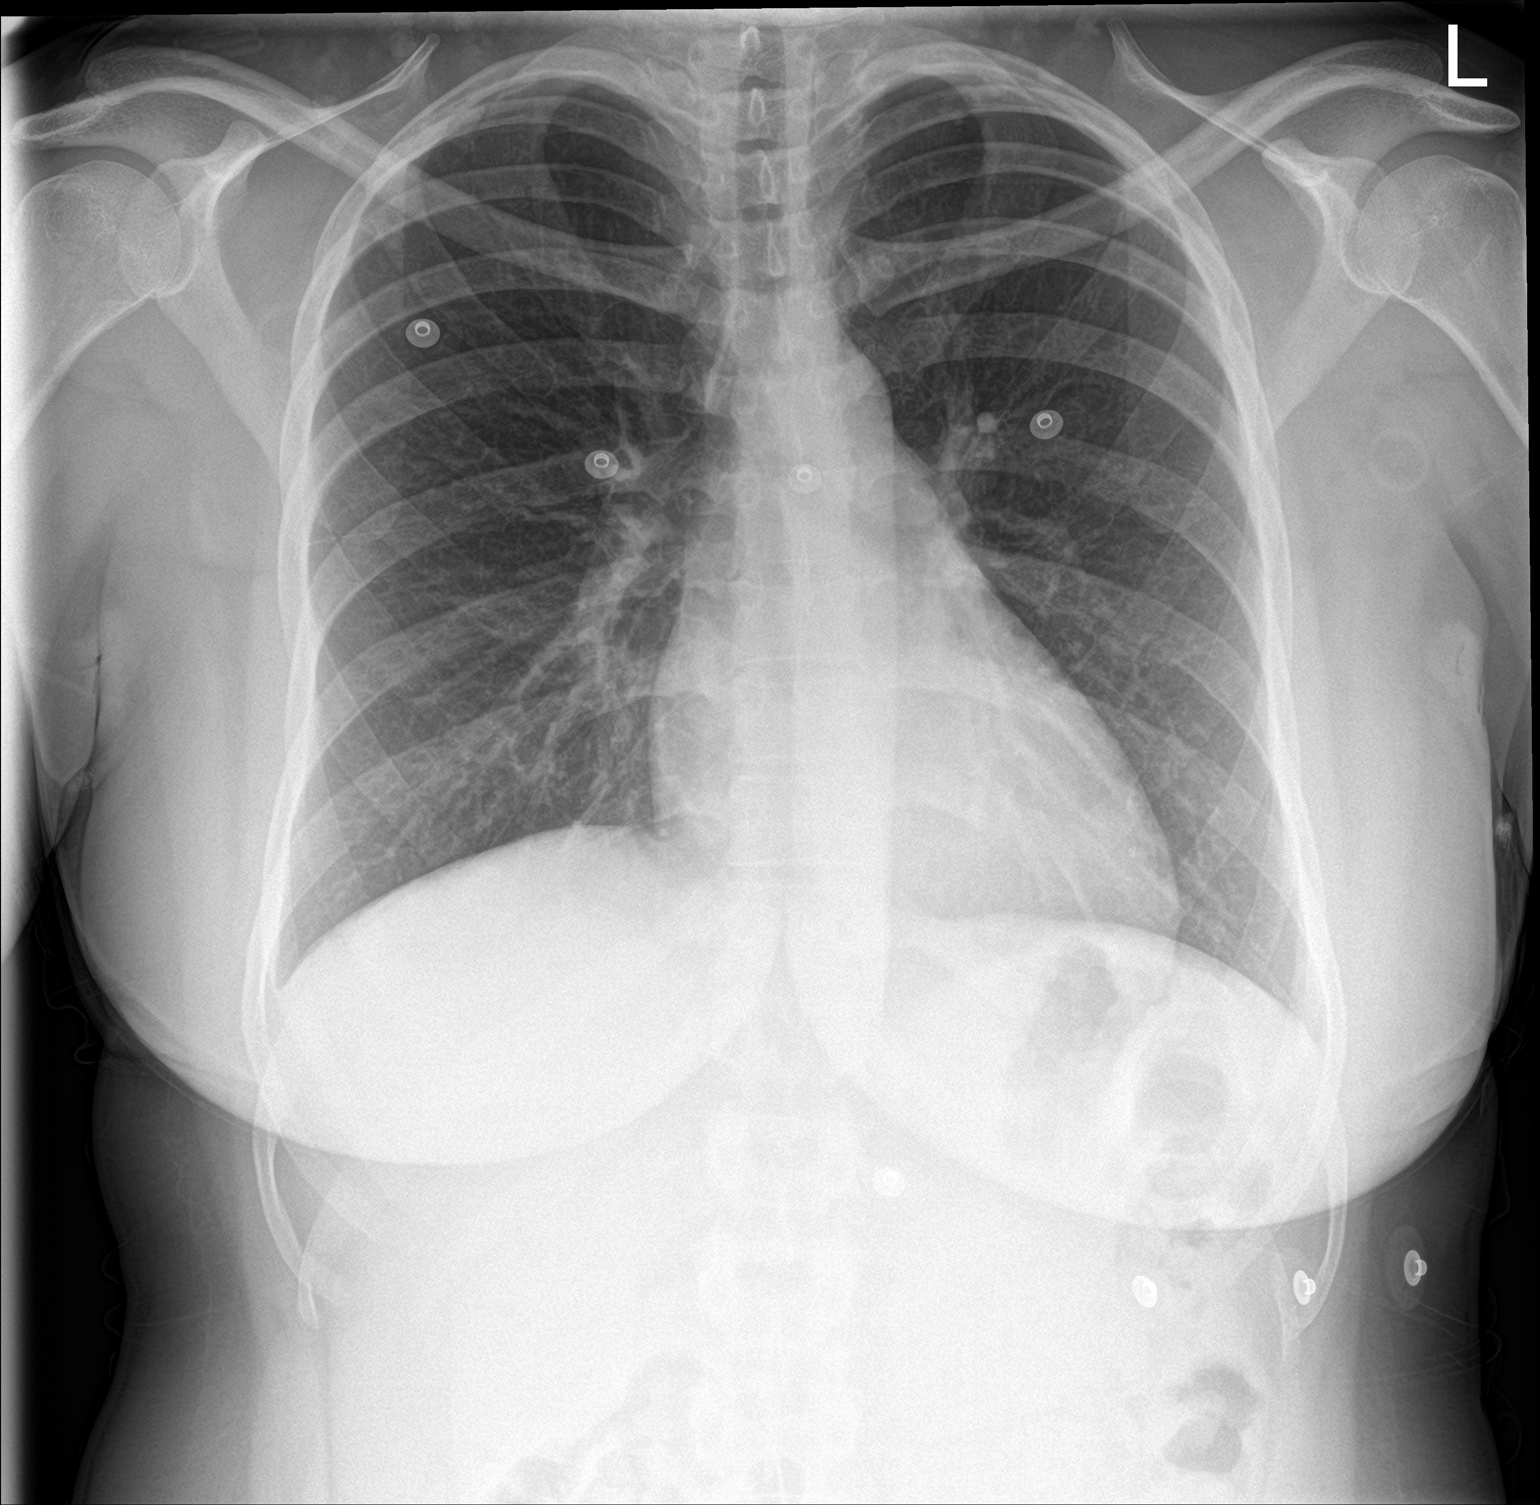

[chest lat]
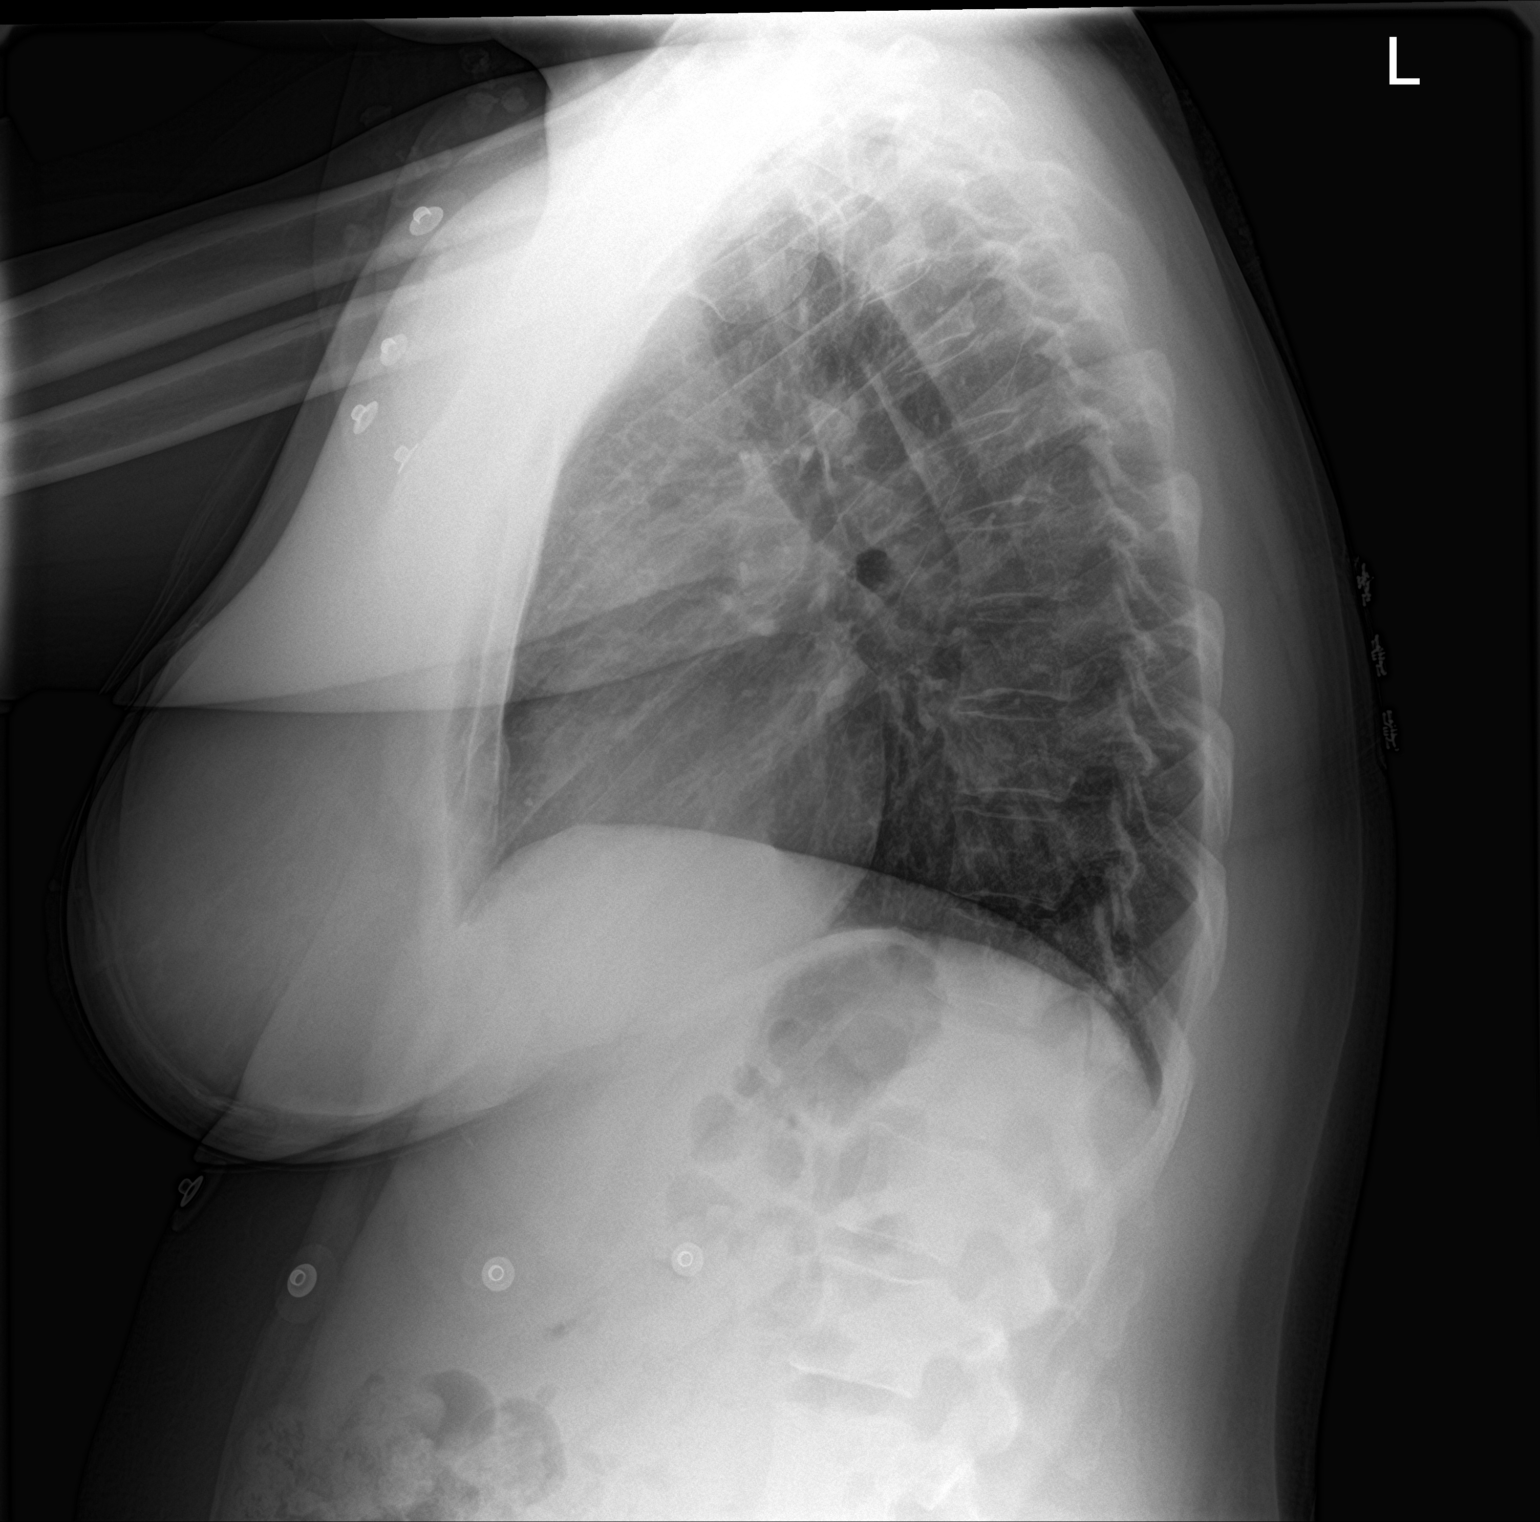

[2 of 2 positions shown; findings below may reference images not displayed]

FINDINGS: Heart size is normal. Mediastinal shadows are normal. The lungs are
clear. No bronchial thickening. No infiltrate, mass, effusion or
collapse. Pulmonary vascularity is normal. No bony abnormality.
IMPRESSION: Normal chest

## 2020-11-02 IMAGING — CT CT ABD-PELV W/ CM
2 of 4 series · 16 of 46 positions shown, 18 images · IV contrast (omnipaque)
Comparison: [DATE]

CLINICAL DATA: Abdominal pain. Status post hernia repair on [DATE].

EXAM:
CT ABDOMEN AND PELVIS WITH CONTRAST
TECHNIQUE: Multidetector CT imaging of the abdomen and pelvis was performed
using the standard protocol following bolus administration of
intravenous contrast.
CONTRAST:  100mL OMNIPAQUE IOHEXOL 300 MG/ML  SOLN

[Series 3: abdomen 5.0 · axial · 0.98mm/px · z∈[+779,+1184]mm · 13 of 93 slices shown, 15 images]
[im 6/93  soft-tissue]
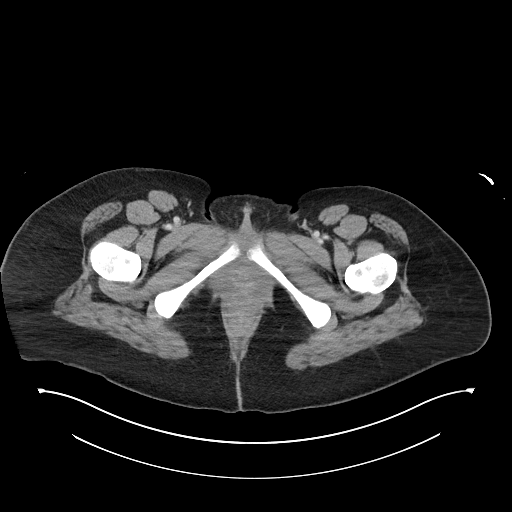
[im 6/93  bone]
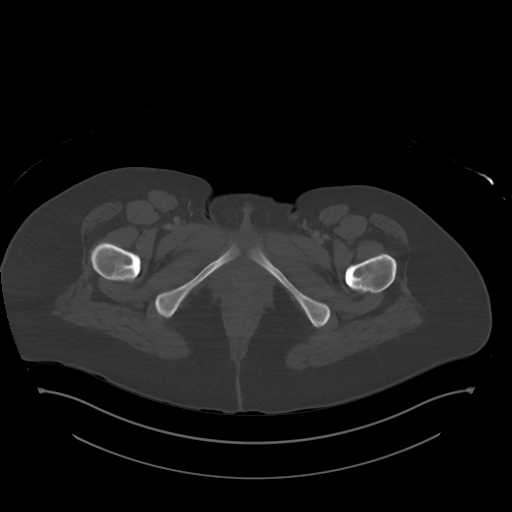
[im 11/93  soft-tissue]
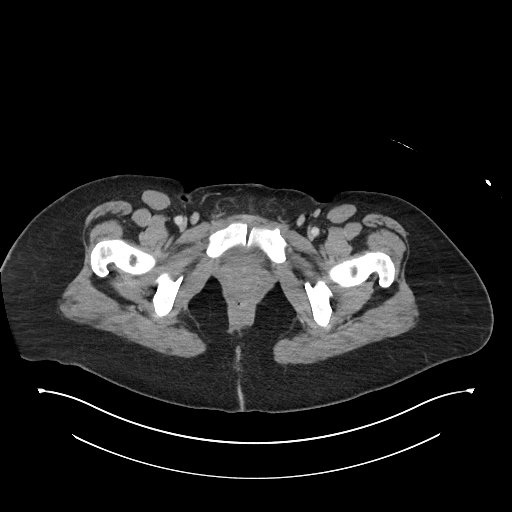
[im 21/93  soft-tissue]
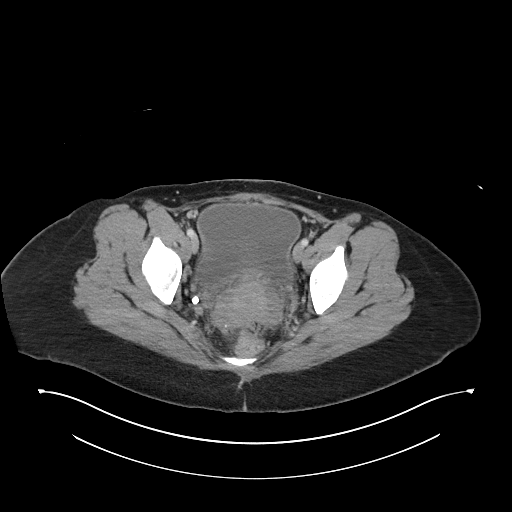
[im 26/93  soft-tissue]
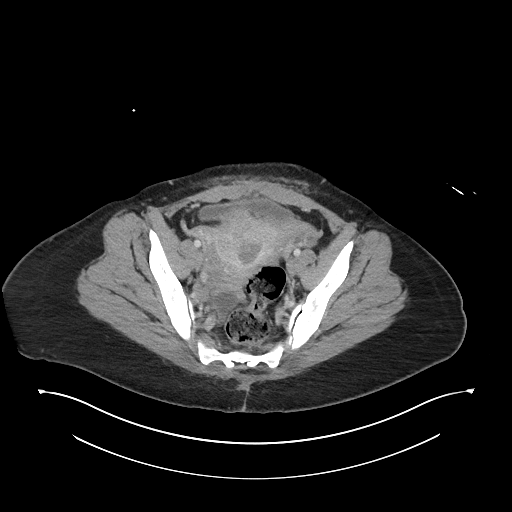
[im 31/93  soft-tissue]
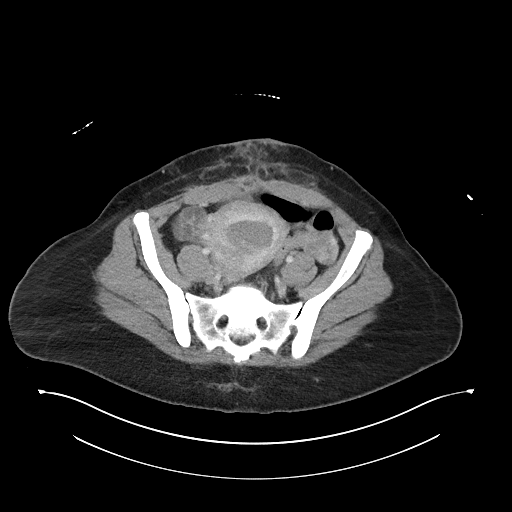
[im 41/93  soft-tissue]
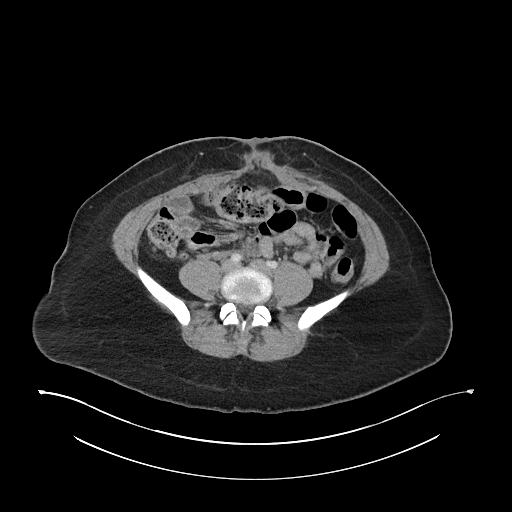
[im 47/93  soft-tissue]
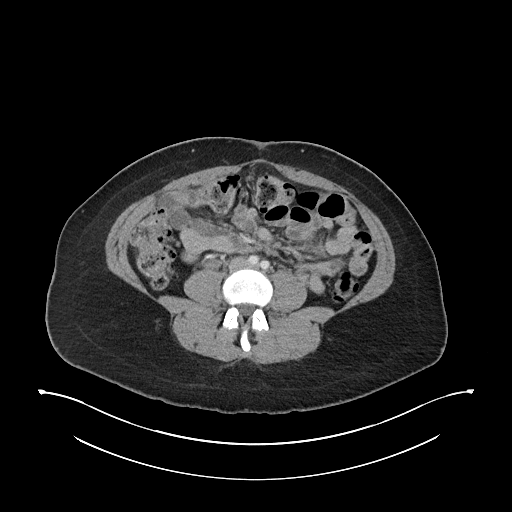
[im 52/93  soft-tissue]
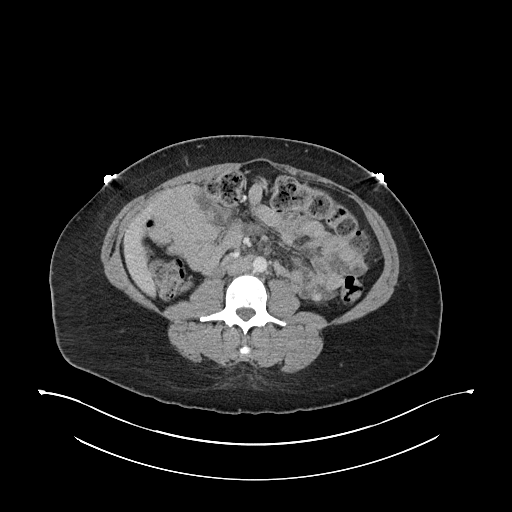
[im 62/93  soft-tissue]
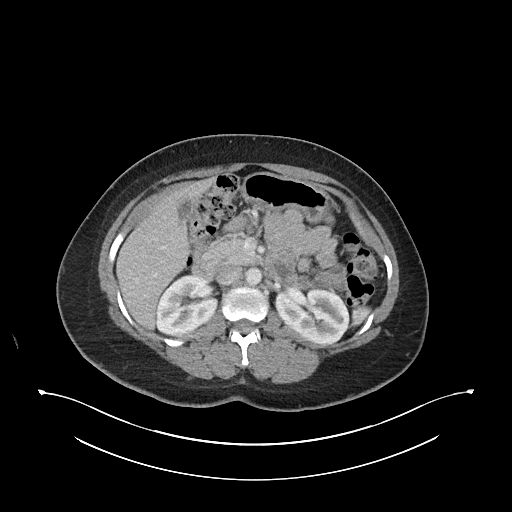
[im 62/93  bone]
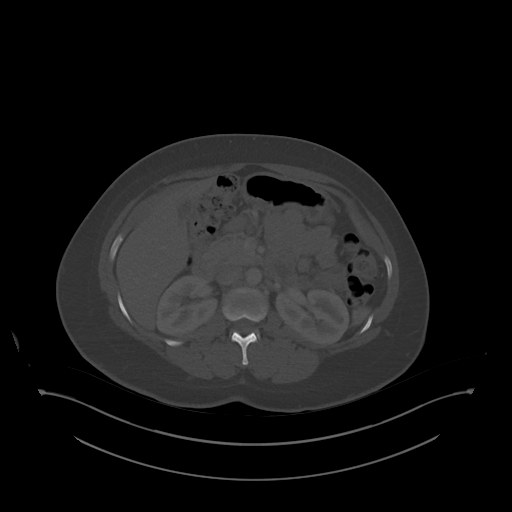
[im 67/93  soft-tissue]
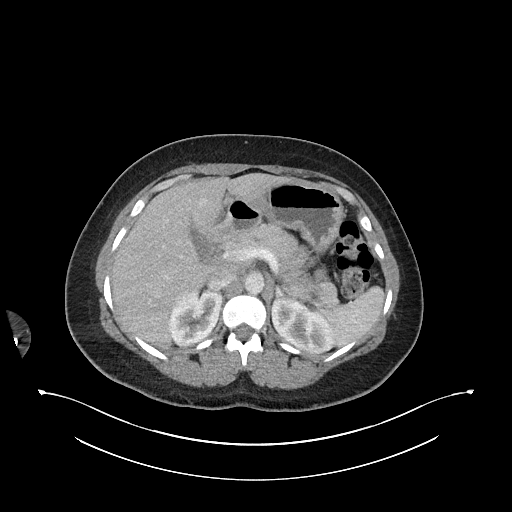
[im 72/93  soft-tissue]
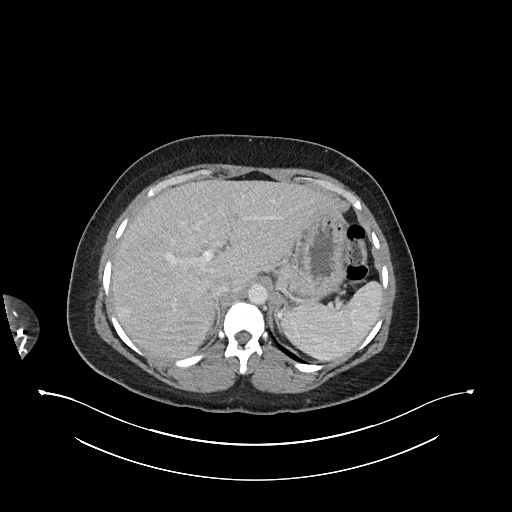
[im 82/93  soft-tissue]
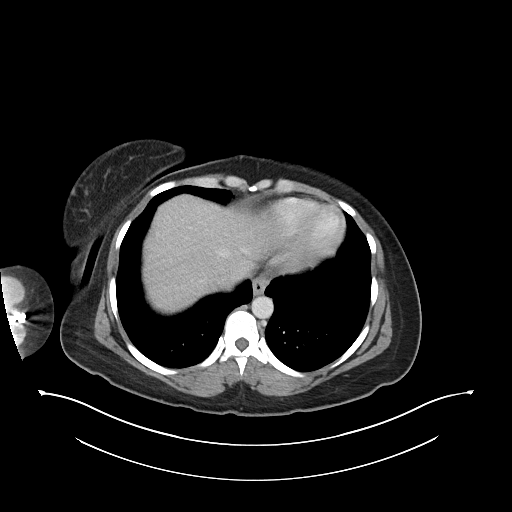
[im 87/93  soft-tissue]
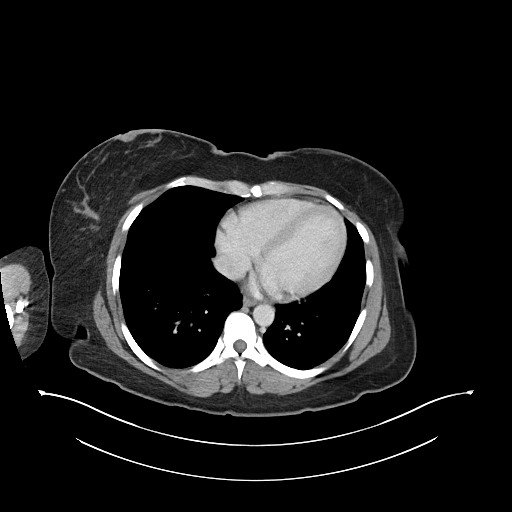

[Series 6: abdomen 3.0 mpr cor · coronal · 0.95mm/px · 3 of 89 slices shown]
[im 30/89  soft-tissue]
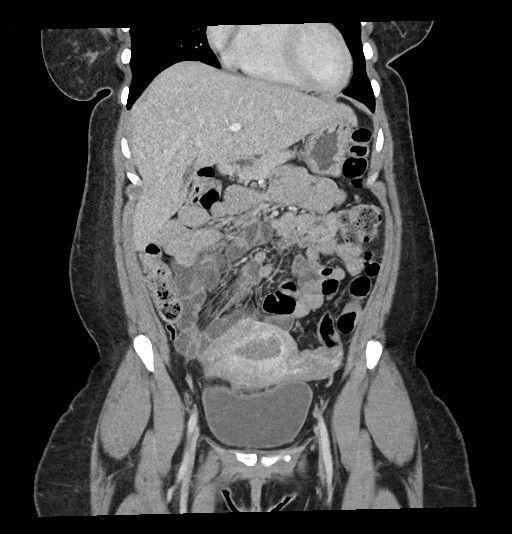
[im 40/89  soft-tissue]
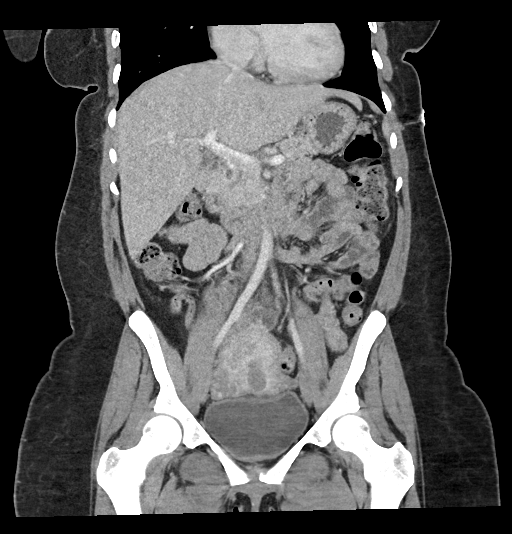
[im 49/89  soft-tissue]
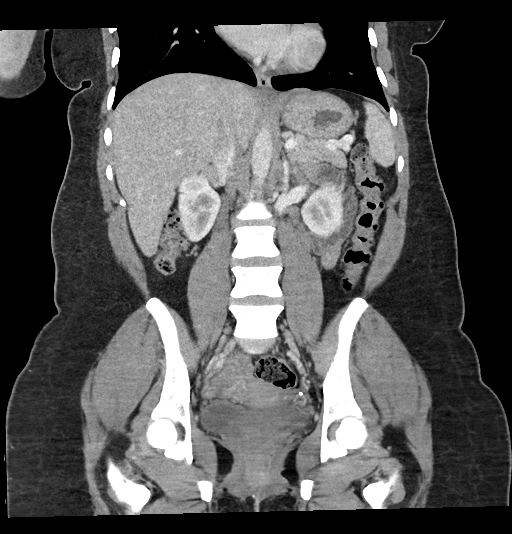

[16 of 46 positions shown; findings below may reference images not displayed]

FINDINGS: Lower chest: No acute abnormality.

Hepatobiliary: No focal liver abnormality is seen. No gallstones,
gallbladder wall thickening, or biliary dilatation.

Pancreas: Unremarkable. No pancreatic ductal dilatation or
surrounding inflammatory changes.

Spleen: Normal in size without focal abnormality.

Adrenals/Urinary Tract: Adrenal glands are unremarkable. Kidneys are
normal, without renal calculi, focal lesion, or hydronephrosis.
Bladder is unremarkable.

Stomach/Bowel: Stomach is within normal limits. Appendix appears
normal. No evidence of bowel wall thickening, distention, or
inflammatory changes.

Vascular/Lymphatic: No significant vascular findings are present. No
enlarged abdominal or pelvic lymph nodes.

Reproductive: The uterus is mildly enlarged and anteverted in
position. Endometrial thickening is suspected. A 1.2 cm diameter
cyst is seen along the anterolateral aspect of the left adnexa.

Other: The ventral hernia is seen along the anterior pelvic wall on
the prior study has been surgically repaired. A moderate amount of
subcutaneous inflammatory fat stranding is seen within this region.
There is no evidence residual hernia, fluid collection or abscess.

Musculoskeletal: No acute or significant osseous findings.
IMPRESSION: 1. Moderate severity cellulitis at the site of the prior ventral
hernia seen along the anterior pelvic wall, without evidence of
hernia, fluid collection or abscess.
2. Small left adnexal cyst, likely ovarian in origin.

## 2020-11-02 MED ORDER — HYDROMORPHONE HCL 1 MG/ML IJ SOLN
1.0000 mg | Freq: Once | INTRAMUSCULAR | Status: AC
  Administered 2020-11-02: 1 mg via INTRAVENOUS
  Filled 2020-11-02: qty 1

## 2020-11-02 MED ORDER — ONDANSETRON HCL 4 MG/2ML IJ SOLN
4.0000 mg | Freq: Once | INTRAMUSCULAR | Status: AC
  Administered 2020-11-02: 4 mg via INTRAVENOUS
  Filled 2020-11-02: qty 2

## 2020-11-02 MED ORDER — MORPHINE SULFATE (PF) 4 MG/ML IV SOLN
4.0000 mg | Freq: Once | INTRAVENOUS | Status: AC
  Administered 2020-11-02: 4 mg via INTRAVENOUS
  Filled 2020-11-02: qty 1

## 2020-11-02 MED ORDER — IOHEXOL 300 MG/ML  SOLN
100.0000 mL | Freq: Once | INTRAMUSCULAR | Status: AC | PRN
  Administered 2020-11-02: 100 mL via INTRAVENOUS

## 2020-11-02 MED ORDER — ONDANSETRON 4 MG PO TBDP: 4.0000 mg | ORAL_TABLET | Freq: Three times a day (TID) | ORAL | 0 refills | Status: DC | PRN

## 2020-11-02 MED ORDER — DOXYCYCLINE HYCLATE 100 MG PO TABS
100.0000 mg | ORAL_TABLET | Freq: Once | ORAL | Status: AC
  Administered 2020-11-02: 100 mg via ORAL
  Filled 2020-11-02: qty 1

## 2020-11-02 MED ORDER — LACTATED RINGERS IV BOLUS
1000.0000 mL | Freq: Once | INTRAVENOUS | Status: AC
  Administered 2020-11-02: 1000 mL via INTRAVENOUS

## 2020-11-02 MED ORDER — DOXYCYCLINE HYCLATE 100 MG PO CAPS
100.0000 mg | ORAL_CAPSULE | Freq: Two times a day (BID) | ORAL | 0 refills | Status: DC
Start: 2020-11-02 — End: 2021-05-05

## 2020-11-02 MED ORDER — LEVETIRACETAM 500 MG PO TABS
500.0000 mg | ORAL_TABLET | Freq: Once | ORAL | Status: AC
  Administered 2020-11-02: 500 mg via ORAL
  Filled 2020-11-02: qty 1

## 2020-11-02 MED ORDER — ACETAMINOPHEN 500 MG PO TABS
1000.0000 mg | ORAL_TABLET | Freq: Once | ORAL | Status: AC
  Administered 2020-11-02: 1000 mg via ORAL
  Filled 2020-11-02: qty 2

## 2020-11-02 MED ORDER — MORPHINE SULFATE (PF) 4 MG/ML IV SOLN
4.0000 mg | Freq: Once | INTRAVENOUS | Status: AC
Start: 2020-11-02 — End: 2020-11-02
  Administered 2020-11-02: 4 mg via INTRAVENOUS
  Filled 2020-11-02: qty 1

## 2020-11-02 NOTE — ED Notes (Signed)
Pt provided with AVS, denies questions or concerns. Pt verbalized understanding. Pt denies wheelchair for D/C. Family coming to pick up pt.
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
## 2020-11-02 NOTE — MAU Note (Signed)
C/s 8/15, hernia repair 8/25. Ever since was dc'd has been throwing up, issues with peeing and with bowel movements(not well formed, like sludge). Talked to [facility], on Friday, was told to come here- ? Infection. Pt says the "knot " has moved to above belly button, ? Enlarged, is super hard and hot to touch. Hard to take deep breaths. Gets a pinching stabbing pain when standing or walking. Low grade temp, 99.9 last night, this morning was 100.6.

## 2020-11-02 NOTE — ED Notes (Signed)
Patient transported to CT 

## 2020-11-02 NOTE — MAU Note (Signed)
Did not take BP medication this morning.

## 2020-11-02 NOTE — ED Provider Notes (Signed)
Emergency Medicine Provider Triage Evaluation Note  Leah Shea , a 35 y.o. female  was evaluated in triage.  Pt complains of only having two Bms since surgery.  Her surgery was 10/08/20.,  She states that the last four days she has had fevers that are more constant will chills and is now having swelling of the bulge in her stomach two days ago.  She reports pelvic pain when she urinates but denies any fellings of UTI.   She reports shortness of breath.    She states that she did not take her Keppra this morning  Review of Systems  Positive: Abdominal pain, fevers, shortness of breath  Physical Exam  BP (!) 153/98 (BP Location: Right Arm)   Pulse 77   Temp 98.1 F (36.7 C) (Oral)   Resp 18   Ht 5' 6.5" (1.689 m)   Wt 89.8 kg   SpO2 99%   Breastfeeding Yes Comment: [facility] told her to stop  BMI 31.48 kg/m  Gen:   Awake, no distress   Resp:  Normal effort  MSK:   Moves extremities without difficulty  Other:  Walks hunched over, tearful, appears to be in pain   Medical Decision Making  Medically screening exam initiated at 1:57 PM.  Appropriate orders placed.  Leah Shea was informed that the remainder of the evaluation will be completed by another provider, this initial triage assessment does not replace that evaluation, and the importance of remaining in the ED until their evaluation is complete.  Her home Keppra as ordered.  Obtain labs, CT scan, urine.  Given her shortness of breath we will also obtain chest x-ray.   Cristina Gong, PA-C 11/02/20 1407    Milagros Loll, MD 11/09/20 [id number]

## 2020-11-02 NOTE — MAU Provider Note (Signed)
Event Date/Time   First Provider Initiated Contact with Patient 11/02/20 1247      S [name] is a 35 y.o. [id number] patient who is about 5 weeks postop from cesarean delivery and about 3.5 weeks out from Ex Lap by general surgery. She presents to MAU today with complaint of abdominal pain. She had called gen surgery last week and they told her to come to the ED. Her pain is in her upper abdomen.  O BP (!) 153/98 (BP Location: Right Arm)   Pulse 77   Temp 98.1 F (36.7 C) (Oral)   Resp 18   SpO2 99%   Breastfeeding Yes Comment: WIC told her to stop Physical Exam Vitals reviewed.  Constitutional:      Appearance: She is well-developed.  Neurological:     Mental Status: She is alert.    A Medical screening exam complete  P As patient 5 weeks postpartum, will send to main ED. I discussed patient with Dr Jeraldine Loots in ED, who accepted patient.  Levie Heritage, DO 11/02/2020 12:53 PM

## 2020-11-02 NOTE — ED Triage Notes (Addendum)
Pt here for abd pain, dysuria, N/V/D, and fevers x4 days. Pt had c-section and had hernia repair 10 days later in August. Surgeon advised her to go to MAU but MAU sent her here. Pt reports only having 2 BMs since surgery, has had abd cramping w/ Bms.

## 2020-11-02 NOTE — ED Notes (Signed)
ED Provider at bedside. 

## 2020-11-05 ENCOUNTER — Encounter: Payer: Self-pay | Admitting: Pharmacist

## 2020-11-09 ENCOUNTER — Other Ambulatory Visit: Payer: Self-pay | Admitting: Obstetrics & Gynecology

## 2020-11-12 ENCOUNTER — Ambulatory Visit (INDEPENDENT_AMBULATORY_CARE_PROVIDER_SITE_OTHER): Payer: Medicaid Other | Admitting: Obstetrics and Gynecology

## 2020-11-12 ENCOUNTER — Other Ambulatory Visit: Payer: Self-pay

## 2020-11-12 ENCOUNTER — Encounter: Payer: Self-pay | Admitting: Obstetrics and Gynecology

## 2020-11-12 DIAGNOSIS — E119 Type 2 diabetes mellitus without complications: Secondary | ICD-10-CM

## 2020-11-12 DIAGNOSIS — R112 Nausea with vomiting, unspecified: Secondary | ICD-10-CM | POA: Diagnosis not present

## 2020-11-12 MED ORDER — ACETAMINOPHEN 500 MG PO TABS: 500.0000 mg | ORAL_TABLET | Freq: Four times a day (QID) | ORAL | 0 refills | Status: DC | PRN

## 2020-11-12 MED ORDER — METOCLOPRAMIDE HCL 10 MG PO TABS: ORAL_TABLET | ORAL | 0 refills | Status: DC

## 2020-11-12 NOTE — Progress Notes (Signed)
Post Partum Visit Note  Leah Shea is a 35 y.o. [phone] female who presents for a postpartum visit. She is 7 weeks postpartum following a repeat cesarean section.  I have fully reviewed the prenatal and intrapartum course. The delivery was at 34.5 gestational weeks.  Anesthesia: epidural. Postpartum course has been complicated by need for abdominal wall repair and a wound infection. Baby is doing well. Baby is feeding by bottle - Enfacare. Bleeding thick, heavy lochia. Bowel function is abnormal: BM is slushy and difficult . Bladder function is abnormal: abdominal pain 6-7/10 . Patient is not sexually active. Contraception method is none. Postpartum depression screening: positive=25. Patient with previous diagnosis of anxiety and depression. Medication dosage increased last week. Patient reports receiving ample assistance from her FOB and mother   Upstream - 11/12/20 1024       Pregnancy Intention Screening   Does the patient want to become pregnant in the next year? No    Does the patient's partner want to become pregnant in the next year? Yes    Would the patient like to discuss contraceptive options today? Yes            The pregnancy intention screening data noted above was reviewed. Potential methods of contraception were discussed. The patient elected to proceed with No data recorded.   Edinburgh Postnatal Depression Scale - 11/12/20 1025       Edinburgh Postnatal Depression Scale:  In the Past 7 Days   I have been able to laugh and see the funny side of things. 2    I have looked forward with enjoyment to things. 3    I have blamed myself unnecessarily when things went wrong. 3    I have been anxious or worried for no good reason. 3    I have felt scared or panicky for no good reason. 3    Things have been getting on top of me. 2    I have been so unhappy that I have had difficulty sleeping. 3    I have felt sad or miserable. 3    I have been so unhappy that I  have been crying. 3    The thought of harming myself has occurred to me. 0    Edinburgh Postnatal Depression Scale Total 25             Health Maintenance Due  Topic Date Due   COVID-19 Vaccine (1) Never done   URINE MICROALBUMIN  Never done   INFLUENZA VACCINE  09/14/2020       Review of Systems Pertinent items noted in HPI and remainder of comprehensive ROS otherwise negative.  Objective:  BP (!) 133/91 (BP Location: Right Arm, Cuff Size: Large)   Pulse 99   Ht 5' 6.5" (1.689 m)   Wt 203 lb (92.1 kg)   Breastfeeding No   BMI 32.27 kg/m    General:  alert, cooperative, and no distress   Breasts:  normal  Lungs: clear to auscultation bilaterally  Heart:  regular rate and rhythm  Abdomen: soft, non-tender; bowel sounds normal; no masses,  no organomegaly   Wound well approximated incision, no erythema or induration. Mild tenderness to palpation above the incision  GU exam:  not indicated       Assessment:     Normal postpartum exam with post partum depression.   Plan:   Essential components of care per ACOG recommendations:  1.  Mood and well being: Patient with positive  depression screening today. Reviewed local resources for support. Patient is scheduled to meet with behavioral health therapist in 2 weeks following dosage adjustment. - Patient tobacco use? No.   - hx of drug use? No.    2. Infant care and feeding:  -Patient currently breastmilk feeding? No. Breastfeeding was discontinued during her hospitalization and since her return home. Patient is interested in restarting. Discussed using an electric breast pump rather than manual expression. Rx reglan provided. Discussed increasing fluid intake and consumption of oatmeal and lactation cookies. -Social determinants of health (SDOH) reviewed in EPIC. No concerns  3. Sexuality, contraception and birth spacing - Patient does not want a pregnancy in the next year.  Desired family size is 5 children.  -  Reviewed forms of contraception in tiered fashion. Patient desired abstinence today.   - Discussed birth spacing of 18 months  4. Sleep and fatigue -Encouraged family/partner/community support of 4 hrs of uninterrupted sleep to help with mood and fatigue  5. Physical Recovery  - Discussed patients delivery and complications.  - Patient had a C-section which required abdominal wall repair and recent diagnosis of wound cellulitis on 9/19. Patient is still completing antibiotics and is scheduled to follow up with surgery clinic tomorrow. Patient expressed understanding - Patient has urinary incontinence? No. - Patient is safe to resume physical and sexual activity pending surgery clinic appointment tomorrow  6.  Health Maintenance - HM due items addressed Yes - Last pap smear  Diagnosis  Date Value Ref Range Status  04/14/2020   Final   - Negative for intraepithelial lesion or malignancy (NILM)   Pap smear not done at today's visit.  -Breast Cancer screening indicated? No.   7. Chronic Disease/Pregnancy Condition follow up: Hypertension and Gestational Diabetes. Patient to continue current antihypertensive regimen. Patient reports CBGs persistently in the 200 range. Will have patient follow up PCP as she does not want to continue taking metformin. Patient with persistent nausea and emesis which was present during post op period. Will refer patient to GI  - PCP follow up   [facility] for [employer], [facility]

## 2020-11-18 ENCOUNTER — Encounter: Payer: Self-pay | Admitting: Gastroenterology

## 2020-11-20 ENCOUNTER — Encounter: Payer: Self-pay | Admitting: Radiology

## 2020-11-25 ENCOUNTER — Other Ambulatory Visit: Payer: Self-pay | Admitting: Surgery

## 2020-11-25 DIAGNOSIS — Z9889 Other specified postprocedural states: Secondary | ICD-10-CM

## 2020-11-25 DIAGNOSIS — R1030 Lower abdominal pain, unspecified: Secondary | ICD-10-CM

## 2020-11-27 ENCOUNTER — Encounter: Payer: No Typology Code available for payment source | Admitting: Obstetrics

## 2020-12-03 ENCOUNTER — Ambulatory Visit: Payer: No Typology Code available for payment source | Admitting: Gastroenterology

## 2020-12-07 ENCOUNTER — Encounter: Payer: Self-pay | Admitting: *Deleted

## 2020-12-10 ENCOUNTER — Ambulatory Visit
Admission: RE | Admit: 2020-12-10 | Discharge: 2020-12-10 | Disposition: A | Discharge status: Home / Self Care | Payer: No Typology Code available for payment source | Source: Ambulatory Visit | Attending: Surgery | Admitting: Surgery

## 2020-12-10 DIAGNOSIS — Z9889 Other specified postprocedural states: Secondary | ICD-10-CM

## 2020-12-10 DIAGNOSIS — R1030 Lower abdominal pain, unspecified: Secondary | ICD-10-CM

## 2020-12-10 IMAGING — CT CT ABD-PELV W/ CM
1 of 2 series · 14 of 32 positions shown, 19 images · IV contrast (APPLIED)
Comparison: CT abdomen and pelvis [DATE]

CLINICAL DATA: Abdominal pain.  Recent hernia repair.

EXAM:
CT ABDOMEN AND PELVIS WITH CONTRAST
TECHNIQUE: Multidetector CT imaging of the abdomen and pelvis was performed
using the standard protocol following bolus administration of
intravenous contrast.
CONTRAST:  100mL [ND] IOPAMIDOL ([ND]) INJECTION 61%

[Series 2: abd/pelvis w/cm · axial · 0.82mm/px · z∈[-438,-43]mm · 14 of 91 slices shown, 19 images]
[im 6/91  soft-tissue]
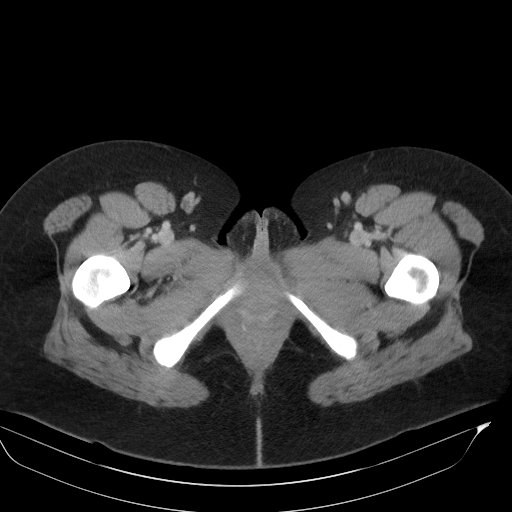
[im 6/91  bone]
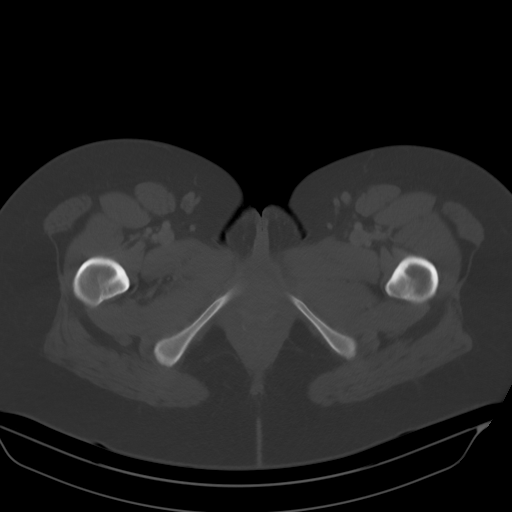
[im 11/91  soft-tissue]
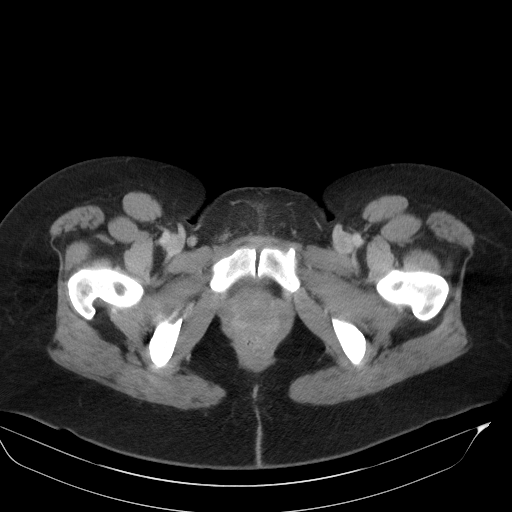
[im 22/91  soft-tissue]
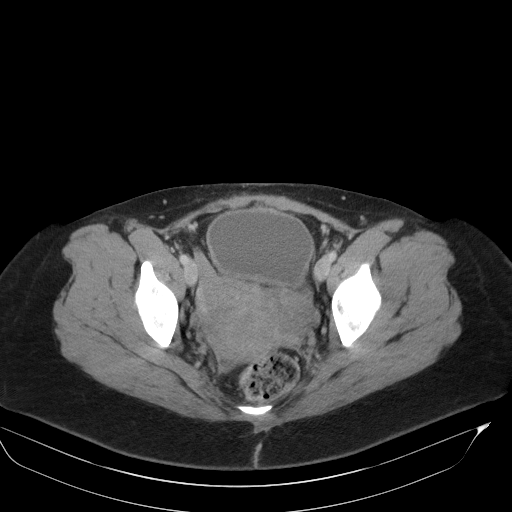
[im 27/91  soft-tissue]
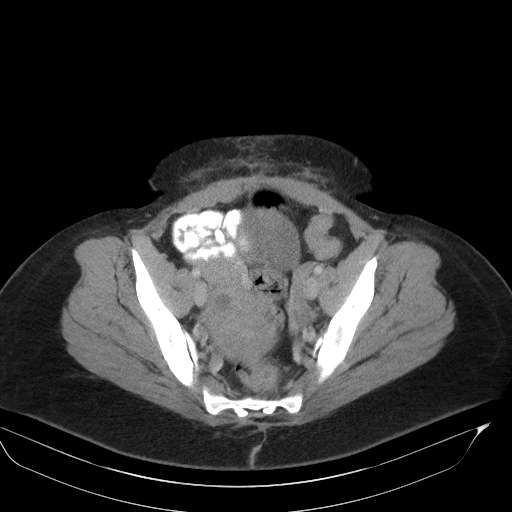
[im 32/91  soft-tissue]
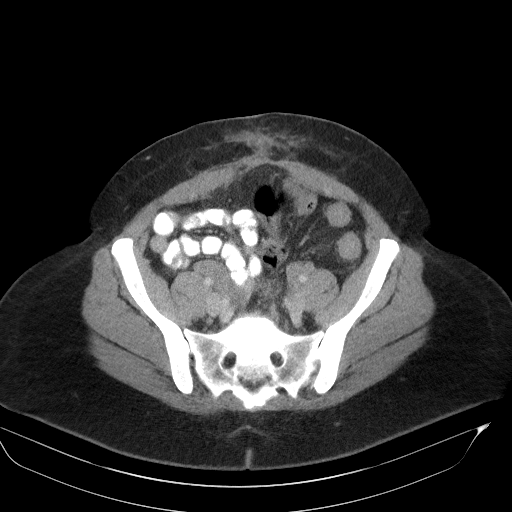
[im 38/91  soft-tissue]
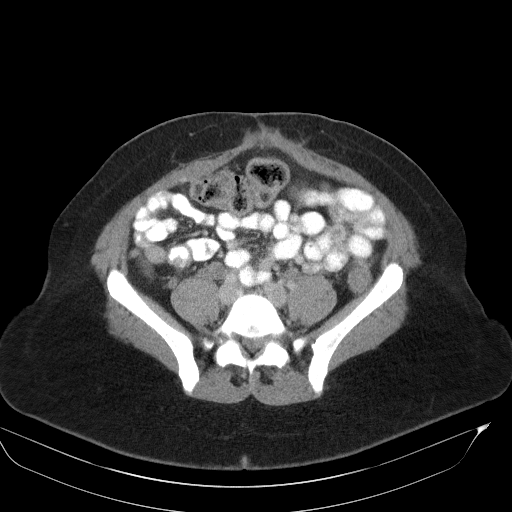
[im 48/91  soft-tissue]
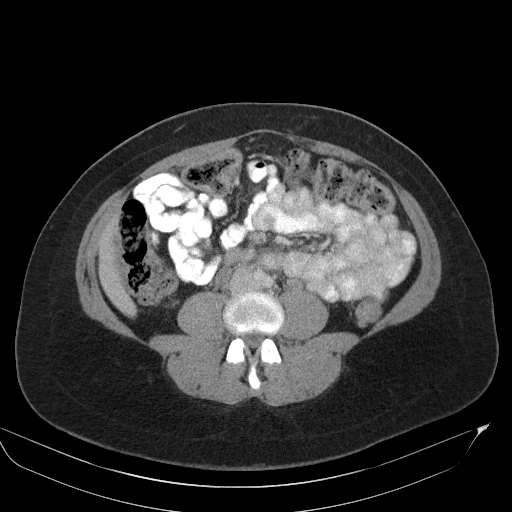
[im 53/91  soft-tissue]
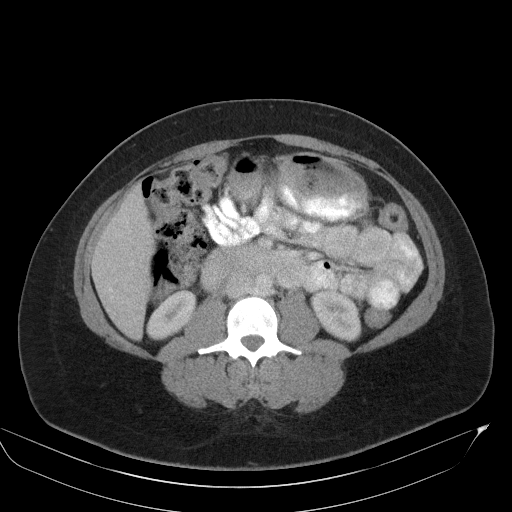
[im 59/91  soft-tissue]
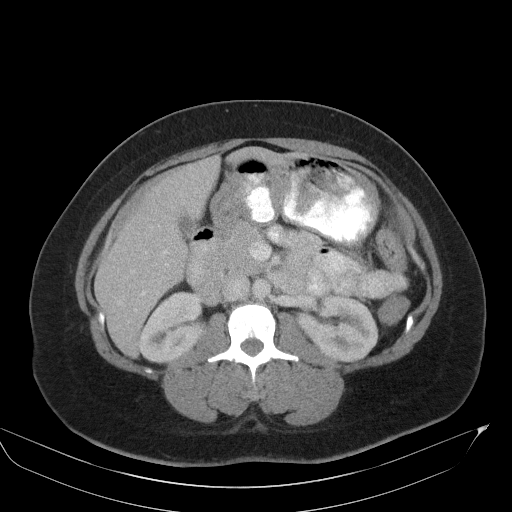
[im 59/91  bone]
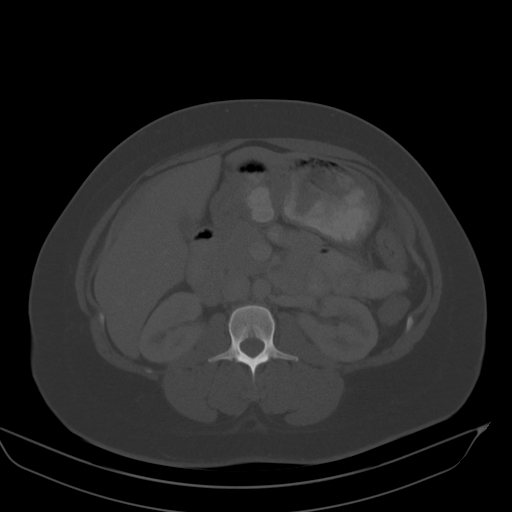
[im 64/91  soft-tissue]
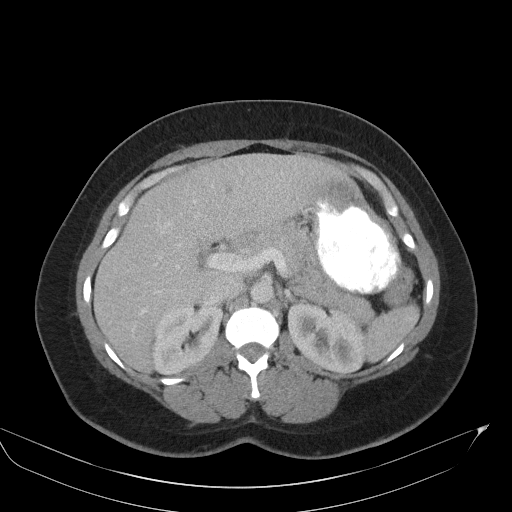
[im 69/91  soft-tissue]
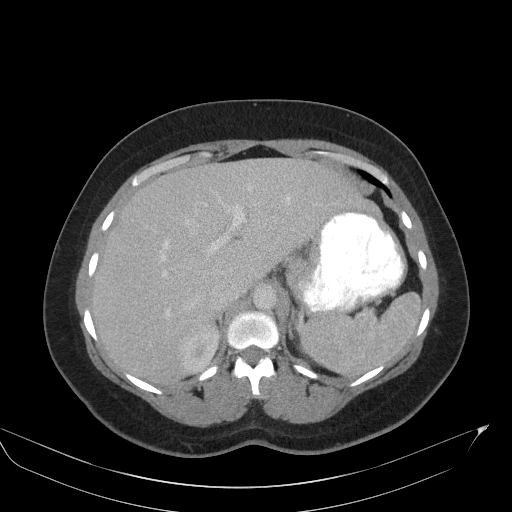
[im 69/91  lung]
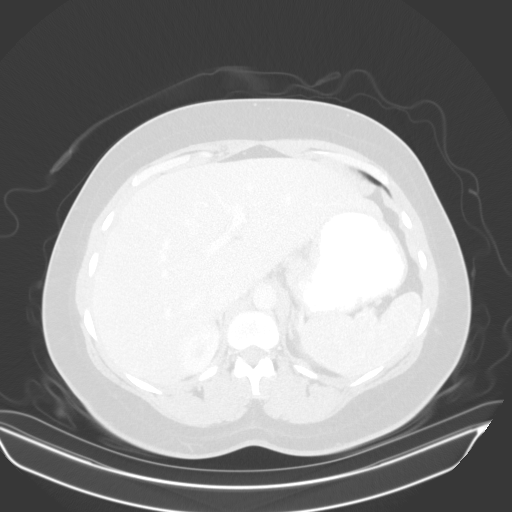
[im 75/91  lung]
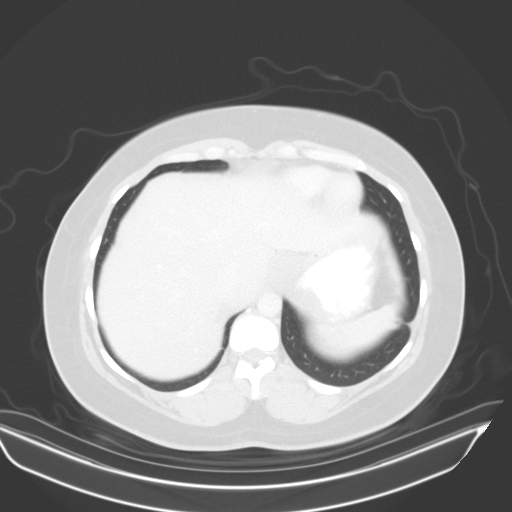
[im 80/91  soft-tissue]
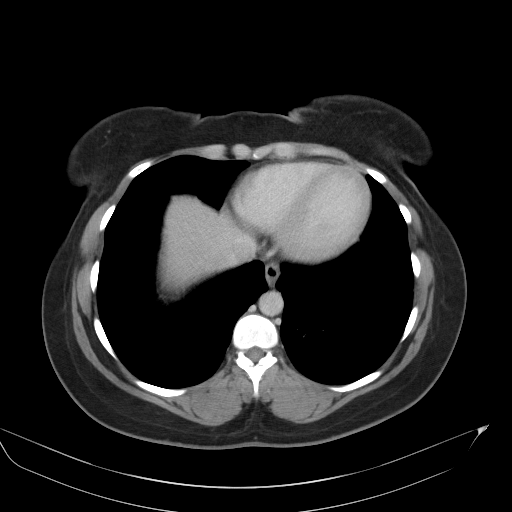
[im 80/91  lung]
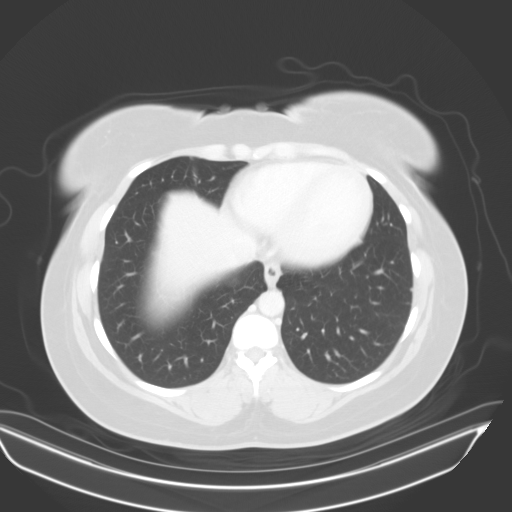
[im 85/91  soft-tissue]
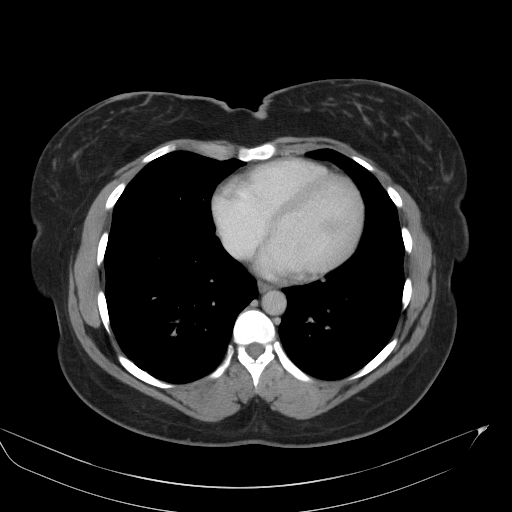
[im 85/91  lung]
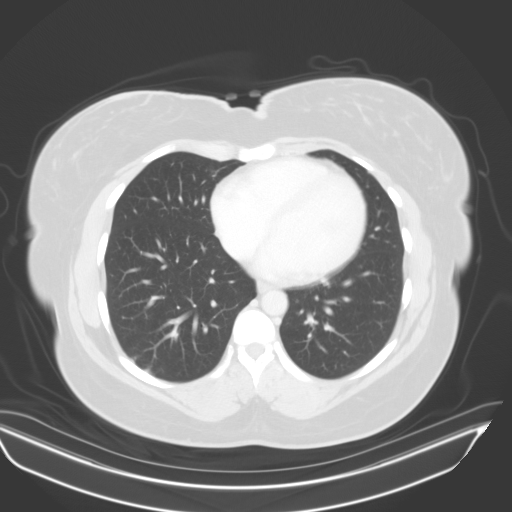

[14 of 32 positions shown; findings below may reference images not displayed]

FINDINGS: Lower chest: No acute abnormality.

Hepatobiliary: No focal liver abnormality is seen. No gallstones,
gallbladder wall thickening, or biliary dilatation.

Pancreas: Unremarkable. No pancreatic ductal dilatation or
surrounding inflammatory changes.

Spleen: Normal in size without focal abnormality.

Adrenals/Urinary Tract: Adrenal glands are unremarkable. Kidneys are
normal, without renal calculi, focal lesion, or hydronephrosis.
Bladder is unremarkable.

Stomach/Bowel: Stomach is within normal limits. Appendix appears
normal. No evidence of bowel wall thickening, distention, or
inflammatory changes.

Vascular/Lymphatic: No significant vascular findings are present. No
enlarged abdominal or pelvic lymph nodes.

Reproductive: Uterus is prominent and diffusely heterogeneous
similar to previous study. 2.3 cm hypodense follicle/small cyst of
the right ovary.

Other: Postsurgical changes of the midline anterior abdominal wall
with associated subcutaneous fat stranding densities which appear
similar to slightly decreased since previous study. No focal fluid
collection or new hernia identified. No ascites.

Musculoskeletal: No acute or significant osseous findings.
IMPRESSION: 1. Postsurgical changes of the ventral abdominal wall with no new
associated detrimental process visualized.
2. Prominent heterogeneous uterus.

## 2020-12-10 MED ORDER — IOPAMIDOL (ISOVUE-300) INJECTION 61%
100.0000 mL | Freq: Once | INTRAVENOUS | Status: AC | PRN
  Administered 2020-12-10: 100 mL via INTRAVENOUS

## 2021-03-03 ENCOUNTER — Other Ambulatory Visit: Payer: Self-pay | Admitting: Obstetrics and Gynecology

## 2021-03-08 ENCOUNTER — Other Ambulatory Visit: Payer: Self-pay

## 2021-03-08 DIAGNOSIS — Z789 Other specified health status: Secondary | ICD-10-CM

## 2021-03-08 MED ORDER — FOLIC ACID 1 MG PO TABS: 1.0000 mg | ORAL_TABLET | Freq: Every day | ORAL | 2 refills | Status: DC

## 2021-04-16 ENCOUNTER — Encounter: Payer: Self-pay | Admitting: Gastroenterology

## 2021-05-05 ENCOUNTER — Encounter: Payer: Self-pay | Admitting: Gastroenterology

## 2021-05-05 ENCOUNTER — Ambulatory Visit (INDEPENDENT_AMBULATORY_CARE_PROVIDER_SITE_OTHER): Payer: Medicaid Other | Admitting: Gastroenterology

## 2021-05-05 ENCOUNTER — Telehealth: Payer: Self-pay

## 2021-05-05 VITALS — BP 132/90 | HR 89 | Ht 66.0 in | Wt 223.0 lb

## 2021-05-05 DIAGNOSIS — R112 Nausea with vomiting, unspecified: Secondary | ICD-10-CM | POA: Diagnosis not present

## 2021-05-05 DIAGNOSIS — R1013 Epigastric pain: Secondary | ICD-10-CM | POA: Diagnosis not present

## 2021-05-05 DIAGNOSIS — K582 Mixed irritable bowel syndrome: Secondary | ICD-10-CM | POA: Diagnosis not present

## 2021-05-05 MED ORDER — OMEPRAZOLE 20 MG PO CPDR: 20.0000 mg | DELAYED_RELEASE_CAPSULE | Freq: Every day | ORAL | 1 refills | Status: AC

## 2021-05-05 MED ORDER — HYOSCYAMINE SULFATE 0.125 MG PO TBDP: 0.1250 mg | ORAL_TABLET | ORAL | 1 refills | Status: DC | PRN

## 2021-05-05 NOTE — Patient Instructions (Signed)
If you are age 36 or older, your body mass index should be between 23-30. Your Body mass index is 35.99 kg/m?Marland Kitchen If this is out of the aforementioned range listed, please consider follow up with your Primary Care Provider. ? ?If you are age 52 or younger, your body mass index should be between 19-25. Your Body mass index is 35.99 kg/m?Marland Kitchen If this is out of the aformentioned range listed, please consider follow up with your Primary Care Provider.  ? ?Your provider has requested that you go to the basement level for lab work before leaving today. Press "B" on the elevator. The lab is located at the first door on the left as you exit the elevator.  ? ?We have sent the following medications to your pharmacy for you to pick up at your convenience: Omeprazole 20 mg daily. Hyoscyamine 0.125 mcg as needed for abdominal pain. ? ?Start Metamucil everyday. ?Stop Miralax. ? ? ?The [facility] providers would like to encourage you to use [facility] to communicate with providers for non-urgent requests or questions.  Due to long hold times on the telephone, sending your provider a message by [facility] may be a faster and more efficient way to get a response.  Please allow 48 business hours for a response.  Please remember that this is for non-urgent requests.  ? ?It was a pleasure to see you today! ? ?Thank you for trusting me with your gastrointestinal care!   ? ?Scott E.Tomasa Rand, MD  ? ? ? ?

## 2021-05-05 NOTE — Progress Notes (Signed)
HPI : Leah Shea is a very pleasant 35 year old female with a reported history of IBS and recurrent diverticulitis who is [facility] to [place] [facility] for ongoing symptoms of multiple chronic GI symptoms.  The patient states that she has had problems with upper abdominal pain, nausea and vomiting ever since she gave birth to her youngest son 7 months ago.  She has symptoms on a daily basis consisting of nausea and abdominal pain.  She vomits several days a week.  Usually her vomiting is experienced upon awakening and sometimes will be yellow or green bilious fluid, all the time she vomits the food she ate the evening before.  Her nausea is persistent throughout the day.  Her pain and nausea are worse with eating and drinking.  She frequently is awaken from sleep with symptoms of pain or nausea.  She also reports abdominal pain with urinating.  She has a different abdominal pain which is constant in the lower abdomen and reports that her abdomen feels hot to touch sometimes.  She has a history of a dehisced C-section incision/hernia that was repaired emergently last year. She also reports frequent black and tarry stools over the 17-month.  Her bowel movements vary from watery stools to these black tarry stools which are difficult to flush.  Some week she will have 1 or 2 stools the entire week and then will have 2-4 stools per day for the next few days.  She denies symptoms of lightheadedness, weakness or presyncope. The patient reports a history of having a colonoscopy but does not think she had an upper endoscopy.  She does not take NSAIDs.  She takes Tylenol frequently for pain and sometimes takes oxycodone, usually less than once a week. She has not lost any weight during this time, rather her weight has increased.  She was diagnosed with gestational diabetes during her pregnancy last year and started on metformin, which she stopped in October 2022 because she thought it was making her GI symptoms  worse.  She reports having 2 colonoscopies at the [place] in [place].  She thinks she may have had polyps but is not sure.  She reports being diagnosed with diverticulitis 3 times treated with antibiotics.  The last time she saw her gastroenterologist was in 2021.  She tried to follow-up with them, but they did not have availability so she was deferred to the civilian network. She notes she has previously been treated with Protonix and thinks Bentyl as well.  She is not currently taking any PPI of these she has a prescription for Protonix.  Upon further discussion, she has been taking MiraLAX every day, which she thought was a fiber supplement.  This was prescribed to her through the [place].  She has a history of marijuana use, but states that the last time she used it was in December.   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   Transferred from DOD/[facility] problem list   PTSD (post-traumatic stress disorder)    Shortness of breath    with exertion or panic attack   Sickle cell trait (HCC)    Sleep apnea  awaiting a sleep study to be done at [facility]     Past Surgical History:  Procedure Laterality Date   BREAST SURGERY Bilateral    reductions   CESAREAN SECTION  2010, 0865,7846   x 3   CESAREAN SECTION N/A 09/28/2020   Procedure: CESAREAN SECTION;  Surgeon: Venora Maples, MD;  Location: [facility] LD ORS;  Service: Obstetrics;  Laterality: N/A;   EYE SURGERY     x 10 as a child   HERNIA REPAIR  10/08/2020   LAPAROTOMY N/A 10/08/2020   Procedure: EXPLORATORY LAPAROTOMY WITH CLOSURE ABDOMINAL WALL DEFECT POSSIBLE BOWEL RESECTION;  Surgeon: Harriette Bouillon, MD;  Location: [facility] OR;  Service:  General;  Laterality: N/A;   PILONIDAL CYST EXCISION  2020   TONSILLECTOMY N/A 10/07/2013   Procedure: TONSILLECTOMY;  Surgeon: Christia Reading, MD;  Location: [facility] OR;  Service: ENT;  Laterality: N/A;   TUMOR REMOVAL     tumor removed from back   Family History  Problem Relation Age of Onset   Breast cancer Mother    Breast cancer Maternal Aunt 36       metastatic   Cancer Maternal Uncle        unk type   Breast cancer Maternal Grandmother    Prostate cancer Maternal Grandfather        metastatic   Cancer Maternal Aunt        either breast or ovarian    Social History   Tobacco Use   Smoking status: Former    Packs/day: 0.25    Years: 1.00    Pack years: 0.25    Types: Cigarettes    Quit date: 07/02/2013    Years since quitting: 7.8   Smokeless tobacco: Never  Vaping Use   Vaping Use: Never used  Substance Use Topics   Alcohol use: Not Currently   Drug use: Not Currently    Types: Marijuana    Comment: last used January 3   Current Outpatient Medications  Medication Sig Dispense Refill   acetaminophen (TYLENOL) 500 MG tablet TAKE TWO TABLETS BY MOUTH EVERY 6 HOURS AS NEEDED     acetaminophen (TYLENOL) 500 MG tablet Take 1 tablet (500 mg total) by mouth every 6 (six) hours as needed. 30 tablet 0   diphenhydrAMINE (SOMINEX) 25 MG tablet Take 1 tablet by mouth at bedtime.     LORazepam (ATIVAN) 0.5 MG tablet Take 1 tablet (0.5 mg total) by mouth every 6 (six) hours as needed for anxiety or sleep. 30 tablet 0   metFORMIN (GLUCOPHAGE) 500 MG tablet Take 1 tablet (500 mg total) by mouth 2 (two) times daily with a meal. 60 tablet 0   metoCLOPramide (REGLAN) 10 MG tablet 10 mg three times daily for 30 days, 10 mg twice daily for 4 days and 10 mg daily for 4 days 102 tablet 0   NIFEdipine (ADALAT CC) 60 MG 24 hr tablet Take 1 tablet (60 mg total) by mouth daily. 30 tablet 0   ondansetron (ZOFRAN ODT) 4 MG disintegrating tablet Take 1 tablet (4 mg total) by mouth every 8 (eight)  hours as needed for nausea or vomiting. 20 tablet 0   oxyCODONE 10 MG TABS Take 1-1.5 tablets (10-15 mg total) by mouth every 4 (four) hours as needed for severe pain or moderate pain (10mg  for moderate pain,15mg  for severe pain). (Patient not taking: Reported on 11/12/2020) 30 tablet 0   pantoprazole (PROTONIX) 40 MG tablet Take 1 tablet (40 mg total) by mouth  daily. 30 tablet 6   No current facility-administered medications for this visit.  Patient states that she is only taking the phenergan, zofran and Tylenol. Takes oxy maybe once a week Allergies  Allergen Reactions   Mushroom Extract Complex Anaphylaxis and Hives   Other Anaphylaxis and Hives    Mushrooms   Nsaids Hives, Swelling and Other (See Comments)    "Body burns" Tolerated celecoxib 09/2020    Prenatal Vitamins    Reglan [Metoclopramide] Anxiety   Sulfa Antibiotics Other (See Comments)     Review of Systems: All systems reviewed and negative except where noted in HPI.    No results found.  Physical Exam: Ht 5\' 6"  (1.676 m)   Wt 223 lb (101.2 kg)   BMI 35.99 kg/m  Constitutional: Pleasant,well-developed, African-American female in no acute distress.  Accompanied by her infant son HEENT: Normocephalic and atraumatic. Conjunctivae are normal. No scleral icterus. Neck supple.  Cardiovascular: Normal rate, regular rhythm.  Pulmonary/chest: Effort normal and breath sounds normal. No wheezing, rales or rhonchi. Abdominal: Soft, nondistended, multifocal tenderness to very light palpation throughout the abdomen.  No rigidity or guarding.  Well-healed laparoscopic port scars.  Bowel sounds active throughout. There are no masses palpable. No hepatomegaly. Extremities: no edema Neurological: Alert and oriented to person place and time. Skin: Skin is warm and dry. No rashes noted. Psychiatric: Normal mood and affect. Behavior is normal.  CBC    Component Value Date/Time   WBC 6.4 11/02/2020 1404   RBC 4.41 11/02/2020  1404   HGB 12.8 11/02/2020 1404   HGB 10.8 (L) 08/25/2020 1017   HCT 38.8 11/02/2020 1404   HCT 32.8 (L) 08/25/2020 1017   PLT 272 11/02/2020 1404   PLT 182 08/25/2020 1017   MCV 88.0 11/02/2020 1404   MCV 91 08/25/2020 1017   MCH 29.0 11/02/2020 1404   MCHC 33.0 11/02/2020 1404   RDW 12.5 11/02/2020 1404   RDW 12.7 08/25/2020 1017   LYMPHSABS 2.7 11/02/2020 1404   LYMPHSABS 2.3 04/14/2020 1100   MONOABS 0.4 11/02/2020 1404   EOSABS 0.2 11/02/2020 1404   EOSABS 0.1 04/14/2020 1100   BASOSABS 0.1 11/02/2020 1404   BASOSABS 0.1 04/14/2020 1100    CMP     Component Value Date/Time   NA 139 11/02/2020 1404   K 3.9 11/02/2020 1404   CL 110 11/02/2020 1404   CO2 20 (L) 11/02/2020 1404   GLUCOSE 103 (H) 11/02/2020 1404   BUN 12 11/02/2020 1404   CREATININE 0.90 11/02/2020 1404   CALCIUM 9.5 11/02/2020 1404   PROT 6.9 11/02/2020 1404   ALBUMIN 3.6 11/02/2020 1404   AST 13 (L) 11/02/2020 1404   ALT 14 11/02/2020 1404   ALKPHOS 79 11/02/2020 1404   BILITOT 0.4 11/02/2020 1404   GFRNONAA >60 11/02/2020 1404   GFRAA >60 02/08/2019 1418     ASSESSMENT AND PLAN: 36 year old female with reported history of IBS, recurrent diverticulitis with 7 months of epigastric pain, nausea and vomiting with paradoxical weight gain (20 pounds since September) and reported black tarry stool without symptoms of anemia or acute blood loss.  Although I am more suspicious of a functional GI disorder causing her upper GI symptoms, I do believe an upper endoscopy is warranted to exclude peptic ulcer disease and gastric outlet obstruction.  I do not believe that her black tarry stools are representative of true melena given her absence of other symptoms and her chronic nature.  I would like to check a CBC  and CMP for reassurance. Recommended she start taking omeprazole 20 mg once a day.  Suggest that she try hyoscyamine as needed for her abdominal pain and start taking Metamucil to improve her stool  consistency and regularity. I suggested that she stop taking the MiraLAX given her frequent and persistent loose stools. We will [facility] to clarify her previous endoscopic history and diagnoses.  Epigastric pain/nausea/vomiting -EGD -Omeprazole 20 mg PO daily - CBC, CMP  IBS - Hyoscyamine PRN -Daily Metamucil - Stop MiraLAX  Obtain records from [place]  Follow up in 6 weeks  The details, risks (including bleeding, perforation, infection, missed lesions, medication reactions and possible hospitalization or surgery if complications occur), benefits, and alternatives to EGD with possible biopsy and possible dilation were discussed with the patient and she consents to proceed.   Neya Creegan E. Tomasa Rand, MD [facility]   CC:  [facility]

## 2021-05-05 NOTE — Telephone Encounter (Signed)
Called patient and let her know that we are working on authorization for her Endoscopy on Friday and would let her know if it needed to be rescheduled. Patient stated understanding and had no questions at the end of call. ?

## 2021-05-07 ENCOUNTER — Ambulatory Visit (AMBULATORY_SURGERY_CENTER): Payer: Medicaid Other | Admitting: Gastroenterology

## 2021-05-07 ENCOUNTER — Encounter: Payer: Self-pay | Admitting: Gastroenterology

## 2021-05-07 VITALS — BP 140/90 | HR 91 | Temp 98.0°F | Resp 12 | Ht 66.0 in | Wt 223.0 lb

## 2021-05-07 DIAGNOSIS — K319 Disease of stomach and duodenum, unspecified: Secondary | ICD-10-CM | POA: Diagnosis not present

## 2021-05-07 DIAGNOSIS — R1013 Epigastric pain: Secondary | ICD-10-CM

## 2021-05-07 DIAGNOSIS — R112 Nausea with vomiting, unspecified: Secondary | ICD-10-CM

## 2021-05-07 DIAGNOSIS — K449 Diaphragmatic hernia without obstruction or gangrene: Secondary | ICD-10-CM | POA: Diagnosis not present

## 2021-05-07 MED ORDER — SODIUM CHLORIDE 0.9 % IV SOLN: 500.0000 mL | Freq: Once | INTRAVENOUS | Status: DC

## 2021-05-07 NOTE — Progress Notes (Signed)
Called to room to assist during endoscopic procedure.  Patient ID and intended procedure confirmed with present staff. Received instructions for my participation in the procedure from the performing physician.  

## 2021-05-07 NOTE — Progress Notes (Signed)
History and Physical Interval Note: ? ?05/07/2021 ?12:58 PM ? ?Leah Shea  has presented today for endoscopic procedure(s), with the diagnosis of No diagnosis found..  The various methods of evaluation and treatment have been discussed with the patient and/or family. After consideration of risks, benefits and other options for treatment, the patient has consented to  the endoscopic procedure(s). ? ? The patient's history has been reviewed, patient examined, no change in status, stable for endoscopic procedure(s).  I have reviewed the patient's chart and labs.  Questions were answered to the patient's satisfaction.   ? ? ?Vassie Kugel E. Tomasa Rand, MD ?[facility] ? ?

## 2021-05-07 NOTE — Op Note (Signed)
[facility] ?Patient Name: Leah Shea ?Procedure Date: 05/07/2021 1:18 PM ?MRN: [id number] ?Endoscopist: Deanette Tullius E. Candis Schatz , MD ?Age: 36 ?Referring MD:  ?Date of Birth: [birth year] ?Gender: Female ?Account #: [id number] ?Procedure:                Upper GI endoscopy ?Indications:              Epigastric abdominal pain, Nausea with vomiting ?Medicines:                Monitored Anesthesia Care ?Procedure:                Pre-Anesthesia Assessment: ?                          - Prior to the procedure, a History and Physical  ?                          was performed, and patient medications and  ?                          allergies were reviewed. The patient's tolerance of  ?                          previous anesthesia was also reviewed. The risks  ?                          and benefits of the procedure and the sedation  ?                          options and risks were discussed with the patient.  ?                          All questions were answered, and informed consent  ?                          was obtained. Prior Anticoagulants: The patient has  ?                          taken no previous anticoagulant or antiplatelet  ?                          agents. ASA Grade Assessment: III - A patient with  ?                          severe systemic disease. After reviewing the risks  ?                          and benefits, the patient was deemed in  ?                          satisfactory condition to undergo the procedure. ?                          After obtaining informed consent, the endoscope was  ?  passed under direct vision. Throughout the  ?                          procedure, the patient's blood pressure, pulse, and  ?                          oxygen saturations were monitored continuously. The  ?                          Endoscope was introduced through the mouth, and  ?                          advanced to the second part of duodenum. The upper  ?                           GI endoscopy was accomplished without difficulty.  ?                          The patient tolerated the procedure well. ?Scope In: ?Scope Out: ?Findings:                 The examined portions of the nasopharynx,  ?                          oropharynx and larynx were normal. ?                          The examined esophagus was normal. ?                          A 5 cm hiatal hernia was present. ?                          Normal mucosa was found in the entire examined  ?                          stomach. Biopsies were taken with a cold forceps  ?                          for Helicobacter pylori testing. Estimated blood  ?                          loss was minimal. ?                          The examined duodenum was normal. ?Complications:            No immediate complications. ?Estimated Blood Loss:     Estimated blood loss was minimal. ?Impression:               - The examined portions of the nasopharynx,  ?                          oropharynx and larynx were normal. ?                          -  Normal esophagus. ?                          - 5 cm hiatal hernia. ?                          - Normal mucosa was found in the entire stomach.  ?                          Biopsied. ?                          - Normal examined duodenum. ?                          - Given large hiatal hernia, suspect much of her  ?                          nausea and vomiting is related to acid reflux ?Recommendation:           - Patient has a contact number available for  ?                          emergencies. The signs and symptoms of potential  ?                          delayed complications were discussed with the  ?                          patient. Return to normal activities tomorrow.  ?                          Written discharge instructions were provided to the  ?                          patient. ?                          - Resume previous diet. ?                          - Continue present medications including omeprazole  ?                           twice daily as previously prescribed. ?                          - Await pathology results. ?                          - Follow GERD diet, avoid meals within 3 hours of  ?                          bedtime, recommend weight loss to help reduce GERD  ?  symptoms ?                          - Consider surgery (hernia repair/fundoplication)  ?                          if symptoms not improving with acid suppressive  ?                          therapy and behavioral modifications ?                          - Follow up in GI clinic in 2 months ?Kehinde Totzke E. Candis Schatz, MD ?05/07/2021 1:39:44 PM ?This report has been signed electronically. ?

## 2021-05-07 NOTE — Progress Notes (Signed)
Pt's states no medical or surgical changes since previsit or office visit. 

## 2021-05-07 NOTE — Patient Instructions (Addendum)
Handouts given for GERD and hiatal hernia. ? ?Office visit 07/07/21 with Dr Tomasa Rand, arrive at 930 for 950 appointment. ? ? ?YOU HAD AN ENDOSCOPIC PROCEDURE TODAY AT THE [facility]:   Refer to the procedure report that was given to you for any specific questions about what was found during the examination.  If the procedure report does not answer your questions, please call your gastroenterologist to clarify.  If you requested that your care partner not be given the details of your procedure findings, then the procedure report has been included in a sealed envelope for you to review at your convenience later. ? ?YOU SHOULD EXPECT: Some feelings of bloating in the abdomen. Passage of more gas than usual.  Walking can help get rid of the air that was put into your GI tract during the procedure and reduce the bloating. If you had a lower endoscopy (such as a colonoscopy or flexible sigmoidoscopy) you may notice spotting of blood in your stool or on the toilet paper. If you underwent a bowel prep for your procedure, you may not have a normal bowel movement for a few days. ? ?Please Note:  You might notice some irritation and congestion in your nose or some drainage.  This is from the oxygen used during your procedure.  There is no need for concern and it should clear up in a day or so. ? ?SYMPTOMS TO REPORT IMMEDIATELY: ? ?Following upper endoscopy (EGD) ? Vomiting of blood or coffee ground material ? New chest pain or pain under the shoulder blades ? Painful or persistently difficult swallowing ? New shortness of breath ? Fever of 100?F or higher ? Black, tarry-looking stools ? ?For urgent or emergent issues, a gastroenterologist can be reached at any hour by calling [phone]. ?Do not use MyChart messaging for urgent concerns.  ? ? ?DIET:  We do recommend a small meal at first, but then you may proceed to your regular diet.  Drink plenty of fluids but you should avoid alcoholic beverages for 24  hours. ? ?ACTIVITY:  You should plan to take it easy for the rest of today and you should NOT DRIVE or use heavy machinery until tomorrow (because of the sedation medicines used during the test).   ? ?FOLLOW UP: ?Our staff will call the number listed on your records 48-72 hours following your procedure to check on you and address any questions or concerns that you may have regarding the information given to you following your procedure. If we do not reach you, we will leave a message.  We will attempt to reach you two times.  During this call, we will ask if you have developed any symptoms of COVID 19. If you develop any symptoms (ie: fever, flu-like symptoms, shortness of breath, cough etc.) before then, please call [phone].  If you test positive for Covid 19 in the 2 weeks post procedure, please call and report this information to [place].   ? ?If any biopsies were taken you will be contacted by phone or by letter within the next 1-3 weeks.  Please call us at [phone] if you have not heard about the biopsies in 3 weeks.  ? ? ?SIGNATURES/CONFIDENTIALITY: ?You and/or your care partner have signed paperwork which will be entered into your electronic medical record.  These signatures attest to the fact that that the information above on your After Visit Summary has been reviewed and is understood.  Full responsibility of the confidentiality of this discharge  information lies with you and/or your care-partner.  ?

## 2021-05-07 NOTE — Progress Notes (Signed)
Pt awake, report to RN, VVS  °

## 2021-05-11 ENCOUNTER — Telehealth: Payer: Self-pay | Admitting: *Deleted

## 2021-05-11 NOTE — Telephone Encounter (Signed)
?  Follow up Call- ? ? ?  05/07/2021  ?  1:07 PM  ?Call back number  ?Post procedure Call Back phone  # [phone]  ?Permission to leave phone message Yes  ?  ? ?Patient questions: ? ?Do you have a fever, pain , or abdominal swelling? No. ?Pain Score  0 * ? ?Have you tolerated food without any problems? Yes.   ? ?Have you been able to return to your normal activities? Yes.   ? ?Do you have any questions about your discharge instructions: ?Diet   No. ?Medications  No. ?Follow up visit  No. ? ?Do you have questions or concerns about your Care? No. ? ?Actions: ?* If pain score is 4 or above: ?No action needed, pain <4. ? ? ?

## 2021-05-13 NOTE — Progress Notes (Signed)
Ms. Dray,  ?The biopsies taken from your stomach were notable for mild reactive gastropathy which is a common finding and often related to use of certain medications (usually NSAIDs), but there was no evidence of Helicobacter pylori infection. This common finding is not felt to necessarily be a cause of any particular symptom and there is no specific treatment or further evaluation recommended. ?

## 2021-05-18 ENCOUNTER — Emergency Department (HOSPITAL_COMMUNITY)
Admission: EM | Admit: 2021-05-18 | Discharge: 2021-05-19 | Disposition: A | Discharge status: Home / Self Care | Payer: No Typology Code available for payment source | Attending: Physician Assistant | Admitting: Physician Assistant

## 2021-05-18 ENCOUNTER — Other Ambulatory Visit: Payer: Self-pay

## 2021-05-18 ENCOUNTER — Encounter (HOSPITAL_COMMUNITY): Payer: Self-pay

## 2021-05-18 ENCOUNTER — Emergency Department (HOSPITAL_COMMUNITY): Payer: No Typology Code available for payment source

## 2021-05-18 DIAGNOSIS — Z5321 Procedure and treatment not carried out due to patient leaving prior to being seen by health care provider: Secondary | ICD-10-CM | POA: Insufficient documentation

## 2021-05-18 DIAGNOSIS — R1010 Upper abdominal pain, unspecified: Secondary | ICD-10-CM | POA: Diagnosis not present

## 2021-05-18 DIAGNOSIS — R112 Nausea with vomiting, unspecified: Secondary | ICD-10-CM | POA: Diagnosis not present

## 2021-05-18 DIAGNOSIS — R197 Diarrhea, unspecified: Secondary | ICD-10-CM | POA: Diagnosis not present

## 2021-05-18 LAB — CBC WITH DIFFERENTIAL/PLATELET
Abs Immature Granulocytes: 0.03 10*3/uL (ref 0.00–0.07)
Basophils Absolute: 0.1 10*3/uL (ref 0.0–0.1)
Basophils Relative: 1 %
Eosinophils Absolute: 0.1 10*3/uL (ref 0.0–0.5)
Eosinophils Relative: 1 %
HCT: 36.6 % (ref 36.0–46.0)
Hemoglobin: 12.1 g/dL (ref 12.0–15.0)
Immature Granulocytes: 0 %
Lymphocytes Relative: 29 %
Lymphs Abs: 3.1 10*3/uL (ref 0.7–4.0)
MCH: 28.3 pg (ref 26.0–34.0)
MCHC: 33.1 g/dL (ref 30.0–36.0)
MCV: 85.7 fL (ref 80.0–100.0)
Monocytes Absolute: 1 10*3/uL (ref 0.1–1.0)
Monocytes Relative: 9 %
Neutro Abs: 6.5 10*3/uL (ref 1.7–7.7)
Neutrophils Relative %: 60 %
Platelets: 328 10*3/uL (ref 150–400)
RBC: 4.27 MIL/uL (ref 3.87–5.11)
RDW: 14.1 % (ref 11.5–15.5)
WBC: 10.8 10*3/uL — ABNORMAL HIGH (ref 4.0–10.5)
nRBC: 0 % (ref 0.0–0.2)

## 2021-05-18 IMAGING — DX DG CHEST 2V
2 series · 2 of 2 positions shown · non-contrast
Comparison: [DATE]

CLINICAL DATA: Chest pain

EXAM:
CHEST - 2 VIEW

[w chest pa]
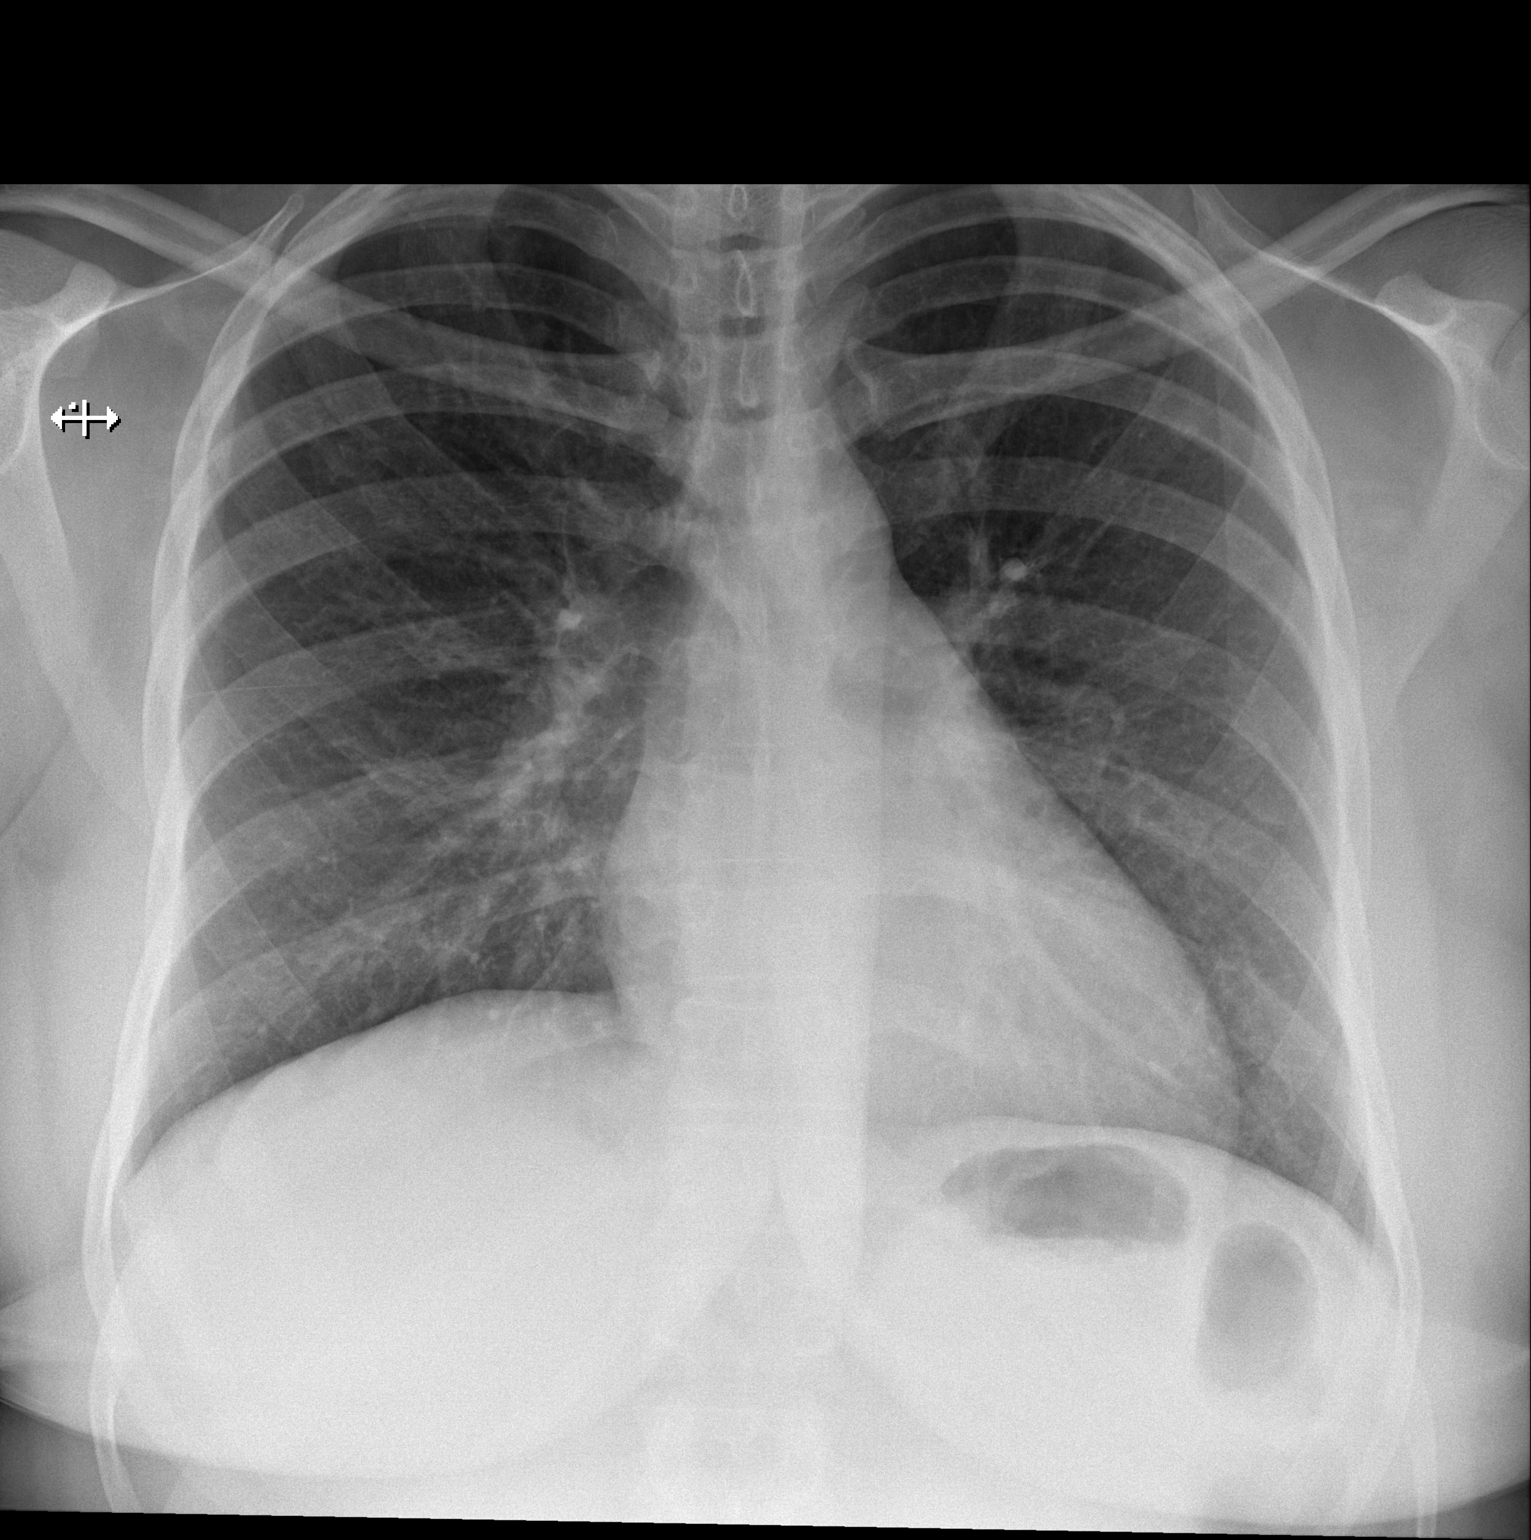

[w chest lat]
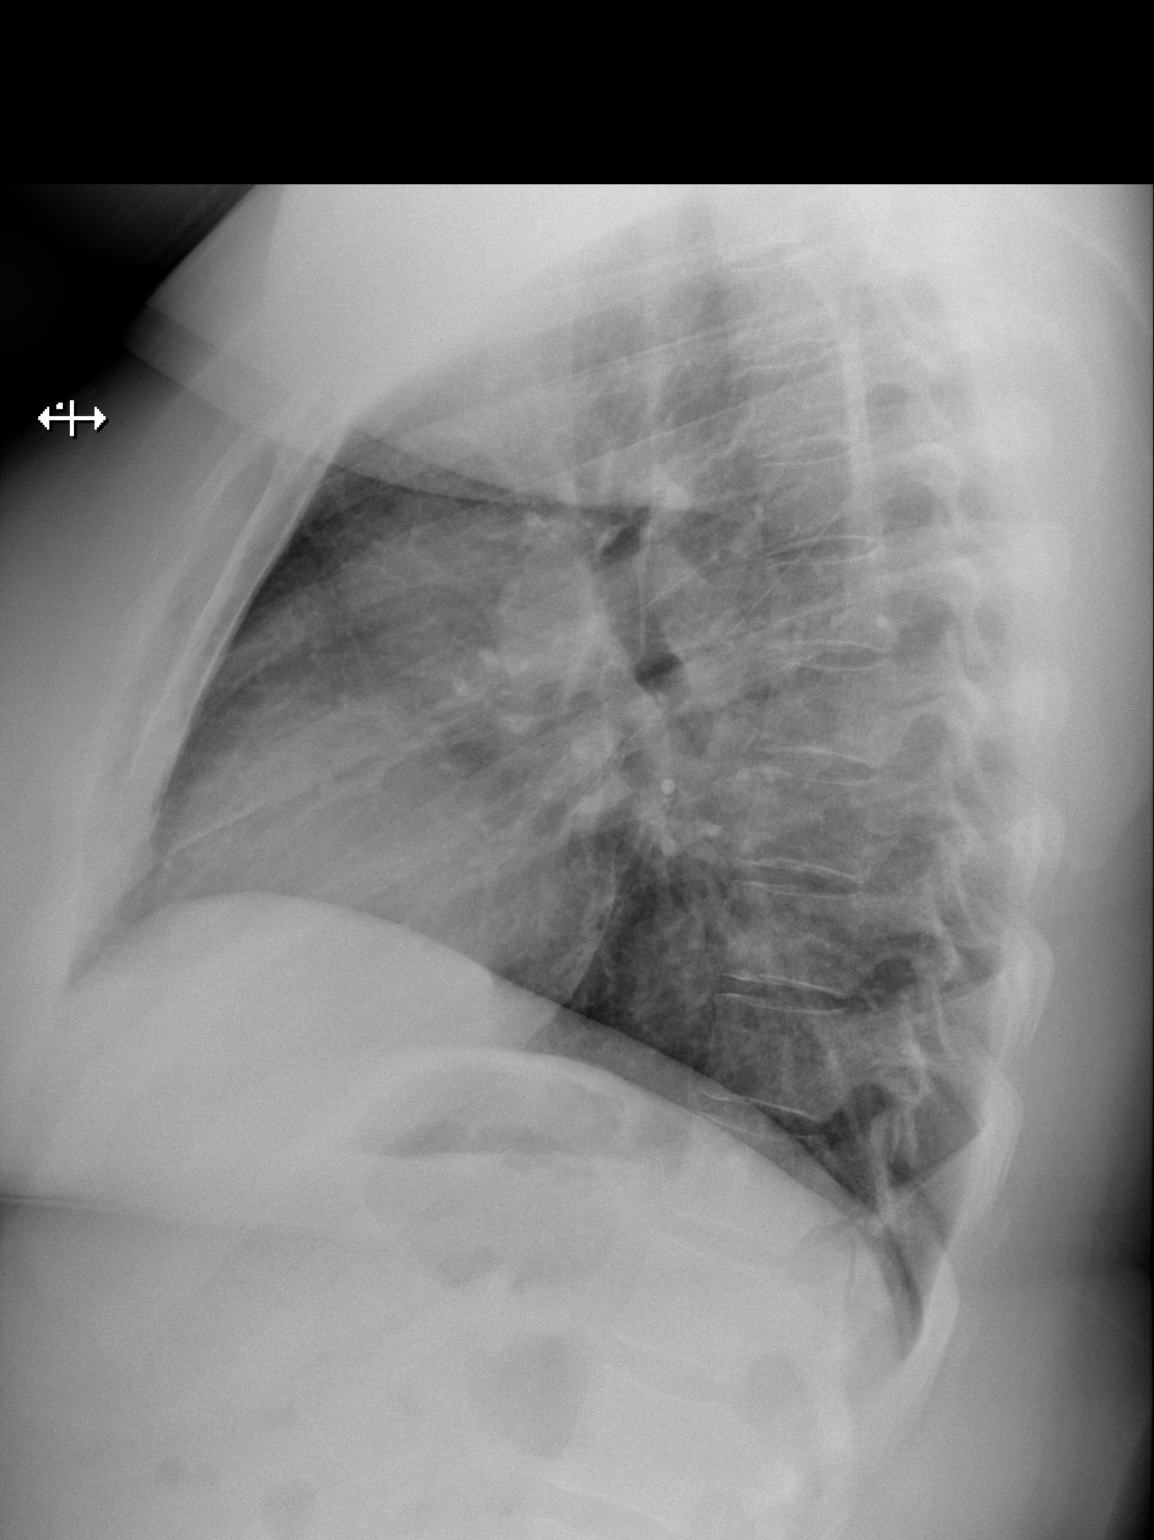

[2 of 2 positions shown; findings below may reference images not displayed]

FINDINGS: The heart size and mediastinal contours are within normal limits.
Both lungs are clear. The visualized skeletal structures are
unremarkable.
IMPRESSION: Normal study.

## 2021-05-18 NOTE — ED Triage Notes (Signed)
Pt reports sore throat associated with coughing up blood and pain with swallowing. She also reports abd pain "I think my hernia might be back." She is currently waiting on the results of an endoscopy she had 2 weeks ago. ?

## 2021-05-18 NOTE — ED Provider Triage Note (Signed)
Emergency Medicine Provider Triage Evaluation Note ? ?Leah Shea , a 36 y.o. female  was evaluated in triage.  Patient presents with multiple complaints.  Patient reports a history of recurrent nausea, vomiting, diarrhea, as well as upper abdominal pain since giving birth to her child 7 months ago.  She was recently referred to gastroenterology-- Dr. Tomasa Rand-- from the [facility] and had an EGD which showed mild gastropathy.  She states that she has not had any nausea or vomiting for the past few days but has been experiencing upper abdominal pain, chest pain, as well as a mild cough.  She states that she coughs to clear phlegm in her throat which she states is "mucousy with traces of blood".  Denies any rhinorrhea, congestion. ? ?Physical Exam  ?BP 124/75 (BP Location: Left Arm)   Pulse 94   Temp 98.9 ?F (37.2 ?C) (Oral)   Resp 16   LMP 05/18/2021 (Exact Date)   SpO2 98%  ?Gen:   Awake, no distress   ?Resp:  Normal effort  ?MSK:   Moves extremities without difficulty  ?Other:   ? ?Medical Decision Making  ?Medically screening exam initiated at 11:06 PM.  Appropriate orders placed.  Leah Shea was informed that the remainder of the evaluation will be completed by another provider, this initial triage assessment does not replace that evaluation, and the importance of remaining in the ED until their evaluation is complete. ?  ?Placido Sou, PA-C ?05/18/21 2308 ? ?

## 2021-05-19 LAB — COMPREHENSIVE METABOLIC PANEL
ALT: 15 U/L (ref 0–44)
AST: 15 U/L (ref 15–41)
Albumin: 3.7 g/dL (ref 3.5–5.0)
Alkaline Phosphatase: 88 U/L (ref 38–126)
Anion gap: 8 (ref 5–15)
BUN: 11 mg/dL (ref 6–20)
CO2: 24 mmol/L (ref 22–32)
Calcium: 9.7 mg/dL (ref 8.9–10.3)
Chloride: 107 mmol/L (ref 98–111)
Creatinine, Ser: 0.88 mg/dL (ref 0.44–1.00)
GFR, Estimated: >60 mL/min (ref >60)
Glucose, Bld: 113 mg/dL — ABNORMAL HIGH (ref 70–99)
Potassium: 4.3 mmol/L (ref 3.5–5.1)
Sodium: 139 mmol/L (ref 135–145)
Total Bilirubin: 0.4 mg/dL (ref 0.3–1.2)
Total Protein: 7.5 g/dL (ref 6.5–8.1)

## 2021-05-19 LAB — TROPONIN I (HIGH SENSITIVITY): Troponin I (High Sensitivity): 5 ng/L (ref <18)

## 2021-05-19 LAB — I-STAT BETA HCG BLOOD, ED (MC, WL, AP ONLY): I-stat hCG, quantitative: 13 m[IU]/mL — ABNORMAL HIGH (ref <5)

## 2021-05-19 LAB — LIPASE, BLOOD: Lipase: 27 U/L (ref 11–51)

## 2021-05-19 MED ORDER — LIDOCAINE VISCOUS HCL 2 % MT SOLN
15.0000 mL | Freq: Once | OROMUCOSAL | Status: AC
  Administered 2021-05-19: 15 mL via ORAL
  Filled 2021-05-19: qty 15

## 2021-05-19 MED ORDER — ALUM & MAG HYDROXIDE-SIMETH 200-200-20 MG/5ML PO SUSP
30.0000 mL | Freq: Once | ORAL | Status: AC
  Administered 2021-05-19: 30 mL via ORAL
  Filled 2021-05-19: qty 30

## 2021-05-19 NOTE — ED Notes (Signed)
Pt requested pain medication, triage nurse notified.  ?

## 2021-05-19 NOTE — ED Notes (Signed)
X2 for vitals with no response. Phleb looking for patient for blood draw with no response as well ?

## 2021-05-20 ENCOUNTER — Encounter (HOSPITAL_COMMUNITY): Payer: Self-pay | Admitting: Family Medicine

## 2021-05-20 ENCOUNTER — Other Ambulatory Visit: Payer: Self-pay

## 2021-05-20 ENCOUNTER — Inpatient Hospital Stay (HOSPITAL_COMMUNITY)
Admission: AD | Admit: 2021-05-20 | Discharge: 2021-05-21 | Disposition: A | Discharge status: Home / Self Care | Payer: Medicaid Other | Attending: Family Medicine | Admitting: Family Medicine

## 2021-05-20 DIAGNOSIS — R109 Unspecified abdominal pain: Secondary | ICD-10-CM | POA: Diagnosis not present

## 2021-05-20 DIAGNOSIS — O039 Complete or unspecified spontaneous abortion without complication: Secondary | ICD-10-CM | POA: Diagnosis present

## 2021-05-20 NOTE — MAU Note (Signed)
Jennifer Rasch Np in Family Rm to see pt and discuss plan of care ?
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
## 2021-05-21 DIAGNOSIS — O039 Complete or unspecified spontaneous abortion without complication: Secondary | ICD-10-CM

## 2021-05-21 LAB — CBC
HCT: 32.9 % — ABNORMAL LOW (ref 36.0–46.0)
Hemoglobin: 11.1 g/dL — ABNORMAL LOW (ref 12.0–15.0)
MCH: 28.7 pg (ref 26.0–34.0)
MCHC: 33.7 g/dL (ref 30.0–36.0)
MCV: 85 fL (ref 80.0–100.0)
Platelets: 303 10*3/uL (ref 150–400)
RBC: 3.87 MIL/uL (ref 3.87–5.11)
RDW: 13.7 % (ref 11.5–15.5)
WBC: 7.8 10*3/uL (ref 4.0–10.5)
nRBC: 0 % (ref 0.0–0.2)

## 2021-05-21 LAB — HCG, SERUM, QUALITATIVE: Preg, Serum: NEGATIVE

## 2021-05-21 MED ORDER — HYDROCODONE-ACETAMINOPHEN 5-325 MG PO TABS
2.0000 | ORAL_TABLET | Freq: Once | ORAL | Status: AC
  Administered 2021-05-21: 2 via ORAL
  Filled 2021-05-21: qty 2

## 2021-05-21 NOTE — Progress Notes (Signed)
Written and verbal d/c instructions given and understanding voiced. 

## 2021-05-22 ENCOUNTER — Emergency Department (HOSPITAL_COMMUNITY): Payer: No Typology Code available for payment source

## 2021-05-22 ENCOUNTER — Encounter (HOSPITAL_COMMUNITY): Payer: Self-pay | Admitting: Emergency Medicine

## 2021-05-22 ENCOUNTER — Other Ambulatory Visit: Payer: Self-pay

## 2021-05-22 ENCOUNTER — Emergency Department (HOSPITAL_COMMUNITY)
Admission: EM | Admit: 2021-05-22 | Discharge: 2021-05-23 | Disposition: A | Discharge status: Home / Self Care | Payer: No Typology Code available for payment source | Attending: Emergency Medicine | Admitting: Emergency Medicine

## 2021-05-22 DIAGNOSIS — R6884 Jaw pain: Secondary | ICD-10-CM | POA: Insufficient documentation

## 2021-05-22 DIAGNOSIS — J029 Acute pharyngitis, unspecified: Secondary | ICD-10-CM | POA: Insufficient documentation

## 2021-05-22 DIAGNOSIS — H6691 Otitis media, unspecified, right ear: Secondary | ICD-10-CM | POA: Insufficient documentation

## 2021-05-22 DIAGNOSIS — Z20822 Contact with and (suspected) exposure to covid-19: Secondary | ICD-10-CM | POA: Insufficient documentation

## 2021-05-22 DIAGNOSIS — D649 Anemia, unspecified: Secondary | ICD-10-CM | POA: Insufficient documentation

## 2021-05-22 DIAGNOSIS — M542 Cervicalgia: Secondary | ICD-10-CM | POA: Insufficient documentation

## 2021-05-22 LAB — CBC WITH DIFFERENTIAL/PLATELET
Abs Immature Granulocytes: 0.04 K/uL (ref 0.00–0.07)
Basophils Absolute: 0.1 K/uL (ref 0.0–0.1)
Basophils Relative: 1 %
Eosinophils Absolute: 0.1 K/uL (ref 0.0–0.5)
Eosinophils Relative: 1 %
HCT: 34.7 % — ABNORMAL LOW (ref 36.0–46.0)
Hemoglobin: 11.6 g/dL — ABNORMAL LOW (ref 12.0–15.0)
Immature Granulocytes: 0 %
Lymphocytes Relative: 33 %
Lymphs Abs: 3.2 K/uL (ref 0.7–4.0)
MCH: 28.2 pg (ref 26.0–34.0)
MCHC: 33.4 g/dL (ref 30.0–36.0)
MCV: 84.4 fL (ref 80.0–100.0)
Monocytes Absolute: 0.6 K/uL (ref 0.1–1.0)
Monocytes Relative: 6 %
Neutro Abs: 5.9 K/uL (ref 1.7–7.7)
Neutrophils Relative %: 59 %
Platelets: 316 K/uL (ref 150–400)
RBC: 4.11 MIL/uL (ref 3.87–5.11)
RDW: 13.4 % (ref 11.5–15.5)
WBC: 9.9 K/uL (ref 4.0–10.5)
nRBC: 0 % (ref 0.0–0.2)

## 2021-05-22 LAB — BASIC METABOLIC PANEL
Anion gap: 10 (ref 5–15)
BUN: 9 mg/dL (ref 6–20)
CO2: 21 mmol/L — ABNORMAL LOW (ref 22–32)
Calcium: 9.4 mg/dL (ref 8.9–10.3)
Chloride: 106 mmol/L (ref 98–111)
Creatinine, Ser: 0.81 mg/dL (ref 0.44–1.00)
GFR, Estimated: >60 mL/min (ref >60)
Glucose, Bld: 90 mg/dL (ref 70–99)
Potassium: 3.9 mmol/L (ref 3.5–5.1)
Sodium: 137 mmol/L (ref 135–145)

## 2021-05-22 LAB — RESP PANEL BY RT-PCR (FLU A&B, COVID) ARPGX2
Influenza A by PCR: NEGATIVE
Influenza B by PCR: NEGATIVE
SARS Coronavirus 2 by RT PCR: NEGATIVE

## 2021-05-22 LAB — GROUP A STREP BY PCR: Group A Strep by PCR: NOT DETECTED

## 2021-05-22 IMAGING — CT CT NECK W/ CM
3 of 5 series · 11 of 35 positions shown, 13 images · IV contrast (APPLIED)
Comparison: [DATE] CT neck

CLINICAL DATA: Sore throat

EXAM:
CT NECK WITH CONTRAST
TECHNIQUE: Multidetector CT imaging of the neck was performed using the
standard protocol following the bolus administration of intravenous
contrast.

[Series 5: sag neck · sagittal · 0.41mm/px · 5 of 111 slices shown, 6 images]
[im 37/111  bone]
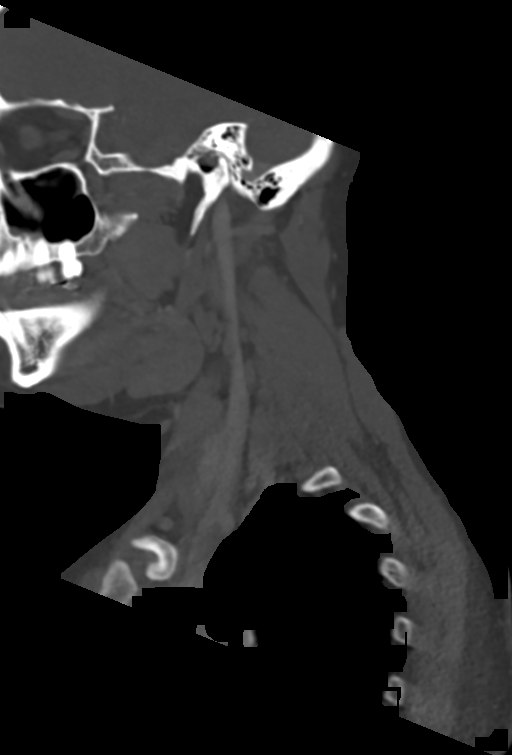
[im 46/111  bone]
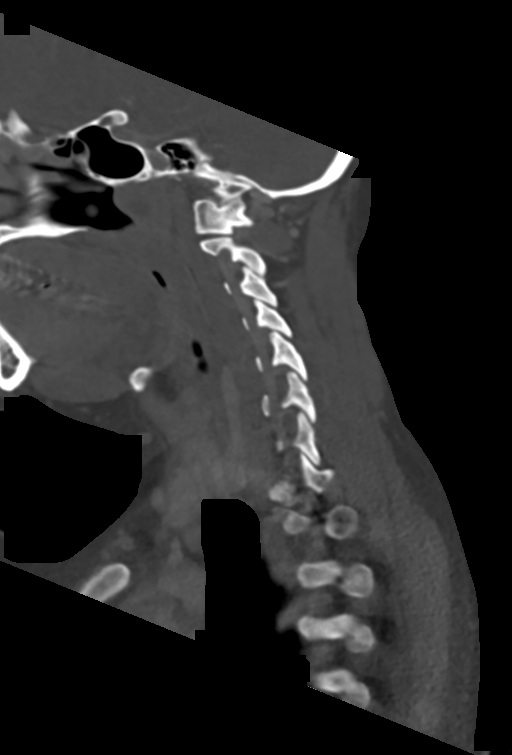
[im 56/111  soft-tissue]
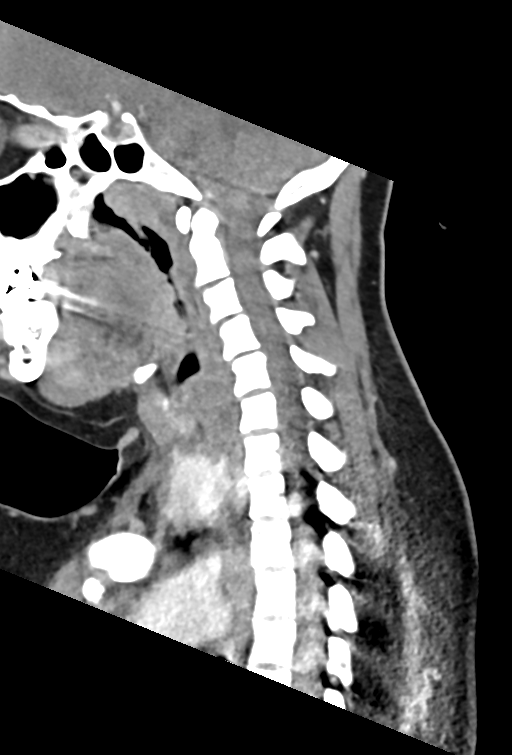
[im 56/111  bone]
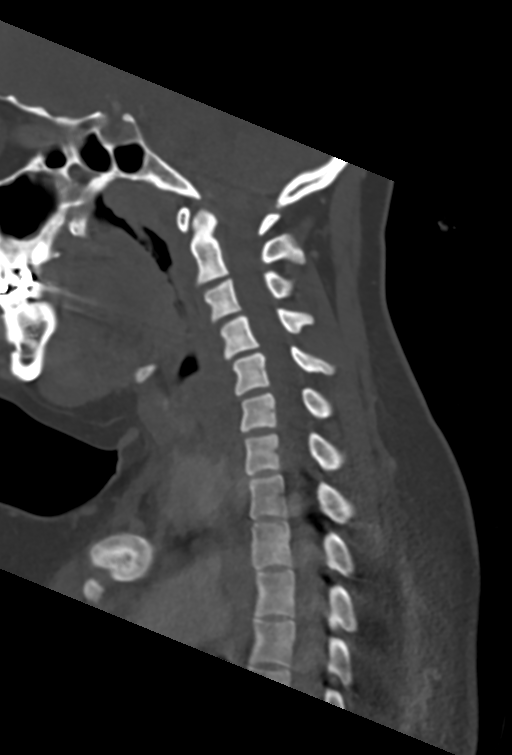
[im 65/111  bone]
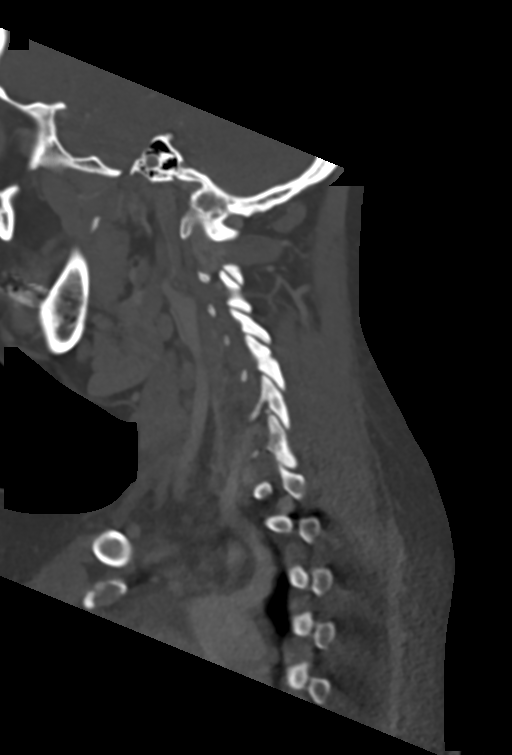
[im 74/111  bone]
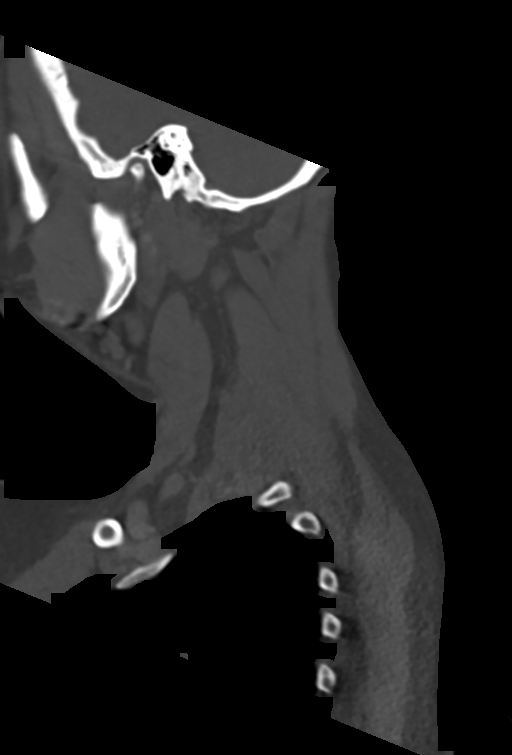

[Series 6: cor neck · coronal · 0.40mm/px · 3 of 159 slices shown]
[im 60/159  bone]
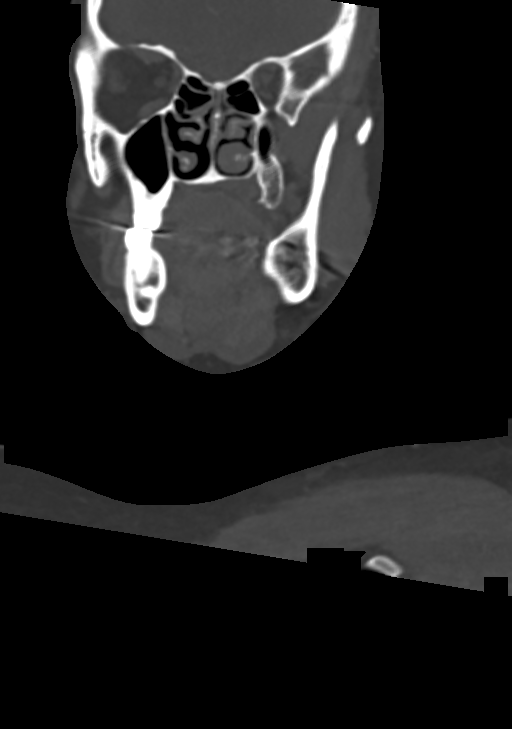
[im 72/159  bone]
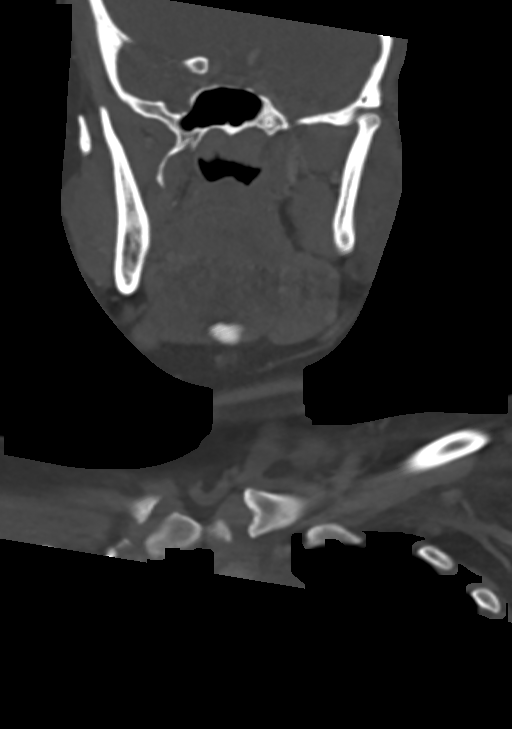
[im 85/159  bone]
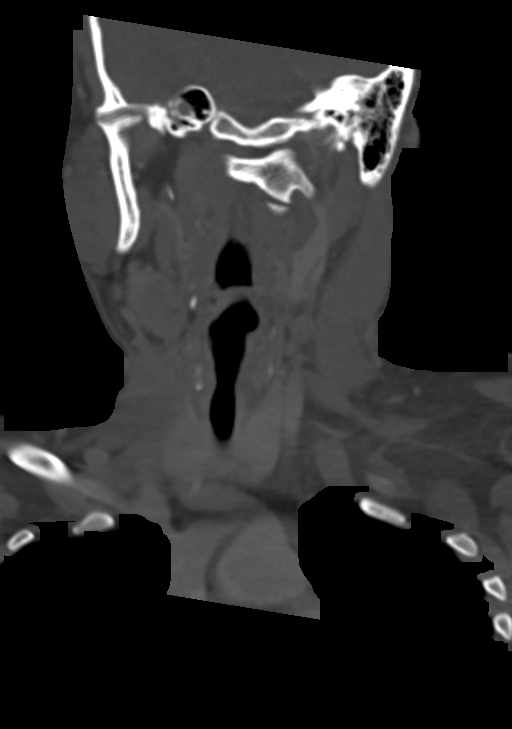

[Series 7: ax oropharynx · axial · 0.43mm/px · z∈[-816,-662]mm · 3 of 146 slices shown, 4 images]
[im 30/146  soft-tissue]
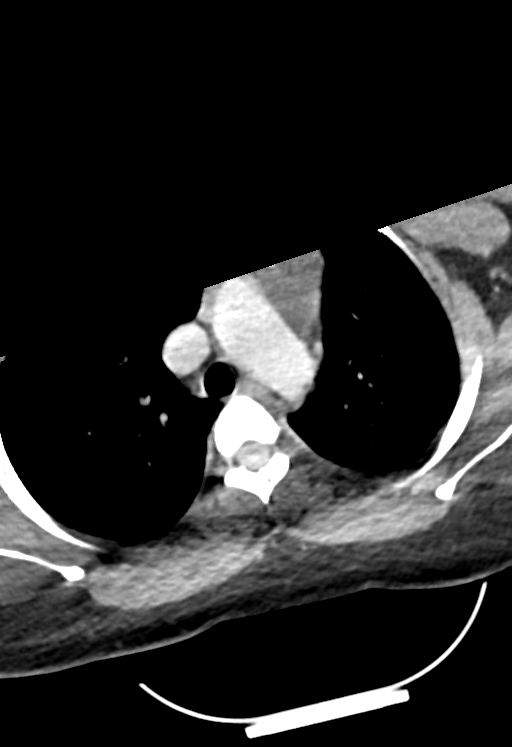
[im 30/146  bone]
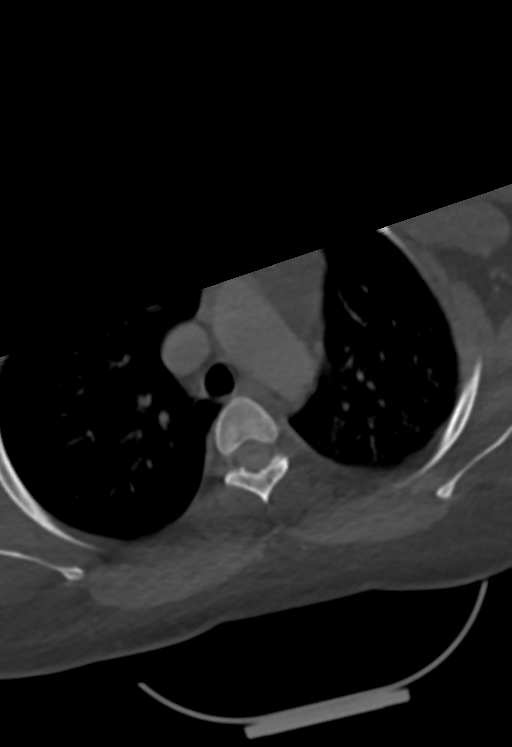
[im 88/146  bone]
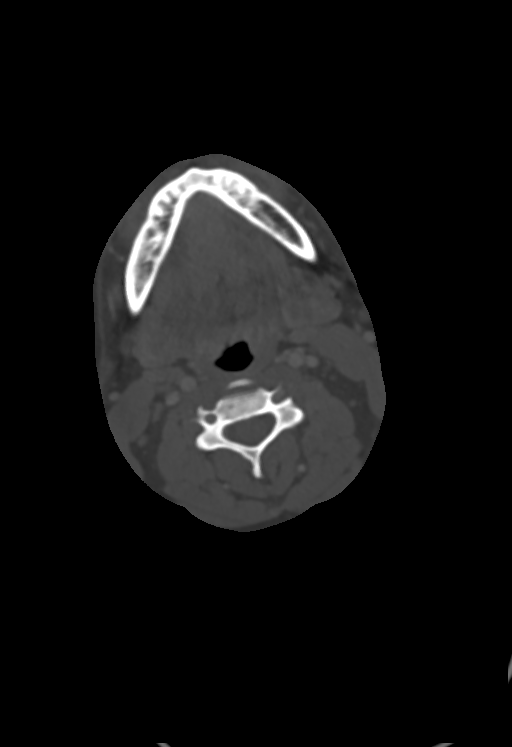
[im 117/146  bone]
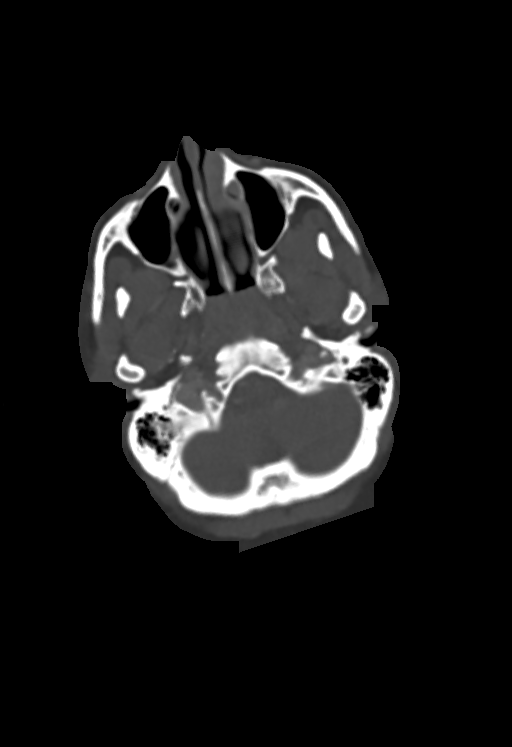

[11 of 35 positions shown; findings below may reference images not displayed]

RADIATION DOSE REDUCTION: This exam was performed according to the
departmental dose-optimization program which includes automated
exposure control, adjustment of the mA and/or kV according to
patient size and/or use of iterative reconstruction technique.

CONTRAST:  75mL OMNIPAQUE IOHEXOL 350 MG/ML SOLN
FINDINGS: PHARYNX AND LARYNX: Unchanged prominence of the adenoid and lingual
tonsils. Otherwise the nasopharynx, oropharynx and larynx are
normal. Visible portions of the oral cavity, tongue base and floor
of mouth are normal. Normal epiglottis, vallecula and pyriform
sinuses. The larynx is normal. No retropharyngeal abscess, effusion
or lymphadenopathy.

SALIVARY GLANDS: Normal parotid, submandibular and sublingual
glands.

THYROID: Normal.

LYMPH NODES: No enlarged or abnormal density lymph nodes.

VASCULAR: Major cervical vessels are patent.

LIMITED INTRACRANIAL: Normal.

VISUALIZED ORBITS: Normal.

MASTOIDS AND VISUALIZED PARANASAL SINUSES: No fluid levels or
advanced mucosal thickening. No mastoid effusion.

SKELETON: No bony spinal canal stenosis. No lytic or blastic
lesions.

UPPER CHEST: Clear.

OTHER: None.
IMPRESSION: Unchanged prominence of the adenoid and lingual tonsils. No acute
abnormality of the neck.

## 2021-05-22 MED ORDER — AMOXICILLIN-POT CLAVULANATE 875-125 MG PO TABS
1.0000 | ORAL_TABLET | Freq: Once | ORAL | Status: AC
  Administered 2021-05-23: 1 via ORAL
  Filled 2021-05-22: qty 1

## 2021-05-22 MED ORDER — DEXAMETHASONE SODIUM PHOSPHATE 10 MG/ML IJ SOLN
10.0000 mg | Freq: Once | INTRAMUSCULAR | Status: AC
  Administered 2021-05-22: 10 mg via INTRAVENOUS
  Filled 2021-05-22: qty 1

## 2021-05-22 MED ORDER — AMOXICILLIN-POT CLAVULANATE 875-125 MG PO TABS: 1.0000 | ORAL_TABLET | Freq: Two times a day (BID) | ORAL | 0 refills | Status: DC

## 2021-05-22 MED ORDER — SODIUM CHLORIDE 0.9 % IV BOLUS
1000.0000 mL | Freq: Once | INTRAVENOUS | Status: AC
  Administered 2021-05-22: 1000 mL via INTRAVENOUS

## 2021-05-22 MED ORDER — MORPHINE SULFATE (PF) 4 MG/ML IV SOLN
4.0000 mg | Freq: Once | INTRAVENOUS | Status: AC
  Administered 2021-05-22: 4 mg via INTRAVENOUS
  Filled 2021-05-22: qty 1

## 2021-05-22 MED ORDER — IOHEXOL 350 MG/ML SOLN
75.0000 mL | Freq: Once | INTRAVENOUS | Status: AC | PRN
  Administered 2021-05-22: 75 mL via INTRAVENOUS

## 2021-05-22 NOTE — ED Notes (Signed)
This Rn went in room to assess patient and the patient was sleeping ?

## 2021-05-22 NOTE — ED Notes (Signed)
Patient transported to CT 

## 2021-05-22 NOTE — ED Notes (Signed)
Pt has returned from CT.  

## 2021-05-22 NOTE — ED Provider Notes (Signed)
?[facility] EMERGENCY DEPARTMENT ?Provider Note ? ? ?CSN: [id number] ?Arrival date & time: 05/22/21  1803 ? ?  ? ?History ? ?Chief Complaint  ?Patient presents with  ? Oral Swelling  ? Sore Throat  ? ? ?Leah Shea is a 36 y.o. female with chief complaint of unilateral hearing loss/muffled hearing, difficulty swallowing, right sided neck pain and jaw pain, with worsening vision changes.  Endorses chronic diplopia but states it has now worsened.  Also chronic vomiting in the morning due to GERD and hiatal hernia, but now reports some "a tinge of blood".  Seeing GI for the GERD/vomiting, recent EGD on 04/17/21 showed nothing acute.  Hx of epilepsy with poor compliance.  Has not taken her Keppra in one month.  Hx of migraines.  Hx of anxiety and depression.  Denies recent trauma or known seizure. ? ?The history is provided by the patient and medical records.  ?Sore Throat ? ? ?  ? ?Home Medications ?Prior to Admission medications   ?Medication Sig Start Date End Date Taking? Authorizing Provider  ?amoxicillin-clavulanate (AUGMENTIN) 875-125 MG tablet Take 1 tablet by mouth 2 (two) times daily. One po bid x 7 days 05/22/21  Yes Cecil Cobbs, PA-C  ?acetaminophen (TYLENOL) 500 MG tablet Take 1 tablet (500 mg total) by mouth every 6 (six) hours as needed. 11/12/20   Constant, Peggy, MD  ?escitalopram (LEXAPRO) 20 MG tablet TAKE ONE TABLET BY MOUTH ONCE A DAY FOR DEPRESSION AND ANXIETY 11/10/20   [provider]  ?hyoscyamine (ANASPAZ) 0.125 MG TBDP disintergrating tablet Place 1 tablet (0.125 mg total) under the tongue every 4 (four) hours as needed. 05/05/21   Jenel Lucks, MD  ?LORazepam (ATIVAN) 0.5 MG tablet Take 1 tablet (0.5 mg total) by mouth every 6 (six) hours as needed for anxiety or sleep. 10/02/20   Milas Hock, MD  ?omeprazole (PRILOSEC) 20 MG capsule Take 1 capsule (20 mg total) by mouth daily. 05/05/21   Jenel Lucks, MD  ?ondansetron (ZOFRAN ODT) 4 MG  disintegrating tablet Take 1 tablet (4 mg total) by mouth every 8 (eight) hours as needed for nausea or vomiting. 11/02/20   Holley Dexter, MD  ?promethazine (PHENERGAN) 25 MG suppository Place 25 mg rectally every 6 (six) hours as needed for nausea or vomiting.    [provider]  ?promethazine (PHENERGAN) 25 MG tablet Take 25 mg by mouth every 6 (six) hours as needed for nausea or vomiting.    [provider]  ?   ? ?Allergies    ?Mushroom extract complex, Other, Nsaids, Prenatal vitamins, Reglan [metoclopramide], and Sulfa antibiotics   ? ?Review of Systems   ?Review of Systems  ?HENT:  Positive for ear pain, facial swelling, hearing loss, sore throat and trouble swallowing. Negative for dental problem.   ?Eyes:  Positive for visual disturbance.  ?Gastrointestinal:  Positive for vomiting.  ?Neurological:  Positive for dizziness.  ? ?Physical Exam ?Updated Vital Signs ?BP (!) 139/95   Pulse 85   Temp 98.4 ?F (36.9 ?C) (Oral)   Resp 16   Ht 5\' 6"  (1.676 m)   Wt 100 kg   LMP 03/28/2021 (Exact Date)   SpO2 98%   BMI 35.58 kg/m?  ?Physical Exam ?Vitals and nursing note reviewed.  ?Constitutional:   ?   General: She is not in acute distress. ?   Appearance: She is well-developed. She is not ill-appearing or diaphoretic.  ?HENT:  ?   Head: Normocephalic and atraumatic.  No raccoon eyes or Battle's sign.  ?   Jaw: Tenderness and swelling present.  ?   Right Ear: Decreased hearing noted. Tympanic membrane is bulging.  ?   Left Ear: Hearing, tympanic membrane, ear canal and external ear normal.  ?   Ears:  ?   Comments: Poor visualization of TM due to warmth causing condensation of lens, but mild bulging still appreciated on exam ?   Nose: Congestion present.  ?   Mouth/Throat:  ?   Mouth: Mucous membranes are moist.  ?   Pharynx: Oropharynx is clear. Uvula midline. No oropharyngeal exudate, posterior oropharyngeal erythema or uvula swelling.  ?   Tonsils: No tonsillar exudate or tonsillar  abscesses.  ?Eyes:  ?   General: Lids are normal. Vision grossly intact. No visual field deficit or scleral icterus.    ?   Right eye: No foreign body, discharge or hordeolum.     ?   Left eye: No foreign body, discharge or hordeolum.  ?   Extraocular Movements:  ?   Right eye: Normal extraocular motion and no nystagmus.  ?   Left eye: Normal extraocular motion and no nystagmus.  ?   Conjunctiva/sclera: Conjunctivae normal.  ?   Pupils: Pupils are equal, round, and reactive to light.  ?   Visual Fields: Right eye visual fields normal and left eye visual fields normal.  ?Cardiovascular:  ?   Rate and Rhythm: Normal rate and regular rhythm.  ?   Heart sounds: Normal heart sounds. No murmur heard. ?Pulmonary:  ?   Effort: Pulmonary effort is normal. No respiratory distress.  ?   Breath sounds: Normal breath sounds.  ?Abdominal:  ?   Palpations: Abdomen is soft.  ?   Tenderness: There is no abdominal tenderness.  ?Musculoskeletal:     ?   General: No swelling.  ?   Cervical back: Neck supple.  ?Skin: ?   General: Skin is warm and dry.  ?   Capillary Refill: Capillary refill takes less than 2 seconds.  ?Neurological:  ?   Mental Status: She is alert and oriented to person, place, and time.  ?   GCS: GCS eye subscore is 4. GCS verbal subscore is 5. GCS motor subscore is 6.  ?   Cranial Nerves: No dysarthria or facial asymmetry.  ?   Motor: Motor function is intact. No tremor, seizure activity or pronator drift.  ?   Coordination: Coordination normal. Finger-Nose-Finger Test and Heel to Riverbend[place]in Test normal.  ?Psychiatric:     ?   Mood and Affect: Mood normal.  ? ? ?ED Results / Procedures / Treatments   ?Labs ?(all labs ordered are listed, but only abnormal results are displayed) ?Labs Reviewed  ?BASIC METABOLIC PANEL - Abnormal; Notable for the following components:  ?    Result Value  ? CO2 21 (*)   ? All other components within normal limits  ?CBC WITH DIFFERENTIAL/PLATELET - Abnormal; Notable for the following components:   ? Hemoglobin 11.6 (*)   ? HCT 34.7 (*)   ? All other components within normal limits  ?GROUP A STREP BY PCR  ?RESP PANEL BY RT-PCR (FLU A&B, COVID) [id number]  ? ? ?EKG ?None ? ?Radiology ?CT Soft Tissue Neck W Contrast ? ?Result Date: 05/22/2021 ?CLINICAL DATA:  Sore throat EXAM: CT NECK WITH CONTRAST TECHNIQUE: Multidetector CT imaging of the neck was performed using the standard protocol following the bolus administration of intravenous contrast. RADIATION DOSE REDUCTION: This exam  was performed according to the departmental dose-optimization program which includes automated exposure control, adjustment of the mA and/or kV according to patient size and/or use of iterative reconstruction technique. CONTRAST:  27mL OMNIPAQUE IOHEXOL 350 MG/ML SOLN COMPARISON:  01/27/2020 CT neck FINDINGS: PHARYNX AND LARYNX: Unchanged prominence of the adenoid and lingual tonsils. Otherwise the nasopharynx, oropharynx and larynx are normal. Visible portions of the oral cavity, tongue base and floor of mouth are normal. Normal epiglottis, vallecula and pyriform sinuses. The larynx is normal. No retropharyngeal abscess, effusion or lymphadenopathy. SALIVARY GLANDS: Normal parotid, submandibular and sublingual glands. THYROID: Normal. LYMPH NODES: No enlarged or abnormal density lymph nodes. VASCULAR: Major cervical vessels are patent. LIMITED INTRACRANIAL: Normal. VISUALIZED ORBITS: Normal. MASTOIDS AND VISUALIZED PARANASAL SINUSES: No fluid levels or advanced mucosal thickening. No mastoid effusion. SKELETON: No bony spinal canal stenosis. No lytic or blastic lesions. UPPER CHEST: Clear. OTHER: None. IMPRESSION: Unchanged prominence of the adenoid and lingual tonsils. No acute abnormality of the neck. Electronically Signed   By: Deatra Robinson M.D.   On: 05/22/2021 22:16   ? ?Procedures ?Procedures  ? ? ?Medications Ordered in ED ?Medications  ?sodium chloride 0.9 % bolus 1,000 mL (0 mLs Intravenous Stopped 05/22/21 2300)  ?dexamethasone  (DECADRON) injection 10 mg (10 mg Intravenous Given 05/22/21 2140)  ?morphine (PF) 4 MG/ML injection 4 mg (4 mg Intravenous Given 05/22/21 2139)  ?iohexol (OMNIPAQUE) 350 MG/ML injection 75 mL (75 mLs Intravenous Contrast Given 4

## 2021-05-22 NOTE — ED Triage Notes (Signed)
Pt reports sore throat stating she is coughing up blood and difficulty swallowing. Pt also complaining of loss of hearing to the R ear at this time. Pt states she is very congested. Pt states she has a hiatal hernia and per her provider "coughing up blood may be from this". VSS. NAD at this time.  ?

## 2021-05-23 NOTE — Discharge Instructions (Signed)
An antibiotic by the name of Augmentin has been sent to your pharmacy.  Please take one tablet twice a day with meals for the full course (7 days)  ? ?Follow up with primary care in the next 5-7 days for re-evaluation and continued medical management ? ?Return to the ED for new or worsening symptoms as discussed ?

## 2021-05-23 NOTE — ED Notes (Signed)
Pt A&OX4 ambulatory at d/c with independent steady gait, NAD. Pt verbalized understanding of d/c instructions, prescriptions and follow up care. 

## 2021-06-29 ENCOUNTER — Emergency Department (HOSPITAL_COMMUNITY): Payer: No Typology Code available for payment source

## 2021-06-29 ENCOUNTER — Ambulatory Visit (HOSPITAL_COMMUNITY)
Admission: EM | Admit: 2021-06-29 | Discharge: 2021-06-29 | Disposition: A | Discharge status: Home / Self Care | Payer: No Typology Code available for payment source

## 2021-06-29 ENCOUNTER — Emergency Department (HOSPITAL_COMMUNITY)
Admission: EM | Admit: 2021-06-29 | Discharge: 2021-06-29 | Disposition: A | Discharge status: Home / Self Care | Payer: No Typology Code available for payment source | Attending: Emergency Medicine | Admitting: Emergency Medicine

## 2021-06-29 ENCOUNTER — Encounter (HOSPITAL_COMMUNITY): Payer: Self-pay | Admitting: *Deleted

## 2021-06-29 ENCOUNTER — Other Ambulatory Visit: Payer: Self-pay

## 2021-06-29 DIAGNOSIS — R1110 Vomiting, unspecified: Secondary | ICD-10-CM | POA: Diagnosis not present

## 2021-06-29 DIAGNOSIS — Z9889 Other specified postprocedural states: Secondary | ICD-10-CM

## 2021-06-29 DIAGNOSIS — N9489 Other specified conditions associated with female genital organs and menstrual cycle: Secondary | ICD-10-CM | POA: Diagnosis not present

## 2021-06-29 DIAGNOSIS — Z8719 Personal history of other diseases of the digestive system: Secondary | ICD-10-CM | POA: Diagnosis not present

## 2021-06-29 DIAGNOSIS — R1032 Left lower quadrant pain: Secondary | ICD-10-CM

## 2021-06-29 DIAGNOSIS — K5900 Constipation, unspecified: Secondary | ICD-10-CM | POA: Insufficient documentation

## 2021-06-29 LAB — COMPREHENSIVE METABOLIC PANEL
ALT: 14 U/L (ref 0–44)
AST: 13 U/L — ABNORMAL LOW (ref 15–41)
Albumin: 3.7 g/dL (ref 3.5–5.0)
Alkaline Phosphatase: 82 U/L (ref 38–126)
Anion gap: 7 (ref 5–15)
BUN: 11 mg/dL (ref 6–20)
CO2: 22 mmol/L (ref 22–32)
Calcium: 9.5 mg/dL (ref 8.9–10.3)
Chloride: 110 mmol/L (ref 98–111)
Creatinine, Ser: 0.86 mg/dL (ref 0.44–1.00)
GFR, Estimated: >60 mL/min (ref >60)
Glucose, Bld: 102 mg/dL — ABNORMAL HIGH (ref 70–99)
Potassium: 4 mmol/L (ref 3.5–5.1)
Sodium: 139 mmol/L (ref 135–145)
Total Bilirubin: 0.1 mg/dL — ABNORMAL LOW (ref 0.3–1.2)
Total Protein: 7.3 g/dL (ref 6.5–8.1)

## 2021-06-29 LAB — URINALYSIS, ROUTINE W REFLEX MICROSCOPIC
Bilirubin Urine: NEGATIVE
Glucose, UA: NEGATIVE mg/dL
Hgb urine dipstick: NEGATIVE
Ketones, ur: NEGATIVE mg/dL
Leukocytes,Ua: NEGATIVE
Nitrite: NEGATIVE
Protein, ur: NEGATIVE mg/dL
Specific Gravity, Urine: 1.012 (ref 1.005–1.030)
pH: 5 (ref 5.0–8.0)

## 2021-06-29 LAB — CBC WITH DIFFERENTIAL/PLATELET
Abs Immature Granulocytes: 0.02 10*3/uL (ref 0.00–0.07)
Basophils Absolute: 0.1 10*3/uL (ref 0.0–0.1)
Basophils Relative: 1 %
Eosinophils Absolute: 0.3 10*3/uL (ref 0.0–0.5)
Eosinophils Relative: 4 %
HCT: 34.7 % — ABNORMAL LOW (ref 36.0–46.0)
Hemoglobin: 11.3 g/dL — ABNORMAL LOW (ref 12.0–15.0)
Immature Granulocytes: 0 %
Lymphocytes Relative: 42 %
Lymphs Abs: 3.1 10*3/uL (ref 0.7–4.0)
MCH: 27.7 pg (ref 26.0–34.0)
MCHC: 32.6 g/dL (ref 30.0–36.0)
MCV: 85 fL (ref 80.0–100.0)
Monocytes Absolute: 0.5 10*3/uL (ref 0.1–1.0)
Monocytes Relative: 7 %
Neutro Abs: 3.4 10*3/uL (ref 1.7–7.7)
Neutrophils Relative %: 46 %
Platelets: 272 10*3/uL (ref 150–400)
RBC: 4.08 MIL/uL (ref 3.87–5.11)
RDW: 13.5 % (ref 11.5–15.5)
WBC: 7.3 10*3/uL (ref 4.0–10.5)
nRBC: 0 % (ref 0.0–0.2)

## 2021-06-29 LAB — I-STAT BETA HCG BLOOD, ED (MC, WL, AP ONLY): I-stat hCG, quantitative: <5 m[IU]/mL (ref <5)

## 2021-06-29 LAB — LIPASE, BLOOD: Lipase: 22 U/L (ref 11–51)

## 2021-06-29 IMAGING — CT CT ABD-PELV W/ CM
2 of 3 series · 16 of 46 positions shown, 18 images · IV contrast (agent unspecified)
Comparison: [DATE]

CLINICAL DATA: 35-year-old female with acute LEFT abdominal and
pelvic pain. History of abdominal wall hernia and repair.

EXAM:
CT ABDOMEN AND PELVIS WITH CONTRAST
TECHNIQUE: Multidetector CT imaging of the abdomen and pelvis was performed
using the standard protocol following bolus administration of
intravenous contrast.

[Series 3: abd/ pelvis 5.0 i30f 2 · axial · 0.98mm/px · z∈[-466,-26]mm · 13 of 102 slices shown, 15 images]
[im 7/102  soft-tissue]
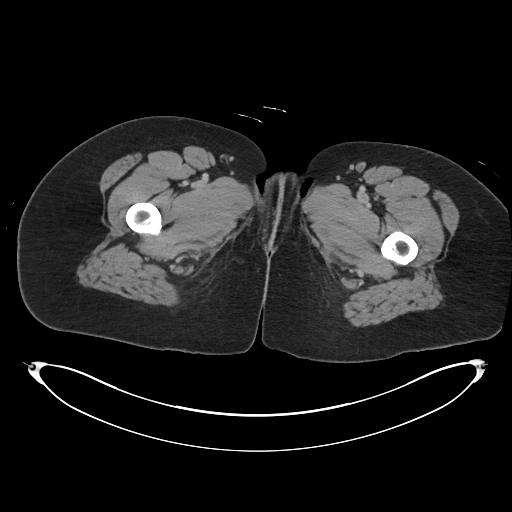
[im 7/102  bone]
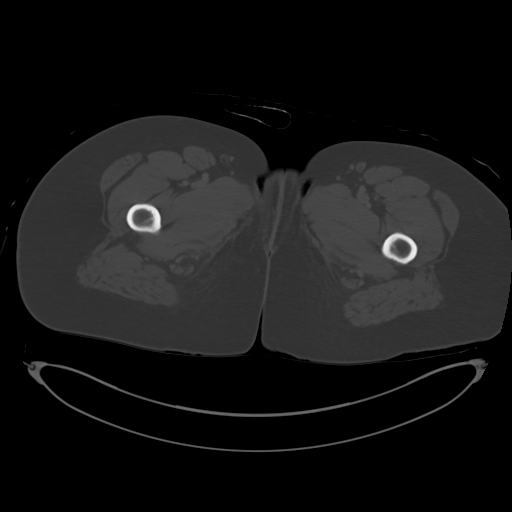
[im 14/102  soft-tissue]
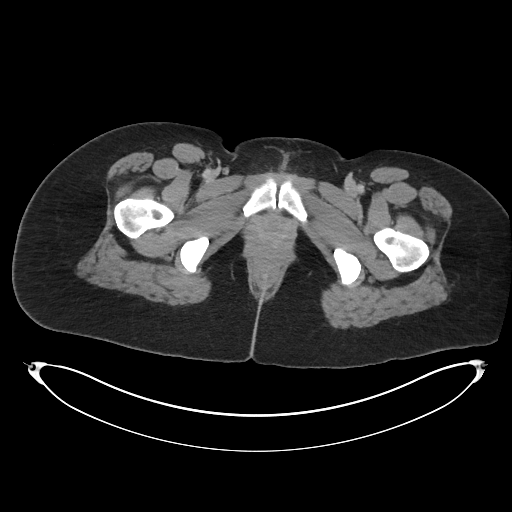
[im 20/102  soft-tissue]
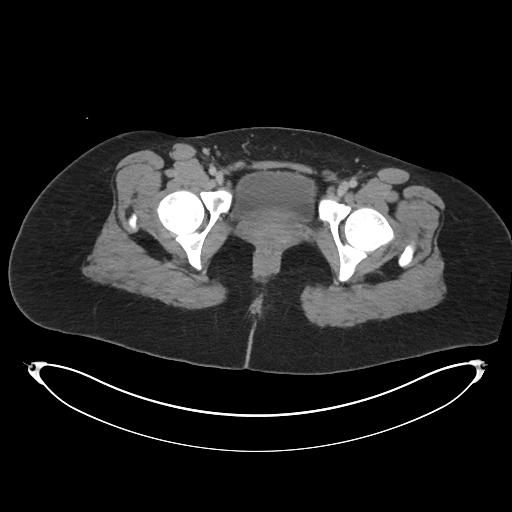
[im 30/102  soft-tissue]
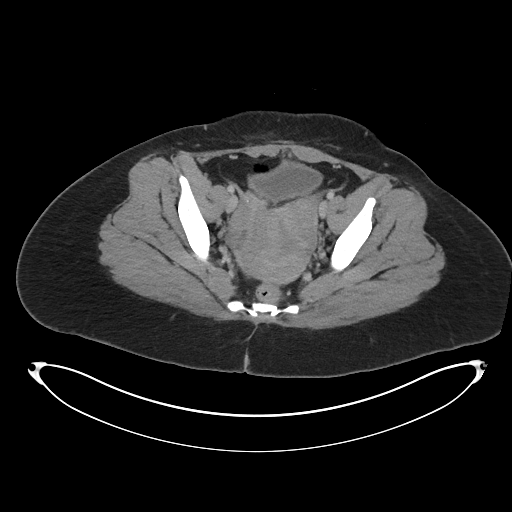
[im 36/102  soft-tissue]
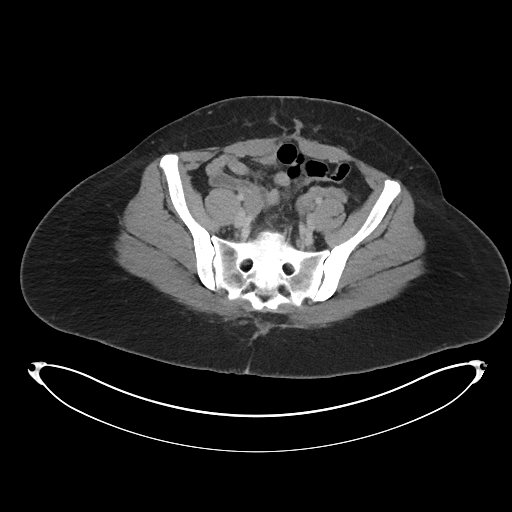
[im 43/102  soft-tissue]
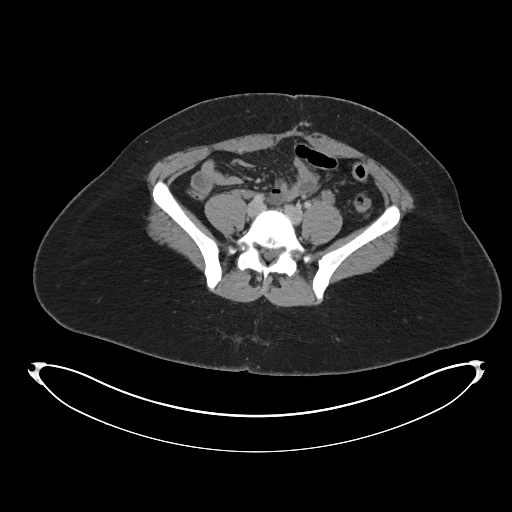
[im 53/102  soft-tissue]
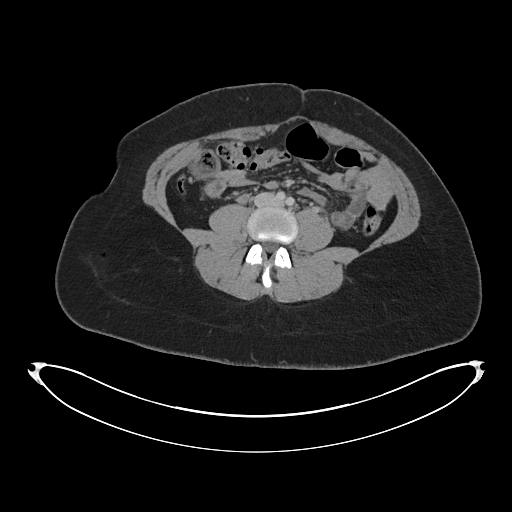
[im 59/102  soft-tissue]
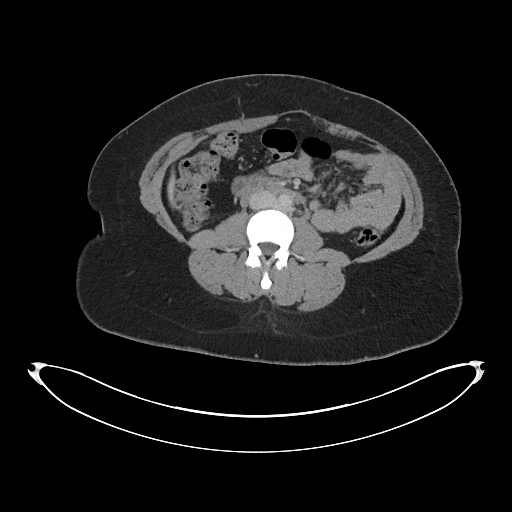
[im 66/102  soft-tissue]
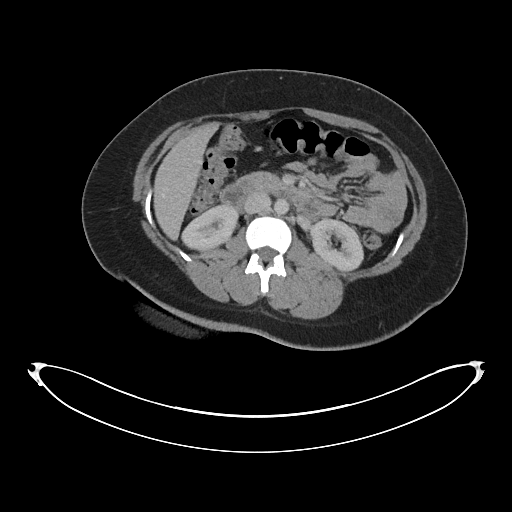
[im 66/102  bone]
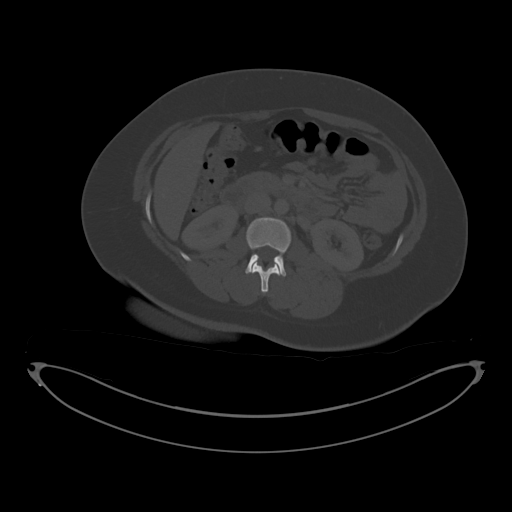
[im 72/102  soft-tissue]
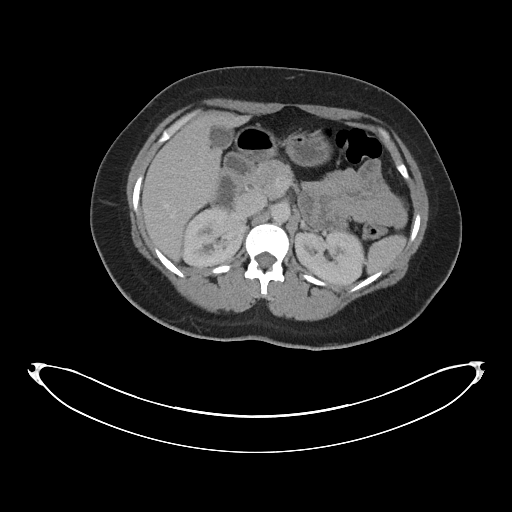
[im 82/102  soft-tissue]
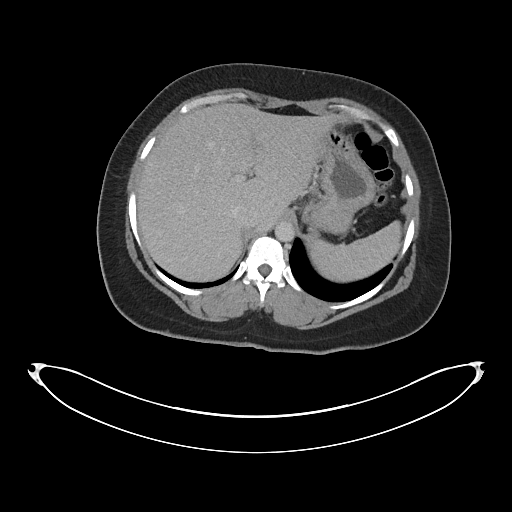
[im 88/102  soft-tissue]
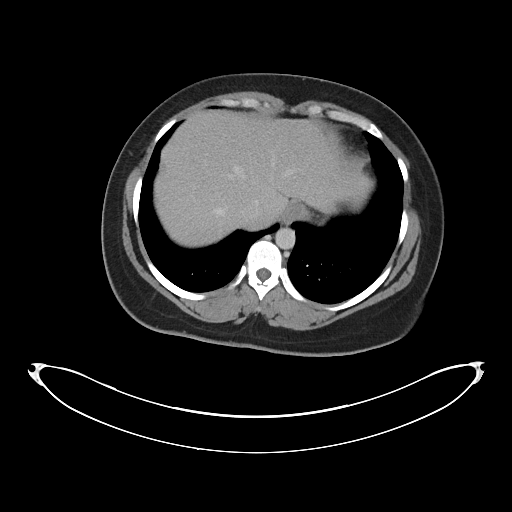
[im 95/102  soft-tissue]
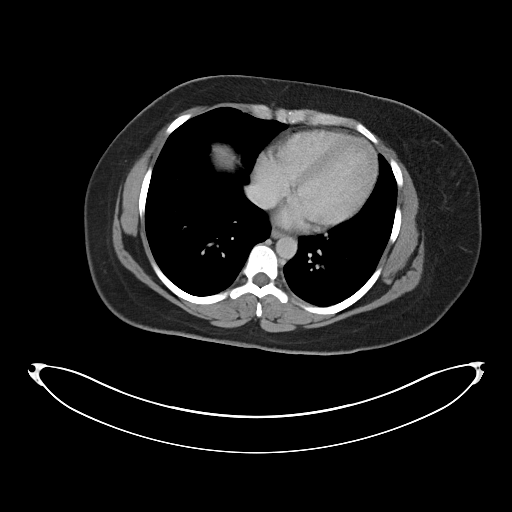

[Series 6: coronal soft tissue · coronal · 0.97mm/px · 3 of 113 slices shown]
[im 38/113  soft-tissue]
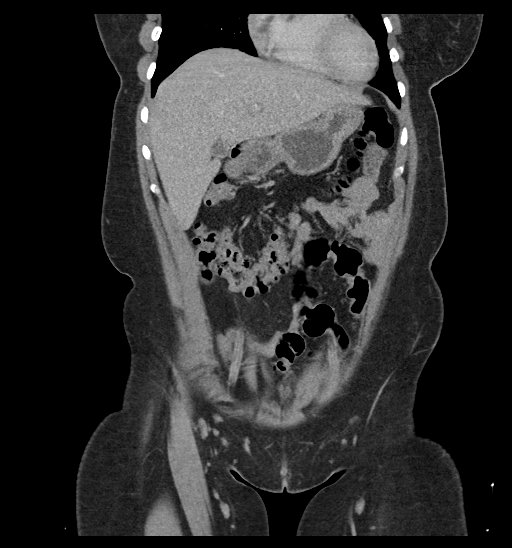
[im 50/113  soft-tissue]
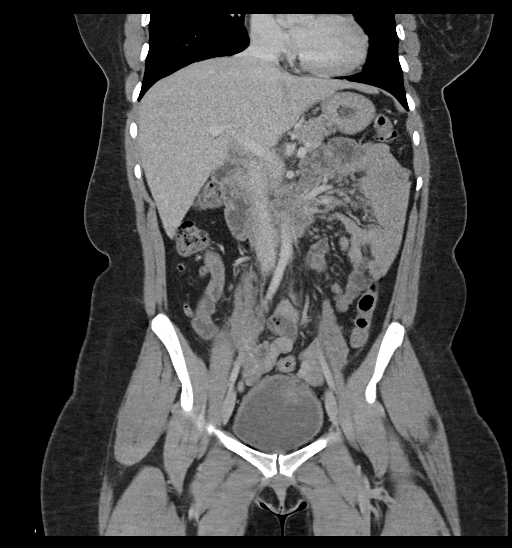
[im 63/113  soft-tissue]
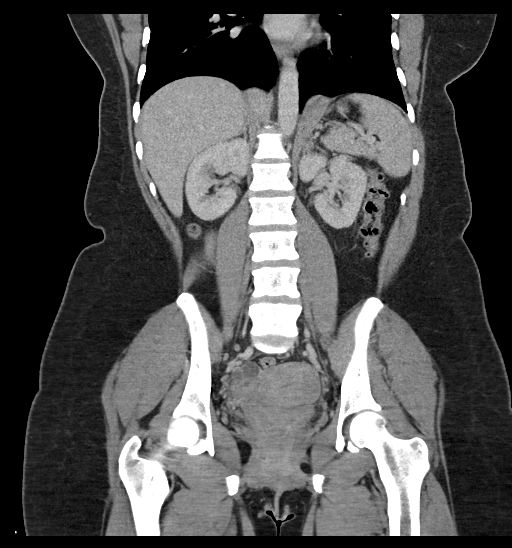

[16 of 46 positions shown; findings below may reference images not displayed]

RADIATION DOSE REDUCTION: This exam was performed according to the
departmental dose-optimization program which includes automated
exposure control, adjustment of the mA and/or kV according to
patient size and/or use of iterative reconstruction technique.

CONTRAST:  100mL OMNIPAQUE IOHEXOL 300 MG/ML  SOLN
FINDINGS: Lower chest: No acute abnormality

Hepatobiliary: The liver and gallbladder are unremarkable. There is
no evidence of intrahepatic or extrahepatic biliary dilatation.

Pancreas: Unremarkable

Spleen: Unremarkable

Adrenals/Urinary Tract: The kidneys, adrenal glands and bladder are
unremarkable.

Stomach/Bowel: Stomach is within normal limits. Appendix appears
normal. No evidence of bowel wall thickening, distention, or
inflammatory changes. Colonic diverticulosis identified without
evidence of acute diverticulitis.

Vascular/Lymphatic: No significant vascular findings are present. No
enlarged abdominal or pelvic lymph nodes.

Reproductive: Heterogeneous uterus again noted. No suspicious
adnexal abnormalities noted.

Other: There is no evidence of recurrent abdominal wall hernia.
Subcutaneous changes from prior abdominal wall surgery noted. No
ascites, focal collection or pneumoperitoneum. A tiny amount of gas
in the RIGHT gluteal subcutaneous tissues likely represents
injection sites.

Musculoskeletal: No acute or suspicious bony abnormalities are
noted.
IMPRESSION: 1. No evidence of acute abnormality.
2. No evidence of recurrent abdominal wall hernia.
3. Heterogeneous uterus again noted.

## 2021-06-29 MED ORDER — ONDANSETRON 4 MG PO TBDP
4.0000 mg | ORAL_TABLET | Freq: Once | ORAL | Status: AC
  Administered 2021-06-29: 4 mg via ORAL
  Filled 2021-06-29: qty 1

## 2021-06-29 MED ORDER — IOHEXOL 300 MG/ML  SOLN
100.0000 mL | Freq: Once | INTRAMUSCULAR | Status: AC | PRN
  Administered 2021-06-29: 100 mL via INTRAVENOUS

## 2021-06-29 MED ORDER — ONDANSETRON 4 MG PO TBDP: 4.0000 mg | ORAL_TABLET | ORAL | 0 refills | Status: DC | PRN

## 2021-06-29 MED ORDER — TRAMADOL HCL 50 MG PO TABS: ORAL_TABLET | ORAL | 0 refills | Status: DC

## 2021-06-29 MED ORDER — HYDROMORPHONE HCL 1 MG/ML IJ SOLN
1.0000 mg | Freq: Once | INTRAMUSCULAR | Status: AC
  Administered 2021-06-29: 1 mg via INTRAVENOUS
  Filled 2021-06-29: qty 1

## 2021-06-29 MED ORDER — MORPHINE SULFATE (PF) 2 MG/ML IV SOLN
2.0000 mg | Freq: Once | INTRAVENOUS | Status: AC
  Administered 2021-06-29: 2 mg via INTRAMUSCULAR
  Filled 2021-06-29: qty 1

## 2021-06-29 MED ORDER — ONDANSETRON HCL 4 MG/2ML IJ SOLN
4.0000 mg | Freq: Once | INTRAMUSCULAR | Status: AC
  Administered 2021-06-29: 4 mg via INTRAVENOUS
  Filled 2021-06-29: qty 2

## 2021-06-29 NOTE — Discharge Instructions (Signed)
1.  You may take 1-2 tramadol tablets for pain.  Take a dose of extra strength Tylenol every 6 hours with the tramadol.  May apply cool ice packs to your abdomen if you are having pain.  Continue to watch for any changes.  Return if there is redness and swelling or drainage around the tender area in your lower abdomen.  Return if you start getting something that looks like small water blisters developing on your skin.  It could be is shingles.  Sometimes shingles pain starts before you actually see the rash. ?2.  To follow-up with your family doctor but if you cannot be seen, return to the emergency department for recheck within 2 to 3 days ?

## 2021-06-29 NOTE — ED Provider Notes (Signed)
?[facility] ?Provider Note ? ? ?CSN: [id number] ?Arrival date & time: 06/29/21  1113 ? ?  ? ?History ? ?Chief Complaint  ?Patient presents with  ? Abdominal Pain  ? Back Pain  ? Hernia  ? Constipation  ? ? ?Leah Shea is a 36 y.o. female. ? ?HPI ?Patient reports has been having pain in her left lower abdomen and inguinal area for about 6 days.  Reports pain is severe.  It wraps around from her midline under the umbilicus left to about her lateral flank.  Does not go past the hip.  Patient reports that she has a knot in the soft tissues and it is very tender.  She reports her pain is severe.  Been constant.  Patient reports she has been constipated.  Reports she vomited twice today.  She denies abnormal vaginal discharge or bleeding.  Patient was sent from urgent care for further evaluation. ?  ? ?Home Medications ?Prior to Admission medications   ?Medication Sig Start Date End Date Taking? Authorizing Provider  ?acetaminophen (TYLENOL) 500 MG tablet Take 1 tablet (500 mg total) by mouth every 6 (six) hours as needed. 11/12/20   Constant, Peggy, MD  ?escitalopram (LEXAPRO) 20 MG tablet TAKE ONE TABLET BY MOUTH ONCE A DAY FOR DEPRESSION AND ANXIETY 11/10/20   [provider]  ?hyoscyamine (ANASPAZ) 0.125 MG TBDP disintergrating tablet Place 1 tablet (0.125 mg total) under the tongue every 4 (four) hours as needed. 05/05/21   Jenel Lucksunningham, Scott E, MD  ?LORazepam (ATIVAN) 0.5 MG tablet Take 1 tablet (0.5 mg total) by mouth every 6 (six) hours as needed for anxiety or sleep. 10/02/20   Milas Hockuncan, Paula, MD  ?omeprazole (PRILOSEC) 20 MG capsule Take 1 capsule (20 mg total) by mouth daily. 05/05/21   Jenel Lucksunningham, Scott E, MD  ?   ? ?Allergies    ?Mushroom extract complex, Other, Nsaids, Prenatal vitamins, Reglan [metoclopramide], and Sulfa antibiotics   ? ?Review of Systems   ?Review of Systems ?10 systems reviewed negative except as per HPI ?Physical Exam ?Updated Vital  Signs ?BP (!) 150/83   Pulse 91   Temp 98.4 ?F (36.9 ?C) (Oral)   Resp 18   Ht 5' 0.5" (1.537 m)   Wt 99.8 kg   LMP 06/18/2021 (Exact Date)   SpO2 99%   BMI 42.26 kg/m?  ?Physical Exam ?Constitutional:   ?   Comments: Patient is alert and nontoxic.  She is clinically well in appearance.  She is very tearful.  No respiratory distress.  ?HENT:  ?   Mouth/Throat:  ?   Pharynx: Oropharynx is clear.  ?Eyes:  ?   Extraocular Movements: Extraocular movements intact.  ?Cardiovascular:  ?   Rate and Rhythm: Normal rate and regular rhythm.  ?Pulmonary:  ?   Effort: Pulmonary effort is normal.  ?   Breath sounds: Normal breath sounds.  ?Abdominal:  ?   Comments: Patient is abdomen is normal to visual inspection.  There are no areas of swelling or erythema to the soft tissues.  Patient endorses exquisite tenderness in the soft tissues below the umbilicus.  Also wincing and endorsing severe pain over a small scar at the end of a stretch mark.  To palpation there are no areas of induration or abscess.  The soft tissues feel normal to touch.  Patient does have disproportionate amount of pain throughout the soft tissues all the way from the low abdomen in the midline to around the lateral  aspect about the posterior axillary line just above the iliac crest.  See attached images  ?Musculoskeletal:     ?   General: No swelling. Normal range of motion.  ?   Right lower leg: No edema.  ?   Left lower leg: No edema.  ?Skin: ?   General: Skin is warm and dry.  ?Neurological:  ?   General: No focal deficit present.  ?   Mental Status: She is oriented to person, place, and time.  ?   Motor: No weakness.  ?   Coordination: Coordination normal.  ?Psychiatric:  ?   Comments: Patient is very tearful and anxious but appropriately interactive.  ? ? ? ? ? ?ED Results / Procedures / Treatments   ?Labs ?(all labs ordered are listed, but only abnormal results are displayed) ?Labs Reviewed  ?CBC WITH DIFFERENTIAL/PLATELET - Abnormal; Notable  for the following components:  ?    Result Value  ? Hemoglobin 11.3 (*)   ? HCT 34.7 (*)   ? All other components within normal limits  ?COMPREHENSIVE METABOLIC PANEL - Abnormal; Notable for the following components:  ? Glucose, Bld 102 (*)   ? AST 13 (*)   ? Total Bilirubin 0.1 (*)   ? All other components within normal limits  ?URINALYSIS, ROUTINE W REFLEX MICROSCOPIC - Abnormal; Notable for the following components:  ? APPearance HAZY (*)   ? All other components within normal limits  ?LIPASE, BLOOD  ?I-STAT BETA HCG BLOOD, ED ([facility], [facility], [facility] ONLY)  ? ? ?EKG ?None ? ?Radiology ?CT Abdomen Pelvis W Contrast ? ?Result Date: 06/29/2021 ?CLINICAL DATA:  36 year old female with acute LEFT abdominal and pelvic pain. History of abdominal wall hernia and repair. EXAM: CT ABDOMEN AND PELVIS WITH CONTRAST TECHNIQUE: Multidetector CT imaging of the abdomen and pelvis was performed using the standard protocol following bolus administration of intravenous contrast. RADIATION DOSE REDUCTION: This exam was performed according to the departmental dose-optimization program which includes automated exposure control, adjustment of the mA and/or kV according to patient size and/or use of iterative reconstruction technique. CONTRAST:  OMNIPAQUE IOHEXOL 300 MG/ML  SOLN COMPARISON:  12/10/2020 FINDINGS: Lower chest: No acute abnormality Hepatobiliary: The liver and gallbladder are unremarkable. There is no evidence of intrahepatic or extrahepatic biliary dilatation. Pancreas: Unremarkable Spleen: Unremarkable Adrenals/Urinary Tract: The kidneys, adrenal glands and bladder are unremarkable. Stomach/Bowel: Stomach is within normal limits. Appendix appears normal. No evidence of bowel wall thickening, distention, or inflammatory changes. Colonic diverticulosis identified without evidence of acute diverticulitis. Vascular/Lymphatic: No significant vascular findings are present. No enlarged abdominal or pelvic lymph nodes. Reproductive:  Heterogeneous uterus again noted. No suspicious adnexal abnormalities noted. Other: There is no evidence of recurrent abdominal wall hernia. Subcutaneous changes from prior abdominal wall surgery noted. No ascites, focal collection or pneumoperitoneum. A tiny amount of gas in the RIGHT gluteal subcutaneous tissues likely represents injection sites. Musculoskeletal: No acute or suspicious bony abnormalities are noted. IMPRESSION: 1. No evidence of acute abnormality. 2. No evidence of recurrent abdominal wall hernia. 3. Heterogeneous uterus again noted. Electronically Signed   By: Harmon Pier M.D.   On: 06/29/2021 17:27   ? ?Procedures ?Procedures  ? ? ?Medications Ordered in ED ?Medications  ?HYDROmorphone (DILAUDID) injection 1 mg (has no administration in time range)  ?ondansetron (ZOFRAN-ODT) disintegrating tablet 4 mg (4 mg Oral Given 06/29/21 1438)  ?morphine (PF) 2 MG/ML injection 2 mg (2 mg Intramuscular Given 06/29/21 1439)  ?iohexol (OMNIPAQUE) 300 MG/ML solution 100  mL (100 mLs Intravenous Contrast Given 06/29/21 1717)  ? ? ?ED Course/ Medical Decision Making/ A&P ?  ?                        ?Medical Decision Making ?Risk ?Prescription drug management. ? ? ?Patient describes 6 days of pain.  On physical exam patient is exquisitely tender to even light touch of the soft tissues over the lower abdomen.  Clinically there is no apparent soft tissue abnormality such as abscess or cellulitis.  Patient is very concerned about a small nodule at the end of a stretch mark.  At this point I cannot make any case for an active abscess.  It appears there might of been a distant abscess that drained and is subsequently healed with scarring.  Plan will be to watch this area but right now it does not appear appear appropriate for antibiotics or any kind of interventional treatment. ? ?CT scan does not show any recurrence of hernia.  No other intra-abdominal abnormalities.  The patient's pain seems to be concentrated more in  the soft tissues and deeper abdomen, I question possibly nerve pain from prior surgical incision around the umbilicus.  Patient is expressing severe pain, I will treat with IV pain medication.  Lan to discharge with sho

## 2021-06-29 NOTE — ED Provider Triage Note (Signed)
Emergency Medicine Provider Triage Evaluation Note ? ?Leah Shea , a 36 y.o. female  was evaluated in triage.  Pt complains of abdominal pain and constipation for the last 6 days.  Hx of unknown hernia type of the left lower quadrant.  Accompanied with N/V, fever, chills.  Has had 1 bowel movement in the last week that was small and described like "sludge".  Denies shortness of breath, chest pain, dizziness, lightheadedness. ? ?Review of Systems  ?Positive: As above ?Negative: As above ? ?Physical Exam  ?BP (!) 137/101 (BP Location: Left Arm)   Pulse 74   Temp 98.4 ?F (36.9 ?C) (Oral)   Resp 18   Ht 5' 0.5" (1.537 m)   Wt 99.8 kg   LMP 06/18/2021 (Exact Date)   SpO2 100%   BMI 42.26 kg/m?  ?Gen:   Awake, no distress, ill-appearing ?Resp:  Normal effort, CTAB ?MSK:   Moves extremities without difficulty  ?Other:  Obvious mild-moderate mass in the left lower quadrant region extending towards the umbilicus.  Significant TTP of this area.  RRR without M/R/G.  Afebrile. ? ?Medical Decision Making  ?Medically screening exam initiated at 2:20 PM.  Appropriate orders placed.  Leah Shea was informed that the remainder of the evaluation will be completed by another provider, this initial triage assessment does not replace that evaluation, and the importance of remaining in the ED until their evaluation is complete. ? ?Labs, imaging ordered ? ?ODT Zofran and IM morphine ordered ?  ?Prince Rome, PA-C ?[id number] ? ?
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
## 2021-06-29 NOTE — Discharge Instructions (Signed)
-  I am concerned you have something called an obstructed hernia, which can cause a bowel obstruction. This needs prompt abdominal imaging including CT scan and possible surgical intervention, which must be performed in the ED. Please head straight there.  ?

## 2021-06-29 NOTE — ED Triage Notes (Signed)
Pt. Stated, Leah Shea been to UC and they sent me here for stomach pain and I have umbilical hernia and a knot on LLQ area that started last Thursday. I was sent for further studies. ?

## 2021-06-29 NOTE — ED Triage Notes (Signed)
Pt reports ABD pain and abscess started on Thursday. Pt reports ABD pain around umbilical area. Abscess on Lt side of ABD. ?

## 2021-06-29 NOTE — ED Notes (Signed)
Discharge instructions reviewed with patient. Patient verbalized understanding of instructions. Follow-up care and medications were reviewed. Patient ambulatory with steady gait. VSS upon discharge.  ?

## 2021-06-29 NOTE — Discharge Planning (Signed)
RNCM contacted [employer] to obtain collaterals surrounding medical coverage. ?

## 2021-07-07 ENCOUNTER — Ambulatory Visit (INDEPENDENT_AMBULATORY_CARE_PROVIDER_SITE_OTHER): Payer: Medicaid Other | Admitting: Gastroenterology

## 2021-07-07 ENCOUNTER — Encounter: Payer: Self-pay | Admitting: Gastroenterology

## 2021-07-07 VITALS — BP 112/70 | HR 79 | Ht 66.5 in | Wt 220.2 lb

## 2021-07-07 DIAGNOSIS — R112 Nausea with vomiting, unspecified: Secondary | ICD-10-CM | POA: Diagnosis not present

## 2021-07-07 DIAGNOSIS — K589 Irritable bowel syndrome without diarrhea: Secondary | ICD-10-CM | POA: Diagnosis not present

## 2021-07-07 MED ORDER — PROMETHAZINE HCL 25 MG PO TABS
25.0000 mg | ORAL_TABLET | Freq: Three times a day (TID) | ORAL | 1 refills | Status: DC | PRN
Start: 2021-07-07 — End: 2021-09-22

## 2021-07-07 NOTE — Patient Instructions (Addendum)
If you are age 36 or older, your body mass index should be between 23-30. Your Body mass index is 35.01 kg/m. If this is out of the aforementioned range listed, please consider follow up with your Primary Care Provider.  If you are age 37 or younger, your body mass index should be between 19-25. Your Body mass index is 35.01 kg/m. If this is out of the aformentioned range listed, please consider follow up with your Primary Care Provider.   We have sent the following medications to your pharmacy for you to pick up at your convenience: Phenergan 25 mg every 8 hours as needed.  We will send a referral to [facility] for further management of IBS   The [facility] providers would like to encourage you to use [facility] to communicate with providers for non-urgent requests or questions.  Due to long hold times on the telephone, sending your provider a message by [facility] may be a faster and more efficient way to get a response.  Please allow 48 business hours for a response.  Please remember that this is for non-urgent requests.   It was a pleasure to see you today!  Thank you for trusting me with your gastrointestinal care!    Scott E.Tomasa Rand, MD

## 2021-07-07 NOTE — Progress Notes (Signed)
HPI : Leah Shea is a pleasant 36 year old female with IBS, PTSD, anxiety/depression and seizure disorder who presents for follow up of severe IBS symptoms.  The patient was initially seen by me in clinic March 22th.  She was having symptoms of upper abdominal pain, nausea and vomiting as well as lower abdominal pain and irregular bowel habits with reported black tarry stools.  She underwent an EGD on March 24th which showed a 5 cm hiatal hernia, but was otherwise normal.  Review of the EMR indicates that she had a SAB April 6th. She presented to the ED on May 16th with severe abdominal pain as well as nausea/vomiting. She was noted to have exquisite tenderness to palpation in the lower abdomen.  A CT was obtained which was unremarkable.  CBC, CMP, UA unremarkable.  She was discharged home with pain medications. Today, she reports continued severe abdominal pain and nausea/vomiting.  Vomiting has been both on an empty stomach and sometimes after she eats.  She is able to keep fluids down.  Her nausea comes and goes but has been constant today.  The abdominal pain has also been constant, located in the bilateral lower quadrants, and is described as a sharp stabbing pain, 'like a contraction'.  She has been passing small stools on a daily basis, but had gone about a week without a bowel movement..  When asked how things were going at home, the patient reported that things were good, but does report that her husband moved back in with her April 30th because of her frequent seizures (last one was yesterday) to help take care of their 5 children.  Patient states that her seizures and PTSD were what caused them to separate.   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  Gastric bx with reactive gastropathy, no H. pylori  Past Surgical History:  Procedure Laterality Date   ABDOMINAL HERNIA REPAIR  10/08/2020   BREAST SURGERY Bilateral    reductions   CESAREAN SECTION  2010, [id number]   x 3   CESAREAN SECTION N/A 09/28/2020   Procedure: CESAREAN SECTION;  Surgeon: Venora MaplesEckstat, Matthew M, MD;  Location: [facility]S;  Service: Obstetrics;  Laterality: N/A;   EYE SURGERY     x 10 as a child   LAPAROTOMY N/A 10/08/2020   Procedure: EXPLORATORY LAPAROTOMY WITH CLOSURE ABDOMINAL WALL DEFECT POSSIBLE BOWEL RESECTION;  Surgeon: Harriette Bouillonornett, Thomas, MD;  Location: [facility] OR;  Service: General;  Laterality: N/A;   PILONIDAL CYST EXCISION  2020   TONSILLECTOMY N/A 10/07/2013   Procedure: TONSILLECTOMY;  Surgeon: Christia Readingwight Bates, MD;  [facility];  Service: ENT;  Laterality: N/A;   TUMOR REMOVAL     tumor  removed from back   Family History  Problem Relation Age of Onset   Breast cancer Mother    Clotting disorder Mother    Diabetes Mother    Heart disease Mother    Breast cancer Maternal Grandmother    Diabetes Maternal Grandmother    Prostate cancer Maternal Grandfather        metastatic   Pancreatic cancer Maternal Grandfather    Colon cancer Maternal Grandfather    Diabetes Maternal Grandfather    Breast cancer Maternal Aunt 36       metastatic   Cancer Maternal Aunt        either breast or ovarian    Cancer Maternal Uncle        unk type   Colon polyps Neg Hx    Stomach cancer Neg Hx    Esophageal cancer Neg Hx    Social History   Tobacco Use   Smoking status:  Former    Packs/day: 0.25    Years: 1.00    Pack years: 0.25    Types: Cigarettes    Quit date: 07/02/2013    Years since quitting: 8.0   Smokeless tobacco: Never  Vaping Use   Vaping Use: Never used  Substance Use Topics   Alcohol use: Not Currently   Drug use: Not Currently    Types: Marijuana    Comment: last used January 3   Current Outpatient Medications  Medication Sig Dispense Refill   lamoTRIgine (LAMICTAL) 200 MG tablet TAKE ONE-HALF TABLET BY MOUTH TWICE A DAY FOR 3 WEEKS, THEN TAKE ONE-HALF TABLET ONCE A DAY FOR 2 WEEKS, AND TAKE ONE TABLET AT BEDTIME FOR 2 WEEKS, THEN TAKE ONE TABLET TWICE A DAY FOR SEIZURE     levETIRAcetam (KEPPRA) 500 MG tablet TAKE ONE-HALF TABLET BY MOUTH TWICE A DAY FOR 2 WEEKS, THEN TAKE ONE TABLET TWICE A DAY FOR 2 WEEKS, THEN TAKE ONE AND ONE-HALF TABLETS TWICE A DAY FOR 2 WEEKS, THEN TAKE TWO TABLETS TWICE A DAY FOR SEIZURES     SUMAtriptan (IMITREX) 6 MG/0.5ML SOSY injection INJECT 6MG  (ONE SINGLE DOSE AUTOINJECTOR) SUBCUTANEOUSLY TWICE A DAY AS NEEDED FOR MIGRAINE     acetaminophen (TYLENOL) 500 MG tablet Take 1 tablet (500 mg total) by mouth every 6 (six) hours as needed. 30 tablet 0   escitalopram (LEXAPRO) 20 MG tablet TAKE ONE TABLET BY MOUTH ONCE A DAY FOR DEPRESSION AND ANXIETY     hyoscyamine (ANASPAZ) 0.125 MG TBDP disintergrating tablet Place 1 tablet (0.125 mg total) under the tongue every 4 (four) hours as needed. 60 tablet 1   LORazepam (ATIVAN) 0.5 MG tablet Take 1 tablet (0.5 mg total) by mouth every 6 (six) hours as needed for anxiety or sleep. 30 tablet 0   omeprazole (PRILOSEC) 20 MG capsule Take 1 capsule (20 mg total) by mouth daily. 60 capsule 1   ondansetron (ZOFRAN-ODT) 4 MG disintegrating tablet Take 1 tablet (4 mg total) by mouth every 4 (four) hours as needed for nausea or vomiting. 20 tablet 0   traMADol (ULTRAM) 50 MG tablet 1-2 tablets every 6 hours as needed for pain 20 tablet 0   No current facility-administered  medications for this visit.   Allergies  Allergen Reactions   Mushroom Extract Complex Anaphylaxis and Hives   Other Anaphylaxis and Hives    Mushrooms   Nsaids Hives, Swelling and Other (See Comments)    "Body burns" Tolerated celecoxib 09/2020    Prenatal Vitamins    Reglan [  Metoclopramide] Anxiety   Sulfa Antibiotics Other (See Comments)     Review of Systems: All systems reviewed and negative except where noted in HPI.    CT Abdomen Pelvis W Contrast  Result Date: 06/29/2021 CLINICAL DATA:  36 year old female with acute LEFT abdominal and pelvic pain. History of abdominal wall hernia and repair. EXAM: CT ABDOMEN AND PELVIS WITH CONTRAST TECHNIQUE: Multidetector CT imaging of the abdomen and pelvis was performed using the standard protocol following bolus administration of intravenous contrast. RADIATION DOSE REDUCTION: This exam was performed according to the departmental dose-optimization program which includes automated exposure control, adjustment of the mA and/or kV according to patient size and/or use of iterative reconstruction technique. CONTRAST:  OMNIPAQUE IOHEXOL 300 MG/ML  SOLN COMPARISON:  12/10/2020 FINDINGS: Lower chest: No acute abnormality Hepatobiliary: The liver and gallbladder are unremarkable. There is no evidence of intrahepatic or extrahepatic biliary dilatation. Pancreas: Unremarkable Spleen: Unremarkable Adrenals/Urinary Tract: The kidneys, adrenal glands and bladder are unremarkable. Stomach/Bowel: Stomach is within normal limits. Appendix appears normal. No evidence of bowel wall thickening, distention, or inflammatory changes. Colonic diverticulosis identified without evidence of acute diverticulitis. Vascular/Lymphatic: No significant vascular findings are present. No enlarged abdominal or pelvic lymph nodes. Reproductive: Heterogeneous uterus again noted. No suspicious adnexal abnormalities noted. Other: There is no evidence of recurrent abdominal wall  hernia. Subcutaneous changes from prior abdominal wall surgery noted. No ascites, focal collection or pneumoperitoneum. A tiny amount of gas in the RIGHT gluteal subcutaneous tissues likely represents injection sites. Musculoskeletal: No acute or suspicious bony abnormalities are noted. IMPRESSION: 1. No evidence of acute abnormality. 2. No evidence of recurrent abdominal wall hernia. 3. Heterogeneous uterus again noted. Electronically Signed   By: Harmon Pier M.D.   On: 06/29/2021 17:27    Physical Exam: BP 112/70   Pulse 79   Ht 5' 6.5" (1.689 m)   Wt 220 lb 3.2 oz (99.9 kg)   LMP 06/18/2021 (Exact Date)   SpO2 99%   BMI 35.01 kg/m  Constitutional: Pleasant,well-developed, African American female appears uncomfortable, has emesis bag HEENT: Normocephalic and atraumatic. Conjunctivae are normal. No scleral icterus. Neck supple.  Cardiovascular: Normal rate, regular rhythm.  Pulmonary/chest: Effort normal and breath sounds normal. No wheezing, rales or rhonchi. Abdominal: Soft, nondistended, diffuse tenderness to very light palpation throughout the entire abdomen, no guarding or rigidity. Bowel sounds active throughout. There are no masses palpable. No hepatomegaly. Extremities: no edema Neurological: Alert and oriented to person place and time. Skin: Skin is warm and dry. No rashes noted. Psychiatric: Normal mood and affect. Behavior is normal.  CBC    Component Value Date/Time   WBC 7.3 06/29/2021 1431   RBC 4.08 06/29/2021 1431   HGB 11.3 (L) 06/29/2021 1431   HGB 10.8 (L) 08/25/2020 1017   HCT 34.7 (L) 06/29/2021 1431   HCT 32.8 (L) 08/25/2020 1017   PLT 272 06/29/2021 1431   PLT 182 08/25/2020 1017   MCV 85.0 06/29/2021 1431   MCV 91 08/25/2020 1017   MCH 27.7 06/29/2021 1431   MCHC 32.6 06/29/2021 1431   RDW 13.5 06/29/2021 1431   RDW 12.7 08/25/2020 1017   LYMPHSABS 3.1 06/29/2021 1431   LYMPHSABS 2.3 04/14/2020 1100   MONOABS 0.5 06/29/2021 1431   EOSABS 0.3  06/29/2021 1431   EOSABS 0.1 04/14/2020 1100   BASOSABS 0.1 06/29/2021 1431   BASOSABS 0.1 04/14/2020 1100    CMP     Component Value Date/Time   NA 139 06/29/2021 1431  K 4.0 06/29/2021 1431   CL 110 06/29/2021 1431   CO2 22 06/29/2021 1431   GLUCOSE 102 (H) 06/29/2021 1431   BUN 11 06/29/2021 1431   CREATININE 0.86 06/29/2021 1431   CALCIUM 9.5 06/29/2021 1431   PROT 7.3 06/29/2021 1431   ALBUMIN 3.7 06/29/2021 1431   AST 13 (L) 06/29/2021 1431   ALT 14 06/29/2021 1431   ALKPHOS 82 06/29/2021 1431   BILITOT 0.1 (L) 06/29/2021 1431   GFRNONAA >60 06/29/2021 1431   GFRAA >60 02/08/2019 1418     ASSESSMENT AND PLAN: 36 year old female with IBS, PTSD, anxiety/depression with ongoing severe symptoms of abdominal pain, nausea and vomiting, with normal CT and labs.  Her weight is stable.  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  Recent triggers for her exacerbation of symptoms may include her recent SAB and change in living situation (husband returning home after separation). She is already on multiple centrally acting agents.  I think that based on the severity of her symptoms and complexity of her case, she would benefit from evaluation and treatment at a referral center where they have special GI psychologists. Will prescribe Phenergan today and place referral to [facility] for further management of IBS and overlapping gut brain axis disorders  IBS, nausea/vomiting - Phenergan 25 mg PO q6 hrs PRN - Referral to [facility] E. Tomasa Rand, MD [facility]enter[facility]    Administration, [facility]

## 2021-08-30 ENCOUNTER — Inpatient Hospital Stay (HOSPITAL_COMMUNITY)
Admission: AD | Admit: 2021-08-30 | Discharge: 2021-08-30 | Disposition: A | Discharge status: Home / Self Care | Payer: Medicaid Other | Attending: Obstetrics and Gynecology | Admitting: Obstetrics and Gynecology

## 2021-08-30 ENCOUNTER — Inpatient Hospital Stay (HOSPITAL_COMMUNITY): Payer: Medicaid Other

## 2021-08-30 ENCOUNTER — Encounter (HOSPITAL_COMMUNITY): Payer: Self-pay | Admitting: *Deleted

## 2021-08-30 DIAGNOSIS — Z3A10 10 weeks gestation of pregnancy: Secondary | ICD-10-CM | POA: Insufficient documentation

## 2021-08-30 DIAGNOSIS — O09521 Supervision of elderly multigravida, first trimester: Secondary | ICD-10-CM | POA: Insufficient documentation

## 2021-08-30 DIAGNOSIS — O26891 Other specified pregnancy related conditions, first trimester: Secondary | ICD-10-CM | POA: Diagnosis not present

## 2021-08-30 DIAGNOSIS — R109 Unspecified abdominal pain: Secondary | ICD-10-CM

## 2021-08-30 DIAGNOSIS — Z3491 Encounter for supervision of normal pregnancy, unspecified, first trimester: Secondary | ICD-10-CM

## 2021-08-30 DIAGNOSIS — O26899 Other specified pregnancy related conditions, unspecified trimester: Secondary | ICD-10-CM | POA: Diagnosis not present

## 2021-08-30 DIAGNOSIS — R1030 Lower abdominal pain, unspecified: Secondary | ICD-10-CM | POA: Diagnosis present

## 2021-08-30 LAB — URINALYSIS, ROUTINE W REFLEX MICROSCOPIC
Bilirubin Urine: NEGATIVE
Glucose, UA: NEGATIVE mg/dL
Hgb urine dipstick: NEGATIVE
Ketones, ur: NEGATIVE mg/dL
Leukocytes,Ua: NEGATIVE
Nitrite: NEGATIVE
Protein, ur: NEGATIVE mg/dL
Specific Gravity, Urine: 1.011 (ref 1.005–1.030)
pH: 6 (ref 5.0–8.0)

## 2021-08-30 LAB — CBC
HCT: 31.9 % — ABNORMAL LOW (ref 36.0–46.0)
Hemoglobin: 11 g/dL — ABNORMAL LOW (ref 12.0–15.0)
MCH: 28.9 pg (ref 26.0–34.0)
MCHC: 34.5 g/dL (ref 30.0–36.0)
MCV: 83.9 fL (ref 80.0–100.0)
Platelets: 271 10*3/uL (ref 150–400)
RBC: 3.8 MIL/uL — ABNORMAL LOW (ref 3.87–5.11)
RDW: 14 % (ref 11.5–15.5)
WBC: 6.1 10*3/uL (ref 4.0–10.5)
nRBC: 0 % (ref 0.0–0.2)

## 2021-08-30 LAB — WET PREP, GENITAL
Clue Cells Wet Prep HPF POC: NONE SEEN
Sperm: NONE SEEN
Trich, Wet Prep: NONE SEEN
WBC, Wet Prep HPF POC: <10 (ref <10)
Yeast Wet Prep HPF POC: NONE SEEN

## 2021-08-30 LAB — POCT PREGNANCY, URINE: Preg Test, Ur: POSITIVE — AB

## 2021-08-30 LAB — HCG, QUANTITATIVE, PREGNANCY: hCG, Beta Chain, Quant, S: 87979 m[IU]/mL — ABNORMAL HIGH (ref <5)

## 2021-08-30 MED ORDER — SCOPOLAMINE 1 MG/3DAYS TD PT72
1.0000 | MEDICATED_PATCH | TRANSDERMAL | Status: DC
  Administered 2021-08-30: 1.5 mg via TRANSDERMAL
  Filled 2021-08-30: qty 1

## 2021-08-30 MED ORDER — PROCHLORPERAZINE EDISYLATE 10 MG/2ML IJ SOLN
10.0000 mg | Freq: Once | INTRAMUSCULAR | Status: AC
  Administered 2021-08-30: 10 mg via INTRAVENOUS
  Filled 2021-08-30: qty 2

## 2021-08-30 MED ORDER — HYDROMORPHONE HCL 1 MG/ML IJ SOLN
0.5000 mg | Freq: Once | INTRAMUSCULAR | Status: AC
  Administered 2021-08-30: 0.5 mg via INTRAVENOUS
  Filled 2021-08-30: qty 1

## 2021-08-30 MED ORDER — PROCHLORPERAZINE MALEATE 10 MG PO TABS: 10.0000 mg | ORAL_TABLET | Freq: Two times a day (BID) | ORAL | 0 refills | Status: DC | PRN

## 2021-08-30 MED ORDER — SCOPOLAMINE 1 MG/3DAYS TD PT72
1.0000 | MEDICATED_PATCH | TRANSDERMAL | 12 refills | Status: DC
Start: 2021-08-30 — End: 2022-12-15

## 2021-08-30 MED ORDER — CYCLOBENZAPRINE HCL 10 MG PO TABS: 10.0000 mg | ORAL_TABLET | Freq: Two times a day (BID) | ORAL | 0 refills | Status: DC | PRN

## 2021-08-30 MED ORDER — LACTATED RINGERS IV BOLUS
1000.0000 mL | Freq: Once | INTRAVENOUS | Status: AC
  Administered 2021-08-30: 1000 mL via INTRAVENOUS

## 2021-08-30 NOTE — MAU Provider Note (Signed)
History     CSN: [id number]  Arrival date and time: 08/30/21 1242   Event Date/Time   First Provider Initiated Contact with Patient 08/30/21 1354      Chief Complaint  Patient presents with   Abdominal Pain   Emesis   Possible Pregnancy   HPI  Leah Shea is a 36 y.o. [id number] at [redacted]w[redacted]d who presents for evaluation of abdominal pain. Patient reports she has lower abdominal pain on the left and right side. She reports the left side is related to her hernia and it is getting worse. Patient rates the pain as a 6/10 and has not tried anything for the pain. She denies any vaginal bleeding, discharge, and leaking of fluid. Denies any constipation, diarrhea or any urinary complaints. Reports normal fetal movement.   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   Transferred from DOD/VA problem list   IBS (irritable bowel syndrome)    PTSD (post-traumatic stress disorder)    Shortness of breath    with exertion or panic attack   Sickle cell trait (HCC)    Sleep apnea    awaiting a sleep study to be done at [facility] ([facility])    Past Surgical History:  Procedure Laterality Date   ABDOMINAL HERNIA REPAIR  10/08/2020   BREAST SURGERY Bilateral    reductions   CESAREAN SECTION  2010, [id number]   x 3   CESAREAN SECTION N/A 09/28/2020   Procedure: CESAREAN SECTION;  Surgeon: Venora Maples, MD;  Location: [facility] LD ORS;  Service: Obstetrics;  Laterality: N/A;   EYE SURGERY     x 10 as a child   LAPAROTOMY  N/A 10/08/2020   Procedure: EXPLORATORY LAPAROTOMY WITH CLOSURE ABDOMINAL WALL DEFECT POSSIBLE BOWEL RESECTION;  Surgeon: Harriette Bouillon, MD;  Location: [facility] OR;  Service: General;  Laterality: N/A;   PILONIDAL CYST EXCISION  2020   TONSILLECTOMY N/A 10/07/2013   Procedure: TONSILLECTOMY;  Surgeon: Christia Reading, MD;  Location: [facility] OR;  Service: ENT;  Laterality: N/A;   TUMOR REMOVAL     tumor removed from back    Family History  Problem Relation Age of Onset   Breast cancer Mother    Clotting disorder Mother    Diabetes Mother    Heart disease Mother    Breast cancer Maternal Grandmother    Diabetes Maternal Grandmother    Prostate cancer Maternal Grandfather        metastatic   Pancreatic cancer Maternal Grandfather    Colon cancer Maternal Grandfather    Diabetes Maternal Grandfather    Breast cancer Maternal Aunt 36       metastatic   Cancer Maternal Aunt  either breast or ovarian    Cancer Maternal Uncle        unk type   Colon polyps Neg Hx    Stomach cancer Neg Hx    Esophageal cancer Neg Hx     Social History   Tobacco Use   Smoking status: Former    Packs/day: 0.25    Years: 1.00    Total pack years: 0.25    Types: Cigarettes    Quit date: 07/02/2013    Years since quitting: 8.1   Smokeless tobacco: Never  Vaping Use   Vaping Use: Never used  Substance Use Topics   Alcohol use: Not Currently   Drug use: Not Currently    Types: Marijuana    Comment: last used January 3    Allergies:  Allergies  Allergen Reactions   Mushroom Extract Complex Anaphylaxis and Hives   Other Anaphylaxis and Hives    Mushrooms   Nsaids Hives, Swelling and Other (See Comments)    "Body burns" Tolerated celecoxib 09/2020    Prenatal Vitamins    Reglan [Metoclopramide] Anxiety   Sulfa Antibiotics Other (See Comments)    Medications Prior to Admission  Medication Sig Dispense Refill Last Dose   escitalopram (LEXAPRO) 20 MG tablet TAKE ONE TABLET BY MOUTH ONCE A DAY  FOR DEPRESSION AND ANXIETY   Past Week   LORazepam (ATIVAN) 0.5 MG tablet Take 1 tablet (0.5 mg total) by mouth every 6 (six) hours as needed for anxiety or sleep. 30 tablet 0 08/29/2021   NIFEdipine (ADALAT CC) 30 MG 24 hr tablet Take 30 mg by mouth daily.   08/29/2021   ondansetron (ZOFRAN-ODT) 4 MG disintegrating tablet Take 1 tablet (4 mg total) by mouth every 4 (four) hours as needed for nausea or vomiting. 20 tablet 0 08/29/2021   promethazine (PHENERGAN) 25 MG suppository Place 25 mg rectally every 6 (six) hours as needed for nausea or vomiting.   08/29/2021   promethazine (PHENERGAN) 25 MG tablet Take 1 tablet (25 mg total) by mouth every 8 (eight) hours as needed for nausea or vomiting. 30 tablet 1 08/29/2021   acetaminophen (TYLENOL) 500 MG tablet Take 1 tablet (500 mg total) by mouth every 6 (six) hours as needed. 30 tablet 0    hyoscyamine (ANASPAZ) 0.125 MG TBDP disintergrating tablet Place 1 tablet (0.125 mg total) under the tongue every 4 (four) hours as needed. 60 tablet 1    lamoTRIgine (LAMICTAL) 200 MG tablet Take 200 mg by mouth 2 (two) times daily.   08/28/2021   levETIRAcetam (KEPPRA) 500 MG tablet Take 1,000 mg by mouth 2 (two) times daily.   08/28/2021   omeprazole (PRILOSEC) 20 MG capsule Take 1 capsule (20 mg total) by mouth daily. 60 capsule 1    SUMAtriptan (IMITREX) 6 MG/0.5ML SOSY injection INJECT 6MG  (ONE SINGLE DOSE AUTOINJECTOR) SUBCUTANEOUSLY TWICE A DAY AS NEEDED FOR MIGRAINE      traMADol (ULTRAM) 50 MG tablet 1-2 tablets every 6 hours as needed for pain 20 tablet 0     Review of Systems  Constitutional: Negative.  Negative for fatigue and fever.  HENT: Negative.    Respiratory: Negative.  Negative for shortness of breath.   Cardiovascular: Negative.  Negative for chest pain.  Gastrointestinal:  Positive for abdominal pain. Negative for constipation, diarrhea, nausea and vomiting.  Genitourinary: Negative.  Negative for dysuria, vaginal bleeding and vaginal  discharge.  Neurological: Negative.  Negative for dizziness and headaches.   Physical Exam   Blood  pressure 126/74, pulse 93, temperature 98.2 F (36.8 C), temperature source Oral, resp. rate 20, height 5' 6.5" (1.689 m), weight 100 kg, last menstrual period 07/19/2021, SpO2 99 %, unknown if currently breastfeeding.  Patient Vitals for the past 24 hrs:  BP Temp Temp src Pulse Resp SpO2 Height Weight  08/30/21 1350 126/74 -- -- 93 -- 99 % -- --  08/30/21 1257 130/84 98.2 F (36.8 C) Oral 87 20 100 % 5' 6.5" (1.689 m) 100 kg    Physical Exam Vitals and nursing note reviewed.  Constitutional:      General: She is not in acute distress.    Appearance: She is well-developed.  HENT:     Head: Normocephalic.  Eyes:     Pupils: Pupils are equal, round, and reactive to light.  Cardiovascular:     Rate and Rhythm: Normal rate and regular rhythm.     Heart sounds: Normal heart sounds.  Pulmonary:     Effort: Pulmonary effort is normal. No respiratory distress.     Breath sounds: Normal breath sounds.  Abdominal:     General: Bowel sounds are normal. There is no distension.     Palpations: Abdomen is soft.     Tenderness: There is no abdominal tenderness.     Comments: Hernia palpable on left side near umbilicus, soft, nontender  Skin:    General: Skin is warm and dry.  Neurological:     Mental Status: She is alert and oriented to person, place, and time.  Psychiatric:        Mood and Affect: Mood normal.        Behavior: Behavior normal.        Thought Content: Thought content normal.        Judgment: Judgment normal.     MAU Course  Procedures  Results for orders placed or performed during the hospital encounter of 08/30/21 (from the past 24 hour(s))  Pregnancy, urine POC     Status: Abnormal   Collection Time: 08/30/21  1:09 PM  Result Value Ref Range   Preg Test, Ur POSITIVE (A) NEGATIVE  Urinalysis, Routine w reflex microscopic     Status: None   Collection Time:  08/30/21  1:13 PM  Result Value Ref Range   Color, Urine YELLOW YELLOW   APPearance CLEAR CLEAR   Specific Gravity, Urine 1.011 1.005 - 1.030   pH 6.0 5.0 - 8.0   Glucose, UA NEGATIVE NEGATIVE mg/dL   Hgb urine dipstick NEGATIVE NEGATIVE   Bilirubin Urine NEGATIVE NEGATIVE   Ketones, ur NEGATIVE NEGATIVE mg/dL   Protein, ur NEGATIVE NEGATIVE mg/dL   Nitrite NEGATIVE NEGATIVE   Leukocytes,Ua NEGATIVE NEGATIVE  CBC     Status: Abnormal   Collection Time: 08/30/21  1:43 PM  Result Value Ref Range   WBC 6.1 4.0 - 10.5 K/uL   RBC 3.80 (L) 3.87 - 5.11 MIL/uL   Hemoglobin 11.0 (L) 12.0 - 15.0 g/dL   HCT 29.4 (L) 76.5 - 46.5 %   MCV 83.9 80.0 - 100.0 fL   MCH 28.9 26.0 - 34.0 pg   MCHC 34.5 30.0 - 36.0 g/dL   RDW 03.5 46.5 - 68.1 %   Platelets 271 150 - 400 K/uL   nRBC 0.0 0.0 - 0.2 %  Wet prep, genital     Status: None   Collection Time: 08/30/21  1:43 PM   Specimen: Vaginal  Result Value Ref Range   Yeast Wet Prep HPF POC NONE SEEN NONE  SEEN   Trich, Wet Prep NONE SEEN NONE SEEN   Clue Cells Wet Prep HPF POC NONE SEEN NONE SEEN   WBC, Wet Prep HPF POC <10 <10   Sperm NONE SEEN      US OB Comp Less 14 Wks  Result Date: 08/30/2021 CLINICAL DATA:  Abdominal pain for 10 days, pregnant EXAM: OBSTETRIC <14 WK ULTRASOUND TECHNIQUE: Transabdominal ultrasound was performed for evaluation of the gestation as well as the maternal uterus and adnexal regions. COMPARISON:  None Available. FINDINGS: Intrauterine gestational sac: Single Yolk sac:  Visualized. Embryo:  Visualized. Cardiac Activity: Visualized. Heart Rate: 159 bpm CRL:   31.7 mm   10 w 1 d                  [place] EDC: 03/27/2022 Subchorionic hemorrhage: There is a small subchorionic hemorrhage along the inferior margin of the gestational sac. Maternal uterus/adnexae: Left ovary measures 2.5 x 1.6 x 1.5 cm and the right ovary measures 4.1 x 2.1 x 2.9 cm. Suspected 2.1 cm corpus luteum cyst within the right ovary. No free fluid. IMPRESSION:  1. Single live intrauterine pregnancy as above, estimated age 57 weeks and 1 day. 2. Small subchorionic hemorrhage. Electronically Signed   By: Randa Ngo M.D.   On: 08/30/2021 15:34     MDM Labs ordered and reviewed.   UA, UPT CBC, HCG ABO/Rh- A Pos Wet prep and gc/chlamydia US OB Comp Less 14 weeks with Transvaginal  LR bolus Compazine IV Scop patch  CNM independently reviewed the imaging ordered. Imaging show live IUP at 10 weeks  Assessment and Plan   1. Normal intrauterine pregnancy on prenatal ultrasound in first trimester   2. [redacted] weeks gestation of pregnancy   3. Abdominal pain affecting pregnancy     -Discharge home in stable condition -Rx for compazine, scop patches and flexeril sent to patient's pharmacy -First trimester precautions discussed -Patient advised to follow-up with OB as scheduled for prenatal care -Patient may return to MAU as needed or if her condition were to change or worsen  Wende Mott, CNM 08/30/2021, 3:42 PM

## 2021-08-30 NOTE — MAU Note (Signed)
Leah Shea is a 36 y.o. at Unknown here in MAU reporting: goes to [facility]", came to house on Thurs 7/6.  Confirmed preg, was given IV fluids.  Thought her hernia was a little distended. Has been throwing up, even with the medications.  Started having pain in RLQ, the pain woke her during the night.  Has been throwing up.  Left side, where hernia is, seems bigger and is more tender now. LMP: 6/5, had some spotting 6/20 Onset of complaint: wk and a half, pain last night. Has been having diarrhea for the last 2 wks, at least  3-4 times a day (watery or slush) Pain score: 6 Vitals:   08/30/21 1257  BP: 130/84  Pulse: 87  Resp: 20  Temp: 98.2 F (36.8 C)  SpO2: 100%      Lab orders placed from triage:  UPT, UA if +

## 2021-08-30 NOTE — Discharge Instructions (Signed)

## 2021-08-31 LAB — GC/CHLAMYDIA PROBE AMP (~~LOC~~) NOT AT ARMC
Chlamydia: NEGATIVE
Comment: NEGATIVE
Comment: NORMAL
Neisseria Gonorrhea: NEGATIVE

## 2021-09-22 ENCOUNTER — Encounter (HOSPITAL_COMMUNITY): Payer: Self-pay | Admitting: Obstetrics & Gynecology

## 2021-09-22 ENCOUNTER — Inpatient Hospital Stay (HOSPITAL_COMMUNITY): Payer: Medicaid Other

## 2021-09-22 ENCOUNTER — Inpatient Hospital Stay (HOSPITAL_COMMUNITY)
Admission: AD | Admit: 2021-09-22 | Discharge: 2021-09-22 | Disposition: A | Discharge status: Home / Self Care | Payer: Medicaid Other | Attending: Obstetrics & Gynecology | Admitting: Obstetrics & Gynecology

## 2021-09-22 DIAGNOSIS — O039 Complete or unspecified spontaneous abortion without complication: Secondary | ICD-10-CM | POA: Insufficient documentation

## 2021-09-22 DIAGNOSIS — Z3A13 13 weeks gestation of pregnancy: Secondary | ICD-10-CM

## 2021-09-22 LAB — CBC WITH DIFFERENTIAL/PLATELET
Abs Immature Granulocytes: 0.05 10*3/uL (ref 0.00–0.07)
Basophils Absolute: 0.1 10*3/uL (ref 0.0–0.1)
Basophils Relative: 1 %
Eosinophils Absolute: 0.2 10*3/uL (ref 0.0–0.5)
Eosinophils Relative: 2 %
HCT: 30.8 % — ABNORMAL LOW (ref 36.0–46.0)
Hemoglobin: 10.4 g/dL — ABNORMAL LOW (ref 12.0–15.0)
Immature Granulocytes: 1 %
Lymphocytes Relative: 26 %
Lymphs Abs: 2.3 10*3/uL (ref 0.7–4.0)
MCH: 28.7 pg (ref 26.0–34.0)
MCHC: 33.8 g/dL (ref 30.0–36.0)
MCV: 85.1 fL (ref 80.0–100.0)
Monocytes Absolute: 0.7 10*3/uL (ref 0.1–1.0)
Monocytes Relative: 8 %
Neutro Abs: 5.7 10*3/uL (ref 1.7–7.7)
Neutrophils Relative %: 62 %
Platelets: 236 10*3/uL (ref 150–400)
RBC: 3.62 MIL/uL — ABNORMAL LOW (ref 3.87–5.11)
RDW: 14.1 % (ref 11.5–15.5)
WBC: 9 10*3/uL (ref 4.0–10.5)
nRBC: 0 % (ref 0.0–0.2)

## 2021-09-22 LAB — HCG, QUANTITATIVE, PREGNANCY: hCG, Beta Chain, Quant, S: 23568 m[IU]/mL — ABNORMAL HIGH (ref <5)

## 2021-09-22 MED ORDER — ONDANSETRON 4 MG PO TBDP: 4.0000 mg | ORAL_TABLET | ORAL | 1 refills | Status: AC | PRN

## 2021-09-22 MED ORDER — OXYCODONE-ACETAMINOPHEN 5-325 MG PO TABS: 1.0000 | ORAL_TABLET | Freq: Four times a day (QID) | ORAL | 0 refills | Status: DC | PRN

## 2021-09-22 MED ORDER — HYDROMORPHONE HCL 1 MG/ML IJ SOLN
2.0000 mg | Freq: Once | INTRAMUSCULAR | Status: AC
  Administered 2021-09-22: 2 mg via INTRAVENOUS
  Filled 2021-09-22: qty 2

## 2021-09-22 MED ORDER — LACTATED RINGERS IV SOLN: INTRAVENOUS | Status: DC

## 2021-09-22 MED ORDER — HYDROMORPHONE HCL 1 MG/ML IJ SOLN
1.0000 mg | Freq: Once | INTRAMUSCULAR | Status: AC
  Administered 2021-09-22: 1 mg via INTRAVENOUS
  Filled 2021-09-22: qty 1

## 2021-09-22 MED ORDER — MISOPROSTOL 200 MCG PO TABS: 800.0000 ug | ORAL_TABLET | Freq: Once | ORAL | 0 refills | Status: DC

## 2021-09-22 MED ORDER — LACTATED RINGERS IV BOLUS
1000.0000 mL | Freq: Once | INTRAVENOUS | Status: AC
  Administered 2021-09-22: 1000 mL via INTRAVENOUS

## 2021-09-22 MED ORDER — ONDANSETRON HCL 4 MG/2ML IJ SOLN
4.0000 mg | Freq: Once | INTRAMUSCULAR | Status: AC
  Administered 2021-09-22: 4 mg via INTRAVENOUS
  Filled 2021-09-22: qty 2

## 2021-09-22 MED ORDER — PROMETHAZINE HCL 25 MG PO TABS: 25.0000 mg | ORAL_TABLET | Freq: Three times a day (TID) | ORAL | 2 refills | Status: AC | PRN

## 2021-09-22 NOTE — MAU Note (Addendum)
...  Leah Shea is a 36 y.o. at [redacted]w[redacted]d here in MAU reporting: Last night she started experiencing sharp intermittent lower abdominal pain and lower back pain around 2045. She reports around 2103 she began experiencing vaginal bleeding. She reports this morning between 0500-0700 she was passing consistent blood clots that were the size of a baseball. She reports since 0700 she has changed her maxi pad three times. She reports she has had multiple miscarriages in the past and is sure that this is a miscarriage. She reports she has been feeling short of breath and dizzy since 0500 this morning.   Last took 1000 mg of Tylenol around 0200 this morning. Did not touch her pain.  Onset of complaint: Last night around 2045. Pain score:  8/10 lower abdomen 6/10 lower back

## 2021-09-22 NOTE — MAU Provider Note (Signed)
History     CSN: [id number]  Arrival date and time: 09/22/21 1214   Event Date/Time   First Provider Initiated Contact with Patient 09/22/21 1305      Chief Complaint  Patient presents with   Abdominal Pain   Back Pain   Vaginal Bleeding   HPI  Leah Shea is a 36 y.o. [id number] at [redacted]w[redacted]d who presents for evaluation of vaginal bleeding and pain. Patient reports she woke up at 0100 and started having heavy bleeding and contractions. She states all night she has been passing large clots and having heavy bleeding. Patient rates the pain as a 10/10 and has not tried anything for the pain. 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   Transferred from DOD/VA problem list   IBS (irritable bowel syndrome)    PTSD (post-traumatic stress disorder)    Shortness of breath    with exertion or panic attack   Sickle cell trait (HCC)    Sleep apnea    awaiting a sleep study to be done at [facility] ([facility])    Past Surgical History:  Procedure Laterality Date   ABDOMINAL HERNIA REPAIR  10/08/2020   BREAST SURGERY Bilateral    reductions   CESAREAN SECTION  2010, 6301,6010   x 3   CESAREAN SECTION N/A 09/28/2020   Procedure: CESAREAN SECTION;  Surgeon: Venora Maples, MD;  Location: [facility] ORS;  Service: Obstetrics;  Laterality: N/A;   EYE SURGERY     x 10 as a child   LAPAROTOMY N/A  10/08/2020   Procedure: EXPLORATORY LAPAROTOMY WITH CLOSURE ABDOMINAL WALL DEFECT POSSIBLE BOWEL RESECTION;  Surgeon: Harriette Bouillon, MD;  Location: [facility] OR;  Service: General;  Laterality: N/A;   PILONIDAL CYST EXCISION  2020   TONSILLECTOMY N/A 10/07/2013   Procedure: TONSILLECTOMY;  Surgeon: Christia Reading, MD;  Location: [facility] OR;  Service: ENT;  Laterality: N/A;   TUMOR REMOVAL     tumor removed from back    Family History  Problem Relation Age of Onset   Breast cancer Mother    Clotting disorder Mother    Diabetes Mother    Heart disease Mother    Breast cancer Maternal Grandmother    Diabetes Maternal Grandmother    Prostate cancer Maternal Grandfather        metastatic   Pancreatic cancer Maternal Grandfather    Colon cancer Maternal Grandfather    Diabetes Maternal Grandfather    Breast cancer Maternal Aunt 36       metastatic   Cancer Maternal Aunt  either breast or ovarian    Cancer Maternal Uncle        unk type   Colon polyps Neg Hx    Stomach cancer Neg Hx    Esophageal cancer Neg Hx     Social History   Tobacco Use   Smoking status: Former    Packs/day: 0.25    Years: 1.00    Total pack years: 0.25    Types: Cigarettes    Quit date: 07/02/2013    Years since quitting: 8.2   Smokeless tobacco: Never  Vaping Use   Vaping Use: Never used  Substance Use Topics   Alcohol use: Not Currently   Drug use: Not Currently    Types: Marijuana    Comment: last used January 3    Allergies:  Allergies  Allergen Reactions   Mushroom Extract Complex Anaphylaxis and Hives   Other Anaphylaxis and Hives    Mushrooms   Nsaids Hives, Swelling and Other (See Comments)    "Body burns" Tolerated celecoxib 09/2020    Prenatal Vitamins    Compazine [Prochlorperazine] Anxiety   Reglan [Metoclopramide] Anxiety   Sulfa Antibiotics Other (See Comments)    Medications Prior to Admission  Medication Sig Dispense Refill Last Dose   acetaminophen (TYLENOL) 500 MG  tablet Take 1 tablet (500 mg total) by mouth every 6 (six) hours as needed. 30 tablet 0 09/22/2021   lamoTRIgine (LAMICTAL) 200 MG tablet Take 200 mg by mouth 2 (two) times daily.   Past Week   levETIRAcetam (KEPPRA) 500 MG tablet Take 1,000 mg by mouth 2 (two) times daily.   Past Week   LORazepam (ATIVAN) 0.5 MG tablet Take 1 tablet (0.5 mg total) by mouth every 6 (six) hours as needed for anxiety or sleep. 30 tablet 0 Past Week   omeprazole (PRILOSEC) 20 MG capsule Take 1 capsule (20 mg total) by mouth daily. 60 capsule 1 Past Week   ondansetron (ZOFRAN-ODT) 4 MG disintegrating tablet Take 1 tablet (4 mg total) by mouth every 4 (four) hours as needed for nausea or vomiting. 20 tablet 0 09/22/2021   scopolamine (TRANSDERM-SCOP) 1 MG/3DAYS Place 1 patch (1.5 mg total) onto the skin every 3 (three) days. 10 patch 12 Past Week   cyclobenzaprine (FLEXERIL) 10 MG tablet Take 1 tablet (10 mg total) by mouth 2 (two) times daily as needed for muscle spasms. 20 tablet 0    escitalopram (LEXAPRO) 20 MG tablet TAKE ONE TABLET BY MOUTH ONCE A DAY FOR DEPRESSION AND ANXIETY      hyoscyamine (ANASPAZ) 0.125 MG TBDP disintergrating tablet Place 1 tablet (0.125 mg total) under the tongue every 4 (four) hours as needed. 60 tablet 1    NIFEdipine (ADALAT CC) 30 MG 24 hr tablet Take 30 mg by mouth daily.      prochlorperazine (COMPAZINE) 10 MG tablet Take 1 tablet (10 mg total) by mouth 2 (two) times daily as needed for nausea or vomiting. 30 tablet 0    promethazine (PHENERGAN) 25 MG suppository Place 25 mg rectally every 6 (six) hours as needed for nausea or vomiting.      promethazine (PHENERGAN) 25 MG tablet Take 1 tablet (25 mg total) by mouth every 8 (eight) hours as needed for nausea or vomiting. 30 tablet 1    SUMAtriptan (IMITREX) 6 MG/0.5ML SOSY injection INJECT 6MG  (ONE SINGLE DOSE AUTOINJECTOR) SUBCUTANEOUSLY TWICE A DAY AS NEEDED FOR MIGRAINE      traMADol (ULTRAM) 50 MG tablet 1-2 tablets every 6 hours  as  needed for pain 20 tablet 0     Review of Systems  Constitutional: Negative.  Negative for fatigue and fever.  HENT: Negative.    Respiratory: Negative.  Negative for shortness of breath.   Cardiovascular: Negative.  Negative for chest pain.  Gastrointestinal:  Positive for abdominal pain. Negative for constipation, diarrhea, nausea and vomiting.  Genitourinary:  Positive for vaginal bleeding. Negative for dysuria and vaginal discharge.  Neurological: Negative.  Negative for dizziness and headaches.   Physical Exam   Blood pressure (!) 138/98, pulse 100, temperature 98.4 F (36.9 C), temperature source Oral, resp. rate 17, height 5' 6.5" (1.689 m), weight 99 kg, last menstrual period 07/19/2021, SpO2 100 %, unknown if currently breastfeeding.  Patient Vitals for the past 24 hrs:  BP Temp Temp src Pulse Resp SpO2 Height Weight  09/22/21 1258 (!) 138/98 -- -- 100 -- -- -- --  09/22/21 1241 (!) 126/90 98.4 F (36.9 C) Oral 96 17 100 % 5' 6.5" (1.689 m) 99 kg    Physical Exam Vitals and nursing note reviewed.  Constitutional:      General: She is not in acute distress.    Appearance: She is well-developed.  HENT:     Head: Normocephalic.  Eyes:     Pupils: Pupils are equal, round, and reactive to light.  Cardiovascular:     Rate and Rhythm: Normal rate and regular rhythm.     Heart sounds: Normal heart sounds.  Pulmonary:     Effort: Pulmonary effort is normal. No respiratory distress.     Breath sounds: Normal breath sounds.  Abdominal:     General: Bowel sounds are normal. There is no distension.     Palpations: Abdomen is soft.     Tenderness: There is no abdominal tenderness.  Genitourinary:    Comments: SSE: small amount of dark red blood in vault Skin:    General: Skin is warm and dry.  Neurological:     Mental Status: She is alert and oriented to person, place, and time.  Psychiatric:        Mood and Affect: Mood normal.        Behavior: Behavior normal.         Thought Content: Thought content normal.        Judgment: Judgment normal.      MAU Course  Procedures  Results for orders placed or performed during the hospital encounter of 09/22/21 (from the past 24 hour(s))  CBC with Differential/Platelet     Status: Abnormal   Collection Time: 09/22/21  1:45 PM  Result Value Ref Range   WBC 9.0 4.0 - 10.5 K/uL   RBC 3.62 (L) 3.87 - 5.11 MIL/uL   Hemoglobin 10.4 (L) 12.0 - 15.0 g/dL   HCT 41.2 (L) 87.8 - 67.6 %   MCV 85.1 80.0 - 100.0 fL   MCH 28.7 26.0 - 34.0 pg   MCHC 33.8 30.0 - 36.0 g/dL   RDW 72.0 94.7 - 09.6 %   Platelets 236 150 - 400 K/uL   nRBC 0.0 0.0 - 0.2 %   Neutrophils Relative % 62 %   Neutro Abs 5.7 1.7 - 7.7 K/uL   Lymphocytes Relative 26 %   Lymphs Abs 2.3 0.7 - 4.0 K/uL   Monocytes Relative 8 %   Monocytes Absolute 0.7 0.1 - 1.0 K/uL   Eosinophils Relative 2 %   Eosinophils Absolute 0.2 0.0 - 0.5 K/uL   Basophils Relative 1 %  Basophils Absolute 0.1 0.0 - 0.1 K/uL   Immature Granulocytes 1 %   Abs Immature Granulocytes 0.05 0.00 - 0.07 K/uL  hCG, quantitative, pregnancy     Status: Abnormal   Collection Time: 09/22/21  1:45 PM  Result Value Ref Range   hCG, Beta Chain, Quant, S 23,568 (H) <5 mIU/mL     US OB Transvaginal  Result Date: 09/22/2021 CLINICAL DATA:  vaginal bleeding. Gestational age by last menstrual period 9 weeks and 2 days. Gestational age by first ultrasound 10 weeks and 1 day. Last menstrual period 07/19/2021. Quantitative beta HCG 25,568. EXAM: TRANSVAGINAL OB ULTRASOUND TECHNIQUE: Transvaginal ultrasound was performed for complete evaluation of the gestation as well as the maternal uterus, adnexal regions, and pelvic cul-de-sac. COMPARISON:  CT abdomen pelvis 12/10/2020, ultrasound Ob 08/30/2021 FINDINGS: Intrauterine gestational sac: None Yolk sac:  Not Visualized. Embryo:  Not Visualized. Cardiac Activity: Not Visualized. Maternal uterus/adnexae: Heterogeneous endometrium measuring up to 22 mm  with associated hypervascularity. Bilateral ovaries are unremarkable. Other: No free fluid within the pelvis. IMPRESSION: 1. Interval miscarriage with previously identified gestational sac no longer visualized. Thickened and hypervascular endometrium consistent with retained products of conception. 2. No identified ectopic pregnancy. 3. Normal ovaries bilaterally. Electronically Signed   By: Tish Frederickson M.D.   On: 09/22/2021 16:51     MDM Labs ordered and reviewed.   LR bolus Dilaudid IV US OB transvaginal  CNM informed patient of results of ultrasound showing active miscarriage. Condolences provided and space given for patient to process emotions. CNM discussed options for management including expectant management, cytotec. Risks and benefits of each reviewed at length. Patient desires cytotec and verbalized understanding of risks and benefits of this method.   Assessment and Plan   1. Miscarriage   2. [redacted] weeks gestation of pregnancy    -Discharge home in stable condition -Rx for cytotec, percocet, ibuprofen sent to patient's pharmacy -Vaginal bleeding and pain precautions discussed -Patient advised to follow-up with OB at [facility] for SAB follow up -Patient may return to MAU as needed or if her condition were to change or worsen  Rolm Bookbinder, CNM 09/22/2021, 1:06 PM

## 2021-10-08 ENCOUNTER — Inpatient Hospital Stay (HOSPITAL_COMMUNITY)
Admission: AD | Admit: 2021-10-08 | Discharge: 2021-10-08 | Disposition: A | Discharge status: Home / Self Care | Payer: No Typology Code available for payment source | Attending: Obstetrics and Gynecology | Admitting: Obstetrics and Gynecology

## 2021-10-08 ENCOUNTER — Inpatient Hospital Stay (HOSPITAL_COMMUNITY): Payer: No Typology Code available for payment source

## 2021-10-08 ENCOUNTER — Encounter (HOSPITAL_COMMUNITY): Payer: Self-pay | Admitting: Obstetrics and Gynecology

## 2021-10-08 DIAGNOSIS — K581 Irritable bowel syndrome with constipation: Secondary | ICD-10-CM | POA: Insufficient documentation

## 2021-10-08 DIAGNOSIS — K5904 Chronic idiopathic constipation: Secondary | ICD-10-CM

## 2021-10-08 DIAGNOSIS — D649 Anemia, unspecified: Secondary | ICD-10-CM | POA: Diagnosis not present

## 2021-10-08 DIAGNOSIS — D509 Iron deficiency anemia, unspecified: Secondary | ICD-10-CM

## 2021-10-08 DIAGNOSIS — Z91148 Patient's other noncompliance with medication regimen for other reason: Secondary | ICD-10-CM | POA: Diagnosis not present

## 2021-10-08 DIAGNOSIS — O039 Complete or unspecified spontaneous abortion without complication: Secondary | ICD-10-CM | POA: Diagnosis present

## 2021-10-08 LAB — CBC
HCT: 26.7 % — ABNORMAL LOW (ref 36.0–46.0)
Hemoglobin: 8.8 g/dL — ABNORMAL LOW (ref 12.0–15.0)
MCH: 27.8 pg (ref 26.0–34.0)
MCHC: 33 g/dL (ref 30.0–36.0)
MCV: 84.5 fL (ref 80.0–100.0)
Platelets: 316 10*3/uL (ref 150–400)
RBC: 3.16 MIL/uL — ABNORMAL LOW (ref 3.87–5.11)
RDW: 13.6 % (ref 11.5–15.5)
WBC: 5.5 10*3/uL (ref 4.0–10.5)
nRBC: 0 % (ref 0.0–0.2)

## 2021-10-08 LAB — TYPE AND SCREEN
ABO/RH(D): A POS
Antibody Screen: NEGATIVE

## 2021-10-08 LAB — HCG, QUANTITATIVE, PREGNANCY: hCG, Beta Chain, Quant, S: 201 m[IU]/mL — ABNORMAL HIGH (ref <5)

## 2021-10-08 MED ORDER — ONDANSETRON HCL 4 MG/2ML IJ SOLN
4.0000 mg | Freq: Once | INTRAMUSCULAR | Status: AC
  Administered 2021-10-08: 4 mg via INTRAVENOUS
  Filled 2021-10-08: qty 2

## 2021-10-08 MED ORDER — SODIUM CHLORIDE 0.9 % IV SOLN
500.0000 mg | Freq: Once | INTRAVENOUS | Status: AC
  Administered 2021-10-08: 500 mg via INTRAVENOUS
  Filled 2021-10-08: qty 500

## 2021-10-08 MED ORDER — ACETAMINOPHEN 500 MG PO TABS
1000.0000 mg | ORAL_TABLET | Freq: Once | ORAL | Status: AC
  Administered 2021-10-08: 1000 mg via ORAL
  Filled 2021-10-08: qty 2

## 2021-10-08 MED ORDER — DIPHENHYDRAMINE HCL 50 MG/ML IJ SOLN
25.0000 mg | Freq: Once | INTRAMUSCULAR | Status: AC
  Administered 2021-10-08: 25 mg via INTRAVENOUS
  Filled 2021-10-08: qty 1

## 2021-10-08 MED ORDER — SODIUM CHLORIDE 0.9 % IV SOLN
Freq: Once | INTRAVENOUS | Status: AC
Start: 2021-10-08 — End: 2021-10-08

## 2021-10-08 MED ORDER — HYDROCODONE-ACETAMINOPHEN 5-325 MG PO TABS
2.0000 | ORAL_TABLET | Freq: Once | ORAL | Status: AC
  Administered 2021-10-08: 2 via ORAL
  Filled 2021-10-08: qty 2

## 2021-10-08 MED ORDER — POLYETHYLENE GLYCOL 3350 17 G PO PACK: 17.0000 g | PACK | Freq: Every day | ORAL | 0 refills | Status: DC

## 2021-10-08 MED ORDER — ONDANSETRON 4 MG PO TBDP
4.0000 mg | ORAL_TABLET | Freq: Once | ORAL | Status: AC
  Administered 2021-10-08: 4 mg via ORAL
  Filled 2021-10-08: qty 1

## 2021-10-08 MED ORDER — DOCUSATE SODIUM 100 MG PO CAPS: 100.0000 mg | ORAL_CAPSULE | Freq: Two times a day (BID) | ORAL | 2 refills | Status: DC | PRN

## 2021-10-08 NOTE — MAU Note (Signed)
RN called to room due to IV pump beeping. Pt also reporting that her face is itching. Venofer infusion paused. Sam Reita Cliche CNM notified and benadryl ordered. CNM to bedside for evaluation. DC venofer and administer benadryl.

## 2021-10-08 NOTE — Discharge Instructions (Signed)
What to expect after the misoprostol  Cramping, moderate to heavy bleeding, and moderate pain are normal parts of the abortion process as your uterus passes the pregnancy. Most women pass blood clots.  Cramping usually starts one to four hours after you place the misoprostol in your vagina. Bleeding usually starts between 30 minutes to four hours after you place the misoprostol in your vagina. However, it can take up to 24 hours for some women. Heavy bleeding and strong cramps usually last between one and four hours. Call or return to the hospital if you soak through more than two large maxi-pads per hour for three hours in a row.  For pain: Resting and using a heating pad or hot water bottle may help. Both Vicodin and ibuprofen usually help with the pain. You may use either one or both together. Call if your pain is not manageable with Vicodin and ibuprofen.  Vicodin: You may take one or two tablets of Vicodin every four to six hours. You may take the first pill as soon as you feel you need something for the pain. Vicodin may make you feel sleepy, nauseated, or dizzy. Do not exceed this dose.  Ibuprofen (Motrin, Advil, etc.): You may take 800mg of ibuprofen (four over-the-counter tablets) every six to eight hours. You may take the first dose of pills as soon as you feel you need something for the pain. Ibuprofen usually does not cause side effects such as sleepiness, nausea, or dizziness.  Other misoprostol side effects:  The following side effects are not dangerous and usually only last one to four hours. If any of these side effects make you very uncomfortable, you may treat your symptoms with over-the-counter medicines. Call if over-the-counter medicines do not relieve your symptoms.  Nausea or vomiting  Call if you vomit several times and cannot hold down liquids. Over the counter treatment: Benadryl 25mg to 50mg every six hours as needed, or Dramamine 25mg to 50mg every six hours as  needed.  Diarrhea  Call if you have several episodes of diarrhea lasting more than four to five hours and you are having trouble drinking liquids (you may be getting dehydrated). Over the counter treatment: Imodium 2 tablets (4mg) once.  Fever, chills  Misoprostol may cause fever or chills in the first 24 hours. After 24 hours, fever or chills may be a sign of an infection and you should call the clinic. If you already took Tylenol, Vicodin (which has 500mg of Tylenol in it), or ibuprofen for pain, do not take more of the same medication for fever or chills. Call the clinic if your fever is higher than 101 F (or 39 C). Over the counter treatment: Tylenol 500 to 650mg every four hours, Ibuprofen 800mg (four over-the-counter pills) every six to eight hours. How to tell if the abortion is complete  Most women have bleeding and painful cramping. As you pass the pregnancy, the bleeding is usually heavy and the cramping very strong. This usually lasts one to four hours. Most women pass some blood clots in the toilet and the pregnancy is often one of those clots. After the pregnancy passes, the cramps decrease and the bleeding slows down significantly.  Within a few hours after passing the pregnancy, cramps and bleeding should be much improved  

## 2021-10-08 NOTE — MAU Provider Note (Signed)
History     CSN: [id number]  Arrival date and time: 10/08/21 1016   Event Date/Time   First Provider Initiated Contact with Patient 10/08/21 1102      Chief Complaint  Patient presents with   Abdominal Pain   Vaginal Bleeding   HPI Leah Shea is a 36 y.o. [id number] who presents to MAU with chief complaint of ongoing abdominal pain and heavy vaginal bleeding. She is s/p miscarriage at [redacted] weeks GA, diagnosed in MAU 09/22/2021. Patient states she took the prescribed Cytotec 800 mcg on 09/23/2021. Patient states she has bled each day since that time. Some days are heavier than others. She endorses seeing clots about the size of one quarter.  Last night she donned a thin pad at bedtime. When she woke up at 2am it was saturated. She is not sure about ongoing bleeding but reports feeling weak.   Patient also c/o shortness of breath. This is a recurrent problem. She reports normal chest xray at the [place] two days ago. She denies chest pain, palpitations, activity intolerance, weakness or syncope.   Patient also c/o constipation. She states she has not had a bowel movement in two weeks. This is a chronic problem for her. She has not taken medication for this complaint.  Patient planned GYN follow-up at [facility] but states she is waiting for them to call her back. She was evaluated at the [place] earlier this week but states they do not provide any level of OB care and advised her to return to MAU.  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   Transferred from DOD/VA problem list   IBS (irritable bowel syndrome)    PTSD (post-traumatic stress disorder)    Shortness of breath    with exertion or panic attack   Sickle cell trait (HCC)    Sleep apnea    awaiting a sleep study to be done at [facility] ([facility])    Past Surgical History:  Procedure Laterality Date   ABDOMINAL HERNIA REPAIR  10/08/2020   BREAST SURGERY Bilateral    reductions   CESAREAN SECTION  2010, 9417,4081   x 3   CESAREAN SECTION N/A 09/28/2020   Procedure: CESAREAN SECTION;  Surgeon: Venora Maples, MD;  [facility]: [facility] LD [facility]S;  Service: Obstetrics;  Laterality: N/A;   EYE SURGERY     x 10 as a child   LAPAROTOMY N/A 10/08/2020   Procedure: EXPLORATORY LAPAROTOMY WITH CLOSURE ABDOMINAL WALL DEFECT POSSIBLE BOWEL RESECTION;  Surgeon: Harriette Bouillon, MD;  Location: [facility] OR;  Service: General;  Laterality: N/A;   PILONIDAL CYST EXCISION  2020   TONSILLECTOMY N/A 10/07/2013   Procedure: TONSILLECTOMY;  Surgeon: Christia Reading,  MD;  Location: [facility] OR;  Service: ENT;  Laterality: N/A;   TUMOR REMOVAL     tumor removed from back    Family History  Problem Relation Age of Onset   Breast cancer Mother    Clotting disorder Mother    Diabetes Mother    Heart disease Mother    Breast cancer Maternal Grandmother    Diabetes Maternal Grandmother    Prostate cancer Maternal Grandfather        metastatic   Pancreatic cancer Maternal Grandfather    Colon cancer Maternal Grandfather    Diabetes Maternal Grandfather    Breast cancer Maternal Aunt 36       metastatic   Cancer Maternal Aunt        either breast or ovarian    Cancer Maternal Uncle        unk type   Colon polyps Neg Hx    Stomach cancer Neg Hx    Esophageal cancer Neg Hx     Social History   Tobacco Use   Smoking status: Former    Packs/day: 0.25    Years: 1.00    Total pack years: 0.25    Types: Cigarettes    Quit date: 07/02/2013    Years since quitting: 8.2    Smokeless tobacco: Never  Vaping Use   Vaping Use: Never used  Substance Use Topics   Alcohol use: Not Currently   Drug use: Not Currently    Types: Marijuana    Comment: last used January 3    Allergies:  Allergies  Allergen Reactions   Mushroom Extract Complex Anaphylaxis and Hives   Other Anaphylaxis and Hives    Mushrooms   Nsaids Hives, Swelling and Other (See Comments)    "Body burns" Tolerated celecoxib 09/2020    Prenatal Vitamins    Compazine [Prochlorperazine] Anxiety   Reglan [Metoclopramide] Anxiety   Sulfa Antibiotics Other (See Comments)    Medications Prior to Admission  Medication Sig Dispense Refill Last Dose   acetaminophen (TYLENOL) 500 MG tablet Take 1 tablet (500 mg total) by mouth every 6 (six) hours as needed. 30 tablet 0    cyclobenzaprine (FLEXERIL) 10 MG tablet Take 1 tablet (10 mg total) by mouth 2 (two) times daily as needed for muscle spasms. 20 tablet 0    escitalopram (LEXAPRO) 20 MG tablet TAKE ONE TABLET BY MOUTH ONCE A DAY FOR DEPRESSION AND ANXIETY      hyoscyamine (ANASPAZ) 0.125 MG TBDP disintergrating tablet Place 1 tablet (0.125 mg total) under the tongue every 4 (four) hours as needed. 60 tablet 1    lamoTRIgine (LAMICTAL) 200 MG tablet Take 200 mg by mouth 2 (two) times daily.      levETIRAcetam (KEPPRA) 500 MG tablet Take 1,000 mg by mouth 2 (two) times daily.      LORazepam (ATIVAN) 0.5 MG tablet Take 1 tablet (0.5 mg total) by mouth every 6 (six) hours as needed for anxiety or sleep. 30 tablet 0    misoprostol (CYTOTEC) 200 MCG tablet Take 4 tablets (800 mcg total) by mouth once for 1 dose. 4 tablet 0    NIFEdipine (ADALAT CC) 30 MG 24 hr tablet Take 30 mg by mouth daily.      omeprazole (PRILOSEC) 20 MG capsule Take 1 capsule (20 mg total) by mouth daily. 60 capsule 1    ondansetron (ZOFRAN-ODT) 4 MG disintegrating tablet Take 1 tablet (4 mg total) by mouth every 4 (four) hours as needed for nausea  or vomiting. 30 tablet 1     oxyCODONE-acetaminophen (PERCOCET/ROXICET) 5-325 MG tablet Take 1 tablet by mouth every 6 (six) hours as needed for severe pain. 20 tablet 0    prochlorperazine (COMPAZINE) 10 MG tablet Take 1 tablet (10 mg total) by mouth 2 (two) times daily as needed for nausea or vomiting. 30 tablet 0    promethazine (PHENERGAN) 25 MG suppository Place 25 mg rectally every 6 (six) hours as needed for nausea or vomiting.      promethazine (PHENERGAN) 25 MG tablet Take 1 tablet (25 mg total) by mouth every 8 (eight) hours as needed for nausea or vomiting. 30 tablet 2    scopolamine (TRANSDERM-SCOP) 1 MG/3DAYS Place 1 patch (1.5 mg total) onto the skin every 3 (three) days. 10 patch 12    SUMAtriptan (IMITREX) 6 MG/0.5ML SOSY injection INJECT 6MG  (ONE SINGLE DOSE AUTOINJECTOR) SUBCUTANEOUSLY TWICE A DAY AS NEEDED FOR MIGRAINE      traMADol (ULTRAM) 50 MG tablet 1-2 tablets every 6 hours as needed for pain 20 tablet 0     Review of Systems  Constitutional:  Positive for fatigue.  Respiratory:  Positive for shortness of breath.   Gastrointestinal:  Positive for abdominal pain.  Genitourinary:  Positive for vaginal bleeding.  All other systems reviewed and are negative.  Physical Exam   Blood pressure (!) 150/93, pulse 89, temperature 98.2 F (36.8 C), temperature source Oral, resp. rate 18, height 5' 6.5" (1.689 m), weight 97.5 kg, last menstrual period 07/19/2021, SpO2 100 %, unknown if currently breastfeeding.  Physical Exam Vitals and nursing note reviewed. Exam conducted with a chaperone present.  Constitutional:      Appearance: She is well-developed. She is ill-appearing.  Cardiovascular:     Rate and Rhythm: Normal rate and regular rhythm.     Heart sounds: Normal heart sounds.  Pulmonary:     Effort: Pulmonary effort is normal.     Breath sounds: Normal breath sounds.  Abdominal:     General: Abdomen is flat. Bowel sounds are normal.     Palpations: Abdomen is soft.     Tenderness: There is no  abdominal tenderness. There is no right CVA tenderness or left CVA tenderness.  Genitourinary:    Comments: Pelvic exam: External genitalia normal, vaginal walls pink and well rugated, cervix visually closed, no lesions noted. One small strand of dark red blood near cervical os. Vaginal vault otherwise without bleeding.   Skin:    Capillary Refill: Capillary refill takes less than 2 seconds.  Neurological:     Mental Status: She is alert and oriented to person, place, and time.  Psychiatric:        Mood and Affect: Mood normal.        Behavior: Behavior normal.     MAU Course  Procedures  MDM  --EMR reviewed: Quant hCG 340, Hgb 9.2 at 09/18/2021 on 10/05/21  --Hgb 8.8 today. Will plan to give Venofer once ultrasound report is received.  --Blood work and ultrasound results reviewed with Dr. 10/07/21. Additional Cytotec, scheduled D&E not indicated. Patient agreeable to Venofer  --1840: Notified by RN Para March that patient was reporting new onset itching and also reported feeling bumps on her face. No SOB, flank pain, chest pain or palpitations. CNM to bedside. Will d/c Venofer (initiated 1513), give additional IV Benadryl for itching. Patient agreeable with this plan  --1915: CNM returned to bedside. Patient A&O x 4, no longer experiencing itching. Verbalizes she is comfortable discharging home with  plan to follow-up in office, rest at home this week  Orders Placed This Encounter  Procedures   US PELVIS TRANSVAGINAL NON-OB (TV ONLY)   CBC   hCG, quantitative, pregnancy   Diet regular Room service appropriate? Yes; Fluid consistency: Thin   Pulse oximetry, continuous   Type and screen [facility]   Insert peripheral IV   Discharge patient   Results for orders placed or performed during the hospital encounter of 10/08/21 (from the past 24 hour(s))  CBC     Status: Abnormal   Collection Time: 10/08/21 11:19 AM  Result Value Ref Range   WBC 5.5 4.0 - 10.5 K/uL   RBC 3.16  (L) 3.87 - 5.11 MIL/uL   Hemoglobin 8.8 (L) 12.0 - 15.0 g/dL   HCT 29.9 (L) 24.2 - 68.3 %   MCV 84.5 80.0 - 100.0 fL   MCH 27.8 26.0 - 34.0 pg   MCHC 33.0 30.0 - 36.0 g/dL   RDW 41.9 62.2 - 29.7 %   Platelets 316 150 - 400 K/uL   nRBC 0.0 0.0 - 0.2 %  hCG, quantitative, pregnancy     Status: Abnormal   Collection Time: 10/08/21 11:19 AM  Result Value Ref Range   hCG, Beta Chain, Quant, S 201 (H) <5 mIU/mL  Type and screen [facility]     Status: None   Collection Time: 10/08/21 11:19 AM  Result Value Ref Range   ABO/RH(D) A POS    Antibody Screen NEG    Sample Expiration      10/11/2021,2359 Performed at [facility], [address], [place], [place] [address]    US PELVIS TRANSVAGINAL NON-OB (TV ONLY)  Result Date: 10/08/2021 CLINICAL DATA:  194174: Heavy vaginal bleeding and miscarriage. Cytotec on 08/10. EXAM: TRANSVAGINAL ULTRASOUND OF PELVIS DOPPLER ULTRASOUND OF OVARIES TECHNIQUE: Transvaginal ultrasound examination of the pelvis was performed including evaluation of the uterus, ovaries, adnexal regions, and pelvic cul-de-sac. Color and duplex Doppler ultrasound was utilized to evaluate blood flow to the ovaries. COMPARISON:  None Available. FINDINGS: Uterus Measurements: 7.9 x 6.6 x 8.2 cm = volume: 336.8 mL. No fibroids or other mass visualized. Endometrium Thickness: 11 mm.  No focal abnormality visualized. Right ovary Measurements: 3.8 x 2.1 x 2.2 cm = volume: 9.2 mL. There is a prominent follicle measuring 2 x 1.9 cm. Left ovary Left ovary is not visualized Pulsed Doppler evaluation demonstrates normal low-resistance arterial and venous waveforms in both ovaries. Other findings:  No abnormal free fluid IMPRESSION: 1. Uterus and right ovary have a normal appearance. Endometrial stripe measures 11 mm and is in the upper limits of normal. 2.  Left ovary is not visualized. Electronically Signed   By: Marjo Bicker M.D.   On: 10/08/2021 13:56    Meds ordered  this encounter  Medications   acetaminophen (TYLENOL) tablet 1,000 mg   ondansetron (ZOFRAN-ODT) disintegrating tablet 4 mg   0.9 %  sodium chloride infusion   iron sucrose (VENOFER) 500 mg in sodium chloride 0.9 % 250 mL IVPB   diphenhydrAMINE (BENADRYL) injection 25 mg   HYDROcodone-acetaminophen (NORCO/VICODIN) 5-325 MG per tablet 2 tablet   ondansetron (ZOFRAN) injection 4 mg   diphenhydrAMINE (BENADRYL) injection 25 mg   polyethylene glycol (MIRALAX) 17 g packet    Sig: Take 17 g by mouth daily.    Dispense:  14 each    Refill:  0    Order Specific Question:   Supervising Provider  Answer:   Reva Bores [[id number]]   docusate sodium (COLACE) 100 MG capsule    Sig: Take 1 capsule (100 mg total) by mouth 2 (two) times daily as needed.    Dispense:  30 capsule    Refill:  2    Order Specific Question:   Supervising Provider    Answer:   Samara Snide   Assessment and Plan  --36 y.o. [id number] s/p miscarriage 09/22/21 --S/p Cytotec 800 mcg 09/23/21, recovering appropriately --Anemia, s/p Venofer infusion in MAU --SOB, lungs CTAB, Pulse ox 99+ throughout MAU encounter --S/p normal chest xray at [facility] 10/05/21 --Chronic constipation, outpatient medication initiated --Care coordinated with Dr. Para March --Discharge home in stable condition  F/U: Patient prefers follow-up at [facility]. High alert in-basket sent to please schedule follow-up in one week  Calvert Cantor, MSA, MSN, CNM 10/08/2021, 7:40 PM

## 2021-10-08 NOTE — MAU Note (Signed)
Leah Shea is a 36 y.o. in MAU reporting: an OB/GYN appt at the [place] today.  Is still bleeding from miscarriage, passing clots.  They told her her HGB was low, they do not deal with OB related things, so sent her here stat. Took Cytotec on 8/10. Today was her f/u.  Bleeding comes and goes, seems little bit heavier than a normal period, still passing clots and having contractions.  Feels light headed, SOB, and nauseated.  LMP: 6/5 Onset of complaint: ongoing problem since 8/9 Pain score: 7 Vitals:   10/08/21 1046  BP: 130/89  Pulse: 98  Resp: 18  Temp: 98.2 F (36.8 C)  SpO2: 100%      Lab orders placed from triage:

## 2021-10-08 NOTE — MAU Note (Signed)
Meal tray delivered, POC discussed with patient

## 2021-10-27 ENCOUNTER — Ambulatory Visit: Payer: No Typology Code available for payment source | Admitting: Obstetrics and Gynecology

## 2021-12-24 ENCOUNTER — Ambulatory Visit (INDEPENDENT_AMBULATORY_CARE_PROVIDER_SITE_OTHER): Payer: No Typology Code available for payment source

## 2021-12-24 ENCOUNTER — Ambulatory Visit
Admission: RE | Admit: 2021-12-24 | Discharge: 2021-12-24 | Disposition: A | Discharge status: Home / Self Care | Payer: Medicaid Other | Source: Ambulatory Visit | Attending: Urgent Care | Admitting: Urgent Care

## 2021-12-24 VITALS — BP 142/89 | HR 78 | Temp 98.8°F | Resp 16

## 2021-12-24 DIAGNOSIS — R059 Cough, unspecified: Secondary | ICD-10-CM

## 2021-12-24 DIAGNOSIS — G40909 Epilepsy, unspecified, not intractable, without status epilepticus: Secondary | ICD-10-CM

## 2021-12-24 DIAGNOSIS — J42 Unspecified chronic bronchitis: Secondary | ICD-10-CM | POA: Diagnosis not present

## 2021-12-24 DIAGNOSIS — J018 Other acute sinusitis: Secondary | ICD-10-CM | POA: Diagnosis not present

## 2021-12-24 DIAGNOSIS — R0789 Other chest pain: Secondary | ICD-10-CM | POA: Diagnosis not present

## 2021-12-24 MED ORDER — AMOXICILLIN-POT CLAVULANATE 400-57 MG/5ML PO SUSR: 800.0000 mg | Freq: Two times a day (BID) | ORAL | 0 refills | Status: DC

## 2021-12-24 MED ORDER — PROMETHAZINE-DM 6.25-15 MG/5ML PO SYRP: 5.0000 mL | ORAL_SOLUTION | Freq: Three times a day (TID) | ORAL | 0 refills | Status: DC | PRN

## 2021-12-24 MED ORDER — AMOXICILLIN-POT CLAVULANATE 875-125 MG PO TABS: 1.0000 | ORAL_TABLET | Freq: Two times a day (BID) | ORAL | 0 refills | Status: DC

## 2021-12-24 MED ORDER — ACETAMINOPHEN 160 MG/5ML PO SUSP: 640.0000 mg | Freq: Four times a day (QID) | ORAL | 0 refills | Status: DC | PRN

## 2021-12-24 NOTE — ED Provider Notes (Signed)
[facility] - URGENT [facility]ARE [facility]  Note:  This document was prepared using Conservation officer, historic buildings and may include unintentional dictation errors.  MRN: [id number] DOB: [birth year]  Subjective:   Leah Shea is a 36 y.o. female presenting for 1 week history of persistent coughing, chest pain, throat pain, having severe sinus congestion and postnasal drainage.  Patient underwent a course of steroids, has been using her albuterol inhaler.  She is also using her breathing treatments without much improvement.  She is now noticing that she is having drainage from her eyes.  Has a history of seizure disorder and reports that she last had a seizure 3 days ago.  She does not want to be tested for COVID.  No current facility-administered medications for this encounter.  Current Outpatient Medications:    acetaminophen (TYLENOL) 500 MG tablet, Take 1 tablet (500 mg total) by mouth every 6 (six) hours as needed., Disp: 30 tablet, Rfl: 0   cyclobenzaprine (FLEXERIL) 10 MG tablet, Take 1 tablet (10 mg total) by mouth 2 (two) times daily as needed for muscle spasms., Disp: 20 tablet, Rfl: 0   docusate sodium (COLACE) 100 MG capsule, Take 1 capsule (100 mg total) by mouth 2 (two) times daily as needed., Disp: 30 capsule, Rfl: 2   escitalopram (LEXAPRO) 20 MG tablet, TAKE ONE TABLET BY MOUTH ONCE A DAY FOR DEPRESSION AND ANXIETY, Disp: , Rfl:    lamoTRIgine (LAMICTAL) 200 MG tablet, Take 200 mg by mouth 2 (two) times daily., Disp: , Rfl:    levETIRAcetam (KEPPRA) 500 MG tablet, Take 1,000 mg by mouth 2 (two) times daily., Disp: , Rfl:    LORazepam (ATIVAN) 0.5 MG tablet, Take 1 tablet (0.5 mg total) by mouth every 6 (six) hours as needed for anxiety or sleep., Disp: 30 tablet, Rfl: 0   NIFEdipine (ADALAT CC) 30 MG 24 hr tablet, Take 30 mg by mouth daily., Disp: , Rfl:    omeprazole (PRILOSEC) 20 MG capsule, Take 1 capsule (20 mg total) by mouth daily., Disp: 60 capsule, Rfl: 1    ondansetron (ZOFRAN-ODT) 4 MG disintegrating tablet, Take 1 tablet (4 mg total) by mouth every 4 (four) hours as needed for nausea or vomiting., Disp: 30 tablet, Rfl: 1   polyethylene glycol (MIRALAX) 17 g packet, Take 17 g by mouth daily., Disp: 14 each, Rfl: 0   prochlorperazine (COMPAZINE) 10 MG tablet, Take 1 tablet (10 mg total) by mouth 2 (two) times daily as needed for nausea or vomiting., Disp: 30 tablet, Rfl: 0   promethazine (PHENERGAN) 25 MG tablet, Take 1 tablet (25 mg total) by mouth every 8 (eight) hours as needed for nausea or vomiting., Disp: 30 tablet, Rfl: 2   scopolamine (TRANSDERM-SCOP) 1 MG/3DAYS, Place 1 patch (1.5 mg total) onto the skin every 3 (three) days., Disp: 10 patch, Rfl: 12   SUMAtriptan (IMITREX) 6 MG/0.5ML SOSY injection, INJECT 6MG  (ONE SINGLE DOSE AUTOINJECTOR) SUBCUTANEOUSLY TWICE A DAY AS NEEDED FOR MIGRAINE, Disp: , Rfl:    Allergies  Allergen Reactions   Mushroom Extract Complex Anaphylaxis and Hives   Other Anaphylaxis and Hives    Mushrooms   Nsaids Hives, Swelling and Other (See Comments)    "Body burns" Tolerated celecoxib 09/2020    Prenatal Vitamins    Compazine [Prochlorperazine] Anxiety   Reglan [Metoclopramide] Anxiety   Sulfa Antibiotics Other (See Comments)    Past Medical History:  Diagnosis Date   Anemia    Anxiety    Asthma  Bronchitis, acute    Chronic hypertension affecting pregnancy 04/30/2020   Complex partial seizures (HCC) 2020   Depression    Diverticular disease    G6PD deficiency    Gestational diabetes 08/26/2020   Headache(784.0)    migraines   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   Transferred from DOD/VA problem list   IBS (irritable bowel syndrome)    PTSD (post-traumatic stress disorder)    Shortness of breath    with exertion or panic attack   Sickle cell trait (HCC)    Sleep apnea    awaiting a sleep study to be done at [facility] ([facility])     Past Surgical History:  Procedure Laterality Date   ABDOMINAL  HERNIA REPAIR  10/08/2020   BREAST SURGERY Bilateral    reductions   CESAREAN SECTION  2010, 0981,1914   x 3   CESAREAN SECTION N/A 09/28/2020   Procedure: CESAREAN SECTION;  Surgeon: Venora Maples, MD;  Location: [facility] LD ORS;  Service: Obstetrics;  Laterality: N/A;   EYE SURGERY     x 10 as a child   LAPAROTOMY N/A 10/08/2020   Procedure: EXPLORATORY LAPAROTOMY WITH CLOSURE ABDOMINAL WALL DEFECT POSSIBLE BOWEL RESECTION;  Surgeon: Harriette Bouillon, MD;  Location: [facility] OR;  Service: General;  Laterality: N/A;   PILONIDAL CYST EXCISION  2020   TONSILLECTOMY N/A 10/07/2013   Procedure: TONSILLECTOMY;  Surgeon: Christia Reading, MD;  Location: [facility] OR;  Service: ENT;  Laterality: N/A;   TUMOR REMOVAL     tumor removed from back    Family History  Problem Relation Age of Onset   Breast cancer Mother    Clotting disorder Mother    Diabetes Mother    Heart disease Mother    Breast cancer Maternal Grandmother    Diabetes Maternal Grandmother    Prostate cancer Maternal Grandfather        metastatic   Pancreatic cancer Maternal Grandfather    Colon cancer Maternal Grandfather    Diabetes Maternal Grandfather    Breast cancer Maternal Aunt 36       metastatic   Cancer Maternal Aunt        either breast or ovarian    Cancer Maternal Uncle        unk type   Colon polyps Neg Hx    Stomach cancer Neg Hx    Esophageal cancer Neg Hx     Social History   Tobacco Use   Smoking status: Former    Packs/day: 0.25    Years: 1.00    Total pack years: 0.25    Types: Cigarettes    Quit date: 07/02/2013    Years since quitting: 8.4   Smokeless tobacco: Never  Vaping Use   Vaping Use: Never used  Substance Use Topics   Alcohol use: Not Currently   Drug use: Not Currently    Types: Marijuana    Comment: last used January 3    ROS   Objective:   Vitals: BP (!) 142/89 (BP Location: Left Arm)   Pulse 78   Temp 98.8 F (37.1 C) (Oral)   Resp 16   LMP 12/15/2021 (Exact Date)   SpO2  97%   Breastfeeding Unknown   Physical Exam Constitutional:      General: She is not in acute distress.    Appearance: Normal appearance. She is well-developed and normal weight. She is not ill-appearing, toxic-appearing or diaphoretic.  HENT:     Head: Normocephalic and atraumatic.  Right Ear: Tympanic membrane, ear canal and external ear normal. No drainage or tenderness. No middle ear effusion. There is no impacted cerumen. Tympanic membrane is not erythematous or bulging.     Left Ear: Tympanic membrane, ear canal and external ear normal. No drainage or tenderness.  No middle ear effusion. There is no impacted cerumen. Tympanic membrane is not erythematous or bulging.     Nose: Congestion and rhinorrhea present.     Mouth/Throat:     Mouth: Mucous membranes are moist. No oral lesions.     Pharynx: Posterior oropharyngeal erythema (with associated post-nasal drainage) present. No pharyngeal swelling, oropharyngeal exudate or uvula swelling.     Tonsils: No tonsillar exudate or tonsillar abscesses.  Eyes:     General: No scleral icterus.       Right eye: No discharge.        Left eye: No discharge.     Extraocular Movements: Extraocular movements intact.     Right eye: Normal extraocular motion.     Left eye: Normal extraocular motion.     Conjunctiva/sclera: Conjunctivae normal.  Cardiovascular:     Rate and Rhythm: Normal rate and regular rhythm.     Heart sounds: Normal heart sounds. No murmur heard.    No friction rub. No gallop.  Pulmonary:     Effort: Pulmonary effort is normal. No respiratory distress.     Breath sounds: No stridor. No wheezing, rhonchi or rales.  Chest:     Chest wall: No tenderness.  Musculoskeletal:     Cervical back: Normal range of motion and neck supple.  Lymphadenopathy:     Cervical: No cervical adenopathy.  Skin:    General: Skin is warm and dry.  Neurological:     General: No focal deficit present.     Mental Status: She is alert and  oriented to person, place, and time.  Psychiatric:        Mood and Affect: Mood normal.        Behavior: Behavior normal.     DG Chest 2 View  Result Date: 12/24/2021 CLINICAL DATA:  Cough. EXAM: CHEST - 2 VIEW COMPARISON:  May 18, 2021 FINDINGS: The heart size and mediastinal contours are within normal limits. Both lungs are clear. The visualized skeletal structures are unremarkable. IMPRESSION: No active cardiopulmonary disease. Electronically Signed   By: Ted Mcalpine M.D.   On: 12/24/2021 11:11    Assessment and Plan :   PDMP not reviewed this encounter.  1. Acute non-recurrent sinusitis of other sinus   2. Atypical chest pain   3. Seizure disorder (HCC)   4. Chronic bronchitis, unspecified chronic bronchitis type (HCC)     Advised that we avoid further use of steroids given her seizure disorder and recent seizure.  Recommended Augmentin to address sinusitis.  Patient refused a COVID test. Deferred imaging given clear cardiopulmonary exam, hemodynamically stable vital signs.  Use supportive care otherwise.  Maintain breathing treatments. Counseled patient on potential for adverse effects with medications prescribed/recommended today, ER and return-to-clinic precautions discussed, patient verbalized understanding.    Wallis Bamberg, PA-C 12/24/21 1314

## 2021-12-24 NOTE — ED Triage Notes (Addendum)
Hx of bronchitis. Symptoms started 11/3, improved over weekend with medications, Tuesday got worse. Cough, worse at night, has been seen by [facility], was given steroids, albuterol, and breathing treatments at night without improvement, was given phenergan to help with her nausea and sleep without improvement. Child has RSV. Also c/o right eye pain, drainage. Hsa problems swallowing pills

## 2022-04-04 ENCOUNTER — Ambulatory Visit (HOSPITAL_COMMUNITY)
Admission: EM | Admit: 2022-04-04 | Discharge: 2022-04-04 | Disposition: A | Discharge status: Home / Self Care | Payer: No Typology Code available for payment source | Attending: Emergency Medicine | Admitting: Emergency Medicine

## 2022-04-04 ENCOUNTER — Encounter (HOSPITAL_COMMUNITY): Payer: Self-pay

## 2022-04-04 DIAGNOSIS — R6889 Other general symptoms and signs: Secondary | ICD-10-CM

## 2022-04-04 DIAGNOSIS — R112 Nausea with vomiting, unspecified: Secondary | ICD-10-CM | POA: Diagnosis present

## 2022-04-04 DIAGNOSIS — J029 Acute pharyngitis, unspecified: Secondary | ICD-10-CM

## 2022-04-04 LAB — POCT RAPID STREP A, ED / UC: Streptococcus, Group A Screen (Direct): NEGATIVE

## 2022-04-04 MED ORDER — CYCLOBENZAPRINE HCL 10 MG PO TABS: 10.0000 mg | ORAL_TABLET | Freq: Two times a day (BID) | ORAL | 0 refills | Status: DC | PRN

## 2022-04-04 MED ORDER — ACETAMINOPHEN 160 MG/5ML PO SOLN
650.0000 mg | Freq: Once | ORAL | Status: AC
  Administered 2022-04-04: 650 mg via ORAL

## 2022-04-04 MED ORDER — ACETAMINOPHEN 160 MG/5ML PO SOLN
ORAL | Status: AC
  Filled 2022-04-04: qty 20.3

## 2022-04-04 MED ORDER — ACETAMINOPHEN 325 MG PO TABS: 650.0000 mg | ORAL_TABLET | Freq: Once | ORAL | Status: DC

## 2022-04-04 MED ORDER — ACETAMINOPHEN 160 MG/5ML PO SUSP: 320.0000 mg | Freq: Four times a day (QID) | ORAL | 0 refills | Status: DC | PRN

## 2022-04-04 NOTE — Discharge Instructions (Signed)
Try the muscle relaxer twice daily Tylenol every 6 hours for aches Take zofran 30 min before medicine to settle the stomach Drink lots of fluids!  If at any point you cannot tolerate fluids, please be seen in the emergency department.
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
## 2022-04-04 NOTE — ED Triage Notes (Addendum)
Patient c/o a non productive cough, generalized body aches, SOB, and states she feels like her throat is swollen x 2 days.  Patient states she has been drinking tea, using salt water gargles and taking Ibuprofen, Tylenol and states she took yesterday.

## 2022-04-07 LAB — CULTURE, GROUP A STREP (THRC)

## 2022-07-04 ENCOUNTER — Ambulatory Visit
Admission: EM | Admit: 2022-07-04 | Discharge: 2022-07-04 | Disposition: A | Discharge status: Home / Self Care | Payer: No Typology Code available for payment source | Attending: Nurse Practitioner | Admitting: Nurse Practitioner

## 2022-07-04 DIAGNOSIS — L299 Pruritus, unspecified: Secondary | ICD-10-CM | POA: Diagnosis not present

## 2022-07-04 DIAGNOSIS — T63301A Toxic effect of unspecified spider venom, accidental (unintentional), initial encounter: Secondary | ICD-10-CM | POA: Diagnosis not present

## 2022-07-04 MED ORDER — PREDNISONE 20 MG PO TABS: 20.0000 mg | ORAL_TABLET | Freq: Every day | ORAL | 0 refills | Status: AC

## 2022-07-04 MED ORDER — DEXAMETHASONE SODIUM PHOSPHATE 10 MG/ML IJ SOLN
10.0000 mg | INTRAMUSCULAR | Status: AC
  Administered 2022-07-04: 10 mg via INTRAMUSCULAR
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
## 2022-07-04 NOTE — Discharge Instructions (Signed)
You have been given an injection of Decadron 10 mg to help with the itching. You may start the prednisone tomorrow. May take over-the-counter Tylenol as needed for pain or discomfort while taking the prednisone. Cool compresses as needed to the left forearm to help with pain or swelling. Follow-up with your primary care physician if symptoms do not improve. Follow-up as needed.

## 2022-07-04 NOTE — ED Triage Notes (Signed)
Pt states she was bit Friday by a spider on her left wrist and is now having itching all over, pain in her joints and swelling that went away this morning. Taking tylenol.

## 2022-07-06 ENCOUNTER — Emergency Department (HOSPITAL_COMMUNITY): Payer: No Typology Code available for payment source

## 2022-07-06 ENCOUNTER — Encounter (HOSPITAL_COMMUNITY): Payer: Self-pay

## 2022-07-06 ENCOUNTER — Emergency Department (HOSPITAL_COMMUNITY)
Admission: EM | Admit: 2022-07-06 | Discharge: 2022-07-06 | Disposition: A | Discharge status: Home / Self Care | Payer: No Typology Code available for payment source | Attending: Emergency Medicine | Admitting: Emergency Medicine

## 2022-07-06 ENCOUNTER — Other Ambulatory Visit: Payer: Self-pay

## 2022-07-06 DIAGNOSIS — D5700 Hb-SS disease with crisis, unspecified: Secondary | ICD-10-CM | POA: Diagnosis not present

## 2022-07-06 DIAGNOSIS — M79632 Pain in left forearm: Secondary | ICD-10-CM | POA: Insufficient documentation

## 2022-07-06 DIAGNOSIS — R52 Pain, unspecified: Secondary | ICD-10-CM

## 2022-07-06 LAB — CBC WITH DIFFERENTIAL/PLATELET
Abs Immature Granulocytes: 0.02 10*3/uL (ref 0.00–0.07)
Basophils Absolute: 0.1 10*3/uL (ref 0.0–0.1)
Basophils Relative: 1 %
Eosinophils Absolute: 0.1 10*3/uL (ref 0.0–0.5)
Eosinophils Relative: 2 %
HCT: 33.9 % — ABNORMAL LOW (ref 36.0–46.0)
Hemoglobin: 11.3 g/dL — ABNORMAL LOW (ref 12.0–15.0)
Immature Granulocytes: 0 %
Lymphocytes Relative: 46 %
Lymphs Abs: 3.9 10*3/uL (ref 0.7–4.0)
MCH: 28 pg (ref 26.0–34.0)
MCHC: 33.3 g/dL (ref 30.0–36.0)
MCV: 84.1 fL (ref 80.0–100.0)
Monocytes Absolute: 0.4 10*3/uL (ref 0.1–1.0)
Monocytes Relative: 5 %
Neutro Abs: 3.8 10*3/uL (ref 1.7–7.7)
Neutrophils Relative %: 46 %
Platelets: 287 10*3/uL (ref 150–400)
RBC: 4.03 MIL/uL (ref 3.87–5.11)
RDW: 14.5 % (ref 11.5–15.5)
WBC: 8.3 10*3/uL (ref 4.0–10.5)
nRBC: 0 % (ref 0.0–0.2)

## 2022-07-06 LAB — COMPREHENSIVE METABOLIC PANEL
ALT: 12 U/L (ref 0–44)
AST: 15 U/L (ref 15–41)
Albumin: 3.5 g/dL (ref 3.5–5.0)
Alkaline Phosphatase: 76 U/L (ref 38–126)
Anion gap: 10 (ref 5–15)
BUN: 14 mg/dL (ref 6–20)
CO2: 22 mmol/L (ref 22–32)
Calcium: 9.3 mg/dL (ref 8.9–10.3)
Chloride: 107 mmol/L (ref 98–111)
Creatinine, Ser: 0.95 mg/dL (ref 0.44–1.00)
GFR, Estimated: >60 mL/min (ref >60)
Glucose, Bld: 88 mg/dL (ref 70–99)
Potassium: 4.1 mmol/L (ref 3.5–5.1)
Sodium: 139 mmol/L (ref 135–145)
Total Bilirubin: 0.6 mg/dL (ref 0.3–1.2)
Total Protein: 6.1 g/dL — ABNORMAL LOW (ref 6.5–8.1)

## 2022-07-06 LAB — RETICULOCYTES
Immature Retic Fract: 17.7 % — ABNORMAL HIGH (ref 2.3–15.9)
RBC.: 3.99 MIL/uL (ref 3.87–5.11)
Retic Count, Absolute: 89 10*3/uL (ref 19.0–186.0)
Retic Ct Pct: 2.2 % (ref 0.4–3.1)

## 2022-07-06 LAB — I-STAT BETA HCG BLOOD, ED (MC, WL, AP ONLY): I-stat hCG, quantitative: <5 m[IU]/mL (ref <5)

## 2022-07-06 MED ORDER — HYDROMORPHONE HCL 1 MG/ML IJ SOLN
0.5000 mg | INTRAMUSCULAR | Status: DC
  Filled 2022-07-06: qty 1

## 2022-07-06 MED ORDER — FENTANYL CITRATE PF 50 MCG/ML IJ SOSY
100.0000 ug | PREFILLED_SYRINGE | Freq: Once | INTRAMUSCULAR | Status: AC
  Administered 2022-07-06: 100 ug via INTRAVENOUS
  Filled 2022-07-06: qty 2

## 2022-07-06 MED ORDER — LAMOTRIGINE 25 MG PO TABS
200.0000 mg | ORAL_TABLET | Freq: Once | ORAL | Status: AC
  Administered 2022-07-06: 200 mg via ORAL
  Filled 2022-07-06: qty 8

## 2022-07-06 MED ORDER — DIPHENHYDRAMINE HCL 50 MG/ML IJ SOLN
12.5000 mg | Freq: Once | INTRAMUSCULAR | Status: AC
  Administered 2022-07-06: 12.5 mg via INTRAVENOUS
  Filled 2022-07-06: qty 1

## 2022-07-06 MED ORDER — HYDROMORPHONE HCL 1 MG/ML IJ SOLN
0.5000 mg | INTRAMUSCULAR | Status: AC
  Administered 2022-07-06: 0.5 mg via INTRAVENOUS

## 2022-07-06 MED ORDER — HYDROXYZINE HCL 25 MG PO TABS
50.0000 mg | ORAL_TABLET | Freq: Once | ORAL | Status: AC
  Administered 2022-07-06: 50 mg via ORAL
  Filled 2022-07-06: qty 2

## 2022-07-06 MED ORDER — HYDROMORPHONE HCL 1 MG/ML IJ SOLN
1.0000 mg | Freq: Once | INTRAMUSCULAR | Status: AC
  Administered 2022-07-06: 1 mg via INTRAVENOUS
  Filled 2022-07-06: qty 1

## 2022-07-06 MED ORDER — OXYCODONE-ACETAMINOPHEN 5-325 MG PO TABS: 1.0000 | ORAL_TABLET | Freq: Four times a day (QID) | ORAL | 0 refills | Status: DC | PRN

## 2022-07-06 MED ORDER — SODIUM CHLORIDE 0.9 % IV SOLN
12.5000 mg | Freq: Four times a day (QID) | INTRAVENOUS | Status: DC | PRN
  Administered 2022-07-06: 12.5 mg via INTRAVENOUS
  Filled 2022-07-06: qty 0.5

## 2022-07-06 MED ORDER — FENTANYL CITRATE PF 50 MCG/ML IJ SOSY
50.0000 ug | PREFILLED_SYRINGE | Freq: Once | INTRAMUSCULAR | Status: AC
  Administered 2022-07-06: 50 ug via INTRAVENOUS
  Filled 2022-07-06: qty 1

## 2022-07-06 MED ORDER — LEVETIRACETAM IN NACL 1000 MG/100ML IV SOLN
1000.0000 mg | Freq: Once | INTRAVENOUS | Status: AC
  Administered 2022-07-06: 1000 mg via INTRAVENOUS
  Filled 2022-07-06: qty 100

## 2022-07-06 MED ORDER — LORAZEPAM 1 MG PO TABS
1.0000 mg | ORAL_TABLET | Freq: Once | ORAL | Status: AC
  Administered 2022-07-06: 1 mg via ORAL
  Filled 2022-07-06: qty 1

## 2022-07-06 NOTE — Discharge Instructions (Addendum)
You came to the emergency department today with concern for sickle cell pain crises.  Your lab work is reassuring.  As we discussed, I will be important for you to follow-up for better chronic pain management.  If you are unable to get [facility] sooner, there are some other offices you may call on these discharge papers.  There is also sickle cell clinic at the [facility] long hospital if you were to ever need it.

## 2022-07-06 NOTE — ED Triage Notes (Signed)
Pt came in via POV d/t a bug bite 4 days ago. Her Lt FA was swollen all the way down to her hand. She went to UC & was Tx, her body began itching prior to & worsened after given the meds for the bug bite at [facility]. She reports having Sickle Cell & not sure if this is making her feel this way d/t it starting a crisis. A/Ox4, 7/10 pain all over her body right now.

## 2022-07-06 NOTE — ED Notes (Signed)
Got patient on the monitor patient is resting with call bell in reach  ?

## 2022-07-06 NOTE — ED Provider Notes (Signed)
[place] EMERGENCY DEPARTMENT AT [facility] Provider Note   CSN: [id number] Arrival date & time: 07/06/22  1110     History  Chief Complaint  Patient presents with   Bug Bite   Sickle Cell Pain Crisis    Leah Shea is a 37 y.o. female with a past medical history of sickle cell disease presenting today with concern for a potential pain crises.  She tells me that on Saturday she was bit by a spider and her left arm started to swell.  Also endorsed that it was itchy.  She was taking Benadryl but continued to feel "swollen and burning" all over her body.  Reports that it was aching and sharp in her joints as well which is normal for her sickle cell pain crises.  She went to urgent care 2 days ago and they treated her with a steroid shot and Benadryl.  She reports that this did not seem to help her very much.  She takes her oxycodone and hydrocodone outpatient but is [facility] to have a more consistent sickle cell pain regimen.  She also tells me that she has a history of seizures and is supposed to be on Keppra and Lamictal.  She has not had these medications since Saturday when she started to feel poorly.  She says she feels the auras that she sometimes gets when she is going to have a seizure to include headache, abnormal taste and smells.   Sickle Cell Pain Crisis      Home Medications Prior to Admission medications   Medication Sig Start Date End Date Taking? Authorizing Provider  acetaminophen (TYLENOL CHILDRENS) 160 MG/5ML suspension Take 10 mLs (320 mg total) by mouth every 6 (six) hours as needed. 04/04/22   Rising, Lurena Joiner, PA-C  cyclobenzaprine (FLEXERIL) 10 MG tablet Take 1 tablet (10 mg total) by mouth 2 (two) times daily as needed. 04/04/22   Rising, Lurena Joiner, PA-C  docusate sodium (COLACE) 100 MG capsule Take 1 capsule (100 mg total) by mouth 2 (two) times daily as needed. 10/08/21   Calvert Cantor, CNM   escitalopram (LEXAPRO) 20 MG tablet TAKE ONE TABLET BY MOUTH ONCE A DAY FOR DEPRESSION AND ANXIETY 11/10/20   [provider]  lamoTRIgine (LAMICTAL) 200 MG tablet Take 200 mg by mouth 2 (two) times daily. 06/03/21   [provider]  levETIRAcetam (KEPPRA) 500 MG tablet Take 1,000 mg by mouth 2 (two) times daily. 06/03/21   [provider]  LORazepam (ATIVAN) 0.5 MG tablet Take 1 tablet (0.5 mg total) by mouth every 6 (six) hours as needed for anxiety or sleep. 10/02/20   Milas Hock, MD  NIFEdipine (ADALAT CC) 30 MG 24 hr tablet Take 30 mg by mouth daily.    [provider]  omeprazole (PRILOSEC) 20 MG capsule Take 1 capsule (20 mg total) by mouth daily. 05/05/21   Jenel Lucks, MD  ondansetron (ZOFRAN-ODT) 4 MG disintegrating tablet Take 1 tablet (4 mg total) by mouth every 4 (four) hours as needed for nausea or vomiting. 09/22/21   Rolm Bookbinder, CNM  polyethylene glycol (MIRALAX) 17 g packet Take 17 g by mouth daily. 10/08/21   Calvert Cantor, CNM  predniSONE (DELTASONE) 20 MG tablet Take 1 tablet (20 mg total) by mouth daily with breakfast for 7 days. 07/04/22 07/11/22  Leath-Warren, Sadie Haber, NP  promethazine (PHENERGAN) 25 MG tablet Take 1 tablet (25 mg total)  by mouth every 8 (eight) hours as needed for nausea or vomiting. 09/22/21   Rolm Bookbinder, CNM  scopolamine (TRANSDERM-SCOP) 1 MG/3DAYS Place 1 patch (1.5 mg total) onto the skin every 3 (three) days. 08/30/21   Rolm Bookbinder, CNM  SUMAtriptan (IMITREX) 6 MG/0.5ML SOSY injection INJECT 6MG  (ONE SINGLE DOSE AUTOINJECTOR) SUBCUTANEOUSLY TWICE A DAY AS NEEDED FOR MIGRAINE 06/03/21   [provider]      Allergies    Mushroom extract complex, Other, Nsaids, Prenatal vitamins, Compazine [prochlorperazine], Reglan [metoclopramide], and Sulfa antibiotics    Review of Systems   Review of Systems  Physical Exam Updated Vital Signs BP (!) 151/101 (BP Location: Right Arm)   Pulse  79   Temp 98.8 F (37.1 C) (Oral)   Resp 17   Ht 5\' 6"  (1.676 m)   Wt 97.5 kg   LMP 07/02/2022 (Approximate)   SpO2 95%   BMI 34.69 kg/m  Physical Exam Vitals and nursing note reviewed.  Constitutional:      General: She is not in acute distress.    Appearance: Normal appearance. She is not ill-appearing.  HENT:     Head: Normocephalic and atraumatic.  Eyes:     General: No scleral icterus.    Conjunctiva/sclera: Conjunctivae normal.  Pulmonary:     Effort: Pulmonary effort is normal. No respiratory distress.  Musculoskeletal:     Comments: No swelling of the bilateral upper extremities.  Strong radial pulses.  No bite mark but does have point tenderness on the left forearm.  No induration or signs of cellulitis  Skin:    Findings: No rash.  Neurological:     Mental Status: She is alert.  Psychiatric:        Mood and Affect: Mood normal.     ED Results / Procedures / Treatments   Labs (all labs ordered are listed, but only abnormal results are displayed) Labs Reviewed  CBC WITH DIFFERENTIAL/PLATELET - Abnormal; Notable for the following components:      Result Value   Hemoglobin 11.3 (*)    HCT 33.9 (*)    All other components within normal limits  RETICULOCYTES - Abnormal; Notable for the following components:   Immature Retic Fract 17.7 (*)    All other components within normal limits  COMPREHENSIVE METABOLIC PANEL - Abnormal; Notable for the following components:   Total Protein 6.1 (*)    All other components within normal limits  I-STAT BETA HCG BLOOD, ED ([facility], [facility], [facility] ONLY)  I-STAT BETA HCG BLOOD, ED ([facility], [facility], [facility] ONLY)    EKG None  Radiology DG Chest Portable 1 View  Result Date: 07/06/2022 CLINICAL DATA:  Provided history: Chest pain. History of sickle cell trait. EXAM: PORTABLE CHEST 1 VIEW COMPARISON:  Prior chest radiographs 12/24/2021 and earlier. FINDINGS: Heart size within normal limits. Minimal ill-defined opacity within the lateral left lung base  with an appearance most consistent with atelectasis. No appreciable airspace consolidation. No evidence of pleural effusion or pneumothorax. No acute osseous abnormality identified. IMPRESSION: Minimal atelectasis within the left lung base. Otherwise, no evidence of active cardiopulmonary disease. Electronically Signed   By: Jackey Loge D.O.   On: 07/06/2022 15:46   DG Pelvis Portable  Result Date: 07/06/2022 CLINICAL DATA:  Bilateral hip pain. EXAM: PORTABLE PELVIS 1-2 VIEWS COMPARISON:  None Available. FINDINGS: There is no evidence of pelvic fracture or diastasis. No pelvic bone lesions are seen. IMPRESSION: Negative. Electronically Signed   By: Zenda Alpers.D.  On: 07/06/2022 15:44    Procedures Procedures   Medications Ordered in ED Medications  lamoTRIgine (LAMICTAL) tablet 200 mg (has no administration in time range)  levETIRAcetam (KEPPRA) IVPB 1000 mg/100 mL premix (has no administration in time range)  diphenhydrAMINE (BENADRYL) injection 12.5 mg (has no administration in time range)  promethazine (PHENERGAN) 12.5 mg in sodium chloride 0.9 % 50 mL IVPB (has no administration in time range)  HYDROmorphone (DILAUDID) injection 0.5 mg (has no administration in time range)  HYDROmorphone (DILAUDID) injection 0.5 mg (has no administration in time range)    ED Course/ Medical Decision Making/ A&P Clinical Course as of 07/06/22 1517  Wed Jul 06, 2022  1516 I went and updated the patient on her CBC at bedside.  We also discussed that she continues to be in pain after 2 doses of Dilaudid.  We will try fentanyl.  She is very tearful and anxious so I will also give her her Ativan. [MR]    Clinical Course User Index [MR] Indya Oliveria, Gabriel Cirri, PA-C                             Medical Decision Making Amount and/or Complexity of Data Reviewed Labs: ordered. Radiology: ordered.  Risk Prescription drug management.     Past Medical History / Co-morbidities / Social  History: Anxiety, previous alcohol and marijuana abuse, sickle cell trait   Additional history: Per chart review patient has a history of seizure disorder, sleep apnea, PTSD and sickle cell trait   Physical Exam: Pertinent physical exam findings include No noticeable swelling to the bilateral upper extremities.  Point tenderness to the left forearm Tearful and anxious  Lab Tests: I ordered, and personally interpreted labs.  The pertinent results include: Hemoglobin 11.3, normal reticulocytes   Imaging Studies: I ordered and independently visualized and interpreted chest and pelvic x-rays and I agree with the radiologist that there are no acute findings   Cardiac Monitoring:  The patient was maintained on a cardiac monitor.  I viewed and interpreted the cardiac monitored which showed an underlying rhythm of: NSR   Medications: Originally given Dilaudid, Benadryl, Lamictal and Keppra.  She reports that Dilaudid helped her for a little bit however her pain came back.  Given 1 of Dilaudid instead of 0.5.  She continued to complain of pain.  Given fentanyl.  This helped her pain more than the Dilaudid.  Patient was given Ativan due to anxiety   MDM/Disposition: This is a 37 year old female presenting today with concern for a bug bite leading her to a sickle cell pain crisis.  On her physical exam I am unable to see evidence of a bug bite.  She did have point tenderness to the left forearm.  She originally told me she had sickle cell disease however per chart review she only has sickle cell trait.  I went and discussed this with her at bedside and she said that she has frequently been treated by the [place] as though she has sickle cell disease as she does have pain crises.  Per her history and PDMP review patient was previously on oxycodone however this has not been filled since 2023.  Today her workup is reassuring.  X-rays were ordered of her chest and her hips where she continued to complain of  pain and these were unremarkable.  Patient received her antiepileptics because she has been off of them for the past couple of days.  Vital  signs are stable and she will be discharged home to continue her seizure medications as prescribed and follow-up with the [place] for any sickle cell pain concerns.  She is agreeable to the plan.     Final Clinical Impression(s) / ED Diagnoses Final diagnoses:  Pain    Rx / DC Orders ED Discharge Orders          Ordered    oxyCODONE-acetaminophen (PERCOCET/ROXICET) 5-325 MG tablet  Every 6 hours PRN        07/06/22 1858           Results and diagnoses were explained to the patient. Return precautions discussed in full. Patient had no additional questions and expressed complete understanding.   This chart was dictated using voice recognition software.  Despite best efforts to proofread,  errors can occur which can change the documentation meaning.     Woodroe Chen 07/06/22 1952    Gerhard Munch, MD 07/07/22 [phone]

## 2022-07-06 NOTE — ED Notes (Signed)
Pt in bed, pt remains tearful, pt requests more pain med, PA notified

## 2022-08-19 ENCOUNTER — Other Ambulatory Visit: Payer: Self-pay | Admitting: Physician Assistant

## 2022-08-19 DIAGNOSIS — N644 Mastodynia: Secondary | ICD-10-CM

## 2022-08-26 ENCOUNTER — Other Ambulatory Visit: Payer: Self-pay | Admitting: Physician Assistant

## 2022-08-26 DIAGNOSIS — N644 Mastodynia: Secondary | ICD-10-CM

## 2022-08-26 DIAGNOSIS — E221 Hyperprolactinemia: Secondary | ICD-10-CM

## 2022-08-30 ENCOUNTER — Ambulatory Visit
Admission: RE | Admit: 2022-08-30 | Discharge: 2022-08-30 | Disposition: A | Discharge status: Home / Self Care | Payer: Non-veteran care | Source: Ambulatory Visit | Attending: Physician Assistant | Admitting: Physician Assistant

## 2022-08-30 ENCOUNTER — Ambulatory Visit
Admission: RE | Admit: 2022-08-30 | Discharge: 2022-08-30 | Disposition: A | Discharge status: Home / Self Care | Payer: No Typology Code available for payment source | Source: Ambulatory Visit | Attending: Physician Assistant | Admitting: Physician Assistant

## 2022-08-30 DIAGNOSIS — N644 Mastodynia: Secondary | ICD-10-CM

## 2022-08-31 ENCOUNTER — Other Ambulatory Visit: Payer: Self-pay | Admitting: Physician Assistant

## 2022-08-31 DIAGNOSIS — N6042 Mammary duct ectasia of left breast: Secondary | ICD-10-CM

## 2022-09-24 ENCOUNTER — Ambulatory Visit
Admission: RE | Admit: 2022-09-24 | Discharge: 2022-09-24 | Disposition: A | Discharge status: Home / Self Care | Payer: Medicaid Other | Source: Ambulatory Visit | Attending: Physician Assistant | Admitting: Physician Assistant

## 2022-09-24 DIAGNOSIS — E221 Hyperprolactinemia: Secondary | ICD-10-CM

## 2022-09-24 MED ORDER — GADOPICLENOL 0.5 MMOL/ML IV SOLN
10.0000 mL | Freq: Once | INTRAVENOUS | Status: AC | PRN
  Administered 2022-09-24: 10 mL via INTRAVENOUS

## 2022-09-26 ENCOUNTER — Ambulatory Visit
Admission: RE | Admit: 2022-09-26 | Discharge: 2022-09-26 | Disposition: A | Discharge status: Home / Self Care | Payer: Medicaid Other | Source: Ambulatory Visit | Attending: Physician Assistant | Admitting: Physician Assistant

## 2022-09-26 DIAGNOSIS — N6042 Mammary duct ectasia of left breast: Secondary | ICD-10-CM

## 2022-09-26 MED ORDER — GADOPICLENOL 0.5 MMOL/ML IV SOLN
10.0000 mL | Freq: Once | INTRAVENOUS | Status: AC | PRN
  Administered 2022-09-26: 10 mL via INTRAVENOUS

## 2022-09-27 ENCOUNTER — Other Ambulatory Visit: Payer: Self-pay | Admitting: Physician Assistant

## 2022-09-27 DIAGNOSIS — R9389 Abnormal findings on diagnostic imaging of other specified body structures: Secondary | ICD-10-CM

## 2022-09-30 ENCOUNTER — Ambulatory Visit
Admission: RE | Admit: 2022-09-30 | Discharge: 2022-09-30 | Disposition: A | Discharge status: Home / Self Care | Payer: No Typology Code available for payment source | Source: Ambulatory Visit | Attending: Physician Assistant | Admitting: Physician Assistant

## 2022-09-30 DIAGNOSIS — R928 Other abnormal and inconclusive findings on diagnostic imaging of breast: Secondary | ICD-10-CM

## 2022-09-30 DIAGNOSIS — R9389 Abnormal findings on diagnostic imaging of other specified body structures: Secondary | ICD-10-CM

## 2022-09-30 MED ORDER — GADOPICLENOL 0.5 MMOL/ML IV SOLN
10.0000 mL | Freq: Once | INTRAVENOUS | Status: AC | PRN
  Administered 2022-09-30: 10 mL via INTRAVENOUS

## 2022-10-02 ENCOUNTER — Other Ambulatory Visit: Payer: Self-pay | Admitting: Obstetrics and Gynecology

## 2022-10-10 ENCOUNTER — Other Ambulatory Visit: Payer: Self-pay

## 2022-10-12 ENCOUNTER — Other Ambulatory Visit: Payer: Self-pay

## 2022-10-14 ENCOUNTER — Other Ambulatory Visit: Payer: Self-pay

## 2022-10-18 ENCOUNTER — Ambulatory Visit: Payer: Self-pay | Admitting: General Surgery

## 2022-10-21 ENCOUNTER — Ambulatory Visit (HOSPITAL_COMMUNITY)
Admission: EM | Admit: 2022-10-21 | Discharge: 2022-10-21 | Disposition: A | Discharge status: Home / Self Care | Payer: No Typology Code available for payment source | Attending: Physician Assistant | Admitting: Physician Assistant

## 2022-10-21 ENCOUNTER — Encounter (HOSPITAL_COMMUNITY): Payer: Self-pay | Admitting: Emergency Medicine

## 2022-10-21 DIAGNOSIS — Z1152 Encounter for screening for COVID-19: Secondary | ICD-10-CM | POA: Insufficient documentation

## 2022-10-21 DIAGNOSIS — R6889 Other general symptoms and signs: Secondary | ICD-10-CM | POA: Diagnosis present

## 2022-10-21 LAB — POCT INFLUENZA A/B
Influenza A, POC: NEGATIVE
Influenza B, POC: NEGATIVE

## 2022-10-21 NOTE — Discharge Instructions (Signed)
Your flu test is negative Your COVID test is pending, will call if test results are positive  Recommend rest, drink plenty of fluids

## 2022-10-21 NOTE — ED Triage Notes (Signed)
Pt reports since last night having congestion, headache, chills, ear pain, throat pain, dizziness.
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
## 2022-10-22 LAB — SARS CORONAVIRUS 2 (TAT 6-24 HRS): SARS Coronavirus 2: NEGATIVE

## 2022-10-31 ENCOUNTER — Ambulatory Visit (INDEPENDENT_AMBULATORY_CARE_PROVIDER_SITE_OTHER): Payer: Medicaid Other | Admitting: Podiatry

## 2022-10-31 DIAGNOSIS — Z91199 Patient's noncompliance with other medical treatment and regimen due to unspecified reason: Secondary | ICD-10-CM

## 2022-10-31 NOTE — Progress Notes (Signed)
   Complete physical exam  Patient: Leah Shea   DOB: 12/04/1998   37 y.o. Female  MRN: [id number]  Subjective:    No chief complaint on file.   Leah Shea is a 37 y.o. female who presents today for a complete physical exam. She reports consuming a {diet types:17450} diet. {types:19826} She generally feels {DESC; WELL/FAIRLY WELL/POORLY:18703}. She reports sleeping {DESC; WELL/FAIRLY WELL/POORLY:18703}. She {does/does not:200015} have additional problems to discuss today.    Most recent fall risk assessment:    08/11/2021   10:42 AM  Fall Risk   Falls in the past year? 0  Number falls in past yr: 0  Injury with Fall? 0  Risk for fall due to : No Fall Risks  Follow up Falls evaluation completed     Most recent depression screenings:    08/11/2021   10:42 AM 07/02/2020   10:46 AM  PHQ 2/9 Scores  PHQ - 2 Score 0 0  PHQ- 9 Score 5     {VISON DENTAL STD PSA (Optional):27386}  {History (Optional):23778}  Patient Care Team: Jessup, Joy, NP as PCP - General (Nurse Practitioner)   Outpatient Medications Prior to Visit  Medication Sig   fluticasone (FLONASE) 50 MCG/ACT nasal spray Place 2 sprays into both nostrils in the morning and at bedtime. After 7 days, reduce to once daily.   norgestimate-ethinyl estradiol (SPRINTEC 28) 0.25-35 MG-MCG tablet Take 1 tablet by mouth daily.   Nystatin POWD Apply liberally to affected area 2 times per day   spironolactone (ALDACTONE) 100 MG tablet Take 1 tablet (100 mg total) by mouth daily.   No facility-administered medications prior to visit.    ROS        Objective:     There were no vitals taken for this visit. {Vitals History (Optional):23777}  Physical Exam   No results found for any visits on 09/16/21. {Show previous labs (optional):23779}    Assessment & Plan:    Routine Health Maintenance and Physical Exam  Immunization History  Administered Date(s) Administered   DTaP 02/17/1999, 04/15/1999,  06/24/1999, 03/09/2000, 09/23/2003   Hepatitis A 07/20/2007, 07/25/2008   Hepatitis B 12/05/1998, 01/12/1999, 06/24/1999   HiB (PRP-OMP) 02/17/1999, 04/15/1999, 06/24/1999, 03/09/2000   IPV 02/17/1999, 04/15/1999, 12/13/1999, 09/23/2003   Influenza,inj,Quad PF,6+ Mos 10/25/2013   Influenza-Unspecified 01/25/2012   MMR 12/12/2000, 09/23/2003   Meningococcal Polysaccharide 07/25/2011   Pneumococcal Conjugate-13 03/09/2000   Pneumococcal-Unspecified 06/24/1999, 09/07/1999   Tdap 07/25/2011   Varicella 12/13/1999, 07/20/2007    Health Maintenance  Topic Date Due   HIV Screening  Never done   Hepatitis C Screening  Never done   INFLUENZA VACCINE  09/14/2021   PAP-Cervical Cytology Screening  09/16/2021 (Originally 12/04/2019)   PAP SMEAR-Modifier  09/16/2021 (Originally 12/04/2019)   TETANUS/TDAP  09/16/2021 (Originally 07/24/2021)   HPV VACCINES  Discontinued   COVID-19 Vaccine  Discontinued    Discussed health benefits of physical activity, and encouraged her to engage in regular exercise appropriate for her age and condition.  Problem List Items Addressed This Visit   None Visit Diagnoses     Annual physical exam    -  Primary   Cervical cancer screening       Need for Tdap vaccination          No follow-ups on file.     Joy Jessup, NP   

## 2022-10-31 NOTE — Addendum Note (Signed)
Addended by: Felecia Shelling on: 10/31/2022 07:06 PM   Modules accepted: Level of Service

## 2022-11-09 ENCOUNTER — Encounter: Payer: Self-pay | Admitting: Plastic Surgery

## 2022-11-09 ENCOUNTER — Ambulatory Visit (INDEPENDENT_AMBULATORY_CARE_PROVIDER_SITE_OTHER): Payer: Medicaid Other | Admitting: Plastic Surgery

## 2022-11-09 VITALS — BP 144/95 | HR 80 | Ht 65.5 in

## 2022-11-09 DIAGNOSIS — N6452 Nipple discharge: Secondary | ICD-10-CM | POA: Diagnosis not present

## 2022-11-09 DIAGNOSIS — G40219 Localization-related (focal) (partial) symptomatic epilepsy and epileptic syndromes with complex partial seizures, intractable, without status epilepticus: Secondary | ICD-10-CM

## 2022-11-09 NOTE — Progress Notes (Signed)
Referring Provider Leah Miner, MD [address] [place],  [place] [address]   CC:  Chief Complaint  Patient presents with   Breast Reconstruction   Advice Only      Leah Shea is an 37 y.o. female.  HPI: Leah Shea is a 37 year old female who is currently being evaluated for bloody nipple discharge.  This was initially noted on the left but she now has bilateral nipple discharge.  She has seen Leah Shea and is considering bilateral mastectomies.  On her workup the patient has had a mammogram and an MRI.  The MRI shows normal breast parenchyma on the left and a concerning non-mass focus on the right. 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  She is scheduled to see a neurosurgeon next week. She is interested in bilateral breast reconstruction after mastectomy.  Allergies  Allergen Reactions   Mushroom Extract Complex Anaphylaxis and Hives   Other Anaphylaxis and Hives    Mushrooms   Nsaids Hives, Swelling and Other (See Comments)    "Body burns" Tolerated celecoxib 09/2020    Prenatal Vitamins    Compazine [Prochlorperazine] Anxiety   Reglan [Metoclopramide] Anxiety   Sulfa Antibiotics Other (See Comments)    Outpatient Encounter Medications as of 11/09/2022  Medication Sig   acetaminophen (TYLENOL CHILDRENS) 160 MG/5ML suspension Take 10 mLs (320 mg total) by mouth every 6 (six) hours as needed.   cyclobenzaprine (FLEXERIL) 10 MG tablet Take 1 tablet (10 mg total) by mouth 2 (two) times daily as needed.   docusate sodium (COLACE) 100 MG capsule Take 1 capsule (100 mg total) by mouth 2 (two) times daily as needed.   escitalopram (LEXAPRO) 20 MG tablet TAKE ONE TABLET BY MOUTH ONCE A DAY FOR DEPRESSION AND ANXIETY   lamoTRIgine (LAMICTAL) 200 MG tablet Take 200 mg by mouth 2 (two) times daily.   levETIRAcetam (KEPPRA) 500 MG tablet Take 1,000 mg by mouth 2 (two) times daily.   LORazepam (ATIVAN) 0.5 MG  tablet Take 1 tablet (0.5 mg total) by mouth every 6 (six) hours as needed for anxiety or sleep.   NIFEdipine (ADALAT CC) 30 MG 24 hr tablet Take 30 mg by mouth daily.   omeprazole (PRILOSEC) 20 MG capsule Take 1 capsule (20 mg total) by mouth daily.   ondansetron (ZOFRAN-ODT) 4 MG disintegrating tablet Take 1 tablet (4 mg total) by mouth every 4 (four) hours as needed for nausea or vomiting.   oxyCODONE-acetaminophen (PERCOCET/ROXICET) 5-325 MG tablet Take 1 tablet by mouth every 6 (six) hours as needed for severe pain. (Patient taking differently: Take 1 tablet by mouth every 6 (six) hours as needed for severe pain. 10-325 dose change per patient)   polyethylene glycol (MIRALAX) 17 g packet Take 17 g by mouth daily.   promethazine (PHENERGAN) 25 MG tablet Take 1 tablet (25 mg total) by mouth every 8 (eight) hours as needed for nausea or vomiting.   scopolamine (TRANSDERM-SCOP) 1 MG/3DAYS Place 1 patch (1.5 mg total) onto the skin every 3 (three) days.   SUMAtriptan (IMITREX) 6 MG/0.5ML SOSY injection INJECT 6MG  (ONE SINGLE DOSE AUTOINJECTOR) SUBCUTANEOUSLY TWICE A DAY AS NEEDED FOR MIGRAINE   No facility-administered encounter medications on file as of 11/09/2022.     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   Transferred from [facility] problem list   IBS (irritable bowel syndrome)    Nausea and vomiting during pregnancy 07/25/2020   PTSD (post-traumatic stress disorder)    Shortness of breath    with exertion or panic attack   Sickle cell trait (HCC)    Sleep apnea    awaiting a sleep study to be done at [facility] ([facility]    Past Surgical History:  Procedure Laterality Date   ABDOMINAL  HERNIA REPAIR  10/08/2020   BREAST SURGERY Bilateral    reductions   CESAREAN SECTION  2010, [id number],4696   x 3   CESAREAN SECTION N/A 09/28/2020   Procedure: CESAREAN SECTION;  Surgeon: Venora Maples, MD;  Location: [facility] LD ORS;  Service: Obstetrics;  Laterality: N/A;   EYE SURGERY     x 10 as a child   LAPAROTOMY N/A 10/08/2020   Procedure: EXPLORATORY LAPAROTOMY WITH CLOSURE ABDOMINAL WALL DEFECT POSSIBLE BOWEL RESECTION;  Surgeon: Harriette Bouillon, MD;  Location: [facility] OR;  Service: General;  Laterality: N/A;   PILONIDAL CYST EXCISION  2020   REDUCTION MAMMAPLASTY     TONSILLECTOMY N/A 10/07/2013   Procedure: TONSILLECTOMY;  Surgeon: Christia Reading, MD;  Location: [facility] OR;  Service: ENT;  Laterality: N/A;   TUMOR REMOVAL     tumor removed from back    Family History  Problem Relation Age of Onset   Breast cancer Mother    Clotting disorder Mother    Diabetes Mother    Heart disease Mother    Breast cancer Maternal Grandmother    Diabetes Maternal Grandmother    Prostate cancer Maternal Grandfather        metastatic   Pancreatic cancer Maternal Grandfather    Colon cancer Maternal Grandfather    Diabetes Maternal Grandfather    Breast cancer Maternal Aunt 36       metastatic   Cancer Maternal Aunt        either breast or ovarian    Cancer Maternal Uncle        unk type   Colon polyps Neg Hx    Stomach cancer Neg Hx    Esophageal cancer Neg Hx     Social History   Social History Narrative   Not on file     Review of Systems General: Denies fevers, chills, weight loss CV: Denies chest pain, shortness of breath, palpitations Endocrine: Followed for a prolactinoma since 2018. Neurologic: History of focal seizures, patient feels that this is due to her pituitary lesion Breast: Bilateral bloody nipple discharge, no palpable abnormalities on self-exam  Physical Exam    11/09/2022    8:15 AM 10/21/2022    5:33 PM 07/06/2022    7:15 PM  Vitals with BMI  Height 5' 5.5"     Systolic 144 125 295  Diastolic 95 93 93  Pulse 80 98 72    General:  No acute distress,  Alert and oriented, Non-Toxic, Normal speech and affect Breast: Patient has bilateral pendulous breasts with significant asymmetry.  The left is greater than the right.  She has grade 3 ptosis.  The left breast is approximately 20 cm wide with a base width of 14 cm the right breast is approximately 17 cm wide with a base width  of 14 cm. Mammogram: July 2024 BI-RADS 1, MRI August 2024 BI-RADS 4 with a suspicious finding in the right breast Assessment/Plan Bilateral bloody nipple discharge: Patient is currently being evaluated by Leah Shea for bilateral mastectomies.  She is interested in bilateral breast reconstruction.  We had an extensive discussion on the options for breast reconstruction.  Specifically we discussed autologous tissue and D IEP flap reconstruction.  I showed her the anatomy and the type of reconstruction that would be done.  She is not interested in this.  We moved to discussion of tissue expander and implant-based reconstruction.  I showed her a tissue expander and described how the tissue expander would be placed behind the pectoralis muscle and covered with ADM.  We discussed how tissue expansion occurs and what to expect during tissue expansion.  We then discussed removal of the tissue expander and placement of permanent implants.  She was given an opportunity to inspect a breast implant.  We discussed the extremely low risk of breast implant associated anaplastic large cell lymphoma with silicone implants.  We discussed the risks of the procedure including the risks of infection and seroma formation.  We discussed the need for drains immediately postoperatively.  We discussed the fact that often there are additional procedures needed to achieve symmetry and the aesthetic outcome that the patient would like.  We also discussed the possibility of nipple tattooing if the patient has a mastectomy  with the nipples excised.  All questions were answered to the patient's satisfaction.  Photographs were obtained today with her consent.  Will submit her for placement of bilateral tissue expanders at her request. She will need clearance by her neurologist prior to surgery.  Specifically will need to know what should be done for her focal seizures including adjustments to her Keppra.  Santiago Glad 11/09/2022, 12:47 PM

## 2022-11-10 ENCOUNTER — Telehealth: Payer: Self-pay

## 2022-11-10 NOTE — Telephone Encounter (Signed)
Faxed surgical release form to Terrence Dupont, NP for neurological clearance. Received confirmation of receipt. I will scan into pt's chart.

## 2022-11-16 ENCOUNTER — Institutional Professional Consult (permissible substitution): Payer: No Typology Code available for payment source | Admitting: Plastic Surgery

## 2022-11-16 ENCOUNTER — Ambulatory Visit: Payer: Medicaid Other | Admitting: Podiatry

## 2022-11-17 ENCOUNTER — Ambulatory Visit: Payer: Self-pay | Admitting: General Surgery

## 2022-11-21 ENCOUNTER — Emergency Department (HOSPITAL_COMMUNITY): Payer: No Typology Code available for payment source

## 2022-11-21 ENCOUNTER — Emergency Department (HOSPITAL_COMMUNITY)
Admission: EM | Admit: 2022-11-21 | Discharge: 2022-11-21 | Disposition: A | Discharge status: Home / Self Care | Payer: No Typology Code available for payment source | Attending: Emergency Medicine | Admitting: Emergency Medicine

## 2022-11-21 ENCOUNTER — Other Ambulatory Visit: Payer: Self-pay

## 2022-11-21 ENCOUNTER — Encounter (HOSPITAL_COMMUNITY): Payer: Self-pay | Admitting: Emergency Medicine

## 2022-11-21 DIAGNOSIS — R072 Precordial pain: Secondary | ICD-10-CM | POA: Diagnosis present

## 2022-11-21 DIAGNOSIS — R0602 Shortness of breath: Secondary | ICD-10-CM | POA: Diagnosis not present

## 2022-11-21 DIAGNOSIS — R079 Chest pain, unspecified: Secondary | ICD-10-CM

## 2022-11-21 DIAGNOSIS — F419 Anxiety disorder, unspecified: Secondary | ICD-10-CM | POA: Diagnosis not present

## 2022-11-21 DIAGNOSIS — I10 Essential (primary) hypertension: Secondary | ICD-10-CM | POA: Diagnosis not present

## 2022-11-21 DIAGNOSIS — Z85841 Personal history of malignant neoplasm of brain: Secondary | ICD-10-CM | POA: Insufficient documentation

## 2022-11-21 LAB — BASIC METABOLIC PANEL
Anion gap: 13 (ref 5–15)
BUN: 10 mg/dL (ref 6–20)
CO2: 20 mmol/L — ABNORMAL LOW (ref 22–32)
Calcium: 9.2 mg/dL (ref 8.9–10.3)
Chloride: 104 mmol/L (ref 98–111)
Creatinine, Ser: 0.85 mg/dL (ref 0.44–1.00)
GFR, Estimated: >60 mL/min (ref >60)
Glucose, Bld: 103 mg/dL — ABNORMAL HIGH (ref 70–99)
Potassium: 3.4 mmol/L — ABNORMAL LOW (ref 3.5–5.1)
Sodium: 137 mmol/L (ref 135–145)

## 2022-11-21 LAB — CBC
HCT: 33.5 % — ABNORMAL LOW (ref 36.0–46.0)
Hemoglobin: 11.2 g/dL — ABNORMAL LOW (ref 12.0–15.0)
MCH: 29.2 pg (ref 26.0–34.0)
MCHC: 33.4 g/dL (ref 30.0–36.0)
MCV: 87.5 fL (ref 80.0–100.0)
Platelets: 256 10*3/uL (ref 150–400)
RBC: 3.83 MIL/uL — ABNORMAL LOW (ref 3.87–5.11)
RDW: 13.5 % (ref 11.5–15.5)
WBC: 8 10*3/uL (ref 4.0–10.5)
nRBC: 0 % (ref 0.0–0.2)

## 2022-11-21 LAB — TROPONIN I (HIGH SENSITIVITY): Troponin I (High Sensitivity): 4 ng/L (ref <18)

## 2022-11-21 LAB — HEPATIC FUNCTION PANEL
ALT: 10 U/L (ref 0–44)
AST: 13 U/L — ABNORMAL LOW (ref 15–41)
Albumin: 3.4 g/dL — ABNORMAL LOW (ref 3.5–5.0)
Alkaline Phosphatase: 69 U/L (ref 38–126)
Bilirubin, Direct: <0.1 mg/dL (ref 0.0–0.2)
Total Bilirubin: 0.2 mg/dL — ABNORMAL LOW (ref 0.3–1.2)
Total Protein: 6.5 g/dL (ref 6.5–8.1)

## 2022-11-21 LAB — LIPASE, BLOOD: Lipase: 23 U/L (ref 11–51)

## 2022-11-21 LAB — HCG, SERUM, QUALITATIVE: Preg, Serum: NEGATIVE

## 2022-11-21 MED ORDER — FENTANYL CITRATE PF 50 MCG/ML IJ SOSY
50.0000 ug | PREFILLED_SYRINGE | Freq: Once | INTRAMUSCULAR | Status: AC
  Administered 2022-11-21: 50 ug via INTRAVENOUS
  Filled 2022-11-21: qty 1

## 2022-11-21 MED ORDER — IOHEXOL 350 MG/ML SOLN
75.0000 mL | Freq: Once | INTRAVENOUS | Status: AC | PRN
  Administered 2022-11-21: 75 mL via INTRAVENOUS

## 2022-11-21 MED ORDER — OXYCODONE-ACETAMINOPHEN 5-325 MG PO TABS
1.0000 | ORAL_TABLET | Freq: Once | ORAL | Status: AC
  Administered 2022-11-21: 1 via ORAL
  Filled 2022-11-21: qty 1

## 2022-11-21 MED ORDER — ONDANSETRON HCL 4 MG/2ML IJ SOLN
4.0000 mg | Freq: Once | INTRAMUSCULAR | Status: AC
  Administered 2022-11-21: 4 mg via INTRAVENOUS
  Filled 2022-11-21: qty 2

## 2022-11-21 MED ORDER — DIPHENHYDRAMINE HCL 50 MG/ML IJ SOLN
25.0000 mg | Freq: Once | INTRAMUSCULAR | Status: AC
  Administered 2022-11-21: 25 mg via INTRAVENOUS
  Filled 2022-11-21: qty 1

## 2022-11-21 NOTE — Discharge Instructions (Signed)
Cardiac testing today was normal. CT did not show any findings of blood clot or other abnormalities. Please follow-up with your primary care doctor. Return here for new concerns.

## 2022-11-21 NOTE — ED Notes (Signed)
Pt's pain not improved and itching- EDP notified.

## 2022-11-21 NOTE — ED Triage Notes (Signed)
Pt presents via EMS for central CP and bilateral arm pain suddenly while at rest in bed. Feels "deep" and radiates into back and arms.   Endorses N, dizziness No prior episodes   EMS VS: EKG unremark, 149/98, 100%RA, 78bpm  H/o pituitary brain tumor, bilateral bloody breast discharge and pain (mastectomy scheduled in November to confirm suspected BCA)

## 2022-11-21 NOTE — ED Provider Notes (Signed)
[facility] AT [facility] Provider Note   CSN: [id number] Arrival date & time: 11/21/22  4098     History  Chief Complaint  Patient presents with   Chest Pain    Leah Shea is a 37 y.o. female.  The history is provided by the patient and medical records.  Chest Pain  37 y.o. F with history of G6PD deficiency, sleep apnea, PTSD, sickle cell trait, hypertension, alcohol abuse, presenting to the ED for chest pain.  States pain along central chest and midsternal region, radiates into the arms.  Also feels it into her back.  States it feels very "sore".  But does have worsening pain with breathing.  She does feel little short of breath.  No cough/fever.  She denies any prior cardiac history.  Does have history of brain tumor and recently diagnosed with breast cancer, scheduled for mastectomy 12/23/2022 with Dr. Carolynne Edouard.  Home Medications Prior to Admission medications   Medication Sig Start Date End Date Taking? Authorizing Provider  acetaminophen (TYLENOL CHILDRENS) 160 MG/5ML suspension Take 10 mLs (320 mg total) by mouth every 6 (six) hours as needed. 04/04/22   Rising, Lurena Joiner, PA-C  cyclobenzaprine (FLEXERIL) 10 MG tablet Take 1 tablet (10 mg total) by mouth 2 (two) times daily as needed. 04/04/22   Rising, Lurena Joiner, PA-C  docusate sodium (COLACE) 100 MG capsule Take 1 capsule (100 mg total) by mouth 2 (two) times daily as needed. 10/08/21   Calvert Cantor, CNM  escitalopram (LEXAPRO) 20 MG tablet TAKE ONE TABLET BY MOUTH ONCE A DAY FOR DEPRESSION AND ANXIETY 11/10/20   [provider]  lamoTRIgine (LAMICTAL) 200 MG tablet Take 200 mg by mouth 2 (two) times daily. 06/03/21   [provider]  levETIRAcetam (KEPPRA) 500 MG tablet Take 1,000 mg by mouth 2 (two) times daily. 06/03/21   [provider]  LORazepam (ATIVAN) 0.5 MG tablet Take 1 tablet (0.5 mg total) by mouth every 6 (six) hours as needed for anxiety or sleep.  10/02/20   Milas Hock, MD  NIFEdipine (ADALAT CC) 30 MG 24 hr tablet Take 30 mg by mouth daily.    [provider]  omeprazole (PRILOSEC) 20 MG capsule Take 1 capsule (20 mg total) by mouth daily. 05/05/21   Jenel Lucks, MD  ondansetron (ZOFRAN-ODT) 4 MG disintegrating tablet Take 1 tablet (4 mg total) by mouth every 4 (four) hours as needed for nausea or vomiting. 09/22/21   Rolm Bookbinder, CNM  oxyCODONE-acetaminophen (PERCOCET/ROXICET) 5-325 MG tablet Take 1 tablet by mouth every 6 (six) hours as needed for severe pain. Patient taking differently: Take 1 tablet by mouth every 6 (six) hours as needed for severe pain. 10-325 dose change per patient 07/06/22   Redwine, Madison A, PA-C  polyethylene glycol (MIRALAX) 17 g packet Take 17 g by mouth daily. 10/08/21   Calvert Cantor, CNM  promethazine (PHENERGAN) 25 MG tablet Take 1 tablet (25 mg total) by mouth every 8 (eight) hours as needed for nausea or vomiting. 09/22/21   Rolm Bookbinder, CNM  scopolamine (TRANSDERM-SCOP) 1 MG/3DAYS Place 1 patch (1.5 mg total) onto the skin every 3 (three) days. 08/30/21   Rolm Bookbinder, CNM  SUMAtriptan (IMITREX) 6 MG/0.5ML SOSY injection INJECT 6MG  (ONE SINGLE DOSE AUTOINJECTOR) SUBCUTANEOUSLY TWICE A DAY AS NEEDED FOR MIGRAINE 06/03/21   [provider]      Allergies    Mushroom extract complex, Other, Nsaids, Asa [aspirin], Prenatal vitamins,  Compazine [prochlorperazine], Reglan [metoclopramide], and Sulfa antibiotics    Review of Systems   Review of Systems  Cardiovascular:  Positive for chest pain.  All other systems reviewed and are negative.   Physical Exam Updated Vital Signs BP (!) 142/100 (BP Location: Right Arm)   Pulse 88   Temp 97.9 F (36.6 C) (Oral)   Resp 18   Wt 97 kg   SpO2 100%   BMI 35.04 kg/m   Physical Exam Vitals and nursing note reviewed.  Constitutional:      Appearance: She is well-developed.  HENT:     Head: Normocephalic and  atraumatic.  Eyes:     Conjunctiva/sclera: Conjunctivae normal.     Pupils: Pupils are equal, round, and reactive to light.  Cardiovascular:     Rate and Rhythm: Normal rate and regular rhythm.     Heart sounds: Normal heart sounds.  Pulmonary:     Effort: Pulmonary effort is normal.     Breath sounds: Normal breath sounds.  Abdominal:     General: Bowel sounds are normal.     Palpations: Abdomen is soft.  Musculoskeletal:        General: Normal range of motion.     Cervical back: Normal range of motion.  Skin:    General: Skin is warm and dry.  Neurological:     Mental Status: She is alert and oriented to person, place, and time.  Psychiatric:     Comments: Tearful, seems a bit anxious, hyperventilating     ED Results / Procedures / Treatments   Labs (all labs ordered are listed, but only abnormal results are displayed) Labs Reviewed  BASIC METABOLIC PANEL - Abnormal; Notable for the following components:      Result Value   Potassium 3.4 (*)    CO2 20 (*)    Glucose, Bld 103 (*)    All other components within normal limits  CBC - Abnormal; Notable for the following components:   RBC 3.83 (*)    Hemoglobin 11.2 (*)    HCT 33.5 (*)    All other components within normal limits  HEPATIC FUNCTION PANEL - Abnormal; Notable for the following components:   Albumin 3.4 (*)    AST 13 (*)    Total Bilirubin 0.2 (*)    All other components within normal limits  HCG, SERUM, QUALITATIVE  LIPASE, BLOOD  TROPONIN I (HIGH SENSITIVITY)    EKG EKG Interpretation Date/Time:  Monday November 21 2022 03:23:31 EDT Ventricular Rate:  79 PR Interval:  158 QRS Duration:  70 QT Interval:  342 QTC Calculation: 392 R Axis:   29  Text Interpretation: Normal sinus rhythm with sinus arrhythmia Low voltage QRS Septal infarct , age undetermined Abnormal ECG Confirmed by Tilden Fossa [phone]) on 11/21/2022 3:53:55 AM  Radiology CT Angio Chest PE W and/or Wo Contrast  Result Date:  11/21/2022 CLINICAL DATA:  37 year old female with recent diagnosis of breast cancer presenting with chest pain and shortness of breath. Bilateral arm pain. * Tracking Code: BO * EXAM: CT ANGIOGRAPHY CHEST WITH CONTRAST TECHNIQUE: Multidetector CT imaging of the chest was performed using the standard protocol during bolus administration of intravenous contrast. Multiplanar CT image reconstructions and MIPs were obtained to evaluate the vascular anatomy. RADIATION DOSE REDUCTION: This exam was performed according to the departmental dose-optimization program which includes automated exposure control, adjustment of the mA and/or kV according to patient size and/or use of iterative reconstruction technique. CONTRAST:  75mL OMNIPAQUE IOHEXOL 350  MG/ML SOLN COMPARISON:  No priors. FINDINGS: Cardiovascular: No filling defects are noted within the pulmonary arterial tree to suggest pulmonary embolism. Heart size is normal. There is no significant pericardial fluid, thickening or pericardial calcification. No atherosclerotic calcifications are noted in the thoracic aorta or the coronary arteries. Mediastinum/Nodes: No pathologically enlarged mediastinal or hilar lymph nodes. Please note that accurate exclusion of hilar adenopathy is limited on noncontrast CT scans. Esophagus is unremarkable in appearance. No axillary lymphadenopathy. Lungs/Pleura: No acute consolidative airspace disease. No pleural effusions. No suspicious appearing pulmonary nodules or masses are noted. Upper Abdomen: Unremarkable. Musculoskeletal: Small clip noted in the medial aspect of the upper right breast. There are no aggressive appearing lytic or blastic lesions noted in the visualized portions of the skeleton. Review of the MIP images confirms the above findings. IMPRESSION: 1. No acute findings are noted in the thorax to account for the patient's symptoms. Specifically, no evidence of pulmonary embolism. Electronically Signed   By: Trudie Reed M.D.   On: 11/21/2022 05:41   DG Chest 2 View  Result Date: 11/21/2022 CLINICAL DATA:  Chest pain EXAM: CHEST - 2 VIEW COMPARISON:  07/06/2022 FINDINGS: Artifact from EKG leads. Normal heart size and mediastinal contours. No acute infiltrate or edema. No effusion or pneumothorax. No acute osseous findings. IMPRESSION: Negative chest. Electronically Signed   By: Tiburcio Pea M.D.   On: 11/21/2022 05:25    Procedures Procedures    Medications Ordered in ED Medications  ondansetron (ZOFRAN) injection 4 mg (4 mg Intravenous Given 11/21/22 0349)  fentaNYL (SUBLIMAZE) injection 50 mcg (50 mcg Intravenous Given 11/21/22 0350)  diphenhydrAMINE (BENADRYL) injection 25 mg (25 mg Intravenous Given 11/21/22 0430)  iohexol (OMNIPAQUE) 350 MG/ML injection 75 mL (75 mLs Intravenous Contrast Given 11/21/22 0446)  oxyCODONE-acetaminophen (PERCOCET/ROXICET) 5-325 MG per tablet 1 tablet (1 tablet Oral Given 11/21/22 6644)    ED Course/ Medical Decision Making/ A&P                                 Medical Decision Making Amount and/or Complexity of Data Reviewed Labs: ordered. Radiology: ordered and independent interpretation performed. ECG/medicine tests: ordered and independent interpretation performed.  Risk Prescription drug management.   37 year old female presenting to the ED for chest pain.  Central in nature with some radiation into the back.  Recent diagnosis of breast cancer and scheduled for mastectomy in November.  She is afebrile and nontoxic.  She is tearful and seems a bit anxious on exam.  Her lung sounds are clear without any wheezes or rhonchi.  She is hemodynamically stable.  Given her symptoms send recent findings, concern for possible PE.  EKG is sinus rhythm.  Will check labs, chest x-ray, CTA of chest..  Labs as above--no leukocytosis or electrolyte derangement.  Troponin is negative.  CTA negative for any acute findings, specifically no PE.  Remains HD stable.  I have  very low suspicion for ACS.  Patient is already on chronic oxycodone, can continue this at home.  Follow-up closely with PCP.  Return here for new concerns.  Final Clinical Impression(s) / ED Diagnoses Final diagnoses:  Chest pain in adult    Rx / DC Orders ED Discharge Orders     None         Garlon Hatchet, PA-C 11/21/22 0347    Tilden Fossa, MD 11/22/22 [phone]

## 2022-12-14 ENCOUNTER — Encounter: Payer: Self-pay | Admitting: Physician Assistant

## 2022-12-14 ENCOUNTER — Ambulatory Visit (INDEPENDENT_AMBULATORY_CARE_PROVIDER_SITE_OTHER): Payer: Medicaid Other | Admitting: Physician Assistant

## 2022-12-14 VITALS — BP 134/86 | HR 71 | Ht 65.5 in | Wt 200.6 lb

## 2022-12-14 DIAGNOSIS — N6452 Nipple discharge: Secondary | ICD-10-CM

## 2022-12-14 DIAGNOSIS — G40219 Localization-related (focal) (partial) symptomatic epilepsy and epileptic syndromes with complex partial seizures, intractable, without status epilepticus: Secondary | ICD-10-CM

## 2022-12-14 NOTE — Progress Notes (Signed)
Patient ID: Leah Shea, female    DOB: 09/28/1985, 37 y.o.   MRN: [id number]  Chief Complaint  Patient presents with   Pre-op Exam      ICD-10-CM   1. Bloody discharge from left nipple  N64.52     2. Localization-related (focal) (partial) symptomatic epilepsy and epileptic syndromes with complex partial seizures, intractable, 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HCC)  G40.219        History of Present Illness: Leah Shea is a 37 y.o.  female  with a history of pituitary adenoma and bilateral nipple drainage.  She presents for preoperative evaluation for upcoming procedure, bilateral mastectomy with immediate breast reconstruction using tissue expanders and ADM, scheduled for 12/23/2022 with Dr.  Ladona Ridgel .  The patient has not had problems with anesthesia.  Previous surgeries without complication.  She confirms that she would like to move forward with a bilateral mastectomy and implant-based reconstruction.  She states that she has been prescribed a CPAP, but rarely uses it.  She does have mild asthma, but well-controlled.  She states that her focal seizures typically occur 2-3 times per month, but last seizure was over a month ago.  She denies any associated convulsions.  Patient states that she used to see Dr. Noel Gerold at the [facility], but has recently been referred to [facility] for ongoing neurology care.  She understands the need for surgical clearance and recommendations given lowered threshold for seizures perioperatively.  She sees Dr. Jenne Pane at [facility] for her chronic pain.  Will need to send a clearance to them for 1 week of additional pain control.  She states that she cannot take NSAIDs due to allergy.  She reports that her pituitary adenoma is nonsurgical at this time and they will proceed with medical management.  She reports that her husband will be able to assist with her postoperative recovery.  She denies any personal or family history of blood clots or clotting  disorder.  Denies any personal history of cancer, varicosities, or severe cardiac/pulmonary disease.  She does confirm family history of breast cancer as part of her motivation to move forward with surgery. Not sure who she sees for neurology and cannot locate in chart. Will have her message our practice with the name and phone/fax for her neurology team so we can send clearance.   Summary of Previous Visit: She was seen for consult by Dr. Ladona Ridgel on 11/09/2022.  At that time, she had seen Dr. Carolynne Edouard to discuss bilateral mastectomy.  She already had a pending appointment with neurosurgery regarding a pituitary adenoma.  Evidently she had family history of breast cancer and was interested in in implant based reconstruction.  She was informed that she would need neurologist clearance prior to surgery as well as any potential adjustments to her Keppra medication.  Job: Not currently working, no FMLA or STD required.  PMH Significant for: Pituitary adenoma, bilateral nipple drainage in context of elevated prolactin levels, previous breast reduction surgery, strong family history of breast cancer, OSA, asthma, seizure disorder, migraine disorder, G6PD deficiency, chronic pain followed by pain management, GERD.   Past Medical History: Allergies: Allergies  Allergen Reactions   Mushroom Extract Complex Anaphylaxis and Hives   Other Anaphylaxis and Hives    Mushrooms   Nsaids Hives, Swelling and Other (See Comments)    "Body burns" Tolerated celecoxib 09/2020    Asa [Aspirin]    Prenatal Vitamins    Compazine [Prochlorperazine] Anxiety   Reglan [Metoclopramide] Anxiety  Sulfa Antibiotics Other (See Comments)    Current Medications:  Current Outpatient Medications:    acetaminophen (TYLENOL CHILDRENS) 160 MG/5ML suspension, Take 10 mLs (320 mg total) by mouth every 6 (six) hours as needed., Disp: 148 mL, Rfl: 0   ALPRAZolam (XANAX) 0.5 MG tablet, Take 0.5 mg by mouth 3 (three) times daily as  needed., Disp: , Rfl:    cyclobenzaprine (FLEXERIL) 10 MG tablet, Take 1 tablet (10 mg total) by mouth 2 (two) times daily as needed., Disp: 20 tablet, Rfl: 0   docusate sodium (COLACE) 100 MG capsule, Take 1 capsule (100 mg total) by mouth 2 (two) times daily as needed., Disp: 30 capsule, Rfl: 2   escitalopram (LEXAPRO) 20 MG tablet, TAKE ONE TABLET BY MOUTH ONCE A DAY FOR DEPRESSION AND ANXIETY, Disp: , Rfl:    lamoTRIgine (LAMICTAL) 200 MG tablet, Take 200 mg by mouth 2 (two) times daily., Disp: , Rfl:    levETIRAcetam (KEPPRA) 500 MG tablet, Take 1,000 mg by mouth 2 (two) times daily., Disp: , Rfl:    LORazepam (ATIVAN) 0.5 MG tablet, Take 1 tablet (0.5 mg total) by mouth every 6 (six) hours as needed for anxiety or sleep., Disp: 30 tablet, Rfl: 0   NIFEdipine (ADALAT CC) 30 MG 24 hr tablet, Take 30 mg by mouth daily., Disp: , Rfl:    omeprazole (PRILOSEC) 20 MG capsule, Take 1 capsule (20 mg total) by mouth daily., Disp: 60 capsule, Rfl: 1   ondansetron (ZOFRAN-ODT) 4 MG disintegrating tablet, Take 1 tablet (4 mg total) by mouth every 4 (four) hours as needed for nausea or vomiting., Disp: 30 tablet, Rfl: 1   oxyCODONE-acetaminophen (PERCOCET/ROXICET) 5-325 MG tablet, Take 1 tablet by mouth every 6 (six) hours as needed for severe pain. (Patient taking differently: Take 1 tablet by mouth every 6 (six) hours as needed for severe pain (pain score 7-10). 10-325 dose change per patient), Disp: 6 tablet, Rfl: 0   polyethylene glycol (MIRALAX) 17 g packet, Take 17 g by mouth daily., Disp: 14 each, Rfl: 0   promethazine (PHENERGAN) 25 MG tablet, Take 1 tablet (25 mg total) by mouth every 8 (eight) hours as needed for nausea or vomiting., Disp: 30 tablet, Rfl: 2   scopolamine (TRANSDERM-SCOP) 1 MG/3DAYS, Place 1 patch (1.5 mg total) onto the skin every 3 (three) days., Disp: 10 patch, Rfl: 12   SUMAtriptan (IMITREX) 6 MG/0.5ML SOSY injection, INJECT 6MG  (ONE SINGLE DOSE AUTOINJECTOR) SUBCUTANEOUSLY TWICE  A DAY AS NEEDED FOR MIGRAINE, Disp: , Rfl:   Past Medical Problems: 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   [facility] problem list   IBS (irritable bowel syndrome)    Nausea and vomiting during pregnancy 07/25/2020   PTSD (post-traumatic stress disorder)    Shortness of breath    with exertion or panic attack   Sickle cell trait (HCC)    Sleep apnea    awaiting a sleep study to be done at [facility] ([facility])    Past Surgical History: Past Surgical History:  Procedure Laterality Date   ABDOMINAL HERNIA REPAIR  10/08/2020   BREAST SURGERY Bilateral  reductions   CESAREAN SECTION  2010, [id number]   x 3   CESAREAN SECTION N/A 09/28/2020   Procedure: CESAREAN SECTION;  Surgeon: Venora Maples, MD;  Location: [facility] LD ORS;  Service: Obstetrics;  Laterality: N/A;   EYE SURGERY     x 10 as a child   LAPAROTOMY N/A 10/08/2020   Procedure: EXPLORATORY LAPAROTOMY WITH CLOSURE ABDOMINAL WALL DEFECT POSSIBLE BOWEL RESECTION;  Surgeon: Harriette Bouillon, MD;  Location: [facility] OR;  Service: General;  Laterality: N/A;   PILONIDAL CYST EXCISION  2020   REDUCTION MAMMAPLASTY     TONSILLECTOMY N/A 10/07/2013   Procedure: TONSILLECTOMY;  Surgeon: Christia Reading, MD;  Location: [facility] OR;  Service: ENT;  Laterality: N/A;   TUMOR REMOVAL     tumor removed from back    Social History: Social History   Socioeconomic History   Marital status: Married    Spouse name: Not on file   Number of children: 5   Years of education: Not on file   Highest education level: Not on file  Occupational History   Not on file  Tobacco Use   Smoking status: Former    Current packs/day: 0.00    Average  packs/day: 0.3 packs/day for 1 year (0.3 ttl pk-yrs)    Types: Cigarettes    Start date: 07/02/2012    Quit date: 07/02/2013    Years since quitting: 9.4   Smokeless tobacco: Never  Vaping Use   Vaping status: Never Used  Substance and Sexual Activity   Alcohol use: Not Currently   Drug use: Not Currently    Types: Marijuana    Comment: last used January 3   Sexual activity: Not Currently    Partners: Male    Birth control/protection: None  Other Topics Concern   Not on file  Social History Narrative   Not on file   Social Determinants of Health   Financial Resource Strain: Not on file  Food Insecurity: No Food Insecurity (09/22/2020)   Hunger Vital Sign    Worried About Running Out of Food in the Last Year: Never true    Ran Out of Food in the Last Year: Never true  Transportation Needs: No Transportation Needs (09/22/2020)   PRAPARE - Administrator, Civil Service (Medical): No    Lack of Transportation (Non-Medical): No  Physical Activity: Not on file  Stress: Not on file  Social Connections: Not on file  Intimate Partner Violence: Not on file    Family History: Family History  Problem Relation Age of Onset   Breast cancer Mother    Clotting disorder Mother    Diabetes Mother    Heart disease Mother    Breast cancer Maternal Grandmother    Diabetes Maternal Grandmother    Prostate cancer Maternal Grandfather        metastatic   Pancreatic cancer Maternal Grandfather    Colon cancer Maternal Grandfather    Diabetes Maternal Grandfather    Breast cancer Maternal Aunt 36       metastatic   Cancer Maternal Aunt        either breast or ovarian    Cancer Maternal Uncle        unk type   Colon polyps Neg Hx    Stomach cancer Neg Hx    Esophageal cancer Neg Hx     Review of Systems: ROS Denies any recent difficulty breathing, fevers, or leg swelling.  She was seen in the ER recently, but  evidently workup was entirely benign and she was informed that it  was likely anxiety related.  Physical Exam: Vital Signs BP 134/86 (BP Location: Left Arm, Patient Position: Sitting, Cuff Size: Normal)   Pulse 71   Ht 5' 5.5" (1.664 m)   Wt 200 lb 9.6 oz (91 kg)   SpO2 99%   BMI 32.87 kg/m   Physical Exam Constitutional:      General: Not in acute distress.    Appearance: Normal appearance. Not ill-appearing.  HENT:     Head: Normocephalic and atraumatic.  Eyes:     Pupils: Pupils are equal, round. Cardiovascular:     Rate and Rhythm: Normal rate.    Pulses: Normal pulses.  Pulmonary:     Effort: No respiratory distress or increased work of breathing.  Speaks in full sentences. Abdominal:     General: Abdomen is flat. No distension.   Musculoskeletal: Normal range of motion. No lower extremity swelling or edema. No varicosities. Skin:    General: Skin is warm and dry.     Findings: No erythema or rash.  Neurological:     Mental Status: Alert and oriented to person, place, and time.  Psychiatric:        Mood and Affect: Mood normal.        Behavior: Behavior normal.    Assessment/Plan: The patient is scheduled for bilateral mastectomy with placement of tissue expander and ADM with Dr.  Ladona Ridgel .  Risks, benefits, and alternatives of procedure discussed, questions answered and consent obtained.    Smoking Status: Non-smoker. Last Mammogram: 09/2022; Results: BI-RADS Category 4: Suspicious.  Right breast inner upper quadrant mass, but subsequently biopsied and benign.  Caprini Score: 3; Risk Factors include: Age, BMI greater than 25, and length of planned surgery. Recommendation for mechanical prophylaxis. Encourage early ambulation.   Pictures obtained: 11/09/2022  Post-op Rx sent to pharmacy: Doxycyline.  Sulfa allergy, so no Bactrim.  She reports ample Zofran at home.  Will hold off on pain medication given need for chronic pain provider clearances.  Patient was provided with the General Surgical Risk consent document and Pain  Medication Agreement prior to their appointment.  They had adequate time to read through the risk consent documents and Pain Medication Agreement. We also discussed them in person together during this preop appointment. All of their questions were answered to their satisfaction.  Recommended calling if they have any further questions.  Risk consent form and Pain Medication Agreement to be scanned into patient's chart.  The risks that can be encountered with and after placement of a breast expander placement were discussed and include the following but not limited to these: bleeding, infection, delayed healing, anesthesia risks, skin sensation changes, injury to structures including nerves, blood vessels, and muscles which may be temporary or permanent, allergies to tape, suture materials and glues, blood products, topical preparations or injected agents, skin contour irregularities, skin discoloration and swelling, deep vein thrombosis, cardiac and pulmonary complications, pain, which may persist, fluid accumulation, wrinkling of the skin over the expander, changes in nipple or breast sensation, expander leakage or rupture, faulty position of the expander, persistent pain, formation of tight scar tissue around the expander (capsular contracture), possible need for revisional surgery or staged procedures.     Electronically signed by: Evelena Leyden, PA-C 12/14/2022 3:02 PM

## 2022-12-14 NOTE — Addendum Note (Signed)
Addended by: Weyman Croon on: 12/14/2022 07:31 PM   Modules accepted: Orders

## 2022-12-15 ENCOUNTER — Telehealth: Payer: Self-pay

## 2022-12-15 ENCOUNTER — Encounter (HOSPITAL_BASED_OUTPATIENT_CLINIC_OR_DEPARTMENT_OTHER): Payer: Self-pay | Admitting: General Surgery

## 2022-12-15 NOTE — Telephone Encounter (Signed)
Surgical clearance faxed to Dr. Jenne Pane with confirmed receipt. I will scan into patient's chart.

## 2022-12-15 NOTE — Progress Notes (Signed)
   12/15/22 1518  PAT Phone Screen  Recent  Lab Work, EKG, CXR? Yes  Where was this test performed? EKG [facility] 11/21/22   PT has hx of pituitary lesion and seizures. Reports last seizure was 3 weeks ago and she was feeling an aura of a seizure coming on today. Recent OV note w/ Neuro 11/17/22 reviewed with Dr Isaias Cowman. Pt's upcoming surgery will need to be done at the main OR. Pt is aware and expressed understanding. Spoke w/ Toniann Fail at [facility] and DM sent to [facility] at Dr Lubertha Basque office.

## 2022-12-22 ENCOUNTER — Encounter (HOSPITAL_COMMUNITY): Payer: Self-pay | Admitting: General Surgery

## 2022-12-22 NOTE — Anesthesia Preprocedure Evaluation (Addendum)
Anesthesia Evaluation  Patient identified by MRN, date of birth, ID band Patient awake    Reviewed: Allergy & Precautions, NPO status , Patient's Chart, lab work & pertinent test results  Airway Mallampati: III  TM Distance: >3 FB Neck ROM: Full    Dental  (+) Teeth Intact, Dental Advisory Given   Pulmonary asthma (well controlled) , sleep apnea (noncompliant w/ CPAP) , former smoker   Pulmonary exam normal breath sounds clear to auscultation       Cardiovascular hypertension (132/85 preop), Pt. on medications Normal cardiovascular exam Rhythm:Regular Rate:Normal  TTE 10/27/2012 (care everywhere): Interpretation Summary  A complete portable two-dimensional transthoracic echocardiogram was  performed (2D, M-mode, Doppler and color flow Doppler).  The left ventricle is normal in size, wall thickness and wall motion with  ejection fraction of 55-60%.  The left ventricular diastolic function is normal.  The left atrium is mildly dilated.  There is mild (1+) tricuspid regurgitation.  Right ventricular systolic pressure isnormal.  There is mild (1+) pulmonic valvular regurgitation.  There is no pericardial effusion.    Neuro/Psych  Headaches, Seizures - (last one 3 weeks ago, focal seizure- zoning out), Poorly Controlled,  PSYCHIATRIC DISORDERS Anxiety Depression    PTSDFollows with [facility]surgery [facility] for history of sellar lesion.  Per note 11/17/2022 by Dr. Cliffton Asters, it is not felt at this time that her symptoms of dizziness, blurry vision, seizures are caused by this lesion.  Recommended to proceed with observation and follow-up imaging in 1 year.  History of complex partial seizures per [facility] neurologist.  She is maintained on Lamictal and Keppra, but reports still having seizures (dizziness, dark spots in vision), last reportedly ~4 weeks ago.     GI/Hepatic Neg liver ROS,GERD  Medicated and Controlled,,  Endo/Other  diabetes  BMI  33  Renal/GU negative Renal ROS  negative genitourinary   Musculoskeletal negative musculoskeletal ROS (+)    Abdominal  (+) + obese  Peds  Hematology  (+) Blood dyscrasia, Sickle cell trait   Anesthesia Other Findings G6PD deficiency  Reproductive/Obstetrics negative OB ROS                             Anesthesia Physical Anesthesia Plan  ASA: 3  Anesthesia Plan: General and Regional   Post-op Pain Management: Tylenol PO (pre-op)*, Regional block*, Precedex and Ketamine IV*   Induction: Intravenous  PONV Risk Score and Plan: Ondansetron, Dexamethasone, Midazolam and Treatment may vary due to age or medical condition  Airway Management Planned: Oral ETT  Additional Equipment: None  Intra-op Plan:   Post-operative Plan: Extubation in OR  Informed Consent: I have reviewed the patients History and Physical, chart, labs and discussed the procedure including the risks, benefits and alternatives for the proposed anesthesia with the patient or authorized representative who has indicated his/her understanding and acceptance.     Dental advisory given  Plan Discussed with: CRNA  Anesthesia Plan Comments: (   )        Anesthesia Quick Evaluation

## 2022-12-22 NOTE — Progress Notes (Signed)
Anesthesia Chart Review: Same-day workup  37 year old female with pertinent history including asthma, migraines, complex partial seizures, G6PD deficiency, pituitary tumor, HTN, sickle cell trait, PTSD, OSA intolerant to CPAP.  History of prolactinoma diagnosed~ 2017 for which patient was unable to tolerate cabergoline or bromocriptine. Persistent prolactin elevation with bothersome galactorrhea and notable breast tenderness with new development of unilateral bloody discharge in setting of strong family history of breast cancer. Referred to plastic surgery for reconstruction and genetics by surgical team with plan for bilateral mastectomy and reconstruction.   Follows with neurosurgery at [facility] for history of sellar lesion.  Per note 11/17/2022 by Dr. Cliffton Asters, it is not felt at this time that her symptoms of dizziness, blurry vision, seizures are caused by this lesion.  Recommended to proceed with observation and follow-up imaging in 1 year.  History of complex partial seizures per [facility] neurologist.  She is maintained on Lamictal and Keppra, but reports still having seizures (dizziness, dark spots in vision), last reportedly ~4 weeks ago.   She will need day of surgery labs and evaluation.  EKG 11/21/2022: Normal sinus rhythm with sinus arrhythmia.  Rate 79. Low voltage QRS. Septal infarct , age undetermined  Brain MRI with/without contrast (10/08/2022) Per report deviation of infundibulum to the left seen on early dynamic phase. 4-5 mm hypoenhancing focus in the right aspect of the sella where her bone tenuously enhancing pituitary parenchyma in the midline and on the left. Cavernous sinus is reported as normal. 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 Pituitary infundibulum mildly deviated to the left but no other associated regional mass effect or induration.   TTE 10/27/2012 (care everywhere): Interpretation Summary  A complete portable  two-dimensional transthoracic echocardiogram was  performed (2D, M-mode, Doppler and color flow Doppler).  The left ventricle is normal in size, wall thickness and wall motion with  ejection fraction of 55-60%.  The left ventricular diastolic function is normal.  The left atrium is mildly dilated.  There is mild (1+) tricuspid regurgitation.  Right ventricular systolic pressure is normal.  There is mild (1+) pulmonic valvular regurgitation.  There is no pericardial effusion.     Zannie Cove [facility] Short Stay [facility]/Anesthesiology Phone [phone] 12/22/2022 10:38 AM

## 2022-12-22 NOTE — Progress Notes (Signed)
SDW call  Patient was given pre-op instructions over the phone. Patient verbalized understanding of instructions provided.     [facility] - [facility], Kathryne Sharper Neurologist: [facility], IT trainer Cardiologist -  Pulmonary:    PPM/ICD - denies Device Orders - na Rep Notified - na   Chest x-ray - 11/21/2022 EKG -  11/21/2022 Stress Test - ECHO -  Cardiac Cath -   Sleep Study/sleep apnea/CPAP: Diagnosed with sleep apnea, cannot tolerate her CPAP  Epic history of Type II diabetic.  States she does not have diabetes. Takes no medications and does not check her sugars.   Blood Thinner Instructions: denies Aspirin Instructions:denies   ERAS Protcol - Yes, clear fluids until 0645   COVID TEST- na    Anesthesia review: Yes.  HTN, DM, OSA, seizures, pituitary tumor, sickle cell trait   Patient denies shortness of breath, fever, cough and chest pain over the phone call  Your procedure is scheduled on Friday December 23, 2022  Report to [facility] Main Entrance "A" at 0715 A.M., then check in with the Admitting office.  Call this number if you have problems the morning of surgery:  [phone]   If you have any questions prior to your surgery date call [phone]: Open Monday-Friday 8am-4pm If you experience any cold or flu symptoms such as cough, fever, chills, shortness of breath, etc. between now and your scheduled surgery, please notify us at the above number    Remember:  Do not eat after midnight the night before your surgery  You may drink clear liquids until 0645  the morning of your surgery.   Clear liquids allowed are: Water, Non-Citrus Juices (without pulp), Carbonated Beverages, Clear Tea, Black Coffee ONLY (NO MILK, CREAM OR POWDERED CREAMER of any kind), and Gatorade   Take these medicines the morning of surgery with A SIP OF WATER:  Tylenol, xanax, lexapro, lamictal, prilosec, zofran, percocet  As needed: Albuterol, imitrex, ventolin  As of today, STOP taking any Aspirin  (unless otherwise instructed by your surgeon) Aleve, Naproxen, Ibuprofen, Motrin, Advil, Goody's, BC's, all herbal medications, fish oil, and all vitamins.

## 2022-12-23 ENCOUNTER — Other Ambulatory Visit: Payer: Self-pay

## 2022-12-23 ENCOUNTER — Encounter (HOSPITAL_COMMUNITY)
Admission: AD | Disposition: A | Discharge status: Home / Self Care | Payer: Self-pay | Source: Home / Self Care | Attending: Plastic Surgery

## 2022-12-23 ENCOUNTER — Ambulatory Visit (HOSPITAL_BASED_OUTPATIENT_CLINIC_OR_DEPARTMENT_OTHER): Payer: No Typology Code available for payment source | Admitting: Anesthesiology

## 2022-12-23 ENCOUNTER — Inpatient Hospital Stay (HOSPITAL_COMMUNITY)
Admission: AD | Admit: 2022-12-23 | Discharge: 2023-01-01 | DRG: 576 | Disposition: A | Discharge status: Home / Self Care | Payer: No Typology Code available for payment source | Attending: General Surgery | Admitting: General Surgery

## 2022-12-23 ENCOUNTER — Ambulatory Visit (HOSPITAL_COMMUNITY): Payer: No Typology Code available for payment source | Admitting: Anesthesiology

## 2022-12-23 ENCOUNTER — Encounter (HOSPITAL_COMMUNITY): Payer: Self-pay | Admitting: General Surgery

## 2022-12-23 DIAGNOSIS — Z8249 Family history of ischemic heart disease and other diseases of the circulatory system: Secondary | ICD-10-CM

## 2022-12-23 DIAGNOSIS — Z9889 Other specified postprocedural states: Secondary | ICD-10-CM

## 2022-12-23 DIAGNOSIS — I10 Essential (primary) hypertension: Secondary | ICD-10-CM | POA: Diagnosis present

## 2022-12-23 DIAGNOSIS — Z421 Encounter for breast reconstruction following mastectomy: Secondary | ICD-10-CM

## 2022-12-23 DIAGNOSIS — Y92230 Patient room in hospital as the place of occurrence of the external cause: Secondary | ICD-10-CM | POA: Diagnosis not present

## 2022-12-23 DIAGNOSIS — N6452 Nipple discharge: Secondary | ICD-10-CM | POA: Diagnosis not present

## 2022-12-23 DIAGNOSIS — Z87891 Personal history of nicotine dependence: Secondary | ICD-10-CM

## 2022-12-23 DIAGNOSIS — F419 Anxiety disorder, unspecified: Secondary | ICD-10-CM | POA: Diagnosis present

## 2022-12-23 DIAGNOSIS — Z791 Long term (current) use of non-steroidal anti-inflammatories (NSAID): Secondary | ICD-10-CM

## 2022-12-23 DIAGNOSIS — F4312 Post-traumatic stress disorder, chronic: Secondary | ICD-10-CM | POA: Diagnosis present

## 2022-12-23 DIAGNOSIS — W010XXA Fall on same level from slipping, tripping and stumbling without subsequent striking against object, initial encounter: Secondary | ICD-10-CM | POA: Diagnosis not present

## 2022-12-23 DIAGNOSIS — Y836 Removal of other organ (partial) (total) as the cause of abnormal reaction of the patient, or of later complication, without mention of misadventure at the time of the procedure: Secondary | ICD-10-CM | POA: Diagnosis not present

## 2022-12-23 DIAGNOSIS — F32A Depression, unspecified: Secondary | ICD-10-CM | POA: Diagnosis present

## 2022-12-23 DIAGNOSIS — G4733 Obstructive sleep apnea (adult) (pediatric): Secondary | ICD-10-CM | POA: Diagnosis present

## 2022-12-23 DIAGNOSIS — G43909 Migraine, unspecified, not intractable, without status migrainosus: Secondary | ICD-10-CM | POA: Diagnosis present

## 2022-12-23 DIAGNOSIS — Z634 Disappearance and death of family member: Secondary | ICD-10-CM

## 2022-12-23 DIAGNOSIS — Z1589 Genetic susceptibility to other disease: Secondary | ICD-10-CM

## 2022-12-23 DIAGNOSIS — D573 Sickle-cell trait: Secondary | ICD-10-CM | POA: Diagnosis present

## 2022-12-23 DIAGNOSIS — Z79899 Other long term (current) drug therapy: Secondary | ICD-10-CM

## 2022-12-23 DIAGNOSIS — G40219 Localization-related (focal) (partial) symptomatic epilepsy and epileptic syndromes with complex partial seizures, intractable, without status epilepticus: Secondary | ICD-10-CM | POA: Diagnosis present

## 2022-12-23 DIAGNOSIS — Z803 Family history of malignant neoplasm of breast: Secondary | ICD-10-CM

## 2022-12-23 DIAGNOSIS — T85848A Pain due to other internal prosthetic devices, implants and grafts, initial encounter: Secondary | ICD-10-CM | POA: Diagnosis not present

## 2022-12-23 DIAGNOSIS — N644 Mastodynia: Secondary | ICD-10-CM

## 2022-12-23 DIAGNOSIS — Z888 Allergy status to other drugs, medicaments and biological substances status: Secondary | ICD-10-CM

## 2022-12-23 DIAGNOSIS — J45909 Unspecified asthma, uncomplicated: Secondary | ICD-10-CM | POA: Diagnosis present

## 2022-12-23 DIAGNOSIS — Z7984 Long term (current) use of oral hypoglycemic drugs: Secondary | ICD-10-CM

## 2022-12-23 DIAGNOSIS — G40319 Generalized idiopathic epilepsy and epileptic syndromes, intractable, without status epilepticus: Secondary | ICD-10-CM | POA: Diagnosis present

## 2022-12-23 HISTORY — DX: Neoplasm of unspecified behavior of endocrine glands and other parts of nervous system: D49.7

## 2022-12-23 HISTORY — PX: BREAST RECONSTRUCTION WITH PLACEMENT OF TISSUE EXPANDER AND ALLODERM: SHX6805

## 2022-12-23 HISTORY — PX: SIMPLE MASTECTOMY WITH AXILLARY SENTINEL NODE BIOPSY: SHX6098

## 2022-12-23 LAB — CBC
HCT: 33.5 % — ABNORMAL LOW (ref 36.0–46.0)
Hemoglobin: 11 g/dL — ABNORMAL LOW (ref 12.0–15.0)
MCH: 28.7 pg (ref 26.0–34.0)
MCHC: 32.8 g/dL (ref 30.0–36.0)
MCV: 87.5 fL (ref 80.0–100.0)
Platelets: 234 10*3/uL (ref 150–400)
RBC: 3.83 MIL/uL — ABNORMAL LOW (ref 3.87–5.11)
RDW: 13.2 % (ref 11.5–15.5)
WBC: 8 10*3/uL (ref 4.0–10.5)
nRBC: 0 % (ref 0.0–0.2)

## 2022-12-23 LAB — BASIC METABOLIC PANEL
Anion gap: 12 (ref 5–15)
BUN: 13 mg/dL (ref 6–20)
CO2: 19 mmol/L — ABNORMAL LOW (ref 22–32)
Calcium: 9 mg/dL (ref 8.9–10.3)
Chloride: 108 mmol/L (ref 98–111)
Creatinine, Ser: 0.95 mg/dL (ref 0.44–1.00)
GFR, Estimated: >60 mL/min (ref >60)
Glucose, Bld: 109 mg/dL — ABNORMAL HIGH (ref 70–99)
Potassium: 3.3 mmol/L — ABNORMAL LOW (ref 3.5–5.1)
Sodium: 139 mmol/L (ref 135–145)

## 2022-12-23 LAB — POCT PREGNANCY, URINE: Preg Test, Ur: NEGATIVE

## 2022-12-23 SURGERY — SIMPLE MASTECTOMY
Anesthesia: Regional | Laterality: Bilateral

## 2022-12-23 MED ORDER — CHLORHEXIDINE GLUCONATE CLOTH 2 % EX PADS: 6.0000 | MEDICATED_PAD | Freq: Once | CUTANEOUS | Status: DC

## 2022-12-23 MED ORDER — POLYETHYLENE GLYCOL 3350 17 G PO PACK
17.0000 g | PACK | Freq: Every day | ORAL | Status: DC | PRN
  Administered 2022-12-24: 17 g via ORAL
  Filled 2022-12-23: qty 1

## 2022-12-23 MED ORDER — ACETAMINOPHEN 500 MG PO TABS
1000.0000 mg | ORAL_TABLET | Freq: Once | ORAL | Status: AC
  Filled 2022-12-23: qty 2

## 2022-12-23 MED ORDER — ONDANSETRON HCL 4 MG/2ML IJ SOLN
INTRAMUSCULAR | Status: AC
  Filled 2022-12-23: qty 2

## 2022-12-23 MED ORDER — DEXMEDETOMIDINE HCL IN NACL 80 MCG/20ML IV SOLN
INTRAVENOUS | Status: DC | PRN
  Administered 2022-12-23: 12 ug via INTRAVENOUS
  Administered 2022-12-23: 8 ug via INTRAVENOUS

## 2022-12-23 MED ORDER — LAMOTRIGINE 100 MG PO TABS
200.0000 mg | ORAL_TABLET | Freq: Two times a day (BID) | ORAL | Status: DC
  Administered 2022-12-23 – 2023-01-01 (×16): 200 mg via ORAL
  Filled 2022-12-23: qty 2
  Filled 2022-12-23: qty 8
  Filled 2022-12-23: qty 2
  Filled 2022-12-23: qty 8
  Filled 2022-12-23 (×4): qty 2
  Filled 2022-12-23: qty 8
  Filled 2022-12-23: qty 2
  Filled 2022-12-23 (×2): qty 8
  Filled 2022-12-23 (×2): qty 2
  Filled 2022-12-23: qty 8
  Filled 2022-12-23 (×3): qty 2

## 2022-12-23 MED ORDER — FENTANYL CITRATE (PF) 100 MCG/2ML IJ SOLN: 100.0000 ug | Freq: Once | INTRAMUSCULAR | Status: AC

## 2022-12-23 MED ORDER — LIDOCAINE 2% (20 MG/ML) 5 ML SYRINGE
INTRAMUSCULAR | Status: DC | PRN
  Administered 2022-12-23: 60 mg via INTRAVENOUS

## 2022-12-23 MED ORDER — ONDANSETRON 4 MG PO TBDP: 4.0000 mg | ORAL_TABLET | Freq: Four times a day (QID) | ORAL | Status: DC | PRN

## 2022-12-23 MED ORDER — SODIUM CHLORIDE 0.9 % IV SOLN
Freq: Once | INTRAVENOUS | Status: AC
  Administered 2022-12-23: 500 mL
  Filled 2022-12-23: qty 10
  Filled 2022-12-23: qty 1000

## 2022-12-23 MED ORDER — AMOXICILLIN-POT CLAVULANATE 875-125 MG PO TABS
1.0000 | ORAL_TABLET | Freq: Two times a day (BID) | ORAL | Status: DC
Start: 2022-12-24 — End: 2022-12-25
  Administered 2022-12-24 (×2): 1 via ORAL
  Filled 2022-12-23 (×2): qty 1

## 2022-12-23 MED ORDER — GABAPENTIN 300 MG PO CAPS
300.0000 mg | ORAL_CAPSULE | ORAL | Status: AC
  Administered 2022-12-23: 300 mg via ORAL
  Filled 2022-12-23: qty 1

## 2022-12-23 MED ORDER — DIAZEPAM 2 MG PO TABS
2.0000 mg | ORAL_TABLET | Freq: Two times a day (BID) | ORAL | Status: DC | PRN
  Administered 2022-12-23: 2 mg via ORAL
  Filled 2022-12-23: qty 1

## 2022-12-23 MED ORDER — FENTANYL CITRATE (PF) 100 MCG/2ML IJ SOLN
INTRAMUSCULAR | Status: DC | PRN
  Administered 2022-12-23: 100 ug via INTRAVENOUS
  Administered 2022-12-23: 50 ug via INTRAVENOUS
  Administered 2022-12-23: 100 ug via INTRAVENOUS

## 2022-12-23 MED ORDER — OXYCODONE HCL 5 MG PO TABS: 5.0000 mg | ORAL_TABLET | Freq: Four times a day (QID) | ORAL | Status: DC | PRN

## 2022-12-23 MED ORDER — ROCURONIUM BROMIDE 10 MG/ML (PF) SYRINGE
PREFILLED_SYRINGE | INTRAVENOUS | Status: AC
  Filled 2022-12-23: qty 10

## 2022-12-23 MED ORDER — PANTOPRAZOLE SODIUM 40 MG PO TBEC
40.0000 mg | DELAYED_RELEASE_TABLET | Freq: Every day | ORAL | Status: DC
  Administered 2022-12-23 – 2023-01-01 (×10): 40 mg via ORAL
  Filled 2022-12-23 (×10): qty 1

## 2022-12-23 MED ORDER — MIDAZOLAM HCL 2 MG/2ML IJ SOLN
2.0000 mg | Freq: Once | INTRAMUSCULAR | Status: AC
  Administered 2022-12-23: 2 mg via INTRAVENOUS

## 2022-12-23 MED ORDER — OXYCODONE HCL 5 MG PO TABS: 5.0000 mg | ORAL_TABLET | Freq: Four times a day (QID) | ORAL | Status: DC

## 2022-12-23 MED ORDER — AMISULPRIDE (ANTIEMETIC) 5 MG/2ML IV SOLN: 10.0000 mg | Freq: Once | INTRAVENOUS | Status: DC | PRN

## 2022-12-23 MED ORDER — OXYCODONE HCL 5 MG/5ML PO SOLN: 5.0000 mg | Freq: Once | ORAL | Status: DC | PRN

## 2022-12-23 MED ORDER — MIDAZOLAM HCL 5 MG/5ML IJ SOLN
INTRAMUSCULAR | Status: DC | PRN
  Administered 2022-12-23: 2 mg via INTRAVENOUS

## 2022-12-23 MED ORDER — ACETAMINOPHEN 650 MG RE SUPP: 650.0000 mg | Freq: Four times a day (QID) | RECTAL | Status: DC | PRN

## 2022-12-23 MED ORDER — ACETAMINOPHEN 500 MG PO TABS
1000.0000 mg | ORAL_TABLET | ORAL | Status: AC
  Administered 2022-12-23: 1000 mg via ORAL

## 2022-12-23 MED ORDER — ROCURONIUM BROMIDE 100 MG/10ML IV SOLN
INTRAVENOUS | Status: DC | PRN
  Administered 2022-12-23: 20 mg via INTRAVENOUS
  Administered 2022-12-23: 80 mg via INTRAVENOUS
  Administered 2022-12-23: 20 mg via INTRAVENOUS

## 2022-12-23 MED ORDER — ALBUTEROL SULFATE (2.5 MG/3ML) 0.083% IN NEBU: 2.5000 mg | INHALATION_SOLUTION | Freq: Four times a day (QID) | RESPIRATORY_TRACT | Status: DC | PRN

## 2022-12-23 MED ORDER — MIDAZOLAM HCL 2 MG/2ML IJ SOLN
INTRAMUSCULAR | Status: AC
  Filled 2022-12-23: qty 2

## 2022-12-23 MED ORDER — VITAMIN D (ERGOCALCIFEROL) 1.25 MG (50000 UNIT) PO CAPS
50000.0000 [IU] | ORAL_CAPSULE | ORAL | Status: DC
  Administered 2023-01-01: 50000 [IU] via ORAL
  Filled 2022-12-23 (×2): qty 1

## 2022-12-23 MED ORDER — MIDAZOLAM HCL 2 MG/2ML IJ SOLN
INTRAMUSCULAR | Status: AC
  Administered 2022-12-23: 2 mg via INTRAVENOUS
  Filled 2022-12-23: qty 2

## 2022-12-23 MED ORDER — ENOXAPARIN SODIUM 30 MG/0.3ML IJ SOSY
30.0000 mg | PREFILLED_SYRINGE | INTRAMUSCULAR | Status: DC
  Administered 2022-12-24 – 2022-12-27 (×3): 30 mg via SUBCUTANEOUS
  Filled 2022-12-23 (×3): qty 0.3

## 2022-12-23 MED ORDER — ONDANSETRON HCL 4 MG/2ML IJ SOLN
4.0000 mg | Freq: Four times a day (QID) | INTRAMUSCULAR | Status: DC | PRN
  Administered 2022-12-24 – 2022-12-25 (×4): 4 mg via INTRAVENOUS
  Filled 2022-12-23 (×4): qty 2

## 2022-12-23 MED ORDER — ONDANSETRON HCL 4 MG/2ML IJ SOLN: 4.0000 mg | Freq: Once | INTRAMUSCULAR | Status: DC | PRN

## 2022-12-23 MED ORDER — METHOCARBAMOL 500 MG PO TABS
500.0000 mg | ORAL_TABLET | Freq: Four times a day (QID) | ORAL | Status: DC | PRN
  Administered 2022-12-23: 500 mg via ORAL
  Filled 2022-12-23: qty 1

## 2022-12-23 MED ORDER — CEFAZOLIN SODIUM-DEXTROSE 1-4 GM/50ML-% IV SOLN
1.0000 g | Freq: Once | INTRAVENOUS | Status: DC
Start: 2022-12-23 — End: 2022-12-25

## 2022-12-23 MED ORDER — ORAL CARE MOUTH RINSE: 15.0000 mL | Freq: Once | OROMUCOSAL | Status: AC

## 2022-12-23 MED ORDER — SUMATRIPTAN SUCCINATE 6 MG/0.5ML ~~LOC~~ SOSY
6.0000 mg | PREFILLED_SYRINGE | SUBCUTANEOUS | Status: DC | PRN
  Administered 2022-12-27 – 2022-12-28 (×2): 6 mg via SUBCUTANEOUS
  Filled 2022-12-23 (×3): qty 0.5

## 2022-12-23 MED ORDER — FENTANYL CITRATE (PF) 250 MCG/5ML IJ SOLN
INTRAMUSCULAR | Status: AC
  Filled 2022-12-23: qty 5

## 2022-12-23 MED ORDER — MORPHINE SULFATE (PF) 2 MG/ML IV SOLN: 1.0000 mg | INTRAVENOUS | Status: DC | PRN

## 2022-12-23 MED ORDER — DEXAMETHASONE SODIUM PHOSPHATE 4 MG/ML IJ SOLN
INTRAMUSCULAR | Status: DC | PRN
  Administered 2022-12-23: 10 mg via INTRAVENOUS

## 2022-12-23 MED ORDER — LIDOCAINE 2% (20 MG/ML) 5 ML SYRINGE
INTRAMUSCULAR | Status: AC
  Filled 2022-12-23: qty 5

## 2022-12-23 MED ORDER — ACETAMINOPHEN 325 MG PO TABS
650.0000 mg | ORAL_TABLET | Freq: Four times a day (QID) | ORAL | Status: DC | PRN
  Administered 2022-12-23 – 2022-12-25 (×4): 650 mg via ORAL
  Filled 2022-12-23 (×4): qty 2

## 2022-12-23 MED ORDER — OXYCODONE HCL 5 MG PO TABS: 5.0000 mg | ORAL_TABLET | Freq: Once | ORAL | Status: DC | PRN

## 2022-12-23 MED ORDER — KETAMINE HCL 50 MG/5ML IJ SOSY
PREFILLED_SYRINGE | INTRAMUSCULAR | Status: AC
  Filled 2022-12-23: qty 5

## 2022-12-23 MED ORDER — 0.9 % SODIUM CHLORIDE (POUR BTL) OPTIME
TOPICAL | Status: DC | PRN
  Administered 2022-12-23: 3000 mL

## 2022-12-23 MED ORDER — ALPRAZOLAM 0.5 MG PO TABS
1.0000 mg | ORAL_TABLET | Freq: Two times a day (BID) | ORAL | Status: DC | PRN
  Administered 2022-12-23 – 2022-12-28 (×9): 1 mg via ORAL
  Filled 2022-12-23 (×9): qty 2

## 2022-12-23 MED ORDER — ESCITALOPRAM OXALATE 10 MG PO TABS
5.0000 mg | ORAL_TABLET | Freq: Every day | ORAL | Status: DC
  Administered 2022-12-23 – 2022-12-30 (×7): 5 mg via ORAL
  Filled 2022-12-23 (×7): qty 1

## 2022-12-23 MED ORDER — SUGAMMADEX SODIUM 200 MG/2ML IV SOLN
INTRAVENOUS | Status: DC | PRN
  Administered 2022-12-23: 200 mg via INTRAVENOUS

## 2022-12-23 MED ORDER — KETAMINE HCL 10 MG/ML IJ SOLN
INTRAMUSCULAR | Status: DC | PRN
  Administered 2022-12-23: 50 mg via INTRAVENOUS

## 2022-12-23 MED ORDER — BUPIVACAINE LIPOSOME 1.3 % IJ SUSP
INTRAMUSCULAR | Status: DC | PRN
Start: 2022-12-23 — End: 2022-12-23
  Administered 2022-12-23: 10 mL via PERINEURAL

## 2022-12-23 MED ORDER — ONDANSETRON HCL 4 MG/2ML IJ SOLN
INTRAMUSCULAR | Status: DC | PRN
  Administered 2022-12-23: 4 mg via INTRAVENOUS

## 2022-12-23 MED ORDER — OXYCODONE HCL 5 MG PO TABS
5.0000 mg | ORAL_TABLET | ORAL | Status: DC | PRN
Start: 2022-12-23 — End: 2022-12-24
  Administered 2022-12-23 – 2022-12-24 (×3): 10 mg via ORAL
  Filled 2022-12-23 (×3): qty 2

## 2022-12-23 MED ORDER — POVIDONE-IODINE 10 % EX SOLN
CUTANEOUS | Status: DC | PRN
  Administered 2022-12-23: 1 via TOPICAL

## 2022-12-23 MED ORDER — ROPIVACAINE HCL 5 MG/ML IJ SOLN
INTRAMUSCULAR | Status: DC | PRN
  Administered 2022-12-23: 30 mL via PERINEURAL

## 2022-12-23 MED ORDER — HYDROMORPHONE HCL 1 MG/ML IJ SOLN
INTRAMUSCULAR | Status: DC | PRN
  Administered 2022-12-23 (×2): .5 mg via INTRAVENOUS

## 2022-12-23 MED ORDER — HYDROMORPHONE HCL 1 MG/ML IJ SOLN
1.0000 mg | INTRAMUSCULAR | Status: DC | PRN
  Administered 2022-12-23 – 2022-12-24 (×5): 1 mg via INTRAVENOUS
  Filled 2022-12-23 (×5): qty 1

## 2022-12-23 MED ORDER — PHENOL 1.4 % MT LIQD
1.0000 | OROMUCOSAL | Status: DC | PRN
  Filled 2022-12-23: qty 177

## 2022-12-23 MED ORDER — LEVETIRACETAM 500 MG PO TABS
1000.0000 mg | ORAL_TABLET | Freq: Two times a day (BID) | ORAL | Status: DC
  Administered 2022-12-23 – 2023-01-01 (×17): 1000 mg via ORAL
  Filled 2022-12-23 (×17): qty 2

## 2022-12-23 MED ORDER — HYDROMORPHONE HCL 1 MG/ML IJ SOLN
INTRAMUSCULAR | Status: AC
  Filled 2022-12-23: qty 0.5

## 2022-12-23 MED ORDER — HYDROMORPHONE HCL 1 MG/ML IJ SOLN
INTRAMUSCULAR | Status: AC
  Filled 2022-12-23: qty 1

## 2022-12-23 MED ORDER — MIDAZOLAM HCL 2 MG/2ML IJ SOLN: 2.0000 mg | Freq: Once | INTRAMUSCULAR | Status: AC

## 2022-12-23 MED ORDER — HYDROMORPHONE HCL 1 MG/ML IJ SOLN
0.2500 mg | INTRAMUSCULAR | Status: DC | PRN
  Administered 2022-12-23 (×4): 0.5 mg via INTRAVENOUS

## 2022-12-23 MED ORDER — PROPOFOL 10 MG/ML IV BOLUS
INTRAVENOUS | Status: DC | PRN
  Administered 2022-12-23: 200 mg via INTRAVENOUS

## 2022-12-23 MED ORDER — OXYCODONE-ACETAMINOPHEN 5-325 MG PO TABS: 1.0000 | ORAL_TABLET | Freq: Four times a day (QID) | ORAL | Status: DC

## 2022-12-23 MED ORDER — MORPHINE SULFATE (PF) 2 MG/ML IV SOLN
1.0000 mg | INTRAVENOUS | Status: DC | PRN
Start: 2022-12-23 — End: 2022-12-23
  Administered 2022-12-23 (×3): 4 mg via INTRAVENOUS
  Filled 2022-12-23 (×3): qty 2

## 2022-12-23 MED ORDER — SCOPOLAMINE 1 MG/3DAYS TD PT72
1.0000 | MEDICATED_PATCH | TRANSDERMAL | Status: DC | PRN
  Administered 2022-12-24 – 2022-12-27 (×2): 1.5 mg via TRANSDERMAL
  Filled 2022-12-23 (×4): qty 1

## 2022-12-23 MED ORDER — DEXAMETHASONE SODIUM PHOSPHATE 10 MG/ML IJ SOLN
INTRAMUSCULAR | Status: AC
  Filled 2022-12-23: qty 1

## 2022-12-23 MED ORDER — CEFAZOLIN SODIUM-DEXTROSE 2-4 GM/100ML-% IV SOLN
2.0000 g | INTRAVENOUS | Status: AC
  Administered 2022-12-23: 2 g via INTRAVENOUS
  Filled 2022-12-23: qty 100

## 2022-12-23 MED ORDER — FENTANYL CITRATE (PF) 100 MCG/2ML IJ SOLN
INTRAMUSCULAR | Status: AC
  Administered 2022-12-23: 100 ug via INTRAVENOUS
  Filled 2022-12-23: qty 2

## 2022-12-23 MED ORDER — DIAZEPAM 2 MG PO TABS
5.0000 mg | ORAL_TABLET | Freq: Two times a day (BID) | ORAL | Status: DC | PRN
  Administered 2022-12-24: 5 mg via ORAL
  Filled 2022-12-23 (×2): qty 3

## 2022-12-23 MED ORDER — LACTATED RINGERS IV SOLN: INTRAVENOUS | Status: DC

## 2022-12-23 MED ORDER — OXYCODONE-ACETAMINOPHEN 10-325 MG PO TABS: 1.0000 | ORAL_TABLET | Freq: Four times a day (QID) | ORAL | Status: DC

## 2022-12-23 MED ORDER — CHLORHEXIDINE GLUCONATE 0.12 % MT SOLN
15.0000 mL | Freq: Once | OROMUCOSAL | Status: AC
  Administered 2022-12-23: 15 mL via OROMUCOSAL
  Filled 2022-12-23: qty 15

## 2022-12-23 SURGICAL SUPPLY — 71 items
ADH SKN CLS APL DERMABOND .7 (GAUZE/BANDAGES/DRESSINGS) ×2
ADH SKN CLS LQ APL DERMABOND (GAUZE/BANDAGES/DRESSINGS) ×1
APL PRP STRL LF DISP 70% ISPRP (MISCELLANEOUS) ×3
APPLIER CLIP 9.375 MED OPEN (MISCELLANEOUS) ×2 IMPLANT
APR CLP MED 9.3 20 MLT OPN (MISCELLANEOUS) ×2
BAG COUNTER SPONGE SURGICOUNT (BAG) ×2 IMPLANT
BAG DECANTER FOR FLEXI CONT (MISCELLANEOUS) ×1 IMPLANT
BAG SPNG CNTER NS LX DISP (BAG) ×2
BINDER BREAST LRG (GAUZE/BANDAGES/DRESSINGS) IMPLANT
BINDER BREAST XLRG (GAUZE/BANDAGES/DRESSINGS) IMPLANT
BIOPATCH RED 1 DISK 7.0 (GAUZE/BANDAGES/DRESSINGS) ×3 IMPLANT
CANISTER SUCT 3000ML PPV (MISCELLANEOUS) ×2 IMPLANT
CHLORAPREP W/TINT 26 (MISCELLANEOUS) ×2 IMPLANT
CLIP APPLIE 9.375 MED OPEN (MISCELLANEOUS) ×1 IMPLANT
COVER SURGICAL LIGHT HANDLE (MISCELLANEOUS) ×2 IMPLANT
DERMABOND ADVANCED .7 DNX12 (GAUZE/BANDAGES/DRESSINGS) ×2 IMPLANT
DERMABOND ADVANCED .7 DNX6 (GAUZE/BANDAGES/DRESSINGS) IMPLANT
DEVICE DSSCT PLSMBLD 3.0S LGHT (MISCELLANEOUS) ×1 IMPLANT
DRAIN CHANNEL 19F RND (DRAIN) ×2 IMPLANT
DRAPE HALF SHEET 40X57 (DRAPES) ×2 IMPLANT
DRAPE LAPAROSCOPIC ABDOMINAL (DRAPES) ×1 IMPLANT
DRAPE SURG 17X23 STRL (DRAPES) ×4 IMPLANT
DRAPE SURG ORHT 6 SPLT 77X108 (DRAPES) ×2 IMPLANT
DRAPE WARM FLUID 44X44 (DRAPES) ×1 IMPLANT
DRSG OPSITE 6X11 MED (GAUZE/BANDAGES/DRESSINGS) IMPLANT
DRSG OPSITE POSTOP 4X10 (GAUZE/BANDAGES/DRESSINGS) IMPLANT
ELECT BLADE 4.0 EZ CLEAN MEGAD (MISCELLANEOUS) ×1 IMPLANT
ELECT REM PT RETURN 9FT ADLT (ELECTROSURGICAL) ×2 IMPLANT
ELECTRODE BLDE 4.0 EZ CLN MEGD (MISCELLANEOUS) ×1 IMPLANT
ELECTRODE REM PT RTRN 9FT ADLT (ELECTROSURGICAL) ×2 IMPLANT
EVACUATOR SILICONE 100CC (DRAIN) ×2 IMPLANT
GAUZE PAD ABD 8X10 STRL (GAUZE/BANDAGES/DRESSINGS) ×4 IMPLANT
GAUZE SPONGE 2X2 8PLY STRL LF (GAUZE/BANDAGES/DRESSINGS) IMPLANT
GAUZE SPONGE 4X4 12PLY STRL (GAUZE/BANDAGES/DRESSINGS) ×2 IMPLANT
GLOVE BIO SURGEON STRL SZ 6.5 (GLOVE) ×1 IMPLANT
GLOVE BIO SURGEON STRL SZ7.5 (GLOVE) ×1 IMPLANT
GOWN STRL REUS W/ TWL LRG LVL3 (GOWN DISPOSABLE) ×4 IMPLANT
GOWN STRL REUS W/TWL LRG LVL3 (GOWN DISPOSABLE) ×4
IMPL EXPANDER BREAST 650CC (Breast) IMPLANT
IMPLANT EXPANDER BREAST 650CC (Breast) ×2 IMPLANT
KIT BASIN OR (CUSTOM PROCEDURE TRAY) ×2 IMPLANT
KIT FILL ASEPTIC TRANSFER (MISCELLANEOUS) IMPLANT
KIT TURNOVER KIT B (KITS) ×2 IMPLANT
LIGHT WAVEGUIDE WIDE FLAT (MISCELLANEOUS) IMPLANT
NS IRRIG 1000ML POUR BTL (IV SOLUTION) ×3 IMPLANT
PACK GENERAL/GYN (CUSTOM PROCEDURE TRAY) ×2 IMPLANT
PAD ARMBOARD 7.5X6 YLW CONV (MISCELLANEOUS) ×2 IMPLANT
PIN SAFETY STERILE (MISCELLANEOUS) ×1 IMPLANT
PLASMABLADE 3.0S W/LIGHT (MISCELLANEOUS) ×1 IMPLANT
SET ASEPTIC TRANSFER (MISCELLANEOUS) IMPLANT
SPECIMEN JAR X LARGE (MISCELLANEOUS) ×1 IMPLANT
STAPLER VISISTAT 35W (STAPLE) IMPLANT
SUT ETHILON 3 0 FSL (SUTURE) ×1 IMPLANT
SUT MNCRL AB 3-0 PS2 27 (SUTURE) IMPLANT
SUT MON AB 4-0 PC3 18 (SUTURE) ×1 IMPLANT
SUT MON AB 4-0 PS1 27 (SUTURE) ×2 IMPLANT
SUT MON AB 5-0 PS2 18 (SUTURE) ×2 IMPLANT
SUT PDS AB 2-0 CT1 27 (SUTURE) ×4 IMPLANT
SUT PDS AB 3-0 SH 27 (SUTURE) IMPLANT
SUT SILK 2 0 SH (SUTURE) IMPLANT
SUT SILK 3 0 SH 30 (SUTURE) ×1 IMPLANT
SUT VIC AB 3-0 54X BRD REEL (SUTURE) IMPLANT
SUT VIC AB 3-0 BRD 54 (SUTURE)
SUT VIC AB 3-0 SH 18 (SUTURE) ×1 IMPLANT
SUT VIC AB 3-0 SH 27 (SUTURE) ×6
SUT VIC AB 3-0 SH 27X BRD (SUTURE) ×3 IMPLANT
TISSUE FLEXHD PERF PLIAB 6X16 (Tissue) IMPLANT
TOWEL GREEN STERILE (TOWEL DISPOSABLE) ×2 IMPLANT
TOWEL GREEN STERILE FF (TOWEL DISPOSABLE) ×2 IMPLANT
TRAY FOLEY MTR SLVR 14FR STAT (SET/KITS/TRAYS/PACK) IMPLANT
TUBE CONNECTING 12X1/4 (SUCTIONS) ×1 IMPLANT

## 2022-12-23 NOTE — Anesthesia Procedure Notes (Signed)
Procedure Name: Intubation Date/Time: 12/23/2022 10:36 AM  Performed by: Caren Macadam, CRNAPre-anesthesia Checklist: Patient identified, Emergency Drugs available, Suction available and Patient being monitored Patient Re-evaluated:Patient Re-evaluated prior to induction Oxygen Delivery Method: Circle system utilized Preoxygenation: Pre-oxygenation with 100% oxygen Induction Type: IV induction Ventilation: Mask ventilation without difficulty Laryngoscope Size: Miller and 2 Grade View: Grade I Tube type: Oral Tube size: 7.0 mm Number of attempts: 1 Airway Equipment and Method: Stylet Placement Confirmation: ETT inserted through vocal cords under direct vision, positive ETCO2 and breath sounds checked- equal and bilateral Secured at: 23 cm Tube secured with: Tape Dental Injury: Teeth and Oropharynx as per pre-operative assessment

## 2022-12-23 NOTE — Transfer of Care (Signed)
Immediate Anesthesia Transfer of Care Note  Patient: Leah Shea  Procedure(s) Performed: BILATERAL SIMPLE MASTECTOMY (Bilateral) BREAST RECONSTRUCTION WITH PLACEMENT OF TISSUE EXPANDER (Bilateral)  Patient Location: PACU  Anesthesia Type:General  Level of Consciousness: awake  Airway & Oxygen Therapy: Patient Spontanous Breathing and Patient connected to nasal cannula oxygen  Post-op Assessment: Report given to RN and Post -op Vital signs reviewed and stable  Post vital signs: Reviewed and stable  Last Vitals:  Vitals Value Taken Time  BP 184/111 12/23/22 1415  Temp 36.5 C 12/23/22 1357  Pulse 91 12/23/22 1418  Resp 15 12/23/22 1418  SpO2 94 % 12/23/22 1418  Vitals shown include unfiled device data.  Last Pain:  Vitals:   12/23/22 1415  PainSc: 10-Worst pain ever         Complications: No notable events documented.

## 2022-12-23 NOTE — Anesthesia Procedure Notes (Signed)
Anesthesia Regional Block: Pectoralis block   Pre-Anesthetic Checklist: , timeout performed,  Correct Patient, Correct Site, Correct Laterality,  Correct Procedure, Correct Position, site marked,  Risks and benefits discussed,  Surgical consent,  Pre-op evaluation,  At surgeon's request and post-op pain management  Laterality: Left and Right  Prep: Maximum Sterile Barrier Precautions used, chloraprep       Needles:  Injection technique: Single-shot  Needle Type: Echogenic Stimulator Needle     Needle Length: 9cm  Needle Gauge: 22     Additional Needles:   Procedures:,,,, ultrasound used (permanent image in chart),,    Narrative:  Start time: 12/23/2022 9:00 AM End time: 12/23/2022 9:05 AM Injection made incrementally with aspirations every 5 mL.  Performed by: Personally  Anesthesiologist: Lannie Fields, DO  Additional Notes: Monitors applied. No increased pain on injection. No increased resistance to injection. Injection made in 5cc increments. Good needle visualization. Patient tolerated procedure well.

## 2022-12-23 NOTE — Op Note (Signed)
DATE OF OPERATION: 12/23/2022  LOCATION: [facility] Main operating Room  PREOPERATIVE DIAGNOSIS: Bilateral breast pain with bloody nipple discharge  POSTOPERATIVE DIAGNOSIS: Same  PROCEDURE: Bilateral tissue expander placement after simple mastectomy  SURGEON: Loren Racer, MD  ASSISTANT: Caroline More primary, Matt Scheeler  EBL: 50 cc  CONDITION: Stable  COMPLICATIONS: None  INDICATION: The patient, Leah Shea, is a 37 y.o. female born on February 03, 1986, is here for treatment bilateral breast pain with bloody nipple discharge.   PROCEDURE DETAILS:  The patient was seen prior to surgery and marked.   IV antibiotics were given. The patient was taken to the operating room and given a general anesthetic. A standard time out was performed and all information was confirmed by those in the room. SCDs were placed.   The chest was prepped and draped in usual sterile manner patient underwent bilateral simple mastectomies by Dr. Carolynne Edouard.  After completion of the mastectomies I assumed control the patient.  The left breast was addressed first.  I elevated the pectoralis muscle in the submuscular plane and divided the attachment for approximately 50% of the length of the muscle at the insertion above the rectus fascia.  I then elevated a cuff of serratus fascia.  A 6 x 16 portion of Flex HD acellular dermal matrix was prepared and sutured to the serratus fascia and to the lateral border of the pectoralis muscle with 3-0 Vicryl sutures.  Hemostasis was achieved with the electrocautery and the wound was irrigated with a saline and dilute Betadine solution.  Attention was turned to the right side where similar procedure was performed.  The pectoralis muscle was elevated in submuscular plane and divided at the insertion above the rectus fascia.  A 6 x 16 portion of Flex HD acellular dermal matrix was again prepared and sutured to the serratus fascia and the lateral portion of the pectoralis muscle.  Measuring the  pocket the patient could take a 13 cm diameter tissue expander.  I chose a 650 cc ultrahigh profile Mentor Arturo smooth tissue expander.  The tissue expanders were prepared on the back field and bathed in a saline and antibiotic solution.  The chest was reprepped with Betadine and the entire surgical team changed gloves.  Using previously and used retractors the tissue expanders were placed in the pockets and sutured to the chest wall with 3-0 Monocryl sutures.  The acellular dermal matrix was closed over each of the tissue expanders and  19 French round drains were placed in each cystectomy site.  The drains were brought out through separate stab incisions.  The skin incisions were then closed with 3-0 Monocryl sutures in the deep tissues.  The dermis was closed with a running 3-0 Monocryl suture and the skin was closed with a running 4-0 Monocryl subcuticular stitch.  After closing the incisions and the fill ports were located with a magnet and 150 mL of sterile saline were placed in each of the tissue expanders.  The incisions were sealed with Dermabond the patient was placed in a supportive garment and awakened from anesthesia without incident.  She was transferred to the recovery room in good condition.  All instrument needle and sponge counts were reported as correct and there were no obvious complications during the procedure. The patient was allowed to wake up and taken to recovery room in stable condition at the end of the case. The family was notified at the end of the case.   The advanced practice practitioner (APP) assisted throughout the case.  The APP was essential in retraction and counter traction when needed to make the case progress smoothly.  This retraction and assistance made it possible to see the tissue plans for the procedure.  The assistance was needed for blood control, tissue re-approximation and assisted with closure of the incision site.

## 2022-12-23 NOTE — Interval H&P Note (Signed)
History and Physical Interval Note:  12/23/2022 8:07 AM  Leah Shea  has presented today for surgery, with the diagnosis of BILATERAL BREAST PAIN WITH BLOODY NIPPLE DISCHARGE.  The various methods of treatment have been discussed with the patient and family. After consideration of risks, benefits and other options for treatment, the patient has consented to  Procedure(s) with comments: BILATERAL SIMPLE MASTECTOMY (Bilateral) - PEC BLOCK BREAST RECONSTRUCTION WITH PLACEMENT OF TISSUE EXPANDER (Bilateral) as a surgical intervention.  The patient's history has been reviewed, patient examined, no change in status, stable for surgery.  I have reviewed the patient's chart and labs.  Questions were answered to the patient's satisfaction.     Chevis Pretty III

## 2022-12-23 NOTE — Plan of Care (Signed)
Problem: Education: Goal: Knowledge of General Education information will improve Description: Including pain rating scale, medication(s)/side effects and non-pharmacologic comfort measures Outcome: Progressing Pt was admitted into the hospital for bloody nipple discharge of the right breast as well as milky like discharge from both breasts. She reports significant breast pain bilaterally.  Pt does have a strong family history of breast cancer in her mother, maternal grandmother, and maternal grandfather. D/t pain and her familial history she strongly desires to have bilateral mastectomies.  Informed her that focus for today is help manage her pain so that it could be tolerate.      Problem: Clinical Measurements: Goal: Will remain free from infection Outcome: Progressing S/Sx of infection monitored and assessed q-shift.  Pt has remained afebrile thus far.       Problem: Clinical Measurements: Goal: Respiratory complications will improve Outcome: Progressing Respiratory status monitored and assessed q-shift.  Pt is on room air with PO2 at 96% and respiration rate of 16 breaths per minute.  Pt has not endorsed c/o SOB or DOE.    Problem: Clinical Measurements: Goal: Cardiovascular complication will be avoided Outcome: Progressing Pt's VS WNL thus far.    Problem: Activity: Goal: Risk for activity intolerance will decrease Outcome: Progressing Pt is codependent of all her ADLs.  She cannot get up OOB without the assistance of RN staff.  Pt is x1 assist OOB.      Problem: Nutrition: Goal: Adequate nutrition will be maintained Outcome: Progressing Pt is regular diet per MD's orders.  She can tolerate her diet w/o s/sx of abdominal pain/ distention or n/v.    Problem: Safety: Goal: Ability to remain free from injury will improve Outcome: Progressing Pt has remained free from falls thus far.  Instructed pt to utilize RN call light for assistance.  Hourly rounds performed.  Bed in lowest  position, locked with two upper side rails engaged.  Belongings and call light within reach.    Problem: Skin Integrity: Goal: Risk for impaired skin integrity will decrease Outcome: Progressing Skin integrity monitored and assessed q-shift.  Instructed pt to turn q2 hours to prevent further skin impairment.  Tubes and drains assessed for device related pressures sores.  Pt is continent of both bowel and bladder.  Dressing changes performed per MD's orders.    Problem: Pain Management: Goal: General experience of comfort will improve Outcome: Not Progressing Pt has endorsed c/o 10/10 bilateral breast pain r/t her bilateral mastectomies describing it as a constant sharp, shooting, sore, stabbing, tender, throbbing, ache.  Reiterated pain scale so she could adequately rate her pain.  Pt stated her pain goal this admission would be 0/10.  Discussed nonpharmacological methods to help reduce s/sx of pain.  Interventions given per pt's request and MD's orders.

## 2022-12-23 NOTE — Progress Notes (Signed)
Leah Shea is approximately 8 hours postop from a bilateral mastectomy with mild bilateral tissue expander placement.  I was called earlier this evening regarding ongoing pain.  Modifications to her pain regimen were made and she still continued to have pain which she described as 10 out of 10.  On examination the surgical sites are normal in appearance.  There is no evidence of abnormal fluid collections and the drain output is serosanguineous.  I elected to remove fluid from her tissue expanders in an attempt to gain better pain control.  The fill ports were located with a magnet and the skin cleaned with alcohol.  The fill ports were accessed and 100 mL of saline was removed from each tissue expander.  The patient stated that she had almost immediate improvement in her pain though her pain is not completely resolved.  She is currently on oxycodone 10 mg every 4 hours as needed for moderate pain and Tylenol 650 mg every 6 hours as needed for mild pain.  She also has morphine ordered as a as needed dose for severe pain.  I have discontinued her morphine and added Dilaudid as well as increasing her Valium to 5 mg to improve her pain control.  Will keep her for 1 additional day to ensure pain is adequately controlled prior to her discharge home

## 2022-12-23 NOTE — Progress Notes (Signed)
Pt admitted from the PACU for bloody nipple discharge of the right breast as well as milky like discharge from both breasts. She reports significant breast pain bilaterally.  Pt does have a strong family history of breast cancer in her mother, maternal grandmother, and maternal grandfather. D/t pain and her familial history she strongly desires to have bilateral mastectomies. Pt was transported via bed escorted by 2 RN.  Assisted pt into her room and made pt aware of her surroundings.  Reviewed POC with pt.  Answered any pending questions.  Pt had no further questions.  Instructed pt to utilize RN call light for assistance.  Bed in lowest position, locked with two upper side rails engaged.  Belongings and call light within reach.

## 2022-12-23 NOTE — Op Note (Signed)
12/23/2022  12:02 PM  PATIENT:  Leah Shea  37 y.o. female  PRE-OPERATIVE DIAGNOSIS:  BILATERAL BREAST PAIN WITH BLOODY NIPPLE DISCHARGE  POST-OPERATIVE DIAGNOSIS:  BILATERAL BREAST PAIN WITH BLOODY NIPPLE DISCHARGE  PROCEDURE:  Procedure(s) with comments: BILATERAL SIMPLE MASTECTOMY (Bilateral) - PEC BLOCK  SURGEON:  Surgeons and Role: Panel 1:    * Griselda Miner, MD - Primary  PHYSICIAN ASSISTANT:   ASSISTANTS: none   ANESTHESIA:   general  EBL:  25 mL   BLOOD ADMINISTERED:none  DRAINS: none   LOCAL MEDICATIONS USED:  NONE  SPECIMEN:  Source of Specimen:  bilateral mastectomy  DISPOSITION OF SPECIMEN:  PATHOLOGY  COUNTS:  YES  TOURNIQUET:  * No tourniquets in log *  DICTATION: .Dragon Dictation  After informed consent was obtained the patient was brought to the operating room and placed in the supine position on the operating table.  After adequate induction of general anesthesia the patient's bilateral chest, breast, and axillary areas were prepped with ChloraPrep, allowed to dry, and draped in usual sterile manner.  An appropriate timeout was performed.  Attention was first turned to the left breast.  An elliptical incision was made around the nipple and areola complex in order to spare as much skin as possible.  The incision was carried through the skin and subcutaneous tissue sharply with the PlasmaBlade.  Breast hooks were used to elevate the skin flaps anteriorly towards the ceiling.  Thin skin flaps were then created by dissecting between the breast tissue and the subcutaneous fat and skin.  Once this dissection reached the chest wall circumferentially then the breast was removed from the pectoralis muscle with the pectoralis fascia.  Once this dissection was complete then the entire left breast was removed from the patient.  It was oriented with a stitch on the lateral skin and sent to pathology for further evaluation.  Hemostasis was achieved using the  PlasmaBlade.  The wound was then packed with a moistened lap sponge.  Attention was then turned to the right breast.  A similar elliptical incision was made around the nipple and areola complex in order to spare as much skin as possible.  The incision was carried through the skin and subcutaneous tissue sharply with the PlasmaBlade.  Breast hooks were used to elevate the skin flaps anteriorly towards the ceiling.  Thin skin flaps then created by dissecting between the breast tissue and the subcutaneous fat and skin.  This dissection was carried circumferentially all the way to the chest wall.  Next the breast was removed from the pectoralis muscle with the pectoralis fascia.  Once this dissection was complete then the entire right breast was removed from the patient.  It was oriented with this stitch on the lateral skin and sent to pathology for further evaluation wounds.  A couple of small perforating vessels medially were controlled with figure-of-eight 3-0 Vicryl stitches.  Laterally several intercostal brachial nerves and small vessels were can also controlled with clips.  Hemostasis was achieved using the PlasmaBlade.  The wound was then packed with a moistened lap sponge.  The patient tolerated the procedure well.  At the end of this portion all needle sponge and instrument counts were correct.  The patient was in stable condition.  The operation was then turned over to Dr. Ladona Ridgel for the reconstruction portion of the surgery.  His portion will be dictated separately.  PLAN OF CARE: Admit for overnight observation  PATIENT DISPOSITION:  PACU - hemodynamically stable.  Delay start of Pharmacological VTE agent (>24hrs) due to surgical blood loss or risk of bleeding: no

## 2022-12-23 NOTE — H&P (Signed)
REFERRING PHYSICIAN: Vertis Kelch, PA PROVIDER: Lindell Noe, MD MRN: [id number] DOB: 1985-07-09 Subjective   Chief Complaint: New Consultation  History of Present Illness: Leah Shea is a 37 y.o. female who is seen today as an office consultation for evaluation of New Consultation  We are asked to see the patient in consultation by Dr. Aniceto Boss to evaluate her for bloody nipple discharge. The patient is a 37 year old black female who has been experiencing bloody nipple discharge from the right breast on a daily basis. She has milky type discharge from both breasts also routinely on a daily basis. She reports significant breast pain bilaterally. She does have a strong family history of breast cancer in her mother, maternal grandmother, and maternal grandfather. Because of her pain and her family history she strongly desires bilateral mastectomies. She would be interested in reconstruction. 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 I have reviewed this information and discussed as appropriate with the patient. See HPI as well for other ROS.  ROS   Medical History: Past Medical History:  Diagnosis Date  Anemia  Anxiety  Asthma, unspecified asthma severity, unspecified whether complicated, unspecified whether persistent (HHS-HCC)  Hypertension  Seizures (CMS/HHS-HCC)  Sleep apnea   Patient Active Problem List  Diagnosis  Bloody discharge from right nipple   Past Surgical History:  Procedure Laterality Date  CESAREAN SECTION  HERNIA REPAIR    Allergies  Allergen Reactions  Reglan [Metoclopramide] Anxiety   Current Outpatient Medications on File Prior to Visit  Medication Sig Dispense Refill  acetaminophen (TYLENOL) 500 MG tablet TAKE TWO TABLETS BY MOUTH EVERY 6 HOURS AS NEEDED  busPIRone (BUSPAR) 15 MG tablet Take by mouth   celecoxib (CELEBREX) 100 MG capsule Take 100 mg by mouth 2 (two) times daily  cyclobenzaprine (FLEXERIL) 10 MG tablet Take by mouth  diphenhydrAMINE (BENADRYL) 25 mg tablet Take 1 tablet by mouth at bedtime  escitalopram oxalate (LEXAPRO) 10 MG tablet  hydrOXYzine HCL (ATARAX) 10 MG tablet Take 10 mg by mouth 3 (three) times daily as needed  levETIRAcetam (KEPPRA) 1000 MG tablet TAKE ONE TABLET BY MOUTH THREE TIMES A DAY FOR PARTIAL SEIZURES  LORazepam (ATIVAN) 0.5 MG tablet  metFORMIN (GLUCOPHAGE) 500 MG tablet Take by mouth  metoclopramide (REGLAN) 10 MG tablet 10 mg three times daily for 30 days, 10 mg twice daily for 4 days and 10 mg daily for 4 days  niFEdipine (PROCARDIA-XL) 60 MG (OSM) XL tablet  ondansetron (ZOFRAN-ODT) 4 MG disintegrating tablet  pantoprazole (PROTONIX) 40 MG DR tablet Take 40 mg by mouth once daily  gabapentin (NEURONTIN) 100 MG capsule Take 1 capsule (100 mg total) by mouth 3 (three) times daily 90 capsule 11   No current facility-administered medications on file prior to visit.   Family History  Problem Relation Age of Onset  High blood pressure (Hypertension) Mother  Hyperlipidemia (Elevated cholesterol) Mother  Diabetes Mother  Breast cancer Mother  Diabetes Father  High blood pressure (Hypertension) Father    Social History   Tobacco Use  Smoking Status Former  Smokeless Tobacco Never    Social History   Socioeconomic History  Marital status: Married  Tobacco Use  Smoking status: Former  Smokeless tobacco: Never  Substance and Sexual Activity  Alcohol use: Yes  Drug use: Yes  Types: Marijuana   Social Determinants of Health  Food Insecurity: No Food Insecurity (09/22/2020)  [facility]  Hunger Vital Sign  Worried About Running Out of Food in the Last Year: Never true  Ran Out of Food in the Last Year: Never true  Transportation Needs: No Transportation Needs (09/22/2020)  Received from [facility] -  Transportation  Lack of Transportation (Medical): No  Lack of Transportation (Non-Medical): No   Objective:   Vitals:  BP: 132/84  Weight: 88.1 kg (194 lb 3.2 oz)  Height: 166.4 cm (5' 5.5")  PainSc: 0-No pain   Body mass index is 31.83 kg/m.  Physical Exam Vitals reviewed.  Constitutional:  General: She is not in acute distress. Appearance: Normal appearance.  HENT:  Head: Normocephalic and atraumatic.  Right Ear: External ear normal.  Left Ear: External ear normal.  Nose: Nose normal.  Mouth/Throat:  Mouth: Mucous membranes are moist.  Pharynx: Oropharynx is clear.  Eyes:  General: No scleral icterus. Extraocular Movements: Extraocular movements intact.  Conjunctiva/sclera: Conjunctivae normal.  Pupils: Pupils are equal, round, and reactive to light.  Cardiovascular:  Rate and Rhythm: Normal rate and regular rhythm.  Pulses: Normal pulses.  Heart sounds: Normal heart sounds.  Pulmonary:  Effort: Pulmonary effort is normal. No respiratory distress.  Breath sounds: Normal breath sounds.  Abdominal:  General: Bowel sounds are normal.  Palpations: Abdomen is soft.  Tenderness: There is no abdominal tenderness.  Musculoskeletal:  General: No swelling, tenderness or deformity. Normal range of motion.  Cervical back: Normal range of motion and neck supple.  Skin: General: Skin is warm and dry.  Coloration: Skin is not jaundiced.  Neurological:  General: No focal deficit present.  Mental Status: She is alert and oriented to person, place, and time.  Psychiatric:  Mood and Affect: Mood normal.  Behavior: Behavior normal.     Breast: There are well-healed reduction scars bilaterally. She has no palpable mass in either breast. There is no palpable axillary, supraclavicular, or cervical lymphadenopathy. She has whitish discharge from both nipples with minimal compression of the breast. She reports pain bilaterally. There are no overlying skin changes.  Labs, Imaging  and Diagnostic Testing:  Assessment and Plan:   Diagnoses and all orders for this visit:  Bloody discharge from right nipple - Ambulatory Referral to Plastic Surgery - Ambulatory Referral to Cancer Genetics - [id number]   The patient appears to have significant bilateral breast pain with discharge from both nipples. She strongly desires bilateral mastectomies. She would be a candidate and may be interested in reconstruction. I have discussed with her in detail the risks and benefits of the operation as well as some of the technical aspects and she understands and wishes to proceed. I will refer her to plastic surgery to talk about reconstructive options. 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 We will wait for their evaluations and then we will begin surgical planning.

## 2022-12-23 NOTE — Anesthesia Postprocedure Evaluation (Signed)
Anesthesia Post Note  Patient: Leah Shea  Procedure(s) Performed: BILATERAL SIMPLE MASTECTOMY (Bilateral) BREAST RECONSTRUCTION WITH PLACEMENT OF TISSUE EXPANDER (Bilateral)     Patient location during evaluation: PACU Anesthesia Type: Regional and General Level of consciousness: awake and alert, oriented and patient cooperative Pain management: pain level controlled Vital Signs Assessment: post-procedure vital signs reviewed and stable Respiratory status: spontaneous breathing, nonlabored ventilation and respiratory function stable Cardiovascular status: blood pressure returned to baseline and stable Postop Assessment: no apparent nausea or vomiting Anesthetic complications: no   No notable events documented.  Last Vitals:  Vitals:   12/23/22 1430 12/23/22 1445  BP: (!) 167/112 (!) 143/101  Pulse: 86 87  Resp: 19 14  Temp:    SpO2: 97% 96%                 Lannie Fields

## 2022-12-24 ENCOUNTER — Encounter (HOSPITAL_COMMUNITY): Payer: Self-pay | Admitting: General Surgery

## 2022-12-24 DIAGNOSIS — Z791 Long term (current) use of non-steroidal anti-inflammatories (NSAID): Secondary | ICD-10-CM | POA: Diagnosis not present

## 2022-12-24 DIAGNOSIS — Z7984 Long term (current) use of oral hypoglycemic drugs: Secondary | ICD-10-CM | POA: Diagnosis not present

## 2022-12-24 DIAGNOSIS — F419 Anxiety disorder, unspecified: Secondary | ICD-10-CM | POA: Diagnosis present

## 2022-12-24 DIAGNOSIS — W010XXA Fall on same level from slipping, tripping and stumbling without subsequent striking against object, initial encounter: Secondary | ICD-10-CM | POA: Diagnosis not present

## 2022-12-24 DIAGNOSIS — Z1589 Genetic susceptibility to other disease: Secondary | ICD-10-CM | POA: Diagnosis not present

## 2022-12-24 DIAGNOSIS — F418 Other specified anxiety disorders: Secondary | ICD-10-CM | POA: Diagnosis not present

## 2022-12-24 DIAGNOSIS — J45909 Unspecified asthma, uncomplicated: Secondary | ICD-10-CM | POA: Diagnosis present

## 2022-12-24 DIAGNOSIS — R440 Auditory hallucinations: Secondary | ICD-10-CM | POA: Diagnosis not present

## 2022-12-24 DIAGNOSIS — Z888 Allergy status to other drugs, medicaments and biological substances status: Secondary | ICD-10-CM | POA: Diagnosis not present

## 2022-12-24 DIAGNOSIS — F515 Nightmare disorder: Secondary | ICD-10-CM | POA: Diagnosis not present

## 2022-12-24 DIAGNOSIS — Z634 Disappearance and death of family member: Secondary | ICD-10-CM | POA: Diagnosis not present

## 2022-12-24 DIAGNOSIS — D573 Sickle-cell trait: Secondary | ICD-10-CM | POA: Diagnosis present

## 2022-12-24 DIAGNOSIS — Z87891 Personal history of nicotine dependence: Secondary | ICD-10-CM | POA: Diagnosis not present

## 2022-12-24 DIAGNOSIS — T85848A Pain due to other internal prosthetic devices, implants and grafts, initial encounter: Secondary | ICD-10-CM | POA: Diagnosis not present

## 2022-12-24 DIAGNOSIS — G4733 Obstructive sleep apnea (adult) (pediatric): Secondary | ICD-10-CM | POA: Diagnosis present

## 2022-12-24 DIAGNOSIS — I10 Essential (primary) hypertension: Secondary | ICD-10-CM | POA: Diagnosis present

## 2022-12-24 DIAGNOSIS — Z79899 Other long term (current) drug therapy: Secondary | ICD-10-CM | POA: Diagnosis not present

## 2022-12-24 DIAGNOSIS — G8918 Other acute postprocedural pain: Secondary | ICD-10-CM | POA: Diagnosis not present

## 2022-12-24 DIAGNOSIS — G40319 Generalized idiopathic epilepsy and epileptic syndromes, intractable, without status epilepticus: Secondary | ICD-10-CM | POA: Diagnosis present

## 2022-12-24 DIAGNOSIS — Y92230 Patient room in hospital as the place of occurrence of the external cause: Secondary | ICD-10-CM | POA: Diagnosis not present

## 2022-12-24 DIAGNOSIS — Z803 Family history of malignant neoplasm of breast: Secondary | ICD-10-CM | POA: Diagnosis not present

## 2022-12-24 DIAGNOSIS — G43909 Migraine, unspecified, not intractable, without status migrainosus: Secondary | ICD-10-CM | POA: Diagnosis present

## 2022-12-24 DIAGNOSIS — Z8249 Family history of ischemic heart disease and other diseases of the circulatory system: Secondary | ICD-10-CM | POA: Diagnosis not present

## 2022-12-24 DIAGNOSIS — Y836 Removal of other organ (partial) (total) as the cause of abnormal reaction of the patient, or of later complication, without mention of misadventure at the time of the procedure: Secondary | ICD-10-CM | POA: Diagnosis not present

## 2022-12-24 DIAGNOSIS — F4310 Post-traumatic stress disorder, unspecified: Secondary | ICD-10-CM | POA: Diagnosis not present

## 2022-12-24 DIAGNOSIS — G40219 Localization-related (focal) (partial) symptomatic epilepsy and epileptic syndromes with complex partial seizures, intractable, without status epilepticus: Secondary | ICD-10-CM | POA: Diagnosis present

## 2022-12-24 DIAGNOSIS — N644 Mastodynia: Secondary | ICD-10-CM | POA: Diagnosis not present

## 2022-12-24 DIAGNOSIS — N6452 Nipple discharge: Secondary | ICD-10-CM | POA: Diagnosis present

## 2022-12-24 DIAGNOSIS — F32A Depression, unspecified: Secondary | ICD-10-CM | POA: Diagnosis present

## 2022-12-24 LAB — HIV ANTIBODY (ROUTINE TESTING W REFLEX): HIV Screen 4th Generation wRfx: NONREACTIVE

## 2022-12-24 MED ORDER — SODIUM CHLORIDE 0.9% FLUSH: 9.0000 mL | INTRAVENOUS | Status: DC | PRN

## 2022-12-24 MED ORDER — OXYCODONE HCL 5 MG PO TABS
10.0000 mg | ORAL_TABLET | ORAL | Status: DC | PRN
  Administered 2022-12-24 (×3): 10 mg via ORAL
  Filled 2022-12-24 (×3): qty 2

## 2022-12-24 MED ORDER — DIPHENHYDRAMINE HCL 50 MG/ML IJ SOLN: 12.5000 mg | Freq: Four times a day (QID) | INTRAMUSCULAR | Status: DC | PRN

## 2022-12-24 MED ORDER — DIAZEPAM 2 MG PO TABS
5.0000 mg | ORAL_TABLET | Freq: Three times a day (TID) | ORAL | Status: DC | PRN
  Administered 2022-12-24: 5 mg via ORAL

## 2022-12-24 MED ORDER — MORPHINE SULFATE 1 MG/ML IV SOLN PCA
INTRAVENOUS | Status: DC
Start: 2022-12-24 — End: 2022-12-25
  Administered 2022-12-24: 1 mg via INTRAVENOUS
  Filled 2022-12-24 (×3): qty 30

## 2022-12-24 MED ORDER — DIPHENHYDRAMINE HCL 12.5 MG/5ML PO ELIX: 12.5000 mg | ORAL_SOLUTION | Freq: Four times a day (QID) | ORAL | Status: DC | PRN

## 2022-12-24 MED ORDER — METHOCARBAMOL 500 MG PO TABS
500.0000 mg | ORAL_TABLET | Freq: Four times a day (QID) | ORAL | Status: DC | PRN
  Administered 2022-12-24 (×2): 500 mg via ORAL
  Filled 2022-12-24 (×2): qty 1

## 2022-12-24 MED ORDER — NALOXONE HCL 0.4 MG/ML IJ SOLN: 0.4000 mg | INTRAMUSCULAR | Status: DC | PRN

## 2022-12-24 MED ORDER — GABAPENTIN 100 MG PO CAPS
100.0000 mg | ORAL_CAPSULE | Freq: Three times a day (TID) | ORAL | Status: DC
  Administered 2022-12-24 – 2022-12-25 (×5): 100 mg via ORAL
  Filled 2022-12-24 (×5): qty 1

## 2022-12-24 MED ORDER — DEXTROSE-SODIUM CHLORIDE 5-0.45 % IV SOLN: INTRAVENOUS | Status: DC

## 2022-12-24 NOTE — Plan of Care (Signed)
Problem: Education: Goal: Knowledge of General Education information will improve Description: Including pain rating scale, medication(s)/side effects and non-pharmacologic comfort measures Outcome: Progressing Pt was admitted into the hospital for bloody nipple discharge of the right breast as well as milky like discharge from both breasts. She reports significant breast pain bilaterally.  Pt does have a strong family history of breast cancer in her mother, maternal grandmother, and maternal grandfather. D/t pain and her familial history she strongly desires to have bilateral mastectomies.  Informed her that the continuing POC is helping her manage her pain so that it could be tolerable.  Overnight pt stated that he pain was not well managed and she did not get any rest.  This AM gave pt all possible PRN pain meds she could have for that time frame per MD's orders.  At 0900-1300 pt was finally able to get sleep.  She was observed OOB in the recliner asleep w/o s/sx of pain or discomfort.  Pt stated she was able to rest during that duration.  After she awake at 1300 she called out for pain medication stating her pain was 7/10.  From that time forward her pain has exacerbated and not been managed.  Rona Ravens, MD to inform him of findings.  Medications were adjusted per Ladona Ridgel, MD's orders.  He informed me that if pt's pain is not well managed then the next plan of action would be to place pt on a PCA.       Problem: Clinical Measurements: Goal: Will remain free from infection Outcome: Progressing S/Sx of infection monitored and assessed q-shift.  Pt has remained afebrile thus far.       Problem: Clinical Measurements: Goal: Respiratory complications will improve Outcome: Progressing Respiratory status monitored and assessed q-shift.  Pt is on room air with PO2 at 97-98% and respiration rate of 17-19 breaths per minute.  Pt has not endorsed c/o SOB or DOE.    Problem: Clinical  Measurements: Goal: Cardiovascular complication will be avoided Outcome: Progressing Pt's VS WNL thus far.    Problem: Activity: Goal: Risk for activity intolerance will decrease Outcome: Progressing Pt is codependent of all her ADLs.  She cannot get up OOB without the assistance of RN staff.  Pt is x1 assist OOB.  Today she did get OOB to go to the bathroom and afterwards sat in the recliner from 0900-1430.      Problem: Nutrition: Goal: Adequate nutrition will be maintained Outcome: Progressing Pt is regular diet per MD's orders.  She can tolerate her diet w/o s/sx of abdominal pain/ distention or n/v.    Problem: Safety: Goal: Ability to remain free from injury will improve Outcome: Progressing Pt has remained free from falls thus far.  Instructed pt to utilize RN call light for assistance.  Hourly rounds performed.  Bed in lowest position, locked with two upper side rails engaged.  Belongings and call light within reach.    Problem: Skin Integrity: Goal: Risk for impaired skin integrity will decrease Outcome: Progressing Skin integrity monitored and assessed q-shift.  Instructed pt to turn q2 hours to prevent further skin impairment.  Tubes and drains assessed for device related pressures sores.  Pt is continent of both bowel and bladder.  Dressing changes performed per MD's orders.    Problem: Pain Management: Goal: General experience of comfort will improve Outcome: Not Progressing Pt has endorsed c/o 0-10/10 bilateral breast pain r/t her bilateral mastectomies describing it as a constant sharp, aching, pressure.  Reiterated pain  scale so she could adequately rate her pain.  Pt stated her pain goal this admission would be 0/10.  Discussed nonpharmacological methods to help reduce s/sx of pain.  Interventions given per pt's request and MD's orders.  Continue to informed pt that the continuing POC is helping her manage her pain so that it could be tolerable.  Overnight pt stated that  he pain was not well managed and she did not get any rest.  This AM gave pt all possible PRN pain meds she could have for that time frame per MD's orders.  At 0900-1300 pt was finally able to get sleep.  She was observed OOB in the recliner asleep w/o s/sx of pain or discomfort.  Pt stated she was able to rest during that duration.  After she awake at 1300 she called out for pain medication stating her pain was 7/10.  From that time forward her pain has exacerbated and not been managed.  Rona Ravens, MD to inform him of findings.  Medications were adjusted per Ladona Ridgel, MD's orders.  He informed me that if pt's pain is not well managed then the next plan of action would be to place pt on a PCA.

## 2022-12-24 NOTE — Progress Notes (Signed)
1 Day Post-Op   Subjective/Chief Complaint: Complains of severe pain   Objective: Vital signs in last 24 hours: Temp:  [97.7 F (36.5 C)-99.2 F (37.3 C)] 98.5 F (36.9 C) (11/09 0727) Pulse Rate:  [54-100] 60 (11/09 0727) Resp:  [9-19] 19 (11/09 0727) BP: (127-184)/(75-115) 144/95 (11/09 0727) SpO2:  [93 %-100 %] 99 % (11/09 0727) Weight:  [92.5 kg] 92.5 kg (11/08 0744) Last BM Date : 12/23/22  Intake/Output from previous day: 11/08 0701 - 11/09 0700 In: 1040 [P.O.:240; I.V.:700; IV Piggyback:100] Out: 160 [Drains:135; Blood:25] Intake/Output this shift: No intake/output data recorded.  General appearance: alert and cooperative Resp: clear to auscultation bilaterally Chest wall: skin flaps look good Cardio: regular rate and rhythm GI: soft, non-tender; bowel sounds normal; no masses,  no organomegaly  Lab Results:  Recent Labs    12/23/22 0807  WBC 8.0  HGB 11.0*  HCT 33.5*  PLT 234   BMET Recent Labs    12/23/22 0807  NA 139  K 3.3*  CL 108  CO2 19*  GLUCOSE 109*  BUN 13  CREATININE 0.95  CALCIUM 9.0   PT/INR No results for input(s): "LABPROT", "INR" in the last 72 hours. ABG No results for input(s): "PHART", "HCO3" in the last 72 hours.  Invalid input(s): "PCO2", "PO2"  Studies/Results: No results found.  Anti-infectives: Anti-infectives (From admission, onward)    Start     Dose/Rate Route Frequency Ordered Stop   12/24/22 1000  amoxicillin-clavulanate (AUGMENTIN) 875-125 MG per tablet 1 tablet        1 tablet Oral Every 12 hours 12/23/22 1408     12/23/22 1430  ceFAZolin (ANCEF) IVPB 1 g/50 mL premix        1 g 100 mL/hr over 30 Minutes Intravenous  Once 12/23/22 1404     12/23/22 1130  ceFAZolin 1 g / gentamicin 80 mg in NS 500 mL surgical irrigation         Irrigation  Once 12/23/22 1119 12/23/22 1148   12/23/22 0745  ceFAZolin (ANCEF) IVPB 2g/100 mL premix        2 g 200 mL/hr over 30 Minutes Intravenous On call to O.R. 12/23/22  0730 12/23/22 1047       Assessment/Plan: s/p Procedure(s) with comments: BILATERAL SIMPLE MASTECTOMY (Bilateral) - PEC BLOCK BREAST RECONSTRUCTION WITH PLACEMENT OF TISSUE EXPANDER (Bilateral) Advance diet Continue to work on pain control Not ready for d/c yet  LOS: 0 days    Leah Shea 12/24/2022

## 2022-12-24 NOTE — Progress Notes (Signed)
1 Day Post-Op  Subjective: Patient is a 37 year old female who underwent bilateral mastectomy with Dr. Carolynne Edouard yesterday followed by immediate breast reconstruction with placement of Flex HD and tissue expanders with Dr. Ladona Ridgel.  Intraoperatively, 150 cc of saline were placed in each expander.  She was admitted overnight for pain control and observation.  Patient is postop day 1.  Per chart review, last night patient had ongoing pain.  He removed 100 cc from each expander and adjusted her pain regimen.  Today, patient reports she is still having a lot of pain.  She describes the pain as tearing, and then also describes a muscle spasm.  She states that she has had a little bit of nausea, but has not needed the Zofran.  She states that she has been drinking without any issue.  She denies any emesis.  Objective: Vital signs in last 24 hours: Temp:  [97.7 F (36.5 C)-99.2 F (37.3 C)] 98.5 F (36.9 C) (11/09 0727) Pulse Rate:  [54-100] 60 (11/09 0727) Resp:  [9-19] 19 (11/09 0727) BP: (127-184)/(75-115) 144/95 (11/09 0727) SpO2:  [93 %-100 %] 99 % (11/09 0727) Last BM Date : 12/23/22  Intake/Output from previous day: 11/08 0701 - 11/09 0700 In: 1040 [P.O.:240; I.V.:700; IV Piggyback:100] Out: 160 [Drains:135; Blood:25] Intake/Output this shift: Total I/O In: 240 [P.O.:240] Out: -   General appearance: alert and cooperative, tearful Resp: Unlabored breathing no respiratory distress Breasts: Expanders are in place bilaterally.  They are soft.  Incisions are clean dry and intact.  There is no overlying erythema, no sign of hematoma or seroma on exam.  JP drains are in place and functioning bilaterally.  Minimal serosanguineous drainage in each of the bulbs.  Lab Results:     Latest Ref Rng & Units 12/23/2022    8:07 AM 11/21/2022    3:30 AM 07/06/2022    1:56 PM  CBC  WBC 4.0 - 10.5 K/uL 8.0  8.0  8.3   Hemoglobin 12.0 - 15.0 g/dL 16.1  09.6  04.5   Hematocrit 36.0 - 46.0 % 33.5  33.5   33.9   Platelets 150 - 400 K/uL 234  256  287     BMET Recent Labs    12/23/22 0807  NA 139  K 3.3*  CL 108  CO2 19*  GLUCOSE 109*  BUN 13  CREATININE 0.95  CALCIUM 9.0   PT/INR No results for input(s): "LABPROT", "INR" in the last 72 hours. ABG No results for input(s): "PHART", "HCO3" in the last 72 hours.  Invalid input(s): "PCO2", "PO2"  Studies/Results: No results found.  Anti-infectives: Anti-infectives (From admission, onward)    Start     Dose/Rate Route Frequency Ordered Stop   12/24/22 1000  amoxicillin-clavulanate (AUGMENTIN) 875-125 MG per tablet 1 tablet        1 tablet Oral Every 12 hours 12/23/22 1408     12/23/22 1430  ceFAZolin (ANCEF) IVPB 1 g/50 mL premix        1 g 100 mL/hr over 30 Minutes Intravenous  Once 12/23/22 1404     12/23/22 1130  ceFAZolin 1 g / gentamicin 80 mg in NS 500 mL surgical irrigation         Irrigation  Once 12/23/22 1119 12/23/22 1148   12/23/22 0745  ceFAZolin (ANCEF) IVPB 2g/100 mL premix        2 g 200 mL/hr over 30 Minutes Intravenous On call to O.R. 12/23/22 0730 12/23/22 1047       Assessment/Plan:  s/p Procedure(s): BILATERAL SIMPLE MASTECTOMY BREAST RECONSTRUCTION WITH PLACEMENT OF TISSUE EXPANDER Plan: -Patient postop day 1 still having a lot of pain.  Modified her oxycodone dose to 10 mg as this is what she takes at home.  Patient also currently is receiving Tylenol, Valium 5 mg, and Dilaudid as needed for severe pain. -Recommended the patient try to get up and out of her bed today and walk and sit in the chair. -Discussed with patient that she may ask nursing for her Zofran if she is feeling nauseous. -Will reevaluate tomorrow    LOS: 0 days    Laurena Spies, PA-C 12/24/2022

## 2022-12-25 ENCOUNTER — Inpatient Hospital Stay (HOSPITAL_COMMUNITY): Payer: No Typology Code available for payment source | Admitting: Anesthesiology

## 2022-12-25 ENCOUNTER — Encounter (HOSPITAL_COMMUNITY)
Admission: AD | Disposition: A | Discharge status: Home / Self Care | Payer: Self-pay | Source: Home / Self Care | Attending: Plastic Surgery

## 2022-12-25 ENCOUNTER — Encounter (HOSPITAL_COMMUNITY): Payer: Self-pay | Admitting: Plastic Surgery

## 2022-12-25 ENCOUNTER — Other Ambulatory Visit: Payer: Self-pay

## 2022-12-25 DIAGNOSIS — N644 Mastodynia: Secondary | ICD-10-CM | POA: Diagnosis not present

## 2022-12-25 DIAGNOSIS — G8918 Other acute postprocedural pain: Secondary | ICD-10-CM | POA: Diagnosis not present

## 2022-12-25 HISTORY — PX: REMOVAL OF TISSUE EXPANDER AND PLACEMENT OF IMPLANT: SHX6457

## 2022-12-25 SURGERY — REMOVAL, TISSUE EXPANDER, BREAST, WITH IMPLANT INSERTION
Anesthesia: General | Site: Chest | Laterality: Bilateral

## 2022-12-25 MED ORDER — 0.9 % SODIUM CHLORIDE (POUR BTL) OPTIME
TOPICAL | Status: DC | PRN
  Administered 2022-12-25: 1000 mL

## 2022-12-25 MED ORDER — CEFAZOLIN SODIUM-DEXTROSE 2-4 GM/100ML-% IV SOLN
INTRAVENOUS | Status: AC
  Filled 2022-12-25: qty 100

## 2022-12-25 MED ORDER — AMISULPRIDE (ANTIEMETIC) 5 MG/2ML IV SOLN: 10.0000 mg | Freq: Once | INTRAVENOUS | Status: AC

## 2022-12-25 MED ORDER — ACETAMINOPHEN 500 MG PO TABS: 1000.0000 mg | ORAL_TABLET | Freq: Once | ORAL | Status: DC | PRN

## 2022-12-25 MED ORDER — ROCURONIUM BROMIDE 10 MG/ML (PF) SYRINGE
PREFILLED_SYRINGE | INTRAVENOUS | Status: AC
Start: 2022-12-25 — End: ?
  Filled 2022-12-25: qty 10

## 2022-12-25 MED ORDER — DEXMEDETOMIDINE HCL IN NACL 80 MCG/20ML IV SOLN
INTRAVENOUS | Status: DC | PRN
  Administered 2022-12-25: 10 ug via INTRAVENOUS

## 2022-12-25 MED ORDER — HYDROMORPHONE HCL 1 MG/ML IJ SOLN
INTRAMUSCULAR | Status: DC | PRN
  Administered 2022-12-25: .5 mg via INTRAVENOUS

## 2022-12-25 MED ORDER — OXYCODONE HCL 5 MG PO TABS
ORAL_TABLET | ORAL | Status: AC
  Filled 2022-12-25: qty 1

## 2022-12-25 MED ORDER — DIPHENHYDRAMINE HCL 50 MG/ML IJ SOLN
25.0000 mg | Freq: Four times a day (QID) | INTRAMUSCULAR | Status: DC | PRN
  Administered 2022-12-25: 25 mg via INTRAVENOUS
  Filled 2022-12-25: qty 1

## 2022-12-25 MED ORDER — ORAL CARE MOUTH RINSE: 15.0000 mL | Freq: Once | OROMUCOSAL | Status: AC

## 2022-12-25 MED ORDER — OXYCODONE HCL 5 MG/5ML PO SOLN: 5.0000 mg | Freq: Once | ORAL | Status: AC | PRN

## 2022-12-25 MED ORDER — PROPOFOL 10 MG/ML IV BOLUS
INTRAVENOUS | Status: AC
  Filled 2022-12-25: qty 20

## 2022-12-25 MED ORDER — LIDOCAINE 2% (20 MG/ML) 5 ML SYRINGE
INTRAMUSCULAR | Status: AC
  Filled 2022-12-25: qty 5

## 2022-12-25 MED ORDER — PROMETHAZINE (PHENERGAN) 6.25MG IN NS 50ML IVPB
6.2500 mg | Freq: Once | INTRAVENOUS | Status: AC
  Administered 2022-12-25: 6.25 mg via INTRAVENOUS
  Filled 2022-12-25: qty 0.25

## 2022-12-25 MED ORDER — CHLORHEXIDINE GLUCONATE 0.12 % MT SOLN
OROMUCOSAL | Status: AC
  Administered 2022-12-25: 15 mL via OROMUCOSAL
  Filled 2022-12-25: qty 15

## 2022-12-25 MED ORDER — DIAZEPAM 2 MG PO TABS
5.0000 mg | ORAL_TABLET | Freq: Three times a day (TID) | ORAL | Status: DC | PRN
  Administered 2022-12-25 – 2022-12-26 (×2): 5 mg via ORAL
  Filled 2022-12-25 (×2): qty 3

## 2022-12-25 MED ORDER — EPHEDRINE SULFATE-NACL 50-0.9 MG/10ML-% IV SOSY
PREFILLED_SYRINGE | INTRAVENOUS | Status: DC | PRN
  Administered 2022-12-25: 10 mg via INTRAVENOUS

## 2022-12-25 MED ORDER — PHENYLEPHRINE 80 MCG/ML (10ML) SYRINGE FOR IV PUSH (FOR BLOOD PRESSURE SUPPORT)
PREFILLED_SYRINGE | INTRAVENOUS | Status: DC | PRN
  Administered 2022-12-25: 160 ug via INTRAVENOUS

## 2022-12-25 MED ORDER — HYDROMORPHONE HCL 1 MG/ML IJ SOLN
INTRAMUSCULAR | Status: AC
  Filled 2022-12-25: qty 0.5

## 2022-12-25 MED ORDER — OXYCODONE HCL 5 MG PO TABS
ORAL_TABLET | ORAL | Status: AC
  Administered 2022-12-25: 10 mg via ORAL
  Filled 2022-12-25: qty 1

## 2022-12-25 MED ORDER — CHLORHEXIDINE GLUCONATE 0.12 % MT SOLN: 15.0000 mL | Freq: Once | OROMUCOSAL | Status: AC

## 2022-12-25 MED ORDER — METHOCARBAMOL 750 MG PO TABS
750.0000 mg | ORAL_TABLET | Freq: Four times a day (QID) | ORAL | Status: DC | PRN
  Administered 2022-12-25: 750 mg via ORAL
  Filled 2022-12-25: qty 1

## 2022-12-25 MED ORDER — LIDOCAINE 2% (20 MG/ML) 5 ML SYRINGE
INTRAMUSCULAR | Status: DC | PRN
  Administered 2022-12-25: 60 mg via INTRAVENOUS

## 2022-12-25 MED ORDER — OXYCODONE HCL 5 MG PO TABS: 10.0000 mg | ORAL_TABLET | Freq: Four times a day (QID) | ORAL | Status: DC | PRN

## 2022-12-25 MED ORDER — MIDAZOLAM HCL 2 MG/2ML IJ SOLN
INTRAMUSCULAR | Status: DC | PRN
  Administered 2022-12-25: 2 mg via INTRAVENOUS

## 2022-12-25 MED ORDER — HYDROMORPHONE HCL 1 MG/ML IJ SOLN
1.0000 mg | INTRAMUSCULAR | Status: DC | PRN
  Administered 2022-12-25 – 2022-12-27 (×15): 1 mg via INTRAVENOUS
  Filled 2022-12-25 (×16): qty 1

## 2022-12-25 MED ORDER — GLYCOPYRROLATE 0.2 MG/ML IJ SOLN
INTRAMUSCULAR | Status: DC | PRN
  Administered 2022-12-25: .2 mg via INTRAVENOUS

## 2022-12-25 MED ORDER — HYDROMORPHONE HCL 1 MG/ML IJ SOLN
0.2500 mg | INTRAMUSCULAR | Status: DC | PRN
  Administered 2022-12-25 (×2): 0.5 mg via INTRAVENOUS

## 2022-12-25 MED ORDER — DIPHENHYDRAMINE HCL 50 MG/ML IJ SOLN
INTRAMUSCULAR | Status: AC
  Administered 2022-12-25: 12.5 mg via INTRAVENOUS
  Filled 2022-12-25: qty 1

## 2022-12-25 MED ORDER — HYDROMORPHONE HCL 1 MG/ML IJ SOLN
INTRAMUSCULAR | Status: AC
  Administered 2022-12-25: 0.5 mg via INTRAVENOUS
  Filled 2022-12-25: qty 1

## 2022-12-25 MED ORDER — ONDANSETRON 4 MG PO TBDP
4.0000 mg | ORAL_TABLET | Freq: Two times a day (BID) | ORAL | Status: DC
  Administered 2022-12-25 – 2023-01-01 (×15): 4 mg via ORAL
  Filled 2022-12-25 (×15): qty 1

## 2022-12-25 MED ORDER — ONDANSETRON HCL 4 MG/2ML IJ SOLN
INTRAMUSCULAR | Status: DC | PRN
  Administered 2022-12-25: 4 mg via INTRAVENOUS

## 2022-12-25 MED ORDER — DIPHENHYDRAMINE HCL 50 MG/ML IJ SOLN
12.5000 mg | Freq: Once | INTRAMUSCULAR | Status: AC
Start: 2022-12-25 — End: 2022-12-25

## 2022-12-25 MED ORDER — FENTANYL CITRATE (PF) 250 MCG/5ML IJ SOLN
INTRAMUSCULAR | Status: DC | PRN
  Administered 2022-12-25 (×2): 100 ug via INTRAVENOUS
  Administered 2022-12-25: 50 ug via INTRAVENOUS

## 2022-12-25 MED ORDER — PROPOFOL 10 MG/ML IV BOLUS
INTRAVENOUS | Status: DC | PRN
  Administered 2022-12-25: 200 mg via INTRAVENOUS

## 2022-12-25 MED ORDER — SODIUM CHLORIDE 0.9 % IV SOLN: INTRAVENOUS | Status: DC | PRN

## 2022-12-25 MED ORDER — FENTANYL CITRATE (PF) 250 MCG/5ML IJ SOLN
INTRAMUSCULAR | Status: AC
  Filled 2022-12-25: qty 5

## 2022-12-25 MED ORDER — CHLORHEXIDINE GLUCONATE CLOTH 2 % EX PADS
6.0000 | MEDICATED_PAD | Freq: Once | CUTANEOUS | Status: AC
  Administered 2022-12-25: 6 via TOPICAL

## 2022-12-25 MED ORDER — ACETAMINOPHEN 160 MG/5ML PO SOLN: 1000.0000 mg | Freq: Once | ORAL | Status: DC | PRN

## 2022-12-25 MED ORDER — AMISULPRIDE (ANTIEMETIC) 5 MG/2ML IV SOLN
INTRAVENOUS | Status: AC
  Administered 2022-12-25: 10 mg via INTRAVENOUS
  Filled 2022-12-25: qty 4

## 2022-12-25 MED ORDER — KETAMINE HCL 50 MG/5ML IJ SOSY
PREFILLED_SYRINGE | INTRAMUSCULAR | Status: AC
  Filled 2022-12-25: qty 5

## 2022-12-25 MED ORDER — FENTANYL CITRATE (PF) 100 MCG/2ML IJ SOLN: 25.0000 ug | INTRAMUSCULAR | Status: DC | PRN

## 2022-12-25 MED ORDER — OXYCODONE HCL 5 MG PO TABS
5.0000 mg | ORAL_TABLET | Freq: Once | ORAL | Status: AC | PRN
  Administered 2022-12-25: 5 mg via ORAL

## 2022-12-25 MED ORDER — ACETAMINOPHEN 10 MG/ML IV SOLN: 1000.0000 mg | Freq: Once | INTRAVENOUS | Status: DC | PRN

## 2022-12-25 MED ORDER — DEXAMETHASONE SODIUM PHOSPHATE 10 MG/ML IJ SOLN
INTRAMUSCULAR | Status: DC | PRN
  Administered 2022-12-25: 10 mg via INTRAVENOUS

## 2022-12-25 MED ORDER — ACETAMINOPHEN 10 MG/ML IV SOLN
INTRAVENOUS | Status: AC
  Administered 2022-12-25: 1000 mg via INTRAVENOUS
  Filled 2022-12-25: qty 100

## 2022-12-25 MED ORDER — KETAMINE HCL 10 MG/ML IJ SOLN
INTRAMUSCULAR | Status: DC | PRN
  Administered 2022-12-25: 50 mg via INTRAVENOUS

## 2022-12-25 MED ORDER — OXYCODONE HCL 5 MG PO TABS
15.0000 mg | ORAL_TABLET | Freq: Four times a day (QID) | ORAL | Status: DC | PRN
  Administered 2022-12-25 – 2022-12-26 (×4): 15 mg via ORAL
  Filled 2022-12-25 (×4): qty 3

## 2022-12-25 MED ORDER — MIDAZOLAM HCL 2 MG/2ML IJ SOLN
INTRAMUSCULAR | Status: AC
  Filled 2022-12-25: qty 2

## 2022-12-25 MED ORDER — CEFAZOLIN SODIUM-DEXTROSE 2-4 GM/100ML-% IV SOLN
2.0000 g | INTRAVENOUS | Status: AC
Start: 2022-12-25 — End: 2022-12-25
  Administered 2022-12-25: 2 g via INTRAVENOUS

## 2022-12-25 MED ORDER — CHLORHEXIDINE GLUCONATE CLOTH 2 % EX PADS: 6.0000 | MEDICATED_PAD | Freq: Once | CUTANEOUS | Status: DC

## 2022-12-25 SURGICAL SUPPLY — 47 items
ADH SKN CLS APL DERMABOND .7 (GAUZE/BANDAGES/DRESSINGS) ×2
APL PRP STRL LF DISP 70% ISPRP (MISCELLANEOUS) ×1
BAG COUNTER SPONGE SURGICOUNT (BAG) ×1 IMPLANT
BAG DECANTER FOR FLEXI CONT (MISCELLANEOUS) ×1 IMPLANT
BAG SPNG CNTER NS LX DISP (BAG) ×1
BINDER BREAST LRG (GAUZE/BANDAGES/DRESSINGS) IMPLANT
BINDER BREAST XLRG (GAUZE/BANDAGES/DRESSINGS) IMPLANT
BIOPATCH RED 1 DISK 7.0 (GAUZE/BANDAGES/DRESSINGS) ×1 IMPLANT
CANISTER SUCT 3000ML PPV (MISCELLANEOUS) ×1 IMPLANT
CHLORAPREP W/TINT 26 (MISCELLANEOUS) ×1 IMPLANT
COVER SURGICAL LIGHT HANDLE (MISCELLANEOUS) ×1 IMPLANT
DERMABOND ADVANCED .7 DNX12 (GAUZE/BANDAGES/DRESSINGS) ×1 IMPLANT
DRAIN CHANNEL 19F RND (DRAIN) ×1 IMPLANT
DRAPE HALF SHEET 40X57 (DRAPES) ×2 IMPLANT
DRAPE SURG 17X23 STRL (DRAPES) ×4 IMPLANT
DRAPE SURG ORHT 6 SPLT 77X108 (DRAPES) ×2 IMPLANT
DRSG MEPILEX POST OP 4X8 (GAUZE/BANDAGES/DRESSINGS) IMPLANT
DRSG TEGADERM 4X4.5 CHG (GAUZE/BANDAGES/DRESSINGS) IMPLANT
ELECT BLADE 4.0 EZ CLEAN MEGAD (MISCELLANEOUS) ×1 IMPLANT
ELECT REM PT RETURN 9FT ADLT (ELECTROSURGICAL) ×1 IMPLANT
ELECTRODE BLDE 4.0 EZ CLN MEGD (MISCELLANEOUS) ×1 IMPLANT
ELECTRODE REM PT RTRN 9FT ADLT (ELECTROSURGICAL) ×1 IMPLANT
EVACUATOR SILICONE 100CC (DRAIN) ×1 IMPLANT
GAUZE PAD ABD 7.5X8 STRL (GAUZE/BANDAGES/DRESSINGS) IMPLANT
GAUZE PAD ABD 8X10 STRL (GAUZE/BANDAGES/DRESSINGS) ×2 IMPLANT
GAUZE SPONGE 4X4 12PLY STRL (GAUZE/BANDAGES/DRESSINGS) ×1 IMPLANT
GLOVE BIO SURGEON STRL SZ 6.5 (GLOVE) ×1 IMPLANT
GOWN STRL REUS W/ TWL LRG LVL3 (GOWN DISPOSABLE) ×2 IMPLANT
GOWN STRL REUS W/TWL LRG LVL3 (GOWN DISPOSABLE) ×2
HEMOSTAT ARISTA ABSORB 3G PWDR (HEMOSTASIS) IMPLANT
KIT BASIN OR (CUSTOM PROCEDURE TRAY) ×1 IMPLANT
KIT TURNOVER KIT B (KITS) ×1 IMPLANT
NS IRRIG 1000ML POUR BTL (IV SOLUTION) ×2 IMPLANT
PACK GENERAL/GYN (CUSTOM PROCEDURE TRAY) ×1 IMPLANT
PAD ARMBOARD 7.5X6 YLW CONV (MISCELLANEOUS) ×1 IMPLANT
PIN SAFETY STERILE (MISCELLANEOUS) ×1 IMPLANT
STAPLER VISISTAT 35W (STAPLE) IMPLANT
SUT MNCRL AB 3-0 PS2 27 (SUTURE) IMPLANT
SUT MON AB 5-0 PS2 18 (SUTURE) ×2 IMPLANT
SUT PDS AB 2-0 CT1 27 (SUTURE) IMPLANT
SUT PDS AB 3-0 SH 27 (SUTURE) IMPLANT
SUT SILK 3 0 SH 30 (SUTURE) ×1 IMPLANT
SUT VIC AB 3-0 SH 27 (SUTURE) ×2
SUT VIC AB 3-0 SH 27X BRD (SUTURE) ×3 IMPLANT
SUT VICRYL 4-0 PS2 18IN ABS (SUTURE) ×2 IMPLANT
TOWEL GREEN STERILE (TOWEL DISPOSABLE) ×1 IMPLANT
TOWEL GREEN STERILE FF (TOWEL DISPOSABLE) ×1 IMPLANT

## 2022-12-25 NOTE — Transfer of Care (Signed)
Immediate Anesthesia Transfer of Care Note  Patient: Leah Shea  Procedure(s) Performed: REMOVAL OF BILATERAL BREAST TISSUE EXPANDERS (Bilateral: Chest)  Patient Location: PACU  Anesthesia Type:General  Level of Consciousness: drowsy and patient cooperative  Airway & Oxygen Therapy: Patient Spontanous Breathing and Patient connected to face mask oxygen  Post-op Assessment: Report given to RN and Post -op Vital signs reviewed and stable  Post vital signs: Reviewed and stable  Last Vitals:  Vitals Value Taken Time  BP 148/86 12/25/22 1150  Temp    Pulse 101 12/25/22 1152  Resp 12 12/25/22 1152  SpO2 98 % 12/25/22 1152  Vitals shown include unfiled device data.  Last Pain:  Vitals:   12/25/22 0944  TempSrc: Oral  PainSc:       Patients Stated Pain Goal: 0 (12/24/22 1716)  Complications: No notable events documented.

## 2022-12-25 NOTE — Op Note (Signed)
DATE OF OPERATION: 12/25/2022  LOCATION: [facility] Main operating Room  PREOPERATIVE DIAGNOSIS: Intractable breast pain after placement of tissue expanders for breast reconstruction  POSTOPERATIVE DIAGNOSIS: Same  PROCEDURE: Removal of bilateral tissue expanders, bilateral ADM, replacement of drains  SURGEON: Loren Racer, MD  ASSISTANT: Caroline More  EBL: 25 cc  CONDITION: Stable  COMPLICATIONS: None  INDICATION: The patient, Leah Shea, is a 37 y.o. female born on Aug 18, 1985, is here for treatment of pain that I have been unable to successfully treat since her surgery.  I have evaluated her extensively over the weekend modifying her pain medications, removing fluid from her tissue expanders, and I have been unable to bring her pain level to under a 7.  I discussed removal of the tissue expanders extensively with the patient and her husband as I saw this is the only neck step for gaining relief from her pain.  While disappointed they were in agreement.Marland Kitchen   PROCEDURE DETAILS:  The patient was seen prior to surgery and marked.   IV antibiotics were given. The patient was taken to the operating room and given a general anesthetic. A standard time out was performed and all information was confirmed by those in the room. SCDs were placed.   The chest was prepped and draped in usual sterile manner.  The left breast was addressed first.  The previously placed subcu reticular and dermal sutures were removed.  The wound bed was exposed.  There was minimal hematoma and there was nothing in the surgical wound to suggest a reason for the excessive pain.  The Vicryl's from the placement of the ADM were removed the ADM was removed and the tissue expander was removed.  The wound was irrigated with saline hemostasis was achieved.  A fresh 19 French round drain was placed.  The inferior attachment of the pectoralis was sutured to the rectus fascia with interrupted 3-0 Monocryl sutures and a single 3-0 Prolene  suture was placed on the lateral border of the pectoralis to mark that for identification when we returned.  The surgical bed was coated with Arista hemostatic agent.  The deep tissues were closed with interrupted 3-0 Monocryl sutures the dermis was closed with a running 3-0 Monocryl suture and the skin was closed with a running 4-0 Monocryl subcuticular stitch.  Attention was turned to the right side where the same type procedure was performed.  After opening the previous incision the tissue expander was exposed after removing the ADM.  The tissue expander was intact and it was also removed.  Again the attachment of the pectoralis was resutured to the border of the rectus fascia.  A marking suture was placed at the lateral border of the pectoralis.  The 74 French round drain was replaced.  The surgical wound was irrigated hemostasis achieved and it was coated with Arista hemostatic agent.  The deep tissues were closed with interrupted 3-0 Monocryl sutures the dermis was closed with a running 3-0 Monocryl dermal suture and the skin was closed with a running 4-0 Monocryl subcuticular stitch.  All incisions were sealed with Dermabond.  The patient was placed in a supportive garment and transferred to the recovery room in good condition.  All instrument needle and sponge counts were reported as correct and there were no complications noted during the procedure. The patient was allowed to wake up and taken to recovery room in stable condition at the end of the case. The family was notified at the end of the case.  The advanced practice practitioner (APP) assisted throughout the case.  The APP was essential in retraction and counter traction when needed to make the case progress smoothly.  This retraction and assistance made it possible to see the tissue plans for the procedure.  The assistance was needed for blood control, tissue re-approximation and assisted with closure of the incision site.

## 2022-12-25 NOTE — Progress Notes (Addendum)
Patient assisted to bathroom to void. Patient reports trouble urinating, only voided 100 mls but still has urge to urinate. Bladder scan performed, patient has 450 mls in bladder. Sent page to team, see orders.   Straight cath performed w/ NT present.

## 2022-12-25 NOTE — Anesthesia Procedure Notes (Signed)
Procedure Name: LMA Insertion Date/Time: 12/25/2022 10:18 AM  Performed by: April Holding, CRNAPre-anesthesia Checklist: Patient identified, Emergency Drugs available, Suction available and Patient being monitored Patient Re-evaluated:Patient Re-evaluated prior to induction Oxygen Delivery Method: Circle System Utilized Preoxygenation: Pre-oxygenation with 100% oxygen Induction Type: IV induction Ventilation: Mask ventilation without difficulty LMA: LMA inserted LMA Size: 4.0 Number of attempts: 1 Airway Equipment and Method: Bite block Placement Confirmation: positive ETCO2 Tube secured with: Tape Dental Injury: Teeth and Oropharynx as per pre-operative assessment

## 2022-12-25 NOTE — Progress Notes (Signed)
Leah Shea is postop day 2 from bilateral simple mastectomies with immediate placement of tissue expanders.  She has had almost continuous pain since surgery.  We have tried oral pain medications supplemented with IV pain medications and a PCA yesterday.  I have removed 100 mL of the saline from her tissue expanders to decrease the stretch on her pectoralis muscles.  We have been unable to get her pain below a 7.  This morning she now has intractable nausea and urinary retention most likely from the amount of pain medication she is taking.  On physical examination the incisions are clean dry and intact there is no evidence of hematoma the surgical site looks very good.  Her drain output is minimal and serosanguineous.  At this point I have run out of options for pain control for the patient.  I have discussed at length with her and her husband that I believe the only way we are can get relief is to remove her tissue expanders.  She had an opportunity to have a long discussion with her husband regarding this procedure.  The 2 of them have decided that they would like to move forward with the removal of tissue expanders.  Once she is completely healed he can revisit placement of tissue expanders if she likes or she can be referred for discussion of free tissue transfer.  Will proceed with removal of her tissue expanders this morning.

## 2022-12-25 NOTE — Progress Notes (Addendum)
Patient reports morphine PCA not controlling her pain, still reports 9/10 pain w/ PCA use. Paged on call, spoke to [facility], verbal orders to increase robaxin, no changes to PCA at this time. Relayed POC to patient. Patient understandable, but tearful

## 2022-12-25 NOTE — Anesthesia Postprocedure Evaluation (Signed)
Anesthesia Post Note  Patient: Leah Shea  Procedure(s) Performed: REMOVAL OF BILATERAL BREAST TISSUE EXPANDERS (Bilateral: Chest)     Patient location during evaluation: PACU Anesthesia Type: General Level of consciousness: awake and alert Pain management: pain level controlled Vital Signs Assessment: post-procedure vital signs reviewed and stable Respiratory status: spontaneous breathing, nonlabored ventilation and respiratory function stable Cardiovascular status: blood pressure returned to baseline and stable Postop Assessment: no apparent nausea or vomiting Anesthetic complications: no   No notable events documented.  Last Vitals:  Vitals:   12/25/22 1424 12/25/22 1547  BP: 124/72 129/88  Pulse: 88 90  Resp: 16   Temp: 37.3 C   SpO2: 100% 100%                Nicklos Gaxiola

## 2022-12-25 NOTE — Progress Notes (Signed)
Pt taken for a walk in the hallway. Slight dizziness and pain but overall tolerated well.

## 2022-12-25 NOTE — Progress Notes (Signed)
Pt is unsure about proceeding with surgery today. Notified MD.

## 2022-12-25 NOTE — H&P (View-Only) (Signed)
Leah Shea is postop day 2 from bilateral simple mastectomies with immediate placement of tissue expanders.  She has had almost continuous pain since surgery.  We have tried oral pain medications supplemented with IV pain medications and a PCA yesterday.  I have removed 100 mL of the saline from her tissue expanders to decrease the stretch on her pectoralis muscles.  We have been unable to get her pain below a 7.  This morning she now has intractable nausea and urinary retention most likely from the amount of pain medication she is taking.  On physical examination the incisions are clean dry and intact there is no evidence of hematoma the surgical site looks very good.  Her drain output is minimal and serosanguineous.  At this point I have run out of options for pain control for the patient.  I have discussed at length with her and her husband that I believe the only way we are can get relief is to remove her tissue expanders.  She had an opportunity to have a long discussion with her husband regarding this procedure.  The 2 of them have decided that they would like to move forward with the removal of tissue expanders.  Once she is completely healed he can revisit placement of tissue expanders if she likes or she can be referred for discussion of free tissue transfer.  Will proceed with removal of her tissue expanders this morning.

## 2022-12-25 NOTE — Interval H&P Note (Signed)
History and Physical Interval Note: Worsening pain at site of mastectomies and tissue expander placement. I have recommended to the patient that she have her tissue expanders removed and she has agreed. All questions answered to her satisfaction.  Additionally I discussed this via phone with her husband who also agrees. Will proceed with removal of tissue expanders at her request.  12/25/2022 9:25 AM  Leah Shea  has presented today for surgery, with the diagnosis of pain breast.  The various methods of treatment have been discussed with the patient and family. After consideration of risks, benefits and other options for treatment, the patient has consented to  Procedure(s): REMOVAL OF TISSUE EXPANDER AND PLACEMENT OF IMPLANT (Bilateral) as a surgical intervention.  The patient's history has been reviewed, patient examined, no change in status, stable for surgery.  I have reviewed the patient's chart and labs.  Questions were answered to the patient's satisfaction.     Santiago Glad

## 2022-12-25 NOTE — Anesthesia Preprocedure Evaluation (Signed)
Anesthesia Evaluation  Patient identified by MRN, date of birth, ID band Patient awake    Reviewed: Allergy & Precautions, NPO status , Patient's Chart, lab work & pertinent test results  History of Anesthesia Complications Negative for: history of anesthetic complications  Airway Mallampati: III  TM Distance: >3 FB Neck ROM: Full    Dental  (+) Teeth Intact, Dental Advisory Given   Pulmonary shortness of breath, asthma , sleep apnea , former smoker   breath sounds clear to auscultation       Cardiovascular hypertension,  Rhythm:Regular     Neuro/Psych  Headaches, Seizures -, Well Controlled,  PSYCHIATRIC DISORDERS Anxiety Depression       GI/Hepatic Neg liver ROS, hiatal hernia,GERD  Medicated and Controlled,,  Endo/Other  diabetes  No results found for: "HGBA1C"   Renal/GU negative Renal ROS     Musculoskeletal   Abdominal   Peds  Hematology  (+) Blood dyscrasia, anemia Lab Results      Component                Value               Date                      WBC                      8.0                 12/23/2022                HGB                      11.0 (L)            12/23/2022                HCT                      33.5 (L)            12/23/2022                MCV                      87.5                12/23/2022                PLT                      234                 12/23/2022              Anesthesia Other Findings   Reproductive/Obstetrics                             Anesthesia Physical Anesthesia Plan  ASA: 2  Anesthesia Plan: General   Post-op Pain Management: Ofirmev IV (intra-op)*, Precedex and Ketamine IV*   Induction: Intravenous  PONV Risk Score and Plan: 4 or greater and Ondansetron, Dexamethasone and Midazolam  Airway Management Planned:   Additional Equipment: None  Intra-op Plan:   Post-operative Plan: Extubation in OR  Informed Consent: I have  reviewed the patients History and Physical, chart, labs and discussed the procedure including the  risks, benefits and alternatives for the proposed anesthesia with the patient or authorized representative who has indicated his/her understanding and acceptance.     Dental advisory given  Plan Discussed with: CRNA  Anesthesia Plan Comments:        Anesthesia Quick Evaluation

## 2022-12-26 ENCOUNTER — Encounter (HOSPITAL_COMMUNITY): Payer: Self-pay | Admitting: Plastic Surgery

## 2022-12-26 LAB — SURGICAL PATHOLOGY

## 2022-12-26 MED ORDER — HYDROCODONE-ACETAMINOPHEN 7.5-325 MG PO TABS
1.0000 | ORAL_TABLET | ORAL | Status: DC | PRN
  Administered 2022-12-26: 1 via ORAL
  Administered 2022-12-26 – 2022-12-27 (×2): 2 via ORAL
  Filled 2022-12-26 (×3): qty 2

## 2022-12-26 MED ORDER — METHOCARBAMOL 750 MG PO TABS
750.0000 mg | ORAL_TABLET | Freq: Four times a day (QID) | ORAL | Status: DC | PRN
  Administered 2022-12-26 – 2023-01-01 (×18): 750 mg via ORAL
  Filled 2022-12-26 (×18): qty 1

## 2022-12-26 MED ORDER — HYDROCODONE-ACETAMINOPHEN 5-325 MG PO TABS
1.0000 | ORAL_TABLET | ORAL | Status: DC | PRN
  Administered 2022-12-27 (×2): 1 via ORAL
  Filled 2022-12-26 (×2): qty 1

## 2022-12-26 MED ORDER — GABAPENTIN 300 MG PO CAPS
300.0000 mg | ORAL_CAPSULE | Freq: Three times a day (TID) | ORAL | Status: DC
  Administered 2022-12-26 – 2023-01-01 (×18): 300 mg via ORAL
  Filled 2022-12-26 (×18): qty 1

## 2022-12-26 MED ORDER — DIPHENHYDRAMINE HCL 25 MG PO CAPS
25.0000 mg | ORAL_CAPSULE | Freq: Four times a day (QID) | ORAL | Status: DC | PRN
  Administered 2022-12-26 – 2022-12-27 (×3): 25 mg via ORAL
  Filled 2022-12-26 (×3): qty 1

## 2022-12-26 MED ORDER — ACETAMINOPHEN 325 MG PO TABS
650.0000 mg | ORAL_TABLET | Freq: Four times a day (QID) | ORAL | Status: DC | PRN
  Administered 2022-12-26: 650 mg via ORAL
  Filled 2022-12-26: qty 2

## 2022-12-26 MED ORDER — OXYCODONE HCL 5 MG PO TABS: 10.0000 mg | ORAL_TABLET | ORAL | Status: DC | PRN

## 2022-12-26 MED ORDER — GABAPENTIN 100 MG PO CAPS
200.0000 mg | ORAL_CAPSULE | Freq: Three times a day (TID) | ORAL | Status: DC
  Administered 2022-12-26 (×2): 200 mg via ORAL
  Filled 2022-12-26 (×2): qty 2

## 2022-12-26 MED ORDER — OXYCODONE HCL 5 MG PO TABS: 15.0000 mg | ORAL_TABLET | ORAL | Status: DC | PRN

## 2022-12-26 NOTE — Plan of Care (Signed)

## 2022-12-26 NOTE — Progress Notes (Signed)
Spoke with Ms Hainer this evening. She still complains of severe pain which is uncontrolled by oxycodone 15 mg every 4 hours. She is awake and alert, tearful, appropriate with no evidence of over sedation.  As noted earlier, there is no finding on physical exam to account for her pain.  I discussed her current pain regimen with her. She is unable to take NSAIDS which would be my go to for post operative pain associated with manipulation of the pectoralis muscle. I discussed  changing from oxycodone to hyrdocodone. She is will ing to try. Will use 5 mg every 4 hours as needed for moderate ( 4-7) pain and 7.5- 15 mg every 4 hour as needed for severe (7-10) pain.  Anticipate at least one more day in the hospital.

## 2022-12-26 NOTE — Progress Notes (Signed)
   12/26/22 1535  Spiritual Encounters  Type of Visit Initial  Care provided to: Patient  Conversation partners present during encounter Nurse  Referral source Nurse (RN/NT/LPN)  Reason for visit Routine spiritual support  OnCall Visit No   Visited with patient per spiritual consult. Third attempt today as patient was not available earlier. Patient very tearful as she is experiencing much pain after double mastectomy. Patient also experiencing pain for prior condition of which she was medicated for.  Patient has had several surgeries and alleges she has never felt so much pain. Patient feels she is not being heard or taken seriously due to meds prescribed prior to this hospitalization.    Patient also concerned about her 5 children and her husband who like her is also a disabled Administrator, [employer]. She wants to heal so she can go home and care for children.   Remained with patient for reflective listening and prayer.  Sending referral to CSW for further assistance.

## 2022-12-26 NOTE — Plan of Care (Signed)
?  Problem: Education: ?Goal: Knowledge of General Education information will improve ?Description: Including pain rating scale, medication(s)/side effects and non-pharmacologic comfort measures ?Outcome: Progressing ?  ?Problem: Clinical Measurements: ?Goal: Ability to maintain clinical measurements within normal limits will improve ?Outcome: Progressing ?Goal: Will remain free from infection ?Outcome: Progressing ?Goal: Diagnostic test results will improve ?Outcome: Progressing ?Goal: Respiratory complications will improve ?Outcome: Progressing ?Goal: Cardiovascular complication will be avoided ?Outcome: Progressing ?  ?Problem: Nutrition: ?Goal: Adequate nutrition will be maintained ?Outcome: Progressing ?  ?Problem: Elimination: ?Goal: Will not experience complications related to bowel motility ?Outcome: Progressing ?Goal: Will not experience complications related to urinary retention ?Outcome: Progressing ?  ?Problem: Safety: ?Goal: Ability to remain free from injury will improve ?Outcome: Progressing ?  ?Problem: Skin Integrity: ?Goal: Risk for impaired skin integrity will decrease ?Outcome: Progressing ?  ?

## 2022-12-26 NOTE — TOC CM/SW Note (Signed)
Transition of Care [facility]) - Inpatient Brief Assessment   Patient Details  Name: Brice Salsabil Sharpe MRN: [id number] Date of Birth: [birth year]  Transition of Care [facility]) CM/SW Contact:    Harriet Masson, RN Phone Number: 12/26/2022, 12:54 PM   Clinical Narrative:  Post OP day 1 for bilateral mastectomies. No TOC needs at this time.  Transition of Care Asessment: Insurance and Status: Insurance coverage has been reviewed Patient has primary care physician: Yes Home environment has been reviewed: safe to discharge home when medically stable Prior level of function:: independent Prior/Current Home Services: No current home services Social Determinants of Health Reivew: SDOH reviewed no interventions necessary Readmission risk has been reviewed: Yes Transition of care needs: no transition of care needs at this time

## 2022-12-26 NOTE — Progress Notes (Addendum)
1 Day Post-Op  Subjective: Patient is a 37 year old female who underwent bilateral mastectomies with Dr. Carolynne Edouard followed by immediate breast reconstruction with Dr. Ladona Ridgel on 12/23/2022.  She was admitted to the floor for observation and pain control.  Over the days following the surgery, patient's pain was unable to be controlled with medications.  She subsequently underwent expander removal yesterday with Dr. Ladona Ridgel on 12/25/2022.  She is postop day 1 from expander removal.  Today, patient is tearful upon entering the room.  She states that her pain is better and that she has had some relief since her expander removal, but she is overall still experiencing pain that is about an 8 out of 10.  She states that she woke up in pain but did not ask for her pain medications.  Patient reports that she has been eating and drinking without issue.  She states that she has been up and going to the bathroom and voiding on her own without any retention.  Objective: Vital signs in last 24 hours: Temp:  [97.6 F (36.4 C)-99.2 F (37.3 C)] 99.1 F (37.3 C) (11/11 0807) Pulse Rate:  [62-102] 83 (11/11 0807) Resp:  [7-19] 16 (11/11 0807) BP: (123-146)/(72-93) 145/93 (11/11 0807) SpO2:  [95 %-100 %] 95 % (11/11 0807) Weight:  [92.5 kg] 92.5 kg (11/10 0941) Last BM Date : 12/23/22  Intake/Output from previous day: 11/10 0701 - 11/11 0700 In: 450 [I.V.:450] Out: 600 [Urine:500; Drains:75; Blood:25] Intake/Output this shift: Total I/O In: -  Out: 70 [Drains:70]  General appearance: alert and cooperative, tearful Resp: Unlabored breathing, no respiratory distress Chest wall: Incisions are clean dry and intact bilaterally.  There are no obvious fluid collections on exam.  No concerning signs for hematoma or seroma.  JP drains are in place and functioning bilaterally.  There is minimal serosanguineous drainage in each of the bulbs.  Lab Results:     Latest Ref Rng & Units 12/23/2022    8:07 AM 11/21/2022     3:30 AM 07/06/2022    1:56 PM  CBC  WBC 4.0 - 10.5 K/uL 8.0  8.0  8.3   Hemoglobin 12.0 - 15.0 g/dL 16.1  09.6  04.5   Hematocrit 36.0 - 46.0 % 33.5  33.5  33.9   Platelets 150 - 400 K/uL 234  256  287     BMET No results for input(s): "NA", "K", "CL", "CO2", "GLUCOSE", "BUN", "CREATININE", "CALCIUM" in the last 72 hours. PT/INR No results for input(s): "LABPROT", "INR" in the last 72 hours. ABG No results for input(s): "PHART", "HCO3" in the last 72 hours.  Invalid input(s): "PCO2", "PO2"  Studies/Results: No results found.  Anti-infectives: Anti-infectives (From admission, onward)    Start     Dose/Rate Route Frequency Ordered Stop   12/25/22 0945  ceFAZolin (ANCEF) IVPB 2g/100 mL premix        2 g 200 mL/hr over 30 Minutes Intravenous On call to O.R. 12/25/22 0936 12/25/22 1011   12/25/22 0942  ceFAZolin (ANCEF) 2-4 GM/100ML-% IVPB       Note to Pharmacy: Aquilla Hacker M: cabinet override      12/25/22 0942 12/25/22 1012   12/24/22 1000  amoxicillin-clavulanate (AUGMENTIN) 875-125 MG per tablet 1 tablet  Status:  Discontinued        1 tablet Oral Every 12 hours 12/23/22 1408 12/25/22 0919   12/23/22 1430  ceFAZolin (ANCEF) IVPB 1 g/50 mL premix  Status:  Discontinued        1  g 100 mL/hr over 30 Minutes Intravenous  Once 12/23/22 1404 12/25/22 0919   12/23/22 1130  ceFAZolin 1 g / gentamicin 80 mg in NS 500 mL surgical irrigation         Irrigation  Once 12/23/22 1119 12/23/22 1148   12/23/22 0745  ceFAZolin (ANCEF) IVPB 2g/100 mL premix        2 g 200 mL/hr over 30 Minutes Intravenous On call to O.R. 12/23/22 0730 12/23/22 1047       Assessment/Plan: s/p Procedure(s): REMOVAL OF BILATERAL BREAST TISSUE EXPANDERS Plan: -Patient postop day 1 from removal of her tissue expanders.  She is still experiencing pain.  Discussed with her that she should ask for her pain medications when she is having pain so that she can stay on top of it.  Patient expressed  understanding. -Patient has allergy to NSAIDs, but per allergy review, it appears she has taken Celebrex in the past.  I did discuss this with the patient, but she was unaware and did not remember taking Celebrex and states that she avoids NSAIDs in general. -Tylenol has been ordered as well as her gabapentin increased, D/C'd valium and added robaxin Q6HR -I encouraged the patient to continue to get up and try to ambulate as well as make sure she is eating and drinking.  Patient expressed understanding. -We will plan to keep the patient for continued pain and work on her pain control.  I did discuss plan with Dr. Ladona Ridgel.  LOS: 2 days    Laurena Spies, PA-C 12/26/2022

## 2022-12-27 ENCOUNTER — Telehealth: Payer: Self-pay | Admitting: Plastic Surgery

## 2022-12-27 MED ORDER — DIPHENHYDRAMINE HCL 12.5 MG/5ML PO ELIX
12.5000 mg | ORAL_SOLUTION | Freq: Four times a day (QID) | ORAL | Status: DC | PRN
Start: 2022-12-27 — End: 2022-12-30

## 2022-12-27 MED ORDER — SODIUM CHLORIDE 0.9% FLUSH: 9.0000 mL | INTRAVENOUS | Status: DC | PRN

## 2022-12-27 MED ORDER — HYDROMORPHONE 1 MG/ML IV SOLN
INTRAVENOUS | Status: DC
  Administered 2022-12-27: 30 mg via INTRAVENOUS
  Filled 2022-12-27 (×2): qty 30

## 2022-12-27 MED ORDER — POLYETHYLENE GLYCOL 3350 17 G PO PACK
17.0000 g | PACK | Freq: Every day | ORAL | Status: DC
  Administered 2022-12-27 – 2022-12-31 (×5): 17 g via ORAL
  Filled 2022-12-27 (×5): qty 1

## 2022-12-27 MED ORDER — NALOXONE HCL 0.4 MG/ML IJ SOLN
0.4000 mg | INTRAMUSCULAR | Status: DC | PRN
Start: 2022-12-27 — End: 2022-12-30

## 2022-12-27 MED ORDER — DIPHENHYDRAMINE HCL 50 MG/ML IJ SOLN
12.5000 mg | Freq: Four times a day (QID) | INTRAMUSCULAR | Status: DC | PRN
Start: 2022-12-27 — End: 2022-12-30
  Administered 2022-12-28 – 2022-12-30 (×5): 12.5 mg via INTRAVENOUS
  Filled 2022-12-27 (×5): qty 1

## 2022-12-27 MED ORDER — ONDANSETRON HCL 4 MG/2ML IJ SOLN: 4.0000 mg | Freq: Four times a day (QID) | INTRAMUSCULAR | Status: DC | PRN

## 2022-12-27 MED ORDER — ENOXAPARIN SODIUM 40 MG/0.4ML IJ SOSY
40.0000 mg | PREFILLED_SYRINGE | INTRAMUSCULAR | Status: DC
  Administered 2022-12-28 – 2023-01-01 (×5): 40 mg via SUBCUTANEOUS
  Filled 2022-12-27 (×5): qty 0.4

## 2022-12-27 NOTE — Progress Notes (Signed)
   12/27/22 1738  Spiritual Encounters  Type of Visit Follow up  Care provided to: Patient  Conversation partners present during encounter Nurse  Referral source Patient request  Reason for visit Routine spiritual support  OnCall Visit No   Follow-up with patient, provided spiritual care, reflective listening and prayer.

## 2022-12-27 NOTE — Telephone Encounter (Signed)
Ignore, Marykate still had the email for SX CL , we found it and is now saved on my computer just incase

## 2022-12-27 NOTE — Progress Notes (Addendum)
2 Days Post-Op  Subjective: Patient is a 37 year old female who underwent bilateral mastectomies with Dr. Carolynne Edouard followed by immediate breast reconstruction with Dr. Ladona Ridgel on 12/23/2022. She was admitted to the floor for observation and pain control. Over the days following the surgery, patient's pain was unable to be controlled with medications. She subsequently underwent expander removal with Dr. Ladona Ridgel on 12/25/2022. She is postop day 2 from expander removal.   Today, patient states that she is still experiencing severe pain.  She states that she does find some relief from the pain medications, but once they wear off, she states the pain comes back "full force".  She states that she has not been able to rest for more than a few hours due to the pain.  She does report that she has been up and out of bed and walking.  She states she has been to the bathroom and voiding on her own.  She reports she has been eating and drinking without issue.  She reports she is a little bit nauseous at times, but denies any emesis.  Objective: Vital signs in last 24 hours: Temp:  [98.6 F (37 C)-99.1 F (37.3 C)] 98.6 F (37 C) (11/12 0502) Pulse Rate:  [63-83] 73 (11/12 0502) Resp:  [16-19] 17 (11/12 0502) BP: (114-148)/(73-99) 114/73 (11/12 0502) SpO2:  [95 %-98 %] 98 % (11/12 0502) Last BM Date : 12/23/22  Intake/Output from previous day: 11/11 0701 - 11/12 0700 In: -  Out: 170 [Drains:170] Intake/Output this shift: No intake/output data recorded.  General appearance: alert and cooperative Resp: Unlabored breathing, no respiratory distress Chest wall: Incisions are clean dry and intact.  There is no overlying erythema or ecchymosis to either breast.  No obvious sign of hematoma or seroma.  JP drains are in place bilaterally and have minimal serosanguineous drainage in each of the bulbs.  Lab Results:     Latest Ref Rng & Units 12/23/2022    8:07 AM 11/21/2022    3:30 AM 07/06/2022    1:56 PM  CBC   WBC 4.0 - 10.5 K/uL 8.0  8.0  8.3   Hemoglobin 12.0 - 15.0 g/dL 16.1  09.6  04.5   Hematocrit 36.0 - 46.0 % 33.5  33.5  33.9   Platelets 150 - 400 K/uL 234  256  287     BMET No results for input(s): "NA", "K", "CL", "CO2", "GLUCOSE", "BUN", "CREATININE", "CALCIUM" in the last 72 hours. PT/INR No results for input(s): "LABPROT", "INR" in the last 72 hours. ABG No results for input(s): "PHART", "HCO3" in the last 72 hours.  Invalid input(s): "PCO2", "PO2"  Studies/Results: No results found.  Anti-infectives: Anti-infectives (From admission, onward)    Start     Dose/Rate Route Frequency Ordered Stop   12/25/22 0945  ceFAZolin (ANCEF) IVPB 2g/100 mL premix        2 g 200 mL/hr over 30 Minutes Intravenous On call to O.R. 12/25/22 0936 12/25/22 1011   12/25/22 0942  ceFAZolin (ANCEF) 2-4 GM/100ML-% IVPB       Note to Pharmacy: Aquilla Hacker M: cabinet override      12/25/22 0942 12/25/22 1012   12/24/22 1000  amoxicillin-clavulanate (AUGMENTIN) 875-125 MG per tablet 1 tablet  Status:  Discontinued        1 tablet Oral Every 12 hours 12/23/22 1408 12/25/22 0919   12/23/22 1430  ceFAZolin (ANCEF) IVPB 1 g/50 mL premix  Status:  Discontinued        1 g  100 mL/hr over 30 Minutes Intravenous  Once 12/23/22 1404 12/25/22 0919   12/23/22 1130  ceFAZolin 1 g / gentamicin 80 mg in NS 500 mL surgical irrigation         Irrigation  Once 12/23/22 1119 12/23/22 1148   12/23/22 0745  ceFAZolin (ANCEF) IVPB 2g/100 mL premix        2 g 200 mL/hr over 30 Minutes Intravenous On call to O.R. 12/23/22 0730 12/23/22 1047       Assessment/Plan: s/p Procedure(s): REMOVAL OF BILATERAL BREAST TISSUE EXPANDERS Plan: -Patient postop day 2 from expander removal, still experiencing uncontrolled pain.  Placed consult to palliative medicine to help with pain control.  Appreciate their recommendations. -Encourage patient to continue to make sure she is getting up and walking and eating and drinking as  she has been.  Patient expressed understanding.  ADDEND: I spoke with Dr. Ladona Ridgel this afternoon. He believes a psychiatric consult would benefit the patient given history of anxiety, depression and PTSD in the setting of the this ongoing pain. Consult has been placed.    LOS: 3 days    Laurena Spies, PA-C 12/27/2022

## 2022-12-27 NOTE — Telephone Encounter (Signed)
SX Clearence came in for pt from Terrence Dupont, NP, I can not find the sx clearance folder in teams to save my life lol,

## 2022-12-27 NOTE — Plan of Care (Signed)
Problem: Education: Goal: Knowledge of General Education information will improve Description: Including pain rating scale, medication(s)/side effects and non-pharmacologic comfort measures Outcome: Progressing Pt was admitted into the hospital for bloody nipple discharge of the right breast as well as milky like discharge from both breasts. She reports significant breast pain bilaterally.  Pt does have a strong family history of breast cancer in her mother, maternal grandmother, and maternal grandfather. D/t pain and her familial history she strongly desires to have bilateral mastectomies.  Informed her that the continuing POC is helping her manage her pain so that it could be tolerable.  Pt continues to endorsed c/o 7-9/10 bilateral chest/ breast pain.  Her goal for pain management has not been met.  She feel frustrated that her pain has not been manageable.  Palliative has been consulted and Ladona Ridgel, MD have been to the bedside to address pt's pain and POC as to managing her pain.  This Evening at 1715, Ladona Ridgel, MD came to the bedside along with this Care RN to speak with pt about pain management goals and POC.  The plan is to now pt to have a dilaudid PCA for her pain management.   Problem: Clinical Measurements: Goal: Will remain free from infection Outcome: Progressing S/Sx of infection monitored and assessed q-shift.  Pt has remained afebrile thus far.       Problem: Clinical Measurements: Goal: Respiratory complications will improve Outcome: Progressing Respiratory status monitored and assessed q-shift.  Pt is on room air with PO2 at 98-99% and respiration rate of 17-18 breaths per minute.  Pt has not endorsed c/o SOB or DOE.    Problem: Clinical Measurements: Goal: Cardiovascular complication will be avoided Outcome: Progressing Pt's VS WNL thus far.    Problem: Activity: Goal: Risk for activity intolerance will decrease Outcome: Progressing Pt is codependent of all her ADLs.  She  cannot get up OOB without the assistance of RN staff.  Pt is x1 assist OOB.  Today she did get OOB to go to the bathroom and afterwards sat in the recliner this shift. .      Problem: Nutrition: Goal: Adequate nutrition will be maintained Outcome: Progressing Pt is regular diet per MD's orders.  She can tolerate her diet w/o s/sx of abdominal pain/ distention or n/v.    Problem: Safety: Goal: Ability to remain free from injury will improve Outcome: Progressing Pt has remained free from falls thus far.  Instructed pt to utilize RN call light for assistance.  Hourly rounds performed.  Bed in lowest position, locked with two upper side rails engaged.  Belongings and call light within reach.    Problem: Skin Integrity: Goal: Risk for impaired skin integrity will decrease Outcome: Progressing Skin integrity monitored and assessed q-shift.  Instructed pt to turn q2 hours to prevent further skin impairment.  Tubes and drains assessed for device related pressures sores.  Pt is continent of both bowel and bladder.  Dressing changes performed per MD's orders.    Problem: Pain Management: Goal: General experience of comfort will improve Outcome: Not Progressing Pt has endorsed c/o 7-9/10 bilateral breast pain r/t her bilateral mastectomies describing it as a constant sharp, pressure that is cramping, contraction, jabbing, sore, ache.  She was found to be in sobbing pain uncontrollably. Reiterated pain scale so she could adequately rate her pain.  Pt stated her pain goal this admission would be 0/10.  Discussed nonpharmacological methods to help reduce s/sx of pain.  Interventions given per pt's request and MD's  orders.  Informed her that the continuing POC is helping her manage her pain so that it could be tolerable.  Pt continues to endorsed c/o 7-9/10 bilateral chest/ breast pain.  Her goal for pain management has not been met.  She feel frustrated that her pain has not been manageable.  Palliative has  been consulted and Ladona Ridgel, MD have been to the bedside to address pt's pain and POC as to managing her pain.  This Evening at 1715, Ladona Ridgel, MD came to the bedside along with this Care RN to speak with pt about pain management goals and POC.  The plan is to now pt to have a dilaudid PCA for her pain management.

## 2022-12-27 NOTE — Progress Notes (Signed)
Palliative-   Thank you for this consult.  Chart reviewed and discussed with Palliative team.  Patient appears to have post operative pain after elective bilateral mastectomy. Pathology is negative for cancer.  Unfortunately, managing post operative pain that is not in the setting of a chronic life limiting illness is outside the scope of our team.   We will sign off for now- please reach out with any questions or concerns.   Ocie Bob, AGNP-C Palliative Medicine  No charge

## 2022-12-27 NOTE — Plan of Care (Signed)

## 2022-12-27 NOTE — Progress Notes (Signed)
2 Days Post-Op   Subjective/Chief Complaint: Still complains of severe pain despite tissue expanders being removed   Objective: Vital signs in last 24 hours: Temp:  [98.6 F (37 C)-99.1 F (37.3 C)] 98.6 F (37 C) (11/12 0502) Pulse Rate:  [63-83] 73 (11/12 0502) Resp:  [16-19] 17 (11/12 0502) BP: (114-148)/(73-99) 114/73 (11/12 0502) SpO2:  [95 %-98 %] 98 % (11/12 0502) Last BM Date : 12/23/22  Intake/Output from previous day: 11/11 0701 - 11/12 0700 In: -  Out: 170 [Drains:170] Intake/Output this shift: No intake/output data recorded.  General appearance: alert and cooperative Resp: clear to auscultation bilaterally Chest wall: skin flaps look good Cardio: regular rate and rhythm GI: soft, non-tender; bowel sounds normal; no masses,  no organomegaly  Lab Results:  No results for input(s): "WBC", "HGB", "HCT", "PLT" in the last 72 hours. BMET No results for input(s): "NA", "K", "CL", "CO2", "GLUCOSE", "BUN", "CREATININE", "CALCIUM" in the last 72 hours. PT/INR No results for input(s): "LABPROT", "INR" in the last 72 hours. ABG No results for input(s): "PHART", "HCO3" in the last 72 hours.  Invalid input(s): "PCO2", "PO2"  Studies/Results: No results found.  Anti-infectives: Anti-infectives (From admission, onward)    Start     Dose/Rate Route Frequency Ordered Stop   12/25/22 0945  ceFAZolin (ANCEF) IVPB 2g/100 mL premix        2 g 200 mL/hr over 30 Minutes Intravenous On call to O.R. 12/25/22 0936 12/25/22 1011   12/25/22 0942  ceFAZolin (ANCEF) 2-4 GM/100ML-% IVPB       Note to Pharmacy: Aquilla Hacker M: cabinet override      12/25/22 0942 12/25/22 1012   12/24/22 1000  amoxicillin-clavulanate (AUGMENTIN) 875-125 MG per tablet 1 tablet  Status:  Discontinued        1 tablet Oral Every 12 hours 12/23/22 1408 12/25/22 0919   12/23/22 1430  ceFAZolin (ANCEF) IVPB 1 g/50 mL premix  Status:  Discontinued        1 g 100 mL/hr over 30 Minutes Intravenous  Once  12/23/22 1404 12/25/22 0919   12/23/22 1130  ceFAZolin 1 g / gentamicin 80 mg in NS 500 mL surgical irrigation         Irrigation  Once 12/23/22 1119 12/23/22 1148   12/23/22 0745  ceFAZolin (ANCEF) IVPB 2g/100 mL premix        2 g 200 mL/hr over 30 Minutes Intravenous On call to O.R. 12/23/22 0730 12/23/22 1047       Assessment/Plan: s/p Procedure(s): REMOVAL OF BILATERAL BREAST TISSUE EXPANDERS (Bilateral) Advance diet Continue to work on pain control  LOS: 3 days    Chevis Pretty III 12/27/2022

## 2022-12-27 NOTE — Progress Notes (Signed)
Postop day 4 from mastectomies and placement of tissue expanders, postop day 2 from removal of tissue expanders.  Patient still complains of severe pain she states that the pain is no longer controlled with the current regimen of oxycodone and Dilaudid for breakthrough pain.  She is requesting a PCA.  She is taking p.o. without nausea however her p.o. intake of food has been limited.  She has not had a bowel movement since Saturday.  On physical examination the incisions are clean dry and intact there is no erythema there is no evidence of fluid collection.  Assessment: Status post mastectomies.  Patient continues to have severe pain without any clear etiology.  Will order a PCA.  Patient may shower and is encouraged to do so.  I have requested a psychiatric evaluation as the patient has a history of PTSD.  I do not know if this is contributing to her ongoing pain issues but I do think that it needs to be ruled out.  Continue hospitalization until pain can be controlled.

## 2022-12-28 DIAGNOSIS — N6452 Nipple discharge: Secondary | ICD-10-CM | POA: Diagnosis not present

## 2022-12-28 MED ORDER — HYDROXYZINE HCL 25 MG PO TABS
25.0000 mg | ORAL_TABLET | Freq: Three times a day (TID) | ORAL | Status: AC
  Administered 2022-12-28 – 2022-12-31 (×9): 25 mg via ORAL
  Filled 2022-12-28 (×9): qty 1

## 2022-12-28 MED ORDER — HYDROXYZINE HCL 25 MG PO TABS
25.0000 mg | ORAL_TABLET | Freq: Three times a day (TID) | ORAL | Status: DC | PRN
  Administered 2022-12-31 – 2023-01-01 (×2): 25 mg via ORAL
  Filled 2022-12-28 (×2): qty 1
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
## 2022-12-28 NOTE — Plan of Care (Signed)
Problem: Education: Goal: Knowledge of General Education information will improve Description: Including pain rating scale, medication(s)/side effects and non-pharmacologic comfort measures Outcome: Progressing Pt was admitted into the hospital for bloody nipple discharge of the right breast as well as milky like discharge from both breasts. She reports significant breast pain bilaterally.  Pt does have a strong family history of breast cancer in her mother, maternal grandmother, and maternal grandfather. D/t pain and her familial history she strongly desires to have bilateral mastectomies.  Informed her that the continuing POC is helping her manage her pain so that it could be tolerable.  Pt continues to endorsed c/o 7-8/10 bilateral chest/ breast pain.  Her goal for pain management has not been met.  She feel frustrated that her pain has not been manageable.  Palliative has been consulted and Ladona Ridgel, MD have been to the bedside to address pt's pain and POC as to managing her pain.  The plan is for pt to have a dilaudid PCA for her pain management.  Today Psych came by to assess pt's psyche and pain.  See their     Problem: Clinical Measurements: Goal: Will remain free from infection Outcome: Progressing S/Sx of infection monitored and assessed q-shift.  Pt has remained afebrile thus far.       Problem: Clinical Measurements: Goal: Respiratory complications will improve Outcome: Progressing Respiratory status monitored and assessed q-shift.  Pt is on room air with PO2 at 98-99% and respiration rate of 17-18 breaths per minute.  Pt has not endorsed c/o SOB or DOE.    Problem: Clinical Measurements: Goal: Cardiovascular complication will be avoided Outcome: Progressing Pt's VS WNL thus far.    Problem: Activity: Goal: Risk for activity intolerance will decrease Outcome: Progressing Pt is codependent of all her ADLs.  She cannot get up OOB without the assistance of RN staff.  Pt is x1 assist  OOB.  Today she did get OOB to go to the bathroom and afterwards sat in the recliner this shift. .      Problem: Nutrition: Goal: Adequate nutrition will be maintained Outcome: Progressing Pt is regular diet per MD's orders.  She can tolerate her diet w/o s/sx of abdominal pain/ distention or n/v.    Problem: Safety: Goal: Ability to remain free from injury will improve Outcome: Progressing Pt has remained free from falls thus far.  Instructed pt to utilize RN call light for assistance.  Hourly rounds performed.  Bed in lowest position, locked with two upper side rails engaged.  Belongings and call light within reach.    Problem: Skin Integrity: Goal: Risk for impaired skin integrity will decrease Outcome: Progressing Skin integrity monitored and assessed q-shift.  Instructed pt to turn q2 hours to prevent further skin impairment.  Tubes and drains assessed for device related pressures sores.  Pt is continent of both bowel and bladder.  Dressing changes performed per MD's orders.    Problem: Pain Management: Goal: General experience of comfort will improve Outcome: Not Progressing Pt has endorsed c/o 7-8/10 bilateral breast pain r/t her bilateral mastectomies describing it as a constant sharp, throbbing, discomforting, ache.  She was found to be in sobbing pain uncontrollably. Reiterated pain scale so she could adequately rate her pain.  Pt stated her pain goal this admission would be 0/10.  Discussed nonpharmacological methods to help reduce s/sx of pain.  Interventions given per pt's request and MD's orders.  Informed her that the continuing POC is helping her manage her pain so that  it could be tolerable.  Pt continues to endorsed c/o 7-8/10 bilateral chest/ breast pain.  Her goal for pain management has not been met.  She feel frustrated that her pain has not been manageable.  Palliative has been consulted and Ladona Ridgel, MD have been to the bedside to address pt's pain and POC as to managing her  pain.  The plan is for pt to have a dilaudid PCA for her pain management.  Today Psych came by to assess pt's psyche and pain.  See their note.

## 2022-12-28 NOTE — Progress Notes (Signed)
3 Days Post-Op  Subjective: Patient is a 37 year old female who underwent bilateral mastectomies with Dr. Carolynne Edouard followed by immediate breast reconstruction with Dr. Ladona Ridgel on 12/23/2022. She was admitted to the floor for observation and pain control. Over the days following the surgery, patient's pain was unable to be controlled with medications. She subsequently underwent expander removal with Dr. Ladona Ridgel on 12/25/2022. She is postop day 3 from expander removal.  Per chart review, Dilaudid PCA was started for patient last night.  Today, upon entering the room patient is lying comfortably in bed.  She states that she is feeling much better and the pain is better controlled.  She states that she was able to get some sleep last night for several hours.  She reports she is eating and drinking without issue.  She states she has been up and going to the bathroom.  She states she has not yet had a bowel movement.  She did state that she had some MiraLAX this morning.  Objective: Vital signs in last 24 hours: Temp:  [98.2 F (36.8 C)-98.9 F (37.2 C)] 98.9 F (37.2 C) (11/13 0432) Pulse Rate:  [64-88] 64 (11/13 0432) Resp:  [16-19] 17 (11/13 0432) BP: (115-154)/(74-95) 129/79 (11/13 0432) SpO2:  [94 %-100 %] 98 % (11/13 0432) Last BM Date : 12/26/22  Intake/Output from previous day: 11/12 0701 - 11/13 0700 In: 270 [P.O.:240; I.V.:30] Out: 50 [Drains:50] Intake/Output this shift: Total I/O In: 240 [P.O.:240] Out: -   General appearance: alert, cooperative, and no distress Resp: Unlabored breathing, no respiratory distress, nasal cannula in place  Chest wall: Incisions are clean dry and intact, no sign of hematoma or seroma on exam.  JP drains in place and are functioning, with serosanguineous drainage in each of the bulbs.   Lab Results:     Latest Ref Rng & Units 12/23/2022    8:07 AM 11/21/2022    3:30 AM 07/06/2022    1:56 PM  CBC  WBC 4.0 - 10.5 K/uL 8.0  8.0  8.3   Hemoglobin 12.0 -  15.0 g/dL 62.9  52.8  41.3   Hematocrit 36.0 - 46.0 % 33.5  33.5  33.9   Platelets 150 - 400 K/uL 234  256  287     BMET No results for input(s): "NA", "K", "CL", "CO2", "GLUCOSE", "BUN", "CREATININE", "CALCIUM" in the last 72 hours. PT/INR No results for input(s): "LABPROT", "INR" in the last 72 hours. ABG No results for input(s): "PHART", "HCO3" in the last 72 hours.  Invalid input(s): "PCO2", "PO2"  Studies/Results: No results found.  Anti-infectives: Anti-infectives (From admission, onward)    Start     Dose/Rate Route Frequency Ordered Stop   12/25/22 0945  ceFAZolin (ANCEF) IVPB 2g/100 mL premix        2 g 200 mL/hr over 30 Minutes Intravenous On call to O.R. 12/25/22 0936 12/25/22 1011   12/25/22 0942  ceFAZolin (ANCEF) 2-4 GM/100ML-% IVPB       Note to Pharmacy: Aquilla Hacker M: cabinet override      12/25/22 0942 12/25/22 1012   12/24/22 1000  amoxicillin-clavulanate (AUGMENTIN) 875-125 MG per tablet 1 tablet  Status:  Discontinued        1 tablet Oral Every 12 hours 12/23/22 1408 12/25/22 0919   12/23/22 1430  ceFAZolin (ANCEF) IVPB 1 g/50 mL premix  Status:  Discontinued        1 g 100 mL/hr over 30 Minutes Intravenous  Once 12/23/22 1404 12/25/22 0919  12/23/22 1130  ceFAZolin 1 g / gentamicin 80 mg in NS 500 mL surgical irrigation         Irrigation  Once 12/23/22 1119 12/23/22 1148   12/23/22 0745  ceFAZolin (ANCEF) IVPB 2g/100 mL premix        2 g 200 mL/hr over 30 Minutes Intravenous On call to O.R. 12/23/22 0730 12/23/22 1047       Assessment/Plan: s/p Procedure(s): REMOVAL OF BILATERAL BREAST TISSUE EXPANDERS Plan: -Patient's pain better managed on dilaudid PCA. Discussed with her now that her pain is starting to better controlled, we will next work on weaning her off IV medications and transition to oral. Will see how she is doing tomorrow and consider transition to oral meds at that time.  -Psych eval pending -Encourage to continue to get up and  ambulate and eat and drink -Bowel regimen: miralax, if no BM by tomorrow will increase bowel regimen     LOS: 4 days    Laurena Spies, PA-C 12/28/2022

## 2022-12-28 NOTE — Progress Notes (Signed)
   12/28/22 1415  Spiritual Encounters  Type of Visit Follow up  Care provided to: Patient;Friend  Referral source Patient request  Reason for visit Routine spiritual support  OnCall Visit No   Follow-up visit, provided spiritual care

## 2022-12-28 NOTE — Progress Notes (Signed)
Leah Shea is now postop day 5 from her mastectomies and postop day 3 for removal of her tissue expanders.  She states that she is beginning to do better and feels that her pain is better controlled with the PCA.  She has been up to a chair all day today.  She is tolerating a diet.  She was seen by psychiatry today who recommended and started Atarax.  Recommendations for continuing patient's Lexapro was noted.  Discussed ongoing care with Leah Shea this evening.  At this point she is requesting that we can maintain the PCA tomorrow with transition to oral medications Friday morning and anticipated discharge home on Saturday.  I have discussed physical activity with her.  She has no restrictions on movement of her arms.  She has no restriction on use of her arms other than no heavy lifting greater than 20 pounds.  She may shower as tolerated with no concerns regarding the drains.  I have encouraged her to ambulate as much as possible.  We have also discussed diet and I have asked her to focus on protein.  Continue hospitalization for pain management with anticipated discharge on Saturday morning.

## 2022-12-29 ENCOUNTER — Inpatient Hospital Stay (HOSPITAL_COMMUNITY): Payer: No Typology Code available for payment source

## 2022-12-29 ENCOUNTER — Encounter: Payer: No Typology Code available for payment source | Admitting: Plastic Surgery

## 2022-12-29 MED ORDER — DOCUSATE SODIUM 100 MG PO CAPS
100.0000 mg | ORAL_CAPSULE | Freq: Every day | ORAL | Status: DC | PRN
Start: 2022-12-29 — End: 2023-01-01
  Administered 2022-12-29 – 2022-12-31 (×2): 100 mg via ORAL
  Filled 2022-12-29 (×2): qty 1

## 2022-12-29 MED ORDER — QUETIAPINE FUMARATE 25 MG PO TABS
25.0000 mg | ORAL_TABLET | Freq: Every day | ORAL | Status: DC
  Administered 2022-12-29: 25 mg via ORAL
  Filled 2022-12-29: qty 1

## 2022-12-29 NOTE — Progress Notes (Addendum)
4 Days Post-Op   Subjective/Chief Complaint: Still complains of pain. Fell last night against side of bed. Complains of left hip pain   Objective: Vital signs in last 24 hours: Temp:  [97.6 F (36.4 C)-99 F (37.2 C)] 97.8 F (36.6 C) (11/14 0740) Pulse Rate:  [59-81] 63 (11/14 0740) Resp:  [12-20] 12 (11/14 0742) BP: (125-154)/(79-105) 154/94 (11/14 0740) SpO2:  [94 %-100 %] 100 % (11/14 0742) FiO2 (%):  [0 %] 0 % (11/14 0742) Last BM Date : 12/26/22  Intake/Output from previous day: 11/13 0701 - 11/14 0700 In: 1920 [P.O.:1920] Out: 130 [Drains:130] Intake/Output this shift: No intake/output data recorded.  General appearance: alert and cooperative Resp: clear to auscultation bilaterally Chest wall: skin flaps look good. Drains intact Cardio: regular rate and rhythm GI: soft, non-tender; bowel sounds normal; no masses,  no organomegaly  Lab Results:  No results for input(s): "WBC", "HGB", "HCT", "PLT" in the last 72 hours. BMET No results for input(s): "NA", "K", "CL", "CO2", "GLUCOSE", "BUN", "CREATININE", "CALCIUM" in the last 72 hours. PT/INR No results for input(s): "LABPROT", "INR" in the last 72 hours. ABG No results for input(s): "PHART", "HCO3" in the last 72 hours.  Invalid input(s): "PCO2", "PO2"  Studies/Results: No results found.  Anti-infectives: Anti-infectives (From admission, onward)    Start     Dose/Rate Route Frequency Ordered Stop   12/25/22 0945  ceFAZolin (ANCEF) IVPB 2g/100 mL premix        2 g 200 mL/hr over 30 Minutes Intravenous On call to O.R. 12/25/22 0936 12/25/22 1011   12/25/22 0942  ceFAZolin (ANCEF) 2-4 GM/100ML-% IVPB       Note to Pharmacy: Crissie Sickles: cabinet override      12/25/22 0942 12/25/22 1012   12/24/22 1000  amoxicillin-clavulanate (AUGMENTIN) 875-125 MG per tablet 1 tablet  Status:  Discontinued        1 tablet Oral Every 12 hours 12/23/22 1408 12/25/22 0919   12/23/22 1430  ceFAZolin (ANCEF) IVPB 1 g/50 mL  premix  Status:  Discontinued        1 g 100 mL/hr over 30 Minutes Intravenous  Once 12/23/22 1404 12/25/22 0919   12/23/22 1130  ceFAZolin 1 g / gentamicin 80 mg in NS 500 mL surgical irrigation         Irrigation  Once 12/23/22 1119 12/23/22 1148   12/23/22 0745  ceFAZolin (ANCEF) IVPB 2g/100 mL premix        2 g 200 mL/hr over 30 Minutes Intravenous On call to O.R. 12/23/22 0730 12/23/22 1047       Assessment/Plan: s/p Procedure(s): REMOVAL OF BILATERAL BREAST TISSUE EXPANDERS (Bilateral) Advance diet Xray left hip Ambulate Work on pain control May need palliative consult for pain control  LOS: 5 days    Leah Shea 12/29/2022

## 2022-12-29 NOTE — Progress Notes (Signed)
4 Days Post-Op  Subjective: Patient is a 37 year old female who underwent bilateral mastectomies with Dr. Carolynne Edouard followed by immediate breast reconstruction with Dr. Ladona Ridgel on 12/23/2022. She was admitted to the floor for observation and pain control. Over the days following the surgery, patient's pain was unable to be controlled with medications. She subsequently underwent expander removal with Dr. Ladona Ridgel on 12/25/2022. She is postop day 4 from expander removal.    Upon entering the room this morning, patient is tearful in bed.  She states that she fell earlier this morning on her left hip and also hit her left drain site.  She denies hitting her head.  She denies any loss of consciousness.  She denies any headaches.  She is alert and oriented.  She states she was up and going to the bathroom and tripped and fell onto the bed.  She reports she is still eating and drinking.  She also reports she is still getting up and walking.  States her pain at the surgical site has been improving, but she does report a little bit of pain to the left posterior chest wall after her fall.  Per RN, patient is still having difficulty sleeping at night and having signs of her PTSD.  RN also states that patient has not yet had a bowel movement.   Objective: Vital signs in last 24 hours: Temp:  [97.6 F (36.4 C)-99 F (37.2 C)] 97.8 F (36.6 C) (11/14 0740) Pulse Rate:  [59-81] 63 (11/14 0740) Resp:  [12-20] 12 (11/14 0742) BP: (125-154)/(79-105) 154/94 (11/14 0740) SpO2:  [94 %-100 %] 100 % (11/14 0742) FiO2 (%):  [0 %] 0 % (11/13 1627) Last BM Date : 12/26/22  Intake/Output from previous day: 11/13 0701 - 11/14 0700 In: 1920 [P.O.:1920] Out: 130 [Drains:130] Intake/Output this shift: Total I/O In: 240 [P.O.:240] Out: -   General appearance: alert and cooperative Resp: Unlabored breathing, no respiratory distress Chest wall: Surgical site incisions appear to be clean dry intact.  There are no signs of  hematoma or seroma.  No signs of infection.  Left JP drain is in place and functioning properly.  There is some serosanguineous drainage in the bulb.  Right JP drain tubing was not connected to bulb.  This was reconnected and drain was charged.  It was functioning properly after it was reconnected.  There was some serosanguineous drainage in the bulb.  Left posterior chest wall examined.  No overt signs of trauma.  No overlying bruising or swelling.  Very mild tenderness to palpation. Extremities: Left lower extremity: Left hip with no overt signs of trauma, no bruising, no swelling.  Very mild tenderness to palpation.  Patient is able to wiggle her toes.  Sensation intact distally.  Pedal pulse palpated.  5 out of 5 strength on plantarflexion/dorsiflexion.  Lab Results:     Latest Ref Rng & Units 12/23/2022    8:07 AM 11/21/2022    3:30 AM 07/06/2022    1:56 PM  CBC  WBC 4.0 - 10.5 K/uL 8.0  8.0  8.3   Hemoglobin 12.0 - 15.0 g/dL 40.9  81.1  91.4   Hematocrit 36.0 - 46.0 % 33.5  33.5  33.9   Platelets 150 - 400 K/uL 234  256  287     BMET No results for input(s): "NA", "K", "CL", "CO2", "GLUCOSE", "BUN", "CREATININE", "CALCIUM" in the last 72 hours. PT/INR No results for input(s): "LABPROT", "INR" in the last 72 hours. ABG No results for input(s): "PHART", "  HCO3" in the last 72 hours.  Invalid input(s): "PCO2", "PO2"  Studies/Results: No results found.  Anti-infectives: Anti-infectives (From admission, onward)    Start     Dose/Rate Route Frequency Ordered Stop   12/25/22 0945  ceFAZolin (ANCEF) IVPB 2g/100 mL premix        2 g 200 mL/hr over 30 Minutes Intravenous On call to O.R. 12/25/22 0936 12/25/22 1011   12/25/22 0942  ceFAZolin (ANCEF) 2-4 GM/100ML-% IVPB       Note to Pharmacy: Crissie Sickles: cabinet override      12/25/22 0942 12/25/22 1012   12/24/22 1000  amoxicillin-clavulanate (AUGMENTIN) 875-125 MG per tablet 1 tablet  Status:  Discontinued        1 tablet Oral  Every 12 hours 12/23/22 1408 12/25/22 0919   12/23/22 1430  ceFAZolin (ANCEF) IVPB 1 g/50 mL premix  Status:  Discontinued        1 g 100 mL/hr over 30 Minutes Intravenous  Once 12/23/22 1404 12/25/22 0919   12/23/22 1130  ceFAZolin 1 g / gentamicin 80 mg in NS 500 mL surgical irrigation         Irrigation  Once 12/23/22 1119 12/23/22 1148   12/23/22 0745  ceFAZolin (ANCEF) IVPB 2g/100 mL premix        2 g 200 mL/hr over 30 Minutes Intravenous On call to O.R. 12/23/22 0730 12/23/22 1047       Assessment/Plan: s/p Procedure(s): REMOVAL OF BILATERAL BREAST TISSUE EXPANDERS Plan: -Patient postop day 4 from expander removal.  Still experiencing pain, will continue on PCA and consider transition to oral pain meds tomorrow.  Did discuss this with the patient.  Did attempt to consult palliative care 2 days ago, but they are unable to treat the patient as she does not have any terminal diseases or prognosis. -Did reach out to psych in regards to patient's difficulty sleeping.  They recommended Seroquel 25 mg at bedtime.  This has been ordered.  Appreciate recommendations. -Left hip x-ray has been ordered for the patient by general surgery.  Given posterior chest wall pain, chest x-ray has also been ordered.  Will follow-up on those results. -Colace has been added to patient's bowel regimen as she has not yet had a bowel movement. -Continue to keep drains charged at all times. -Possible discharge Saturday if patient can tolerate oral medications tomorrow and if x-rays come back negative.  LOS: 5 days    Laurena Spies, PA-C 12/29/2022

## 2022-12-29 NOTE — Progress Notes (Signed)
PT Cancellation Note  Patient Details Name: Leah Shea MRN: [id number] DOB: 1985/07/01   Cancelled Treatment:    Reason Eval/Treat Not Completed: Patient declined, no reason specified;Pain limiting ability to participate (will follow up at later date/time as schedule allows and pt able.)   Renaldo Fiddler PT, DPT Acute Rehabilitation Services Office [phone]  12/29/22 3:52 PM
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
## 2022-12-29 NOTE — Progress Notes (Signed)
Ms. Ixta is now postop day 6 from her mastectomies and postop day 4 from removal of her tissue expanders.  Events of overnight noted and discussed earlier today with Ms. Georga Kaufmann.  Patient fell while getting out of bed to go to the bathroom.  She states that she felt dizzy and lost her balance.  She did not strike her head.  She did strike her hip and is still complaining of some tingling in her hip.  She has been up and ambulating since the event and has not had any difficulty with ambulation.  She states she is also having difficulty with sleep and night terrors.  Pain is somewhat controlled on the PCA.  She is still motivated to transition to oral pain medications tomorrow.  On examination her chest is without significant findings.  The incisions are clean dry and intact.  There is no additional swelling at the surgical sites.  The drains are intact and the output is a thin serosanguineous fluid as expected.  Patient has normal motor and sensation in both feet.  X-ray and hip x-ray are unremarkable with no evidence of bony abnormality  Status post mastectomies with placement of tissue expanders and subsequent removal of tissue expanders due to severe pain.  Agree with Dr. Carolynne Edouard that a evaluation by palliative care for acute pain management or better a consult from the acute pain service would be appropriate.  Unfortunately palliative care cannot provide any recommendations as the patient does not meet their criteria and there is no acute pain service available.  Patient will start on Seroquel tonight for her sleep disturbance.  She would like to continue with the plan to transition to oxycodone tomorrow with Dilaudid backup.  Will plan to start her out on 5 to 15 mg of oxycodone every 4 hours as needed based on level of pain with 1 mg of Dilaudid every 2 hours as needed for severe breakthrough pain.  Will evaluate for discharge on Saturday morning but I suspect that she will require an  additional day before she is able to be discharged.

## 2022-12-29 NOTE — Progress Notes (Signed)
   12/29/22 1438  Mobility  Activity Ambulated with assistance to bathroom  Level of Assistance Minimal assist, patient does 75% or more  Assistive Device None  Distance Ambulated (ft) 10 ft  Activity Response Tolerated fair  Mobility Referral Yes  $Mobility charge 1 Mobility  Mobility Specialist Start Time (ACUTE ONLY) 1400  Mobility Specialist Stop Time (ACUTE ONLY) 1438  Mobility Specialist Time Calculation (min) (ACUTE ONLY) 38 min   Mobility Specialist: Progress Note  Pt agreeable to mobility session - received in bed. Required MinA with no AD, pt with knee buckling x1 d/t pain. C/o chest pain more on L side rated 9/10 . Returned to bed with all needs met - call bell within reach. RN and Visitor present  Pt stated she had fall outside the bathroom around 5 am this morning.   Barnie Mort, BS Mobility Specialist Please contact via SecureChat or Rehab office at [phone].

## 2022-12-29 NOTE — Plan of Care (Signed)
Problem: Education: Goal: Knowledge of General Education information will improve Description: Including pain rating scale, medication(s)/side effects and non-pharmacologic comfort measures Outcome: Progressing Pt was admitted into the hospital for bloody nipple discharge of the right breast as well as milky like discharge from both breasts. She reports significant breast pain bilaterally.  Pt does have a strong family history of breast cancer in her mother, maternal grandmother, and maternal grandfather. D/t pain and her familial history she strongly desires to have bilateral mastectomies.  Informed her that the continuing POC is helping her manage her pain so that it could be tolerable.  Pt continues to endorsed c/o 7-8/10 bilateral chest/ breast pain.  Her goal for pain management has not been met.  She feel frustrated that her pain has not been manageable.  Palliative has been consulted and Leah Ridgel, Leah Shea have been to the bedside to address pt's pain and POC as to managing her pain.  The plan is to continue a dilaudid PCA for pt's pain management.  Yesterday Psych came by to assess pt's psyche and pain.  See their Note.  This Am pt fell while trying to get up OOB to the bathroom.  She stated she trips and fell hitting her left side of her chest, back and legs.  Leah Shea was made aware.  A CXR and hip X-ray was ordered.  Pt states that her leg pain is worse compared to her bilateral chest pain.  Although bilateral chest pain is getting better Pt has endorsed that she cannot sleep at night d/t her PTSD.  She was found and observed to be sleeping with a butter knife with her in the bed.  Conveyed the following findings to the Surgical team and Psych.  Upon assessment Psych made recommendations to start pt on 25mg  of PO Seroquel at bedtime for her insomnia. Surgical team ordered per Psych recommendations.      Problem: Clinical Measurements: Goal: Will remain free from infection Outcome: Progressing S/Sx of  infection monitored and assessed q-shift.  Pt has remained afebrile thus far.       Problem: Clinical Measurements: Goal: Respiratory complications will improve Outcome: Progressing Respiratory status monitored and assessed q-shift.  Pt is on room air with PO2 at 985-100% and respiration rate of 11-18 breaths per minute.  Pt has not endorsed c/o SOB or DOE.    Problem: Clinical Measurements: Goal: Cardiovascular complication will be avoided Outcome: Progressing Pt's VS WNL thus far.    Problem: Activity: Goal: Risk for activity intolerance will decrease Outcome: Progressing Pt is codependent of all her ADLs.  She cannot get up OOB without the assistance of RN staff.  Pt is x1 assist OOB.  At 0530 pt fell while ambulating OOB to the bathroom on prior shift.  D/t her fall she has been endorsing more left back/ leg pain than bilateral breast/ chest pain.  For this reason she has not been OOB much and sat in the recliner.  Continued to encourage pt in mobilizing to reduce HAPI and HAP.       Problem: Nutrition: Goal: Adequate nutrition will be maintained Outcome: Progressing Pt is regular diet per Leah Shea's orders.  She can tolerate her diet w/o s/sx of abdominal pain/ distention or n/v.    Problem: Safety: Goal: Ability to remain free from injury will improve Outcome: Progressing Pt fell while ambulating OOB to the bathroom on prior shift.  Continue to  instruct pt to utilize RN call light for assistance.  Hourly rounds performed.  Bed  alarm implemented to keep pt safe from falls.  Settings activated to third most sensitive mode.  Bed in lowest position, locked with two upper side rails engaged.  Belongings and call light within reach.  Pt has nonskid socks on when OOB.  Door left open when family is not in the room to effective monitor her.    Problem: Skin Integrity: Goal: Risk for impaired skin integrity will decrease Outcome: Progressing Skin integrity monitored and assessed q-shift.   Instructed pt to turn q2 hours to prevent further skin impairment.  Tubes and drains assessed for device related pressures sores.  Pt is continent of both bowel and bladder.  Dressing changes performed per Leah Shea's orders.    Problem: Pain Management: Goal: General experience of comfort will improve Outcome: Not Progressing Pt has endorsed c/o 7-8/10 bilateral breast pain r/t her bilateral mastectomies describing it as a constant sharp, throbbing, discomforting, ache.  She was found to be in sobbing pain uncontrollably. Reiterated pain scale so she could adequately rate her pain.  Pt stated her pain goal this admission would be 0/10.  Discussed nonpharmacological methods to help reduce s/sx of pain.  Interventions given per pt's request and Leah Shea's orders.   Informed her that the continuing POC is helping her manage her pain so that it could be tolerable.  Pt continues to endorsed c/o 7-8/10 bilateral chest/ breast pain.  Her goal for pain management has not been met.  She feel frustrated that her pain has not been manageable.  Palliative has been consulted and Leah Ridgel, Leah Shea have been to the bedside to address pt's pain and POC as to managing her pain.  The plan is to continue a dilaudid PCA for pt's pain management.  Yesterday Psych came by to assess pt's psyche and pain.  See their Note.  This Am pt fell while trying to get up OOB to the bathroom.  She stated she trips and fell hitting her left side of her chest, back and legs.  Leah Shea was made aware.  A CXR and hip X-ray was ordered.  Pt states that her leg pain is worse compared to her bilateral chest pain.  Although bilateral chest pain is getting better Pt has endorsed that she cannot sleep at night d/t her PTSD.  She was found and observed to be sleeping with a butter knife with her in the bed.  Conveyed the following findings to the Surgical team and Psych.  Upon assessment Psych made recommendations to start pt on 25mg  of PO Seroquel at bedtime for her insomnia.  Surgical team ordered per Psych recommendations.

## 2022-12-30 DIAGNOSIS — F4310 Post-traumatic stress disorder, unspecified: Secondary | ICD-10-CM

## 2022-12-30 DIAGNOSIS — R440 Auditory hallucinations: Secondary | ICD-10-CM | POA: Diagnosis not present

## 2022-12-30 DIAGNOSIS — F418 Other specified anxiety disorders: Secondary | ICD-10-CM | POA: Diagnosis not present

## 2022-12-30 DIAGNOSIS — F515 Nightmare disorder: Secondary | ICD-10-CM | POA: Diagnosis not present

## 2022-12-30 DIAGNOSIS — F4312 Post-traumatic stress disorder, chronic: Secondary | ICD-10-CM

## 2022-12-30 MED ORDER — QUETIAPINE FUMARATE 25 MG PO TABS
25.0000 mg | ORAL_TABLET | Freq: Once | ORAL | Status: AC
  Administered 2022-12-30: 25 mg via ORAL
  Filled 2022-12-30: qty 1

## 2022-12-30 MED ORDER — ACETAMINOPHEN 325 MG PO TABS
650.0000 mg | ORAL_TABLET | Freq: Four times a day (QID) | ORAL | Status: DC | PRN
  Filled 2022-12-30 (×2): qty 2

## 2022-12-30 MED ORDER — ESCITALOPRAM OXALATE 10 MG PO TABS
5.0000 mg | ORAL_TABLET | Freq: Every day | ORAL | Status: AC
  Administered 2022-12-30: 5 mg via ORAL
  Filled 2022-12-30: qty 1

## 2022-12-30 MED ORDER — PRAZOSIN HCL 1 MG PO CAPS
1.0000 mg | ORAL_CAPSULE | Freq: Every day | ORAL | Status: DC
  Administered 2022-12-30 – 2023-01-01 (×2): 1 mg via ORAL
  Filled 2022-12-30 (×3): qty 1

## 2022-12-30 MED ORDER — HYDROMORPHONE HCL 1 MG/ML IJ SOLN
1.0000 mg | INTRAMUSCULAR | Status: DC | PRN
  Administered 2022-12-30 – 2022-12-31 (×5): 1 mg via INTRAVENOUS
  Filled 2022-12-30 (×6): qty 1

## 2022-12-30 MED ORDER — DIPHENHYDRAMINE HCL 25 MG PO CAPS
25.0000 mg | ORAL_CAPSULE | Freq: Four times a day (QID) | ORAL | Status: DC | PRN
  Administered 2022-12-30 (×2): 25 mg via ORAL
  Filled 2022-12-30 (×2): qty 1

## 2022-12-30 MED ORDER — QUETIAPINE FUMARATE 25 MG PO TABS
25.0000 mg | ORAL_TABLET | Freq: Two times a day (BID) | ORAL | Status: DC
  Administered 2022-12-31 – 2023-01-01 (×4): 25 mg via ORAL
  Filled 2022-12-30 (×4): qty 1

## 2022-12-30 MED ORDER — OXYCODONE-ACETAMINOPHEN 7.5-325 MG PO TABS
2.0000 | ORAL_TABLET | ORAL | Status: DC | PRN
  Administered 2022-12-30 – 2023-01-01 (×10): 2 via ORAL
  Filled 2022-12-30 (×10): qty 2

## 2022-12-30 MED ORDER — OXYCODONE-ACETAMINOPHEN 5-325 MG PO TABS
1.0000 | ORAL_TABLET | ORAL | Status: DC | PRN
  Administered 2022-12-30: 2 via ORAL
  Filled 2022-12-30: qty 2

## 2022-12-30 MED ORDER — ESCITALOPRAM OXALATE 10 MG PO TABS
10.0000 mg | ORAL_TABLET | Freq: Every day | ORAL | Status: DC
  Administered 2022-12-31 – 2023-01-01 (×2): 10 mg via ORAL
  Filled 2022-12-30 (×2): qty 1

## 2022-12-30 MED ORDER — QUETIAPINE FUMARATE 25 MG PO TABS
100.0000 mg | ORAL_TABLET | Freq: Every day | ORAL | Status: DC
  Administered 2022-12-30 – 2022-12-31 (×2): 100 mg via ORAL
  Filled 2022-12-30 (×2): qty 4
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
## 2022-12-30 NOTE — Progress Notes (Signed)
Waste 22 mg/22ml of dilaudid PCA with Maisie Fus, Charity fundraiser as witness. Called pharmacy, no where to put in [facility]

## 2022-12-30 NOTE — Progress Notes (Signed)
5 Days Post-Op   Subjective/Chief Complaint: Seems to be more comfortable today on oral pain meds   Objective: Vital signs in last 24 hours: Temp:  [97.9 F (36.6 C)-99 F (37.2 C)] 98.8 F (37.1 C) (11/15 1613) Pulse Rate:  [70-83] 72 (11/15 1613) Resp:  [9-18] 16 (11/15 1613) BP: (109-135)/(74-88) 128/78 (11/15 1613) SpO2:  [95 %-100 %] 96 % (11/15 1613) Last BM Date : 12/26/22  Intake/Output from previous day: 11/14 0701 - 11/15 0700 In: 240 [P.O.:240] Out: 85 [Drains:85] Intake/Output this shift: No intake/output data recorded.  General appearance: alert and cooperative Resp: clear to auscultation bilaterally Chest wall: skin flaps look good Cardio: regular rate and rhythm GI: soft, non-tender; bowel sounds normal; no masses,  no organomegaly  Lab Results:  No results for input(s): "WBC", "HGB", "HCT", "PLT" in the last 72 hours. BMET No results for input(s): "NA", "K", "CL", "CO2", "GLUCOSE", "BUN", "CREATININE", "CALCIUM" in the last 72 hours. PT/INR No results for input(s): "LABPROT", "INR" in the last 72 hours. ABG No results for input(s): "PHART", "HCO3" in the last 72 hours.  Invalid input(s): "PCO2", "PO2"  Studies/Results: DG Chest Port 1 View  Result Date: 12/29/2022 CLINICAL DATA:  Fall.  Nipple discharge. EXAM: PORTABLE CHEST 1 VIEW COMPARISON:  Two-view chest x-ray 11/21/2022. FINDINGS: The heart size is normal. Lung volumes are low. Mild bibasilar atelectasis is present. The visualized soft tissues and bony thorax are otherwise unremarkable. IMPRESSION: Low lung volumes and mild bibasilar atelectasis. Electronically Signed   By: Marin Roberts M.D.   On: 12/29/2022 12:43   DG HIP PORT UNILAT WITH PELVIS 1V LEFT  Result Date: 12/29/2022 CLINICAL DATA:  Fall today.  Left hip pain and tingling. EXAM: DG HIP (WITH OR WITHOUT PELVIS) 3V PORT LEFT COMPARISON:  None Available. FINDINGS: There is no evidence of hip fracture or dislocation. There is no  evidence of arthropathy or other focal bone abnormality. IMPRESSION: Negative left hip radiographs. Electronically Signed   By: Marin Roberts M.D.   On: 12/29/2022 12:40    Anti-infectives: Anti-infectives (From admission, onward)    Start     Dose/Rate Route Frequency Ordered Stop   12/25/22 0945  ceFAZolin (ANCEF) IVPB 2g/100 mL premix        2 g 200 mL/hr over 30 Minutes Intravenous On call to O.R. 12/25/22 0936 12/25/22 1011   12/25/22 0942  ceFAZolin (ANCEF) 2-4 GM/100ML-% IVPB       Note to Pharmacy: Aquilla Hacker M: cabinet override      12/25/22 0942 12/25/22 1012   12/24/22 1000  amoxicillin-clavulanate (AUGMENTIN) 875-125 MG per tablet 1 tablet  Status:  Discontinued        1 tablet Oral Every 12 hours 12/23/22 1408 12/25/22 0919   12/23/22 1430  ceFAZolin (ANCEF) IVPB 1 g/50 mL premix  Status:  Discontinued        1 g 100 mL/hr over 30 Minutes Intravenous  Once 12/23/22 1404 12/25/22 0919   12/23/22 1130  ceFAZolin 1 g / gentamicin 80 mg in NS 500 mL surgical irrigation         Irrigation  Once 12/23/22 1119 12/23/22 1148   12/23/22 0745  ceFAZolin (ANCEF) IVPB 2g/100 mL premix        2 g 200 mL/hr over 30 Minutes Intravenous On call to O.R. 12/23/22 0730 12/23/22 1047       Assessment/Plan: s/p Procedure(s): REMOVAL OF BILATERAL BREAST TISSUE EXPANDERS (Bilateral) Advance diet Ambulate Pain control Hopefully home over the  weekend or we will have to plan for snf  LOS: 6 days    Leah Shea 12/30/2022

## 2022-12-30 NOTE — Progress Notes (Addendum)
   12/30/22 1017  Mobility  Activity Ambulated with assistance to bathroom  Level of Assistance Contact guard assist, steadying assist  Assistive Device Other (Comment) (IV Pole)  Distance Ambulated (ft) 10 ft  Activity Response Tolerated fair  Mobility Referral Yes  $Mobility charge 1 Mobility  Mobility Specialist Start Time (ACUTE ONLY) [phone]  Mobility Specialist Stop Time (ACUTE ONLY) 1017  Mobility Specialist Time Calculation (min) (ACUTE ONLY) 24 min   Mobility Specialist: Progress Note  Pt agreeable to mobility session - received standing in room - passed from PT session. Required CG using IV Pole. Pt with slow gait. C/o chest pain. Returned to bed with all needs met - call bell within reach.   Barnie Mort, BS Mobility Specialist Please contact via SecureChat or Rehab office at [phone].

## 2022-12-30 NOTE — Progress Notes (Addendum)
5 Days Post-Op  Subjective: Patient is a 37 year old female who underwent bilateral mastectomies with Dr. Carolynne Edouard followed by immediate breast reconstruction with Dr. Ladona Ridgel on 12/23/2022. She was admitted to the floor for observation and pain control. Over the days following the surgery, patient's pain was unable to be controlled with medications. She subsequently underwent expander removal with Dr. Ladona Ridgel on 12/25/2022. She is postop day 5 from expander removal.   Upon initially entering the room this morning, patient was sitting at the side of bed working with PT.  She states that she was still having some pain. Patient stated she "feels like she is coming and going". We did discuss transitioning to oral medications today to see if she would tolerate.  Patient reported that she had been still ambulating.  She stated that she had been eating and drinking without any issue.    I did see patient later in the morning as nursing notified me that patient had coughed something firm up and patient was concerned about it.  Patient denied swallowing anything.  She denies any other associated symptoms.  She reports that she is scared as she coughed up firm white material.  She became tearful.  Patient then stated that she was "scared of Dr. Ladona Ridgel".  When I asked the patient why she was scared of Dr. Ladona Ridgel, she could not give me an answer and stated she did not know why.  Upon further discussing with the patient, she stated that she felt like she was "in trouble with Dr. Ladona Ridgel".  She stated that she felt like she was a child being scolded by parent.  She denied any safety concerns with Dr. Ladona Ridgel.  She stated that she still wanted him to see her and to be on her team.  Objective: Vital signs in last 24 hours: Temp:  [97.9 F (36.6 C)-99 F (37.2 C)] 99 F (37.2 C) (11/15 0739) Pulse Rate:  [64-83] 83 (11/15 0739) Resp:  [9-18] 16 (11/15 0739) BP: (109-136)/(74-88) 109/76 (11/15 0739) SpO2:  [95 %-100 %] 95 %  (11/15 0739) Last BM Date : 12/26/22  Intake/Output from previous day: 11/14 0701 - 11/15 0700 In: 240 [P.O.:240] Out: 85 [Drains:85] Intake/Output this shift: No intake/output data recorded.  General appearance: alert and cooperative, tearful  Resp: Unlabored breathing, no respiratory distress Chest wall: Incisions are clean dry and intact.  No sign of hematoma or seroma.  JP drains are in place and functioning bilaterally with some serosanguineous drainage in the bulbs.  Lab Results:     Latest Ref Rng & Units 12/23/2022    8:07 AM 11/21/2022    3:30 AM 07/06/2022    1:56 PM  CBC  WBC 4.0 - 10.5 K/uL 8.0  8.0  8.3   Hemoglobin 12.0 - 15.0 g/dL 08.6  57.8  46.9   Hematocrit 36.0 - 46.0 % 33.5  33.5  33.9   Platelets 150 - 400 K/uL 234  256  287     BMET No results for input(s): "NA", "K", "CL", "CO2", "GLUCOSE", "BUN", "CREATININE", "CALCIUM" in the last 72 hours. PT/INR No results for input(s): "LABPROT", "INR" in the last 72 hours. ABG No results for input(s): "PHART", "HCO3" in the last 72 hours.  Invalid input(s): "PCO2", "PO2"  Studies/Results: DG Chest Port 1 View  Result Date: 12/29/2022 CLINICAL DATA:  Fall.  Nipple discharge. EXAM: PORTABLE CHEST 1 VIEW COMPARISON:  Two-view chest x-ray 11/21/2022. FINDINGS: The heart size is normal. Lung volumes are low. Mild bibasilar atelectasis  is present. The visualized soft tissues and bony thorax are otherwise unremarkable. IMPRESSION: Low lung volumes and mild bibasilar atelectasis. Electronically Signed   By: Marin Roberts M.D.   On: 12/29/2022 12:43   DG HIP PORT UNILAT WITH PELVIS 1V LEFT  Result Date: 12/29/2022 CLINICAL DATA:  Fall today.  Left hip pain and tingling. EXAM: DG HIP (WITH OR WITHOUT PELVIS) 3V PORT LEFT COMPARISON:  None Available. FINDINGS: There is no evidence of hip fracture or dislocation. There is no evidence of arthropathy or other focal bone abnormality. IMPRESSION: Negative left hip  radiographs. Electronically Signed   By: Marin Roberts M.D.   On: 12/29/2022 12:40    Anti-infectives: Anti-infectives (From admission, onward)    Start     Dose/Rate Route Frequency Ordered Stop   12/25/22 0945  ceFAZolin (ANCEF) IVPB 2g/100 mL premix        2 g 200 mL/hr over 30 Minutes Intravenous On call to O.R. 12/25/22 0936 12/25/22 1011   12/25/22 0942  ceFAZolin (ANCEF) 2-4 GM/100ML-% IVPB       Note to Pharmacy: Aquilla Hacker M: cabinet override      12/25/22 0942 12/25/22 1012   12/24/22 1000  amoxicillin-clavulanate (AUGMENTIN) 875-125 MG per tablet 1 tablet  Status:  Discontinued        1 tablet Oral Every 12 hours 12/23/22 1408 12/25/22 0919   12/23/22 1430  ceFAZolin (ANCEF) IVPB 1 g/50 mL premix  Status:  Discontinued        1 g 100 mL/hr over 30 Minutes Intravenous  Once 12/23/22 1404 12/25/22 0919   12/23/22 1130  ceFAZolin 1 g / gentamicin 80 mg in NS 500 mL surgical irrigation         Irrigation  Once 12/23/22 1119 12/23/22 1148   12/23/22 0745  ceFAZolin (ANCEF) IVPB 2g/100 mL premix        2 g 200 mL/hr over 30 Minutes Intravenous On call to O.R. 12/23/22 0730 12/23/22 1047       Assessment/Plan: s/p Procedure(s): REMOVAL OF BILATERAL BREAST TISSUE EXPANDERS Plan: -Respiratory: Will order chest x-ray for the morning given the material the patient coughed up.  It appears to be consistent with pulmonary cast.  Picture placed in the chart.  Will also get CBC in the morning as well.  Encourage continued use of incentive spirometry. -Chest Wall: Surgical sites healing well after surgery.  Continue to keep JP drains charged bilaterally.  May place ABDs over incision sites for comfort. -Pain Control: PCA discontinued after physical therapy.  Will plan to transition to orals if patient's pain can be controlled. -Bowel Regimen: MiraLAX, Colace -Psych: Concerned about worsening symptoms of PTSD and psych related issues.  Will reconsult psych.  I did discuss with  the patient that I was going to recommend psych see her again and she was in agreement with this. -Dispo: If patient can tolerate pain on oral medications today and tomorrow with minimal use of as needed Dilaudid, can consider discharge this weekend.  Patient seems motivated to go home on Sunday.  I had a long discussion with the patient about her concerns about being scared with Dr. Ladona Ridgel.  I did tell her that I had to discuss this with Dr. Ladona Ridgel as this was the second time she mentioned this. I told her that her care may be transition to the general surgery service.  I did discuss with Dr. Ladona Ridgel and he states that he will still plan on seeing the patient.  I did convey this to the patient.  She expressed understanding.  I had a provider with me in the room during the entirety of seeing the patient today.  *OVER THE WEEKEND, PLEASE CALL OUR OFFICE FOR OUR ON-CALL PROVIDER [phone] FOR ANY ACUTE CONCERNS. PLEASE DO NOT SECURE CHAT OUR SERVICE WITH ACUTE ISSUES*  LOS: 6 days    Laurena Spies, PA-C 12/30/2022

## 2022-12-30 NOTE — Progress Notes (Addendum)
Ms. Gombos is now postop day 7 from her bilateral mastectomies with placement of tissue expanders and postoperative day 5 from removal of her tissue expanders.  I was in to see Ms. Jeane this morning at approximately 9 AM.  At that time she was being seen by physical therapy to assist with range of motion in the arms and with mobility training and reconditioning.  Ms. Hochman stated that she is still having discomfort but was reluctantly agreeable to continuing with our plan to discontinue the PCA and transition back to oral pain medication in an attempt to make her ready for discharge.  We discussed at that time the importance of being out of bed and ambulating as her chest x-ray and her personal report confirmed that she is developing atelectasis and that she needs more physical activity.  I discussed with her the risk of developing pneumonia and the risk of DVT with continued immobility in the hospital.  I discontinued her PCA and started her on the maximum level of oral pain medication that she has taken in the hospital.  Shortly thereafter we were notified that the patient had a coughing episode and coughed up safe foreign material.  The material was inspected by Mr. Scheeler and Ms. Elenteny.  The material was felt to be a pulmonary cast and was sent to pathology for routine examination.  I requested that they order a CBC and chest x-ray for tomorrow morning to follow her care.  Shortly after they returned to the office I was notified that the patient has stated that she is now afraid of me.  At no point in our interactions has she ever indicated this in any way.  She has noted on several occasions that she feels that I am the best physician that she has had and that she is able to speak to me more openly than any other physician that she has interacted with.  I am unsure where this new fear is coming from.  We both share a history of service in the [employer].  The patient has a past history in the [employer]  that has contributed to her PTSD.  I am not sure if this is the source of the new concern.  I will notify Dr. Carolynne Edouard that he should take the lead in her care.  I will discuss this with Dr. Ulice Bold for her advice on whether risk-management should also be involved.

## 2022-12-30 NOTE — Evaluation (Signed)
Physical Therapy Evaluation Patient Details Name: Leah Shea MRN: [id number] DOB: February 09, 1986 Today's Date: 12/30/2022  History of Present Illness  Patient is 37 y.o. female admitted 11/8 for bilateral mastectomies with placement of tissue expander with Dr. Carolynne Edouard. Hospital course complicated by uncontrolled pain, pt now s/p expander removal with Dr. Ladona Ridgel on 12/25/2022. PMH includes: anxiety, PTSD, asthma, HTN, seizures, sleep apnea, and pt reports sickle cell anemia.   Clinical Impression  Pt in bed upon arrival of PT, agreeable to evaluation at this time. Prior to admission the pt was completely independent with all mobility, living at home with her spouse and 4 children. The pt was able to complete bed mobility without assistance, but benefits from minA to rise to standing at EOB due to pain in L hip. The pt was able to complete ~200 ft hallway ambulation, but needed intermittent standing rest breaks and UE support from rail or IV pole due to pain. She had no overt buckling, but does ambulate with significantly slowed speed. Pt encouraged to perform UE ROM paired with deep breathing and use IS, currently able to pull ~1278ml. Will continue to benefit from acute PT to progress functional strength, mobility, ROM, and endurance as well as OPPT to progress mobility and strength post-mastectomy. Pt will be safe to return home with family support when pain managed.   Gait Speed: 0.27m/s (pt ambulated 5 meters in and 30 seconds, gait speed <0.15m/s indicates increased risk of falls and dependence in ADLs)     If plan is discharge home, recommend the following: A little help with walking and/or transfers;Assist for transportation;Help with stairs or ramp for entrance;Assistance with cooking/housework   Can travel by private vehicle        Equipment Recommendations BSC/3in1  Recommendations for Other Services       Functional Status Assessment Patient has had a recent decline in  their functional status and demonstrates the ability to make significant improvements in function in a reasonable and predictable amount of time.     Precautions / Restrictions Precautions Precautions: Fall Precaution Comments: fall during admission, PCA pump Restrictions Weight Bearing Restrictions: Yes Other Position/Activity Restrictions: no ROM restrictions per MD, no lifting over 20lb      Mobility  Bed Mobility Overal bed mobility: Modified Independent             General bed mobility comments: HOB elevated, no assistance    Transfers Overall transfer level: Needs assistance Equipment used: 1 person hand held assist Transfers: Sit to/from Stand Sit to Stand: Min assist           General transfer comment: minA through [facility]    Ambulation/Gait Ambulation/Gait assistance: Contact guard assist Gait Distance (Feet): 200 Feet Assistive device: None, 1 person hand held assist Gait Pattern/deviations: Step-through pattern, Decreased stride length Gait velocity: 0.60m/s (5 meters in 1 min 30 sec) Gait velocity interpretation: <1.31 ft/sec, indicative of household ambulator   General Gait Details: very slow, guarded gait with minimal stride length. increased ankle strategies with each step. no overt buckling although pt reports L hip sore.  Stairs            Wheelchair Mobility     Tilt Bed    Modified Rankin (Stroke Patients Only)       Balance Overall balance assessment: Mild deficits observed, not formally tested  Pertinent Vitals/Pain Pain Assessment Pain Assessment: Faces Pain Score: 10-Worst pain ever Faces Pain Scale: Hurts even more Pain Location: chest, incision, L hip Pain Descriptors / Indicators: Discomfort, Grimacing, Guarding, Sore Pain Intervention(s): Limited activity within patient's tolerance, Monitored during session, Premedicated before session, Repositioned, PCA  encouraged, Relaxation, Utilized relaxation techniques, Ice applied    Home Living Family/patient expects to be discharged to:: Private residence Living Arrangements: Children;Spouse/significant other Available Help at Discharge: Family;Available 24 hours/day Type of Home: House Home Access: Stairs to enter Entrance Stairs-Rails: None Entrance Stairs-Number of Steps: 1   Home Layout: Multi-level;Able to live on main level with bedroom/bathroom Home Equipment: None      Prior Function Prior Level of Function : Independent/Modified Independent;Driving             Mobility Comments: independent ADLs Comments: independent, works from home     Extremity/Trunk Assessment   Upper Extremity Assessment Upper Extremity Assessment: RUE deficits/detail;LUE deficits/detail RUE Deficits / Details: limited shoulder flexion and abduction due to pain. able to reach to 90deg but limited past 90 by pain. pt cued to pair with breathing RUE: Unable to fully assess due to pain RUE Sensation: WNL RUE Coordination: WNL LUE Deficits / Details: limited shoulder flexion and abduction due to pain. able to reach to 90deg but limited past 90 by pain. pt cued to pair with breathing LUE: Unable to fully assess due to pain LUE Sensation: WNL LUE Coordination: WNL    Lower Extremity Assessment Lower Extremity Assessment: LLE deficits/detail LLE Deficits / Details: limited in ROM and MMT by pain, unable to maintain ankle DF against resistance, grossly WFL for mobility LLE: Unable to fully assess due to pain LLE Sensation: WNL LLE Coordination: WNL    Cervical / Trunk Assessment Cervical / Trunk Assessment: Normal;Other exceptions Cervical / Trunk Exceptions: double mastectomy  Communication   Communication Communication: No apparent difficulties Cueing Techniques: Verbal cues  Cognition Arousal: Alert Behavior During Therapy: WFL for tasks assessed/performed Overall Cognitive Status: Within  Functional Limits for tasks assessed                                 General Comments: pt with all movements slowed, guarded, fearful of movement and pain but agreeable to pushing through pain        General Comments General comments (skin integrity, edema, etc.): pt reports "rattles" in her lungs with breathing limited deep breath    Exercises General Exercises - Upper Extremity Shoulder Flexion: AROM, Both, 5 reps, Sidelying, Limitations Shoulder Flexion Limitations: to 90 deg, paired with breathing Shoulder ABduction: AROM, Both, 5 reps, Seated, Limitations Shoulder Abduction Limitations: to 90 deg, paired with breathing Other Exercises Other Exercises: seated deep breathing Other Exercises: IS use, pt only able to pull   Assessment/Plan    PT Assessment Patient needs continued PT services  PT Problem List Decreased strength;Decreased range of motion;Decreased activity tolerance;Decreased balance;Decreased mobility       PT Treatment Interventions Gait training;Stair training;Functional mobility training;Balance training    PT Goals (Current goals can be found in the Care Plan section)  Acute Rehab PT Goals Patient Stated Goal: return to independence, manage pain PT Goal Formulation: With patient Time For Goal Achievement: 01/13/23 Potential to Achieve Goals: Good    Frequency Min 1X/week     Co-evaluation               AM-PAC PT "6 Clicks" Mobility  Outcome Measure Help needed turning from your back to your side while in a flat bed without using bedrails?: None Help needed moving from lying on your back to sitting on the side of a flat bed without using bedrails?: None Help needed moving to and from a bed to a chair (including a wheelchair)?: A Little Help needed standing up from a chair using your arms (e.g., wheelchair or bedside chair)?: A Little Help needed to walk in hospital room?: A Little Help needed climbing 3-5 steps with a  railing? : A Little 6 Click Score: 20    End of Session Equipment Utilized During Treatment: Gait belt Activity Tolerance: Patient tolerated treatment well;Patient limited by pain Patient left: in chair;with call bell/phone within reach (with mobility specialist, in bathroom) Nurse Communication: Mobility status PT Visit Diagnosis: Unsteadiness on feet (R26.81);Pain Pain - part of body:  (chest)    Time: 6045-4098 PT Time Calculation (min) (ACUTE ONLY): 63 min   Charges:   PT Evaluation $PT Eval Moderate Complexity: 1 Mod PT Treatments $Gait Training: 8-22 mins $Therapeutic Exercise: 23-37 mins PT General Charges $$ ACUTE PT VISIT: 1 Visit         Vickki Muff, PT, DPT   Acute Rehabilitation Department Office [phone] Secure Chat Communication Preferred  Ronnie Derby 12/30/2022, 10:47 AM

## 2022-12-31 ENCOUNTER — Inpatient Hospital Stay (HOSPITAL_COMMUNITY): Payer: No Typology Code available for payment source

## 2022-12-31 LAB — CBC WITH DIFFERENTIAL/PLATELET
Abs Immature Granulocytes: 0.02 10*3/uL (ref 0.00–0.07)
Basophils Absolute: 0 10*3/uL (ref 0.0–0.1)
Basophils Relative: 1 %
Eosinophils Absolute: 0.4 10*3/uL (ref 0.0–0.5)
Eosinophils Relative: 8 %
HCT: 28.5 % — ABNORMAL LOW (ref 36.0–46.0)
Hemoglobin: 9.3 g/dL — ABNORMAL LOW (ref 12.0–15.0)
Immature Granulocytes: 0 %
Lymphocytes Relative: 45 %
Lymphs Abs: 2.4 10*3/uL (ref 0.7–4.0)
MCH: 29 pg (ref 26.0–34.0)
MCHC: 32.6 g/dL (ref 30.0–36.0)
MCV: 88.8 fL (ref 80.0–100.0)
Monocytes Absolute: 0.5 10*3/uL (ref 0.1–1.0)
Monocytes Relative: 9 %
Neutro Abs: 2 10*3/uL (ref 1.7–7.7)
Neutrophils Relative %: 37 %
Platelets: 245 10*3/uL (ref 150–400)
RBC: 3.21 MIL/uL — ABNORMAL LOW (ref 3.87–5.11)
RDW: 13 % (ref 11.5–15.5)
WBC: 5.5 10*3/uL (ref 4.0–10.5)
nRBC: 0 % (ref 0.0–0.2)

## 2022-12-31 MED ORDER — HYDROMORPHONE HCL 1 MG/ML IJ SOLN
1.0000 mg | INTRAMUSCULAR | Status: DC | PRN
  Administered 2022-12-31 – 2023-01-01 (×4): 1 mg via INTRAVENOUS
  Filled 2022-12-31 (×5): qty 1

## 2022-12-31 NOTE — Plan of Care (Signed)
Patient ID: Leah Shea, female   DOB: 02-14-1986, 37 y.o.   MRN: [id number]  Problem: Education: Goal: Knowledge of General Education information will improve Description: Including pain rating scale, medication(s)/side effects and non-pharmacologic comfort measures 12/31/2022 1917 by Lidia Collum, RN Outcome: Progressing 12/31/2022 1654 by Lidia Collum, RN Outcome: Progressing   Problem: Health Behavior/Discharge Planning: Goal: Ability to manage health-related needs will improve Outcome: Progressing   Problem: Clinical Measurements: Goal: Ability to maintain clinical measurements within normal limits will improve Outcome: Progressing Goal: Will remain free from infection Outcome: Progressing Goal: Diagnostic test results will improve Outcome: Progressing Goal: Respiratory complications will improve Outcome: Progressing Goal: Cardiovascular complication will be avoided Outcome: Progressing   Problem: Activity: Goal: Risk for activity intolerance will decrease Outcome: Progressing   Problem: Nutrition: Goal: Adequate nutrition will be maintained Outcome: Progressing   Problem: Coping: Goal: Level of anxiety will decrease Outcome: Progressing   Problem: Elimination: Goal: Will not experience complications related to bowel motility Outcome: Progressing Goal: Will not experience complications related to urinary retention Outcome: Progressing   Problem: Pain Management: Goal: General experience of comfort will improve Outcome: Progressing   Problem: Safety: Goal: Ability to remain free from injury will improve Outcome: Progressing   Problem: Skin Integrity: Goal: Risk for impaired skin integrity will decrease Outcome: Progressing  Lidia Collum, RN
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
## 2022-12-31 NOTE — Progress Notes (Signed)
6 Days Post-Op   Subjective/Chief Complaint: Says pain is relatively well controlled on current pain regimen.  No BM since admission.  On miralax.  Doesn't want suppository.  Mobilizing with therapies.  Tolerating her diet.   Objective: Vital signs in last 24 hours: Temp:  [97.8 F (36.6 C)-98.8 F (37.1 C)] 97.8 F (36.6 C) (11/16 0612) Pulse Rate:  [68-79] 68 (11/16 0612) Resp:  [16-18] 18 (11/16 0612) BP: (99-130)/(64-83) 99/64 (11/16 0612) SpO2:  [96 %-99 %] 97 % (11/16 0612) Last BM Date : 12/26/22  Intake/Output from previous day: No intake/output data recorded. Intake/Output this shift: Total I/O In: -  Out: 60 [Drains:60]  General appearance: alert and cooperative Resp: effort nonlabored on RA Chest wall: skin flaps look good, B JP drains with minimal serosang output. GI: soft, non-tender; bowel sounds normal  Lab Results:  Recent Labs    12/31/22 0406  WBC 5.5  HGB 9.3*  HCT 28.5*  PLT 245   BMET No results for input(s): "NA", "K", "CL", "CO2", "GLUCOSE", "BUN", "CREATININE", "CALCIUM" in the last 72 hours. PT/INR No results for input(s): "LABPROT", "INR" in the last 72 hours. ABG No results for input(s): "PHART", "HCO3" in the last 72 hours.  Invalid input(s): "PCO2", "PO2"  Studies/Results: DG Chest Port 1 View  Result Date: 12/29/2022 CLINICAL DATA:  Fall.  Nipple discharge. EXAM: PORTABLE CHEST 1 VIEW COMPARISON:  Two-view chest x-ray 11/21/2022. FINDINGS: The heart size is normal. Lung volumes are low. Mild bibasilar atelectasis is present. The visualized soft tissues and bony thorax are otherwise unremarkable. IMPRESSION: Low lung volumes and mild bibasilar atelectasis. Electronically Signed   By: Marin Roberts M.D.   On: 12/29/2022 12:43   DG HIP PORT UNILAT WITH PELVIS 1V LEFT  Result Date: 12/29/2022 CLINICAL DATA:  Fall today.  Left hip pain and tingling. EXAM: DG HIP (WITH OR WITHOUT PELVIS) 3V PORT LEFT COMPARISON:  None Available.  FINDINGS: There is no evidence of hip fracture or dislocation. There is no evidence of arthropathy or other focal bone abnormality. IMPRESSION: Negative left hip radiographs. Electronically Signed   By: Marin Roberts M.D.   On: 12/29/2022 12:40    Anti-infectives: Anti-infectives (From admission, onward)    Start     Dose/Rate Route Frequency Ordered Stop   12/25/22 0945  ceFAZolin (ANCEF) IVPB 2g/100 mL premix        2 g 200 mL/hr over 30 Minutes Intravenous On call to O.R. 12/25/22 0936 12/25/22 1011   12/25/22 0942  ceFAZolin (ANCEF) 2-4 GM/100ML-% IVPB       Note to Pharmacy: Aquilla Hacker M: cabinet override      12/25/22 0942 12/25/22 1012   12/24/22 1000  amoxicillin-clavulanate (AUGMENTIN) 875-125 MG per tablet 1 tablet  Status:  Discontinued        1 tablet Oral Every 12 hours 12/23/22 1408 12/25/22 0919   12/23/22 1430  ceFAZolin (ANCEF) IVPB 1 g/50 mL premix  Status:  Discontinued        1 g 100 mL/hr over 30 Minutes Intravenous  Once 12/23/22 1404 12/25/22 0919   12/23/22 1130  ceFAZolin 1 g / gentamicin 80 mg in NS 500 mL surgical irrigation         Irrigation  Once 12/23/22 1119 12/23/22 1148   12/23/22 0745  ceFAZolin (ANCEF) IVPB 2g/100 mL premix        2 g 200 mL/hr over 30 Minutes Intravenous On call to O.R. 12/23/22 0730 12/23/22 1047  Assessment/Plan: s/p Procedure(s): REMOVAL OF BILATERAL BREAST TISSUE EXPANDERS (Bilateral) Tolerating regular diet Ambulate Pain control Hopefully home tomorrow. Patient seen with Susy Frizzle, PA-C with plastics this morning.  LOS: 7 days    Leah Shea 12/31/2022

## 2022-12-31 NOTE — Progress Notes (Signed)
Patient ID: Leah Shea, female   DOB: [birth year], 37 y.o.   MRN: [id number]  Patient c/o pain with deep respirations. Rales heard upon auscultation. Congested cough . PA notified. States chest xray unremarkable. Will continue to monitor.   Lidia Collum, RN

## 2022-12-31 NOTE — Progress Notes (Signed)
6 Days Post-Op  Subjective: 37 year old female status post bilateral mastectomies with immediate breast reconstruction on 12/23/2022 followed by removal of bilateral breast tissue expanders due to pain on 12/25/2022.  She has been hospitalized since surgery for pain.  She was on Dilaudid PCA, this was stopped yesterday after physical therapy and she has been on p.o. pain medications with IV Dilaudid used for breakthrough pain.  She reports this morning she is tolerating p.o. medications with adequate pain control, but does report that the IV Dilaudid has been making the pain more tolerable.    She is evaluated today in conjunction with general surgery PA.  No acute overnight events.  She does report that she lost her voice overnight.  She reports that she has not had a BM yet, she has been on Colace and MiraLAX.  She becomes tearful on exam, she reiterates once again that she is not scared of Dr. Ladona Ridgel, but mentions that her statements about being scared of Dr. 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  She reports that she is hopeful that he is still on her team.  She does mention that she feels as if she has scratches on her upper back and asks to have this evaluated.  Upon evaluation no noticeable scratches or abrasions were noted.  She reports that at this moment she feels ready to go home tomorrow   Objective: Vital signs in last 24 hours: Temp:  [97.8 F (36.6 C)-98.8 F (37.1 C)] 97.8 F (36.6 C) (11/16 0612) Pulse Rate:  [68-79] 68 (11/16 0612) Resp:  [16-18] 18 (11/16 0612) BP: (99-130)/(64-83) 99/64 (11/16 0612) SpO2:  [96 %-99 %] 97 % (11/16 0612) Last BM Date : 12/26/22  Intake/Output from previous day: No intake/output data recorded. Intake/Output this shift: Total I/O In: -  Out: 60 [Drains:60]  Chaperone was present on exam, Barnetta Chapel, PA-C was present. General appearance: alert, cooperative, and resting in bed, tearful on exam Head:  Normocephalic, without obvious abnormality, atraumatic Resp: Unlabored Breasts: Status post bilateral mastectomy - bilateral mastectomy flaps are viable, bilateral breast incisions are intact, JP drains in place with serosanguineous drainage in bulb.  There is no subcutaneous fluid collection noted palpation.  There is no erythema or cellulitic changes noted. Extremities: extremities normal, atraumatic, no cyanosis or edema. Skin: No skin abrasions noted on upper back   Lab Results:     Latest Ref Rng & Units 12/31/2022    4:06 AM 12/23/2022    8:07 AM 11/21/2022    3:30 AM  CBC  WBC 4.0 - 10.5 K/uL 5.5  8.0  8.0   Hemoglobin 12.0 - 15.0 g/dL 9.3  33.2  95.1   Hematocrit 36.0 - 46.0 % 28.5  33.5  33.5   Platelets 150 - 400 K/uL 245  234  256     BMET No results for input(s): "NA", "K", "CL", "CO2", "GLUCOSE", "BUN", "CREATININE", "CALCIUM" in the last 72 hours. PT/INR No results for input(s): "LABPROT", "INR" in the last 72 hours. ABG No results for input(s): "PHART", "HCO3" in the last 72 hours.  Invalid input(s): "PCO2", "PO2"  Studies/Results: DG Chest Port 1 View  Result Date: 12/29/2022 CLINICAL DATA:  Fall.  Nipple discharge. EXAM: PORTABLE CHEST 1 VIEW COMPARISON:  Two-view chest x-ray 11/21/2022. FINDINGS: The heart size is normal. Lung volumes are low. Mild bibasilar atelectasis is present. The visualized soft tissues and bony thorax are otherwise unremarkable. IMPRESSION: Low lung volumes and mild bibasilar atelectasis. Electronically Signed  By: Marin Roberts M.D.   On: 12/29/2022 12:43   DG HIP PORT UNILAT WITH PELVIS 1V LEFT  Result Date: 12/29/2022 CLINICAL DATA:  Fall today.  Left hip pain and tingling. EXAM: DG HIP (WITH OR WITHOUT PELVIS) 3V PORT LEFT COMPARISON:  None Available. FINDINGS: There is no evidence of hip fracture or dislocation. There is no evidence of arthropathy or other focal bone abnormality. IMPRESSION: Negative left hip radiographs.  Electronically Signed   By: Marin Roberts M.D.   On: 12/29/2022 12:40    Anti-infectives: Anti-infectives (From admission, onward)    Start     Dose/Rate Route Frequency Ordered Stop   12/25/22 0945  ceFAZolin (ANCEF) IVPB 2g/100 mL premix        2 g 200 mL/hr over 30 Minutes Intravenous On call to O.R. 12/25/22 0936 12/25/22 1011   12/25/22 0942  ceFAZolin (ANCEF) 2-4 GM/100ML-% IVPB       Note to Pharmacy: Crissie Sickles: cabinet override      12/25/22 0942 12/25/22 1012   12/24/22 1000  amoxicillin-clavulanate (AUGMENTIN) 875-125 MG per tablet 1 tablet  Status:  Discontinued        1 tablet Oral Every 12 hours 12/23/22 1408 12/25/22 0919   12/23/22 1430  ceFAZolin (ANCEF) IVPB 1 g/50 mL premix  Status:  Discontinued        1 g 100 mL/hr over 30 Minutes Intravenous  Once 12/23/22 1404 12/25/22 0919   12/23/22 1130  ceFAZolin 1 g / gentamicin 80 mg in NS 500 mL surgical irrigation         Irrigation  Once 12/23/22 1119 12/23/22 1148   12/23/22 0745  ceFAZolin (ANCEF) IVPB 2g/100 mL premix        2 g 200 mL/hr over 30 Minutes Intravenous On call to O.R. 12/23/22 0730 12/23/22 1047       Assessment/Plan: s/p Procedure(s): Bilateral mastectomy with immediate breast reconstruction with subsequent REMOVAL OF BILATERAL BREAST TISSUE EXPANDERS  Plan:  Respiratory: Patient had x-ray this morning, will await reading.  No acute changes. Encouraged incentive spirometer.  WBC normal.   Status post bilateral mastectomy: breast incisions are healing well after surgery, continue to keep JP drain discharge bilaterally.  Overall appears to be healing well.  Pain control: Patient currently on scheduled Percocet, Robaxin, gabapentin, Tylenol.  Also on Dilaudid 1 mg PRN.  Dilaudid dosing yesterday was consistently approximately every 2 hours, last dose yesterday was 1800 hrs, first dose this a.m. at 748.  Will discuss with team need for decrease in dosing to prepare for possible discharge  tomorrow.  Bowel regimen: MiraLAX and Colace.  No BMs yet, patient refuses suppository at this time.  Psych: Psych is following, appreciate their consult.  DVT prophylaxis: Lovenox, SCDs and ambulation.  Disposition: Appreciate general surgery assisting with care, will reevaluate patient tomorrow for discharge planning.  Patient is agreeable for discharge tomorrow during today's encounter.    LOS: 7 days    Leslee Home, PA-C 12/31/2022

## 2022-12-31 NOTE — Progress Notes (Signed)
Physical Therapy Treatment Patient Details Name: Leah Shea MRN: [id number] DOB: 1985/09/19 Today's Date: 12/31/2022   History of Present Illness Patient is 37 y.o. female admitted 11/8 for bilateral mastectomies with placement of tissue expander with Dr. Carolynne Edouard. Hospital course complicated by uncontrolled pain, pt now s/p expander removal with Dr. Ladona Ridgel on 12/25/2022. PMH includes: anxiety, PTSD, asthma, HTN, seizures, sleep apnea, and pt reports sickle cell anemia.    PT Comments  Pt received sitting in the recliner and agreeable to session. Pt requires significantly increased time to complete mobility tasks due to increased pain, but is able to do so with up to CGA for safety. Pt instructed in HEP with pt demonstrating understanding. Pt able to tolerate gait trial demonstrating BLE instability and shakiness, but no overt buckling or LOB. Pt reports hugging pillow improves pain during mobility. Pt reports dizziness in standing that improves and then returns again requiring seated rest. Pt continues to benefit from PT services to progress toward functional mobility goals.     If plan is discharge home, recommend the following: A little help with walking and/or transfers;Assist for transportation;Help with stairs or ramp for entrance;Assistance with cooking/housework   Can travel by private vehicle        Equipment Recommendations  BSC/3in1    Recommendations for Other Services       Precautions / Restrictions Precautions Precautions: Fall Precaution Comments: fall during admission, PCA pump Restrictions Weight Bearing Restrictions: Yes Other Position/Activity Restrictions: no ROM restrictions per MD, no lifting over 20lb     Mobility  Bed Mobility               General bed mobility comments: Pt up in recliner upon arrival    Transfers Overall transfer level: Needs assistance Equipment used: None Transfers: Sit to/from Stand Sit to Stand: Contact guard assist            General transfer comment: increased time for power up and CGA for safety    Ambulation/Gait Ambulation/Gait assistance: Contact guard assist Gait Distance (Feet): 120 Feet Assistive device: None Gait Pattern/deviations: Step-through pattern, Decreased stride length, Trunk flexed     Pre-gait activities: marching General Gait Details: very slow and guarded gait with pt hugging pillow for comfort. BLE instability and weakness, but no overt LOB or buckling      Balance Overall balance assessment: Mild deficits observed, not formally tested                                          Cognition Arousal: Alert Behavior During Therapy: WFL for tasks assessed/performed Overall Cognitive Status: Within Functional Limits for tasks assessed                                          Exercises Other Exercises Other Exercises: post-mastectomy HEP    General Comments General comments (skin integrity, edema, etc.): Pt reports dizziness in standing that improves and then returns again at end of gait trial      Pertinent Vitals/Pain Pain Assessment Pain Assessment: 0-10 Pain Score: 9  Pain Location: chest, incision, Pain Descriptors / Indicators: Discomfort, Grimacing, Guarding, Sore Pain Intervention(s): Monitored during session, Repositioned, Limited activity within patient's tolerance     PT Goals (current goals can now be found in the care  plan section) Acute Rehab PT Goals Patient Stated Goal: return to independence, manage pain PT Goal Formulation: With patient Time For Goal Achievement: 01/13/23 Progress towards PT goals: Progressing toward goals    Frequency    Min 1X/week       AM-PAC PT "6 Clicks" Mobility   Outcome Measure  Help needed turning from your back to your side while in a flat bed without using bedrails?: None Help needed moving from lying on your back to sitting on the side of a flat bed without using  bedrails?: None Help needed moving to and from a bed to a chair (including a wheelchair)?: A Little Help needed standing up from a chair using your arms (e.g., wheelchair or bedside chair)?: A Little Help needed to walk in hospital room?: A Little Help needed climbing 3-5 steps with a railing? : A Little 6 Click Score: 20    End of Session   Activity Tolerance: Patient tolerated treatment well;Patient limited by pain Patient left: in chair;with call bell/phone within reach;with family/visitor present Nurse Communication: Mobility status PT Visit Diagnosis: Unsteadiness on feet (R26.81);Pain     Time: 1308-6578 PT Time Calculation (min) (ACUTE ONLY): 46 min  Charges:    $Gait Training: 23-37 mins $Therapeutic Exercise: 8-22 mins PT General Charges $$ ACUTE PT VISIT: 1 Visit                     Johny Shock, PTA Acute Rehabilitation Services Secure Chat Preferred  [phone]    Johny Shock 12/31/2022, 4:16 PM

## 2023-01-01 MED ORDER — METHOCARBAMOL 750 MG PO TABS: 750.0000 mg | ORAL_TABLET | Freq: Four times a day (QID) | ORAL | 0 refills | Status: AC | PRN

## 2023-01-01 MED ORDER — GABAPENTIN 100 MG PO CAPS: 100.0000 mg | ORAL_CAPSULE | Freq: Three times a day (TID) | ORAL | 0 refills | Status: DC

## 2023-01-01 MED ORDER — OXYCODONE HCL 5 MG PO CAPS: 5.0000 mg | ORAL_CAPSULE | Freq: Four times a day (QID) | ORAL | 0 refills | Status: AC | PRN

## 2023-01-01 NOTE — TOC Transition Note (Addendum)
Transition of Care [facility]) - CM/SW Discharge Note   Patient Details  Name: Leah Shea MRN: [id number] Date of Birth: 15-Oct-1985  [facility]) CM/SW Contact:  Lawerance Sabal, RN Phone Number: 01/01/2023, 9:28 AM   Clinical Narrative:     Per handoff 11/14 from Teena Irani RN CM DME has been ordered through the [place].  [facility] to [facility] delivery.   11:49 Nurse reached out and asked if DME could be ordered through [facility], [facility] about using [facility].  [employer] will deliver RW and 3/1 to the room.     Barriers to Discharge: Continued Medical Work up   Patient Goals and CMS Choice      Discharge Placement                         Discharge Plan and Services Additional resources added to the After Visit Summary for   In-house Referral: Clinical Social Work              DME Arranged: Arts development officer, Environmental consultant rolling DME Agency: Other - [facility] Date DME Agency Contacted: 12/29/22 Time DME Agency Contacted: [id number] Representative spoke with at DME Agency: emailed DME reqeust            Social Determinants of Health (SDOH) Interventions SDOH Screenings   Food Insecurity: No Food Insecurity (12/23/2022)  Housing: Low Risk  (12/23/2022)  Transportation Needs: No Transportation Needs (12/23/2022)  Utilities: Not At Risk (12/23/2022)  Depression (PHQ2-9): High Risk (09/22/2020)  Tobacco Use: Medium Risk (12/25/2022)     Readmission Risk Interventions     No data to display

## 2023-01-01 NOTE — Progress Notes (Signed)
7 Days Post-Op  Subjective: 37 year old female status post bilateral mastectomy and bilateral breast reconstruction with placement of tissue expanders with Dr. Carolynne Edouard and Dr. Ladona Ridgel on 12/23/2022.  She subsequently had the tissue expanders removed on 12/25/2022.  She has been hospitalized since surgery on 12/23/2022 for postoperative pain control.  Upon evaluation this a.m., she is accompanied by her daughter at bedside.  She reports she is ready to go home, feels as if her pain is well-controlled at this time.  She does report that she takes chronic oxycodone 10 mg daily at home.  She has some questions about recovery at home, drain care, dressing care.  She reports that she is seen at [facility] for chronic pain.   Objective: Vital signs in last 24 hours: Temp:  [98.5 F (36.9 C)-99.5 F (37.5 C)] 98.5 F (36.9 C) (11/17 0817) Pulse Rate:  [73-97] 74 (11/17 0817) Resp:  [16-18] 16 (11/17 0817) BP: (117-141)/(71-113) 117/71 (11/17 0817) SpO2:  [95 %-100 %] 95 % (11/17 0817) Last BM Date : 12/26/22  Intake/Output from previous day: 11/16 0701 - 11/17 0700 In: -  Out: 60 [Drains:60] Intake/Output this shift: No intake/output data recorded.  General appearance: alert, cooperative, no distress, and daughter and RN at bedside Head: Normocephalic, without obvious abnormality, atraumatic Resp: Unlabored Breasts: Status post bilateral mastectomy, bilateral NAC's are surgically absent.  Bilateral breast incisions are intact and appear to be healing very well.  There is no subcutaneous fluid collection noted palpation.  There is no erythema or cellulitic changes noted.  Bilateral JP drains in place.  Right drain has some dark sanguinous drainage, left drain is serosanguineous.  Lab Results:     Latest Ref Rng & Units 12/31/2022    4:06 AM 12/23/2022    8:07 AM 11/21/2022    3:30 AM  CBC  WBC 4.0 - 10.5 K/uL 5.5  8.0  8.0   Hemoglobin 12.0 - 15.0 g/dL 9.3  16.1  09.6    Hematocrit 36.0 - 46.0 % 28.5  33.5  33.5   Platelets 150 - 400 K/uL 245  234  256     BMET No results for input(s): "NA", "K", "CL", "CO2", "GLUCOSE", "BUN", "CREATININE", "CALCIUM" in the last 72 hours. PT/INR No results for input(s): "LABPROT", "INR" in the last 72 hours. ABG No results for input(s): "PHART", "HCO3" in the last 72 hours.  Invalid input(s): "PCO2", "PO2"  Studies/Results: DG CHEST PORT 1 VIEW  Result Date: 12/31/2022 CLINICAL DATA:  Abnormal sputum. EXAM: PORTABLE CHEST 1 VIEW COMPARISON:  View chest x-ray 12/29/2022 FINDINGS: The heart is mildly enlarged. Lung volumes remain low. Mild bibasilar atelectasis is present. The lungs are otherwise clear. The visualized soft tissues and bony thorax are unremarkable. Surgical drains remain in place. IMPRESSION: Low lung volumes and mild bibasilar atelectasis. Electronically Signed   By: Marin Roberts M.D.   On: 12/31/2022 12:15    Anti-infectives: Anti-infectives (From admission, onward)    Start     Dose/Rate Route Frequency Ordered Stop   12/25/22 0945  ceFAZolin (ANCEF) IVPB 2g/100 mL premix        2 g 200 mL/hr over 30 Minutes Intravenous On call to O.R. 12/25/22 0936 12/25/22 1011   12/25/22 0942  ceFAZolin (ANCEF) 2-4 GM/100ML-% IVPB       Note to Pharmacy: Crissie Sickles: cabinet override      12/25/22 0942 12/25/22 1012   12/24/22 1000  amoxicillin-clavulanate (AUGMENTIN) 875-125 MG per tablet 1 tablet  Status:  Discontinued        1 tablet Oral Every 12 hours 12/23/22 1408 12/25/22 0919   12/23/22 1430  ceFAZolin (ANCEF) IVPB 1 g/50 mL premix  Status:  Discontinued        1 g 100 mL/hr over 30 Minutes Intravenous  Once 12/23/22 1404 12/25/22 0919   12/23/22 1130  ceFAZolin 1 g / gentamicin 80 mg in NS 500 mL surgical irrigation         Irrigation  Once 12/23/22 1119 12/23/22 1148   12/23/22 0745  ceFAZolin (ANCEF) IVPB 2g/100 mL premix        2 g 200 mL/hr over 30 Minutes Intravenous On call to O.R.  12/23/22 0730 12/23/22 1047       Assessment/Plan: s/p Procedure(s): REMOVAL OF BILATERAL BREAST TISSUE EXPANDERS  Plan:   Respiratory: X-ray yesterday showing low lung volumes and mild bibasilar atelectasis.  Continue to encourage importance of incentive spirometer.  No signs of pneumonia.  She does report that she feels as if she is losing her voice, encouraged her to see primary care for evaluation if this does not improve.  Status post bilateral mastectomy: Incisions are healing well.  JP drains are in place with expected output at this point.  Discussed with patient recommendations for monitoring this outpatient and recording the drainage.   Pain control: Patient currently on scheduled Percocet, Robaxin, gabapentin, Tylenol.  Dilaudid yesterday was increased from 1 mg every 2 hours to 1 mg every 4 hours.  Patient tolerated this well.  This a.m. on evaluation she requested only p.o. medications.  She feels as if her pain is rolled well enough to go home on p.o. medications.  She takes chronic Percocet 10 mg.  She has been receiving Percocet 15 mg here while hospitalized, will send oxycodone 5 mg for her to use in conjunction with the Percocet 10 mg that she uses for chronic pain.  Prescription for Robaxin and gabapentin sent to patient's pharmacy as well.  Discussed with patient that she needs to call her provider/pain management provider at [facility] on Monday to discuss follow-up after hospitalization for ongoing management of her chronic pain.   Bowel regimen: MiraLAX and Colace   Psych: Psych is following, appreciate their consult.   DVT prophylaxis currently Lovenox, SCDs and ambulation.  Will not DC with Lovenox, discussed the importance with patient of ambulation frequently while at home.  She was agreeable to this plan.  Disposition: Appreciate general surgery assisting with care.  Patient is scheduled for discharge today.  Discussed patient's case with Dr.  Ladona Ridgel this a.m., discussed disposition planning, discussed discharge pain control.  All of the patient's questions were answered to her content today, she is ready for discharge.    LOS: 8 days    Leslee Home, PA-C 01/01/2023
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
## 2023-01-02 LAB — SURGICAL PATHOLOGY, GROSS ONLY (NOT ARMC)

## 2023-01-10 ENCOUNTER — Encounter: Payer: Self-pay | Admitting: Surgical

## 2023-01-10 ENCOUNTER — Ambulatory Visit (INDEPENDENT_AMBULATORY_CARE_PROVIDER_SITE_OTHER): Payer: Medicaid Other | Admitting: Surgical

## 2023-01-10 VITALS — BP 143/85 | HR 90

## 2023-01-10 DIAGNOSIS — N6452 Nipple discharge: Secondary | ICD-10-CM

## 2023-01-10 NOTE — Progress Notes (Signed)
Referring Provider [facility], [facility] [address] [place],  [place] [address]   CC:  Chief Complaint  Patient presents with   Post-op Follow-up      Leah Shea is an 37 y.o. female.  HPI: Patient is a 37 year old female who underwent bilateral simple mastectomy by Dr. Carolynne Edouard followed by immediate breast reconstruction placement tissue expanders and Flex HD with Dr. Ladona Ridgel on 12/23/2022.  She was having significant pain postoperatively and the decision was made to remove the bilateral breast tissue expanders on 12/25/2022.  Patient remained hospitalized from 12/23/2022 until discharge on 01/01/2023 for postoperative pain control.  She is here today for initial postoperative evaluation.  She reports that overall she is feeling much better, pain is significantly improved.  She has been active and reports good range of motion to her bilateral upper extremities.   She reports that she has been able to decrease the amount of narcotics that she is taking and is taking less narcotics now than the amount she was prior to surgery for her chronic breast pain.  She reports that she has been seen by her PCP and also her pain management provider since surgery and overall everything is going well.  She is not having any infectious symptoms today.  She reports JP drain output over the past few days has been approximately 5 to 10 cc per 24 hours.  Today there has been approximately 5 cc per side.   She does have some questions about next steps in her reconstructive process.  Review of Systems General: No fevers or chills  Physical Exam Chaperone present on exam    01/10/2023   12:53 PM 01/01/2023    3:57 PM 01/01/2023    8:17 AM  Vitals with BMI  Systolic 143 116 130  Diastolic 85 84 71  Pulse 90 73 74    General:  No acute distress,  Alert and oriented, Non-Toxic, Normal speech and affect Pulmonary: Breathing is unlabored Breast: Bilateral mastectomy flaps are viable, bilateral  breast incisions are intact and are healing very nicely.  Bilateral JP drains in place with approximately 5 cc of serosanguineous drainage in the bulb.  There is no erythema or cellulitic changes noted of either surgical site.  Mild tenderness noted with palpation.  No subcutaneous fluid collection noted.  Assessment/Plan 37 year old female here for follow-up after bilateral mastectomy with Dr. Carolynne Edouard on 12/23/2022 followed by immediate breast reconstruction with placement tissue expanders and Flex HD with Dr. Ladona Ridgel.  She subsequently had the bilateral breast tissue expanders removed on 12/25/2022 due to significant postoperative pain uncontrolled on p.o. and IV medications.  Pain has significantly improved.  Her JP drain output today is low and therefore the drains were removed.  Patient tolerated this well and there were no complications.  Recommend gauze dressing changes to the JP drain insertion site wounds daily.  We discussed that some drainage is expected but this should resolve.  We discussed that if she notices increased swelling after drain removal to follow-up with general surgery for evaluation.  We discussed the importance of compression now that the drains are removed.  Recommend following up with general surgery in regards to her ongoing management of her mastectomy incisions.  We had a thorough discussion about the reconstructive process at this time.  It is very reassuring that her pain has improved significantly and she is even decreasing the amount of baseline narcotics that she is taking. We discussed that due to the situation that occurred while she  was hospitalized where she notified provider that she was scared of Dr. Ladona Ridgel, her care will need to be transitioned to another plastic surgeon outside of our office.  She did reiterate today that she was not scared of Dr. Ladona Ridgel. We discussed a referral to another plastic surgery office, but also discussed with the patient to discuss with  Dr. Carolynne Edouard for any recommendations he may have as well. She reports today that she understands this, but would like to stay with our office for care, if possible.  I discussed with the patient that I would discuss her case with Dr. Ladona Ridgel.  I discussed with the patient that I would notify her of any updates.   She will follow-up with general surgery for ongoing postsurgery care.  There is no signs of infection or concern on exam today.  She is healing very well.  Leah Shea 01/10/2023, 2:06 PM

## 2023-01-20 ENCOUNTER — Inpatient Hospital Stay (HOSPITAL_COMMUNITY)
Admission: EM | Admit: 2023-01-20 | Discharge: 2023-01-25 | DRG: 920 | Disposition: A | Discharge status: Home / Self Care | Payer: No Typology Code available for payment source | Attending: General Surgery | Admitting: General Surgery

## 2023-01-20 ENCOUNTER — Emergency Department (HOSPITAL_COMMUNITY): Payer: No Typology Code available for payment source

## 2023-01-20 ENCOUNTER — Encounter (HOSPITAL_COMMUNITY): Payer: Self-pay | Admitting: Emergency Medicine

## 2023-01-20 ENCOUNTER — Other Ambulatory Visit: Payer: Self-pay

## 2023-01-20 DIAGNOSIS — Y834 Other reconstructive surgery as the cause of abnormal reaction of the patient, or of later complication, without mention of misadventure at the time of the procedure: Secondary | ICD-10-CM | POA: Diagnosis present

## 2023-01-20 DIAGNOSIS — L7633 Postprocedural seroma of skin and subcutaneous tissue following a dermatologic procedure: Secondary | ICD-10-CM | POA: Diagnosis not present

## 2023-01-20 DIAGNOSIS — Z882 Allergy status to sulfonamides status: Secondary | ICD-10-CM

## 2023-01-20 DIAGNOSIS — Z832 Family history of diseases of the blood and blood-forming organs and certain disorders involving the immune mechanism: Secondary | ICD-10-CM | POA: Diagnosis not present

## 2023-01-20 DIAGNOSIS — G40209 Localization-related (focal) (partial) symptomatic epilepsy and epileptic syndromes with complex partial seizures, not intractable, without status epilepticus: Secondary | ICD-10-CM | POA: Diagnosis present

## 2023-01-20 DIAGNOSIS — E669 Obesity, unspecified: Secondary | ICD-10-CM | POA: Diagnosis present

## 2023-01-20 DIAGNOSIS — Z800 Family history of malignant neoplasm of digestive organs: Secondary | ICD-10-CM

## 2023-01-20 DIAGNOSIS — N61 Mastitis without abscess: Secondary | ICD-10-CM | POA: Diagnosis present

## 2023-01-20 DIAGNOSIS — G4730 Sleep apnea, unspecified: Secondary | ICD-10-CM | POA: Diagnosis present

## 2023-01-20 DIAGNOSIS — Z9089 Acquired absence of other organs: Secondary | ICD-10-CM

## 2023-01-20 DIAGNOSIS — Z803 Family history of malignant neoplasm of breast: Secondary | ICD-10-CM

## 2023-01-20 DIAGNOSIS — M96843 Postprocedural seroma of a musculoskeletal structure following other procedure: Principal | ICD-10-CM | POA: Diagnosis present

## 2023-01-20 DIAGNOSIS — Z8719 Personal history of other diseases of the digestive system: Secondary | ICD-10-CM

## 2023-01-20 DIAGNOSIS — Z809 Family history of malignant neoplasm, unspecified: Secondary | ICD-10-CM

## 2023-01-20 DIAGNOSIS — F32A Depression, unspecified: Secondary | ICD-10-CM | POA: Diagnosis present

## 2023-01-20 DIAGNOSIS — K589 Irritable bowel syndrome without diarrhea: Secondary | ICD-10-CM | POA: Diagnosis present

## 2023-01-20 DIAGNOSIS — Z9889 Other specified postprocedural states: Secondary | ICD-10-CM

## 2023-01-20 DIAGNOSIS — Z8249 Family history of ischemic heart disease and other diseases of the circulatory system: Secondary | ICD-10-CM | POA: Diagnosis not present

## 2023-01-20 DIAGNOSIS — Z833 Family history of diabetes mellitus: Secondary | ICD-10-CM | POA: Diagnosis not present

## 2023-01-20 DIAGNOSIS — Z79899 Other long term (current) drug therapy: Secondary | ICD-10-CM | POA: Diagnosis not present

## 2023-01-20 DIAGNOSIS — J45909 Unspecified asthma, uncomplicated: Secondary | ICD-10-CM | POA: Diagnosis present

## 2023-01-20 DIAGNOSIS — F4310 Post-traumatic stress disorder, unspecified: Secondary | ICD-10-CM | POA: Diagnosis present

## 2023-01-20 DIAGNOSIS — Z87891 Personal history of nicotine dependence: Secondary | ICD-10-CM

## 2023-01-20 DIAGNOSIS — Z886 Allergy status to analgesic agent status: Secondary | ICD-10-CM | POA: Diagnosis not present

## 2023-01-20 DIAGNOSIS — Z6833 Body mass index (BMI) 33.0-33.9, adult: Secondary | ICD-10-CM

## 2023-01-20 DIAGNOSIS — K219 Gastro-esophageal reflux disease without esophagitis: Secondary | ICD-10-CM | POA: Diagnosis present

## 2023-01-20 DIAGNOSIS — D573 Sickle-cell trait: Secondary | ICD-10-CM | POA: Diagnosis present

## 2023-01-20 DIAGNOSIS — Z9013 Acquired absence of bilateral breasts and nipples: Secondary | ICD-10-CM | POA: Diagnosis not present

## 2023-01-20 DIAGNOSIS — F419 Anxiety disorder, unspecified: Secondary | ICD-10-CM | POA: Diagnosis present

## 2023-01-20 DIAGNOSIS — Z8632 Personal history of gestational diabetes: Secondary | ICD-10-CM

## 2023-01-20 DIAGNOSIS — Z87892 Personal history of anaphylaxis: Secondary | ICD-10-CM

## 2023-01-20 DIAGNOSIS — G43909 Migraine, unspecified, not intractable, without status migrainosus: Secondary | ICD-10-CM | POA: Diagnosis present

## 2023-01-20 DIAGNOSIS — Z91018 Allergy to other foods: Secondary | ICD-10-CM

## 2023-01-20 DIAGNOSIS — Z98891 History of uterine scar from previous surgery: Secondary | ICD-10-CM

## 2023-01-20 DIAGNOSIS — Z8041 Family history of malignant neoplasm of ovary: Secondary | ICD-10-CM

## 2023-01-20 DIAGNOSIS — L0390 Cellulitis, unspecified: Principal | ICD-10-CM

## 2023-01-20 DIAGNOSIS — D75A Glucose-6-phosphate dehydrogenase (G6PD) deficiency without anemia: Secondary | ICD-10-CM | POA: Diagnosis present

## 2023-01-20 DIAGNOSIS — Z8042 Family history of malignant neoplasm of prostate: Secondary | ICD-10-CM

## 2023-01-20 LAB — COMPREHENSIVE METABOLIC PANEL
ALT: 10 U/L (ref 0–44)
AST: 9 U/L — ABNORMAL LOW (ref 15–41)
Albumin: 3.3 g/dL — ABNORMAL LOW (ref 3.5–5.0)
Alkaline Phosphatase: 73 U/L (ref 38–126)
Anion gap: 12 (ref 5–15)
BUN: 8 mg/dL (ref 6–20)
CO2: 23 mmol/L (ref 22–32)
Calcium: 9.7 mg/dL (ref 8.9–10.3)
Chloride: 102 mmol/L (ref 98–111)
Creatinine, Ser: 0.84 mg/dL (ref 0.44–1.00)
GFR, Estimated: >60 mL/min (ref >60)
Glucose, Bld: 104 mg/dL — ABNORMAL HIGH (ref 70–99)
Potassium: 3.7 mmol/L (ref 3.5–5.1)
Sodium: 137 mmol/L (ref 135–145)
Total Bilirubin: 0.5 mg/dL (ref <1.2)
Total Protein: 7.2 g/dL (ref 6.5–8.1)

## 2023-01-20 LAB — PROTIME-INR
INR: 1.1 (ref 0.8–1.2)
Prothrombin Time: 14 s (ref 11.4–15.2)

## 2023-01-20 LAB — CBC WITH DIFFERENTIAL/PLATELET
Abs Immature Granulocytes: 0.02 10*3/uL (ref 0.00–0.07)
Basophils Absolute: 0.1 10*3/uL (ref 0.0–0.1)
Basophils Relative: 1 %
Eosinophils Absolute: 0.1 10*3/uL (ref 0.0–0.5)
Eosinophils Relative: 2 %
HCT: 33.6 % — ABNORMAL LOW (ref 36.0–46.0)
Hemoglobin: 11 g/dL — ABNORMAL LOW (ref 12.0–15.0)
Immature Granulocytes: 0 %
Lymphocytes Relative: 31 %
Lymphs Abs: 2.5 10*3/uL (ref 0.7–4.0)
MCH: 28.6 pg (ref 26.0–34.0)
MCHC: 32.7 g/dL (ref 30.0–36.0)
MCV: 87.5 fL (ref 80.0–100.0)
Monocytes Absolute: 0.6 10*3/uL (ref 0.1–1.0)
Monocytes Relative: 7 %
Neutro Abs: 4.9 10*3/uL (ref 1.7–7.7)
Neutrophils Relative %: 59 %
Platelets: 293 10*3/uL (ref 150–400)
RBC: 3.84 MIL/uL — ABNORMAL LOW (ref 3.87–5.11)
RDW: 12.5 % (ref 11.5–15.5)
WBC: 8.2 10*3/uL (ref 4.0–10.5)
nRBC: 0 % (ref 0.0–0.2)

## 2023-01-20 LAB — I-STAT CG4 LACTIC ACID, ED
Lactic Acid, Venous: 0.3 mmol/L — ABNORMAL LOW (ref 0.5–1.9)
Lactic Acid, Venous: 0.5 mmol/L (ref 0.5–1.9)

## 2023-01-20 LAB — APTT: aPTT: 31 s (ref 24–36)

## 2023-01-20 LAB — HCG, SERUM, QUALITATIVE: Preg, Serum: NEGATIVE

## 2023-01-20 MED ORDER — VANCOMYCIN HCL IN DEXTROSE 1-5 GM/200ML-% IV SOLN
1000.0000 mg | Freq: Once | INTRAVENOUS | Status: AC
  Administered 2023-01-21: 1000 mg via INTRAVENOUS
  Filled 2023-01-20: qty 200

## 2023-01-20 MED ORDER — SCOPOLAMINE 1 MG/3DAYS TD PT72
1.0000 | MEDICATED_PATCH | TRANSDERMAL | Status: DC | PRN
  Administered 2023-01-24: 1.5 mg via TRANSDERMAL
  Filled 2023-01-20: qty 1

## 2023-01-20 MED ORDER — ALBUTEROL SULFATE (2.5 MG/3ML) 0.083% IN NEBU: 2.5000 mg | INHALATION_SOLUTION | Freq: Four times a day (QID) | RESPIRATORY_TRACT | Status: DC | PRN

## 2023-01-20 MED ORDER — HYDRALAZINE HCL 20 MG/ML IJ SOLN: 10.0000 mg | INTRAMUSCULAR | Status: DC | PRN

## 2023-01-20 MED ORDER — OXYCODONE HCL 5 MG PO TABS
5.0000 mg | ORAL_TABLET | Freq: Once | ORAL | Status: AC
Start: 2023-01-20 — End: 2023-01-20
  Administered 2023-01-20: 5 mg via ORAL
  Filled 2023-01-20: qty 1

## 2023-01-20 MED ORDER — LEVETIRACETAM 500 MG PO TABS
1000.0000 mg | ORAL_TABLET | Freq: Two times a day (BID) | ORAL | Status: DC
  Administered 2023-01-21 – 2023-01-25 (×10): 1000 mg via ORAL
  Filled 2023-01-20 (×10): qty 2

## 2023-01-20 MED ORDER — ENOXAPARIN SODIUM 40 MG/0.4ML IJ SOSY
40.0000 mg | PREFILLED_SYRINGE | INTRAMUSCULAR | Status: DC
  Administered 2023-01-21 – 2023-01-25 (×5): 40 mg via SUBCUTANEOUS
  Filled 2023-01-20 (×5): qty 0.4

## 2023-01-20 MED ORDER — ALPRAZOLAM 0.5 MG PO TABS
1.0000 mg | ORAL_TABLET | Freq: Three times a day (TID) | ORAL | Status: DC
  Administered 2023-01-21 – 2023-01-25 (×12): 1 mg via ORAL
  Filled 2023-01-20 (×7): qty 2
  Filled 2023-01-20: qty 4
  Filled 2023-01-20 (×5): qty 2

## 2023-01-20 MED ORDER — SUMATRIPTAN SUCCINATE 6 MG/0.5ML ~~LOC~~ SOLN: 6.0000 mg | Freq: Two times a day (BID) | SUBCUTANEOUS | Status: DC | PRN

## 2023-01-20 MED ORDER — LORAZEPAM 1 MG PO TABS
1.0000 mg | ORAL_TABLET | Freq: Once | ORAL | Status: AC
  Administered 2023-01-20: 1 mg via ORAL
  Filled 2023-01-20: qty 1

## 2023-01-20 MED ORDER — ONDANSETRON 4 MG PO TBDP
4.0000 mg | ORAL_TABLET | ORAL | Status: DC | PRN
  Administered 2023-01-21 – 2023-01-25 (×6): 4 mg via ORAL
  Filled 2023-01-20 (×6): qty 1

## 2023-01-20 MED ORDER — PANTOPRAZOLE SODIUM 40 MG PO TBEC
40.0000 mg | DELAYED_RELEASE_TABLET | Freq: Every day | ORAL | Status: DC
  Administered 2023-01-21 – 2023-01-25 (×5): 40 mg via ORAL
  Filled 2023-01-20 (×5): qty 1

## 2023-01-20 MED ORDER — OXYCODONE-ACETAMINOPHEN 10-325 MG PO TABS: 1.0000 | ORAL_TABLET | Freq: Four times a day (QID) | ORAL | Status: DC

## 2023-01-20 MED ORDER — MORPHINE SULFATE (PF) 4 MG/ML IV SOLN
4.0000 mg | INTRAVENOUS | Status: DC | PRN
  Administered 2023-01-21 (×3): 4 mg via INTRAVENOUS
  Filled 2023-01-20 (×3): qty 1

## 2023-01-20 MED ORDER — LAMOTRIGINE 100 MG PO TABS
200.0000 mg | ORAL_TABLET | Freq: Two times a day (BID) | ORAL | Status: DC
  Administered 2023-01-21 – 2023-01-25 (×10): 200 mg via ORAL
  Filled 2023-01-20 (×10): qty 2

## 2023-01-20 NOTE — ED Provider Triage Note (Signed)
Emergency Medicine Provider Triage Evaluation Note  Leah Shea , a 37 y.o. female  was evaluated in triage.  Pt complains of left breast pain and swelling. Had double mastectomy in October. Reports fever and chills.  Took tylenol and ibuprofen for fever earlier today   Review of Systems  Positive: As above Negative: As above  Physical Exam  BP (!) 131/91 (BP Location: Right Arm)   Pulse 98   Temp 99.4 F (37.4 C) (Oral)   Resp 18   SpO2 99%  Gen:   Awake, tearful Resp:  Normal effort  MSK:   Moves extremities without difficulty  Other:  Left and right breast with surgical scar well approximated without drainage. Left breast edematous  Medical Decision Making  Medically screening exam initiated at 4:25 PM.  Appropriate orders placed.  Leah Shea was informed that the remainder of the evaluation will be completed by another provider, this initial triage assessment does not replace that evaluation, and the importance of remaining in the ED until their evaluation is complete.     Arabella Merles, PA-C 01/20/23 1625

## 2023-01-20 NOTE — H&P (Signed)
Leah Shea is an 37 y.o. female.   Chief Complaint: pain L mastectomy site HPI: 37yo F who underwent bilateral simple mastectomy by Dr. Carolynne Edouard followed by immediate breast reconstruction placement tissue expanders and Flex HD with Dr. Ladona Ridgel on 12/23/2022. She had significant post-op pain and the expanders were removed by Dr. Ladona Ridgel 12/25/22. She presents to the ED tonight with 1d HX pain and swelling L mastectomy site. I was asked to see her for surgical management.   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   Transferred from DOD/VA problem list   IBS (irritable bowel syndrome)    Nausea and vomiting during pregnancy 07/25/2020   Pituitary tumor    [facility], 12/15/2022   PTSD (post-traumatic stress disorder)    Shortness of breath    with exertion or panic attack   Sickle cell trait (HCC)    Sleep apnea    awaiting a sleep study to be done at [facility] ([facility])    Past Surgical History:  Procedure Laterality Date   ABDOMINAL HERNIA REPAIR  10/08/2020   BREAST RECONSTRUCTION WITH PLACEMENT OF TISSUE EXPANDER AND ALLODERM Bilateral 12/23/2022   Procedure: BREAST RECONSTRUCTION WITH PLACEMENT OF TISSUE EXPANDER;  Surgeon: Santiago Glad, MD;  Location: [facility] OR;  Service: Plastics;  Laterality: Bilateral;   BREAST SURGERY Bilateral    reductions   CESAREAN SECTION  2010, [id number]   x 3   CESAREAN SECTION N/A 09/28/2020   Procedure: CESAREAN SECTION;  Surgeon: Venora Maples, MD;  Location: [facility] LD ORS;  Service: Obstetrics;  Laterality: N/A;   EYE SURGERY     x 10 as a child   LAPAROTOMY N/A 10/08/2020   Procedure: EXPLORATORY LAPAROTOMY WITH CLOSURE ABDOMINAL WALL DEFECT POSSIBLE  BOWEL RESECTION;  Surgeon: Harriette Bouillon, MD;  Location: [facility] OR;  Service: General;  Laterality: N/A;   PILONIDAL CYST EXCISION  2020   REDUCTION MAMMAPLASTY     REMOVAL OF TISSUE EXPANDER AND PLACEMENT OF IMPLANT Bilateral 12/25/2022   Procedure: REMOVAL OF BILATERAL BREAST TISSUE EXPANDERS;  Surgeon: Santiago Glad, MD;  Location: [facility] OR;  Service: Plastics;  Laterality: Bilateral;   SIMPLE MASTECTOMY WITH AXILLARY SENTINEL NODE BIOPSY Bilateral 12/23/2022   Procedure: BILATERAL SIMPLE MASTECTOMY;  Surgeon: Griselda Miner, MD;  Location: [facility] OR;  Service: General;  Laterality: Bilateral;  PEC BLOCK   TONSILLECTOMY N/A 10/07/2013   Procedure: TONSILLECTOMY;  Surgeon: Christia Reading, MD;  Location[facility] OR;  Service: ENT;  Laterality: N/A;   TUMOR REMOVAL     tumor removed from back    Family History  Problem Relation Age of Onset   Breast cancer Mother    Clotting disorder Mother    Diabetes Mother    Heart disease Mother    Breast cancer Maternal Grandmother    Diabetes Maternal Grandmother    Prostate cancer Maternal Grandfather        metastatic   Pancreatic cancer Maternal Grandfather    Colon cancer Maternal Grandfather    Diabetes Maternal Grandfather    Breast cancer Maternal Aunt 36       metastatic   Cancer Maternal Aunt  either breast or ovarian    Cancer Maternal Uncle        unk type   Colon polyps Neg Hx    Stomach cancer Neg Hx    Esophageal cancer Neg Hx    Social History:  reports that she quit smoking about 9 years ago. Her smoking use included cigarettes. She started smoking about 10 years ago. She has a 0.3 pack-year smoking history. She has never used smokeless tobacco. She reports that she does not currently use alcohol. She reports that she does not currently use drugs after having used the following drugs: Marijuana.  Allergies:  Allergies  Allergen Reactions   Mushroom Extract Complex (Do Not Select) Anaphylaxis and Hives   Other Anaphylaxis and  Hives    Mushrooms   Nsaids Hives, Swelling and Other (See Comments)    "Body burns" Tolerated celecoxib 09/2020    Asa [Aspirin] Hives   Prenatal Vitamins Nausea And Vomiting and Other (See Comments)    Body did not tolerated any type per patient   Compazine [Prochlorperazine] Anxiety   Reglan [Metoclopramide] Anxiety   Sulfa Antibiotics Hives, Itching and Other (See Comments)    "Teary eyes/itching throat/anxiety"    (Not in a hospital admission)   Results for orders placed or performed during the hospital encounter of 01/20/23 (from the past 48 hour(s))  Comprehensive metabolic panel     Status: Abnormal   Collection Time: 01/20/23  4:33 PM  Result Value Ref Range   Sodium 137 135 - 145 mmol/L   Potassium 3.7 3.5 - 5.1 mmol/L   Chloride 102 98 - 111 mmol/L   CO2 23 22 - 32 mmol/L   Glucose, Bld 104 (H) 70 - 99 mg/dL    Comment: Glucose reference range applies only to samples taken after fasting for at least 8 hours.   BUN 8 6 - 20 mg/dL   Creatinine, Ser 1.61 0.44 - 1.00 mg/dL   Calcium 9.7 8.9 - 09.6 mg/dL   Total Protein 7.2 6.5 - 8.1 g/dL   Albumin 3.3 (L) 3.5 - 5.0 g/dL   AST 9 (L) 15 - 41 U/L   ALT 10 0 - 44 U/L   Alkaline Phosphatase 73 38 - 126 U/L   Total Bilirubin 0.5 <1.2 mg/dL   GFR, Estimated >04 >54 mL/min    Comment: (NOTE) Calculated using the CKD-EPI Creatinine Equation (2021)    Anion gap 12 5 - 15    Comment: Performed at [facility], [address] [place], [place] [address]  CBC with Differential     Status: Abnormal   Collection Time: 01/20/23  4:33 PM  Result Value Ref Range   WBC 8.2 4.0 - 10.5 K/uL   RBC 3.84 (L) 3.87 - 5.11 MIL/uL   Hemoglobin 11.0 (L) 12.0 - 15.0 g/dL   HCT 91.4 (L) 78.2 - 95.6 %   MCV 87.5 80.0 - 100.0 fL   MCH 28.6 26.0 - 34.0 pg   MCHC 32.7 30.0 - 36.0 g/dL   RDW 21.3 08.6 - 57.8 %   Platelets 293 150 - 400 K/uL   nRBC 0.0 0.0 - 0.2 %   Neutrophils Relative % 59 %   Neutro Abs 4.9 1.7 - 7.7 K/uL    Lymphocytes Relative 31 %   Lymphs Abs 2.5 0.7 - 4.0 K/uL   Monocytes Relative 7 %   Monocytes Absolute 0.6 0.1 - 1.0 K/uL   Eosinophils Relative 2 %   Eosinophils Absolute 0.1  0.0 - 0.5 K/uL   Basophils Relative 1 %   Basophils Absolute 0.1 0.0 - 0.1 K/uL   Immature Granulocytes 0 %   Abs Immature Granulocytes 0.02 0.00 - 0.07 K/uL    Comment: Performed at [facility], [address], [place], [place] [address]  Protime-INR     Status: None   Collection Time: 01/20/23  4:33 PM  Result Value Ref Range   Prothrombin Time 14.0 11.4 - 15.2 seconds   INR 1.1 0.8 - 1.2    Comment: (NOTE) INR goal varies based on device and disease states. Performed at [facility], [address], [place], [place] [address]   APTT     Status: None   Collection Time: 01/20/23  4:33 PM  Result Value Ref Range   aPTT 31 24 - 36 seconds    Comment: Performed at [facility], [address], [place], [place] [address]  hCG, serum, qualitative     Status: None   Collection Time: 01/20/23  4:33 PM  Result Value Ref Range   Preg, Serum NEGATIVE NEGATIVE    Comment:        THE SENSITIVITY OF THIS METHODOLOGY IS >10 mIU/mL. Performed at [facility], [address] [place], [place] [address]   I-Stat Lactic Acid, ED     Status: Abnormal   Collection Time: 01/20/23  4:49 PM  Result Value Ref Range   Lactic Acid, Venous 0.3 (L) 0.5 - 1.9 mmol/L   [place] CHEST SOFT TISSUE  Addendum Date: 01/20/2023   ADDENDUM REPORT: 01/20/2023 19:49 ADDENDUM: Findings discussed with Dr. Rhae Hammock. Electronically Signed   By: Layla Maw M.D.   On: 01/20/2023 19:49   Result Date: 01/20/2023 CLINICAL DATA:  Status post double mastectomy 12/23/2022. Left-sided pain, erythema and swelling. EXAM: Ultrasound of the left breast TECHNIQUE: Ultrasound examination of the left breast. COMPARISON:  None Available. FINDINGS: There is a complex subcutaneous fluid collection in the left breast measuring least 8 x 7 x  4 cm. There are internal septations which are avascular with Doppler imaging. Differential for this collection would include postop seroma versus abscess. Needle aspiration could be helpful for making this distinction. Comparison images of the right breast demonstrate no similar collection. IMPRESSION: Complex fluid collection in the left breast. Differential abscess versus postop seroma. Electronically Signed: By: Layla Maw M.D. On: 01/20/2023 19:42    Review of Systems  Blood pressure (!) 167/103, pulse 83, temperature 98.1 F (36.7 C), temperature source Oral, resp. rate 17, SpO2 100%, unknown if currently breastfeeding. Physical Exam Cardiovascular:     Rate and Rhythm: Normal rate and regular rhythm.  Pulmonary:     Effort: Pulmonary effort is normal.     Breath sounds: Normal breath sounds.  Abdominal:     General: Abdomen is flat.     Palpations: Abdomen is soft.  Skin:    Comments: Right mastectomy site incision looks okay.  No significant swelling or erythema.  Left mastectomy site is swollen with some erythema.  There is no drainage.  There is tenderness present.  Neurological:     Mental Status: She is alert and oriented to person, place, and time.      Assessment/Plan Status post bilateral mastectomy by Dr. Carolynne Edouard with immediate tissue expander placement by Dr. Ladona Ridgel 12/23/2022  Status post removal of bilateral tissue expanders by Dr. Ladona Ridgel 11/10/22024  Left mastectomy site cellulitis with seroma versus abscess -will admit for IV antibiotics.  It is possible this fluid collection may need to be drained.  We will discuss further with Dr. Ladona Ridgel as well.  I discussed the plan with the patient.  Liz Malady, MD 01/20/2023, 9:19 PM

## 2023-01-20 NOTE — ED Triage Notes (Signed)
Pt had double mastectomy in November.  Discharged 11/19.  Pt reports she has marked increased swelling on the left side, orange peel appearance to her skin, minimal drainage, but "opening of the wound" on the left.  Pt unable to see general surgery for another week.  Pt is tearful at time of triage.

## 2023-01-20 NOTE — ED Provider Notes (Signed)
[place] EMERGENCY DEPARTMENT AT [facility] Provider Note   CSN: [id number] Arrival date & time: 01/20/23  1555     History  Chief Complaint  Patient presents with   Post-op Problem    Leah Shea is a 37 y.o. female.  37 year old female with past medical history of PTSD with recent mastectomy secondary to recurrent bloody discharge from both breasts on 8 November presenting to the emergency department today with swelling of her left breast.  The patient states that she has been having swelling of her left breast over the past few days.  She states that she followed up with her primary care doctor and was placed on Augmentin.  She completed a 2-week course few days ago.  She states that her pain has gotten worse over the past few days.  She states that there was an area that she was concern for some mild wound dehiscence but she has not noticed any significant drainage.  She had an e-visit with her primary care provider today and was told to come to the ER for further evaluation.  She reports subjective fevers and chills.        Home Medications Prior to Admission medications   Medication Sig Start Date End Date Taking? Authorizing Provider  acetaminophen (TYLENOL) 160 MG/5ML elixir Take 320 mg by mouth in the morning and at bedtime.    [provider]  albuterol (PROVENTIL) (2.5 MG/3ML) 0.083% nebulizer solution Take 2.5 mg by nebulization every 6 (six) hours as needed for wheezing or shortness of breath. 03/29/11   [provider]  ALPRAZolam Prudy Feeler) 1 MG tablet Take 1 mg by mouth in the morning, at noon, and at bedtime. 11/12/22   [provider]  escitalopram (LEXAPRO) 20 MG tablet TAKE ONE TABLET BY MOUTH ONCE A DAY FOR DEPRESSION AND ANXIETY 11/10/20   [provider]  gabapentin (NEURONTIN) 100 MG capsule Take 1 capsule (100 mg total) by mouth 3 (three) times daily for 6 days. 01/01/23 01/07/23  Scheeler, Kermit Balo, PA-C   lamoTRIgine (LAMICTAL) 200 MG tablet Take 200 mg by mouth 2 (two) times daily. 06/03/21   [provider]  levETIRAcetam (KEPPRA) 500 MG tablet Take 1,000 mg by mouth 2 (two) times daily. 06/03/21   [provider]  omeprazole (PRILOSEC) 20 MG capsule Take 1 capsule (20 mg total) by mouth daily. Patient taking differently: Take 20 mg by mouth daily before breakfast. 05/05/21   Jenel Lucks, MD  ondansetron (ZOFRAN-ODT) 4 MG disintegrating tablet Take 1 tablet (4 mg total) by mouth every 4 (four) hours as needed for nausea or vomiting. Patient taking differently: Take 4 mg by mouth every 4 (four) hours. 09/22/21   Rolm Bookbinder, CNM  oxyCODONE-acetaminophen (PERCOCET) 10-325 MG tablet Take 1 tablet by mouth every 6 (six) hours.    [provider]  promethazine (PHENERGAN) 25 MG tablet Take 1 tablet (25 mg total) by mouth every 8 (eight) hours as needed for nausea or vomiting. Patient taking differently: Take 25 mg by mouth at bedtime. 09/22/21   Rolm Bookbinder, CNM  scopolamine (TRANSDERM-SCOP) 1 MG/3DAYS Place 1 patch onto the skin every three (3) days as needed (nausea/vomiting).    [provider]  SUMAtriptan (IMITREX) 6 MG/0.5ML SOSY injection INJECT 6MG  (ONE SINGLE DOSE AUTOINJECTOR) SUBCUTANEOUSLY TWICE A DAY AS NEEDED FOR MIGRAINE 06/03/21   [provider]  VENTOLIN HFA 108 (90 Base) MCG/ACT inhaler Inhale 2 puffs into the lungs every 4 (  four) hours as needed for wheezing or shortness of breath. 10/27/22   [provider]  Vitamin D, Ergocalciferol, (DRISDOL) 1.25 MG (50000 UNIT) CAPS capsule Take 50,000 Units by mouth every Sunday. 09/22/22   [provider]      Allergies    Mushroom extract complex (do not select), Other, Nsaids, Asa [aspirin], Prenatal vitamins, Compazine [prochlorperazine], Reglan [metoclopramide], and Sulfa antibiotics    Review of Systems   Review of Systems  Skin:  Positive for wound.  All other  systems reviewed and are negative.   Physical Exam Updated Vital Signs BP (!) 167/103   Pulse 83   Temp 98.1 F (36.7 C) (Oral)   Resp 17   Ht 5\' 5"  (1.651 m)   Wt 90.7 kg   SpO2 100%   BMI 33.28 kg/m  Physical Exam Vitals and nursing note reviewed.   Gen: NAD Eyes: PERRL, EOMI HEENT: no oropharyngeal swelling Neck: trachea midline Resp: clear to auscultation bilaterally Breast: Chaperoned by female nursing staff does show some induration of the left breast and mild erythema and warmth, I do not appreciate any significant wound dehiscence, no drainage appreciated Card: RRR, no murmurs, rubs, or gallops Abd: nontender, nondistended Extremities: no calf tenderness, no edema Vascular: 2+ radial pulses bilaterally, 2+ DP pulses bilaterally Skin: no rashes Psyc: Anxious, tearful   ED Results / Procedures / Treatments   Labs (all labs ordered are listed, but only abnormal results are displayed) Labs Reviewed  COMPREHENSIVE METABOLIC PANEL - Abnormal; Notable for the following components:      Result Value   Glucose, Bld 104 (*)    Albumin 3.3 (*)    AST 9 (*)    All other components within normal limits  CBC WITH DIFFERENTIAL/PLATELET - Abnormal; Notable for the following components:   RBC 3.84 (*)    Hemoglobin 11.0 (*)    HCT 33.6 (*)    All other components within normal limits  I-STAT CG4 LACTIC ACID, ED - Abnormal; Notable for the following components:   Lactic Acid, Venous 0.3 (*)    All other components within normal limits  CULTURE, BLOOD (ROUTINE X 2)  CULTURE, BLOOD (ROUTINE X 2)  PROTIME-INR  APTT  HCG, SERUM, QUALITATIVE  I-STAT CG4 LACTIC ACID, ED    EKG None  Radiology [place] CHEST SOFT TISSUE  Addendum Date: 01/20/2023   ADDENDUM REPORT: 01/20/2023 19:49 ADDENDUM: Findings discussed with Dr. Rhae Hammock. Electronically Signed   By: Layla Maw M.D.   On: 01/20/2023 19:49   Result Date: 01/20/2023 CLINICAL DATA:  Status post double mastectomy  12/23/2022. Left-sided pain, erythema and swelling. EXAM: Ultrasound of the left breast TECHNIQUE: Ultrasound examination of the left breast. COMPARISON:  None Available. FINDINGS: There is a complex subcutaneous fluid collection in the left breast measuring least 8 x 7 x 4 cm. There are internal septations which are avascular with Doppler imaging. Differential for this collection would include postop seroma versus abscess. Needle aspiration could be helpful for making this distinction. Comparison images of the right breast demonstrate no similar collection. IMPRESSION: Complex fluid collection in the left breast. Differential abscess versus postop seroma. Electronically Signed: By: Layla Maw M.D. On: 01/20/2023 19:42    Procedures Procedures    Medications Ordered in ED Medications  oxyCODONE (Oxy IR/ROXICODONE) immediate release tablet 5 mg (5 mg Oral Given 01/20/23 1634)  LORazepam (ATIVAN) tablet 1 mg (1 mg Oral Given 01/20/23 2044)    ED Course/ Medical Decision Making/ A&P  Medical Decision Making 37 year old female with past medical history of anxiety/PTSD with recent mastectomy presents the emergency department today with pain and swelling of her left breast.  There is some induration here but I do not appreciate any significant wound dehiscence.  Will further evaluate here with basic labs as well as an ultrasound to evaluate for fluid collection.  I will give patient Ativan for her anxiety she has appear very anxious here.  I will reevaluate for ultimate disposition.  The patient's labs are reassuring.  Her US shows post op seroma vs abscess.  Dr. Laurell Josephs came down and evaluated the patient and will admit for cellulitis.  Vancomycin is ordered.  She is admitted for further evaluation.   Amount and/or Complexity of Data Reviewed Radiology: ordered.  Risk Prescription drug management. Decision regarding hospitalization.           Final  Clinical Impression(s) / ED Diagnoses Final diagnoses:  Cellulitis, unspecified cellulitis site    Rx / DC Orders ED Discharge Orders     None         Durwin Glaze, MD 01/20/23 2126

## 2023-01-20 NOTE — Progress Notes (Signed)
ED Pharmacy Antibiotic Sign Off An antibiotic consult was received from an ED provider for Vancomycin per pharmacy dosing for Cellulitis. A chart review was completed to assess appropriateness.   The following one time order(s) were placed:  Vancomycin 1000mg  IV x2 for a total loading dose of 2000mg    Further antibiotic and/or antibiotic pharmacy consults should be ordered by the admitting provider if indicated.   Thank you for allowing pharmacy to be a part of this patient's care.   Leah Shea, PharmD, BCPS Clinical Pharmacist 01/20/2023 10:19 PM   Please refer to [facility] for pharmacy phone number

## 2023-01-20 NOTE — ED Notes (Signed)
ED TO INPATIENT HANDOFF REPORT  ED Nurse Name and Phone #: Manus Gunning, RN   S Name/Age/Gender Leah Shea Thressa Sheller 37 y.o. female Room/Bed: 007C/007C  Code Status   Code Status: Full Code  Home/SNF/Other Home Patient oriented to: self, place, time, and situation Is this baseline? Yes   Triage Complete: Triage complete  Chief Complaint Cellulitis of breast [N61.0]  Triage Note Pt had double mastectomy in November.  Discharged 11/19.  Pt reports she has marked increased swelling on the left side, orange peel appearance to her skin, minimal drainage, but "opening of the wound" on the left.  Pt unable to see general surgery for another week.  Pt is tearful at time of triage.    Allergies Allergies  Allergen Reactions   Mushroom Extract Complex (Do Not Select) Anaphylaxis and Hives   Other Anaphylaxis and Hives    Mushrooms   Nsaids Hives, Swelling and Other (See Comments)    "Body burns" Tolerated celecoxib 09/2020    Asa [Aspirin] Hives   Prenatal Vitamins Nausea And Vomiting and Other (See Comments)    Body did not tolerated any type per patient   Compazine [Prochlorperazine] Anxiety   Reglan [Metoclopramide] Anxiety   Sulfa Antibiotics Hives, Itching and Other (See Comments)    "Teary eyes/itching throat/anxiety"    Level of Care/Admitting Diagnosis ED Disposition     ED Disposition  Admit   Condition  --   Comment  Hospital Area: [facility] [[id number]] Level of Care: Med-Surg [16] May admit patient to [facility] or [facility] if equivalent level of care is available:: No Covid Evaluation: Confirmed COVID Negative Diagnosis: Cellulitis of bre ast [098119] Admitting Physician: Violeta Gelinas [[id number]] Attending Physician: CCS, MD [[id number]] Bed request comments: 6N or 5N Certification:: I certify this patient will need inpatient services for at least 2 midnights Expected Medical Readiness:  01/23/2023          B Medical/Surgery History Past  Medical History:  Diagnosis Date   Anemia    Anxiety    Asthma    Bronchitis, acute    Chronic hypertension affecting pregnancy 04/30/2020   Complex partial seizures (HCC) 2020   Depression    Diverticular disease    G6PD deficiency    Gestational diabetes 08/26/2020   Headache(784.0)    migraines   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   Transferred from DOD/VA problem list   IBS (irritable bowel syndrome)    Nausea and vomiting during pregnancy 07/25/2020   Pituitary tumor    [facility], 12/15/2022   PTSD (post-traumatic stress disorder)    Shortness of breath    with exertion or panic attack   Sickle cell trait (HCC)    Sleep apnea    awaiting a sleep study to be done at [facility]   Past Surgical History:  Procedure Laterality Date   ABDOMINAL HERNIA REPAIR  10/08/2020   BREAST RECONSTRUCTION WITH PLACEMENT OF TISSUE EXPANDER AND ALLODERM Bilateral 12/23/2022   Procedure: BREAST RECONSTRUCTION WITH PLACEMENT OF TISSUE EXPANDER;  Surgeon: Santiago Glad, MD;  Location: [facility] OR;  Service: Plastics;  Laterality: Bilateral;   BREAST SURGERY Bilateral    reductions   CESAREAN SECTION  2010, [id number]   x 3   CESAREAN SECTION N/A 09/28/2020   Procedure: CESAREAN SECTION;  Surgeon: Venora Maples, MD;  Location: [facility] LD ORS;  Service: Obstetrics;  Laterality: N/A;   EYE SURGERY     x 10 as a child   LAPAROTOMY N/A  10/08/2020   Procedure: EXPLORATORY LAPAROTOMY WITH CLOSURE ABDOMINAL WALL DEFECT POSSIBLE BOWEL RESECTION;  Surgeon: Harriette Bouillon, MD;  Location: [facility] OR;  Service: General;  Laterality: N/A;   PILONIDAL CYST EXCISION  2020   REDUCTION MAMMAPLASTY     REMOVAL OF TISSUE EXPANDER AND PLACEMENT OF IMPLANT Bilateral 12/25/2022   Procedure: REMOVAL OF BILATERAL BREAST TISSUE EXPANDERS;  Surgeon: Santiago Glad, MD;  Location: [facility] OR;  Service: Plastics;  Laterality: Bilateral;   SIMPLE MASTECTOMY WITH AXILLARY SENTINEL NODE BIOPSY Bilateral 12/23/2022   Procedure:  BILATERAL SIMPLE MASTECTOMY;  Surgeon: Griselda Miner, MD;  Location: [facility] OR;  Service: General;  Laterality: Bilateral;  PEC BLOCK   TONSILLECTOMY N/A 10/07/2013   Procedure: TONSILLECTOMY;  Surgeon: Christia Reading, MD;  Location: [facility] OR;  Service: ENT;  Laterality: N/A;   TUMOR REMOVAL     tumor removed from back     A IV Location/Drains/Wounds Patient Lines/Drains/Airways Status     Active Line/Drains/Airways     Name Placement date Placement time Site Days   Peripheral IV 01/20/23 20 G 1" Anterior;Proximal;Right Forearm 01/20/23  2136  Forearm  less than 1   Closed System Drain 1 Inferior;Lateral;Right Breast Bulb (JP) 19 Fr. 12/25/22  1048  Breast  26   Closed System Drain 2 Left Breast Bulb (JP) 19 Fr. 12/25/22  1049  Breast  26            Intake/Output Last 24 hours No intake or output data in the 24 hours ending 01/20/23 2205  Labs/Imaging Results for orders placed or performed during the hospital encounter of 01/20/23 (from the past 48 hour(s))  Comprehensive metabolic panel     Status: Abnormal   Collection Time: 01/20/23  4:33 PM  Result Value Ref Range   Sodium 137 135 - 145 mmol/L   Potassium 3.7 3.5 - 5.1 mmol/L   Chloride 102 98 - 111 mmol/L   CO2 23 22 - 32 mmol/L   Glucose, Bld 104 (H) 70 - 99 mg/dL    Comment: Glucose reference range applies only to samples taken after fasting for at least 8 hours.   BUN 8 6 - 20 mg/dL   Creatinine, Ser 1.61 0.44 - 1.00 mg/dL   Calcium 9.7 8.9 - 09.6 mg/dL   Total Protein 7.2 6.5 - 8.1 g/dL   Albumin 3.3 (L) 3.5 - 5.0 g/dL   AST 9 (L) 15 - 41 U/L   ALT 10 0 - 44 U/L   Alkaline Phosphatase 73 38 - 126 U/L   Total Bilirubin 0.5 <1.2 mg/dL   GFR, Estimated >04 >54 mL/min    Comment: (NOTE) Calculated using the CKD-EPI Creatinine Equation (2021)    Anion gap 12 5 - 15    Comment: Performed at [facility], [address] [place], [place] [address]  CBC with Differential     Status: Abnormal   Collection Time:  01/20/23  4:33 PM  Result Value Ref Range   WBC 8.2 4.0 - 10.5 K/uL   RBC 3.84 (L) 3.87 - 5.11 MIL/uL   Hemoglobin 11.0 (L) 12.0 - 15.0 g/dL   HCT 91.4 (L) 78.2 - 95.6 %   MCV 87.5 80.0 - 100.0 fL   MCH 28.6 26.0 - 34.0 pg   MCHC 32.7 30.0 - 36.0 g/dL   RDW 21.3 08.6 - 57.8 %   Platelets 293 150 - 400 K/uL   nRBC 0.0 0.0 - 0.2 %   Neutrophils Relative % 59 %  Neutro Abs 4.9 1.7 - 7.7 K/uL   Lymphocytes Relative 31 %   Lymphs Abs 2.5 0.7 - 4.0 K/uL   Monocytes Relative 7 %   Monocytes Absolute 0.6 0.1 - 1.0 K/uL   Eosinophils Relative 2 %   Eosinophils Absolute 0.1 0.0 - 0.5 K/uL   Basophils Relative 1 %   Basophils Absolute 0.1 0.0 - 0.1 K/uL   Immature Granulocytes 0 %   Abs Immature Granulocytes 0.02 0.00 - 0.07 K/uL    Comment: Performed at [facility], [address], [place], [place] [address]  Protime-INR     Status: None   Collection Time: 01/20/23  4:33 PM  Result Value Ref Range   Prothrombin Time 14.0 11.4 - 15.2 seconds   INR 1.1 0.8 - 1.2    Comment: (NOTE) INR goal varies based on device and disease states. Performed at [facility], [address], [place], [place] [address]   APTT     Status: None   Collection Time: 01/20/23  4:33 PM  Result Value Ref Range   aPTT 31 24 - 36 seconds    Comment: Performed at [facility], [address], [place], [place] [address]  hCG, serum, qualitative     Status: None   Collection Time: 01/20/23  4:33 PM  Result Value Ref Range   Preg, Serum NEGATIVE NEGATIVE    Comment:        THE SENSITIVITY OF THIS METHODOLOGY IS >10 mIU/mL. Performed at [facility], [address], [place], [place] [address]   I-Stat Lactic Acid, ED     Status: Abnormal   Collection Time: 01/20/23  4:49 PM  Result Value Ref Range   Lactic Acid, Venous 0.3 (L) 0.5 - 1.9 mmol/L  I-Stat Lactic Acid, ED     Status: None   Collection Time: 01/20/23  9:44 PM  Result Value Ref Range   Lactic Acid, Venous 0.5 0.5 - 1.9  mmol/L   [place] CHEST SOFT TISSUE  Addendum Date: 01/20/2023   ADDENDUM REPORT: 01/20/2023 19:49 ADDENDUM: Findings discussed with Dr. Rhae Hammock. Electronically Signed   By: Layla Maw M.D.   On: 01/20/2023 19:49   Result Date: 01/20/2023 CLINICAL DATA:  Status post double mastectomy 12/23/2022. Left-sided pain, erythema and swelling. EXAM: Ultrasound of the left breast TECHNIQUE: Ultrasound examination of the left breast. COMPARISON:  None Available. FINDINGS: There is a complex subcutaneous fluid collection in the left breast measuring least 8 x 7 x 4 cm. There are internal septations which are avascular with Doppler imaging. Differential for this collection would include postop seroma versus abscess. Needle aspiration could be helpful for making this distinction. Comparison images of the right breast demonstrate no similar collection. IMPRESSION: Complex fluid collection in the left breast. Differential abscess versus postop seroma. Electronically Signed: By: Layla Maw M.D. On: 01/20/2023 19:42    Pending Labs Unresulted Labs (From admission, onward)     Start     Ordered   01/20/23 1623  Blood Culture (routine x 2)  (Undifferentiated presentation (screening labs and basic nursing orders))  BLOOD CULTURE X 2,   STAT      01/20/23 1623   Signed and Held  Basic metabolic panel  Daily,   R      Signed and Held   Signed and Held  CBC  Daily,   R      Signed and Held  Vitals/Pain Today's Vitals   01/20/23 2137 01/20/23 2138 01/20/23 2139 01/20/23 2145  BP:    (!) 145/93  Pulse:    92  Resp:    16  Temp:  99.1 F (37.3 C)    TempSrc:  Oral    SpO2:    100%  Weight:      Height:      PainSc: 7   7      Isolation Precautions No active isolations  Medications Medications  oxyCODONE (Oxy IR/ROXICODONE) immediate release tablet 5 mg (5 mg Oral Given 01/20/23 1634)  LORazepam (ATIVAN) tablet 1 mg (1 mg Oral Given 01/20/23 2044)    Mobility walks     Focused  Assessments     R Recommendations: See Admitting Provider Note  Report given to:   Additional Notes:

## 2023-01-21 ENCOUNTER — Encounter (HOSPITAL_COMMUNITY)
Admission: EM | Disposition: A | Discharge status: Home / Self Care | Payer: Self-pay | Source: Home / Self Care | Attending: General Surgery

## 2023-01-21 ENCOUNTER — Other Ambulatory Visit: Payer: Self-pay

## 2023-01-21 ENCOUNTER — Inpatient Hospital Stay (HOSPITAL_COMMUNITY): Payer: No Typology Code available for payment source | Admitting: Anesthesiology

## 2023-01-21 ENCOUNTER — Encounter (HOSPITAL_COMMUNITY): Payer: Self-pay | Admitting: General Surgery

## 2023-01-21 DIAGNOSIS — L7633 Postprocedural seroma of skin and subcutaneous tissue following a dermatologic procedure: Secondary | ICD-10-CM | POA: Diagnosis not present

## 2023-01-21 HISTORY — PX: EVACUATION BREAST HEMATOMA: SHX1537

## 2023-01-21 LAB — CBC
HCT: 30.4 % — ABNORMAL LOW (ref 36.0–46.0)
Hemoglobin: 10.2 g/dL — ABNORMAL LOW (ref 12.0–15.0)
MCH: 28.6 pg (ref 26.0–34.0)
MCHC: 33.6 g/dL (ref 30.0–36.0)
MCV: 85.2 fL (ref 80.0–100.0)
Platelets: 241 10*3/uL (ref 150–400)
RBC: 3.57 MIL/uL — ABNORMAL LOW (ref 3.87–5.11)
RDW: 12.6 % (ref 11.5–15.5)
WBC: 9.6 10*3/uL (ref 4.0–10.5)
nRBC: 0 % (ref 0.0–0.2)

## 2023-01-21 LAB — BASIC METABOLIC PANEL
Anion gap: 7 (ref 5–15)
BUN: <5 mg/dL — ABNORMAL LOW (ref 6–20)
CO2: 25 mmol/L (ref 22–32)
Calcium: 8.7 mg/dL — ABNORMAL LOW (ref 8.9–10.3)
Chloride: 102 mmol/L (ref 98–111)
Creatinine, Ser: 0.89 mg/dL (ref 0.44–1.00)
GFR, Estimated: >60 mL/min (ref >60)
Glucose, Bld: 105 mg/dL — ABNORMAL HIGH (ref 70–99)
Potassium: 3.3 mmol/L — ABNORMAL LOW (ref 3.5–5.1)
Sodium: 134 mmol/L — ABNORMAL LOW (ref 135–145)

## 2023-01-21 SURGERY — EVACUATION, HEMATOMA, BREAST
Anesthesia: General | Site: Chest | Laterality: Left

## 2023-01-21 MED ORDER — MIDAZOLAM HCL 2 MG/2ML IJ SOLN
INTRAMUSCULAR | Status: DC | PRN
  Administered 2023-01-21: 2 mg via INTRAVENOUS

## 2023-01-21 MED ORDER — MIDAZOLAM HCL 2 MG/2ML IJ SOLN
INTRAMUSCULAR | Status: AC
  Filled 2023-01-21: qty 2

## 2023-01-21 MED ORDER — FENTANYL CITRATE (PF) 100 MCG/2ML IJ SOLN
25.0000 ug | INTRAMUSCULAR | Status: DC | PRN
  Administered 2023-01-21: 100 ug via INTRAVENOUS
  Administered 2023-01-21 (×2): 50 ug via INTRAVENOUS

## 2023-01-21 MED ORDER — AMISULPRIDE (ANTIEMETIC) 5 MG/2ML IV SOLN
INTRAVENOUS | Status: AC
  Administered 2023-01-21: 10 mg via INTRAVENOUS
  Filled 2023-01-21: qty 4

## 2023-01-21 MED ORDER — OXYCODONE HCL 5 MG PO TABS: 5.0000 mg | ORAL_TABLET | ORAL | Status: DC | PRN

## 2023-01-21 MED ORDER — 0.9 % SODIUM CHLORIDE (POUR BTL) OPTIME
TOPICAL | Status: DC | PRN
  Administered 2023-01-21: 1000 mL

## 2023-01-21 MED ORDER — OXYCODONE-ACETAMINOPHEN 5-325 MG PO TABS: 2.0000 | ORAL_TABLET | Freq: Once | ORAL | Status: AC

## 2023-01-21 MED ORDER — FENTANYL CITRATE (PF) 250 MCG/5ML IJ SOLN
INTRAMUSCULAR | Status: AC
  Filled 2023-01-21: qty 5

## 2023-01-21 MED ORDER — DEXAMETHASONE SODIUM PHOSPHATE 10 MG/ML IJ SOLN
INTRAMUSCULAR | Status: DC | PRN
  Administered 2023-01-21: 10 mg via INTRAVENOUS

## 2023-01-21 MED ORDER — PROPOFOL 10 MG/ML IV BOLUS
INTRAVENOUS | Status: AC
  Filled 2023-01-21: qty 20

## 2023-01-21 MED ORDER — HYDROMORPHONE HCL 1 MG/ML IJ SOLN
1.0000 mg | INTRAMUSCULAR | Status: DC | PRN
  Administered 2023-01-21 – 2023-01-25 (×17): 1 mg via INTRAVENOUS
  Filled 2023-01-21 (×18): qty 1

## 2023-01-21 MED ORDER — CHLORHEXIDINE GLUCONATE 0.12 % MT SOLN
OROMUCOSAL | Status: AC
  Administered 2023-01-21: 15 mL via OROMUCOSAL
  Filled 2023-01-21: qty 15

## 2023-01-21 MED ORDER — AMISULPRIDE (ANTIEMETIC) 5 MG/2ML IV SOLN: 10.0000 mg | Freq: Once | INTRAVENOUS | Status: AC | PRN

## 2023-01-21 MED ORDER — DEXMEDETOMIDINE HCL IN NACL 80 MCG/20ML IV SOLN
INTRAVENOUS | Status: DC | PRN
  Administered 2023-01-21: 4 ug via INTRAVENOUS
  Administered 2023-01-21: 8 ug via INTRAVENOUS

## 2023-01-21 MED ORDER — LACTATED RINGERS IV SOLN: INTRAVENOUS | Status: DC

## 2023-01-21 MED ORDER — LIDOCAINE 2% (20 MG/ML) 5 ML SYRINGE
INTRAMUSCULAR | Status: DC | PRN
  Administered 2023-01-21: 80 mg via INTRAVENOUS

## 2023-01-21 MED ORDER — OXYCODONE HCL 5 MG/5ML PO SOLN
5.0000 mg | Freq: Once | ORAL | Status: DC | PRN
Start: 2023-01-21 — End: 2023-01-21

## 2023-01-21 MED ORDER — ESCITALOPRAM OXALATE 20 MG PO TABS
20.0000 mg | ORAL_TABLET | Freq: Every day | ORAL | Status: DC
  Administered 2023-01-21 – 2023-01-24 (×4): 20 mg via ORAL
  Filled 2023-01-21 (×4): qty 1

## 2023-01-21 MED ORDER — OXYCODONE HCL 5 MG PO TABS: 5.0000 mg | ORAL_TABLET | Freq: Once | ORAL | Status: DC | PRN

## 2023-01-21 MED ORDER — CHLORHEXIDINE GLUCONATE 0.12 % MT SOLN: 15.0000 mL | Freq: Once | OROMUCOSAL | Status: AC

## 2023-01-21 MED ORDER — OXYCODONE-ACETAMINOPHEN 5-325 MG PO TABS
ORAL_TABLET | ORAL | Status: AC
  Administered 2023-01-21: 2 via ORAL
  Filled 2023-01-21: qty 2

## 2023-01-21 MED ORDER — ACETAMINOPHEN 500 MG PO TABS: 1000.0000 mg | ORAL_TABLET | Freq: Once | ORAL | Status: AC

## 2023-01-21 MED ORDER — ACETAMINOPHEN 500 MG PO TABS
ORAL_TABLET | ORAL | Status: AC
  Administered 2023-01-21: 1000 mg via ORAL
  Filled 2023-01-21: qty 2

## 2023-01-21 MED ORDER — ONDANSETRON HCL 4 MG/2ML IJ SOLN
INTRAMUSCULAR | Status: DC | PRN
  Administered 2023-01-21: 4 mg via INTRAVENOUS

## 2023-01-21 MED ORDER — OXYCODONE HCL 5 MG PO TABS
ORAL_TABLET | ORAL | Status: AC
  Filled 2023-01-21: qty 1

## 2023-01-21 MED ORDER — FENTANYL CITRATE (PF) 100 MCG/2ML IJ SOLN
INTRAMUSCULAR | Status: AC
  Administered 2023-01-21: 50 ug via INTRAVENOUS
  Filled 2023-01-21: qty 2

## 2023-01-21 MED ORDER — OXYCODONE HCL 5 MG PO TABS
5.0000 mg | ORAL_TABLET | Freq: Four times a day (QID) | ORAL | Status: DC
  Administered 2023-01-21 (×2): 5 mg via ORAL
  Filled 2023-01-21 (×2): qty 1

## 2023-01-21 MED ORDER — LORAZEPAM 2 MG/ML IJ SOLN
INTRAMUSCULAR | Status: AC
  Administered 2023-01-21: 1 mg via INTRAVENOUS
  Filled 2023-01-21: qty 1

## 2023-01-21 MED ORDER — LORAZEPAM 2 MG/ML IJ SOLN
1.0000 mg | INTRAMUSCULAR | Status: DC | PRN
  Administered 2023-01-21 – 2023-01-25 (×9): 1 mg via INTRAVENOUS
  Filled 2023-01-21 (×9): qty 1

## 2023-01-21 MED ORDER — BUPIVACAINE-EPINEPHRINE (PF) 0.25% -1:200000 IJ SOLN
INTRAMUSCULAR | Status: DC | PRN
Start: 2023-01-21 — End: 2023-01-21
  Administered 2023-01-21: 20 mL

## 2023-01-21 MED ORDER — OXYCODONE HCL 5 MG PO TABS
10.0000 mg | ORAL_TABLET | ORAL | Status: DC | PRN
  Administered 2023-01-21 – 2023-01-25 (×14): 10 mg via ORAL
  Filled 2023-01-21 (×15): qty 2

## 2023-01-21 MED ORDER — OXYCODONE-ACETAMINOPHEN 5-325 MG PO TABS
1.0000 | ORAL_TABLET | Freq: Four times a day (QID) | ORAL | Status: DC
  Administered 2023-01-21 (×2): 1 via ORAL
  Filled 2023-01-21 (×2): qty 1

## 2023-01-21 MED ORDER — PROPOFOL 10 MG/ML IV BOLUS
INTRAVENOUS | Status: DC | PRN
  Administered 2023-01-21: 200 mg via INTRAVENOUS

## 2023-01-21 MED ORDER — PHENYLEPHRINE 80 MCG/ML (10ML) SYRINGE FOR IV PUSH (FOR BLOOD PRESSURE SUPPORT)
PREFILLED_SYRINGE | INTRAVENOUS | Status: DC | PRN
  Administered 2023-01-21: 80 ug via INTRAVENOUS
  Administered 2023-01-21: 160 ug via INTRAVENOUS

## 2023-01-21 MED ORDER — ORAL CARE MOUTH RINSE: 15.0000 mL | Freq: Once | OROMUCOSAL | Status: AC

## 2023-01-21 MED ORDER — BUPIVACAINE-EPINEPHRINE (PF) 0.25% -1:200000 IJ SOLN
INTRAMUSCULAR | Status: AC
  Filled 2023-01-21: qty 30

## 2023-01-21 SURGICAL SUPPLY — 35 items
BAG COUNTER SPONGE SURGICOUNT (BAG) ×1 IMPLANT
BINDER BREAST LRG (GAUZE/BANDAGES/DRESSINGS) IMPLANT
BIOPATCH RED 1 DISK 7.0 (GAUZE/BANDAGES/DRESSINGS) IMPLANT
BLADE CLIPPER SURG (BLADE) IMPLANT
BNDG GAUZE DERMACEA FLUFF 4 (GAUZE/BANDAGES/DRESSINGS) IMPLANT
CANISTER SUCT 3000ML PPV (MISCELLANEOUS) ×1 IMPLANT
COVER SURGICAL LIGHT HANDLE (MISCELLANEOUS) ×1 IMPLANT
DERMABOND ADVANCED .7 DNX12 (GAUZE/BANDAGES/DRESSINGS) IMPLANT
DRAIN CHANNEL 19F RND (DRAIN) IMPLANT
DRSG TEGADERM 4X4.75 (GAUZE/BANDAGES/DRESSINGS) IMPLANT
ELECT CAUTERY BLADE 6.4 (BLADE) IMPLANT
ELECT REM PT RETURN 9FT ADLT (ELECTROSURGICAL) ×1
ELECTRODE REM PT RTRN 9FT ADLT (ELECTROSURGICAL) ×1 IMPLANT
EVACUATOR SILICONE 100CC (DRAIN) IMPLANT
GAUZE PAD ABD 8X10 STRL (GAUZE/BANDAGES/DRESSINGS) IMPLANT
GAUZE SPONGE 4X4 12PLY STRL (GAUZE/BANDAGES/DRESSINGS) IMPLANT
GLOVE BIO SURGEON STRL SZ7 (GLOVE) ×1 IMPLANT
GLOVE BIOGEL PI IND STRL 7.5 (GLOVE) ×1 IMPLANT
GOWN STRL REUS W/ TWL LRG LVL3 (GOWN DISPOSABLE) ×2 IMPLANT
KIT BASIN OR (CUSTOM PROCEDURE TRAY) ×1 IMPLANT
KIT TURNOVER KIT B (KITS) ×1 IMPLANT
NDL HYPO 25GX1X1/2 BEV (NEEDLE) IMPLANT
NEEDLE HYPO 25GX1X1/2 BEV (NEEDLE) ×1 IMPLANT
NS IRRIG 1000ML POUR BTL (IV SOLUTION) ×1 IMPLANT
PACK GENERAL/GYN (CUSTOM PROCEDURE TRAY) ×1 IMPLANT
PAD ARMBOARD 7.5X6 YLW CONV (MISCELLANEOUS) ×1 IMPLANT
PENCIL SMOKE EVACUATOR (MISCELLANEOUS) ×1 IMPLANT
SUT ETHILON 2 0 FS 18 (SUTURE) IMPLANT
SUT MNCRL AB 4-0 PS2 18 (SUTURE) IMPLANT
SUT VIC AB 3-0 SH 27X BRD (SUTURE) IMPLANT
SWAB COLLECTION DEVICE MRSA (MISCELLANEOUS) IMPLANT
SWAB CULTURE ESWAB REG 1ML (MISCELLANEOUS) IMPLANT
SYR CONTROL 10ML LL (SYRINGE) IMPLANT
TOWEL GREEN STERILE (TOWEL DISPOSABLE) ×1 IMPLANT
TOWEL GREEN STERILE FF (TOWEL DISPOSABLE) ×1 IMPLANT

## 2023-01-21 NOTE — Op Note (Signed)
Pre-op diagnosis: Recurrent seroma left chest wall Postop diagnosis: Same Procedure performed: Evacuation of left chest wall seroma with placement of drain Surgeon:Sonia Bromell K Shenica Holzheimer Anesthesia: General Indications: This is a 37 year old female with multiple medical issues who presented after bilateral mastectomies on 12/23/2022 with immediate reconstruction.  She had intractable pain and underwent removal of the tissue expanders on 12/25/2022.  Her drains were removed on 01/10/2023.  She has developed swelling and increasing pain in her left chest.  She returned to the emergency department.  She was found to have a recurrent seroma.  No sign of infection.  Description of procedure: The patient is brought to the operating room placed in the supine position on the operating room table.  After an adequate level of general anesthesia was obtained, her left chest was prepped with ChloraPrep and draped sterile fashion.  A timeout was taken to ensure the proper patient and proper procedure.  We infiltrated the area around the lateral 3 cm of her mastectomy incision with local anesthetic.  I opened the incision with a scalpel.  We dissected down through the subcutaneous tissue until we encountered the seroma cavity.  I inserted the suction cannula.  We evacuated approximately 150 cc of cloudy yellow fluid.  This did not appear to be purulent.  There is complete decompression of the cavity.  We irrigated this thoroughly and inspected for hemostasis.  I put local anesthetic directly into the pectoralis muscle to aid in postoperative pain control.  A 19 French drain was brought in through a lateral stab incision.  We placed this across the entire seroma cavity.  This was secured to the skin with 2-0 Ethilon.  We then closed the wound with a deep layer of 3-0 Vicryl and a subcuticular 4-0 Monocryl.  Dermabond was applied.  The drain was placed to bulb suction.  A dressing was placed around the drain.  The patient was then  extubated and brought to recovery room in stable condition.  All sponge, instrument, and needle counts are correct.  Wilmon Arms. Corliss Skains, MD, [facility] Surgery  General Surgery   01/21/2023 11:48 AM

## 2023-01-21 NOTE — Progress Notes (Signed)
Subjective/Chief Complaint: Patient is extremely anxious and tearful Complaining of bilateral chest wall pain - left greater than right Afebrile   Objective: Vital signs in last 24 hours: Temp:  [98.1 F (36.7 C)-99.5 F (37.5 C)] 99.5 F (37.5 C) (12/07 0857) Pulse Rate:  [75-98] 95 (12/07 0857) Resp:  [16-22] 19 (12/07 0857) BP: (127-183)/(75-104) 127/78 (12/07 0857) SpO2:  [98 %-100 %] 98 % (12/07 0857) Weight:  [90.7 kg] 90.7 kg (12/06 2122)    Intake/Output from previous day: No intake/output data recorded. Intake/Output this shift: No intake/output data recorded.  Right mastectomy incision - clean, dry, intact; no sign of infection; small amount of seroma Left mastectomy incision - clean, dry, intact; no infection; large recurrent seroma  Lab Results:  Recent Labs    01/20/23 1633 01/21/23 0424  WBC 8.2 9.6  HGB 11.0* 10.2*  HCT 33.6* 30.4*  PLT 293 241   BMET Recent Labs    01/20/23 1633 01/21/23 0424  NA 137 134*  K 3.7 3.3*  CL 102 102  CO2 23 25  GLUCOSE 104* 105*  BUN 8 <5*  CREATININE 0.84 0.89  CALCIUM 9.7 8.7*   PT/INR Recent Labs    01/20/23 1633  LABPROT 14.0  INR 1.1   ABG No results for input(s): "PHART", "HCO3" in the last 72 hours.  Invalid input(s): "PCO2", "PO2"  Studies/Results: [place] CHEST SOFT TISSUE  Addendum Date: 01/20/2023   ADDENDUM REPORT: 01/20/2023 19:49 ADDENDUM: Findings discussed with Dr. Rhae Hammock. Electronically Signed   By: Layla Maw M.D.   On: 01/20/2023 19:49   Result Date: 01/20/2023 CLINICAL DATA:  Status post double mastectomy 12/23/2022. Left-sided pain, erythema and swelling. EXAM: Ultrasound of the left breast TECHNIQUE: Ultrasound examination of the left breast. COMPARISON:  None Available. FINDINGS: There is a complex subcutaneous fluid collection in the left breast measuring least 8 x 7 x 4 cm. There are internal septations which are avascular with Doppler imaging. Differential for this  collection would include postop seroma versus abscess. Needle aspiration could be helpful for making this distinction. Comparison images of the right breast demonstrate no similar collection. IMPRESSION: Complex fluid collection in the left breast. Differential abscess versus postop seroma. Electronically Signed: By: Layla Maw M.D. On: 01/20/2023 19:42    Anti-infectives: Anti-infectives (From admission, onward)    Start     Dose/Rate Route Frequency Ordered Stop   01/20/23 2330  vancomycin (VANCOCIN) IVPB 1000 mg/200 mL premix       Placed in "Followed by" Linked Group   1,000 mg 200 mL/hr over 60 Minutes Intravenous  Once 01/20/23 2218 01/21/23 0232   01/20/23 2230  vancomycin (VANCOCIN) IVPB 1000 mg/200 mL premix       Placed in "Followed by" Linked Group   1,000 mg 200 mL/hr over 60 Minutes Intravenous  Once 01/20/23 2218 01/21/23 0126       Assessment/Plan: s/p bilateral mastectomies for pain and persistent drainage by Dr. Carolynne Edouard 12/23/22; removal of tissue expanders by Dr. Ladona Ridgel 12/25/22.    Drains removed 01/10/23 in Plastics clinic Now with recurrent seroma and worsening pain in left chest.  Patient too anxious to allow bedside aspiration of seroma, so we will proceed to OR for evacuation of left chest wall seroma and replacement of JP drain.  The surgical procedure has been discussed with the patient.  Potential risks, benefits, alternative treatments, and expected outcomes have been explained.  All of the patient's questions at this time have been answered.  The likelihood  of reaching the patient's treatment goal is good.  The patient understand the proposed surgical procedure and wishes to proceed.   LOS: 1 day    Wynona Luna 01/21/2023

## 2023-01-21 NOTE — Anesthesia Procedure Notes (Signed)
Procedure Name: LMA Insertion Date/Time: 01/21/2023 11:14 AM  Performed by: Orlin Hilding, CRNAPre-anesthesia Checklist: Patient identified, Emergency Drugs available, Suction available, Timeout performed and Patient being monitored Patient Re-evaluated:Patient Re-evaluated prior to induction Oxygen Delivery Method: Circle system utilized Preoxygenation: Pre-oxygenation with 100% oxygen Induction Type: IV induction LMA: LMA inserted LMA Size: 4.0 Number of attempts: 1 Placement Confirmation: positive ETCO2 and breath sounds checked- equal and bilateral Tube secured with: Tape

## 2023-01-21 NOTE — Anesthesia Postprocedure Evaluation (Signed)
Anesthesia Post Note  Patient: Leah Shea  Procedure(s) Performed: EVACUATION OF LEFT CHEST WALL SEROMA WITH PLACEMENT OF DRAIN (Left: Chest)     Patient location during evaluation: PACU Anesthesia Type: General Level of consciousness: awake and alert Pain management: pain level controlled Vital Signs Assessment: post-procedure vital signs reviewed and stable Respiratory status: spontaneous breathing, nonlabored ventilation and respiratory function stable Cardiovascular status: stable and blood pressure returned to baseline Anesthetic complications: no   No notable events documented.  Last Vitals:  Vitals:   01/21/23 1326 01/21/23 1623  BP: 128/69 (!) 138/98  Pulse: 88 84  Resp: 16 18  Temp: 36.9 C 36.7 C  SpO2: 97% 99%                    Beryle Lathe

## 2023-01-21 NOTE — Anesthesia Preprocedure Evaluation (Addendum)
Anesthesia Evaluation  Patient identified by MRN, date of birth, ID band Patient awake    Reviewed: Allergy & Precautions, NPO status , Patient's Chart, lab work & pertinent test results  History of Anesthesia Complications Negative for: history of anesthetic complications  Airway Mallampati: III  TM Distance: >3 FB Neck ROM: Full    Dental  (+) Dental Advisory Given   Pulmonary asthma , sleep apnea , former smoker   Pulmonary exam normal        Cardiovascular hypertension, Normal cardiovascular exam     Neuro/Psych  Headaches, Seizures -, Well Controlled,  PSYCHIATRIC DISORDERS Anxiety Depression       GI/Hepatic Neg liver ROS,GERD  Medicated and Controlled,,  Endo/Other  diabetes   Obesity   Renal/GU negative Renal ROS     Musculoskeletal negative musculoskeletal ROS (+)    Abdominal   Peds  Hematology  (+) Blood dyscrasia, Sickle cell trait   Anesthesia Other Findings   Reproductive/Obstetrics  Hx gDM                              Anesthesia Physical Anesthesia Plan  ASA: 3  Anesthesia Plan: General   Post-op Pain Management: Tylenol PO (pre-op)*   Induction: Intravenous  PONV Risk Score and Plan: 3 and Treatment may vary due to age or medical condition, Ondansetron, Dexamethasone and Midazolam  Airway Management Planned: LMA  Additional Equipment: None  Intra-op Plan:   Post-operative Plan: Extubation in OR  Informed Consent: I have reviewed the patients History and Physical, chart, labs and discussed the procedure including the risks, benefits and alternatives for the proposed anesthesia with the patient or authorized representative who has indicated his/her understanding and acceptance.     Dental advisory given  Plan Discussed with: CRNA, Anesthesiologist and Surgeon  Anesthesia Plan Comments:        Anesthesia Quick Evaluation

## 2023-01-21 NOTE — Transfer of Care (Signed)
Immediate Anesthesia Transfer of Care Note  Patient: Leah Shea  Procedure(s) Performed: EVACUATION OF LEFT CHEST WALL SEROMA WITH PLACEMENT OF DRAIN (Left: Chest)  Patient Location: PACU  Anesthesia Type:General  Level of Consciousness: drowsy and patient cooperative  Airway & Oxygen Therapy: Patient Spontanous Breathing and Patient connected to face mask oxygen  Post-op Assessment: Report given to RN and Post -op Vital signs reviewed and stable  Post vital signs: Reviewed and stable  Last Vitals:  Vitals Value Taken Time  BP 141/79 01/21/23 1156  Temp 36.7 C 01/21/23 1156  Pulse 97 01/21/23 1158  Resp 13 01/21/23 1158  SpO2 96 % 01/21/23 1158  Vitals shown include unfiled device data.  Last Pain:  Vitals:   01/21/23 1036  TempSrc: Oral  PainSc: 9          Complications: No notable events documented.

## 2023-01-22 ENCOUNTER — Encounter (HOSPITAL_COMMUNITY): Payer: Self-pay | Admitting: Surgery

## 2023-01-22 LAB — BASIC METABOLIC PANEL
Anion gap: 9 (ref 5–15)
BUN: 9 mg/dL (ref 6–20)
CO2: 24 mmol/L (ref 22–32)
Calcium: 9.2 mg/dL (ref 8.9–10.3)
Chloride: 105 mmol/L (ref 98–111)
Creatinine, Ser: 1.08 mg/dL — ABNORMAL HIGH (ref 0.44–1.00)
GFR, Estimated: >60 mL/min (ref >60)
Glucose, Bld: 144 mg/dL — ABNORMAL HIGH (ref 70–99)
Potassium: 3.6 mmol/L (ref 3.5–5.1)
Sodium: 138 mmol/L (ref 135–145)

## 2023-01-22 LAB — CBC
HCT: 30 % — ABNORMAL LOW (ref 36.0–46.0)
Hemoglobin: 10.1 g/dL — ABNORMAL LOW (ref 12.0–15.0)
MCH: 28.9 pg (ref 26.0–34.0)
MCHC: 33.7 g/dL (ref 30.0–36.0)
MCV: 86 fL (ref 80.0–100.0)
Platelets: 247 10*3/uL (ref 150–400)
RBC: 3.49 MIL/uL — ABNORMAL LOW (ref 3.87–5.11)
RDW: 12.5 % (ref 11.5–15.5)
WBC: 13.3 10*3/uL — ABNORMAL HIGH (ref 4.0–10.5)
nRBC: 0 % (ref 0.0–0.2)

## 2023-01-22 MED ORDER — PROMETHAZINE HCL 25 MG PO TABS
25.0000 mg | ORAL_TABLET | Freq: Four times a day (QID) | ORAL | Status: DC | PRN
  Administered 2023-01-22: 25 mg via ORAL
  Filled 2023-01-22: qty 1

## 2023-01-22 NOTE — Plan of Care (Signed)
  Problem: Nutrition: Goal: Adequate nutrition will be maintained Outcome: Progressing   Problem: Pain Management: Goal: General experience of comfort will improve Outcome: Progressing   Problem: Safety: Goal: Ability to remain free from injury will improve Outcome: Progressing   Problem: Skin Integrity: Goal: Risk for impaired skin integrity will decrease Outcome: Progressing

## 2023-01-22 NOTE — Plan of Care (Signed)

## 2023-01-22 NOTE — Progress Notes (Signed)
1 Day Post-Op   Subjective/Chief Complaint: Patient remains very tearful and anxious Still complaining of left chest pain EKG - NSR    Objective: Vital signs in last 24 hours: Temp:  [97.7 F (36.5 C)-98.6 F (37 C)] 98.6 F (37 C) (12/08 0743) Pulse Rate:  [84-95] 91 (12/08 0743) Resp:  [13-18] 16 (12/08 0743) BP: (114-153)/(66-98) 151/90 (12/08 0743) SpO2:  [92 %-100 %] 100 % (12/08 0743)    Intake/Output from previous day: 12/07 0701 - 12/08 0700 In: 100 [P.O.:100] Out: 110 [Drains:110] Intake/Output this shift: No intake/output data recorded.  Left mastectomy incision - intact with no drainage Drain - thin clear serous output  Lab Results:  Recent Labs    01/21/23 0424 01/22/23 0702  WBC 9.6 13.3*  HGB 10.2* 10.1*  HCT 30.4* 30.0*  PLT 241 247   BMET Recent Labs    01/21/23 0424 01/22/23 0702  NA 134* 138  K 3.3* 3.6  CL 102 105  CO2 25 24  GLUCOSE 105* 144*  BUN <5* 9  CREATININE 0.89 1.08*  CALCIUM 8.7* 9.2   PT/INR Recent Labs    01/20/23 1633  LABPROT 14.0  INR 1.1   ABG No results for input(s): "PHART", "HCO3" in the last 72 hours.  Invalid input(s): "PCO2", "PO2"  Studies/Results: [place] CHEST SOFT TISSUE  Addendum Date: 01/20/2023   ADDENDUM REPORT: 01/20/2023 19:49 ADDENDUM: Findings discussed with Dr. Rhae Hammock. Electronically Signed   By: Layla Maw M.D.   On: 01/20/2023 19:49   Result Date: 01/20/2023 CLINICAL DATA:  Status post double mastectomy 12/23/2022. Left-sided pain, erythema and swelling. EXAM: Ultrasound of the left breast TECHNIQUE: Ultrasound examination of the left breast. COMPARISON:  None Available. FINDINGS: There is a complex subcutaneous fluid collection in the left breast measuring least 8 x 7 x 4 cm. There are internal septations which are avascular with Doppler imaging. Differential for this collection would include postop seroma versus abscess. Needle aspiration could be helpful for making this distinction.  Comparison images of the right breast demonstrate no similar collection. IMPRESSION: Complex fluid collection in the left breast. Differential abscess versus postop seroma. Electronically Signed: By: Layla Maw M.D. On: 01/20/2023 19:42    Anti-infectives: Anti-infectives (From admission, onward)    Start     Dose/Rate Route Frequency Ordered Stop   01/20/23 2330  vancomycin (VANCOCIN) IVPB 1000 mg/200 mL premix       Placed in "Followed by" Linked Group   1,000 mg 200 mL/hr over 60 Minutes Intravenous  Once 01/20/23 2218 01/21/23 0232   01/20/23 2230  vancomycin (VANCOCIN) IVPB 1000 mg/200 mL premix       Placed in "Followed by" Linked Group   1,000 mg 200 mL/hr over 60 Minutes Intravenous  Once 01/20/23 2218 01/21/23 0126       Assessment/Plan: s/p bilateral mastectomies for pain and persistent drainage by Dr. Carolynne Edouard 12/23/22; removal of tissue expanders by Dr. Ladona Ridgel 12/25/22.     Drains removed 01/10/23 in Plastics clinic Now with recurrent seroma and worsening pain in left chest.  Patient too anxious to allow bedside aspiration of seroma, so we will proceed to OR for evacuation of left chest wall seroma and replacement of JP drain.   S/p evacuation of recurrent seroma/ replacement of left chest JP drain - 01/21/23 - Leah Shea  Patient continues to have significant pain control and anxiety issues On her home meds PRN Oxycodone 10 mg Hopefully home tomorrow.  LOS: 2 days    Wilmon Arms  Jerrold Haskell 01/22/2023

## 2023-01-22 NOTE — Progress Notes (Signed)
patient c/o of chest pain, left side chest pain, stated it feels like pressure on the left side. she was medicated with IV dilaudid, the pain on her chest subsided a little. Educated  that the pressure can be related d/t the evacuation she had on the left breast,  EKG was done  to make sure it wasn't cardiac related. EKG show NSR with ventricular rate 92. Dr. Violeta Gelinas was notified. He will be rounding soon to see the patient.

## 2023-01-23 ENCOUNTER — Inpatient Hospital Stay (HOSPITAL_COMMUNITY): Payer: No Typology Code available for payment source

## 2023-01-23 LAB — CBC
HCT: 30.5 % — ABNORMAL LOW (ref 36.0–46.0)
Hemoglobin: 9.9 g/dL — ABNORMAL LOW (ref 12.0–15.0)
MCH: 28.5 pg (ref 26.0–34.0)
MCHC: 32.5 g/dL (ref 30.0–36.0)
MCV: 87.9 fL (ref 80.0–100.0)
Platelets: 281 10*3/uL (ref 150–400)
RBC: 3.47 MIL/uL — ABNORMAL LOW (ref 3.87–5.11)
RDW: 12.8 % (ref 11.5–15.5)
WBC: 7.3 10*3/uL (ref 4.0–10.5)
nRBC: 0 % (ref 0.0–0.2)

## 2023-01-23 LAB — TROPONIN I (HIGH SENSITIVITY): Troponin I (High Sensitivity): 4 ng/L (ref <18)

## 2023-01-23 NOTE — Plan of Care (Signed)

## 2023-01-23 NOTE — Progress Notes (Signed)
2 Days Post-Op   Subjective/Chief Complaint: Complains of chest pain. Not drinking or eating   Objective: Vital signs in last 24 hours: Temp:  [97.6 F (36.4 C)-98.2 F (36.8 C)] 97.6 F (36.4 C) (12/09 0402) Pulse Rate:  [90-93] 90 (12/09 0402) Resp:  [18] 18 (12/09 0402) BP: (130)/(78-88) 130/78 (12/09 0402) SpO2:  [97 %-98 %] 98 % (12/09 0402)    Intake/Output from previous day: 12/08 0701 - 12/09 0700 In: 720 [P.O.:720] Out: 35 [Drains:35] Intake/Output this shift: Total I/O In: -  Out: 18 [Drains:18]  General appearance: alert and cooperative Resp: clear to auscultation bilaterally Chest wall: skin flaps look ok Cardio: regular rate and rhythm GI: soft, nontender  Lab Results:  Recent Labs    01/21/23 0424 01/22/23 0702  WBC 9.6 13.3*  HGB 10.2* 10.1*  HCT 30.4* 30.0*  PLT 241 247   BMET Recent Labs    01/21/23 0424 01/22/23 0702  NA 134* 138  K 3.3* 3.6  CL 102 105  CO2 25 24  GLUCOSE 105* 144*  BUN <5* 9  CREATININE 0.89 1.08*  CALCIUM 8.7* 9.2   PT/INR Recent Labs    01/20/23 1633  LABPROT 14.0  INR 1.1   ABG No results for input(s): "PHART", "HCO3" in the last 72 hours.  Invalid input(s): "PCO2", "PO2"  Studies/Results: No results found.  Anti-infectives: Anti-infectives (From admission, onward)    Start     Dose/Rate Route Frequency Ordered Stop   01/20/23 2330  vancomycin (VANCOCIN) IVPB 1000 mg/200 mL premix       Placed in "Followed by" Linked Group   1,000 mg 200 mL/hr over 60 Minutes Intravenous  Once 01/20/23 2218 01/21/23 0232   01/20/23 2230  vancomycin (VANCOCIN) IVPB 1000 mg/200 mL premix       Placed in "Followed by" Linked Group   1,000 mg 200 mL/hr over 60 Minutes Intravenous  Once 01/20/23 2218 01/21/23 0126       Assessment/Plan: s/p Procedure(s): EVACUATION OF LEFT CHEST WALL SEROMA WITH PLACEMENT OF DRAIN (Left) Advance diet. Will try to get upper GI EKG for chest pain and troponin CXR for chest  pain Continue xanax for anxiety Drain output serous. Will leave in at least 2 weeks  LOS: 3 days    Chevis Pretty III 01/23/2023

## 2023-01-23 NOTE — Progress Notes (Signed)
   01/23/23 0918  Mobility  Activity Ambulated with assistance in room  Level of Assistance Standby assist, set-up cues, supervision of patient - no hands on  Assistive Device None  Distance Ambulated (ft) 10 ft  Activity Response Tolerated fair  Mobility Referral Yes  Mobility visit 1 Mobility  Mobility Specialist Start Time (ACUTE ONLY) 0857  Mobility Specialist Stop Time (ACUTE ONLY) [id number]  Mobility Specialist Time Calculation (min) (ACUTE ONLY) 21 min   Mobility Specialist: Progress Note  Pt agreeable to mobility session - received in bed. Required SB using no AD, pt with very slow gait. C/o ongoing chest pain rated 8/10, nausea and dizziness at BOS. Returned to bed with all needs met - call bell within reach.   Barnie Mort, BS Mobility Specialist Please contact via SecureChat or Rehab office at [phone].

## 2023-01-24 ENCOUNTER — Encounter: Payer: No Typology Code available for payment source | Admitting: Surgical

## 2023-01-24 MED ORDER — METHOCARBAMOL 500 MG PO TABS
500.0000 mg | ORAL_TABLET | Freq: Four times a day (QID) | ORAL | Status: DC
  Administered 2023-01-24 – 2023-01-25 (×4): 500 mg via ORAL
  Filled 2023-01-24 (×4): qty 1

## 2023-01-24 MED ORDER — GABAPENTIN 100 MG PO CAPS
100.0000 mg | ORAL_CAPSULE | Freq: Three times a day (TID) | ORAL | Status: DC
  Administered 2023-01-24 – 2023-01-25 (×3): 100 mg via ORAL
  Filled 2023-01-24 (×3): qty 1

## 2023-01-24 MED ORDER — POLYETHYLENE GLYCOL 3350 17 G PO PACK
17.0000 g | PACK | Freq: Every day | ORAL | Status: DC
  Administered 2023-01-24 – 2023-01-25 (×2): 17 g via ORAL
  Filled 2023-01-24 (×2): qty 1

## 2023-01-24 NOTE — Progress Notes (Signed)
   01/24/23 1509  TOC Brief Assessment  Insurance and Status Reviewed  Patient has primary care physician Yes  Home environment has been reviewed safe to discharge to home when medically stable, has significant other  Prior level of function: independent  Prior/Current Home Services No current home services  Social Determinants of Health Reivew SDOH reviewed no interventions necessary  Readmission risk has been reviewed Yes  Transition of care needs no transition of care needs at this time     Transition of Care Department [facility]) has reviewed patient and no TOC needs have been identified at this time. We will continue to monitor patient advancement through interdisciplinary progression rounds. If new patient transition needs arise, please place a TOC consult.

## 2023-01-24 NOTE — Progress Notes (Signed)
3 Days Post-Op   Subjective/Chief Complaint: She thinks she feels a little better today   Objective: Vital signs in last 24 hours: Temp:  [98.4 F (36.9 C)-98.6 F (37 C)] 98.5 F (36.9 C) (12/10 0553) Pulse Rate:  [85-95] 85 (12/10 0553) Resp:  [18] 18 (12/09 0859) BP: (138-155)/(84-94) 138/88 (12/10 0553) SpO2:  [98 %-100 %] 98 % (12/10 0553)    Intake/Output from previous day: 12/09 0701 - 12/10 0700 In: -  Out: 18 [Drains:18] Intake/Output this shift: No intake/output data recorded.  General appearance: alert and cooperative Resp: clear to auscultation bilaterally Chest wall: skin flaps viable Cardio: regular rate and rhythm GI: soft, non-tender; bowel sounds normal; no masses,  no organomegaly  Lab Results:  Recent Labs    01/22/23 0702 01/23/23 0922  WBC 13.3* 7.3  HGB 10.1* 9.9*  HCT 30.0* 30.5*  PLT 247 281   BMET Recent Labs    01/22/23 0702  NA 138  K 3.6  CL 105  CO2 24  GLUCOSE 144*  BUN 9  CREATININE 1.08*  CALCIUM 9.2   PT/INR No results for input(s): "LABPROT", "INR" in the last 72 hours. ABG No results for input(s): "PHART", "HCO3" in the last 72 hours.  Invalid input(s): "PCO2", "PO2"  Studies/Results: DG Chest 2 View  Result Date: 01/23/2023 CLINICAL DATA:  [id number] Chest pain on respiration [id number] EXAM: CHEST - 2 VIEW COMPARISON:  12/31/2022 chest radiograph. FINDINGS: Stable cardiomediastinal silhouette with normal heart size. No pneumothorax. No pleural effusion. Lungs appear clear, with no acute consolidative airspace disease and no pulmonary edema. Oral contrast seen in gastric fundus. A surgical drain overlies the lateral lower left chest wall. Surgical clips overlie the lateral breasts bilaterally. IMPRESSION: No active cardiopulmonary disease. Electronically Signed   By: Delbert Phenix M.D.   On: 01/23/2023 17:27   DG UGI W SINGLE CM (SOL OR THIN BA)  Result Date: 01/23/2023 CLINICAL DATA:  [facility] medical history including  bilateral mastectomies December 23, 2022 with immediate reconstruction. And recurrent left chest wall seroma status post evacuation on January 21, 2023. Now with chest pain and vomitin. Team requesting an upper GI for further evaluation. EXAM: DG UGI W SINGLE CM TECHNIQUE: Scout radiograph was obtained. Single contrast examination was performed using thin liquid barium. This exam was performed by Anders Grant NP, and was supervised and interpreted by Dr. Ruthy Dick FLUOROSCOPY: Radiation Exposure Index (as provided by the fluoroscopic device): 30.1 mGy Kerma COMPARISON:  None Available. FINDINGS: Scout Radiograph: Within normal limit Esophagus:  Normal appearance. Esophageal motility:  Within normal limits. Gastroesophageal reflux:  None visualized. Ingested 13mm barium tablet:  Not given Stomach: Normal appearance. No hiatal hernia. Gastric emptying: Normal. Duodenum:  Normal appearance. Other:  None. IMPRESSION: Focused single contrast exam performed. Patient supine and posterior obliquities. Special attention paid to the stomach and duodenum no evidence of gastric outlet obstruction Electronically Signed   By: Gilmer Mor D.O.   On: 01/23/2023 15:15    Anti-infectives: Anti-infectives (From admission, onward)    Start     Dose/Rate Route Frequency Ordered Stop   01/20/23 2330  vancomycin (VANCOCIN) IVPB 1000 mg/200 mL premix       Placed in "Followed by" Linked Group   1,000 mg 200 mL/hr over 60 Minutes Intravenous  Once 01/20/23 2218 01/21/23 0232   01/20/23 2230  vancomycin (VANCOCIN) IVPB 1000 mg/200 mL premix       Placed in "Followed by" Linked Group   1,000 mg 200 mL/hr  over 60 Minutes Intravenous  Once 01/20/23 2218 01/21/23 0126       Assessment/Plan: s/p Procedure(s): EVACUATION OF LEFT CHEST WALL SEROMA WITH PLACEMENT OF DRAIN (Left) Advance diet. Regular  UGI neg Troponin and EKG neg Cxr neg Ambulate She thinks she will be ready to go home tomorrow  LOS: 4 days     Chevis Pretty III 01/24/2023

## 2023-01-24 NOTE — Plan of Care (Signed)

## 2023-01-25 LAB — CULTURE, BLOOD (ROUTINE X 2)
Culture: NO GROWTH
Culture: NO GROWTH

## 2023-01-25 MED ORDER — METHOCARBAMOL 500 MG PO TABS: 500.0000 mg | ORAL_TABLET | Freq: Four times a day (QID) | ORAL | 2 refills | Status: DC | PRN

## 2023-01-25 MED ORDER — GABAPENTIN 100 MG PO CAPS: 100.0000 mg | ORAL_CAPSULE | Freq: Three times a day (TID) | ORAL | 2 refills | Status: DC

## 2023-01-25 NOTE — Progress Notes (Signed)
Pt has been given AVS, reviewed discharge instructions, IV removed, PRN meds given as requested. Pt became tearful during discharge instructions, stating "I would really prefer a late discharge." Explain to pt that we could transfer her to discharge lounge if she was unable to leave at this time. Pt very tearful at possibility of having to eat "breakfast" in discharge lounge. Service response reports that pt's meal will be delivered at 1051. Explained to pt that she could eat in her room and would need to be discharged after that.

## 2023-01-25 NOTE — Plan of Care (Signed)
  Problem: Education: Goal: Knowledge of General Education information will improve Description: Including pain rating scale, medication(s)/side effects and non-pharmacologic comfort measures 01/25/2023 1249 by Kelli Hope, RN Outcome: Adequate for Discharge 01/25/2023 1249 by Kelli Hope, RN Outcome: Progressing   Problem: Health Behavior/Discharge Planning: Goal: Ability to manage health-related needs will improve 01/25/2023 1249 by Kelli Hope, RN Outcome: Adequate for Discharge 01/25/2023 1249 by Kelli Hope, RN Outcome: Progressing   Problem: Clinical Measurements: Goal: Ability to maintain clinical measurements within normal limits will improve 01/25/2023 1249 by Kelli Hope, RN Outcome: Adequate for Discharge 01/25/2023 1249 by Kelli Hope, RN Outcome: Progressing Goal: Will remain free from infection 01/25/2023 1249 by Kelli Hope, RN Outcome: Adequate for Discharge 01/25/2023 1249 by Kelli Hope, RN Outcome: Progressing Goal: Diagnostic test results will improve 01/25/2023 1249 by Kelli Hope, RN Outcome: Adequate for Discharge 01/25/2023 1249 by Kelli Hope, RN Outcome: Progressing Goal: Respiratory complications will improve 01/25/2023 1249 by Kelli Hope, RN Outcome: Adequate for Discharge 01/25/2023 1249 by Kelli Hope, RN Outcome: Progressing Goal: Cardiovascular complication will be avoided 01/25/2023 1249 by Kelli Hope, RN Outcome: Adequate for Discharge 01/25/2023 1249 by Kelli Hope, RN Outcome: Progressing   Problem: Activity: Goal: Risk for activity intolerance will decrease 01/25/2023 1249 by Kelli Hope, RN Outcome: Adequate for Discharge 01/25/2023 1249 by Kelli Hope, RN Outcome: Progressing   Problem: Nutrition: Goal: Adequate nutrition will be maintained 01/25/2023 1249 by Kelli Hope, RN Outcome: Adequate for Discharge 01/25/2023 1249 by Kelli Hope, RN Outcome: Progressing   Problem: Coping: Goal: Level of anxiety will decrease 01/25/2023 1249 by Kelli Hope, RN Outcome: Adequate for Discharge 01/25/2023 1249 by Kelli Hope, RN Outcome: Progressing   Problem: Elimination: Goal: Will not experience complications related to bowel motility 01/25/2023 1249 by Kelli Hope, RN Outcome: Adequate for Discharge 01/25/2023 1249 by Kelli Hope, RN Outcome: Progressing Goal: Will not experience complications related to urinary retention 01/25/2023 1249 by Kelli Hope, RN Outcome: Adequate for Discharge 01/25/2023 1249 by Kelli Hope, RN Outcome: Progressing   Problem: Pain Management: Goal: General experience of comfort will improve 01/25/2023 1249 by Kelli Hope, RN Outcome: Adequate for Discharge 01/25/2023 1249 by Kelli Hope, RN Outcome: Progressing   Problem: Safety: Goal: Ability to remain free from injury will improve 01/25/2023 1249 by Kelli Hope, RN Outcome: Adequate for Discharge 01/25/2023 1249 by Kelli Hope, RN Outcome: Progressing   Problem: Skin Integrity: Goal: Risk for impaired skin integrity will decrease 01/25/2023 1249 by Kelli Hope, RN Outcome: Adequate for Discharge 01/25/2023 1249 by Kelli Hope, RN Outcome: Progressing

## 2023-01-25 NOTE — Discharge Summary (Signed)
Physician Discharge Summary  Patient ID: Leah Shea MRN: [id number] DOB/AGE: 37-Feb-1987 37 y.o.  Admit date: 01/20/2023 Discharge date: 01/25/2023  Admission Diagnoses:  Discharge Diagnoses:  Principal Problem:   Cellulitis of breast   Discharged Condition: good  Hospital Course: The patient was readmitted with a seroma of the left chest wall.  She underwent placement of a drain into the seroma.  After several days of pain management she was ready for discharge home.  She will contact her pain doctor for any needs from a pain standpoint.  We will plan to see her back in about 2 weeks to check the drain output  Consults: None  Significant Diagnostic Studies: none  Treatments: As above  Discharge Exam: Blood pressure (!) 153/88, pulse 81, temperature 98 F (36.7 C), temperature source Oral, resp. rate 18, height 5\' 5"  (1.651 m), weight 90.7 kg, last menstrual period 12/11/2022, SpO2 98%, unknown if currently breastfeeding. Chest wall: skin flaps viable  Disposition: Discharge disposition: 01-Home or Self Care       Discharge Instructions     Call MD for:  difficulty breathing, headache or visual disturbances   Complete by: As directed    Call MD for:  extreme fatigue   Complete by: As directed    Call MD for:  hives   Complete by: As directed    Call MD for:  persistant dizziness or light-headedness   Complete by: As directed    Call MD for:  persistant nausea and vomiting   Complete by: As directed    Call MD for:  redness, tenderness, or signs of infection (pain, swelling, redness, odor or green/yellow discharge around incision site)   Complete by: As directed    Call MD for:  severe uncontrolled pain   Complete by: As directed    Call MD for:  temperature >100.4   Complete by: As directed    Diet - low sodium heart healthy   Complete by: As directed    Discharge instructions   Complete by: As directed    Sponge bathe while drain is in.  Diet as  tolerated.  No overhead reaching with the left arm until drain is removed   Increase activity slowly   Complete by: As directed    No wound care   Complete by: As directed       Allergies as of 01/25/2023       Reactions   Mushroom Extract Complex (do Not Select) Anaphylaxis, Hives   Other Anaphylaxis, Hives   Mushrooms   Nsaids Hives, Swelling, Other (See Comments)   "Body burns" Tolerated celecoxib 09/2020   Asa [aspirin] Hives   Prenatal Vitamins Nausea And Vomiting, Other (See Comments)   Body did not tolerated any type per patient   Compazine [prochlorperazine] Anxiety   Reglan [metoclopramide] Anxiety   Sulfa Antibiotics Hives, Itching, Other (See Comments)   "Teary eyes/itching throat/anxiety"        Medication List     TAKE these medications    acetaminophen 500 MG tablet Commonly known as: TYLENOL Take 1,000 mg by mouth every 6 (six) hours as needed for moderate pain (pain score 4-6).   ALPRAZolam 1 MG tablet Commonly known as: XANAX Take 1 mg by mouth in the morning, at noon, and at bedtime.   amoxicillin-clavulanate 400-57 MG/5ML suspension Commonly known as: AUGMENTIN Take 10 mLs by mouth every 12 (twelve) hours.   escitalopram 20 MG tablet Commonly known as: LEXAPRO TAKE ONE TABLET BY MOUTH ONCE  A DAY FOR DEPRESSION AND ANXIETY   gabapentin 300 MG capsule Commonly known as: NEURONTIN Take 300 mg by mouth 3 (three) times daily. What changed: Another medication with the same name was added. Make sure you understand how and when to take each.   gabapentin 100 MG capsule Commonly known as: NEURONTIN Take 1 capsule (100 mg total) by mouth 3 (three) times daily. What changed: You were already taking a medication with the same name, and this prescription was added. Make sure you understand how and when to take each.   lamoTRIgine 200 MG tablet Commonly known as: LAMICTAL Take 200 mg by mouth 2 (two) times daily.   levETIRAcetam 500 MG  tablet Commonly known as: KEPPRA Take 1,000 mg by mouth 2 (two) times daily.   methocarbamol 750 MG tablet Commonly known as: ROBAXIN Take 750 mg by mouth 3 (three) times daily. What changed: Another medication with the same name was added. Make sure you understand how and when to take each.   methocarbamol 500 MG tablet Commonly known as: ROBAXIN Take 1 tablet (500 mg total) by mouth every 6 (six) hours as needed for muscle spasms. What changed: You were already taking a medication with the same name, and this prescription was added. Make sure you understand how and when to take each.   omeprazole 20 MG capsule Commonly known as: PRILOSEC Take 1 capsule (20 mg total) by mouth daily. What changed: when to take this   ondansetron 4 MG disintegrating tablet Commonly known as: ZOFRAN-ODT Take 1 tablet (4 mg total) by mouth every 4 (four) hours as needed for nausea or vomiting. What changed: when to take this   ondansetron 8 MG tablet Commonly known as: ZOFRAN Take 8 mg by mouth 3 (three) times daily.   oxyCODONE-acetaminophen 10-325 MG tablet Commonly known as: PERCOCET Take 1 tablet by mouth every 6 (six) hours as needed for pain.   promethazine 25 MG tablet Commonly known as: PHENERGAN Take 1 tablet (25 mg total) by mouth every 8 (eight) hours as needed for nausea or vomiting. What changed: when to take this   scopolamine 1 MG/3DAYS Commonly known as: TRANSDERM-SCOP Place 1 patch onto the skin every three (3) days as needed (nausea/vomiting).   SUMAtriptan 6 MG/0.5ML Sosy injection Commonly known as: IMITREX INJECT 6MG  (ONE SINGLE DOSE AUTOINJECTOR) SUBCUTANEOUSLY TWICE A DAY AS NEEDED FOR MIGRAINE   albuterol (2.5 MG/3ML) 0.083% nebulizer solution Commonly known as: PROVENTIL Take 2.5 mg by nebulization every 6 (six) hours as needed for wheezing or shortness of breath.   Ventolin HFA 108 (90 Base) MCG/ACT inhaler Generic drug: albuterol Inhale 2 puffs into the lungs  every 4 (four) hours as needed for wheezing or shortness of breath.   Vitamin D (Ergocalciferol) 1.25 MG (50000 UNIT) Caps capsule Commonly known as: DRISDOL Take 50,000 Units by mouth once a week. On Mondays        Follow-up Information     Chevis Pretty III, MD Follow up in 2 week(s).   Specialty: General Surgery Contact information: [address] [place] [address] [phone]                 Signed: Chevis Pretty III 01/25/2023, 9:36 AM

## 2023-01-25 NOTE — Progress Notes (Signed)
4 Days Post-Op   Subjective/Chief Complaint: Complains of pain but seems to be improving   Objective: Vital signs in last 24 hours: Temp:  [97.4 F (36.3 C)-98.3 F (36.8 C)] 98 F (36.7 C) (12/11 0424) Pulse Rate:  [71-85] 81 (12/11 0424) Resp:  [18] 18 (12/11 0424) BP: (120-159)/(87-94) 153/88 (12/11 0424) SpO2:  [98 %-100 %] 98 % (12/11 0424) Last BM Date : 01/18/23  Intake/Output from previous day: 12/10 0701 - 12/11 0700 In: 1455 [P.O.:1440] Out: 35 [Drains:35] Intake/Output this shift: No intake/output data recorded.  General appearance: alert and cooperative Resp: clear to auscultation bilaterally Chest wall: skin flaps viable Cardio: regular rate and rhythm GI: soft, non-tender; bowel sounds normal; no masses,  no organomegaly  Lab Results:  Recent Labs    01/23/23 0922  WBC 7.3  HGB 9.9*  HCT 30.5*  PLT 281   BMET No results for input(s): "NA", "K", "CL", "CO2", "GLUCOSE", "BUN", "CREATININE", "CALCIUM" in the last 72 hours. PT/INR No results for input(s): "LABPROT", "INR" in the last 72 hours. ABG No results for input(s): "PHART", "HCO3" in the last 72 hours.  Invalid input(s): "PCO2", "PO2"  Studies/Results: DG Chest 2 View  Result Date: 01/23/2023 CLINICAL DATA:  [id number] Chest pain on respiration [id number] EXAM: CHEST - 2 VIEW COMPARISON:  12/31/2022 chest radiograph. FINDINGS: Stable cardiomediastinal silhouette with normal heart size. No pneumothorax. No pleural effusion. Lungs appear clear, with no acute consolidative airspace disease and no pulmonary edema. Oral contrast seen in gastric fundus. A surgical drain overlies the lateral lower left chest wall. Surgical clips overlie the lateral breasts bilaterally. IMPRESSION: No active cardiopulmonary disease. Electronically Signed   By: Delbert Phenix M.D.   On: 01/23/2023 17:27   DG UGI W SINGLE CM (SOL OR THIN BA)  Result Date: 01/23/2023 CLINICAL DATA:  [facility] medical history including bilateral  mastectomies December 23, 2022 with immediate reconstruction. And recurrent left chest wall seroma status post evacuation on January 21, 2023. Now with chest pain and vomitin. Team requesting an upper GI for further evaluation. EXAM: DG UGI W SINGLE CM TECHNIQUE: Scout radiograph was obtained. Single contrast examination was performed using thin liquid barium. This exam was performed by Anders Grant NP, and was supervised and interpreted by Dr. Ruthy Dick FLUOROSCOPY: Radiation Exposure Index (as provided by the fluoroscopic device): 30.1 mGy Kerma COMPARISON:  None Available. FINDINGS: Scout Radiograph: Within normal limit Esophagus:  Normal appearance. Esophageal motility:  Within normal limits. Gastroesophageal reflux:  None visualized. Ingested 13mm barium tablet:  Not given Stomach: Normal appearance. No hiatal hernia. Gastric emptying: Normal. Duodenum:  Normal appearance. Other:  None. IMPRESSION: Focused single contrast exam performed. Patient supine and posterior obliquities. Special attention paid to the stomach and duodenum no evidence of gastric outlet obstruction Electronically Signed   By: Gilmer Mor D.O.   On: 01/23/2023 15:15    Anti-infectives: Anti-infectives (From admission, onward)    Start     Dose/Rate Route Frequency Ordered Stop   01/20/23 2330  vancomycin (VANCOCIN) IVPB 1000 mg/200 mL premix       Placed in "Followed by" Linked Group   1,000 mg 200 mL/hr over 60 Minutes Intravenous  Once 01/20/23 2218 01/21/23 0232   01/20/23 2230  vancomycin (VANCOCIN) IVPB 1000 mg/200 mL premix       Placed in "Followed by" Linked Group   1,000 mg 200 mL/hr over 60 Minutes Intravenous  Once 01/20/23 2218 01/21/23 0126       Assessment/Plan: s/p Procedure(s):  EVACUATION OF LEFT CHEST WALL SEROMA WITH PLACEMENT OF DRAIN (Left) Advance diet Discharge Continue drain  LOS: 5 days    Leah Shea 01/25/2023

## 2023-05-17 ENCOUNTER — Encounter: Payer: Self-pay | Admitting: General Surgery

## 2023-06-23 ENCOUNTER — Emergency Department (HOSPITAL_COMMUNITY)
Admission: EM | Admit: 2023-06-23 | Discharge: 2023-06-24 | Disposition: A | Discharge status: Home / Self Care | Attending: Emergency Medicine | Admitting: Emergency Medicine

## 2023-06-23 ENCOUNTER — Encounter (HOSPITAL_COMMUNITY): Payer: Self-pay | Admitting: Emergency Medicine

## 2023-06-23 ENCOUNTER — Other Ambulatory Visit: Payer: Self-pay

## 2023-06-23 DIAGNOSIS — R1084 Generalized abdominal pain: Secondary | ICD-10-CM | POA: Diagnosis not present

## 2023-06-23 DIAGNOSIS — J45909 Unspecified asthma, uncomplicated: Secondary | ICD-10-CM | POA: Insufficient documentation

## 2023-06-23 DIAGNOSIS — Z853 Personal history of malignant neoplasm of breast: Secondary | ICD-10-CM | POA: Diagnosis not present

## 2023-06-23 DIAGNOSIS — R109 Unspecified abdominal pain: Secondary | ICD-10-CM | POA: Diagnosis present

## 2023-06-23 DIAGNOSIS — G4489 Other headache syndrome: Secondary | ICD-10-CM

## 2023-06-23 DIAGNOSIS — R0789 Other chest pain: Secondary | ICD-10-CM | POA: Diagnosis not present

## 2023-06-23 DIAGNOSIS — K625 Hemorrhage of anus and rectum: Secondary | ICD-10-CM

## 2023-06-23 LAB — COMPREHENSIVE METABOLIC PANEL WITH GFR
ALT: 11 U/L (ref 0–44)
AST: 16 U/L (ref 15–41)
Albumin: 3.9 g/dL (ref 3.5–5.0)
Alkaline Phosphatase: 71 U/L (ref 38–126)
Anion gap: 12 (ref 5–15)
BUN: 13 mg/dL (ref 6–20)
CO2: 21 mmol/L — ABNORMAL LOW (ref 22–32)
Calcium: 9.6 mg/dL (ref 8.9–10.3)
Chloride: 106 mmol/L (ref 98–111)
Creatinine, Ser: 1.06 mg/dL — ABNORMAL HIGH (ref 0.44–1.00)
GFR, Estimated: >60 mL/min (ref >60)
Glucose, Bld: 95 mg/dL (ref 70–99)
Potassium: 3.9 mmol/L (ref 3.5–5.1)
Sodium: 139 mmol/L (ref 135–145)
Total Bilirubin: 0.3 mg/dL (ref 0.0–1.2)
Total Protein: 7.4 g/dL (ref 6.5–8.1)

## 2023-06-23 LAB — TYPE AND SCREEN
ABO/RH(D): A POS
Antibody Screen: NEGATIVE

## 2023-06-23 LAB — CBC
HCT: 37.4 % (ref 36.0–46.0)
Hemoglobin: 12.5 g/dL (ref 12.0–15.0)
MCH: 29.2 pg (ref 26.0–34.0)
MCHC: 33.4 g/dL (ref 30.0–36.0)
MCV: 87.4 fL (ref 80.0–100.0)
Platelets: 247 10*3/uL (ref 150–400)
RBC: 4.28 MIL/uL (ref 3.87–5.11)
RDW: 12.4 % (ref 11.5–15.5)
WBC: 5.9 10*3/uL (ref 4.0–10.5)
nRBC: 0 % (ref 0.0–0.2)

## 2023-06-23 LAB — HCG, SERUM, QUALITATIVE: Preg, Serum: NEGATIVE

## 2023-06-23 LAB — POC OCCULT BLOOD, ED: Fecal Occult Bld: NEGATIVE

## 2023-06-23 MED ORDER — HYDROMORPHONE HCL 1 MG/ML IJ SOLN
1.0000 mg | Freq: Once | INTRAMUSCULAR | Status: AC
  Administered 2023-06-23: 1 mg via INTRAVENOUS
  Filled 2023-06-23: qty 1

## 2023-06-23 MED ORDER — MORPHINE SULFATE (PF) 4 MG/ML IV SOLN
4.0000 mg | Freq: Once | INTRAVENOUS | Status: AC
  Administered 2023-06-23: 4 mg via INTRAVENOUS
  Filled 2023-06-23: qty 1

## 2023-06-23 MED ORDER — LACTATED RINGERS IV BOLUS
1000.0000 mL | Freq: Once | INTRAVENOUS | Status: AC
  Administered 2023-06-23: 1000 mL via INTRAVENOUS

## 2023-06-23 MED ORDER — ONDANSETRON HCL 4 MG/2ML IJ SOLN
4.0000 mg | Freq: Once | INTRAMUSCULAR | Status: AC
  Administered 2023-06-23: 4 mg via INTRAVENOUS
  Filled 2023-06-23: qty 2

## 2023-06-23 NOTE — ED Triage Notes (Signed)
 PT complaints of abd pain, migraine, nausea and vomiting x 4 days. Stool has been black and tarry for 4 days. PT had dbl mastectomy 6 months ago and feels like fluid is building up on left chest.

## 2023-06-23 NOTE — ED Provider Triage Note (Signed)
 Emergency Medicine Provider Triage Evaluation Note  Merleen Ashleymarie Dinsmore , a 38 y.o. female  was evaluated in triage.  Pt complains of multiple complaints but notably tarry diarrhea for the past several days along with some abdominal pain. Hemoglobin stable.  Hemodynamically stable.  Will order a CT abdomen pelvis with contrast.  Review of Systems  Positive: As above Negative: As above  Physical Exam  BP (!) 146/106 (BP Location: Right Arm)   Pulse 78   Temp 98.4 F (36.9 C) (Oral)   Resp 16   Wt 90 kg   LMP 11/05/2022 (Approximate)   SpO2 100%   BMI 33.02 kg/m  Gen:   Awake, no distress   Resp:  Normal effort  MSK:   Moves extremities without difficulty  Other:    Medical Decision Making  Medically screening exam initiated at 5:40 PM.  Appropriate orders placed.  Samyia Meshon Meulemans was informed that the remainder of the evaluation will be completed by another provider, this initial triage assessment does not replace that evaluation, and the importance of remaining in the ED until their evaluation is complete.     Lucina Sabal, PA-C 06/23/23 1740

## 2023-06-23 NOTE — ED Notes (Signed)
 Pa notified of this acuity  2

## 2023-06-23 NOTE — ED Provider Notes (Signed)
 [place] EMERGENCY DEPARTMENT AT [facility] Provider Note   CSN: [id number] Arrival date & time: 06/23/23  1646     History {Add pertinent medical, surgical, social history, OB history to HPI:1} Chief Complaint  Patient presents with   Rectal Bleeding   Migraine    Leah Shea is a 38 y.o. female.  Patient is a 38 year old female with a history of asthma, pituitary tumor which is being followed regularly, prior seizures, PTSD and depression, breast cancer status post bilateral mastectomy with a secondary infection after the surgery in December which was treated with antibiotics who is presenting today with multiple complaints.  She reports for the last 3 to 4 days that she has been having abdominal pain which she reports goes down in the middle of her abdomen and is very uncomfortable in nature.  It hurts to move and she has had significant decrease in appetite as well as a migraine headache for the last few days as well.  She has had nausea and vomiting associated with this abdominal pain as well.  She reports in the last 2 days she has also noticed pain in her left breast and swelling like in the past when there had been infection.  She has felt that it has been hot but has not noticed it draining anything.  She also reports in the last few days her 2 stools have been black and tarry and she also saw a little bright red blood in it.  Her last bowel movement was yesterday.  She is not having any urinary symptoms.  She stopped having menses a while ago and is checking pregnancy test twice a month but they have always been negative.  No speech issues or unilateral numbness or weakness.  Vision is unchanged.  The history is provided by the patient and medical records.  Rectal Bleeding Migraine       Home Medications Prior to Admission medications   Medication Sig Start Date End Date Taking? Authorizing Provider  acetaminophen  (TYLENOL ) 500 MG tablet Take 1,000 mg by  mouth every 6 (six) hours as needed for moderate pain (pain score 4-6).    [provider]  albuterol  (PROVENTIL ) (2.5 MG/3ML) 0.083% nebulizer solution Take 2.5 mg by nebulization every 6 (six) hours as needed for wheezing or shortness of breath. 03/29/11   [provider]  ALPRAZolam  (XANAX ) 1 MG tablet Take 1 mg by mouth in the morning, at noon, and at bedtime. 11/12/22   [provider]  amoxicillin -clavulanate (AUGMENTIN ) 400-57 MG/5ML suspension Take 10 mLs by mouth every 12 (twelve) hours. Patient not taking: Reported on 01/21/2023 01/06/23   [provider]  escitalopram  (LEXAPRO ) 20 MG tablet TAKE ONE TABLET BY MOUTH ONCE A DAY FOR DEPRESSION AND ANXIETY 11/10/20   [provider]  gabapentin  (NEURONTIN ) 100 MG capsule Take 1 capsule (100 mg total) by mouth 3 (three) times daily. 01/25/23   Lillette Reid III, MD  gabapentin  (NEURONTIN ) 300 MG capsule Take 300 mg by mouth 3 (three) times daily. 01/06/23   [provider]  lamoTRIgine  (LAMICTAL ) 200 MG tablet Take 200 mg by mouth 2 (two) times daily. 06/03/21   [provider]  levETIRAcetam  (KEPPRA ) 500 MG tablet Take 1,000 mg by mouth 2 (two) times daily. 06/03/21   [provider]  methocarbamol  (ROBAXIN ) 500 MG tablet Take 1 tablet (500 mg total) by mouth every 6 (six) hours as needed for muscle spasms. 01/25/23   Caralyn Chandler, MD  methocarbamol  (ROBAXIN ) 750 MG tablet Take 750 mg by mouth 3 (three) times daily. 01/06/23   [provider]  omeprazole  (PRILOSEC) 20 MG capsule Take 1 capsule (20 mg total) by mouth daily. Patient taking differently: Take 20 mg by mouth daily before breakfast. 05/05/21   Elois Hair, MD  ondansetron  (ZOFRAN ) 8 MG tablet Take 8 mg by mouth 3 (three) times daily. 01/06/23   [provider]  ondansetron  (ZOFRAN -ODT) 4 MG disintegrating tablet Take 1 tablet (4 mg total) by mouth every 4 (four) hours as needed for nausea or  vomiting. Patient taking differently: Take 4 mg by mouth every 4 (four) hours. 09/22/21   Agatha Alcon, CNM  oxyCODONE -acetaminophen  (PERCOCET) 10-325 MG tablet Take 1 tablet by mouth every 6 (six) hours as needed for pain.    [provider]  promethazine  (PHENERGAN ) 25 MG tablet Take 1 tablet (25 mg total) by mouth every 8 (eight) hours as needed for nausea or vomiting. Patient taking differently: Take 25 mg by mouth at bedtime. 09/22/21   Agatha Alcon, CNM  scopolamine  (TRANSDERM-SCOP) 1 MG/3DAYS Place 1 patch onto the skin every three (3) days as needed (nausea/vomiting).    [provider]  SUMAtriptan  (IMITREX ) 6 MG/0.5ML SOSY injection INJECT 6MG  (ONE SINGLE DOSE AUTOINJECTOR) SUBCUTANEOUSLY TWICE A DAY AS NEEDED FOR MIGRAINE 06/03/21   [provider]  VENTOLIN  HFA 108 (90 Base) MCG/ACT inhaler Inhale 2 puffs into the lungs every 4 (four) hours as needed for wheezing or shortness of breath. 10/27/22   [provider]  Vitamin D , Ergocalciferol , (DRISDOL ) 1.25 MG (50000 UNIT) CAPS capsule Take 50,000 Units by mouth once a week. On Mondays 09/22/22   [provider]      Allergies    Mushroom extract complex (obsolete), Other, Nsaids, Asa [aspirin], Prenatal vitamins, Compazine  [prochlorperazine ], Reglan  [metoclopramide ], and Sulfa antibiotics    Review of Systems   Review of Systems  Gastrointestinal:  Positive for hematochezia.    Physical Exam Updated Vital Signs BP (!) 130/104   Pulse 81   Temp 98.2 F (36.8 C)   Resp 18   Wt 90 kg   LMP 11/05/2022 (Approximate)   SpO2 99%   BMI 33.02 kg/m  Physical Exam Vitals and nursing note reviewed.  Constitutional:      General: She is not in acute distress.    Appearance: She is well-developed.  HENT:     Head: Normocephalic and atraumatic.  Eyes:     Pupils: Pupils are equal, round, and reactive to light.  Cardiovascular:     Rate and Rhythm: Normal rate and regular rhythm.      Heart sounds: Normal heart sounds. No murmur heard.    No friction rub.  Pulmonary:     Effort: Pulmonary effort is normal.     Breath sounds: Normal breath sounds. No wheezing or rales.     Comments: Bilateral mastectomy with some extra tissue present.  Tenderness with palpation over the left mastectomy site but no induration, warmth or redness noted.  No drainage from incision line.  So some tenderness up into the axilla Chest:     Chest wall: Tenderness present.  Abdominal:     General: Bowel sounds are normal. There is no distension.     Palpations: Abdomen is soft.     Tenderness: There is generalized abdominal tenderness. There is no guarding or rebound.     Comments: No palpable incarcerated hernias but very tender around the umbilicus.  Genitourinary:    Rectum: Guaiac result negative.     Comments: Stool is a light brown color without evidence of any hemorrhoids Musculoskeletal:        General: No tenderness. Normal range of motion.     Comments: No edema  Skin:    General: Skin is warm and dry.     Findings: No rash.  Neurological:     Mental Status: She is alert and oriented to person, place, and time.     Cranial Nerves: No cranial nerve deficit.     Sensory: No sensory deficit.     Motor: No weakness.     Coordination: Coordination normal.     Gait: Gait normal.  Psychiatric:        Behavior: Behavior normal.     ED Results / Procedures / Treatments   Labs (all labs ordered are listed, but only abnormal results are displayed) Labs Reviewed  COMPREHENSIVE METABOLIC PANEL WITH GFR - Abnormal; Notable for the following components:      Result Value   CO2 21 (*)    Creatinine, Ser 1.06 (*)    All other components within normal limits  CBC  HCG, SERUM, QUALITATIVE  POC OCCULT BLOOD, ED  TYPE AND SCREEN    EKG None  Radiology No results found.  Procedures Procedures  {Document cardiac monitor, telemetry assessment procedure when  appropriate:1}  Medications Ordered in ED Medications  lactated ringers  bolus 1,000 mL (1,000 mLs Intravenous New Bag/Given 06/23/23 2106)  morphine  (PF) 4 MG/ML injection 4 mg (4 mg Intravenous Given 06/23/23 2107)  ondansetron  (ZOFRAN ) injection 4 mg (4 mg Intravenous Given 06/23/23 2106)    ED Course/ Medical Decision Making/ A&P   {   Click here for ABCD2, HEART and other calculatorsREFRESH Note before signing :1}                              Medical Decision Making Amount and/or Complexity of Data Reviewed Labs: ordered. Radiology: ordered.  Risk Prescription drug management.   Pt with multiple medical problems and comorbidities and presenting today with a complaint that caries a high risk for morbidity and mortality.  Patient presenting with the above complaints.  Concern for possible recurrent infection developing in the left breast where she had had cellulitis in the past versus an acute abdominal pathology with central abdominal pain and she reports blood in her stool however Hemoccult is negative here.  She is hemodynamically stable but has history of ventral hernia.  No obvious signs of incarceration but CT to further evaluate for colitis, appendicitis, acute ovarian pathology or renal pathology.  Patient has not been having menses but low suspicion for pregnancy at this time.  Also patient is complaining of a headache and has history of pituitary adenoma.  Last MRI was done in October.  Will do a CT to assure no evidence of increased intracranial pressure or edema, also will do a chest CT of the chest abdomen pelvis to further investigate other complaints.  I dependently interpreted patient's labs and CBC, CMP without acute findings except for mild elevated creatinine of 1, LFTs were within normal limits.  Fecal occult was negative.  Patient was given pain and nausea control.  She was given IV fluids.  Scans are pending.   {Document critical care time when appropriate:1} {Document  review of labs and clinical decision tools ie heart score, Chads2Vasc2 etc:1}  {Document your independent review of  radiology images, and any outside records:1} {Document your discussion with family members, caretakers, and with consultants:1} {Document social determinants of health affecting pt's care:1} {Document your decision making why or why not admission, treatments were needed:1} Final Clinical Impression(s) / ED Diagnoses Final diagnoses:  None    Rx / DC Orders ED Discharge Orders     None

## 2023-06-24 ENCOUNTER — Emergency Department (HOSPITAL_COMMUNITY)

## 2023-06-24 MED ORDER — IOHEXOL 350 MG/ML SOLN
75.0000 mL | Freq: Once | INTRAVENOUS | Status: AC | PRN
  Administered 2023-06-24: 75 mL via INTRAVENOUS

## 2023-06-24 MED ORDER — HYDROMORPHONE HCL 1 MG/ML IJ SOLN
1.0000 mg | Freq: Once | INTRAMUSCULAR | Status: AC
  Administered 2023-06-24: 1 mg via INTRAVENOUS
  Filled 2023-06-24: qty 1

## 2023-06-24 MED ORDER — FENTANYL CITRATE PF 50 MCG/ML IJ SOSY
50.0000 ug | PREFILLED_SYRINGE | Freq: Once | INTRAMUSCULAR | Status: AC
  Administered 2023-06-24: 50 ug via INTRAVENOUS
  Filled 2023-06-24: qty 1

## 2023-06-24 NOTE — ED Provider Notes (Signed)
 I assumed care at signout to follow-up on imaging and reassess patient. Patient presents with multiple complaints including concern for abdominal pain and rectal bleeding Also concerned about breast pain and diffuse body pain and headaches  Extensive evaluation including CT head, CT chest ABD and pelvis do not reveal any acute abdominal or neurologic emergency There is no evidence of acute GI bleed  Discussed findings at length with patient and her family.  They expressed frustration that we were unable to find a cause for all of her issues In terms of the rectal bleeding, I have referred her to a local gastroenterologist that she reports last colonoscopy was over 2 years ago at the [facility] I reassured her that there is no signs of active GI bleed with a negative Hemoccult and normal hemoglobin Also reassured her that there is no signs of diverticulitis though she does have diverticulosis which is a chronic finding  She will need further follow-up as she reports diffuse body aches and headaches and nausea we will been unable to completely evaluate this in the ER   Eldon Greenland, MD 06/24/23 [phone]

## 2023-06-24 NOTE — ED Notes (Signed)
 Patient expressing feelings of nausea MD Wickline Notified via EPIC chat.

## 2023-06-24 NOTE — ED Notes (Addendum)
 Patient verbally expressing frustrations with medical treatment and MD Wickline. Charge nurse Megan notified and patient advocacy information given to patient along with AVS.

## 2023-06-24 NOTE — ED Notes (Addendum)
 Notified MD Wickline via EPIC chat of patient Abdominal pain 8/10. (See Mar and chart for new orders)

## 2023-06-24 NOTE — Discharge Instructions (Signed)

## 2023-06-24 NOTE — ED Notes (Signed)
 Charge nurse Megan at bedside to speak with patient and address concerns prior to discharge.

## 2023-07-27 ENCOUNTER — Ambulatory Visit: Admitting: Gastroenterology

## 2023-07-27 ENCOUNTER — Other Ambulatory Visit (INDEPENDENT_AMBULATORY_CARE_PROVIDER_SITE_OTHER)

## 2023-07-27 ENCOUNTER — Encounter: Payer: Self-pay | Admitting: Gastroenterology

## 2023-07-27 VITALS — BP 160/90 | HR 89 | Ht 65.0 in | Wt 183.0 lb

## 2023-07-27 DIAGNOSIS — F419 Anxiety disorder, unspecified: Secondary | ICD-10-CM

## 2023-07-27 DIAGNOSIS — G8929 Other chronic pain: Secondary | ICD-10-CM

## 2023-07-27 DIAGNOSIS — K921 Melena: Secondary | ICD-10-CM

## 2023-07-27 DIAGNOSIS — R1084 Generalized abdominal pain: Secondary | ICD-10-CM | POA: Diagnosis not present

## 2023-07-27 DIAGNOSIS — K5909 Other constipation: Secondary | ICD-10-CM | POA: Diagnosis not present

## 2023-07-27 DIAGNOSIS — R1033 Periumbilical pain: Secondary | ICD-10-CM

## 2023-07-27 DIAGNOSIS — R112 Nausea with vomiting, unspecified: Secondary | ICD-10-CM

## 2023-07-27 LAB — CBC WITH DIFFERENTIAL/PLATELET
Basophils Absolute: 0.1 10*3/uL (ref 0.0–0.1)
Basophils Relative: 1.1 % (ref 0.0–3.0)
Eosinophils Absolute: 0.3 10*3/uL (ref 0.0–0.7)
Eosinophils Relative: 4.6 % (ref 0.0–5.0)
HCT: 38 % (ref 36.0–46.0)
Hemoglobin: 12.7 g/dL (ref 12.0–15.0)
Lymphocytes Relative: 37.4 % (ref 12.0–46.0)
Lymphs Abs: 2.4 10*3/uL (ref 0.7–4.0)
MCHC: 33.4 g/dL (ref 30.0–36.0)
MCV: 87.8 fl (ref 78.0–100.0)
Monocytes Absolute: 0.4 10*3/uL (ref 0.1–1.0)
Monocytes Relative: 6.8 % (ref 3.0–12.0)
Neutro Abs: 3.3 10*3/uL (ref 1.4–7.7)
Neutrophils Relative %: 50.1 % (ref 43.0–77.0)
Platelets: 254 10*3/uL (ref 150.0–400.0)
RBC: 4.33 Mil/uL (ref 3.87–5.11)
RDW: 13.2 % (ref 11.5–15.5)
WBC: 6.5 10*3/uL (ref 4.0–10.5)

## 2023-07-27 NOTE — Patient Instructions (Signed)
 Your provider has requested that you go to the basement level for lab work before leaving today. Press B on the elevator. The lab is located at the first door on the left as you exit the elevator.  _______________________________________________________  If your blood pressure at your visit was 140/90 or greater, please contact your primary care physician to follow up on this.  _______________________________________________________  If you are age 38 or older, your body mass index should be between 23-30. Your Body mass index is 30.45 kg/m. If this is out of the aforementioned range listed, please consider follow up with your Primary Care Provider.  If you are age 44 or younger, your body mass index should be between 19-25. Your Body mass index is 30.45 kg/m. If this is out of the aformentioned range listed, please consider follow up with your Primary Care Provider.   ________________________________________________________  The [place] [facility] providers would like to encourage you to use MYCHART to communicate with providers for non-urgent requests or questions.  Due to long hold times on the telephone, sending your provider a message by [facility] may be a faster and more efficient way to get a response.  Please allow 48 business hours for a response.  Please remember that this is for non-urgent requests.  _______________________________________________________   It was a pleasure to see you today!  Thank you for trusting me with your gastrointestinal care!    Scott E.Cherryl Corona, MD

## 2023-07-27 NOTE — Progress Notes (Unsigned)
 Discussed the use of AI scribe software for clinical note transcription with the patient, who gave verbal consent to proceed.  HPI : Leah Shea is a 38 year old female who presents with worsening gastrointestinal symptoms including nausea, vomiting, and blood in stool.  She has been experiencing ongoing gastrointestinal issues, including nausea, vomiting, and abdominal pain, since her last visit in 2023. The abdominal pain is primarily around the umbilical region and radiates to the right lower quadrant, with episodes extending from the back to the front. The nausea and vomiting have progressively worsened.  She reports blood in her stool, describing it as both red and tarry, with a 'black and gooey' consistency. She experiences constipation, having bowel movements only once a week with the aid of medications like Miralax  and milk of magnesia. She also takes Metamucil and another fiber supplement provided by the [place]. She notes a distinctive smell reminiscent of C. diff, although she denies frequent diarrhea.  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 She currently has three new lumps that require biopsy, two in the right and one in the left chest, which developed after the mastectomy. She also mentions a brain tumor and recurrent infections in her chest area.  She has a history of elevated liver enzymes, although current levels are normal. She had a colonoscopy in 2020 or 2021, but not since the birth of her son. She experiences a lack of appetite and pain when eating, leading her to consume nutritional supplements like Ensure. She attempts to eat salads daily for roughage.  Her current medications include Lexapro  and Lamictal .   A CT of the chest/abdomen/pelvis May 10 showed no acute abnormalities.  FOBT May 9th was negative.  CBC May 9 with hgb 12.5, MCV 87       CT  C/A/P IMPRESSION: 1. No acute  findings within the chest, abdomen or pelvis. 2. Postsurgical changes are identified within the left breast. No chest wall mass or focal fluid collections identified. 3. Stable 3 mm subpleural nodule within the lateral left lower lobe. 4. Colonic diverticulosis without signs of acute diverticulitis. 5. Fat containing umbilical hernia, small.  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   Transferred from [facility] problem list   IBS (irritable bowel syndrome)    Nausea and vomiting during pregnancy 07/25/2020   Pituitary tumor    [facility], 12/15/2022   PTSD (post-traumatic stress disorder)    Shortness of breath    with exertion or panic attack   Sickle cell trait (HCC)    Sleep apnea    awaiting a sleep study to be done at [facility] ([facility])     Past Surgical History:  Procedure Laterality Date   ABDOMINAL HERNIA REPAIR  10/08/2020   BREAST RECONSTRUCTION WITH PLACEMENT OF TISSUE EXPANDER AND ALLODERM Bilateral 12/23/2022   Procedure: BREAST RECONSTRUCTION WITH PLACEMENT OF TISSUE EXPANDER;  Surgeon: Teretha Ferguson, MD;  Location: [facility] OR;  Service: Plastics;  Laterality: Bilateral;   BREAST SURGERY Bilateral    reductions   CESAREAN SECTION  2010, 2012,2014  x 3   CESAREAN SECTION N/A 09/28/2020   Procedure: CESAREAN SECTION;  Surgeon: Teena Feast, MD;  Location: [facility] LD ORS;  Service: Obstetrics;  Laterality: N/A;   EVACUATION BREAST HEMATOMA Left 01/21/2023   Procedure: EVACUATION OF LEFT CHEST WALL SEROMA WITH PLACEMENT OF DRAIN;  Surgeon: Dareen Ebbing, MD;  Location: [facility] OR;  Service: General;  Laterality: Left;   EYE SURGERY     x 10 as a child   LAPAROTOMY N/A 10/08/2020    Procedure: EXPLORATORY LAPAROTOMY WITH CLOSURE ABDOMINAL WALL DEFECT POSSIBLE BOWEL RESECTION;  Surgeon: Sim Dryer, MD;  Location: [facility] OR;  Service: General;  Laterality: N/A;   PILONIDAL CYST EXCISION  2020   REDUCTION MAMMAPLASTY     REMOVAL OF TISSUE EXPANDER AND PLACEMENT OF IMPLANT Bilateral 12/25/2022   Procedure: REMOVAL OF BILATERAL BREAST TISSUE EXPANDERS;  Surgeon: Teretha Ferguson, MD;  Location: [facility] OR;  Service: Plastics;  Laterality: Bilateral;   SIMPLE MASTECTOMY WITH AXILLARY SENTINEL NODE BIOPSY Bilateral 12/23/2022   Procedure: BILATERAL SIMPLE MASTECTOMY;  Surgeon: Caralyn Chandler, MD;  Location: [facility] OR;  Service: General;  Laterality: Bilateral;  PEC BLOCK   TONSILLECTOMY N/A 10/07/2013   Procedure: TONSILLECTOMY;  Surgeon: Virgina Grills, MD;  Location: [facility] OR;  Service: ENT;  Laterality: N/A;   TUMOR REMOVAL     tumor removed from back   Family History  Problem Relation Age of Onset   Breast cancer Mother    Clotting disorder Mother    Diabetes Mother    Heart disease Mother    Breast cancer Maternal Grandmother    Diabetes Maternal Grandmother    Prostate cancer Maternal Grandfather        metastatic   Pancreatic cancer Maternal Grandfather    Colon cancer Maternal Grandfather    Diabetes Maternal Grandfather    Breast cancer Maternal Aunt 36       metastatic   Cancer Maternal Aunt        either breast or ovarian    Cancer Maternal Uncle        unk type   Colon polyps Neg Hx    Stomach cancer Neg Hx    Esophageal cancer Neg Hx    Social History   Tobacco Use   Smoking status: Former    Current packs/day: 0.00    Average packs/day: 0.3 packs/day for 1 year (0.3 ttl pk-yrs)    Types: Cigarettes    Start date: 07/02/2012    Quit date: 07/02/2013    Years since quitting: 10.0   Smokeless tobacco: Never  Vaping Use   Vaping status: Never Used  Substance Use Topics   Alcohol use: Not Currently   Drug use: Not Currently    Types: Marijuana    Comment:  last used January 3   Current Outpatient Medications  Medication Sig Dispense Refill   acetaminophen  (TYLENOL ) 500 MG tablet Take 1,000 mg by mouth every 6 (six) hours as needed for moderate pain (pain score 4-6).     albuterol  (PROVENTIL ) (2.5 MG/3ML) 0.083% nebulizer solution Take 2.5 mg by nebulization every 6 (six) hours as needed for wheezing or shortness of breath.     alprazolam  (XANAX ) 2 MG tablet Take 2 mg by mouth every 6 (six) hours as needed for anxiety.     diazepam  (VALIUM ) 5 MG tablet Take 7.5 tablets by mouth at bedtime.     escitalopram  (LEXAPRO ) 20 MG tablet TAKE ONE TABLET BY MOUTH ONCE A DAY FOR DEPRESSION AND  ANXIETY     gabapentin  (NEURONTIN ) 300 MG capsule Take 300 mg by mouth 3 (three) times daily.     lamoTRIgine  (LAMICTAL ) 200 MG tablet Take 200 mg by mouth 2 (two) times daily.     levETIRAcetam  (KEPPRA ) 1000 MG tablet Take 1 tablet by mouth 2 (two) times daily.     methocarbamol  (ROBAXIN ) 750 MG tablet Take 750 mg by mouth 3 (three) times daily.     mirtazapine  (REMERON ) 30 MG tablet Take 30 mg by mouth at bedtime. PTSD, night terrors     naloxone  (NARCAN ) nasal spray 4 mg/0.1 mL Place 1 spray into the nose once.     omeprazole  (PRILOSEC) 20 MG capsule Take 1 capsule (20 mg total) by mouth daily. (Patient taking differently: Take 20 mg by mouth daily before breakfast.) 60 capsule 1   ondansetron  (ZOFRAN ) 8 MG tablet Take 8 mg by mouth 3 (three) times daily.     ondansetron  (ZOFRAN -ODT) 4 MG disintegrating tablet Take 1 tablet (4 mg total) by mouth every 4 (four) hours as needed for nausea or vomiting. (Patient taking differently: Take 4 mg by mouth every 4 (four) hours as needed (for breakthrough nausea symptoms).) 30 tablet 1   oxyCODONE -acetaminophen  (PERCOCET) 10-325 MG tablet Take 1 tablet by mouth every 6 (six) hours as needed for pain.     pravastatin (PRAVACHOL) 10 MG tablet Take 10 mg by mouth daily.     promethazine  (PHENERGAN ) 25 MG tablet Take 1 tablet (25 mg  total) by mouth every 8 (eight) hours as needed for nausea or vomiting. (Patient taking differently: Take 25 mg by mouth at bedtime.) 30 tablet 2   scopolamine  (TRANSDERM-SCOP) 1 MG/3DAYS Place 1 patch onto the skin every three (3) days as needed (nausea/vomiting).     SUMAtriptan  (IMITREX ) 6 MG/0.5ML SOSY injection INJECT 6MG  (ONE SINGLE DOSE AUTOINJECTOR) SUBCUTANEOUSLY TWICE A DAY AS NEEDED FOR MIGRAINE     VENTOLIN  HFA 108 (90 Base) MCG/ACT inhaler Inhale 2 puffs into the lungs every 4 (four) hours as needed for wheezing or shortness of breath.     Vitamin D , Ergocalciferol , (DRISDOL ) 1.25 MG (50000 UNIT) CAPS capsule Take 50,000 Units by mouth once a week. On Mondays     No current facility-administered medications for this visit.   Allergies  Allergen Reactions   Mushroom Extract Complex (Obsolete) Anaphylaxis and Hives   Other Anaphylaxis and Hives    Mushrooms   Nsaids Hives, Swelling and Other (See Comments)    Body burns Tolerated celecoxib  09/2020    Asa [Aspirin] Hives   Prenatal Vitamins Nausea And Vomiting and Other (See Comments)    Body did not tolerated any type per patient   Compazine  [Prochlorperazine ] Anxiety   Reglan  [Metoclopramide ] Anxiety   Sulfa Antibiotics Hives, Itching and Other (See Comments)    Teary eyes/itching throat/anxiety     Review of Systems: All systems reviewed and negative except where noted in HPI.    No results found.  Physical Exam: BP (!) 180/120 Comment: pt had a boyh breast removed and she is in in pain today  Pulse 89   Ht 5' 5 (1.651 m)   Wt 183 lb (83 kg)   BMI 30.45 kg/m  Constitutional: Pleasant,well-developed, African American female in no acute distress. HEENT: Normocephalic and atraumatic. Conjunctivae are normal. No scleral icterus. Neck supple.  Cardiovascular: Normal rate, regular rhythm.  Pulmonary/chest: Effort normal and breath sounds normal. No wheezing, rales or rhonchi. Abdominal: Soft, nondistended,  diffuse tenderness to light  palpation throughout the abdomen without rigidity or guarding. Bowel sounds active throughout. There are no masses palpable. No hepatomegaly. Extremities: no edema Lymphadenopathy: No cervical adenopathy noted. Neurological: Alert and oriented to person place and time. Skin: Skin is warm and dry. No rashes noted. Psychiatric: Normal mood and affect. Behavior is normal.  CBC    Component Value Date/Time   WBC 5.9 06/23/2023 1659   RBC 4.28 06/23/2023 1659   HGB 12.5 06/23/2023 1659   HGB 10.8 (L) 08/25/2020 1017   HCT 37.4 06/23/2023 1659   HCT 32.8 (L) 08/25/2020 1017   PLT 247 06/23/2023 1659   PLT 182 08/25/2020 1017   MCV 87.4 06/23/2023 1659   MCV 91 08/25/2020 1017   MCH 29.2 06/23/2023 1659   MCHC 33.4 06/23/2023 1659   RDW 12.4 06/23/2023 1659   RDW 12.7 08/25/2020 1017   LYMPHSABS 2.5 01/20/2023 1633   LYMPHSABS 2.3 04/14/2020 1100   MONOABS 0.6 01/20/2023 1633   EOSABS 0.1 01/20/2023 1633   EOSABS 0.1 04/14/2020 1100   BASOSABS 0.1 01/20/2023 1633   BASOSABS 0.1 04/14/2020 1100    CMP     Component Value Date/Time   NA 139 06/23/2023 1659   K 3.9 06/23/2023 1659   CL 106 06/23/2023 1659   CO2 21 (L) 06/23/2023 1659   GLUCOSE 95 06/23/2023 1659   BUN 13 06/23/2023 1659   CREATININE 1.06 (H) 06/23/2023 1659   CALCIUM  9.6 06/23/2023 1659   PROT 7.4 06/23/2023 1659   ALBUMIN  3.9 06/23/2023 1659   AST 16 06/23/2023 1659   ALT 11 06/23/2023 1659   ALKPHOS 71 06/23/2023 1659   BILITOT 0.3 06/23/2023 1659   GFRNONAA >60 06/23/2023 1659   GFRAA >60 02/08/2019 1418       Latest Ref Rng & Units 06/23/2023    4:59 PM 01/23/2023    9:22 AM 01/22/2023    7:02 AM  CBC EXTENDED  WBC 4.0 - 10.5 K/uL 5.9  7.3  13.3   RBC 3.87 - 5.11 MIL/uL 4.28  3.47  3.49   Hemoglobin 12.0 - 15.0 g/dL 16.1  9.9  09.6   HCT 04.5 - 46.0 % 37.4  30.5  30.0   Platelets 150 - 400 K/uL 247  281  247       ASSESSMENT AND PLAN:  38 year old female with  IBS, PTSD, anxiety/depression and seizure disorder presenting for ongoing abdominal pain, nausea/vomiting and reported melena and hematochezia.  FOBT negative and recent Hgb normal.  Low suspicion for clinically significant bleeding with normal hgb  Gastrointestinal bleeding Chronic GI bleeding with tarry and red blood in stool. Hemoglobin normal. Low suspicion for C. diff. - Order fecal calprotectin test. - Repeat blood count.  Constipation Chronic constipation with hard, mushy stools. Current fiber intake includes Metamucil and dietary fiber. - Continue Metamucil and dietary fiber. - Consider low FODMAP diet.  Abdominal pain Chronic periumbilical and right lower quadrant pain, possibly stress-related. - Consider low FODMAP diet. - Order blood tests for celiac disease and alpha-gal syndrome.  Nausea and vomiting Worsening nausea and vomiting, possibly stress-related. Current medications include Lexapro  and Lamictal . - Consider dietary modifications including lean protein and low FODMAP diet.  Anxiety Anxiety potentially contributing to GI symptoms. Current medications include Lexapro  and Lamictal .  Mastectomy and breast tumor Bilateral mastectomy due to nipple bleeding and tumor. New lumps post-mastectomy require biopsy.  Brain tumor Ongoing management of brain tumor.  Recording duration: 14 minutes  [facility]

## 2023-08-01 LAB — ALPHA-GAL PANEL
Allergen, Mutton, f88: <0.1 kU/L
Allergen, Pork, f26: <0.1 kU/L
Beef: <0.1 kU/L
CLASS: 0
CLASS: 0
Class: 0
GALACTOSE-ALPHA-1,3-GALACTOSE IGE*: <0.1 kU/L (ref <0.10)

## 2023-08-01 LAB — IGA: Immunoglobulin A: 120 mg/dL (ref 47–310)

## 2023-08-01 LAB — TISSUE TRANSGLUTAMINASE, IGA: (tTG) Ab, IgA: <1 U/mL

## 2023-08-01 LAB — INTERPRETATION:

## 2023-08-06 ENCOUNTER — Ambulatory Visit: Payer: Self-pay | Admitting: Gastroenterology

## 2023-08-06 NOTE — Progress Notes (Signed)
 Leah Shea,  Your tests for celiac disease and alpha gal syndrome were normal.  Your blood counts were also normal.  As discussed, your chronic GI symptoms are most likely related to an underlying gut-brain axis disorder which is typically worsened with stress and anxiety.   I would recommend trying the low FODMAP diet for at least 4-6 weeks to see if this reduces some of your symptoms.

## 2023-08-29 ENCOUNTER — Other Ambulatory Visit: Payer: Self-pay

## 2023-08-29 ENCOUNTER — Emergency Department (HOSPITAL_COMMUNITY)
Admission: EM | Admit: 2023-08-29 | Discharge: 2023-08-30 | Disposition: A | Discharge status: Home / Self Care | Attending: Emergency Medicine | Admitting: Emergency Medicine

## 2023-08-29 ENCOUNTER — Encounter (HOSPITAL_COMMUNITY): Payer: Self-pay | Admitting: Pharmacy Technician

## 2023-08-29 DIAGNOSIS — R109 Unspecified abdominal pain: Secondary | ICD-10-CM | POA: Diagnosis present

## 2023-08-29 DIAGNOSIS — N390 Urinary tract infection, site not specified: Secondary | ICD-10-CM | POA: Insufficient documentation

## 2023-08-29 LAB — CBC WITH DIFFERENTIAL/PLATELET
Abs Immature Granulocytes: 0.03 K/uL (ref 0.00–0.07)
Basophils Absolute: 0.1 K/uL (ref 0.0–0.1)
Basophils Relative: 1 %
Eosinophils Absolute: 0.1 K/uL (ref 0.0–0.5)
Eosinophils Relative: 1 %
HCT: 38.6 % (ref 36.0–46.0)
Hemoglobin: 13.1 g/dL (ref 12.0–15.0)
Immature Granulocytes: 0 %
Lymphocytes Relative: 28 %
Lymphs Abs: 2.7 K/uL (ref 0.7–4.0)
MCH: 29.8 pg (ref 26.0–34.0)
MCHC: 33.9 g/dL (ref 30.0–36.0)
MCV: 87.9 fL (ref 80.0–100.0)
Monocytes Absolute: 0.6 K/uL (ref 0.1–1.0)
Monocytes Relative: 6 %
Neutro Abs: 6.3 K/uL (ref 1.7–7.7)
Neutrophils Relative %: 64 %
Platelets: 270 K/uL (ref 150–400)
RBC: 4.39 MIL/uL (ref 3.87–5.11)
RDW: 12.3 % (ref 11.5–15.5)
WBC: 9.8 K/uL (ref 4.0–10.5)
nRBC: 0 % (ref 0.0–0.2)

## 2023-08-29 LAB — COMPREHENSIVE METABOLIC PANEL WITH GFR
ALT: 13 U/L (ref 0–44)
AST: 13 U/L — ABNORMAL LOW (ref 15–41)
Albumin: 3.8 g/dL (ref 3.5–5.0)
Alkaline Phosphatase: 92 U/L (ref 38–126)
Anion gap: 12 (ref 5–15)
BUN: 11 mg/dL (ref 6–20)
CO2: 22 mmol/L (ref 22–32)
Calcium: 9.7 mg/dL (ref 8.9–10.3)
Chloride: 106 mmol/L (ref 98–111)
Creatinine, Ser: 0.93 mg/dL (ref 0.44–1.00)
GFR, Estimated: >60 mL/min (ref >60)
Glucose, Bld: 109 mg/dL — ABNORMAL HIGH (ref 70–99)
Potassium: 3.9 mmol/L (ref 3.5–5.1)
Sodium: 140 mmol/L (ref 135–145)
Total Bilirubin: 0.7 mg/dL (ref 0.0–1.2)
Total Protein: 7.6 g/dL (ref 6.5–8.1)

## 2023-08-29 LAB — URINALYSIS, ROUTINE W REFLEX MICROSCOPIC
Bilirubin Urine: NEGATIVE
Glucose, UA: NEGATIVE mg/dL
Ketones, ur: NEGATIVE mg/dL
Nitrite: POSITIVE — AB
Protein, ur: 100 mg/dL — AB
Specific Gravity, Urine: 1.012 (ref 1.005–1.030)
WBC, UA: >50 WBC/hpf (ref 0–5)
pH: 5 (ref 5.0–8.0)

## 2023-08-29 LAB — HCG, SERUM, QUALITATIVE: Preg, Serum: NEGATIVE

## 2023-08-29 MED ORDER — OXYCODONE-ACETAMINOPHEN 5-325 MG PO TABS
1.0000 | ORAL_TABLET | Freq: Once | ORAL | Status: AC
  Administered 2023-08-29: 1 via ORAL
  Filled 2023-08-29: qty 1

## 2023-08-29 NOTE — ED Triage Notes (Signed)
 Pt with R flank and R back pain since yesterday. Pt appears uncomfortable.

## 2023-08-29 NOTE — ED Provider Triage Note (Signed)
 Emergency Medicine Provider Triage Evaluation Note  Leah Shea , a 38 y.o. female  was evaluated in triage.  Pt complains of right flank pain x yesterday, tried taking AZO but no improvement. Sharp pain to her bladder and saw tissue come out of her urine. Under pain management.   Review of Systems  Positive: Flank pain, nausea, chills Negative: Fever,   Physical Exam  BP (!) 143/96 (BP Location: Right Arm)   Pulse 86   Temp 98.5 F (36.9 C)   Resp 18   SpO2 98%  Gen:   Awake, no distress   Resp:  Normal effort  MSK:   Moves extremities without difficulty  Other:  R CVA tenderness  Medical Decision Making  Medically screening exam initiated at 2:20 PM.  Appropriate orders placed.  Leah Shea was informed that the remainder of the evaluation will be completed by another provider, this initial triage assessment does not replace that evaluation, and the importance of remaining in the ED until their evaluation is complete.     Tryson Lumley, PA-C 08/29/23 1421

## 2023-08-29 NOTE — ED Notes (Signed)
 Pt states that her chest pain is getting worse and she feels drained. Triage RN made aware.

## 2023-08-30 ENCOUNTER — Emergency Department (HOSPITAL_COMMUNITY)

## 2023-08-30 MED ORDER — CEPHALEXIN 500 MG PO CAPS: 500.0000 mg | ORAL_CAPSULE | Freq: Two times a day (BID) | ORAL | 0 refills | Status: DC

## 2023-08-30 MED ORDER — MORPHINE SULFATE (PF) 4 MG/ML IV SOLN
4.0000 mg | Freq: Once | INTRAVENOUS | Status: AC
  Administered 2023-08-30: 4 mg via INTRAMUSCULAR
  Filled 2023-08-30: qty 1

## 2023-08-30 MED ORDER — CEPHALEXIN 250 MG PO CAPS
500.0000 mg | ORAL_CAPSULE | Freq: Once | ORAL | Status: AC
  Administered 2023-08-30: 500 mg via ORAL
  Filled 2023-08-30: qty 2

## 2023-08-30 MED ORDER — ONDANSETRON 4 MG PO TBDP: 4.0000 mg | ORAL_TABLET | Freq: Three times a day (TID) | ORAL | 0 refills | Status: DC | PRN

## 2023-08-30 MED ORDER — OXYCODONE-ACETAMINOPHEN 5-325 MG PO TABS
1.0000 | ORAL_TABLET | Freq: Once | ORAL | Status: AC
  Administered 2023-08-30: 1 via ORAL
  Filled 2023-08-30: qty 1

## 2023-08-30 MED ORDER — ONDANSETRON 4 MG PO TBDP
4.0000 mg | ORAL_TABLET | Freq: Once | ORAL | Status: AC
  Administered 2023-08-30: 4 mg via ORAL
  Filled 2023-08-30: qty 1

## 2023-08-30 NOTE — ED Notes (Signed)
 Pt made sort tech aware she was having more pressure in her abdomen and more pain. Pt brought to triage and appeared very uncomfortable and in distress. Pt was bladder scanned by this tech and had urine. RN notified.

## 2023-08-30 NOTE — ED Notes (Signed)
 Pt is now actively vomiting in triage, RN notified.

## 2023-08-30 NOTE — ED Provider Notes (Signed)
 [place] [facility] AT [facility] Provider Note   CSN: [id number] Arrival date & time: 08/29/23  1346     Patient presents with: Back Pain and Flank Pain (/)   Leah Shea is a 38 y.o. female who presents today with right flank and right back pain that began yesterday.  Patient reports that pain was initially intermittent but is now constant.  Patient also reports suprapubic pain, chills, dysuria, mild hematuria, nausea and urgency.  Patient denies vomiting, fever, chest pain, shortness of breath, diarrhea, constipation, or any other complaints at this time.    Back Pain Associated symptoms: dysuria   Flank Pain       Prior to Admission medications   Medication Sig Start Date End Date Taking? Authorizing Provider  cephALEXin  (KEFLEX ) 500 MG capsule Take 1 capsule (500 mg total) by mouth 2 (two) times daily. 08/30/23  Yes Francis Ileana SAILOR, PA-C  ondansetron  (ZOFRAN -ODT) 4 MG disintegrating tablet Take 1 tablet (4 mg total) by mouth every 8 (eight) hours as needed for nausea or vomiting. 08/30/23  Yes Idalis Hoelting N, PA-C  acetaminophen  (TYLENOL ) 500 MG tablet Take 1,000 mg by mouth every 6 (six) hours as needed for moderate pain (pain score 4-6).    [provider]  albuterol  (PROVENTIL ) (2.5 MG/3ML) 0.083% nebulizer solution Take 2.5 mg by nebulization every 6 (six) hours as needed for wheezing or shortness of breath. 03/29/11   [provider]  alprazolam  (XANAX ) 2 MG tablet Take 2 mg by mouth every 6 (six) hours as needed for anxiety. 11/12/22   [provider]  diazepam  (VALIUM ) 5 MG tablet Take 7.5 tablets by mouth at bedtime. 02/03/23   [provider]  escitalopram  (LEXAPRO ) 20 MG tablet TAKE ONE TABLET BY MOUTH ONCE A DAY FOR DEPRESSION AND ANXIETY 11/10/20   [provider]  gabapentin  (NEURONTIN ) 300 MG capsule Take 300 mg by mouth 3 (three) times daily. 01/06/23   [provider]  lamoTRIgine   (LAMICTAL ) 200 MG tablet Take 200 mg by mouth 2 (two) times daily. 06/03/21   [provider]  levETIRAcetam  (KEPPRA ) 1000 MG tablet Take 1 tablet by mouth 2 (two) times daily. 04/24/23   [provider]  methocarbamol  (ROBAXIN ) 750 MG tablet Take 750 mg by mouth 3 (three) times daily. 01/06/23   [provider]  mirtazapine  (REMERON ) 30 MG tablet Take 30 mg by mouth at bedtime. PTSD, night terrors 06/08/23   [provider]  naloxone  (NARCAN ) nasal spray 4 mg/0.1 mL Place 1 spray into the nose once. 04/24/23   [provider]  omeprazole  (PRILOSEC) 20 MG capsule Take 1 capsule (20 mg total) by mouth daily. Patient taking differently: Take 20 mg by mouth daily before breakfast. 05/05/21   Stacia Glendia BRAVO, MD  ondansetron  (ZOFRAN ) 8 MG tablet Take 8 mg by mouth 3 (three) times daily. 01/06/23   [provider]  ondansetron  (ZOFRAN -ODT) 4 MG disintegrating tablet Take 1 tablet (4 mg total) by mouth every 4 (four) hours as needed for nausea or vomiting. Patient taking differently: Take 4 mg by mouth every 4 (four) hours as needed (for breakthrough nausea symptoms). 09/22/21   Marylen Aleck HERO, CNM  oxyCODONE -acetaminophen  (PERCOCET) 10-325 MG tablet Take 1 tablet by mouth every 6 (six) hours as needed for pain.    [provider]  pravastatin (PRAVACHOL) 10 MG tablet Take 10 mg by mouth daily. 06/08/23   [provider]  promethazine  (PHENERGAN ) 25 MG tablet  Take 1 tablet (25 mg total) by mouth every 8 (eight) hours as needed for nausea or vomiting. Patient taking differently: Take 25 mg by mouth at bedtime. 09/22/21   Marylen Aleck HERO, CNM  scopolamine  (TRANSDERM-SCOP) 1 MG/3DAYS Place 1 patch onto the skin every three (3) days as needed (nausea/vomiting).    [provider]  SUMAtriptan  (IMITREX ) 6 MG/0.5ML SOSY injection INJECT 6MG  (ONE SINGLE DOSE AUTOINJECTOR) SUBCUTANEOUSLY TWICE A DAY AS NEEDED FOR MIGRAINE 06/03/21    [provider]  VENTOLIN  HFA 108 (90 Base) MCG/ACT inhaler Inhale 2 puffs into the lungs every 4 (four) hours as needed for wheezing or shortness of breath. 10/27/22   [provider]  Vitamin D , Ergocalciferol , (DRISDOL ) 1.25 MG (50000 UNIT) CAPS capsule Take 50,000 Units by mouth once a week. On Mondays 09/22/22   [provider]    Allergies: Mushroom extract complex (obsolete), Other, Nsaids, Asa [aspirin], Prenatal vitamins, Compazine  [prochlorperazine ], Reglan  [metoclopramide ], and Sulfa antibiotics    Review of Systems  Constitutional:  Positive for chills.  Gastrointestinal:  Positive for nausea.  Genitourinary:  Positive for dysuria, flank pain, hematuria and urgency.  Musculoskeletal:  Positive for back pain.    Updated Vital Signs BP (!) 171/89 (BP Location: Right Arm)   Pulse 88   Temp 98.2 F (36.8 C) (Oral)   Resp 17   SpO2 97%   Physical Exam Vitals and nursing note reviewed.  Constitutional:      General: She is not in acute distress.    Appearance: She is well-developed.     Comments: Uncomfortable appearing  HENT:     Head: Normocephalic and atraumatic.     Right Ear: External ear normal.     Left Ear: External ear normal.  Eyes:     Extraocular Movements: Extraocular movements intact.     Conjunctiva/sclera: Conjunctivae normal.  Cardiovascular:     Rate and Rhythm: Normal rate and regular rhythm.     Pulses: Normal pulses.     Heart sounds: Normal heart sounds. No murmur heard. Pulmonary:     Effort: Pulmonary effort is normal. No respiratory distress.     Breath sounds: Normal breath sounds.  Abdominal:     General: Bowel sounds are normal. There is no distension.     Palpations: Abdomen is soft.     Tenderness: There is abdominal tenderness in the suprapubic area. There is right CVA tenderness. There is no left CVA tenderness.  Musculoskeletal:        General: No swelling.     Cervical back: Neck supple.  Skin:     General: Skin is warm and dry.     Capillary Refill: Capillary refill takes less than 2 seconds.  Neurological:     General: No focal deficit present.     Mental Status: She is alert and oriented to person, place, and time.  Psychiatric:        Mood and Affect: Mood normal.     (all labs ordered are listed, but only abnormal results are displayed) Labs Reviewed  COMPREHENSIVE METABOLIC PANEL WITH GFR - Abnormal; Notable for the following components:      Result Value   Glucose, Bld 109 (*)    AST 13 (*)    All other components within normal limits  URINALYSIS, ROUTINE W REFLEX MICROSCOPIC - Abnormal; Notable for the following components:   Color, Urine AMBER (*)    APPearance CLOUDY (*)    Hgb urine dipstick MODERATE (*)  Protein, ur 100 (*)    Nitrite POSITIVE (*)    Leukocytes,Ua MODERATE (*)    Bacteria, UA MANY (*)    Non Squamous Epithelial 0-5 (*)    All other components within normal limits  URINE CULTURE  CBC WITH DIFFERENTIAL/PLATELET  HCG, SERUM, QUALITATIVE    EKG: EKG Interpretation Date/Time:  Tuesday August 29 2023 14:07:47 EDT Ventricular Rate:  81 PR Interval:  142 QRS Duration:  72 QT Interval:  374 QTC Calculation: 434 R Axis:   67  Text Interpretation: Sinus rhythm with marked sinus arrhythmia Confirmed by Palumbo, April ([id number]) on 08/30/2023 4:35:07 AM  Radiology: CT Renal Stone Study Result Date: 08/30/2023 CLINICAL DATA:  Abdominal/flank pain.  Evaluate for kidney stone. EXAM: CT ABDOMEN AND PELVIS WITHOUT CONTRAST TECHNIQUE: Multidetector CT imaging of the abdomen and pelvis was performed following the standard protocol without IV contrast. RADIATION DOSE REDUCTION: This exam was performed according to the departmental dose-optimization program which includes automated exposure control, adjustment of the mA and/or kV according to patient size and/or use of iterative reconstruction technique. COMPARISON:  06/24/2023 FINDINGS: Lower chest: No acute  abnormality. There are a few tiny peripheral tree-in-bud nodules with mild scar noted in the posterolateral left base. No pleural effusion or significant consolidative change. Hepatobiliary: No focal liver abnormality is seen. No gallstones, gallbladder wall thickening, or biliary dilatation. Pancreas: Unremarkable. No pancreatic ductal dilatation or surrounding inflammatory changes. Spleen: Normal in size without focal abnormality. Adrenals/Urinary Tract: Normal adrenal glands. No nephrolithiasis, hydronephrosis or mass identified bilaterally. No hydroureter or ureteral lithiasis. Urinary bladder appears normal. Stomach/Bowel: Small hiatal hernia. The appendix is visualized and appears normal. No pathologic dilatation of the large or small bowel loops. Colonic diverticulosis. No signs of acute diverticulitis. Vascular/Lymphatic: No significant vascular findings are present. No enlarged abdominal or pelvic lymph nodes. Reproductive: Uterus and bilateral adnexa are unremarkable. Other: No free fluid or fluid collections. No signs of pneumoperitoneum. Musculoskeletal: No acute or significant osseous findings. IMPRESSION: 1. No acute findings within the abdomen or pelvis. 2. No evidence for nephrolithiasis or hydronephrosis. 3. Colonic diverticulosis without signs of acute diverticulitis. 4. Small hiatal hernia. Electronically Signed   By: Waddell Calk M.D.   On: 08/30/2023 05:24     Procedures   Medications Ordered in the ED  oxyCODONE -acetaminophen  (PERCOCET/ROXICET) 5-325 MG per tablet 1 tablet (1 tablet Oral Given 08/29/23 1641)  ondansetron  (ZOFRAN -ODT) disintegrating tablet 4 mg (4 mg Oral Given 08/30/23 0445)  morphine  (PF) 4 MG/ML injection 4 mg (4 mg Intramuscular Given 08/30/23 0446)  oxyCODONE -acetaminophen  (PERCOCET/ROXICET) 5-325 MG per tablet 1 tablet (1 tablet Oral Given 08/30/23 0539)  cephALEXin  (KEFLEX ) capsule 500 mg (500 mg Oral Given 08/30/23 0543)    Clinical Course as of 08/30/23 0545   Tue Aug 29, 2023  1635 Bacteria, UA(!): MANY [JS]  1635 Nitrite(!): POSITIVE [JS]  [address]): MODERATE [JS]    Clinical Course User Index [JS] Soto, Johana, PA-C                                 Medical Decision Making  This patient presents to the ED for concern of right flank pain and back pain differential diagnosis includes kidney stone, pyelonephritis, UTI, sepsis    Additional history obtained   Additional history obtained from Electronic Medical Record External records from outside source obtained and reviewed including Care Everywhere   Lab Tests:  I Ordered, and personally interpreted  labs.  The pertinent results include: CBC WNL, CMP with mildly decreased AST 13, pregnancy negative, UA with moderate hemoglobin, moderate leukocytes, nitrate positive, 100 protein, many bacteria   Imaging Studies ordered:  I ordered imaging studies including CT renal stone study I independently visualized and interpreted imaging which showed no acute findings in abdomen or pelvis.  No evidence of nephrolithiasis or hydronephrosis.  Colonic diverticulosis without acute diverticulitis.  Small hiatal hernia. I agree with the radiologist interpretation EKG was sinus rhythm and marked sinus arrhythmia   Medicines ordered and prescription drug management:  I ordered medication including morphine  and Zofran , percocet I have reviewed the patients home medicines and have made adjustments as needed   Problem List / ED Course:  Considered for admission or further workup however patient's vital signs, physical exam, labs, and imaging are reassuring.  Patient has no signs or symptoms concerning for pyelonephritis or septic kidney stone.  Patient symptoms likely due to lower urinary tract infection.  Patient given first dose of Keflex  while in ED.  Patient given outpatient course of Keflex  and Zofran  as needed for nausea and vomiting.  Patient given return precautions.  I feel  patient is safe for discharge at this time.       Final diagnoses:  Urinary tract infection with hematuria, site unspecified    ED Discharge Orders          Ordered    cephALEXin  (KEFLEX ) 500 MG capsule  2 times daily        08/30/23 0540    ondansetron  (ZOFRAN -ODT) 4 MG disintegrating tablet  Every 8 hours PRN        08/30/23 0543               Ares Tegtmeyer N, PA-C 08/30/23 0545    Palumbo, April, MD 08/30/23 [phone]

## 2023-08-30 NOTE — Discharge Instructions (Addendum)
 Today you were seen for a urinary tract infection.  Please pick up your medication and take as prescribed.  You should also take Tylenol  every 6 hours.  Please return to the ED if you have uncontrollable vomiting, fever that does not go down with Tylenol  or Motrin, or worsening pain.  Thank you for letting us  treat you today. After reviewing your labs and imaging, I feel you are safe to go home. Please follow up with your PCP in the next several days and provide them with your records from this visit. Return to the Emergency Room if pain becomes severe or symptoms worsen.

## 2023-08-30 NOTE — ED Notes (Signed)
 Patient transported to CT

## 2023-08-31 LAB — URINE CULTURE: Culture: >=100000 — AB

## 2023-09-01 ENCOUNTER — Telehealth (HOSPITAL_BASED_OUTPATIENT_CLINIC_OR_DEPARTMENT_OTHER): Payer: Self-pay

## 2023-09-01 NOTE — Telephone Encounter (Signed)
 Post ED Visit - Positive Culture Follow-up  Culture report reviewed by antimicrobial stewardship pharmacist: [facility] [x]  Leonor Bash, [place].D. []  Venetia Gully, Pharm.D., BCPS AQ-ID []  Garrel Crews, Pharm.D., BCPS []  Almarie Lunger, [address].D., BCPS []  [place], [address].D., BCPS, AAHIVP []  Rosaline Bihari, Pharm.D., BCPS, AAHIVP []  Vernell Meier, PharmD, BCPS []  Latanya Hint, PharmD, BCPS []  Donald Medley, PharmD, BCPS []  Rocky Bold, PharmD []  Dorothyann Alert, PharmD, BCPS []  Morene Babe, PharmD  [facility] []  Rosaline Edison, PharmD []  Romona Bliss, PharmD []  Dolphus Roller, PharmD []  Veva Seip, Rph []  Vernell Daunt) Leonce, PharmD []  Eva Allis, PharmD []  Rosaline Millet, PharmD []  Iantha Batch, PharmD []  Arvin Gauss, PharmD []  Wanda Hasting, PharmD []  Ronal Rav, PharmD []  Rocky Slade, PharmD []  Bard Jeans, PharmD   Positive urine culture Treated with Cephalexin , organism sensitive to the same and no further patient follow-up is required at this time.  Ruth Camelia Elbe 09/01/2023, 12:37 PM

## 2023-12-05 ENCOUNTER — Ambulatory Visit: Attending: Internal Medicine | Admitting: Internal Medicine

## 2023-12-05 ENCOUNTER — Encounter: Payer: Self-pay | Admitting: Internal Medicine

## 2023-12-05 VITALS — BP 138/91 | HR 90 | Ht 65.5 in | Wt 205.0 lb

## 2023-12-05 DIAGNOSIS — R079 Chest pain, unspecified: Secondary | ICD-10-CM | POA: Diagnosis not present

## 2023-12-05 DIAGNOSIS — E221 Hyperprolactinemia: Secondary | ICD-10-CM

## 2023-12-05 DIAGNOSIS — I10 Essential (primary) hypertension: Secondary | ICD-10-CM | POA: Diagnosis not present

## 2023-12-05 MED ORDER — METOPROLOL TARTRATE 50 MG PO TABS: 50.0000 mg | ORAL_TABLET | Freq: Once | ORAL | 0 refills | Status: AC

## 2023-12-05 NOTE — Patient Instructions (Addendum)
 Medication Instructions:  ONCE Metoprolol Tartrate (Lopressor) 50mg  2hrs before Coronary CTA  *If you need a refill on your cardiac medications before your next appointment, please call your pharmacy*  Lab Work: To be completed today: BMP and Aldosterone+ renin activity   If you have labs (blood work) drawn today and your tests are completely normal, you will receive your results only by: MyChart Message (if you have MyChart) OR A paper copy in the mail If you have any lab test that is abnormal or we need to change your treatment, we will call you to review the results.  Testing/Procedures: Your physician has requested that you have an echocardiogram at next available appointment slot. Echocardiography is a painless test that uses sound waves to create images of your heart. It provides your doctor with information about the size and shape of your heart and how well your heart's chambers and valves are working. This procedure takes approximately one hour. There are no restrictions for this procedure. Please do NOT wear cologne, perfume, aftershave, or lotions (deodorant is allowed). Please arrive 15 minutes prior to your appointment time.  Please note: We ask at that you not bring children with you during ultrasound (echo/ vascular) testing. Due to room size and safety concerns, children are not allowed in the ultrasound rooms during exams. Our front office staff cannot provide observation of children in our lobby area while testing is being conducted. An adult accompanying a patient to their appointment will only be allowed in the ultrasound room at the discretion of the ultrasound technician under special circumstances. We apologize for any inconvenience.   Your provider has requested that you have a coronary CTA. Non-Cardiac CT Angiography (CTA), is a special type of CT scan that uses a computer to produce multi-dimensional views of major blood vessels throughout the body. In CT angiography, a  contrast material is injected through an IV to help visualize the blood vessels   Follow-Up: At [facility], you and your health needs are our priority.  As part of our continuing mission to provide you with exceptional heart care, our providers are all part of one team.  This team includes your primary Cardiologist (physician) and Advanced Practice Providers or APPs (Physician Assistants and Nurse Practitioners) who all work together to provide you with the care you need, when you need it.  Your next appointment:   4-6 weeks  Provider:   Dr. Kriste   We recommend signing up for the patient portal called MyChart.  Sign up information is provided on this After Visit Summary.  MyChart is used to connect with patients for Virtual Visits (Telemedicine).  Patients are able to view lab/test results, encounter notes, upcoming appointments, etc.  Non-urgent messages can be sent to your provider as well.   To learn more about what you can do with MyChart, go to [email or web address].   Other Instructions Report Blood Pressure readings in 2 weeks via MyChart or you can call our office at [phone] Low salt diet Continue to use CPAP     Your cardiac CT will be scheduled at one of the below locations:   [facility] [address] [place], [place] [address] [phone]   Please follow these instructions carefully (unless otherwise directed):  An IV will be required for this test and Nitroglycerin will be given.   On the Night Before the Test: Be sure to Drink plenty of water . Do not consume any caffeinated/decaffeinated beverages or chocolate 12 hours prior to  your test. Do not take any antihistamines 12 hours prior to your test.  On the Day of the Test: Drink plenty of water  until 1 hour prior to the test. Do not eat any food 1 hour prior to test. You may take your regular medications prior to the test.  Take metoprolol (Lopressor) 50 mg two hours prior  to test. FEMALES- please wear underwire-free bra if available, avoid dresses & tight clothing      After the Test: Drink plenty of water . After receiving IV contrast, you may experience a mild flushed feeling. This is normal. On occasion, you may experience a mild rash up to 24 hours after the test. This is not dangerous. If this occurs, you can take Benadryl  25 mg and increase your fluid intake. If you experience trouble breathing, this can be serious. If it is severe call 911 IMMEDIATELY. If it is mild, please call our office.  We will call to schedule your test 2-4 weeks out understanding that some insurance companies will need an authorization prior to the service being performed.   For more information and frequently asked questions, please visit our website : [email or web address]  For non-scheduling related questions, please contact the cardiac imaging nurse navigator should you have any questions/concerns: Cardiac Imaging Nurse Navigators Direct Office Dial: [phone]   For scheduling needs, including cancellations and rescheduling, please call [place], [phone].

## 2023-12-05 NOTE — Progress Notes (Signed)
 Cardiology Office Note   Date:  12/05/2023  ID:  Ninamarie Meshon [place], Natarajan 18-Jan-1986, MRN [id number] PCP: [facility]  [facility] Providers Cardiologist:  Emeline FORBES Calender, MD     History of Present Illness Leah Shea is a 38 y.o. female who follows at the [facility] with a past medical history of breast cancer s/p bilateral mastectomy in November 2024, postmastectomy chest wall pain and seroma, hyperprolactinemia and sellar mass, OSA, intermittently compliant with CPAP, hypertension, family history of premature CAD who was referred by Clayborne Molt, PA due to concern of hypertensive emergency with a blood pressure of 159/110.  Patient states that for the past 2 months she has been noting elevated blood pressures with consistent pressures in the 150s/110s and on a prior ED visit had a systolic pressure in the 170s.  She was started on amlodipine 10 mg and lisinopril and yesterday her lisinopril was increased from 20 to 40 mg.  During that period of time she has noted chest discomfort, notably on exertion with sensation of acid reflux that goes away with rest.  She has a family history of premature CAD in mom who had an MI and stents in her 30s.  She also has had left-sided chest discomfort that has been going on since her surgery and is more related to her postop state..  Today she is also complaining of left-sided chest discomfort that is typical of her postop chest discomfort  Tobacco use: No Alcohol use: No Diet mainly consists of: Bland diet and tries to limit salt and fried foods due to food aversion Pertinent family history:  Mother type 2 diabetes and hypertension, MI at 77 with multiple stents Brother brain neoplasm Maternal grandfather type 2 diabetes and hypertension Maternal aunt breast cancer Maternal grandmother breast cancer    ROS:  Review of Systems  All other systems reviewed and are negative.   Physical Exam  Physical Exam Vitals and nursing  note reviewed.  Constitutional:      Appearance: Normal appearance.  HENT:     Head: Normocephalic and atraumatic.  Eyes:     Conjunctiva/sclera: Conjunctivae normal.  Neck:     Vascular: No carotid bruit.  Cardiovascular:     Rate and Rhythm: Normal rate and regular rhythm.  Pulmonary:     Effort: Pulmonary effort is normal.     Breath sounds: Normal breath sounds.  Musculoskeletal:        General: No swelling or tenderness.  Skin:    Coloration: Skin is not jaundiced or pale.  Neurological:     Mental Status: She is alert.     VS:  BP (!) 138/91 (BP Location: Left Arm, Patient Position: Sitting, Cuff Size: Normal)   Pulse 90   Ht 5' 5.5 (1.664 m)   Wt 205 lb (93 kg)   SpO2 95%   BMI 33.59 kg/m         Wt Readings from Last 3 Encounters:  12/05/23 205 lb (93 kg)  07/27/23 183 lb (83 kg)  06/23/23 198 lb 6.6 oz (90 kg)     EKG Interpretation Date/Time:  Tuesday December 05 2023 09:12:02 EDT Ventricular Rate:  76 PR Interval:  162 QRS Duration:  72 QT Interval:  388 QTC Calculation: 436 R Axis:   55  Text Interpretation: Normal sinus rhythm Nonspecific T wave abnormality When compared with ECG of 29-Aug-2023 14:07, No significant change was found Confirmed by Calender Emeline [phone]) on 12/05/2023 9:14:18 AM  Studies Reviewed   None     Risk Assessment/Calculations   ASCVD risk score: The ASCVD Risk score (Arnett DK, et al., 2019) failed to calculate for the following reasons:   The 2019 ASCVD risk score is only valid for ages 2 to 41   ASSESSMENT  Hypertension BP 138/91 today.  Currently on amlodipine 10 mg and lisinopril 40 mg (it was increased from 20 mg yesterday).  BP is much better controlled today compared to previously when if there was a diastolic of 110s.  Previously compliant with CPAP machine which caused night terrors and she stopped 2 months ago. Chest pain patient has 2 different types of chest pain, one is postop chest pain from her  bilateral mastectomy resulting in postop seroma and chronic chest wall pain which she follows with surgery for.  Her other chest pain is a little more concerning and is more central and described as a burning with exertion and relieved with rest.  She also has a family history of premature CAD of MI in her mother and 30s as well as poorly controlled hypertension. Breast mass s/p [facility] had [facility] positive breast [facility] and underwent bilateral mastectomies with a extensive family history of breast cancer.  She had repeat biopsies done in our health system which were benign.  Unfortunately the [facility] records are unavailable at this time.  She is not on hormone therapy for this Pituitary mass with hyperprolactinemia follows with endocrinology Migraines on triptans OSA previously compliant with CPAP that caused night terrors for her and so she stopped 2 months ago around the time her blood pressure started increasing   Plan  She was advised to use her CPAP machine consistently Will not make any adjustments to her antihypertensive regimen at this time since she just increased her lisinopril and blood pressure is improved today compared to prior readings She is going to reach out in 2 weeks with blood pressure recordings via MyChart We will check an aldosterone renin ratio Will check an echocardiogram to evaluate for LVH and her chest pain Will check a coronary CTA with beta-blocker prior and BMP  Follow up: 4 to 6 weeks          Signed, Emeline FORBES Calender, MD

## 2023-12-07 ENCOUNTER — Ambulatory Visit (HOSPITAL_COMMUNITY)
Admission: RE | Admit: 2023-12-07 | Discharge: 2023-12-07 | Disposition: A | Discharge status: Home / Self Care | Source: Ambulatory Visit | Attending: Internal Medicine | Admitting: Internal Medicine

## 2023-12-07 DIAGNOSIS — R079 Chest pain, unspecified: Secondary | ICD-10-CM | POA: Insufficient documentation

## 2023-12-07 MED ORDER — IOHEXOL 350 MG/ML SOLN
100.0000 mL | Freq: Once | INTRAVENOUS | Status: AC | PRN
  Administered 2023-12-07: 100 mL via INTRAVENOUS

## 2023-12-07 MED ORDER — NITROGLYCERIN 0.4 MG SL SUBL
0.8000 mg | SUBLINGUAL_TABLET | Freq: Once | SUBLINGUAL | Status: AC
  Administered 2023-12-07: 0.8 mg via SUBLINGUAL

## 2023-12-11 ENCOUNTER — Emergency Department (HOSPITAL_COMMUNITY)

## 2023-12-11 ENCOUNTER — Emergency Department (HOSPITAL_BASED_OUTPATIENT_CLINIC_OR_DEPARTMENT_OTHER)
Admission: EM | Admit: 2023-12-11 | Discharge: 2023-12-12 | Disposition: A | Discharge status: Home / Self Care | Attending: Emergency Medicine | Admitting: Emergency Medicine

## 2023-12-11 ENCOUNTER — Emergency Department (HOSPITAL_COMMUNITY)
Admission: EM | Admit: 2023-12-11 | Discharge: 2023-12-11 | Discharge status: Against Medical Advice | Attending: Emergency Medicine | Admitting: Emergency Medicine

## 2023-12-11 ENCOUNTER — Encounter (HOSPITAL_COMMUNITY): Payer: Self-pay

## 2023-12-11 ENCOUNTER — Other Ambulatory Visit: Payer: Self-pay

## 2023-12-11 ENCOUNTER — Encounter (HOSPITAL_BASED_OUTPATIENT_CLINIC_OR_DEPARTMENT_OTHER): Payer: Self-pay

## 2023-12-11 DIAGNOSIS — D57219 Sickle-cell/Hb-C disease with crisis, unspecified: Secondary | ICD-10-CM | POA: Diagnosis present

## 2023-12-11 DIAGNOSIS — R911 Solitary pulmonary nodule: Secondary | ICD-10-CM | POA: Insufficient documentation

## 2023-12-11 DIAGNOSIS — Z5321 Procedure and treatment not carried out due to patient leaving prior to being seen by health care provider: Secondary | ICD-10-CM | POA: Diagnosis not present

## 2023-12-11 DIAGNOSIS — R0789 Other chest pain: Secondary | ICD-10-CM | POA: Insufficient documentation

## 2023-12-11 DIAGNOSIS — R059 Cough, unspecified: Secondary | ICD-10-CM | POA: Insufficient documentation

## 2023-12-11 MED ORDER — SODIUM CHLORIDE 0.9% FLUSH
3.0000 mL | Freq: Once | INTRAVENOUS | Status: DC
  Filled 2023-12-11: qty 3

## 2023-12-11 NOTE — ED Provider Triage Note (Signed)
 Emergency Medicine Provider Triage Evaluation Note  Leah Shea , a 38 y.o. female  was evaluated in triage.  Pt complains of sickle cell pain. Endorse pain to chest, sob, R hip pain since yesterday. Pain felt similar to prior sickle cell crisis.  No fever, chills cough.  Review of Systems  Positive: As above Negative: As above  Physical Exam  BP (!) 145/107 (BP Location: Right Arm)   Pulse 96   Temp 98.1 F (36.7 C)   Resp 18   Ht 5' 5 (1.651 m)   Wt 91.2 kg   SpO2 90%   BMI 33.45 kg/m  Gen:   Awake, no distress   Resp:  Normal effort  MSK:   Moves extremities without difficulty  Other:    Medical Decision Making  Medically screening exam initiated at 5:39 PM.  Appropriate orders placed.  Leah Shea was informed that the remainder of the evaluation will be completed by another provider, this initial triage assessment does not replace that evaluation, and the importance of remaining in the ED until their evaluation is complete.     Nivia Colon, PA-C 12/11/23 1740

## 2023-12-11 NOTE — ED Triage Notes (Signed)
 Patient arrived POV from home with complaint of sickle cell crisis starting 12/10/23, reports unable to manage at home.  Reports pain in chest, lower back, ribs, and hips.   Pain 10  Last dose of home medication at 1600 reports taking 10mg  Oxycodone 

## 2023-12-11 NOTE — ED Notes (Signed)
 Pt requested to get labs drawn when placed in a pt room.

## 2023-12-11 NOTE — ED Triage Notes (Signed)
 Patient states she is having a sickle cell crisis. She states it hurts to breath and she is having chest pain.

## 2023-12-11 NOTE — ED Provider Notes (Signed)
 [facility] Provider Note   CSN: [id number] Arrival date & time: 12/11/23  2329     Patient presents with: Sickle Cell Pain Crisis   Leah Shea is a 38 y.o. female.  {Add pertinent medical, surgical, social history, OB history to YEP:67052} The history is provided by the patient and medical records.  Sickle Cell Pain Crisis Leah Shea is a 38 y.o. female who presents to the Emergency Department complaining of *** Chest pain, cough ribs and chest towards back feel like getting crushed.  Stabbing pain in hips crushing. Started last night Taking home meds. Milder sxs for two weeks.  No fevers, has chills.  Has deep cough, nonproductive.  Chest pain is worse today.  Has nausea.  No vomiting. Has abdominal pain.  No dysuria. Has swelling in both feet since yesterday.   G6PD, sickle trait. Pituitary adenmoa, mastectomy - November. Complications.  No chemo or radiation.       Prior to Admission medications   Medication Sig Start Date End Date Taking? Authorizing Provider  acetaminophen  (TYLENOL ) 500 MG tablet Take 1,000 mg by mouth every 6 (six) hours as needed for moderate pain (pain score 4-6).    [provider]  albuterol  (PROVENTIL ) (2.5 MG/3ML) 0.083% nebulizer solution Take 2.5 mg by nebulization every 6 (six) hours as needed for wheezing or shortness of breath. 03/29/11   [provider]  alprazolam  (XANAX ) 2 MG tablet Take 2 mg by mouth every 6 (six) hours as needed for anxiety. 11/12/22   [provider]  amLODipine (NORVASC) 10 MG tablet Take 10 mg by mouth daily. 11/22/23   [provider]  cephALEXin  (KEFLEX ) 500 MG capsule Take 1 capsule (500 mg total) by mouth 2 (two) times daily. 08/30/23   Keith, Kayla N, PA-C  diazepam  (VALIUM ) 5 MG tablet Take 7.5 tablets by mouth at bedtime. 02/03/23   [provider]  escitalopram  (LEXAPRO ) 20 MG tablet TAKE ONE TABLET BY MOUTH  ONCE A DAY FOR DEPRESSION AND ANXIETY 11/10/20   [provider]  gabapentin  (NEURONTIN ) 300 MG capsule Take 300 mg by mouth 3 (three) times daily. 01/06/23   [provider]  lamoTRIgine  (LAMICTAL ) 200 MG tablet Take 200 mg by mouth 2 (two) times daily. 06/03/21   [provider]  levETIRAcetam  (KEPPRA ) 1000 MG tablet Take 1 tablet by mouth 2 (two) times daily. 04/24/23   [provider]  lisinopril (ZESTRIL) 40 MG tablet Take 40 mg by mouth daily. 12/04/23   [provider]  methocarbamol  (ROBAXIN ) 750 MG tablet Take 750 mg by mouth 3 (three) times daily. 01/06/23   [provider]  metoprolol tartrate (LOPRESSOR) 50 MG tablet Take 1 tablet (50 mg total) by mouth once for 1 dose. Take 90-120 minutes prior to scan. Hold for SBP less than 110. 12/05/23 12/05/23  Kriste Emeline BRAVO, MD  mirtazapine  (REMERON ) 30 MG tablet Take 30 mg by mouth at bedtime. PTSD, night terrors 06/08/23   [provider]  naloxone  (NARCAN ) nasal spray 4 mg/0.1 mL Place 1 spray into the nose once. 04/24/23   [provider]  omeprazole  (PRILOSEC) 20 MG capsule Take 1 capsule (20 mg total) by mouth daily. Patient taking differently: Take 20 mg by mouth daily before breakfast. 05/05/21   Stacia Glendia BRAVO, MD  ondansetron  (ZOFRAN ) 8 MG tablet Take 8 mg by mouth 3 (three) times daily. 01/06/23   [provider]  ondansetron  (ZOFRAN -ODT) 4 MG  disintegrating tablet Take 1 tablet (4 mg total) by mouth every 4 (four) hours as needed for nausea or vomiting. Patient taking differently: Take 4 mg by mouth every 4 (four) hours as needed (for breakthrough nausea symptoms). 09/22/21   Marylen Aleck HERO, CNM  ondansetron  (ZOFRAN -ODT) 4 MG disintegrating tablet Take 1 tablet (4 mg total) by mouth every 8 (eight) hours as needed for nausea or vomiting. 08/30/23   Keith, Kayla N, PA-C  oxyCODONE -acetaminophen  (PERCOCET) 10-325 MG tablet Take 1 tablet by mouth every 6 (six)  hours as needed for pain.    [provider]  pravastatin (PRAVACHOL) 10 MG tablet Take 10 mg by mouth daily. 06/08/23   [provider]  promethazine  (PHENERGAN ) 25 MG tablet Take 1 tablet (25 mg total) by mouth every 8 (eight) hours as needed for nausea or vomiting. Patient taking differently: Take 25 mg by mouth at bedtime. 09/22/21   Marylen Aleck HERO, CNM  scopolamine  (TRANSDERM-SCOP) 1 MG/3DAYS Place 1 patch onto the skin every three (3) days as needed (nausea/vomiting).    [provider]  SUMAtriptan  (IMITREX ) 6 MG/0.5ML SOSY injection INJECT 6MG  (ONE SINGLE DOSE AUTOINJECTOR) SUBCUTANEOUSLY TWICE A DAY AS NEEDED FOR MIGRAINE 06/03/21   [provider]  VENTOLIN  HFA 108 (90 Base) MCG/ACT inhaler Inhale 2 puffs into the lungs every 4 (four) hours as needed for wheezing or shortness of breath. 10/27/22   [provider]  Vitamin D , Ergocalciferol , (DRISDOL ) 1.25 MG (50000 UNIT) CAPS capsule Take 50,000 Units by mouth once a week. On Mondays 09/22/22   [provider]    Allergies: Mushroom extract complex (obsolete), Other, Nsaids, Asa [aspirin], Prenatal vitamins, Compazine  [prochlorperazine ], Reglan  [metoclopramide ], and Sulfa antibiotics    Review of Systems  Updated Vital Signs BP (!) 159/110 (BP Location: Left Arm)   Pulse (!) 101   Temp 97.9 F (36.6 C)   Resp (!) 22   Ht 5' 5 (1.651 m)   Wt 91.2 kg   SpO2 (!) 88%   BMI 33.45 kg/m   Physical Exam  (all labs ordered are listed, but only abnormal results are displayed) Labs Reviewed  COMPREHENSIVE METABOLIC PANEL WITH GFR  CBC WITH DIFFERENTIAL/PLATELET  RETICULOCYTES  PREGNANCY, URINE    EKG: EKG Interpretation Date/Time:  Monday December 11 2023 23:40:53 EDT Ventricular Rate:  96 PR Interval:  158 QRS Duration:  79 QT Interval:  350 QTC Calculation: 443 R Axis:   55  Text Interpretation: Sinus rhythm Confirmed by Griselda Norris [phone]) on 12/11/2023 11:46:54  PM  Radiology: DG Chest 1 View Result Date: 12/11/2023 EXAM: 1 VIEW(S) XRAY OF THE CHEST 12/11/2023 06:02:00 PM COMPARISON: Chest x-ray 03/14/2022. CLINICAL HISTORY: 355200 Chest pain [id number]. Chest pain, sickle cell Chest pain, sickle cell. FINDINGS: LUNGS AND PLEURA: No focal pulmonary opacity. No pulmonary edema. No pleural effusion. No pneumothorax. HEART AND MEDIASTINUM: No acute abnormality of the cardiac and mediastinal silhouettes. BONES AND SOFT TISSUES: No acute osseous abnormality. IMPRESSION: 1. No acute process. Electronically signed by: Greig Pique MD 12/11/2023 06:31 PM EDT RP Workstation: [id number]    {Document cardiac monitor, telemetry assessment procedure when appropriate:32947} Procedures   Medications Ordered in the ED  sodium chloride  flush (NS) 0.9 % injection 3 mL (has no administration in time range)      {Click here for ABCD2, HEART and other calculators REFRESH Note before signing:1}  Medical Decision Making Amount and/or Complexity of Data Reviewed Labs: ordered. Radiology: ordered.   ***  {Document critical care time when appropriate  Document review of labs and clinical decision tools ie CHADS2VASC2, etc  Document your independent review of radiology images and any outside records  Document your discussion with family members, caretakers and with consultants  Document social determinants of health affecting pt's care  Document your decision making why or why not admission, treatments were needed:32947:::1}   Final diagnoses:  None    ED Discharge Orders     None

## 2023-12-11 NOTE — ED Notes (Signed)
 LWBS

## 2023-12-12 ENCOUNTER — Emergency Department (HOSPITAL_BASED_OUTPATIENT_CLINIC_OR_DEPARTMENT_OTHER)

## 2023-12-12 LAB — CBC WITH DIFFERENTIAL/PLATELET
Abs Immature Granulocytes: 0.02 K/uL (ref 0.00–0.07)
Basophils Absolute: 0.1 K/uL (ref 0.0–0.1)
Basophils Relative: 1 %
Eosinophils Absolute: 0.3 K/uL (ref 0.0–0.5)
Eosinophils Relative: 4 %
HCT: 36 % (ref 36.0–46.0)
Hemoglobin: 12.3 g/dL (ref 12.0–15.0)
Immature Granulocytes: 0 %
Lymphocytes Relative: 47 %
Lymphs Abs: 3.6 K/uL (ref 0.7–4.0)
MCH: 29.3 pg (ref 26.0–34.0)
MCHC: 34.2 g/dL (ref 30.0–36.0)
MCV: 85.7 fL (ref 80.0–100.0)
Monocytes Absolute: 0.5 K/uL (ref 0.1–1.0)
Monocytes Relative: 7 %
Neutro Abs: 3.1 K/uL (ref 1.7–7.7)
Neutrophils Relative %: 41 %
Platelets: 290 K/uL (ref 150–400)
RBC: 4.2 MIL/uL (ref 3.87–5.11)
RDW: 12.4 % (ref 11.5–15.5)
WBC: 7.6 K/uL (ref 4.0–10.5)
nRBC: 0 % (ref 0.0–0.2)

## 2023-12-12 LAB — COMPREHENSIVE METABOLIC PANEL WITH GFR
ALT: 11 U/L (ref 0–44)
AST: 15 U/L (ref 15–41)
Albumin: 4.2 g/dL (ref 3.5–5.0)
Alkaline Phosphatase: 92 U/L (ref 38–126)
Anion gap: 13 (ref 5–15)
BUN: 12 mg/dL (ref 6–20)
CO2: 23 mmol/L (ref 22–32)
Calcium: 9.6 mg/dL (ref 8.9–10.3)
Chloride: 107 mmol/L (ref 98–111)
Creatinine, Ser: 0.91 mg/dL (ref 0.44–1.00)
GFR, Estimated: >60 mL/min (ref >60)
Glucose, Bld: 104 mg/dL — ABNORMAL HIGH (ref 70–99)
Potassium: 3.8 mmol/L (ref 3.5–5.1)
Sodium: 143 mmol/L (ref 135–145)
Total Bilirubin: 0.3 mg/dL (ref 0.0–1.2)
Total Protein: 6.9 g/dL (ref 6.5–8.1)

## 2023-12-12 LAB — D-DIMER, QUANTITATIVE: D-Dimer, Quant: 1.45 ug{FEU}/mL — ABNORMAL HIGH (ref 0.00–0.50)

## 2023-12-12 LAB — URINALYSIS, ROUTINE W REFLEX MICROSCOPIC
Bilirubin Urine: NEGATIVE
Glucose, UA: NEGATIVE mg/dL
Hgb urine dipstick: NEGATIVE
Ketones, ur: NEGATIVE mg/dL
Nitrite: NEGATIVE
Protein, ur: NEGATIVE mg/dL
Specific Gravity, Urine: 1.015 (ref 1.005–1.030)
pH: 7 (ref 5.0–8.0)

## 2023-12-12 LAB — URINALYSIS, MICROSCOPIC (REFLEX)

## 2023-12-12 LAB — TROPONIN T, HIGH SENSITIVITY
Troponin T High Sensitivity: <15 ng/L (ref 0–19)
Troponin T High Sensitivity: <15 ng/L (ref 0–19)

## 2023-12-12 LAB — LIPASE, BLOOD: Lipase: 17 U/L (ref 11–51)

## 2023-12-12 LAB — HCG, SERUM, QUALITATIVE: Preg, Serum: NEGATIVE

## 2023-12-12 MED ORDER — HYDROMORPHONE HCL 1 MG/ML IJ SOLN
1.0000 mg | Freq: Once | INTRAMUSCULAR | Status: AC
  Administered 2023-12-12: 1 mg via INTRAVENOUS
  Filled 2023-12-12: qty 1

## 2023-12-12 MED ORDER — FENTANYL CITRATE (PF) 50 MCG/ML IJ SOSY
50.0000 ug | PREFILLED_SYRINGE | Freq: Once | INTRAMUSCULAR | Status: AC
  Administered 2023-12-12: 50 ug via INTRAVENOUS
  Filled 2023-12-12: qty 1

## 2023-12-12 MED ORDER — FENTANYL CITRATE (PF) 50 MCG/ML IJ SOSY
100.0000 ug | PREFILLED_SYRINGE | Freq: Once | INTRAMUSCULAR | Status: AC
  Administered 2023-12-12: 100 ug via INTRAVENOUS
  Filled 2023-12-12: qty 2

## 2023-12-12 MED ORDER — ONDANSETRON HCL 4 MG/2ML IJ SOLN
4.0000 mg | Freq: Once | INTRAMUSCULAR | Status: AC
  Administered 2023-12-12: 4 mg via INTRAVENOUS
  Filled 2023-12-12: qty 2

## 2023-12-12 MED ORDER — SODIUM CHLORIDE 0.9 % IV BOLUS
1000.0000 mL | Freq: Once | INTRAVENOUS | Status: AC
  Administered 2023-12-12: 1000 mL via INTRAVENOUS

## 2023-12-12 MED ORDER — IOHEXOL 350 MG/ML SOLN
75.0000 mL | Freq: Once | INTRAVENOUS | Status: AC | PRN
  Administered 2023-12-12: 75 mL via INTRAVENOUS

## 2023-12-12 MED ORDER — FENTANYL CITRATE (PF) 50 MCG/ML IJ SOSY: 50.0000 ug | PREFILLED_SYRINGE | Freq: Once | INTRAMUSCULAR | Status: DC

## 2023-12-12 NOTE — ED Notes (Signed)
 Patient transported to CT

## 2023-12-12 NOTE — Discharge Instructions (Signed)
 You had a CT scan of your chest performed today that shows at 1.3 cm nodule on your lung that will need to have followed up with your family doctor.  You will need a repeat CT scan in 6-12 months.

## 2023-12-12 NOTE — ED Notes (Signed)
 Patient transported to X-ray

## 2023-12-13 LAB — BASIC METABOLIC PANEL WITH GFR
BUN/Creatinine Ratio: 16 (ref 9–23)
BUN: 18 mg/dL (ref 6–20)
CO2: 21 mmol/L (ref 20–29)
Calcium: 10.1 mg/dL (ref 8.7–10.2)
Chloride: 104 mmol/L (ref 96–106)
Creatinine, Ser: 1.12 mg/dL — ABNORMAL HIGH (ref 0.57–1.00)
Glucose: 110 mg/dL — ABNORMAL HIGH (ref 70–99)
Potassium: 4.3 mmol/L (ref 3.5–5.2)
Sodium: 141 mmol/L (ref 134–144)
eGFR: 65 mL/min/1.73 (ref >59)

## 2023-12-13 LAB — ALDOSTERONE + RENIN ACTIVITY W/ RATIO
Aldos/Renin Ratio: >47.3 — AB (ref 0.0–30.0)
Aldosterone: 7.9 ng/dL (ref 0.0–30.0)
Renin Activity, Plasma: <0.167 ng/mL/h — ABNORMAL LOW (ref 0.167–5.380)

## 2023-12-14 ENCOUNTER — Ambulatory Visit: Payer: Self-pay | Admitting: Internal Medicine

## 2023-12-14 NOTE — Telephone Encounter (Signed)
 I called the patient to talk to her about her abnormal aldosterone/renin results but I had to leave a voicemail to call back.   Her test was abnormal and may indicate that she has primary hyperaldosteronism which could be leading to her hypertension and she should discuss this with her endocrinologist for further workup. Please let me know when she calls back so we can discuss further.   Thank you, Emeline Calender, DO

## 2023-12-29 ENCOUNTER — Other Ambulatory Visit (HOSPITAL_BASED_OUTPATIENT_CLINIC_OR_DEPARTMENT_OTHER)

## 2024-01-12 ENCOUNTER — Ambulatory Visit (HOSPITAL_COMMUNITY)
Admission: RE | Admit: 2024-01-12 | Discharge: 2024-01-12 | Disposition: A | Discharge status: Home / Self Care | Source: Ambulatory Visit | Attending: Internal Medicine | Admitting: Internal Medicine

## 2024-01-12 DIAGNOSIS — R079 Chest pain, unspecified: Secondary | ICD-10-CM | POA: Insufficient documentation

## 2024-01-12 DIAGNOSIS — I10 Essential (primary) hypertension: Secondary | ICD-10-CM | POA: Diagnosis not present

## 2024-01-12 LAB — ECHOCARDIOGRAM COMPLETE
Area-P 1/2: 5.75 cm2
S' Lateral: 3 cm

## 2024-01-16 ENCOUNTER — Encounter: Payer: Self-pay | Admitting: Internal Medicine

## 2024-01-16 ENCOUNTER — Ambulatory Visit: Attending: Internal Medicine | Admitting: Internal Medicine

## 2024-01-16 VITALS — BP 135/91 | HR 90 | Ht 65.5 in | Wt 206.6 lb

## 2024-01-16 DIAGNOSIS — G4733 Obstructive sleep apnea (adult) (pediatric): Secondary | ICD-10-CM

## 2024-01-16 DIAGNOSIS — R072 Precordial pain: Secondary | ICD-10-CM

## 2024-01-16 DIAGNOSIS — D573 Sickle-cell trait: Secondary | ICD-10-CM | POA: Diagnosis not present

## 2024-01-16 DIAGNOSIS — E269 Hyperaldosteronism, unspecified: Secondary | ICD-10-CM | POA: Insufficient documentation

## 2024-01-16 DIAGNOSIS — I152 Hypertension secondary to endocrine disorders: Secondary | ICD-10-CM | POA: Diagnosis not present

## 2024-01-16 NOTE — Progress Notes (Signed)
 Cardiology Office Note   Date:  01/16/2024  ID:  [id number] [place], [place] 03-Nov-1985, MRN [id number] PCP: [facility]  [facility] Providers Cardiologist:  Emeline FORBES Calender, DO     History of Present Illness Leah Shea is a 38 y.o. female who follows at the [facility] with a past medical history of breast cancer s/p bilateral mastectomy in November 2024, sickle cell, postmastectomy chest wall pain and seroma, hyperprolactinemia and sellar mass, OSA, intermittently compliant with CPAP, hypertension, family history of premature CAD who was initially referred by Clayborne Molt, PA due to concern of hypertensive crisis with a blood pressure of 159/110.  At her last visit with me on 12/05/2023 she had noted elevated blood pressures with consistent pressures in the 150s/110s and on a prior ED visit had a systolic pressure in the 170s and was started on amlodipine 10 mg and lisinopril.  During that period of time she has noted chest discomfort, notably on exertion with sensation of acid reflux that goes away with rest.  She has a family history of premature CAD in mom who had an MI and stents in her 30s.  At her last visit she complained of left-sided chest discomfort that is typical of her postop chest discomfort.  A coronary CTA and echocardiogram were unremarkable.  An aldosterone/renin ratio was ordered and was found to be abnormal concerning for hyperaldosteronism which was brought up at her endocrinology visit with Dr. Donnice Havens on 12/18/2023.  At the aldosterone renin ratio was reordered which showed an aldosterone level of 16.7 and renin level of 0.19 (ratio 88).  Her endocrinologist appropriately switched lisinopril to spironolactone 25 mg with repeat BMP and plan for possible IR adrenal vein sampling, however patient did not see her MyChart messages and was not aware of these changes  Today she presents for 4-6 follow-up.  She has multiple nonspecific complaints  including nocturia, polydipsia, paresthesias and persistent chest pain.   Tobacco use: No Alcohol use: No Diet mainly consists of: Bland diet and tries to limit salt and fried foods due to food aversion Pertinent family history:  Mother type 2 diabetes and hypertension, MI at 61 with multiple stents Brother brain neoplasm Maternal grandfather type 2 diabetes and hypertension Maternal aunt breast cancer Maternal grandmother breast cancer    ROS:  Review of Systems  All other systems reviewed and are negative.   Physical Exam  Physical Exam Vitals and nursing note reviewed.  Constitutional:      Appearance: Normal appearance.  HENT:     Head: Normocephalic and atraumatic.  Eyes:     Conjunctiva/sclera: Conjunctivae normal.  Cardiovascular:     Rate and Rhythm: Normal rate and regular rhythm.  Pulmonary:     Effort: Pulmonary effort is normal.     Breath sounds: Normal breath sounds.  Musculoskeletal:        General: No swelling or tenderness.  Skin:    Coloration: Skin is not jaundiced or pale.  Neurological:     Mental Status: She is alert.     VS:  BP (!) 135/91   Pulse 90   Ht 5' 5.5 (1.664 m)   Wt 206 lb 9.6 oz (93.7 kg)   SpO2 96%   BMI 33.86 kg/m         Wt Readings from Last 3 Encounters:  01/16/24 206 lb 9.6 oz (93.7 kg)  12/11/23 201 lb (91.2 kg)  12/11/23 201 lb (91.2 kg)  EKG Interpretation Date/Time:    Ventricular Rate:    PR Interval:    QRS Duration:    QT Interval:    QTC Calculation:   R Axis:      Text Interpretation:      Studies Reviewed   Coronary CTA Calcium  score: 0  Echocardiogram 01/12/2024 Normal     Risk Assessment/Calculations   ASCVD risk score: The ASCVD Risk score (Arnett DK, et al., 2019) failed to calculate for the following reasons:   The 2019 ASCVD risk score is only valid for ages 60 to 30   ASSESSMENT  Hypertension, 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  Was supposed to have changed lisinopril to spironolactone 25 mg with repeat BMP in outpatient IR adrenal vein sampling as managed by Dr. Dale, endocrinology, however patient was unaware of these changes as she did not see her MyChart until today in the office.  CT renal stone study from 08/30/2023 shows normal adrenal glands bilaterally Noncardiac chest pain coronary CTA and echocardiogram are unremarkable as above Breast mass s/p bilateral mastectomy  Pituitary mass with hyperprolactinemia follows with endocrinology Migraines on triptans OSA now compliant with CPAP   Plan  She was advised to use her CPAP machine consistently I agree with endocrinology's recommendations which I personally reviewed as noted from the patient's MyChart messages and in care everywhere: Stop lisinopril and start spironolactone 25 mg (will start today) and plan for BMP in 2 weeks as well as outpatient IR guided adrenal vein sampling.  Will defer further management to the patient's endocrinologist  Follow up: 6 months          Signed, Emeline FORBES Calender, DO

## 2024-01-16 NOTE — Patient Instructions (Addendum)
 Medication Instructions:   STOP Lisinopril (zestril) 40 mg tablet  *If you need a refill on your cardiac medications before your next appointment, please call your pharmacy*   Follow-Up: At [facility], you and your health needs are our priority.  As part of our continuing mission to provide you with exceptional heart care, our providers are all part of one team.  This team includes your primary Cardiologist (physician) and Advanced Practice Providers or APPs (Physician Assistants and Nurse Practitioners) who all work together to provide you with the care you need, when you need it.  Your next appointment:   6 month(s)  Provider:   Emeline FORBES Calender, DO

## 2024-02-19 NOTE — Progress Notes (Signed)
 Leah Shea is a 39 year old female with primary hyperaldosteronism.  She is referred by Dr. Adina Havens for adrenal vein sampling.  Early in 2025 she began to develop difficult to control hypertension.  She was started on antihypertensive medications over the summer.  Despite being on 2 medications her blood pressure was difficult to manage.  Ultimately in September she was admitted for a sickle cell crisis.  At that time her aldosterone and renin levels were checked.  Her renin level was found to be undetectable at 0.67 with an aldosterone of 7.9 and an aldosterone to renin ratio 47.  She has had prior metabolic panels which have demonstrated hypokalemia.  After her admission for sickle cell crisis her renin levels and aldosterone levels were checked again in November.  At that time her renin level was found to be 0.19 and aldosterone was 16.7.  This represents an aldosterone to renin ratio of 88.  She does have an outside CT examination from Jun 24, 2023.  It is not an adrenal nodule protocol. Given this limitation, both adrenal glands are normal in appearance.   Vitals:   02/19/24 1343  BP: (!) 129/96  Pulse:   Resp:   Temp:   SpO2:    Assessment and plan:  1.  Hyperaldosteronism: I reviewed the pathophysiology of hyperaldosteronism and the role of adrenal vein sampling with the patient today in clinic.  I discussed the technique of the procedure as well as potential risks including injury to the common femoral vein, injury to the adrenal veins, contrast-induced nephrotoxicity, bleeding, nondiagnostic test result.  At this time she would like to proceed with adrenal vein sampling.  I have her scheduled to have the procedure performed under general anesthesia at 7 AM on March 12, 2024.  We initially discussed doing the procedure with moderate sedation but given her significant anxiety she stated she would prefer general anesthesia.  I will then call her with the results approximately 2 weeks  later.  I have personally spent 45 minutes involved in face-to-face and non-face-to-face activities for this patient on the day of the visit.  Professional time spent includes the following activities, in addition to those noted in the documentation: reviewing records and communicating with referring physicians.

## 2024-02-23 ENCOUNTER — Emergency Department (HOSPITAL_COMMUNITY)

## 2024-02-23 ENCOUNTER — Emergency Department (HOSPITAL_COMMUNITY)
Admission: EM | Admit: 2024-02-23 | Discharge: 2024-02-24 | Disposition: A | Discharge status: Home / Self Care | Attending: Emergency Medicine | Admitting: Emergency Medicine

## 2024-02-23 ENCOUNTER — Encounter (HOSPITAL_COMMUNITY): Payer: Self-pay

## 2024-02-23 DIAGNOSIS — R5383 Other fatigue: Secondary | ICD-10-CM | POA: Insufficient documentation

## 2024-02-23 DIAGNOSIS — R519 Headache, unspecified: Secondary | ICD-10-CM | POA: Diagnosis not present

## 2024-02-23 DIAGNOSIS — R0602 Shortness of breath: Secondary | ICD-10-CM | POA: Insufficient documentation

## 2024-02-23 DIAGNOSIS — R002 Palpitations: Secondary | ICD-10-CM | POA: Insufficient documentation

## 2024-02-23 DIAGNOSIS — R42 Dizziness and giddiness: Secondary | ICD-10-CM | POA: Insufficient documentation

## 2024-02-23 DIAGNOSIS — R197 Diarrhea, unspecified: Secondary | ICD-10-CM | POA: Diagnosis not present

## 2024-02-23 DIAGNOSIS — R110 Nausea: Secondary | ICD-10-CM | POA: Insufficient documentation

## 2024-02-23 DIAGNOSIS — R079 Chest pain, unspecified: Secondary | ICD-10-CM | POA: Diagnosis not present

## 2024-02-23 LAB — URINALYSIS, ROUTINE W REFLEX MICROSCOPIC
Bilirubin Urine: NEGATIVE
Glucose, UA: NEGATIVE mg/dL
Hgb urine dipstick: NEGATIVE
Ketones, ur: 5 mg/dL — AB
Leukocytes,Ua: NEGATIVE
Nitrite: NEGATIVE
Protein, ur: NEGATIVE mg/dL
Specific Gravity, Urine: 1.017 (ref 1.005–1.030)
pH: 5 (ref 5.0–8.0)

## 2024-02-23 LAB — PREGNANCY, URINE: Preg Test, Ur: NEGATIVE

## 2024-02-23 LAB — BASIC METABOLIC PANEL WITH GFR
Anion gap: 16 — ABNORMAL HIGH (ref 5–15)
BUN: 13 mg/dL (ref 6–20)
CO2: 18 mmol/L — ABNORMAL LOW (ref 22–32)
Calcium: 9.9 mg/dL (ref 8.9–10.3)
Chloride: 105 mmol/L (ref 98–111)
Creatinine, Ser: 0.91 mg/dL (ref 0.44–1.00)
GFR, Estimated: >60 mL/min
Glucose, Bld: 108 mg/dL — ABNORMAL HIGH (ref 70–99)
Potassium: 4 mmol/L (ref 3.5–5.1)
Sodium: 139 mmol/L (ref 135–145)

## 2024-02-23 LAB — CBC WITH DIFFERENTIAL/PLATELET
Abs Immature Granulocytes: 0.03 K/uL (ref 0.00–0.07)
Basophils Absolute: 0.1 K/uL (ref 0.0–0.1)
Basophils Relative: 1 %
Eosinophils Absolute: 0.1 K/uL (ref 0.0–0.5)
Eosinophils Relative: 1 %
HCT: 37.3 % (ref 36.0–46.0)
Hemoglobin: 13 g/dL (ref 12.0–15.0)
Immature Granulocytes: 0 %
Lymphocytes Relative: 38 %
Lymphs Abs: 2.9 K/uL (ref 0.7–4.0)
MCH: 28.8 pg (ref 26.0–34.0)
MCHC: 34.9 g/dL (ref 30.0–36.0)
MCV: 82.5 fL (ref 80.0–100.0)
Monocytes Absolute: 0.4 K/uL (ref 0.1–1.0)
Monocytes Relative: 6 %
Neutro Abs: 4.2 K/uL (ref 1.7–7.7)
Neutrophils Relative %: 54 %
Platelets: 300 K/uL (ref 150–400)
RBC: 4.52 MIL/uL (ref 3.87–5.11)
RDW: 12.3 % (ref 11.5–15.5)
WBC: 7.7 K/uL (ref 4.0–10.5)
nRBC: 0 % (ref 0.0–0.2)

## 2024-02-23 LAB — RESP PANEL BY RT-PCR (RSV, FLU A&B, COVID)  RVPGX2
Influenza A by PCR: NEGATIVE
Influenza B by PCR: NEGATIVE
Resp Syncytial Virus by PCR: NEGATIVE
SARS Coronavirus 2 by RT PCR: NEGATIVE

## 2024-02-23 LAB — TROPONIN T, HIGH SENSITIVITY
Troponin T High Sensitivity: <15 ng/L (ref 0–19)
Troponin T High Sensitivity: <15 ng/L (ref 0–19)

## 2024-02-23 LAB — RETICULOCYTES
Immature Retic Fract: 8.6 % (ref 2.3–15.9)
RBC.: 4.54 MIL/uL (ref 3.87–5.11)
Retic Count, Absolute: 94 K/uL (ref 19.0–186.0)
Retic Ct Pct: 2.1 % (ref 0.4–3.1)

## 2024-02-23 LAB — D-DIMER, QUANTITATIVE: D-Dimer, Quant: 0.62 ug{FEU}/mL — ABNORMAL HIGH (ref 0.00–0.50)

## 2024-02-23 MED ORDER — LACTATED RINGERS IV BOLUS
1000.0000 mL | Freq: Once | INTRAVENOUS | Status: AC
  Administered 2024-02-24: 1000 mL via INTRAVENOUS

## 2024-02-23 MED ORDER — OXYCODONE-ACETAMINOPHEN 5-325 MG PO TABS
1.0000 | ORAL_TABLET | Freq: Once | ORAL | Status: AC
  Administered 2024-02-23: 1 via ORAL
  Filled 2024-02-23: qty 1

## 2024-02-23 MED ORDER — ONDANSETRON 4 MG PO TBDP
8.0000 mg | ORAL_TABLET | Freq: Once | ORAL | Status: AC
  Administered 2024-02-23: 8 mg via ORAL
  Filled 2024-02-23: qty 2

## 2024-02-23 MED ORDER — IOHEXOL 350 MG/ML SOLN
80.0000 mL | Freq: Once | INTRAVENOUS | Status: AC | PRN
  Administered 2024-02-23: 80 mL via INTRAVENOUS

## 2024-02-23 MED ORDER — ONDANSETRON HCL 4 MG/2ML IJ SOLN
4.0000 mg | Freq: Once | INTRAMUSCULAR | Status: AC
  Administered 2024-02-24: 4 mg via INTRAVENOUS
  Filled 2024-02-23: qty 2

## 2024-02-23 MED ORDER — HYDROMORPHONE HCL 1 MG/ML IJ SOLN
0.5000 mg | Freq: Once | INTRAMUSCULAR | Status: AC
  Administered 2024-02-24: 0.5 mg via INTRAVENOUS
  Filled 2024-02-23: qty 1

## 2024-02-23 NOTE — ED Notes (Signed)
 Both EKG's done on pt did not transmit over to epic.  [employer] will troubleshoot EKG machine. I will notify edp.

## 2024-02-23 NOTE — ED Provider Triage Note (Signed)
 Emergency Medicine Provider Triage Evaluation Note  Leah Shea , a 39 y.o. female  was evaluated in triage.  Pt complains of history of hypertension, sickle cell trait, hyperaldosteronism.  Dents today for several days of chest pain, shortness of breath, back pain, tachycardia, nausea, and diarrhea  Review of Systems  Positive: chest pain, shortness of breath, back pain, tachycardia, nausea, and diarrhea Negative: Fever, chills, cough, congestion  Physical Exam  BP (!) 136/95 (BP Location: Right Arm)   Pulse (!) 110   Temp 98.1 F (36.7 C)   Resp (!) 26   SpO2 98%  Gen:   Awake, uncomfortable appearing Resp:  Increased effort and tachypneic, CTAB MSK:   Moves extremities without difficulty  Other:  Tearful  Medical Decision Making  Medically screening exam initiated at 2:21 PM.  Appropriate orders placed.  Leah Shea was informed that the remainder of the evaluation will be completed by another provider, this initial triage assessment does not replace that evaluation, and the importance of remaining in the ED until their evaluation is complete.  Labs and imaging ordered   Leah Shea 02/23/24 1423

## 2024-02-23 NOTE — ED Triage Notes (Signed)
 Pt arrives via POV. Pt c/o chest pain, sob, fatigue, and headache for the past the 2 days. Pt is AxOx4. She is tachypnic during triage.

## 2024-02-23 NOTE — ED Provider Notes (Signed)
" °  [facility]ER[facility]ENC[facility] DEPT [facility] Emergency Department Provider Note MRN:  [id number]  Arrival date & time: 02/23/2024     Chief Complaint   Headache, Shortness of Breath, and Fatigue   History of Present Illness   Leah Shea is a 39 y.o. year-old female presents to the ED with chief complaint of SOB.  States that she has been having worsening SOB over the past few days.  States that she .  {rbhistorian:27070} {RB interpreter (Optional):27221}  Review of Systems  Pertinent positive and negative review of systems noted in HPI.    Physical Exam   Vitals:   02/23/24 2036 02/23/24 2229  BP: (!) 143/97 (!) 155/110  Pulse: (!) 102 (!) 108  Resp: (!) 22 (!) 25  Temp: 98.1 F (36.7 C)   SpO2: 100% 100%    CONSTITUTIONAL:  ***-appearing, NAD NEURO:  Alert and oriented x 3, CN 3-12 grossly intact*** EYES:  eyes equal and reactive ENT/NECK:  Supple, no stridor *** CARDIO:  ***, ***regular rhythm, appears well-perfused *** PULM:  No respiratory distress***, *** GI/GU:  non-distended, *** MSK/SPINE:  No gross deformities, no edema, moves all extremities *** SKIN:  no rash, ***, atraumatic   *Additional and/or pertinent findings included in MDM below  Diagnostic and Interventional Summary    EKG Interpretation Date/Time:    Ventricular Rate:    PR Interval:    QRS Duration:    QT Interval:    QTC Calculation:   R Axis:      Text Interpretation:        *** Labs Reviewed  BASIC METABOLIC PANEL WITH GFR - Abnormal; Notable for the following components:      Result Value   CO2 18 (*)    Glucose, Bld 108 (*)    Anion gap 16 (*)    All other components within normal limits  D-DIMER, QUANTITATIVE - Abnormal; Notable for the following components:   D-Dimer, Quant 0.62 (*)    All other components within normal limits  URINALYSIS, ROUTINE W REFLEX MICROSCOPIC - Abnormal; Notable for the following components:   APPearance HAZY (*)    Ketones, ur 5 (*)    All  other components within normal limits  RESP PANEL BY RT-PCR (RSV, FLU A&B, COVID)  [id number]  CBC WITH DIFFERENTIAL/PLATELET  PREGNANCY, URINE  RETICULOCYTES  TROPONIN T, HIGH SENSITIVITY  TROPONIN T, HIGH SENSITIVITY    DG Chest 2 View  Final Result      Medications  oxyCODONE -acetaminophen  (PERCOCET/ROXICET) 5-325 MG per tablet 1 tablet (1 tablet Oral Given 02/23/24 1454)  ondansetron  (ZOFRAN -ODT) disintegrating tablet 8 mg (8 mg Oral Given 02/23/24 1454)     Procedures  /  Critical Care Procedures  ED Course and Medical Decision Making  I have reviewed the triage vital signs, the nursing notes, and pertinent available records from the EMR.  Social Determinants Affecting Complexity of Care: Patient {rbSocial Determinants:27067}. {rbsocialsolutions:27068}  ED Course:    Medical Decision Making     {rbcpddx (Optional):29772:::1} {rbabdddx (Optional):29773:s::1}  Consultants: {rbconsultants:27072}   Treatment and Plan: {rbadmissionvdc:27069}  {rbattending:27073}  Final Clinical Impressions(s) / ED Diagnoses  No diagnosis found.  ED Discharge Orders     None         Discharge Instructions Discussed with and Provided to Patient:   Discharge Instructions   None    "

## 2024-02-24 NOTE — ED Notes (Signed)
 Patient given discharge and follow-up instructions. Patient also educated on making sure all of her care providers have all information from ER visits. Patient verbalized understanding. Patient left via [place] to home.

## 2024-02-24 NOTE — Discharge Instructions (Signed)
 Your ER tests looked good.  No certain cause of your symptoms was found tonight.  We recommend that your follow-up with your regular care team and with your cardiologist for further evaluation and treatment.
